# Patient Record
Sex: Male | Born: 1980
Health system: Southern US, Community
[De-identification: ages and names within clinical notes are randomized; demographics above are authoritative.]

## PROBLEM LIST (undated history)

## (undated) DIAGNOSIS — C801 Malignant (primary) neoplasm, unspecified: Secondary | ICD-10-CM

## (undated) DIAGNOSIS — M1990 Unspecified osteoarthritis, unspecified site: Secondary | ICD-10-CM

## (undated) DIAGNOSIS — I509 Heart failure, unspecified: Secondary | ICD-10-CM

## (undated) DIAGNOSIS — K219 Gastro-esophageal reflux disease without esophagitis: Secondary | ICD-10-CM

## (undated) DIAGNOSIS — Q2112 Patent foramen ovale: Secondary | ICD-10-CM

## (undated) DIAGNOSIS — E662 Morbid (severe) obesity with alveolar hypoventilation: Secondary | ICD-10-CM

## (undated) DIAGNOSIS — G4733 Obstructive sleep apnea (adult) (pediatric): Secondary | ICD-10-CM

## (undated) DIAGNOSIS — G4730 Sleep apnea, unspecified: Secondary | ICD-10-CM

## (undated) DIAGNOSIS — N189 Chronic kidney disease, unspecified: Secondary | ICD-10-CM

## (undated) DIAGNOSIS — I10 Essential (primary) hypertension: Secondary | ICD-10-CM

## (undated) DIAGNOSIS — J189 Pneumonia, unspecified organism: Secondary | ICD-10-CM

## (undated) HISTORY — DX: Gastro-esophageal reflux disease without esophagitis: K21.9

## (undated) HISTORY — PX: RADIOFREQUENCY ABLATION KIDNEY: SHX2292

## (undated) HISTORY — DX: Sleep apnea, unspecified: G47.30

## (undated) HISTORY — DX: Essential (primary) hypertension: I10

## (undated) HISTORY — DX: Morbid (severe) obesity with alveolar hypoventilation: E66.2

## (undated) HISTORY — PX: CARDIAC CATHETERIZATION: SHX172

## (undated) HISTORY — DX: Obstructive sleep apnea (adult) (pediatric): G47.33

---
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
## 2000-06-03 ENCOUNTER — Emergency Department (HOSPITAL_COMMUNITY): Admission: EM | Admit: 2000-06-03 | Discharge: 2000-06-03 | Payer: Self-pay | Admitting: Emergency Medicine

## 2007-01-25 ENCOUNTER — Emergency Department (HOSPITAL_COMMUNITY): Admission: EM | Admit: 2007-01-25 | Discharge: 2007-01-25 | Payer: Self-pay | Admitting: Family Medicine

## 2007-07-21 ENCOUNTER — Emergency Department (HOSPITAL_COMMUNITY): Admission: EM | Admit: 2007-07-21 | Discharge: 2007-07-21 | Payer: Self-pay | Admitting: Family Medicine

## 2007-09-23 ENCOUNTER — Emergency Department (HOSPITAL_COMMUNITY): Admission: EM | Admit: 2007-09-23 | Discharge: 2007-09-23 | Payer: Self-pay | Admitting: Emergency Medicine

## 2007-09-23 IMAGING — CT CT HEAD W/O CM
1 series · 16 of 30 positions shown, 20 images · non-contrast
Comparison: None.

CLINICAL DATA: Left periorbital and frontal headaches. Hypertension.

CRANIAL CT WITHOUT CONTRAST  [DATE]:
TECHNIQUE: 5mm axial images were obtained from the skull base through the
vertex without intravenous contrast.

[Series 2: headseq 4.8 h45s · axial · 0.45mm/px · z∈[-189,-35]mm · 16 of 36 slices shown, 20 images]
[im 2/36  brain]
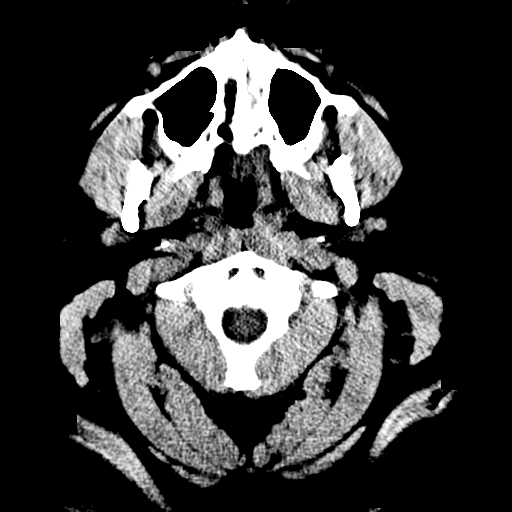
[im 2/36  bone]
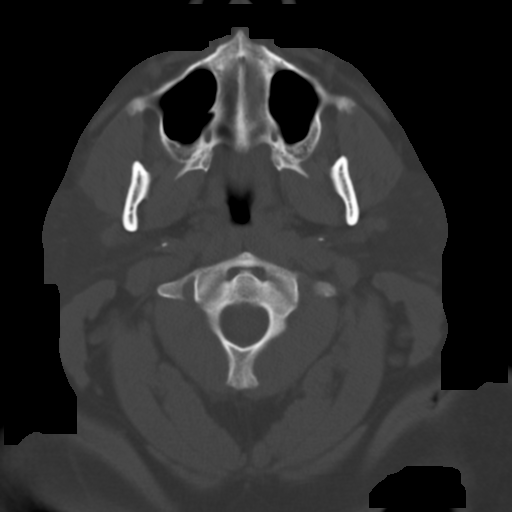
[im 4/36  brain]
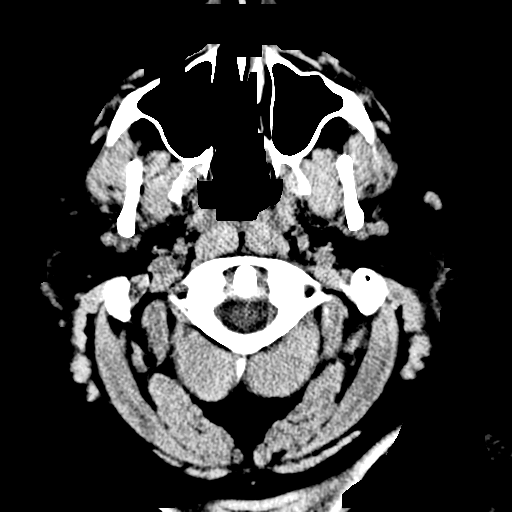
[im 7/36  brain]
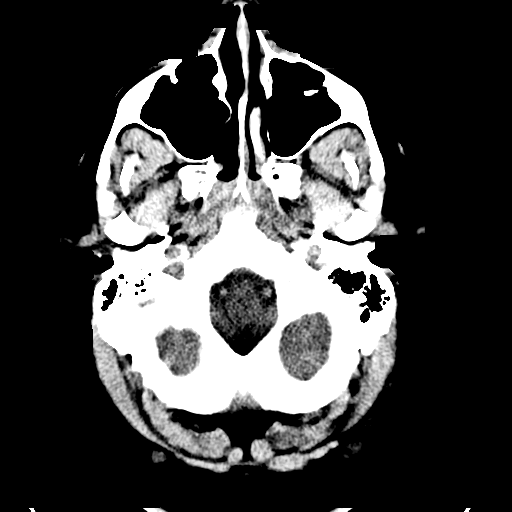
[im 9/36  brain]
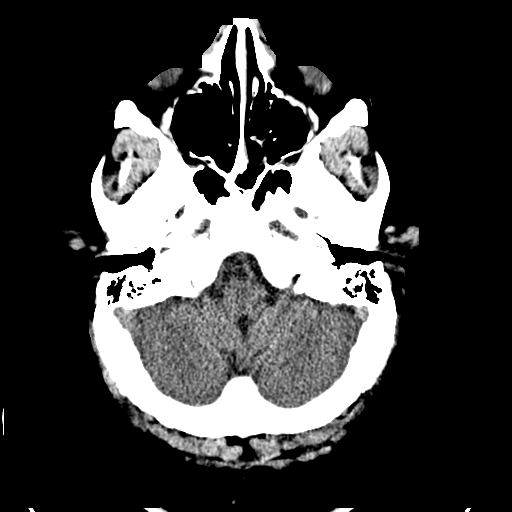
[im 10/36  brain]
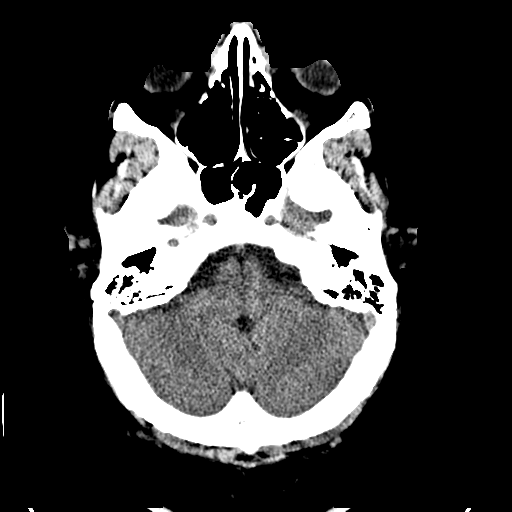
[im 10/36  bone]
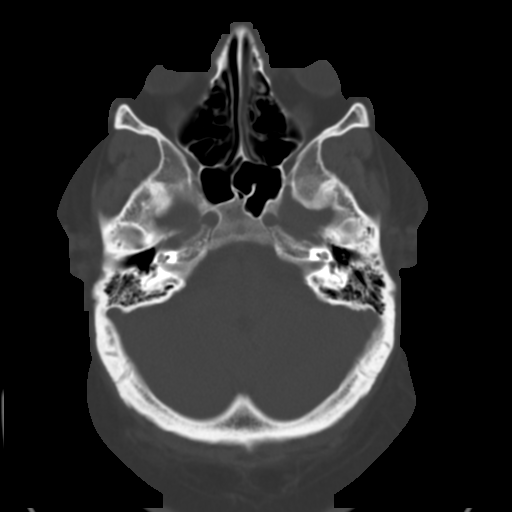
[im 13/36  brain]
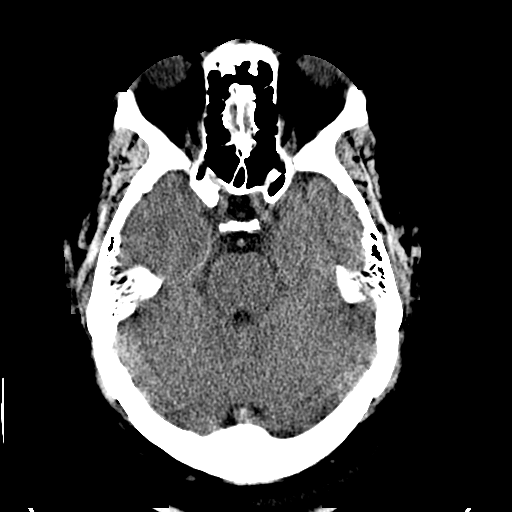
[im 15/36  brain]
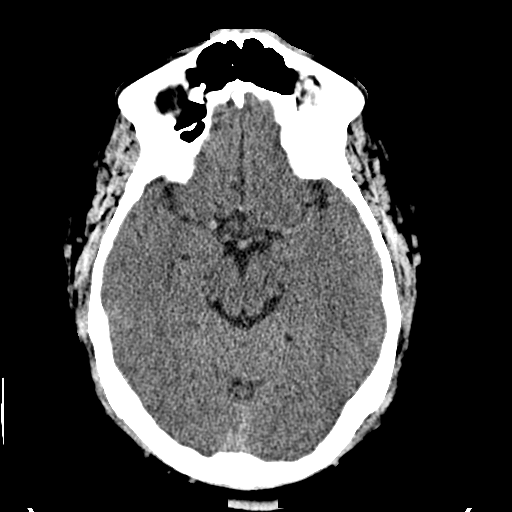
[im 17/36  brain]
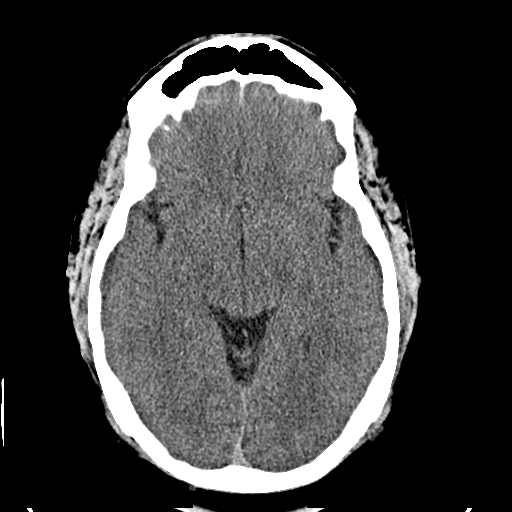
[im 19/36  brain]
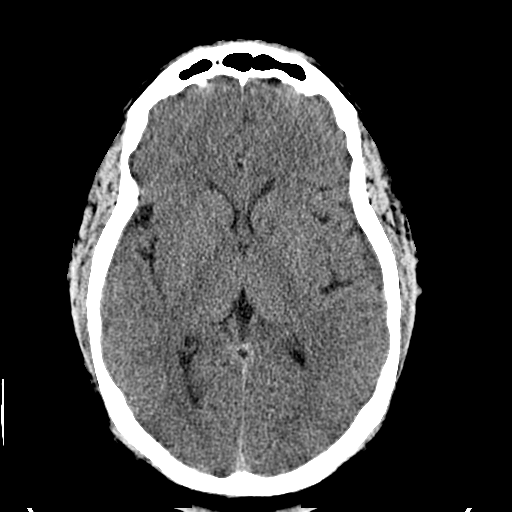
[im 19/36  bone]
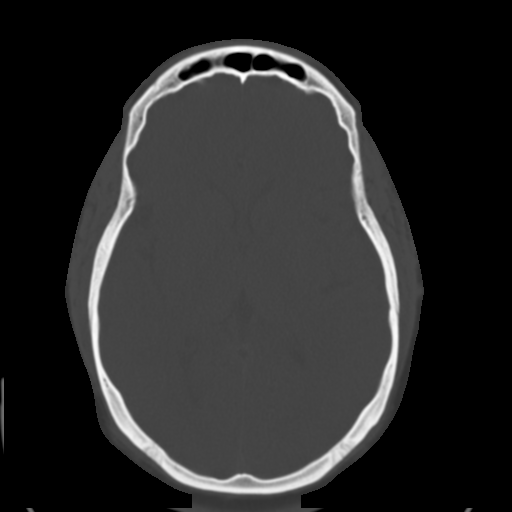
[im 21/36  brain]
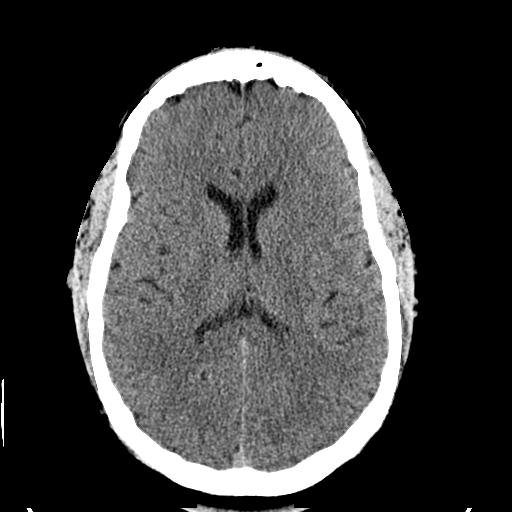
[im 23/36  brain]
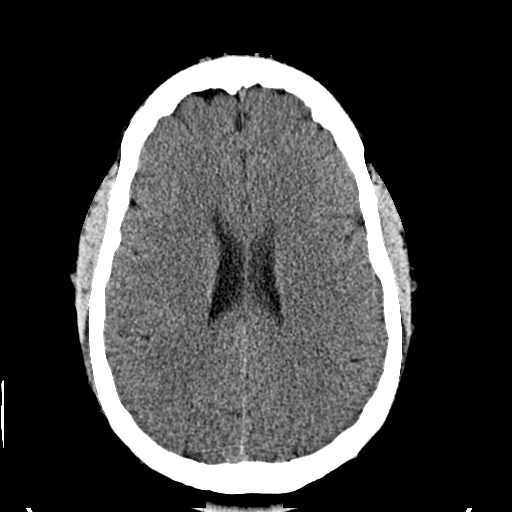
[im 26/36  brain]
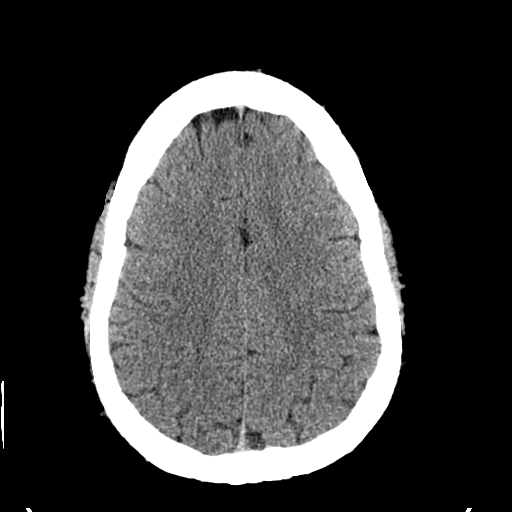
[im 27/36  brain]
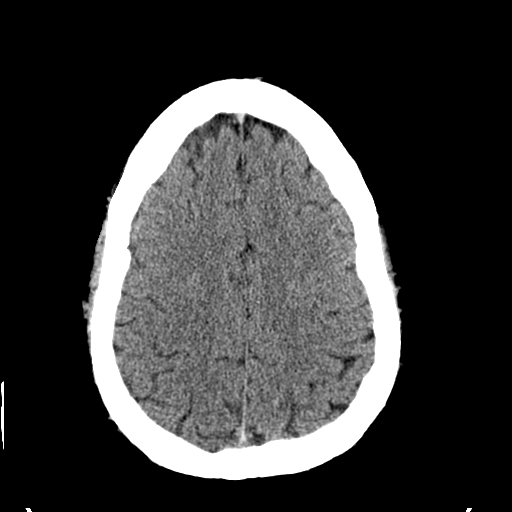
[im 27/36  bone]
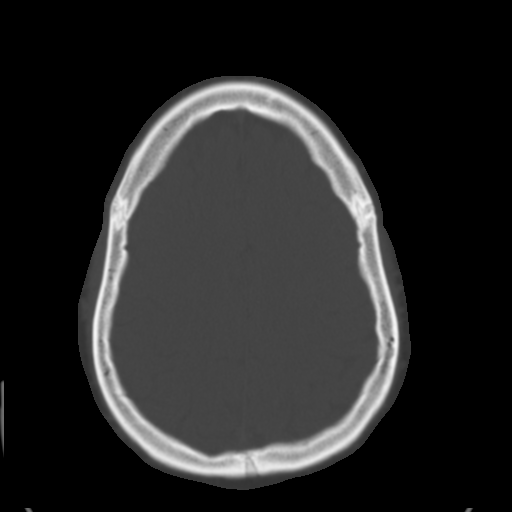
[im 29/36  brain]
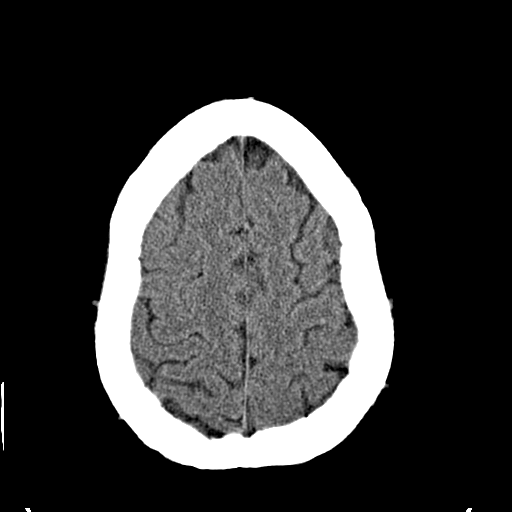
[im 32/36  brain]
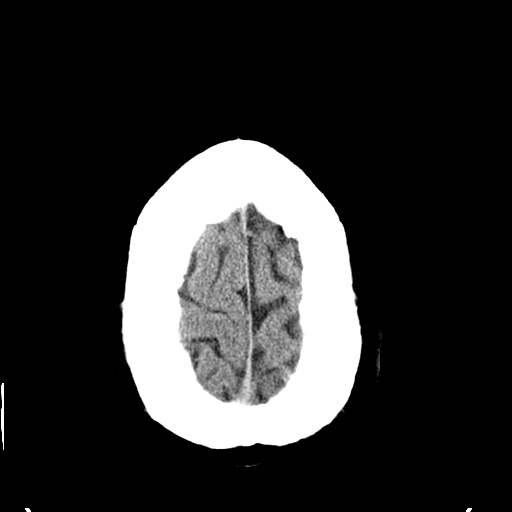
[im 34/36  brain]
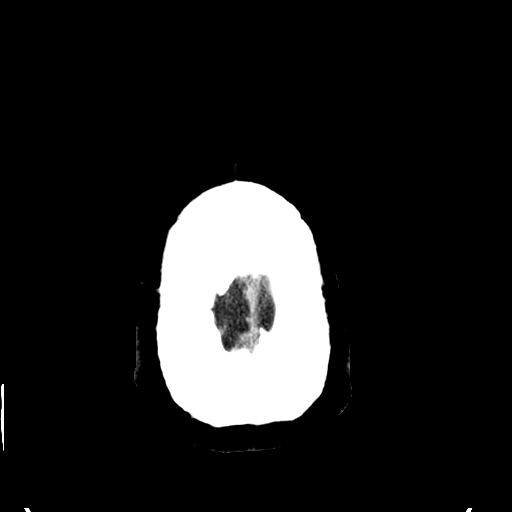

[16 of 30 positions shown; findings below may reference images not displayed]

FINDINGS: Ventricular system normal in size and appearance for age. No
intra-axial mass lesions or midline shift. No acute hemorrhage or hematoma. No
extra-axial fluid collections. No evidence of acute stroke or other focal brain
parenchymal abnormalities.

Approximately 14 mm calcified meningioma involving the inner table of the left
frontal bone near the vertex. No focal osseous abnormalities involving the
skull. Visualized paranasal sinuses and mastoid air cells well-aerated.
IMPRESSION: 1. No acute or significant intracranial abnormalities.
2. Incidental calcified 14 mm left frontal meningioma without associated
mass-effect.

## 2008-09-27 ENCOUNTER — Emergency Department (HOSPITAL_COMMUNITY): Admission: EM | Admit: 2008-09-27 | Discharge: 2008-09-27 | Payer: Self-pay | Admitting: Family Medicine

## 2008-09-27 IMAGING — CR DG CHEST 2V
2 series · 2 of 2 positions shown · non-contrast
Comparison: None

CLINICAL DATA: Cough, fever and chills.

CHEST - 2 VIEW

[view not recorded (1 of 2)]
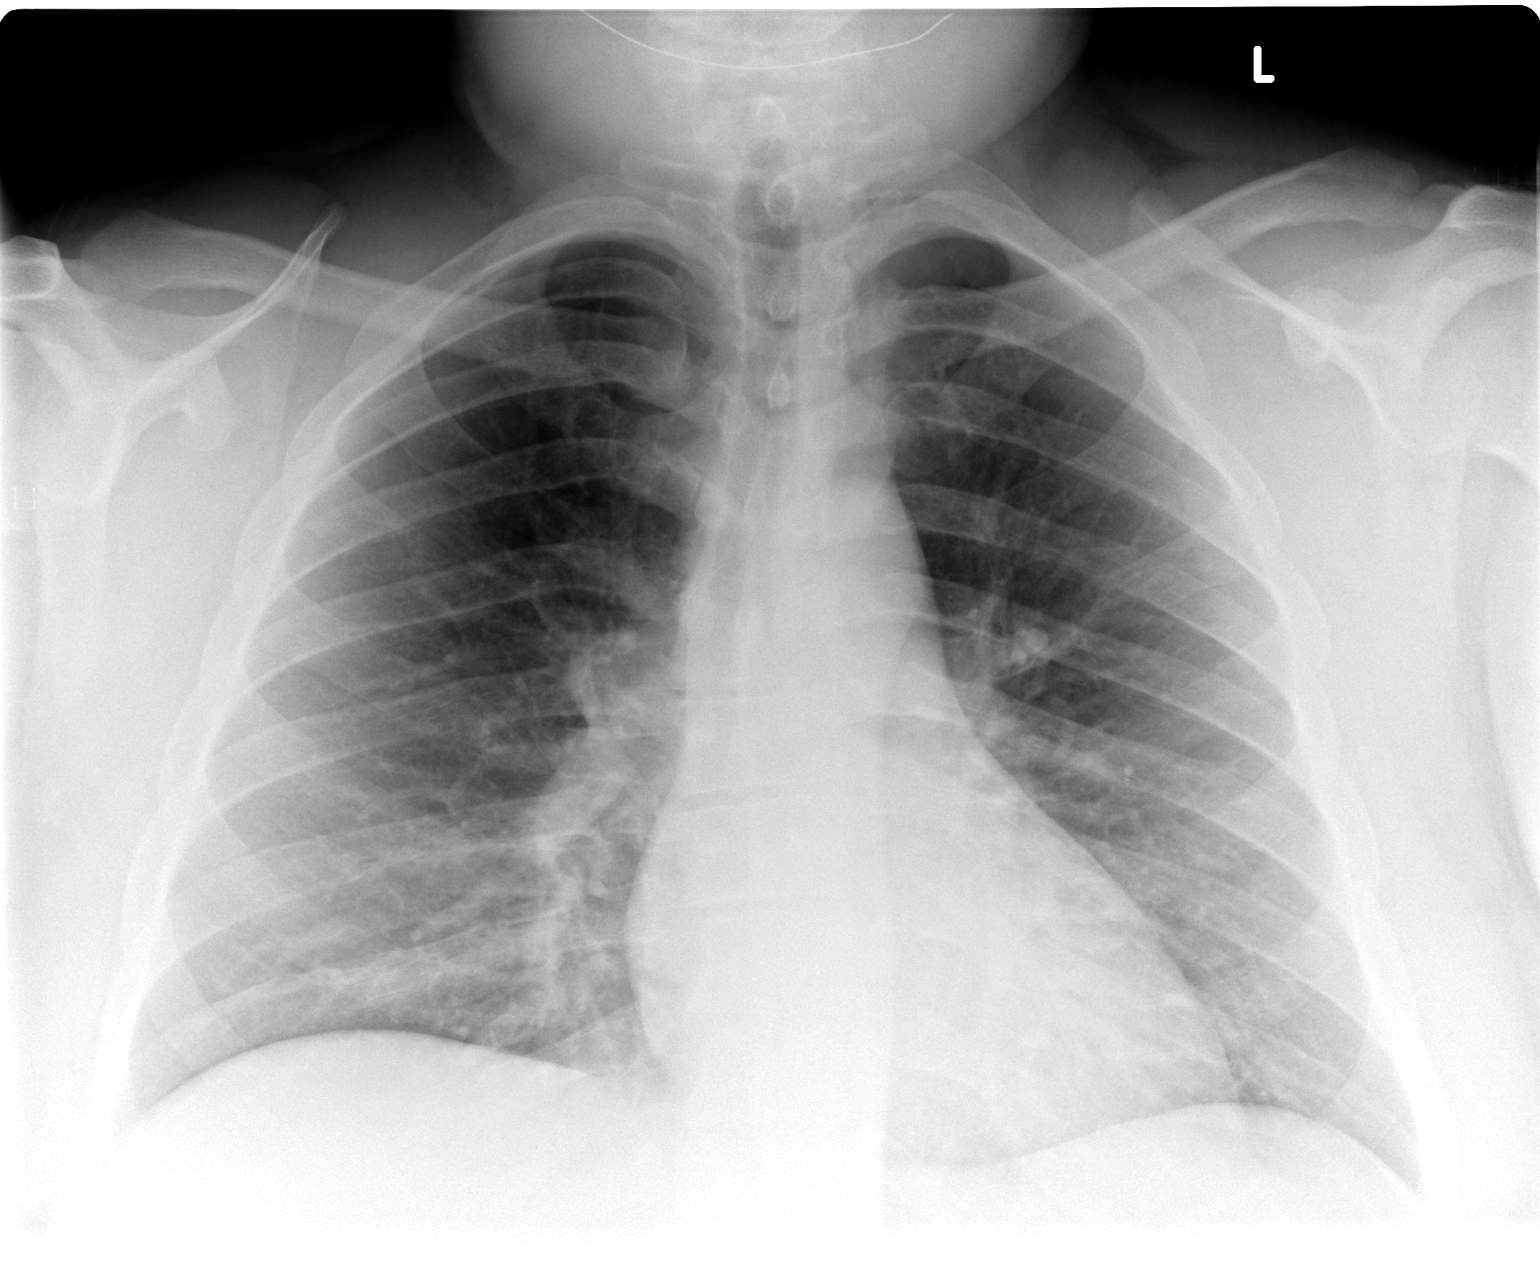

[view not recorded (2 of 2)]
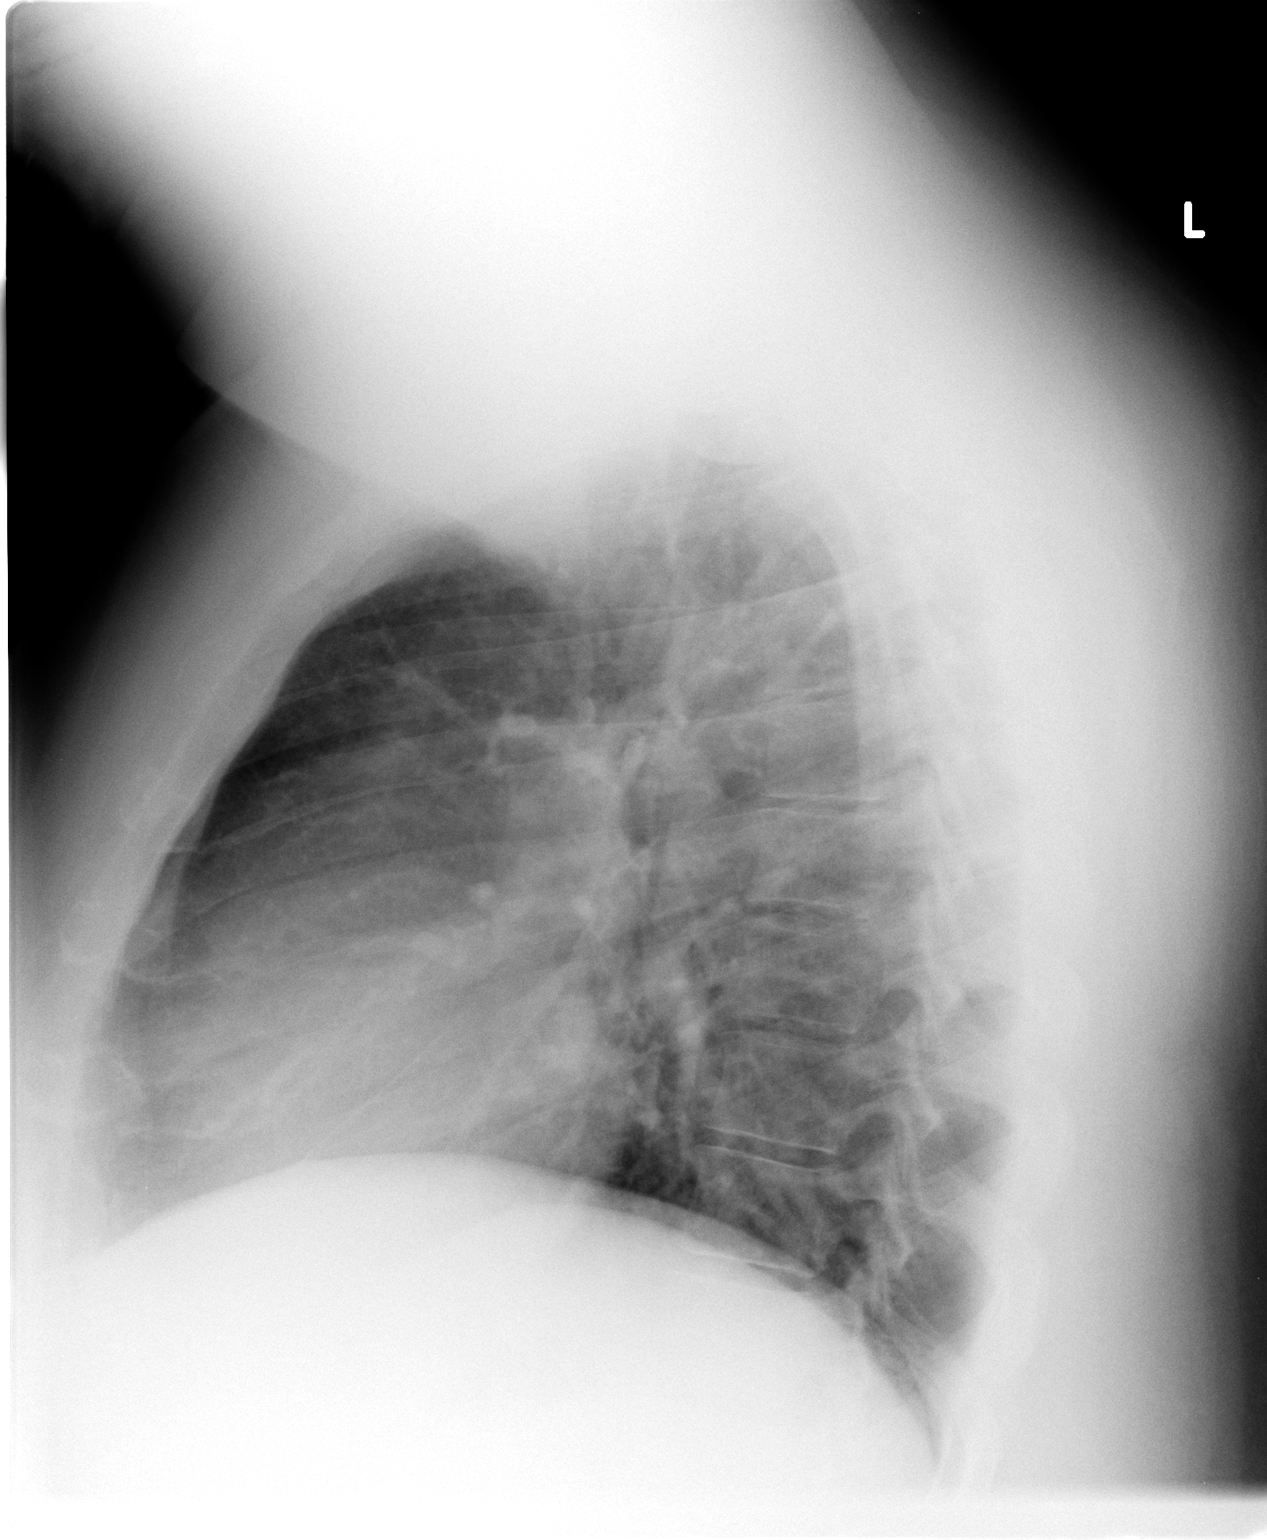

[2 of 2 positions shown; findings below may reference images not displayed]

FINDINGS: There is asymmetric density at the right lung base
representing either atelectasis or infiltrate.  No edema or pleural
effusion is identified.  Heart size is normal.
IMPRESSION: Right lower lobe atelectasis versus infiltrate.

## 2009-04-07 ENCOUNTER — Emergency Department (HOSPITAL_COMMUNITY): Admission: EM | Admit: 2009-04-07 | Discharge: 2009-04-07 | Payer: Self-pay | Admitting: Emergency Medicine

## 2009-07-21 ENCOUNTER — Ambulatory Visit: Payer: Self-pay | Admitting: Family Medicine

## 2009-07-21 DIAGNOSIS — R519 Headache, unspecified: Secondary | ICD-10-CM | POA: Insufficient documentation

## 2009-07-21 DIAGNOSIS — R51 Headache: Secondary | ICD-10-CM | POA: Insufficient documentation

## 2009-07-21 DIAGNOSIS — I10 Essential (primary) hypertension: Secondary | ICD-10-CM | POA: Insufficient documentation

## 2010-03-03 ENCOUNTER — Emergency Department (HOSPITAL_COMMUNITY): Admission: EM | Admit: 2010-03-03 | Discharge: 2010-03-03 | Payer: Self-pay | Admitting: Emergency Medicine

## 2010-03-03 IMAGING — CR DG CHEST 2V
2 series · 2 of 2 positions shown · non-contrast
Comparison: Chest x-ray of [DATE]

CLINICAL DATA: Cough, spitting up blood for several days, smoking
history

CHEST - 2 VIEW

[w chest pa]
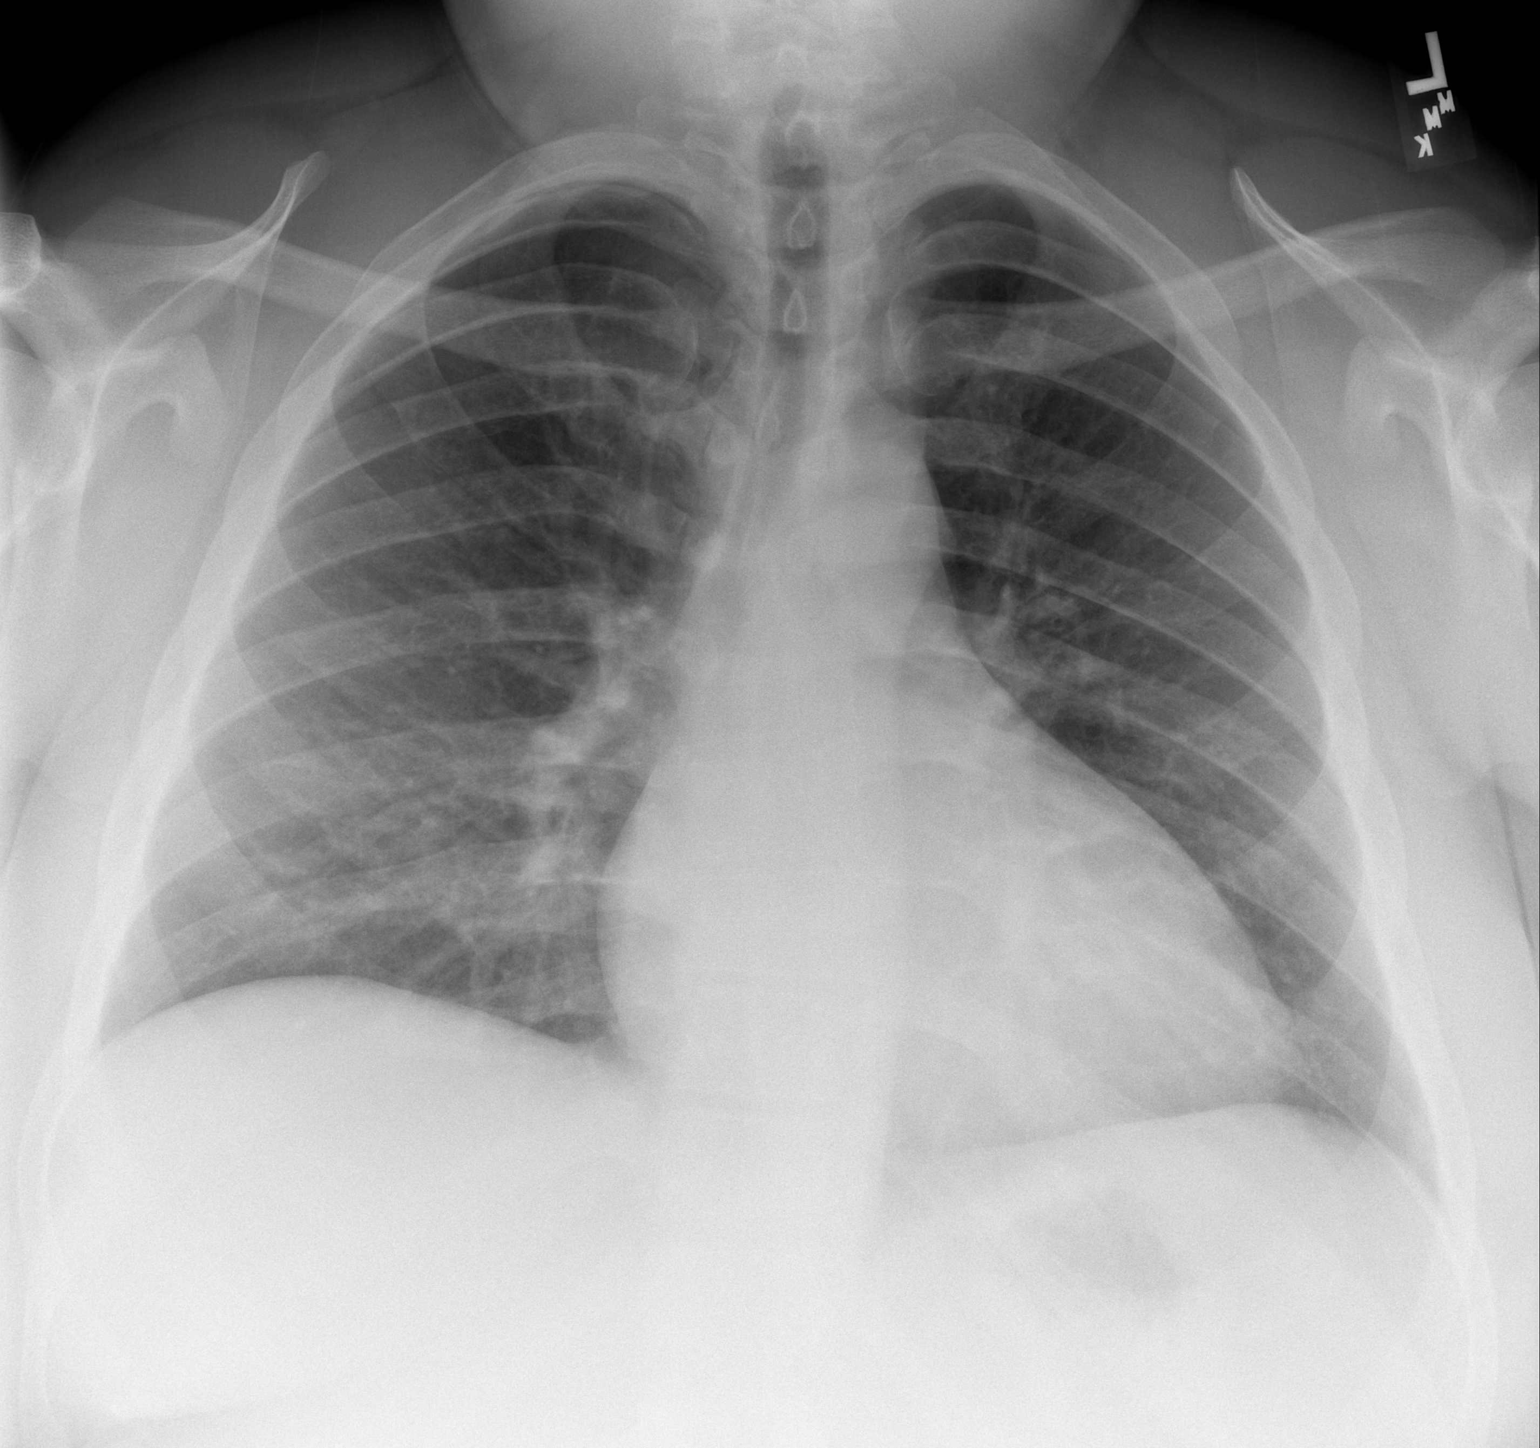

[w chest lat]
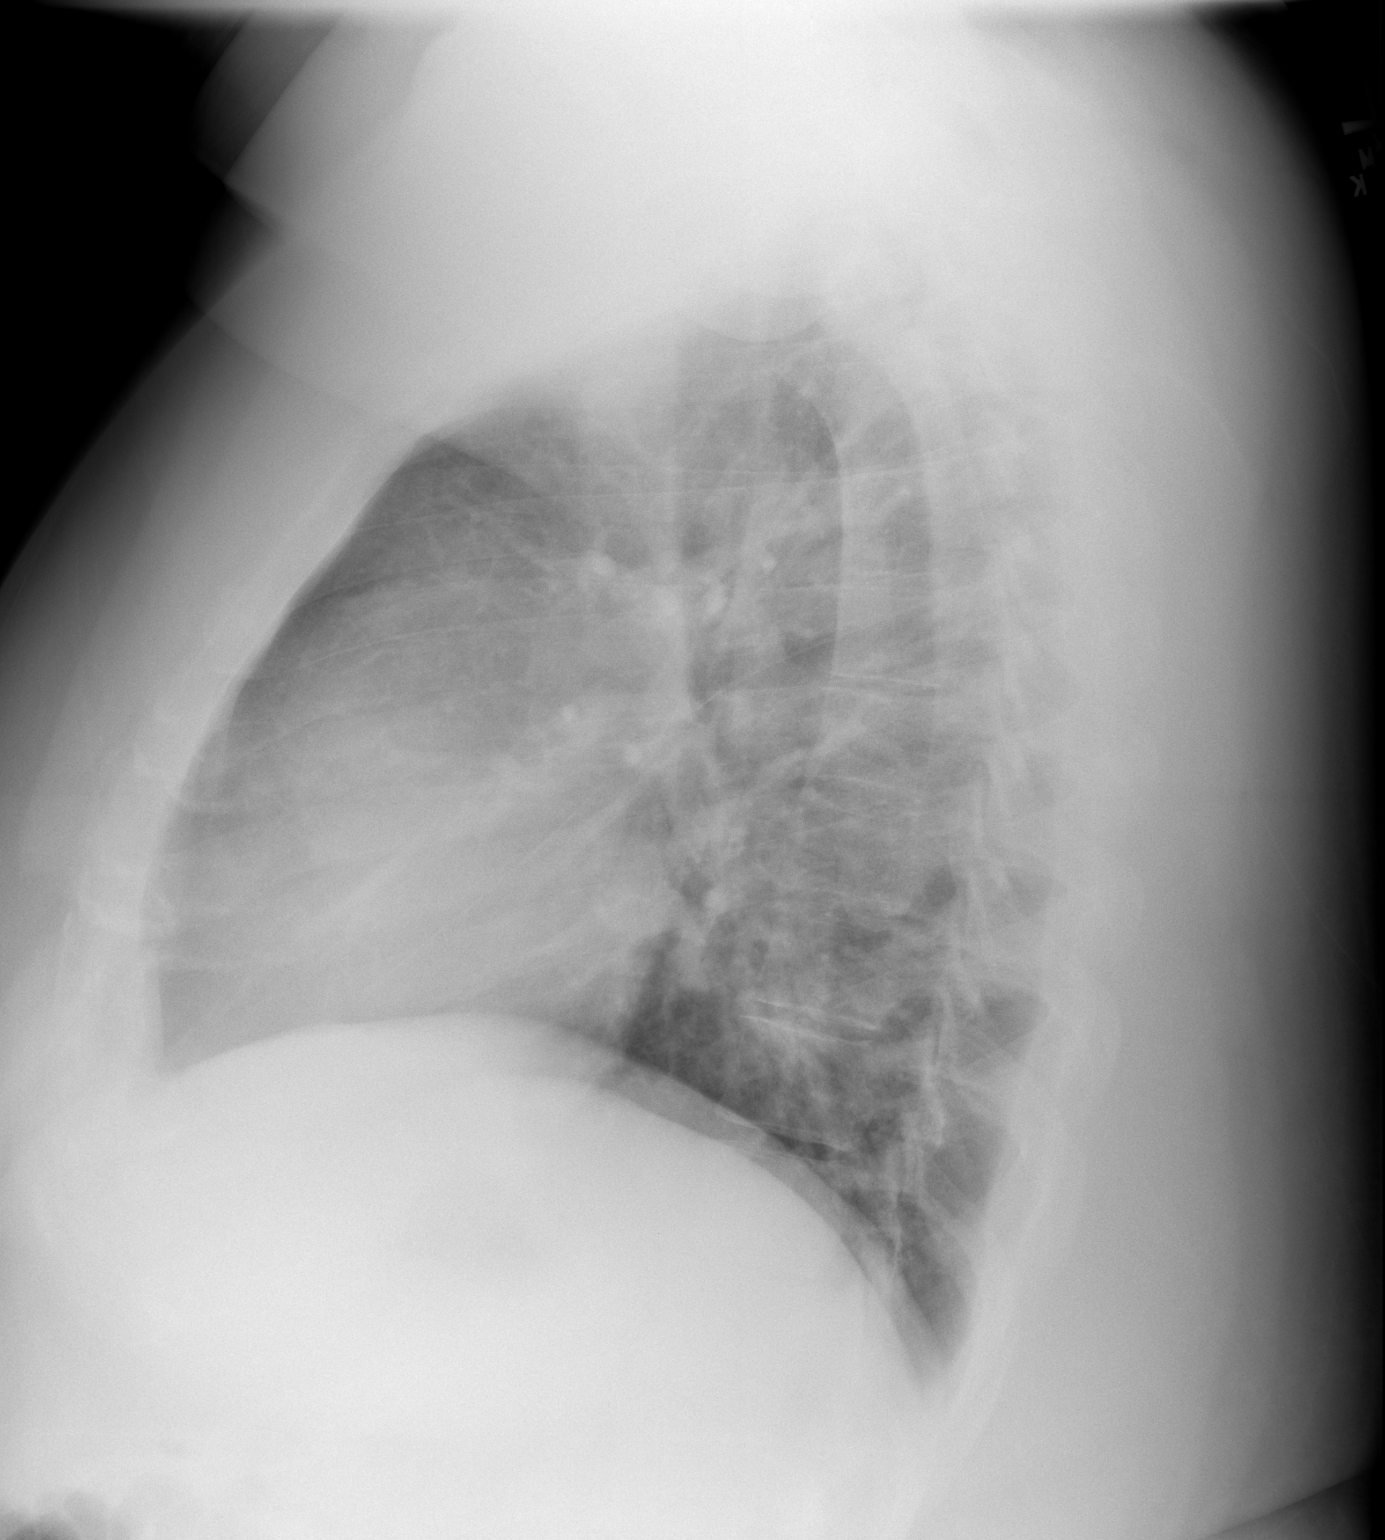

[2 of 2 positions shown; findings below may reference images not displayed]

FINDINGS: No active infiltrate or effusion is seen.  The heart is
within upper limits of normal.  No bony abnormality is seen.
IMPRESSION: No active lung disease.

## 2010-03-04 ENCOUNTER — Emergency Department (HOSPITAL_COMMUNITY): Admission: EM | Admit: 2010-03-04 | Discharge: 2010-03-04 | Payer: Self-pay | Admitting: Emergency Medicine

## 2010-03-04 IMAGING — CT CT ANGIO CHEST
2 of 6 series · 19 of 46 positions shown · IV contrast (APPLIED)
Comparison: Chest x-ray [DATE].

CLINICAL DATA: Coughing up blood.

CT ANGIOGRAPHY CHEST WITH CONTRAST
TECHNIQUE: Multidetector CT imaging of the chest was performed
using the standard protocol during bolus administration of
intravenous contrast.  Multiplanar CT image reconstructions
including MIPs were obtained to evaluate the vascular anatomy.
Contrast:  Omnipaque 350, 80 ml.

[Series 9: pe thins @ 1mm · axial · 0.76mm/px · z∈[-43,+235]mm · 16 of 306 slices shown]
[im 14/306  lung]
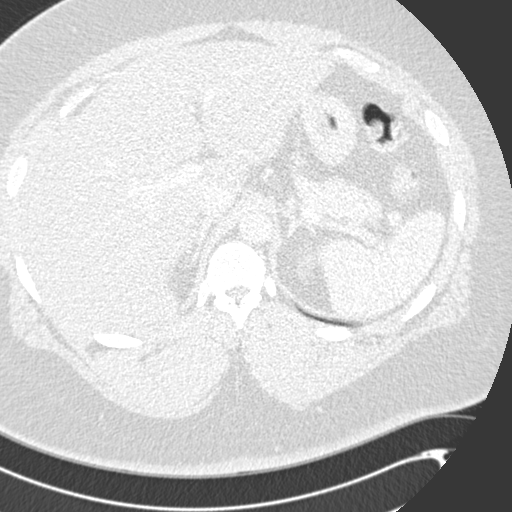
[im 40/306  soft-tissue]
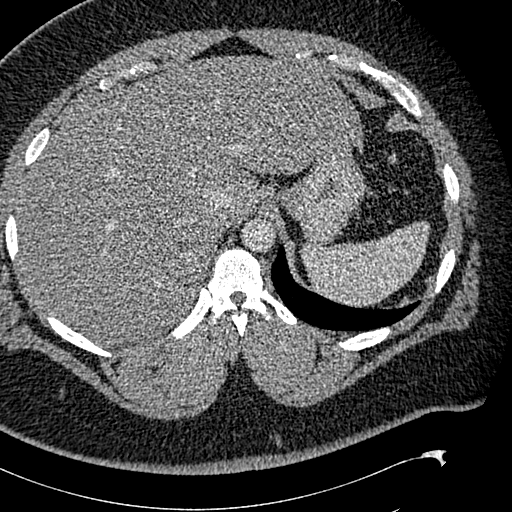
[im 54/306  lung]
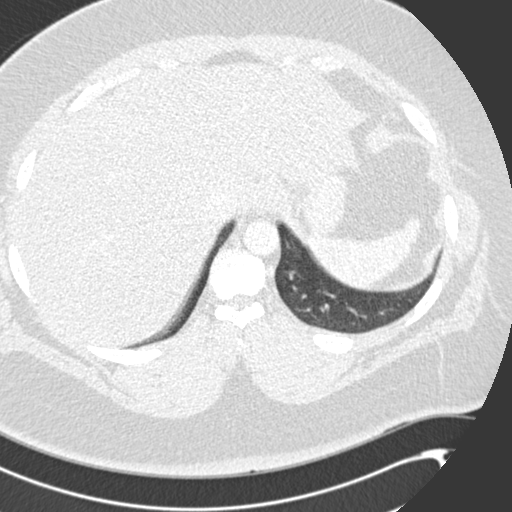
[im 67/306  soft-tissue]
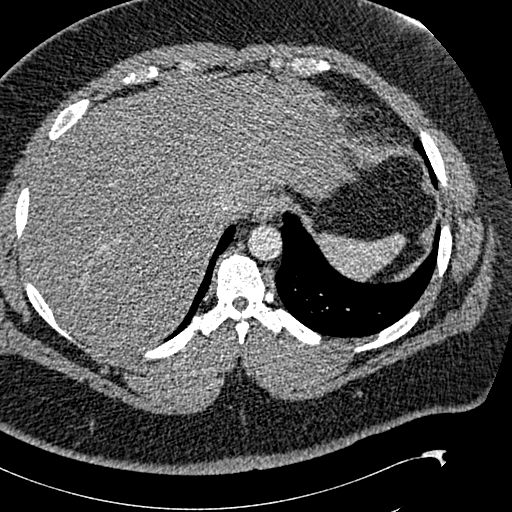
[im 93/306  lung]
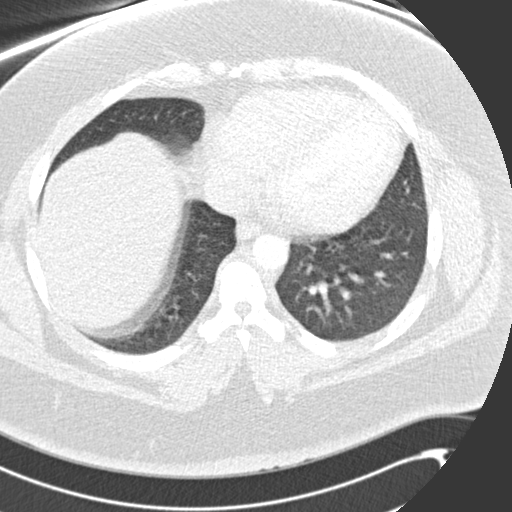
[im 107/306  soft-tissue]
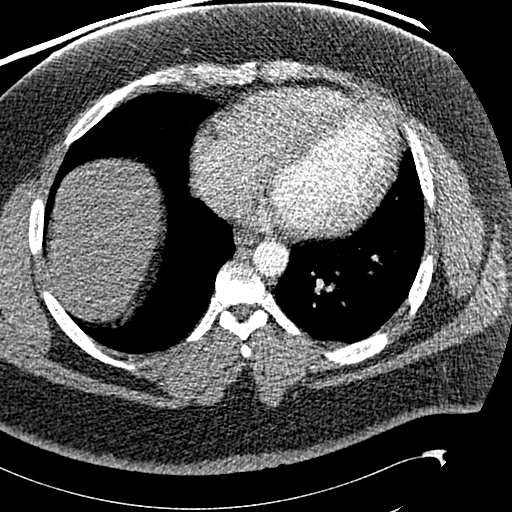
[im 120/306  lung]
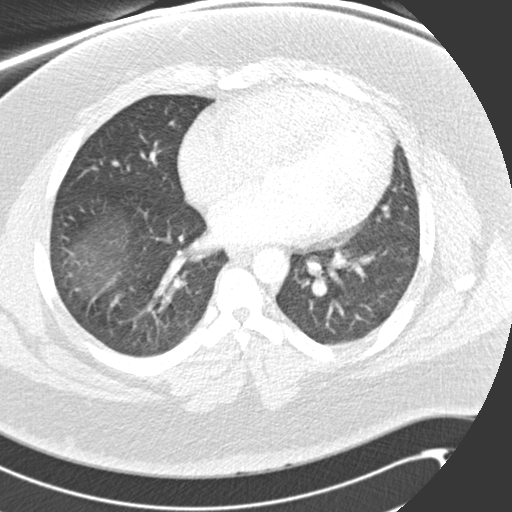
[im 146/306  soft-tissue]
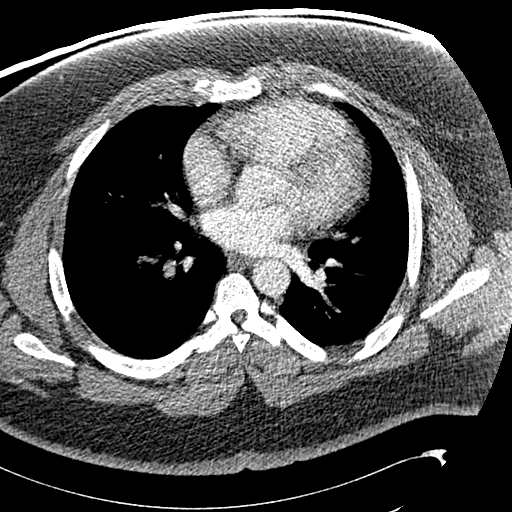
[im 160/306  lung]
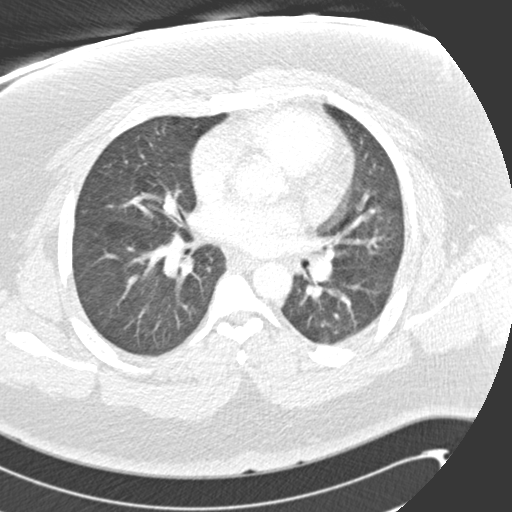
[im 186/306  soft-tissue]
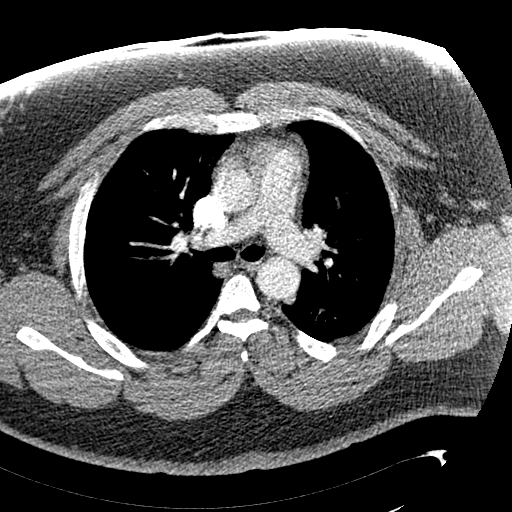
[im 199/306  lung]
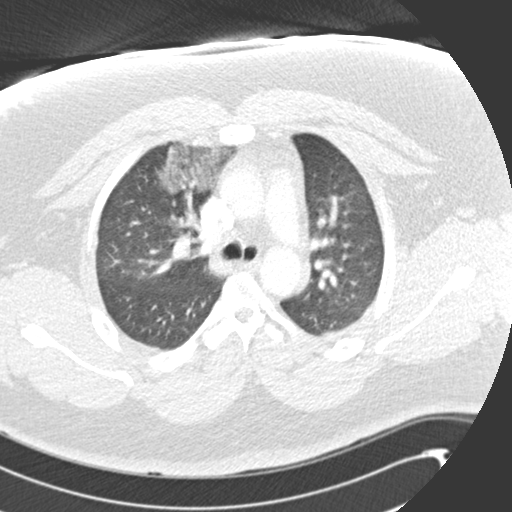
[im 213/306  soft-tissue]
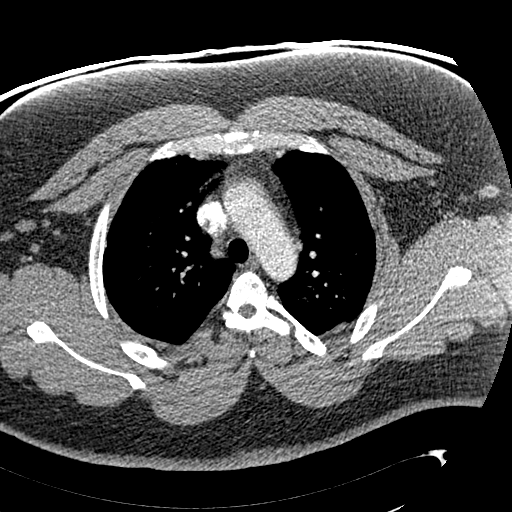
[im 239/306  lung]
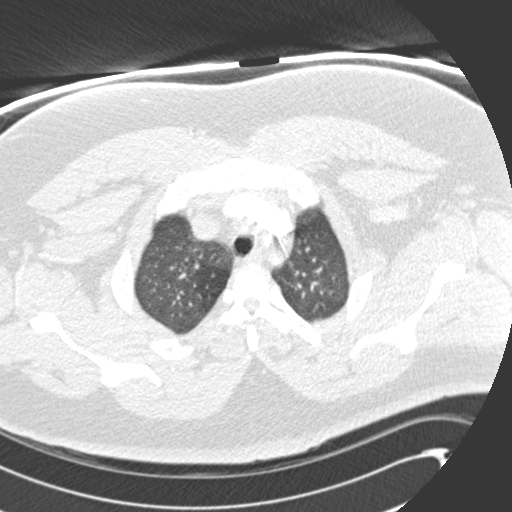
[im 252/306  soft-tissue]
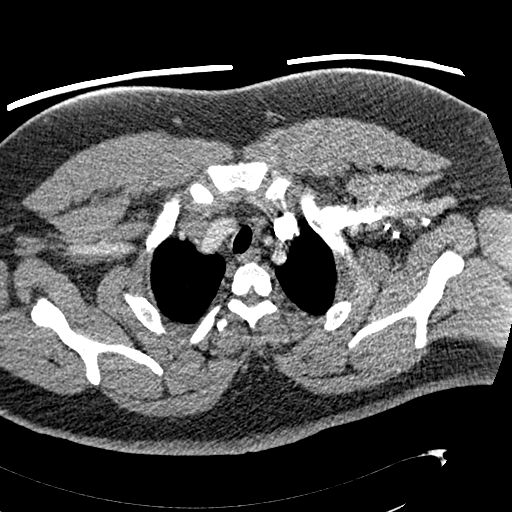
[im 266/306  lung]
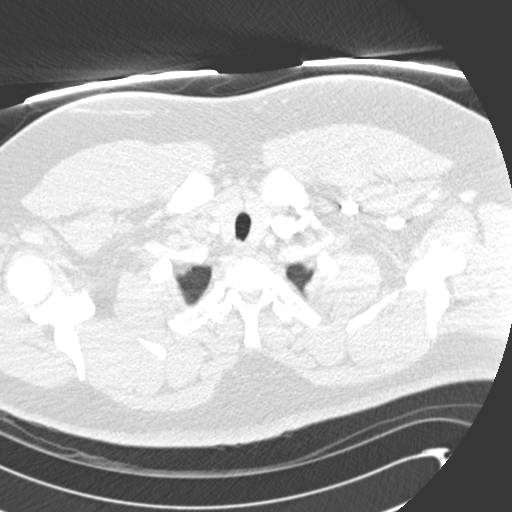
[im 292/306  soft-tissue]
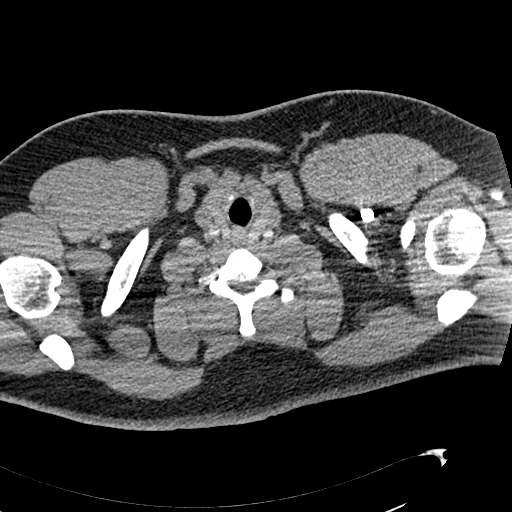

[Series 604: <mpr thick range> · coronal · 0.76mm/px · 3 of 116 slices shown]
[im 29/116  soft-tissue]
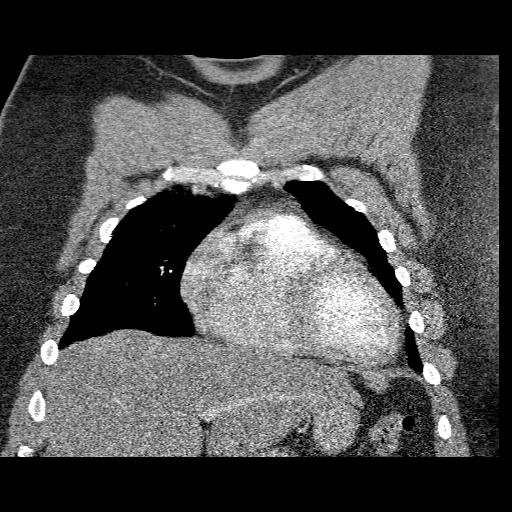
[im 58/116  soft-tissue]
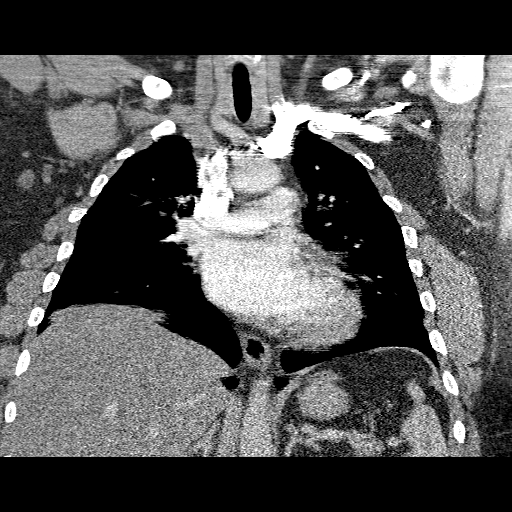
[im 87/116  soft-tissue]
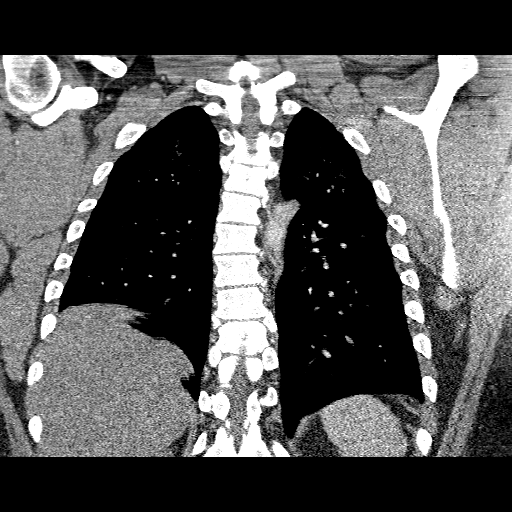

[19 of 46 positions shown; findings below may reference images not displayed]

FINDINGS: There is a right upper lobe medial airspace opacity
consistent with pneumonia.  No other infiltrates or nodules.
Trachea midline.  No endobronchial abnormality.

Limited visualization of the pulmonary vasculature due to the
patient's size.  No gross central pulmonary embolus.  Normal
cardiac size. Arch and great vessels grossly unremarkable.

No visible axillary, hilar, or mediastinal lymph nodes.  No pleural
or pericardial effusion.  The visualized osseous structures are
unremarkable. No visualized neck masses.

Below the diaphragm, fatty liver without focal defects observed.
No visualized masses or inflammation.

Review of the MIP images confirms the above findings.
IMPRESSION: Limited study due to the patient's body habitus.  No visualized
pulmonary embolic disease.

Right upper lobe airspace opacity consistent with pneumonia.  This
is not visualized, even in retrospect, on the previous chest x-ray.

## 2010-03-07 ENCOUNTER — Emergency Department (HOSPITAL_COMMUNITY): Admission: EM | Admit: 2010-03-07 | Discharge: 2010-03-07 | Payer: Self-pay | Admitting: Emergency Medicine

## 2010-03-25 ENCOUNTER — Encounter: Payer: Self-pay | Admitting: Pulmonary Disease

## 2010-03-25 DIAGNOSIS — R042 Hemoptysis: Secondary | ICD-10-CM | POA: Insufficient documentation

## 2010-03-26 ENCOUNTER — Telehealth: Payer: Self-pay | Admitting: Pulmonary Disease

## 2010-05-14 ENCOUNTER — Ambulatory Visit: Payer: Self-pay | Admitting: Pulmonary Disease

## 2010-05-15 ENCOUNTER — Telehealth: Payer: Self-pay | Admitting: Pulmonary Disease

## 2010-10-15 ENCOUNTER — Emergency Department (HOSPITAL_COMMUNITY)
Admission: EM | Admit: 2010-10-15 | Discharge: 2010-10-16 | Payer: Self-pay | Source: Home / Self Care | Admitting: Emergency Medicine

## 2010-10-19 LAB — COMPREHENSIVE METABOLIC PANEL
ALT: 42 U/L (ref 0–53)
Calcium: 9.4 mg/dL (ref 8.4–10.5)
Creatinine, Ser: 1.09 mg/dL (ref 0.4–1.5)
GFR calc Af Amer: >60 mL/min (ref >60)
GFR calc non Af Amer: >60 mL/min (ref >60)
Glucose, Bld: 120 mg/dL — ABNORMAL HIGH (ref 70–99)
Total Bilirubin: 0.5 mg/dL (ref 0.3–1.2)
Total Protein: 8 g/dL (ref 6.0–8.3)

## 2010-10-19 LAB — DIFFERENTIAL
Basophils Absolute: 0 10*3/uL (ref 0.0–0.1)
Eosinophils Absolute: 0.4 10*3/uL (ref 0.0–0.7)
Eosinophils Relative: 4 % (ref 0–5)
Lymphs Abs: 3 10*3/uL (ref 0.7–4.0)
Monocytes Absolute: 0.8 10*3/uL (ref 0.1–1.0)
Monocytes Relative: 7 % (ref 3–12)

## 2010-10-19 LAB — CBC
MCH: 30.1 pg (ref 26.0–34.0)
MCHC: 35.4 g/dL (ref 30.0–36.0)
MCV: 85 fL (ref 78.0–100.0)
WBC: 11.1 10*3/uL — ABNORMAL HIGH (ref 4.0–10.5)

## 2010-10-19 LAB — POCT CARDIAC MARKERS: CKMB, poc: <1 ng/mL — ABNORMAL LOW (ref 1.0–8.0)

## 2010-10-19 LAB — LIPASE, BLOOD: Lipase: 22 U/L (ref 11–59)

## 2010-10-27 NOTE — Progress Notes (Signed)
Summary: nos appt  Phone Note Call from Patient   Caller: [email or web address]  Call For: Ka Bench Summary of Call: LMTCB to rsc nos from 6/29. Initial call taken by: Netta Neat,  March 26, 2010 3:36 PM

## 2010-10-27 NOTE — Progress Notes (Signed)
Summary: nos appt  Phone Note Call from Patient   Caller: [email or web address]  Call For: Calvin Bailey Summary of Call: LMTCB x2 to rsc nos from 8/18. Initial call taken by: Netta Neat,  May 15, 2010 3:09 PM

## 2010-10-27 NOTE — Miscellaneous (Signed)
Summary: Orders Update  Clinical Lists Changes  Orders: Added new Service order of No Show NS50 (NS50) - Signed 

## 2010-10-27 NOTE — Miscellaneous (Signed)
Summary: Orders Update  Clinical Lists Changes  Problems: Added new problem of HEMOPTYSIS UNSPECIFIED (ICD-786.30) Orders: Added new Service order of No Show NS50 (NS50) - Signed

## 2010-11-05 IMAGING — CR DG CHEST 2V
2 series · 2 of 2 positions shown · non-contrast
Comparison: [DATE]

CLINICAL DATA: Chest pain.  Smoker.  High blood pressure.

CHEST - 2 VIEW

[w chest pa *]
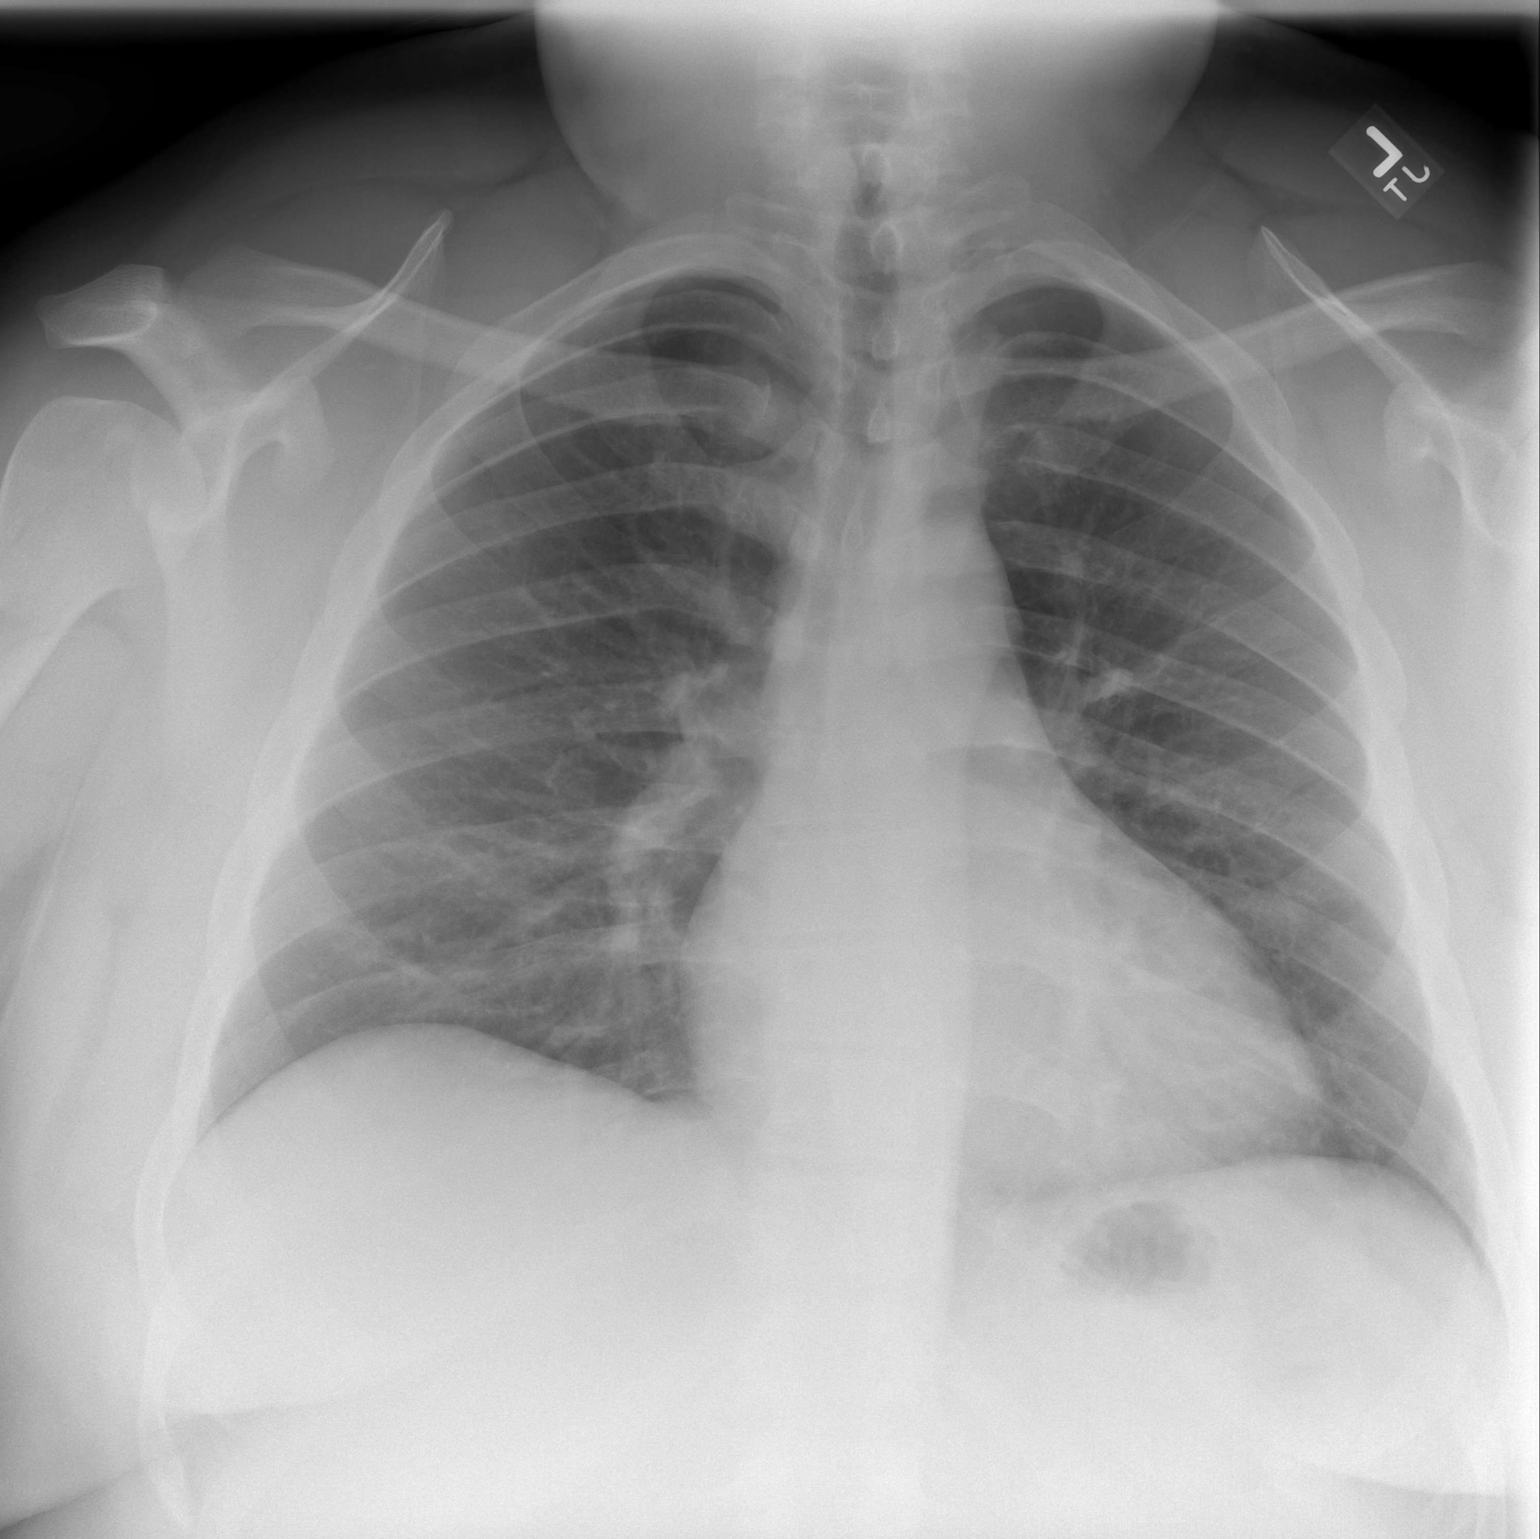

[w chest lat *]
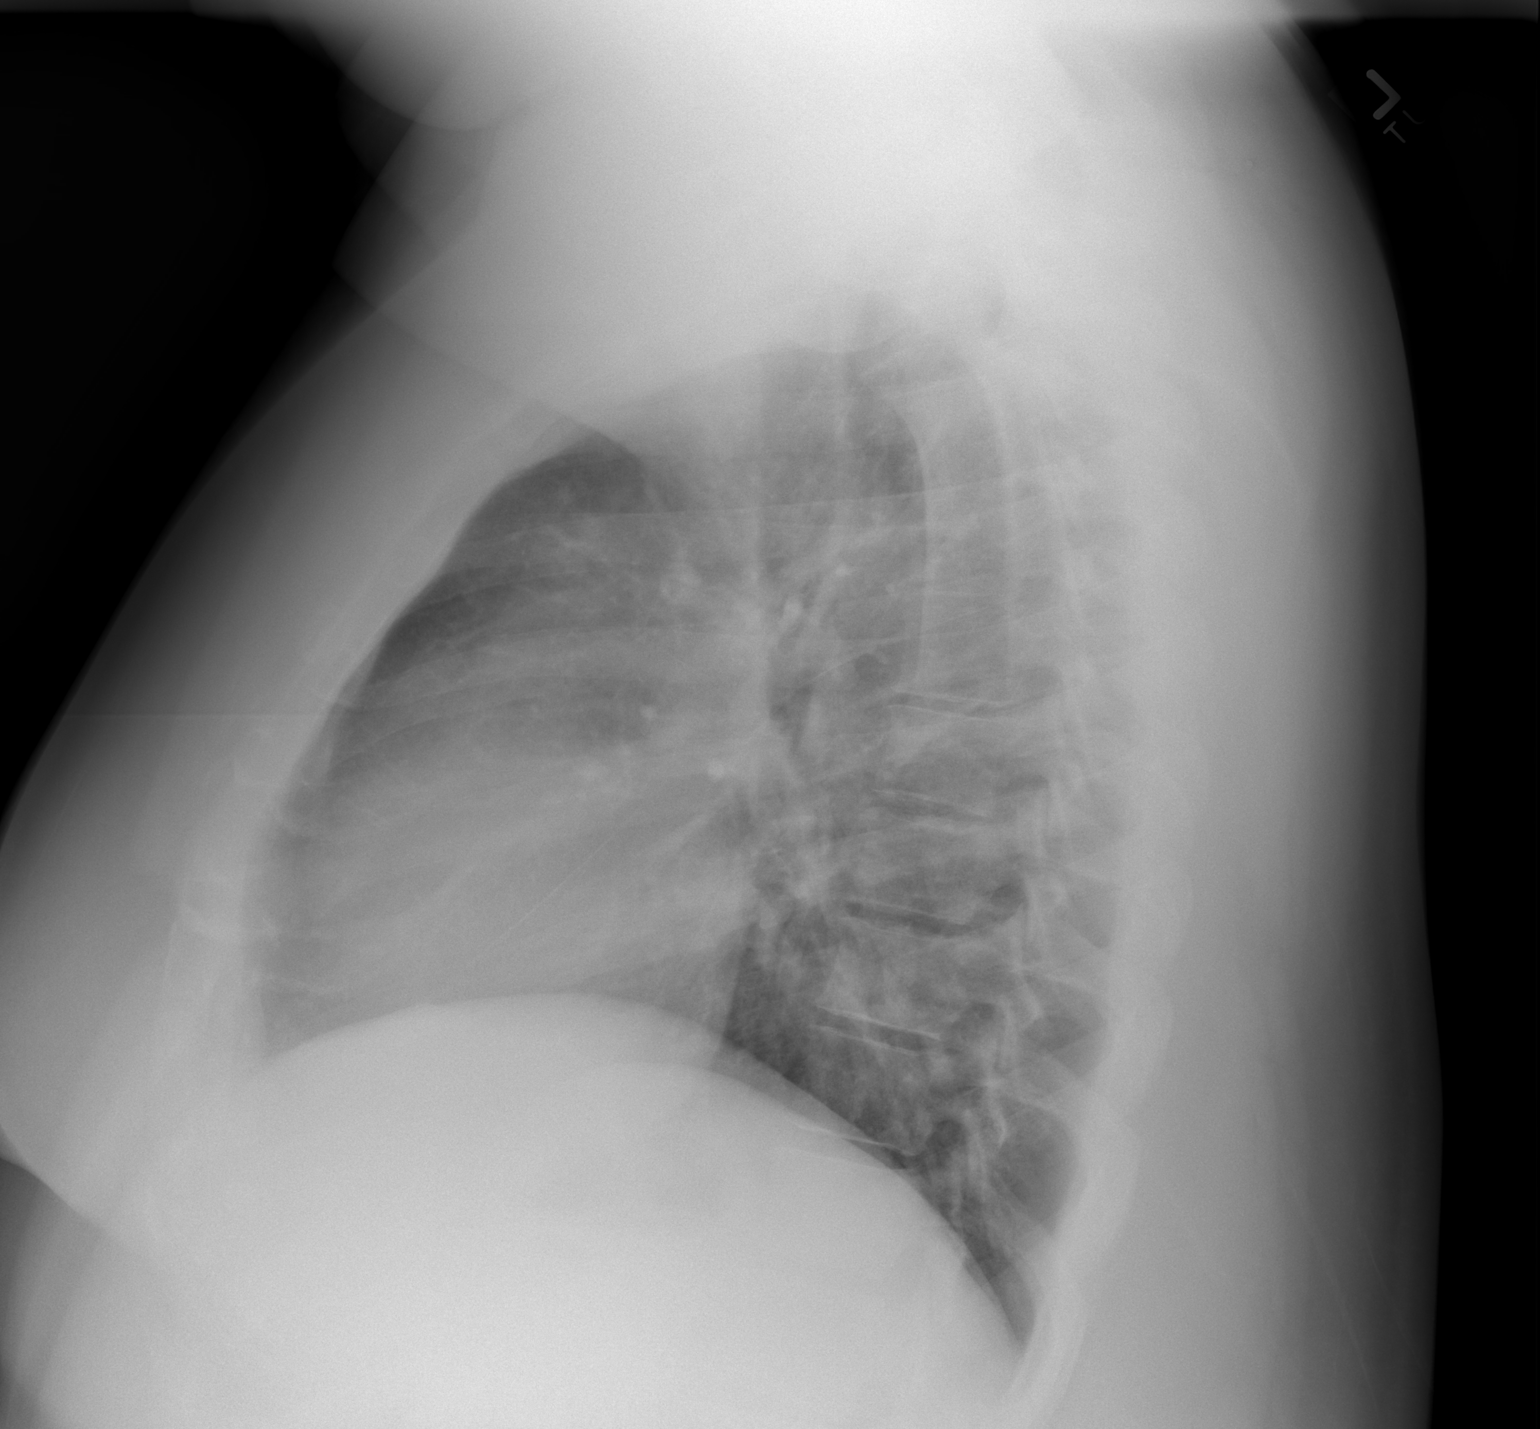

[2 of 2 positions shown; findings below may reference images not displayed]

FINDINGS: Midline trachea. Borderline cardiomegaly.     Mediastinal
contours otherwise within normal limits.  Mild right hemidiaphragm
elevation. No pleural effusion or pneumothorax. Clear lungs.
IMPRESSION: No acute cardiopulmonary disease.

## 2010-12-14 LAB — CBC
HCT: 43.8 % (ref 39.0–52.0)
Hemoglobin: 15.1 g/dL (ref 13.0–17.0)
MCV: 88.4 fL (ref 78.0–100.0)
RBC: 4.95 MIL/uL (ref 4.22–5.81)
RDW: 12 % (ref 11.5–15.5)
WBC: 9.7 10*3/uL (ref 4.0–10.5)

## 2010-12-14 LAB — COMPREHENSIVE METABOLIC PANEL
ALT: 35 U/L (ref 0–53)
Alkaline Phosphatase: 76 U/L (ref 39–117)
BUN: 9 mg/dL (ref 6–23)
CO2: 28 mEq/L (ref 19–32)
Glucose, Bld: 99 mg/dL (ref 70–99)
Total Bilirubin: 0.7 mg/dL (ref 0.3–1.2)

## 2010-12-14 LAB — PROTIME-INR
INR: 1 (ref 0.00–1.49)
Prothrombin Time: 13.1 seconds (ref 11.6–15.2)
Prothrombin Time: 13.9 seconds (ref 11.6–15.2)

## 2010-12-14 LAB — POCT I-STAT, CHEM 8
BUN: 13 mg/dL (ref 6–23)
Calcium, Ion: 1.23 mmol/L (ref 1.12–1.32)
Chloride: 105 mEq/L (ref 96–112)
Creatinine, Ser: 1.1 mg/dL (ref 0.4–1.5)
Hemoglobin: 16 g/dL (ref 13.0–17.0)
Potassium: 4 mEq/L (ref 3.5–5.1)
Sodium: 140 mEq/L (ref 135–145)
TCO2: 27 mmol/L (ref 0–100)

## 2010-12-14 LAB — DIFFERENTIAL
Basophils Absolute: 0 10*3/uL (ref 0.0–0.1)
Basophils Relative: 0 % (ref 0–1)
Eosinophils Absolute: 0.3 10*3/uL (ref 0.0–0.7)
Eosinophils Relative: 3 % (ref 0–5)
Lymphs Abs: 2 10*3/uL (ref 0.7–4.0)

## 2011-02-09 ENCOUNTER — Encounter: Payer: Self-pay | Admitting: Family Medicine

## 2011-02-09 ENCOUNTER — Ambulatory Visit (INDEPENDENT_AMBULATORY_CARE_PROVIDER_SITE_OTHER): Payer: Self-pay | Admitting: Family Medicine

## 2011-02-09 VITALS — BP 140/91 | HR 82 | Temp 98.3°F | Ht 74.0 in | Wt 397.5 lb

## 2011-02-09 DIAGNOSIS — G4730 Sleep apnea, unspecified: Secondary | ICD-10-CM | POA: Insufficient documentation

## 2011-02-09 DIAGNOSIS — K219 Gastro-esophageal reflux disease without esophagitis: Secondary | ICD-10-CM | POA: Insufficient documentation

## 2011-02-09 DIAGNOSIS — E669 Obesity, unspecified: Secondary | ICD-10-CM

## 2011-02-09 DIAGNOSIS — I10 Essential (primary) hypertension: Secondary | ICD-10-CM

## 2011-02-09 MED ORDER — ESOMEPRAZOLE MAGNESIUM 20 MG PO CPDR: 20.0000 mg | DELAYED_RELEASE_CAPSULE | Freq: Every day | ORAL | Status: DC

## 2011-02-09 MED ORDER — AMLODIPINE-OLMESARTAN 10-40 MG PO TABS: 1.0000 | ORAL_TABLET | Freq: Every day | ORAL | Status: DC

## 2011-02-09 MED ORDER — NEBIVOLOL HCL 10 MG PO TABS: 10.0000 mg | ORAL_TABLET | Freq: Every day | ORAL | Status: DC

## 2011-02-09 NOTE — Progress Notes (Signed)
Subjective:    Patient ID: Calvin Bailey, male    DOB: 01-05-1981, 30 y.o.   MRN: [id number]  HPI 1. Obesity Patient says he has always been overweight even when he was playing sports in college. He is overweight. His diet is mixed with mostly healthy components and some unhealthy foods. I believe he overeats when he does eat. I consoled him for 20 minutes on this issue. We discussed healthy food options and balancing meals and snacks. We also discussed using weight lifting as an effective way to increase metabolism and burn calories. We will check a TSH as well. -TSH -Lipid Panel  2. Tobacco abuse 12 years of smoking. Strongly desires to quit. Main trigger is stress from fiance. He smokes 3 cigarettes per day.  -Will see Nicholas Lose in the Smoking cessation clinic.  3. HTN Blood pressure slightly elevated. He has been off his BP meds as he needs refills. He is on Bystolic and Avol from a prior provider, says his pressure was well controlled. - weight loss -refilled home meds  4. Low testosterone Patient states he has a low testosterone per a test done at his prior PCP office. I will recheck this to possible replenish medically. - weight loss/exercise -testosterone level  5. Sleep apnea Had a sleep study at prior pcp's office. Has a CPAP machine. Is planning to use it. He does have low energy and wakes up in the middle of the night. - CPAP -weight loss  6. GERD Has attacks of GERD once a month, sometimes lasting a week.  - nexium 20 mg QD  7. Multiple sexual partners without protection No signs or symptoms of active STD. Is currently with one partner. I dont see the need to check GC/CL/Herpes without symptoms/signs, will check HIV however. -HIV    Review of Systems  All other systems reviewed and are negative.       Objective:   Physical Exam  Constitutional: He appears well-developed and well-nourished. No distress.       OBESE  HENT:  Head: Normocephalic and  atraumatic.  Mouth/Throat: Oropharynx is clear and moist. No oropharyngeal exudate.  Eyes: EOM are normal. Pupils are equal, round, and reactive to light. Right eye exhibits no discharge.  Neck: Normal range of motion. No JVD present. No tracheal deviation present. No thyromegaly present.  Cardiovascular: Normal rate, regular rhythm and normal heart sounds.  Exam reveals no gallop and no friction rub.   No murmur heard. Pulmonary/Chest: Effort normal and breath sounds normal. No respiratory distress. He has no wheezes. He has no rales. He exhibits no tenderness.  Abdominal: Soft. Bowel sounds are normal. He exhibits no distension and no mass. There is no tenderness.  Musculoskeletal: Normal range of motion. He exhibits no edema and no tenderness.  Neurological: He is alert. No cranial nerve deficit.  Skin: He is not diaphoretic.  Psychiatric: He has a normal mood and affect.        Assessment & Plan:  1. Obesity Patient says he has always been overweight even when he was playing sports in college. He is overweight. His diet is mixed with mostly healthy components and some unhealthy foods. I believe he overeats when he does eat. I consoled him for 20 minutes on this issue. We discussed healthy food options and balancing meals and snacks. We also discussed using weight lifting as an effective way to increase metabolism and burn calories. We will check a TSH as well. -TSH -Lipid Panel  2.  Tobacco abuse 12 years of smoking. Strongly desires to quit. Main trigger is stress from fiance. He smokes 3 cigarettes per day.  -Will see Nicholas Lose in the Smoking cessation clinic.  3. HTN Blood pressure slightly elevated. He has been off his BP meds as he needs refills. He is on Bystolic and Avol from a prior provider, says his pressure was well controlled. - weight loss -refilled home meds  4. Low testosterone Patient states he has a low testosterone per a test done at his prior PCP office. I will  recheck this to possible replenish medically. - weight loss/exercise -testosterone level  5. Sleep apnea Had a sleep study at prior pcp's office. Has a CPAP machine. Is planning to use it. He does have low energy and wakes up in the middle of the night. - CPAP -weight loss  6. GERD Has attacks of GERD once a month, sometimes lasting a week.  - nexium 20 mg QD  7. Multiple sexual partners without protection No signs or symptoms of active STD. Is currently with one partner. I dont see the need to check GC/CL/Herpes without symptoms/signs, will check HIV however. -HIV

## 2011-02-17 ENCOUNTER — Telehealth: Payer: Self-pay | Admitting: Family Medicine

## 2011-02-17 MED ORDER — AMLODIPINE BESYLATE 10 MG PO TABS: 10.0000 mg | ORAL_TABLET | Freq: Every day | ORAL | Status: DC

## 2011-02-17 MED ORDER — OLMESARTAN MEDOXOMIL 20 MG PO TABS: 20.0000 mg | ORAL_TABLET | Freq: Every day | ORAL | Status: DC

## 2011-02-17 NOTE — Telephone Encounter (Signed)
Mr. Ahlers was not able to fill his Azor rx for $4.00 at [facility].  Was told to call back if that was the case and provider will try to find another resource.

## 2011-02-18 ENCOUNTER — Telehealth: Payer: Self-pay | Admitting: Family Medicine

## 2011-02-18 NOTE — Telephone Encounter (Signed)
Calvin Bailey,  Did you find another medication for this patient??   ~t

## 2011-02-23 ENCOUNTER — Other Ambulatory Visit: Payer: Self-pay

## 2011-02-23 ENCOUNTER — Ambulatory Visit (INDEPENDENT_AMBULATORY_CARE_PROVIDER_SITE_OTHER): Payer: Self-pay | Admitting: Pharmacist

## 2011-02-23 ENCOUNTER — Encounter: Payer: Self-pay | Admitting: Pharmacist

## 2011-02-23 VITALS — BP 161/114 | HR 74 | Ht 73.5 in | Wt 398.2 lb

## 2011-02-23 DIAGNOSIS — F17200 Nicotine dependence, unspecified, uncomplicated: Secondary | ICD-10-CM

## 2011-02-23 DIAGNOSIS — F17210 Nicotine dependence, cigarettes, uncomplicated: Secondary | ICD-10-CM | POA: Insufficient documentation

## 2011-02-23 DIAGNOSIS — I10 Essential (primary) hypertension: Secondary | ICD-10-CM

## 2011-02-23 MED ORDER — LISINOPRIL 10 MG PO TABS: 10.0000 mg | ORAL_TABLET | Freq: Every day | ORAL | Status: DC

## 2011-02-23 MED ORDER — AMLODIPINE BESYLATE 10 MG PO TABS: 10.0000 mg | ORAL_TABLET | Freq: Every day | ORAL | Status: DC

## 2011-02-23 MED ORDER — CARVEDILOL 6.25 MG PO TABS: 6.2500 mg | ORAL_TABLET | Freq: Two times a day (BID) | ORAL | Status: DC

## 2011-02-23 NOTE — Assessment & Plan Note (Signed)
A: Uncontrolled due to the inability of the patient to afford his medications and not taking them for 1.5-2 weeks.  P: Discontinued Azor, Bystolic, and Benicar. Started amlodipine 10 mg daily, carvedilol 6.25 mg BID, and lisinopril 10 mg daily. Instructed patient to take his blood pressure whenever he can and record the numbers.

## 2011-02-23 NOTE — Progress Notes (Signed)
  Subjective:    Patient ID: Calvin Bailey, male    DOB: 02-15-1981, 30 y.o.   MRN: [id number]  HPI Patient arrives at the pharmacy clinic to discuss tobacco cessation. Currently smokes 5 cigarettes/day, was a previous 1 ppd smoker. He started smoking at 30 years old. Had a previous cold [place] quit attempt about 2 years ago that lasted for 3 months. States that if he was not smoking he would need something to keep his mouth busy, similar to a cigarette. Stated he used [employer] for this purpose before, but that he doesn't want to replace smoking with excessive eating. He states he feels the strongest urges to smoke in the morning and in the evening due to boredom. He also endorses smoking while driving, fishing, playing video games with his little brother, and when he gets upset. Says that he is unable to smoke for 3-4 hours after working out, but right now he only works out about once per month. States he would like to completely quit by September 26th because he has a little girl being born around that time.  Upon arrival, the patient stated he thought his blood pressure was going to be high. States that he checked it at his grandmother's house yesterday and it was 179/90. He says he recently lost his job and insurance coverage, so he has not been able to afford any of his medications. He has been 1.5-2 weeks without medication.   Review of Systems     Objective:   Physical Exam BP: Left forearm - 156/82; right forearm - 161/114       Assessment & Plan:  Tobacco Cessation - A: Currently smoking 5 cigarettes/day and contemplative about quitting. When asked about his perceived ability to quit and the importance of quitting, he rated these a 10/10. But when asked about his readiness to quit, he rated this a 7/10.   P: Give up smoking while driving and try to identify ways to cut out smoking in the evening. Encouraged patient to work out more often, as this could help curb his tobacco  cravings. Suggested he chew on cut up straws or find some other method to keep his mouth busy. Return to pharmacy clinic in 1 month for follow-up.  Hypertension - A: Uncontrolled due to the inability of the patient to afford his medications and not taking them for 1.5-2 weeks.  P: Discontinued Azor, Bystolic, and Benicar. Started amlodipine 10 mg daily, carvedilol 6.25 mg BID, and lisinopril 10 mg daily. Instructed patient to take his blood pressure whenever he can and record the numbers.   TFTCT: 35 minutes Seen with Kennon Portela, PharmD Candidate

## 2011-02-23 NOTE — Assessment & Plan Note (Signed)
A: Currently smoking 5 cigarettes/day and contemplative about quitting. When asked about his perceived ability to quit and the importance of quitting, he rated these a 10/10. But when asked about his readiness to quit, he rated this a 7/10.   P: Give up smoking while driving and try to identify ways to cut out smoking in the evening. Encouraged patient to work out more often, as this could help curb his tobacco cravings. Suggested he chew on cut up straws or find some other method to keep his mouth busy. Return to pharmacy clinic in 1 month for follow-up.

## 2011-02-23 NOTE — Patient Instructions (Signed)
Thank you for coming to see [place] today! Today we discussed making progress on quitting smoking. Try to cut out smoking while driving and think of things you could do in the evening that could replace smoking. We are starting you on three blood pressure medications today; lisinopril, carvedilol, and amlodipine. Check your blood pressure whenever you can and write the numbers down for [place]. Make an appointment with the pharmacy clinic for 1 month from now.

## 2011-02-23 NOTE — Progress Notes (Signed)
  Subjective:    Patient ID: Calvin Bailey, male    DOB: [birth year], 30 y.o.   MRN: [id number]  HPI Reviewed and agree with Dr. Graylin Shiver management.    Review of Systems     Objective:   Physical Exam        Assessment & Plan:

## 2011-02-23 NOTE — Progress Notes (Signed)
Labs done today Calvin Bailey 

## 2011-02-24 ENCOUNTER — Encounter: Payer: Self-pay | Admitting: Family Medicine

## 2011-02-24 LAB — LIPID PANEL
HDL: 30 mg/dL — ABNORMAL LOW (ref >39)
Triglycerides: 172 mg/dL — ABNORMAL HIGH (ref <150)

## 2011-02-24 LAB — CBC
MCH: 29.2 pg (ref 26.0–34.0)
MCHC: 33.6 g/dL (ref 30.0–36.0)
MCV: 86.9 fL (ref 78.0–100.0)
Platelets: 277 10*3/uL (ref 150–400)
RBC: 5.1 MIL/uL (ref 4.22–5.81)

## 2011-02-24 LAB — TSH: TSH: 0.818 u[IU]/mL (ref 0.350–4.500)

## 2011-02-24 LAB — BASIC METABOLIC PANEL
BUN: 13 mg/dL (ref 6–23)
CO2: 25 mEq/L (ref 19–32)
Chloride: 101 mEq/L (ref 96–112)
Potassium: 4.2 mEq/L (ref 3.5–5.3)

## 2011-02-24 LAB — SEX HORMONE BINDING GLOBULIN: Sex Hormone Binding: 7 nmol/L — ABNORMAL LOW (ref 13–71)

## 2011-02-24 LAB — HIV ANTIBODY (ROUTINE TESTING W REFLEX): HIV: NONREACTIVE

## 2011-03-25 ENCOUNTER — Inpatient Hospital Stay (INDEPENDENT_AMBULATORY_CARE_PROVIDER_SITE_OTHER)
Admission: RE | Admit: 2011-03-25 | Discharge: 2011-03-25 | Disposition: A | Discharge status: Home / Self Care | Payer: Self-pay | Source: Ambulatory Visit | Attending: Family Medicine | Admitting: Family Medicine

## 2011-03-25 ENCOUNTER — Ambulatory Visit (INDEPENDENT_AMBULATORY_CARE_PROVIDER_SITE_OTHER): Payer: Self-pay

## 2011-03-25 DIAGNOSIS — J069 Acute upper respiratory infection, unspecified: Secondary | ICD-10-CM

## 2011-03-25 DIAGNOSIS — R05 Cough: Secondary | ICD-10-CM

## 2011-03-25 DIAGNOSIS — R059 Cough, unspecified: Secondary | ICD-10-CM

## 2011-03-25 IMAGING — CR DG CHEST 2V
2 series · 2 of 2 positions shown · non-contrast
Comparison: Chest radiograph [DATE]

CLINICAL DATA: Cough and fever

CHEST - 2 VIEW

[view not recorded (1 of 2)]
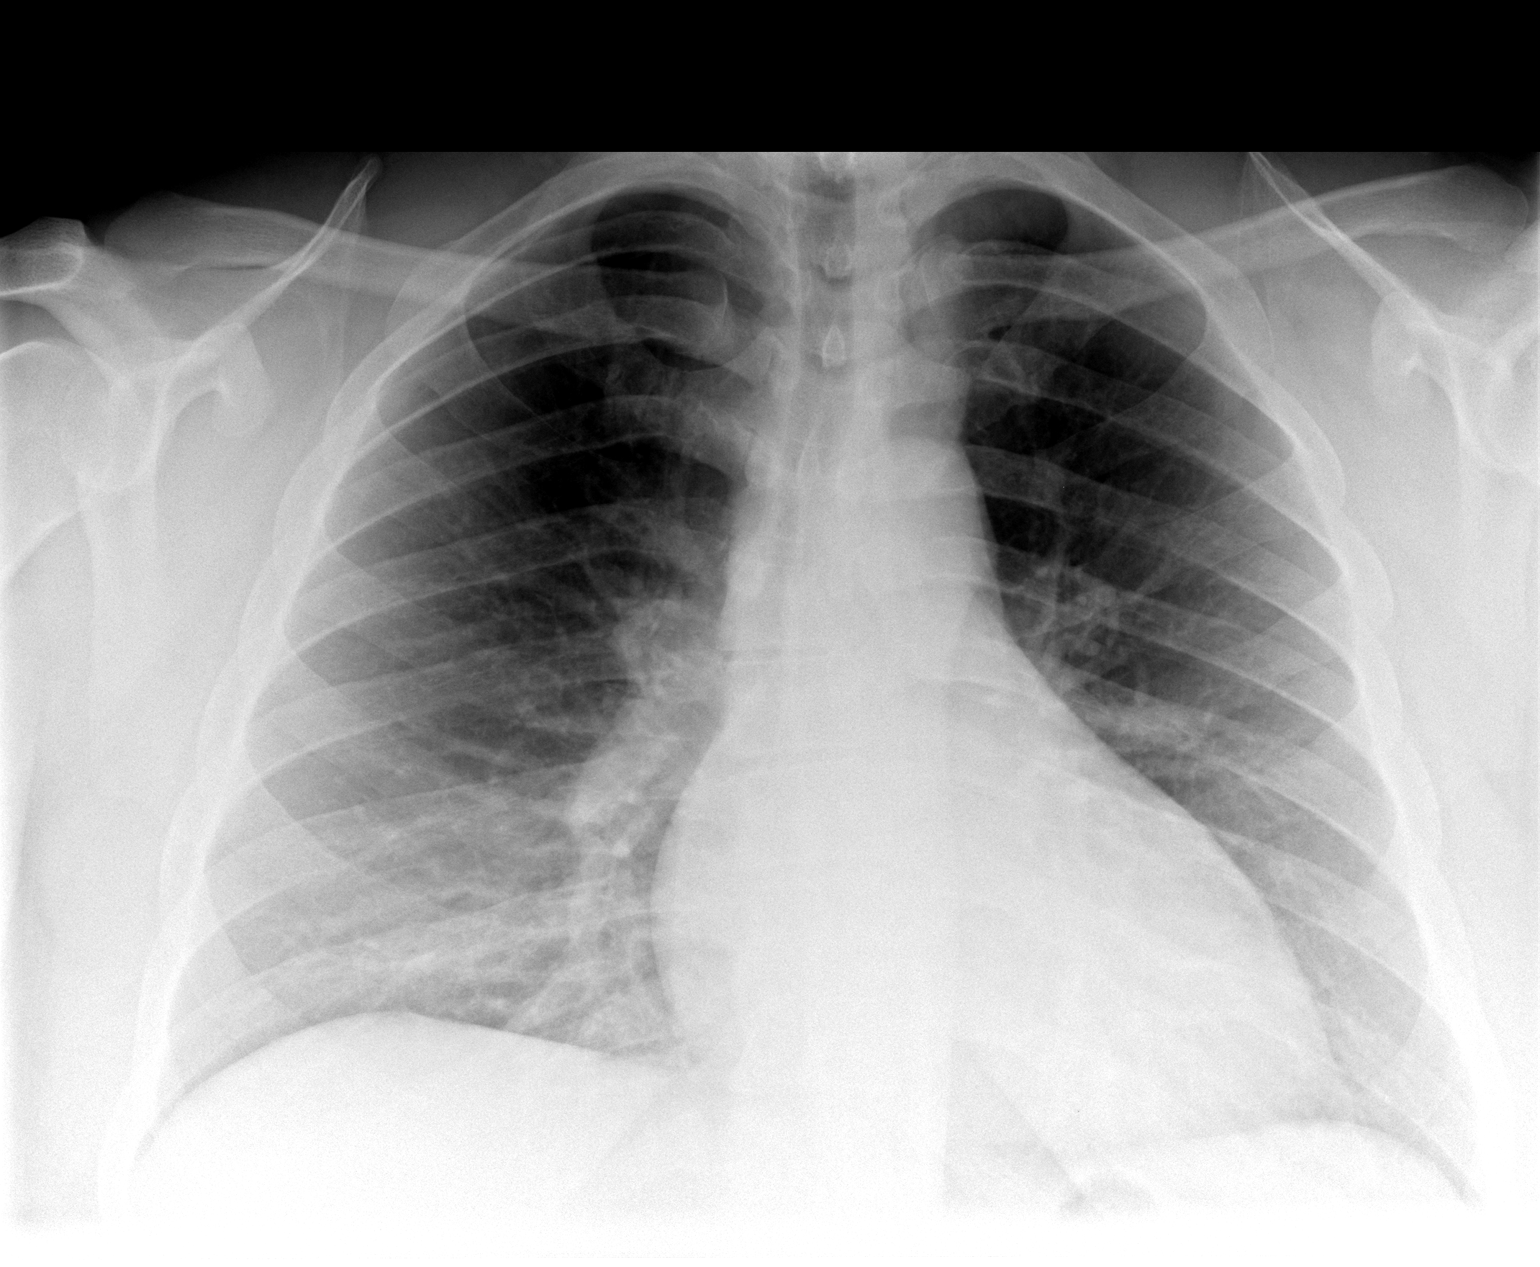

[view not recorded (2 of 2)]
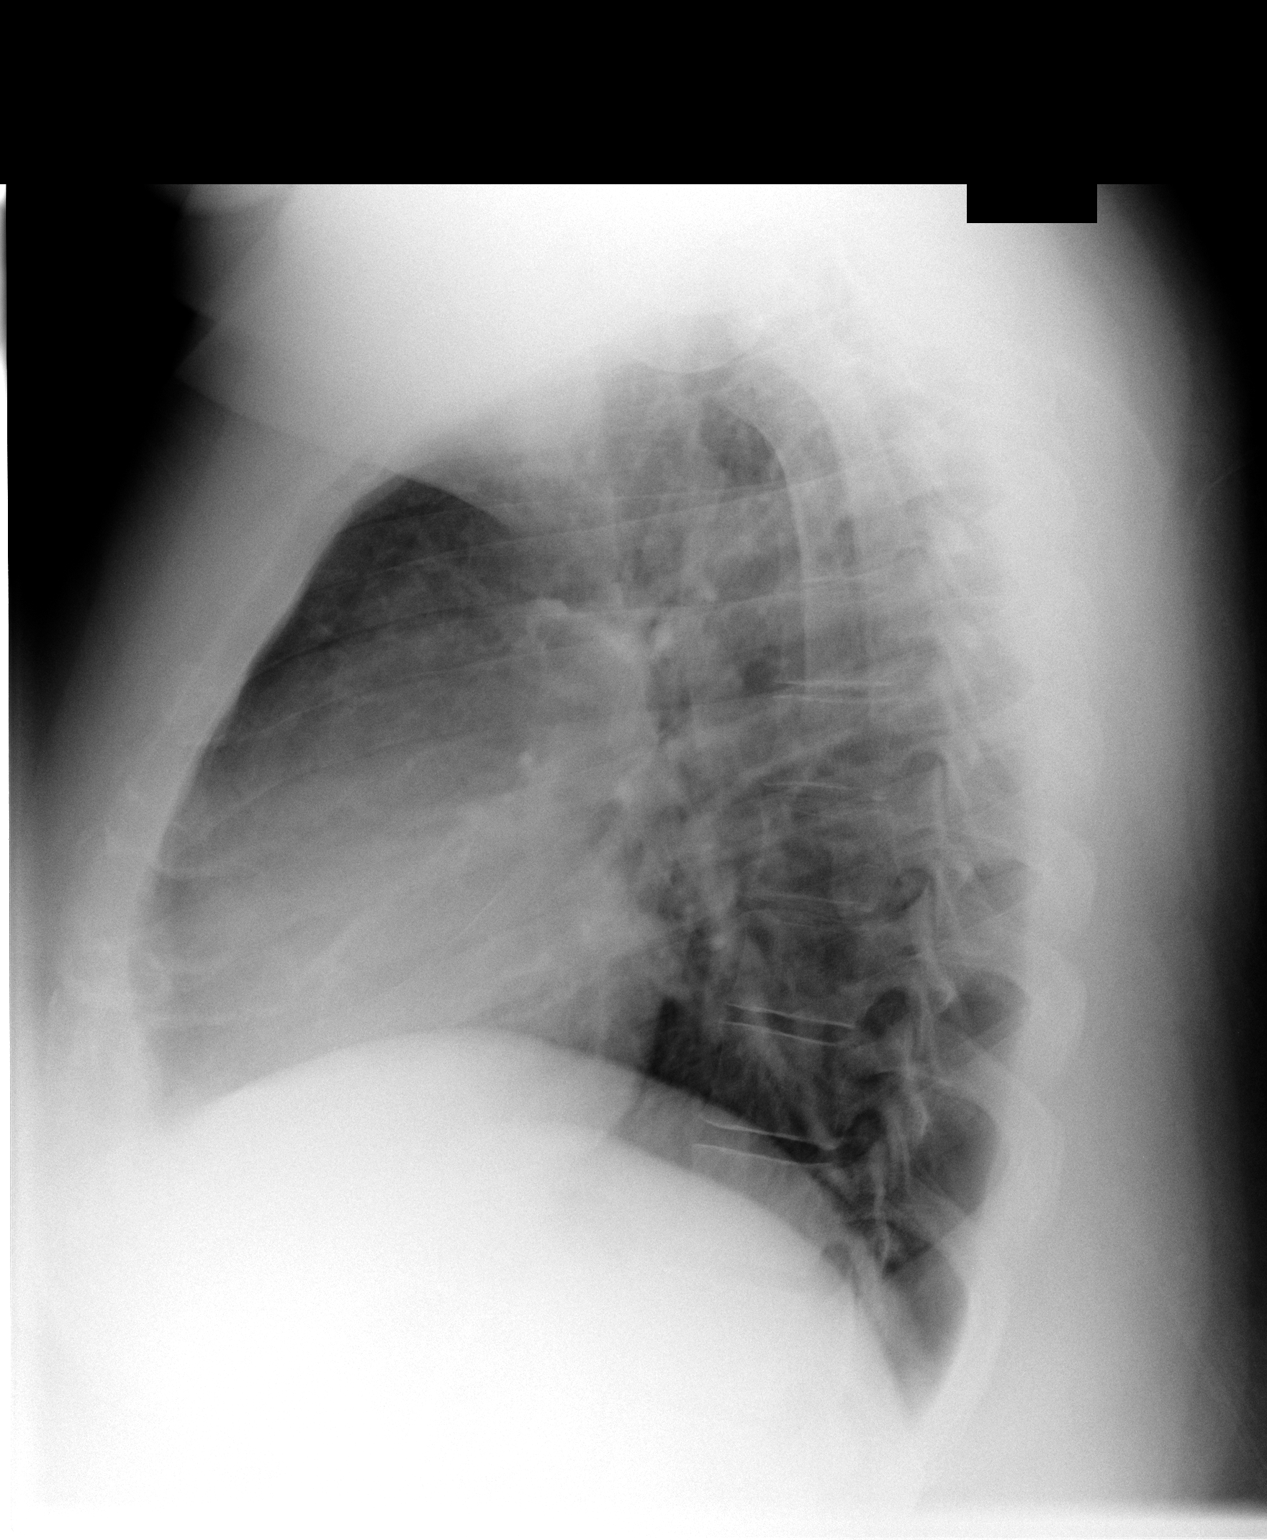

[2 of 2 positions shown; findings below may reference images not displayed]

FINDINGS: Normal mediastinum and heart silhouette.  Costophrenic
angles are clear.  No evidence effusion, infiltrate, or
pneumothorax.
IMPRESSION: Normal chest radiograph.

## 2011-05-08 ENCOUNTER — Inpatient Hospital Stay (INDEPENDENT_AMBULATORY_CARE_PROVIDER_SITE_OTHER)
Admission: RE | Admit: 2011-05-08 | Discharge: 2011-05-08 | Disposition: A | Discharge status: Home / Self Care | Payer: Self-pay | Source: Ambulatory Visit | Attending: Family Medicine | Admitting: Family Medicine

## 2011-05-08 DIAGNOSIS — K3184 Gastroparesis: Secondary | ICD-10-CM

## 2011-05-08 DIAGNOSIS — A059 Bacterial foodborne intoxication, unspecified: Secondary | ICD-10-CM

## 2011-05-08 LAB — DIFFERENTIAL
Basophils Absolute: 0 10*3/uL (ref 0.0–0.1)
Basophils Relative: 0 % (ref 0–1)
Monocytes Absolute: 0.7 10*3/uL (ref 0.1–1.0)
Neutro Abs: 9.1 10*3/uL — ABNORMAL HIGH (ref 1.7–7.7)
Neutrophils Relative %: 86 % — ABNORMAL HIGH (ref 43–77)

## 2011-05-08 LAB — CBC
Hemoglobin: 14.8 g/dL (ref 13.0–17.0)
MCHC: 35.4 g/dL (ref 30.0–36.0)
RBC: 4.93 MIL/uL (ref 4.22–5.81)

## 2011-05-08 LAB — BASIC METABOLIC PANEL
GFR calc Af Amer: >60 mL/min (ref >60)
GFR calc non Af Amer: >60 mL/min (ref >60)
Potassium: 4 mEq/L (ref 3.5–5.1)
Sodium: 136 mEq/L (ref 135–145)

## 2011-05-11 LAB — STOOL CULTURE

## 2011-06-12 ENCOUNTER — Emergency Department (HOSPITAL_COMMUNITY)
Admission: EM | Admit: 2011-06-12 | Discharge: 2011-06-12 | Discharge status: Against Medical Advice | Payer: Self-pay | Attending: Emergency Medicine | Admitting: Emergency Medicine

## 2011-06-13 ENCOUNTER — Emergency Department (HOSPITAL_COMMUNITY)
Admission: EM | Admit: 2011-06-13 | Discharge: 2011-06-13 | Disposition: A | Discharge status: Home / Self Care | Payer: Self-pay | Attending: Emergency Medicine | Admitting: Emergency Medicine

## 2011-06-13 ENCOUNTER — Emergency Department (HOSPITAL_COMMUNITY): Payer: Self-pay

## 2011-06-13 DIAGNOSIS — R0609 Other forms of dyspnea: Secondary | ICD-10-CM | POA: Insufficient documentation

## 2011-06-13 DIAGNOSIS — I10 Essential (primary) hypertension: Secondary | ICD-10-CM | POA: Insufficient documentation

## 2011-06-13 DIAGNOSIS — R109 Unspecified abdominal pain: Secondary | ICD-10-CM | POA: Insufficient documentation

## 2011-06-13 DIAGNOSIS — R079 Chest pain, unspecified: Secondary | ICD-10-CM | POA: Insufficient documentation

## 2011-06-13 DIAGNOSIS — R0989 Other specified symptoms and signs involving the circulatory and respiratory systems: Secondary | ICD-10-CM | POA: Insufficient documentation

## 2011-06-13 LAB — DIFFERENTIAL
Basophils Relative: 0 % (ref 0–1)
Eosinophils Absolute: 0.3 10*3/uL (ref 0.0–0.7)
Lymphs Abs: 3.2 10*3/uL (ref 0.7–4.0)
Monocytes Absolute: 0.9 10*3/uL (ref 0.1–1.0)
Monocytes Relative: 7 % (ref 3–12)
Neutro Abs: 7.1 10*3/uL (ref 1.7–7.7)

## 2011-06-13 LAB — URINALYSIS, ROUTINE W REFLEX MICROSCOPIC
Bilirubin Urine: NEGATIVE
Glucose, UA: NEGATIVE mg/dL
Hgb urine dipstick: NEGATIVE
Protein, ur: NEGATIVE mg/dL
Urobilinogen, UA: 0.2 mg/dL (ref 0.0–1.0)

## 2011-06-13 LAB — COMPREHENSIVE METABOLIC PANEL
ALT: 35 U/L (ref 0–53)
Calcium: 9.5 mg/dL (ref 8.4–10.5)
Creatinine, Ser: 1.02 mg/dL (ref 0.50–1.35)
GFR calc Af Amer: >60 mL/min (ref >60)
GFR calc non Af Amer: >60 mL/min (ref >60)
Glucose, Bld: 109 mg/dL — ABNORMAL HIGH (ref 70–99)
Sodium: 137 mEq/L (ref 135–145)
Total Protein: 7.6 g/dL (ref 6.0–8.3)

## 2011-06-13 LAB — LIPASE, BLOOD: Lipase: 26 U/L (ref 11–59)

## 2011-06-13 LAB — CBC
Hemoglobin: 13.8 g/dL (ref 13.0–17.0)
MCH: 28.5 pg (ref 26.0–34.0)
MCHC: 33.2 g/dL (ref 30.0–36.0)
MCV: 85.8 fL (ref 78.0–100.0)

## 2011-06-13 IMAGING — CR DG CHEST 2V
2 series · 2 of 2 positions shown · non-contrast
Comparison: Chest radiograph performed [DATE]

CLINICAL DATA: Right lower chest pain and right upper quadrant
abdominal pain for several days.

CHEST - 2 VIEW

[w chest pa]
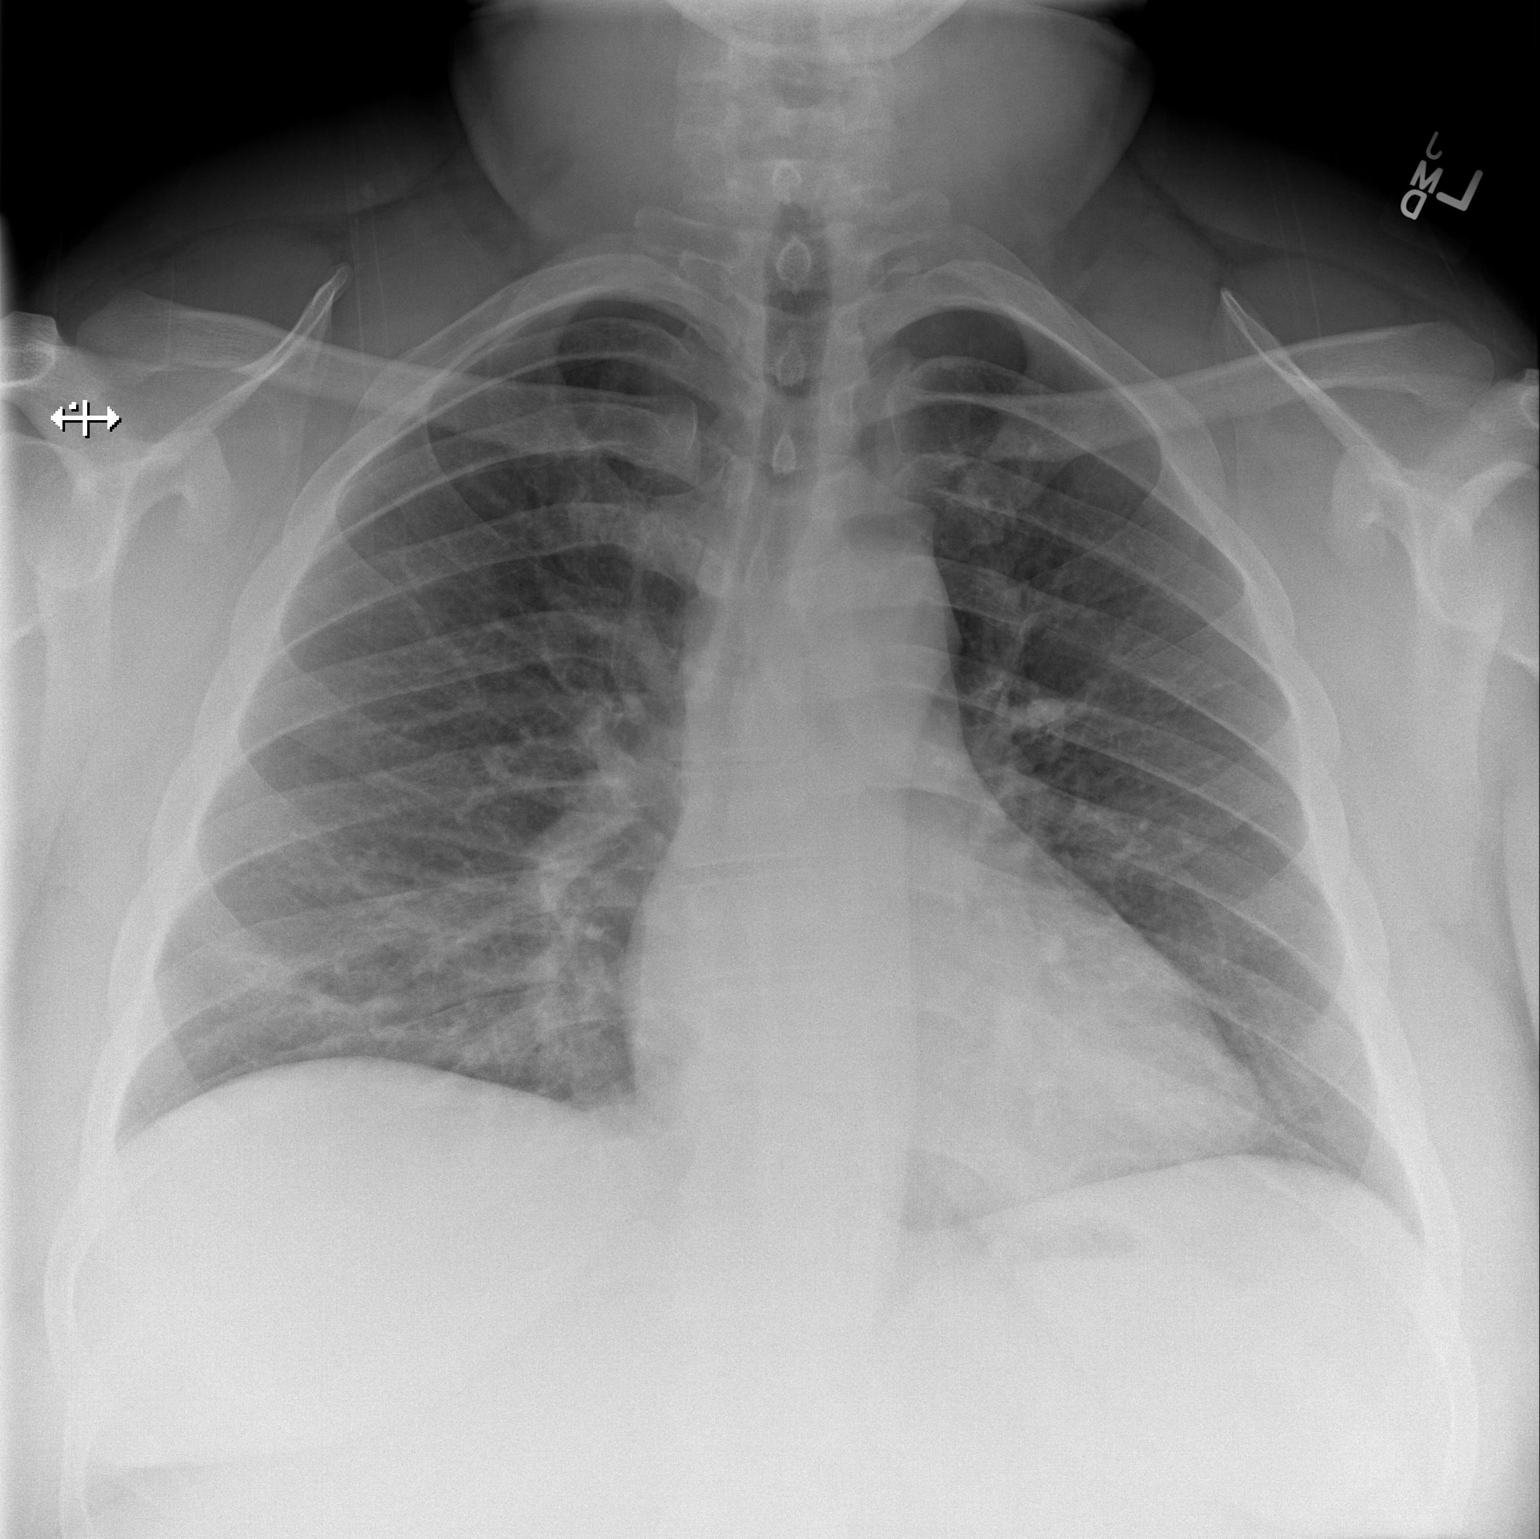

[w chest lat]
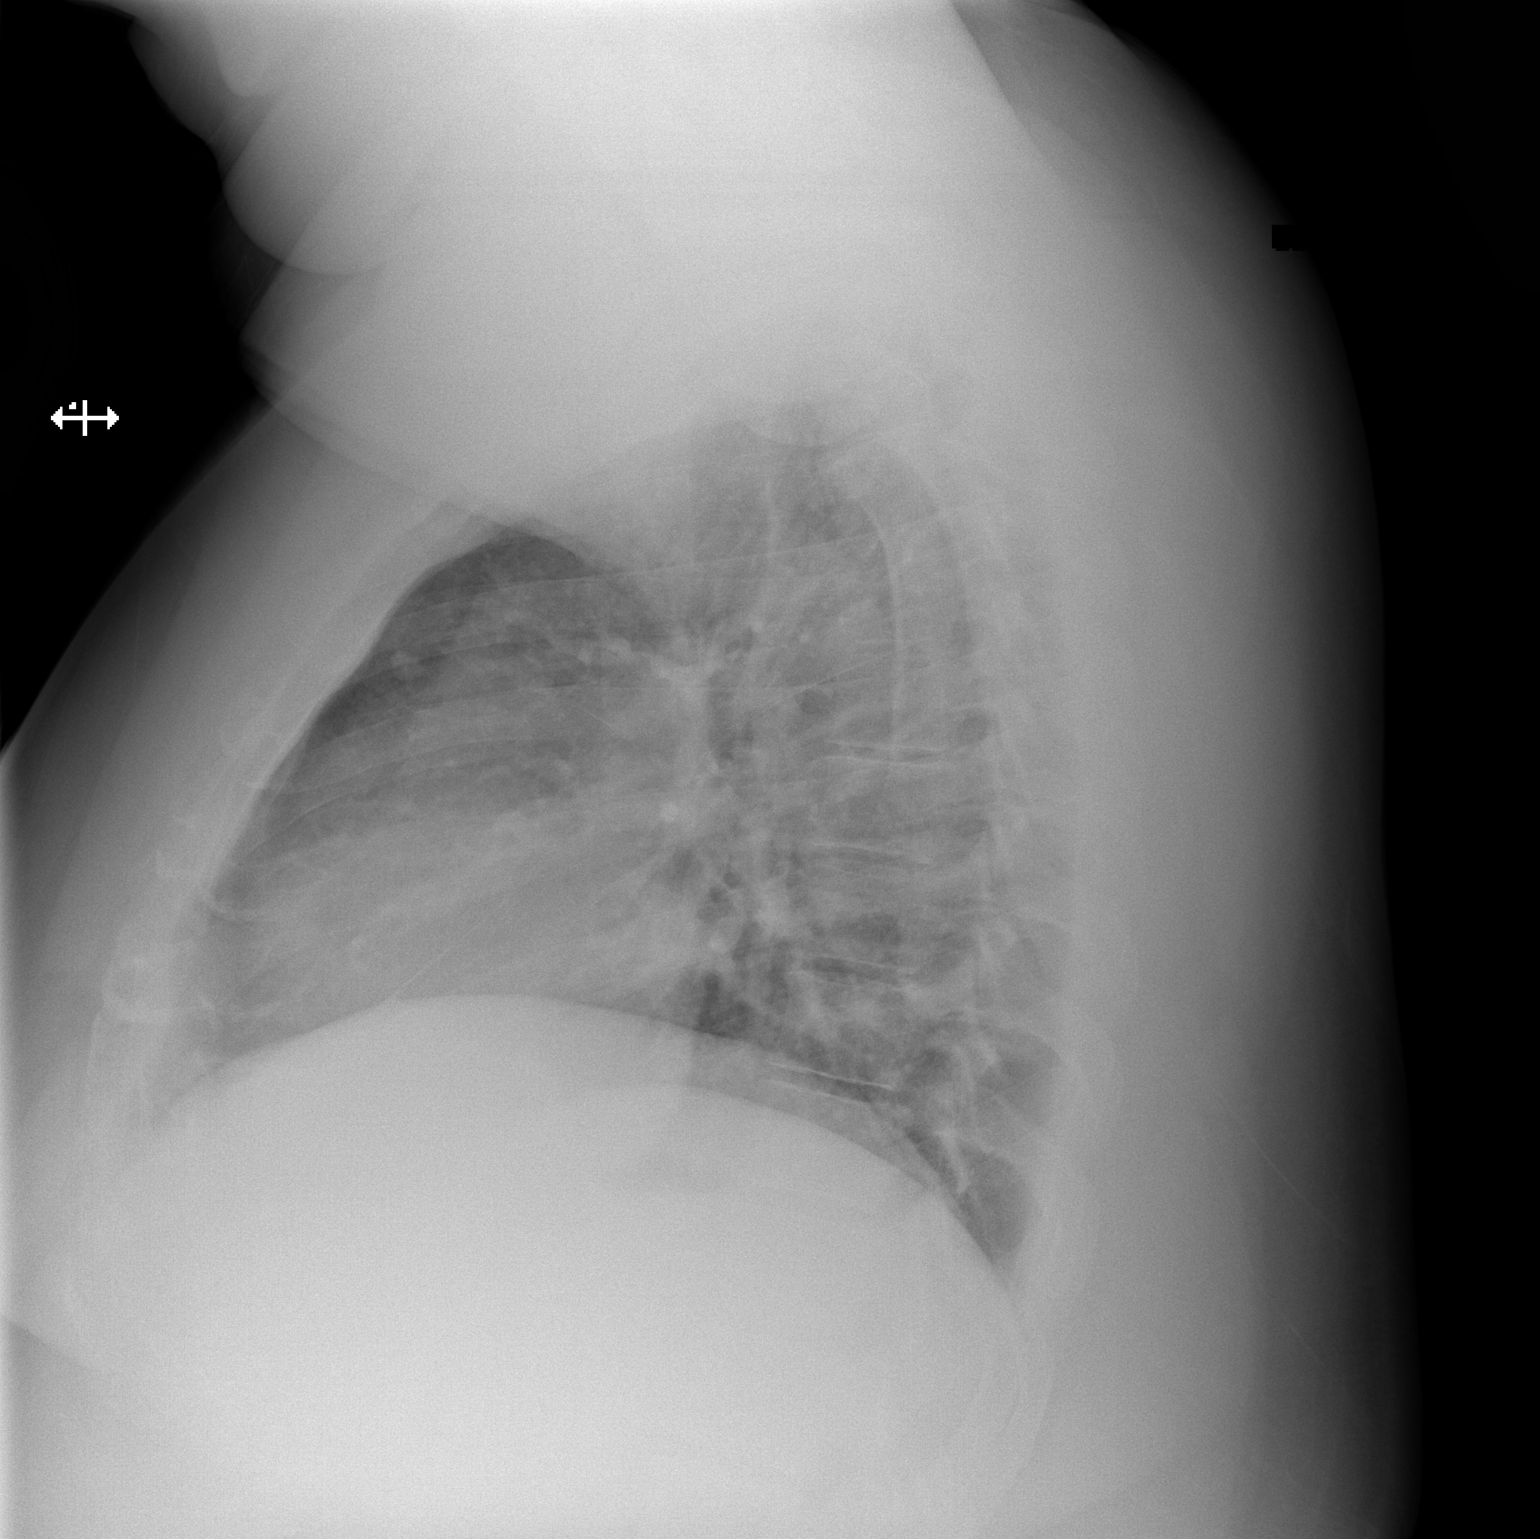

[2 of 2 positions shown; findings below may reference images not displayed]

FINDINGS: The lungs are well-aerated.  Minimal bibasilar opacities
likely reflect atelectasis.  There is no evidence of focal
opacification, pleural effusion or pneumothorax.  Pulmonary
vascularity is at the upper limits of normal.

The heart is normal in size; the mediastinal contour is within
normal limits.  No acute osseous abnormalities are seen.
IMPRESSION: Minimal bibasilar opacities likely reflect atelectasis.  No
displaced rib fractures seen.

## 2011-07-02 LAB — BASIC METABOLIC PANEL
Calcium: 8.8
GFR calc Af Amer: >60
GFR calc non Af Amer: >60
Glucose, Bld: 110 — ABNORMAL HIGH
Potassium: 4.3
Sodium: 137

## 2011-11-10 ENCOUNTER — Encounter (HOSPITAL_COMMUNITY): Payer: Self-pay | Admitting: *Deleted

## 2011-11-10 ENCOUNTER — Emergency Department (INDEPENDENT_AMBULATORY_CARE_PROVIDER_SITE_OTHER)
Admission: EM | Admit: 2011-11-10 | Discharge: 2011-11-10 | Disposition: A | Discharge status: Home / Self Care | Payer: Self-pay | Source: Home / Self Care | Attending: Family Medicine | Admitting: Family Medicine

## 2011-11-10 DIAGNOSIS — L02211 Cutaneous abscess of abdominal wall: Secondary | ICD-10-CM

## 2011-11-10 DIAGNOSIS — L02219 Cutaneous abscess of trunk, unspecified: Secondary | ICD-10-CM

## 2011-11-10 MED ORDER — DOXYCYCLINE HYCLATE 100 MG PO CAPS: 100.0000 mg | ORAL_CAPSULE | Freq: Two times a day (BID) | ORAL | Status: AC

## 2011-11-10 NOTE — ED Provider Notes (Signed)
History     CSN: [id number]  Arrival date & time 11/10/11  65   First MD Initiated Contact with Patient 11/10/11 1721      No chief complaint on file.   (Consider location/radiation/quality/duration/timing/severity/associated sxs/prior treatment) Patient is a 31 y.o. male presenting with rash. The history is provided by the patient.  Rash  This is a new problem. The current episode started more than 1 week ago (present and enlarging on abd wall for 1 week , then began draining for 1 week. wants to know if it is mrsa.Calvin Bailey). The problem has been gradually improving. The problem is associated with an unknown factor. There has been no fever.    Past Medical History  Diagnosis Date  . HTN (hypertension)   . Sleep apnea   . GERD (gastroesophageal reflux disease)     Past Surgical History  Procedure Date  . Two knee surgeries 1995    Family History  Problem Relation Age of Onset  . Hypertension Mother   . Hypertension Father   . Hypertension Brother   . Hyperlipidemia Paternal Grandfather   . Diabetes Maternal Grandmother   . Heart failure Maternal Grandfather   . Heart failure Paternal Grandfather     History  Substance Use Topics  . Smoking status: Current Everyday Smoker -- 0.3 packs/day for 13 years    Types: Cigarettes  . Smokeless tobacco: Not on file  . Alcohol Use: Yes      Review of Systems  Constitutional: Negative.   Skin: Positive for rash and wound.    Allergies  Review of patient's allergies indicates no known allergies.  Home Medications   Current Outpatient Rx  Name Route Sig Dispense Refill  . AMLODIPINE BESYLATE 10 MG PO TABS Oral Take 1 tablet (10 mg total) by mouth daily. 90 tablet 3  . CARVEDILOL 6.25 MG PO TABS Oral Take 1 tablet (6.25 mg total) by mouth 2 (two) times daily. 180 tablet 3  . DOXYCYCLINE HYCLATE 100 MG PO CAPS Oral Take 1 capsule (100 mg total) by mouth 2 (two) times daily. 20 capsule 0  . ESOMEPRAZOLE MAGNESIUM 20 MG PO  CPDR Oral Take 1 capsule (20 mg total) by mouth daily. 30 capsule 3  . LISINOPRIL 10 MG PO TABS Oral Take 1 tablet (10 mg total) by mouth daily. 90 tablet 3    BP 185/80  Pulse 78  Temp(Src) 98.1 F (36.7 C) (Oral)  Resp 22  SpO2 98%  Physical Exam  Nursing note and vitals reviewed. Constitutional: He appears well-developed and well-nourished.  Skin: Skin is warm and dry.       Fluctuant abscess site to left abd, draining from mult sites, purulent fluid.cultured.    ED Course  INCISION AND DRAINAGE Date/Time: 11/10/2011 6:44 PM Performed by: Pauline Good Authorized by: Pauline Good Consent: Verbal consent obtained. Consent given by: patient Patient understanding: patient states understanding of the procedure being performed Type: abscess Body area: trunk Location details: abdomen Local anesthetic: topical anesthetic Patient sedated: no Scalpel size: 15 Incision type: single straight Complexity: simple Drainage: purulent Drainage amount: scant Packing material: none Patient tolerance: Patient tolerated the procedure well with no immediate complications.   (including critical care time)   Labs Reviewed  WOUND CULTURE   No results found.   1. Abscess of abdominal wall       MDM  I+d performed.        Pauline Good, MD 11/10/11 [phone]

## 2011-11-10 NOTE — Discharge Instructions (Signed)
Warm compress twice a day when you take the antibiotic, take all of medicine, return as needed. °

## 2011-11-10 NOTE — ED Notes (Signed)
Abscess abdomen left upper onset x 2 weeks

## 2011-11-13 LAB — WOUND CULTURE: Culture: NO GROWTH

## 2011-12-16 ENCOUNTER — Telehealth (HOSPITAL_COMMUNITY): Payer: Self-pay | Admitting: *Deleted

## 2011-12-16 NOTE — ED Notes (Signed)
1356  Pt. called for lab result. 1558 I called and left message to call back. 1625 Pt. called back and verified. Pt. told culture said no growth and that is why we did not call his results. We only call if it is positive for MRSA or needs different treatment.  I asked pt. how it was doing and he said it is almost healed. He still has a small bump there. I told him don't squeeze it. It may be a little scar tissue under the skin. Roselyn Meier 12/16/2011

## 2012-02-11 ENCOUNTER — Emergency Department (HOSPITAL_COMMUNITY)
Admission: EM | Admit: 2012-02-11 | Discharge: 2012-02-12 | Disposition: A | Discharge status: Home / Self Care | Payer: Self-pay | Attending: Emergency Medicine | Admitting: Emergency Medicine

## 2012-02-11 DIAGNOSIS — I10 Essential (primary) hypertension: Secondary | ICD-10-CM | POA: Insufficient documentation

## 2012-02-11 DIAGNOSIS — K219 Gastro-esophageal reflux disease without esophagitis: Secondary | ICD-10-CM | POA: Insufficient documentation

## 2012-02-11 DIAGNOSIS — G4730 Sleep apnea, unspecified: Secondary | ICD-10-CM | POA: Insufficient documentation

## 2012-02-11 DIAGNOSIS — M79609 Pain in unspecified limb: Secondary | ICD-10-CM | POA: Insufficient documentation

## 2012-02-12 ENCOUNTER — Encounter (HOSPITAL_COMMUNITY): Payer: Self-pay | Admitting: Emergency Medicine

## 2012-02-12 LAB — CBC
MCH: 29.2 pg (ref 26.0–34.0)
MCHC: 34.1 g/dL (ref 30.0–36.0)
MCV: 85.6 fL (ref 78.0–100.0)
Platelets: 276 10*3/uL (ref 150–400)

## 2012-02-12 LAB — POCT I-STAT, CHEM 8
BUN: 13 mg/dL (ref 6–23)
Calcium, Ion: 1.2 mmol/L (ref 1.12–1.32)
Creatinine, Ser: 1.1 mg/dL (ref 0.50–1.35)
Hemoglobin: 15.3 g/dL (ref 13.0–17.0)
Sodium: 141 mEq/L (ref 135–145)
TCO2: 27 mmol/L (ref 0–100)

## 2012-02-12 LAB — DIFFERENTIAL
Basophils Relative: 0 % (ref 0–1)
Eosinophils Absolute: 0.2 10*3/uL (ref 0.0–0.7)
Eosinophils Relative: 2 % (ref 0–5)
Lymphs Abs: 3.1 10*3/uL (ref 0.7–4.0)
Neutrophils Relative %: 61 % (ref 43–77)

## 2012-02-12 LAB — D-DIMER, QUANTITATIVE: D-Dimer, Quant: 0.4 ug/mL-FEU (ref 0.00–0.48)

## 2012-02-12 NOTE — ED Notes (Signed)
Pt states he has been having pain in his left leg for the past couple of weeks  Pt states the pain comes and goes  Pt states the pain is in his lower leg in the calf area  Pt states it is tender to the touch and feels kind of hard  Negative homans sign

## 2012-02-12 NOTE — ED Provider Notes (Signed)
History     CSN: [id number]  Arrival date & time 02/11/12  2318   First MD Initiated Contact with Patient 02/12/12 0105      Chief Complaint  Patient presents with  . Leg Pain    (Consider location/radiation/quality/duration/timing/severity/associated sxs/prior treatment) HPI Comments: Patient states that for months.  His left lower medial leg has been painful intermittently but for the past 3 weeks.  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  Denies any recent travel, shortness of breath, swelling, or trauma to the leg  Patient is a 31 y.o. male presenting with leg pain. The history is provided by the patient.  Leg Pain  The incident occurred more than 1 week ago. The incident occurred at home. There was no injury mechanism. The pain is present in the left leg. Pertinent negatives include no numbness.    Past Medical History  Diagnosis Date  . HTN (hypertension)   . Sleep apnea   . GERD (gastroesophageal reflux disease)     Past Surgical History  Procedure Date  . Two knee surgeries 1995    Family History  Problem Relation Age of Onset  . Hypertension Mother   . Hypertension Father   . Hypertension Brother   . Hyperlipidemia Paternal Grandfather   . Diabetes Maternal Grandmother   . Heart failure Maternal Grandfather   . Heart failure Paternal Grandfather     History  Substance Use Topics  . Smoking status: Current Everyday Smoker -- 0.3 packs/day for 13 years    Types: Cigarettes  . Smokeless tobacco: Not on file  . Alcohol Use: Yes     occassional      Review of Systems  Constitutional: Negative for fever and chills.  Respiratory: Negative for shortness of breath.   Cardiovascular: Negative for palpitations and leg swelling.  Neurological: Negative for weakness and numbness.    Allergies  Review of patient's allergies indicates no known allergies.  Home  Medications   Current Outpatient Rx  Name Route Sig Dispense Refill  . AMLODIPINE BESYLATE 10 MG PO TABS Oral Take 1 tablet (10 mg total) by mouth daily. 90 tablet 3  . LISINOPRIL 10 MG PO TABS Oral Take 1 tablet (10 mg total) by mouth daily. 90 tablet 3    BP 143/59  Pulse 93  Temp(Src) 99.2 F (37.3 C) (Oral)  Resp 18  SpO2 94%  Physical Exam  Constitutional: He is oriented to person, place, and time. He appears well-developed and well-nourished.  HENT:  Head: Normocephalic.  Eyes: Pupils are equal, round, and reactive to light.  Neck: Normal range of motion.  Cardiovascular: Normal rate.   Pulmonary/Chest: Effort normal.  Musculoskeletal: Normal range of motion. He exhibits no edema and no tenderness.  Neurological: He is alert and oriented to person, place, and time.  Skin: Skin is warm. No rash noted. No erythema.    ED Course  Procedures (including critical care time)  Labs Reviewed  POCT I-STAT, CHEM 8 - Abnormal; Notable for the following:    Glucose, Bld 116 (*)    All other components within normal limits  CBC  DIFFERENTIAL  D-DIMER, QUANTITATIVE   No results found.   1. Extremity pain       MDM  Will obtain D Dimer to evaluate for potential DVT        Garald Balding, NP 02/12/12 1954

## 2012-02-12 NOTE — Discharge Instructions (Signed)
No cause for your discomfort was found tonight there is no evidence of a blood clot  You should try wearing support socks while standing during school this will help overall circulation

## 2012-02-14 NOTE — ED Provider Notes (Signed)
Medical screening examination/treatment/procedure(s) were performed by non-physician practitioner and as supervising physician I was immediately available for consultation/collaboration.  Chauncy Passy, MD 02/14/12 [phone]

## 2012-02-17 DIAGNOSIS — M79609 Pain in unspecified limb: Secondary | ICD-10-CM

## 2012-02-19 ENCOUNTER — Encounter (HOSPITAL_COMMUNITY): Payer: Self-pay

## 2012-02-19 ENCOUNTER — Emergency Department (HOSPITAL_COMMUNITY): Payer: Self-pay

## 2012-02-19 ENCOUNTER — Emergency Department (INDEPENDENT_AMBULATORY_CARE_PROVIDER_SITE_OTHER)
Admission: EM | Admit: 2012-02-19 | Discharge: 2012-02-19 | Disposition: A | Discharge status: Home / Self Care | Payer: Self-pay | Source: Home / Self Care | Attending: Emergency Medicine | Admitting: Emergency Medicine

## 2012-02-19 ENCOUNTER — Encounter (HOSPITAL_COMMUNITY): Payer: Self-pay | Admitting: *Deleted

## 2012-02-19 ENCOUNTER — Emergency Department (HOSPITAL_COMMUNITY)
Admission: EM | Admit: 2012-02-19 | Discharge: 2012-02-19 | Disposition: A | Discharge status: Home / Self Care | Payer: Self-pay | Attending: Emergency Medicine | Admitting: Emergency Medicine

## 2012-02-19 DIAGNOSIS — F17200 Nicotine dependence, unspecified, uncomplicated: Secondary | ICD-10-CM | POA: Insufficient documentation

## 2012-02-19 DIAGNOSIS — I10 Essential (primary) hypertension: Secondary | ICD-10-CM | POA: Insufficient documentation

## 2012-02-19 DIAGNOSIS — X58XXXA Exposure to other specified factors, initial encounter: Secondary | ICD-10-CM | POA: Insufficient documentation

## 2012-02-19 DIAGNOSIS — K219 Gastro-esophageal reflux disease without esophagitis: Secondary | ICD-10-CM | POA: Insufficient documentation

## 2012-02-19 DIAGNOSIS — M79609 Pain in unspecified limb: Secondary | ICD-10-CM | POA: Insufficient documentation

## 2012-02-19 DIAGNOSIS — T148XXA Other injury of unspecified body region, initial encounter: Secondary | ICD-10-CM

## 2012-02-19 DIAGNOSIS — G4730 Sleep apnea, unspecified: Secondary | ICD-10-CM | POA: Insufficient documentation

## 2012-02-19 DIAGNOSIS — IMO0002 Reserved for concepts with insufficient information to code with codable children: Secondary | ICD-10-CM | POA: Insufficient documentation

## 2012-02-19 DIAGNOSIS — M7989 Other specified soft tissue disorders: Secondary | ICD-10-CM | POA: Insufficient documentation

## 2012-02-19 IMAGING — CR DG TIBIA/FIBULA 2V*L*
4 series · 4 of 4 positions shown · non-contrast
Comparison: None.

CLINICAL DATA: Left lower leg pain and swelling for 1 month.  No
known injury.

LEFT TIBIA AND FIBULA - 2 VIEW

[t tib/fib ap left (1 of 2)]
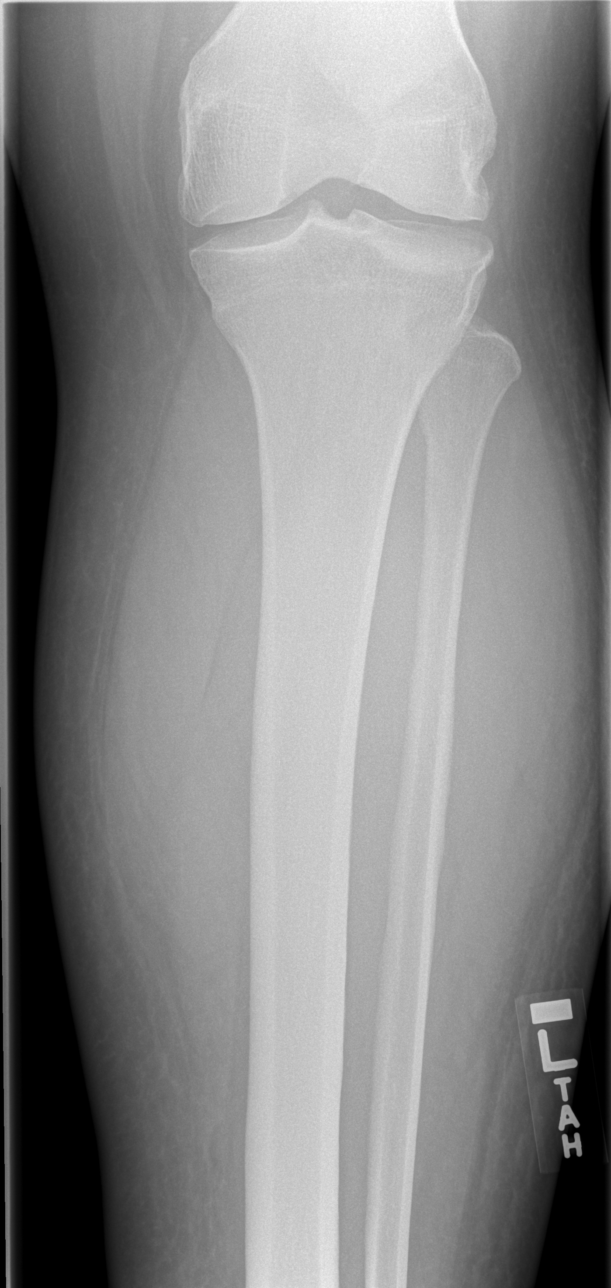

[t tib/fib ap left (2 of 2)]
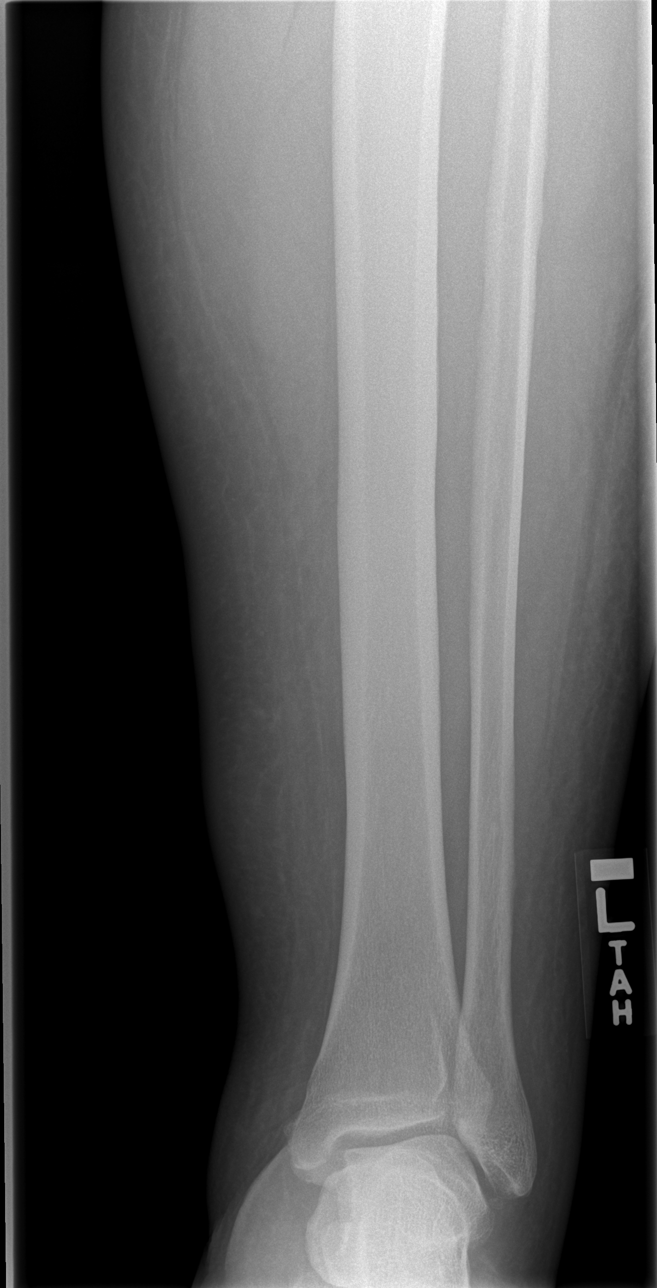

[t tib/fib lat left (1 of 2)]
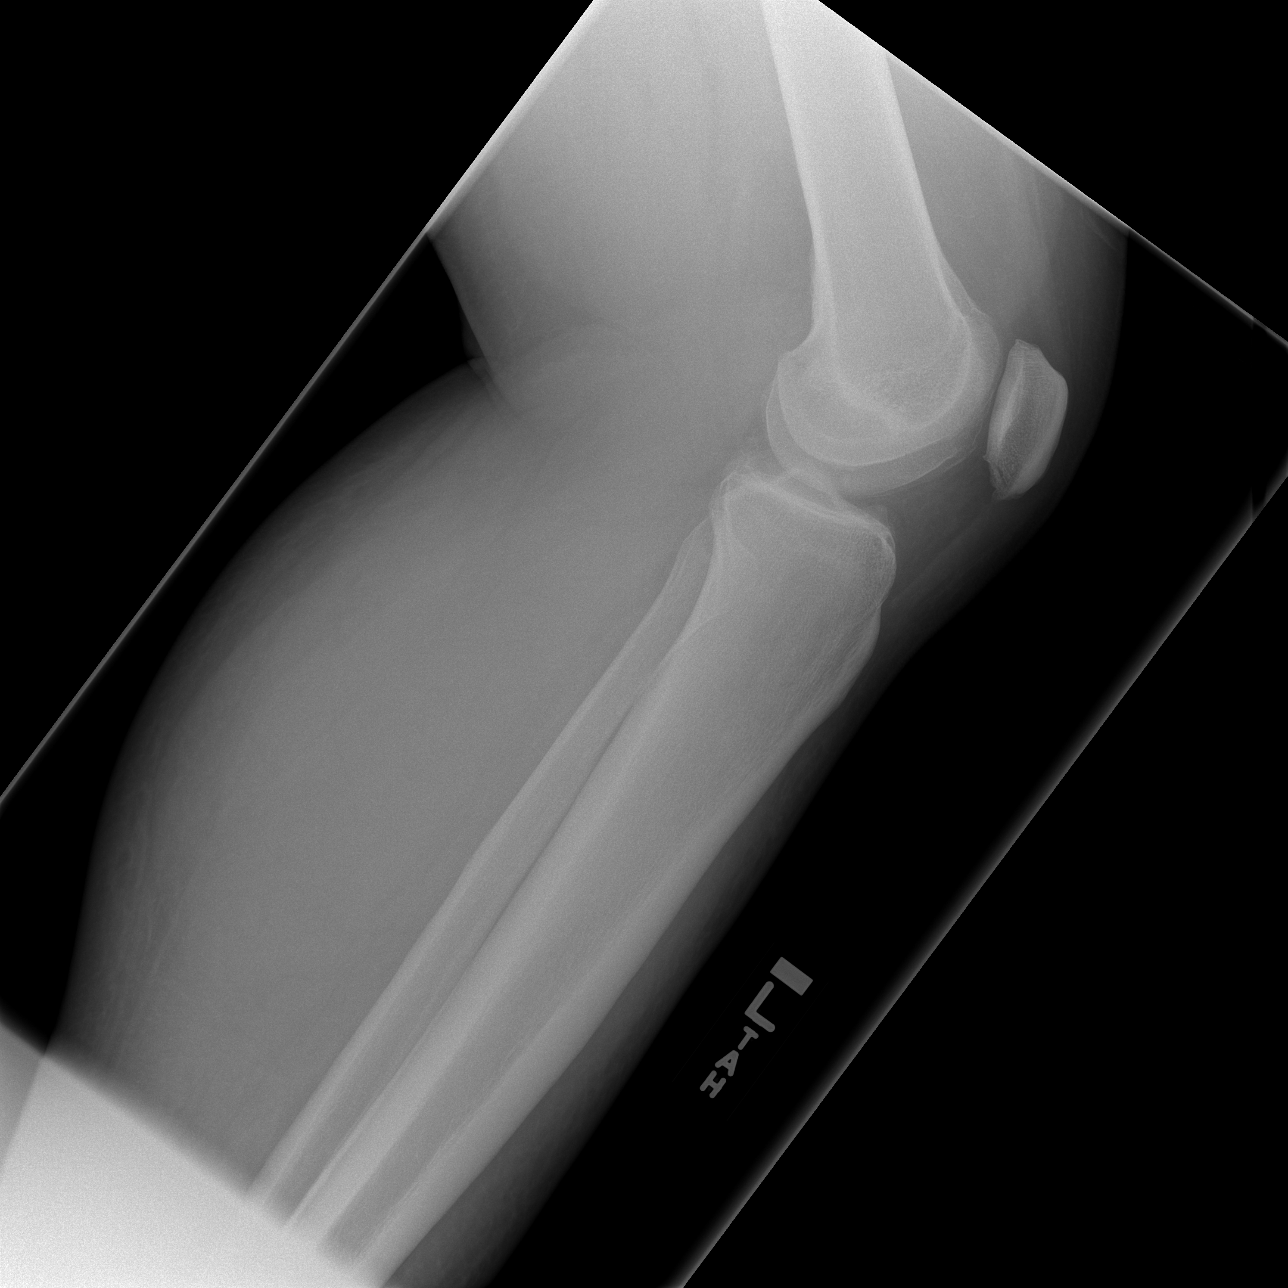

[t tib/fib lat left (2 of 2)]
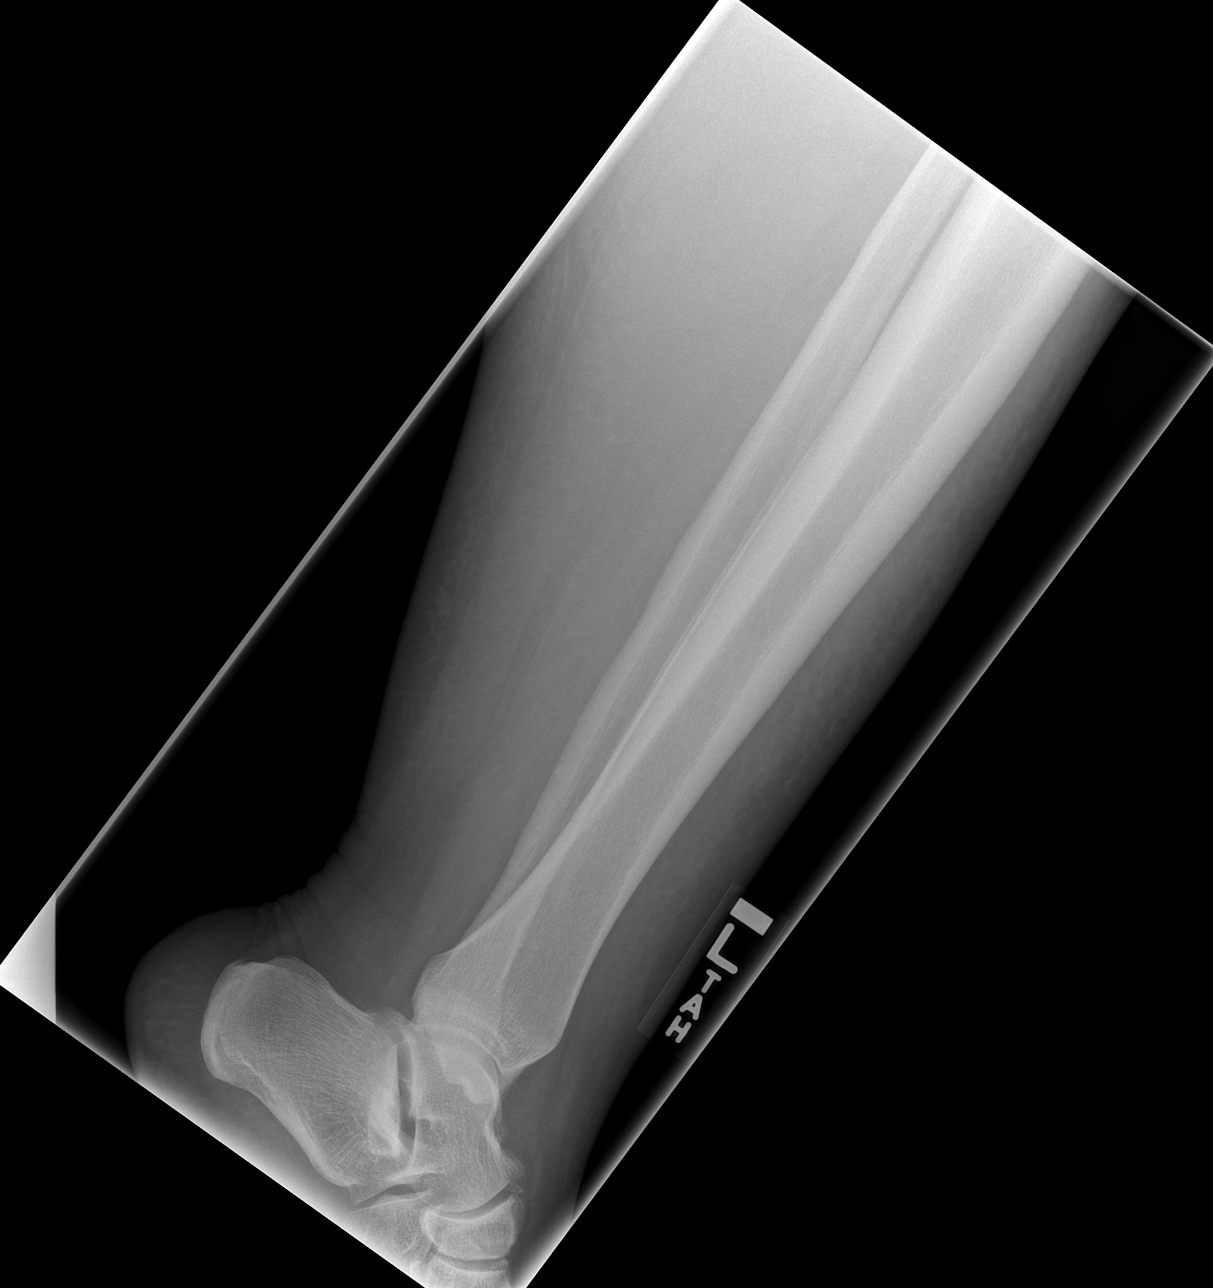

[4 of 4 positions shown; findings below may reference images not displayed]

FINDINGS: There is diffuse soft tissue swelling of the lower
extremity.  No evidence for acute fracture or subluxation.  No
radiopaque foreign body or soft tissue gas identified.
IMPRESSION: 1.  Diffuse soft tissue swelling.
2. No evidence for acute osseous abnormality.

## 2012-02-19 MED ORDER — NAPROXEN 500 MG PO TABS: 500.0000 mg | ORAL_TABLET | Freq: Two times a day (BID) | ORAL | Status: DC

## 2012-02-19 MED ORDER — KETOROLAC TROMETHAMINE 60 MG/2ML IM SOLN: 60.0000 mg | Freq: Once | INTRAMUSCULAR | Status: DC

## 2012-02-19 NOTE — ED Provider Notes (Signed)
History     CSN: [id number]  Arrival date & time 02/19/12  1027   First MD Initiated Contact with Patient 02/19/12 1041      Chief Complaint  Patient presents with  . Leg Pain    (Consider location/radiation/quality/duration/timing/severity/associated sxs/prior treatment) HPI  To the emergency department with complaints of lower left leg pain. Within the emergency department one week ago as it was nighttime therefore a d-dimer was done. In care for evaluation of his leg pain the doctor sent him to the ER for a positive Homans sign. He denies history of injury that may have caused his leg pain. He admits to swelling and increased pain when walking. A chest pain, shortness of breath, weakness, chills, fever. Had a 0/10 at rest and an 8/10 when touched. He states that his pain has been going on for the past month.   Past Medical History  Diagnosis Date  . HTN (hypertension)   . Sleep apnea   . GERD (gastroesophageal reflux disease)     Past Surgical History  Procedure Date  . Two knee surgeries 1995    Family History  Problem Relation Age of Onset  . Hypertension Mother   . Hypertension Father   . Hypertension Brother   . Hyperlipidemia Paternal Grandfather   . Diabetes Maternal Grandmother   . Heart failure Maternal Grandfather   . Heart failure Paternal Grandfather     History  Substance Use Topics  . Smoking status: Current Everyday Smoker -- 0.3 packs/day for 13 years    Types: Cigarettes  . Smokeless tobacco: Not on file  . Alcohol Use: Yes     occassional      Review of Systems   HEENT: denies blurry vision or change in hearing PULMONARY: Denies difficulty breathing and SOB CARDIAC: denies chest pain or heart palpitations MUSCULOSKELETAL:  denies being unable to ambulate ABDOMEN AL: denies abdominal pain GU: denies loss of bowel or urinary control NEURO: denies numbness and tingling in extremities   Allergies  Review of patient's allergies  indicates no known allergies.  Home Medications   Current Outpatient Rx  Name Route Sig Dispense Refill  . AMLODIPINE BESYLATE 10 MG PO TABS Oral Take 1 tablet (10 mg total) by mouth daily. 90 tablet 3  . LISINOPRIL 10 MG PO TABS Oral Take 1 tablet (10 mg total) by mouth daily. 90 tablet 3  . NAPROXEN 500 MG PO TABS Oral Take 1 tablet (500 mg total) by mouth 2 (two) times daily. 30 tablet 0    BP 139/78  Pulse 74  Temp(Src) 98.3 F (36.8 C) (Oral)  Resp 18  SpO2 96%  Physical Exam  Nursing note and vitals reviewed. Constitutional: He appears well-developed and well-nourished. No distress.  HENT:  Head: Normocephalic and atraumatic.  Eyes: Pupils are equal, round, and reactive to light.  Neck: Normal range of motion. Neck supple.  Cardiovascular: Normal rate and regular rhythm.   Pulmonary/Chest: Effort normal.  Abdominal: Soft.  Musculoskeletal:       Left lower leg: He exhibits tenderness and swelling. He exhibits no bony tenderness, no edema, no deformity and no laceration.        Equal strength to bilateral lower extremities. Neurosensory function adequate to both legs. Skin color is normal. Skin is warm and moist. I see no  deformity, no bony tenderness. Pt is able to ambulate without limp. Pain is relieved when resting.  No crepitus, laceration, effusion, swelling.  Pulses are normal   Neurological:  He is alert.  Skin: Skin is warm and dry.    ED Course  Procedures (including critical care time)  Labs Reviewed - No data to display No results found.   1. Muscle strain       MDM  Patient Xray and ulrasound discussed with patient. Both negative for any abnormalities. Pt given referral to Ortho for follow-up and management of his muscle strain.  Pt has been advised of the symptoms that warrant their return to the ED. Patient has voiced understanding and has agreed to follow-up with the PCP or specialist.         Linus Mako, [place] 02/19/12 1227

## 2012-02-19 NOTE — ED Provider Notes (Signed)
Medical screening examination/treatment/procedure(s) were performed by non-physician practitioner and as supervising physician I was immediately available for consultation/collaboration.   Kathalene Frames, MD 02/19/12 1228

## 2012-02-19 NOTE — ED Notes (Signed)
Shuttle not available at this time - explained pt be approx 5 min to transfer

## 2012-02-19 NOTE — ED Notes (Signed)
Pt with c/o left lower leg pain and swelling onset x one month - pain worse left lateral leg -  seen [facility] one week ago per pt told not a blood clot -

## 2012-02-19 NOTE — ED Provider Notes (Signed)
History     CSN: [id number]  Arrival date & time 02/19/12  [id number]   First MD Initiated Contact with Patient 02/19/12 [phone]      Chief Complaint  Patient presents with  . Leg Pain  . Leg Swelling    (Consider location/radiation/quality/duration/timing/severity/associated sxs/prior treatment) HPI Comments: Presents urgent care with ongoing and worsening left lower leg pain and swelling spindle and off for about a month. He was seen at the was [place] members apartment about a week ago her he was told that he had a negative test for a blood clot. (Blood test), feels his leg is getting worse and is more swollen and painful when he walks (patient points to left calf area)  Patient is a 31 y.o. male presenting with leg pain. The history is provided by the patient.  Leg Pain  The incident occurred more than 1 week ago. The incident occurred at home. There was no injury mechanism. The pain is at a severity of 5/10. The pain is moderate. The pain has been constant since onset. Associated symptoms include muscle weakness. Pertinent negatives include no numbness, no inability to bear weight, no loss of motion, no loss of sensation and no tingling. He reports no foreign bodies present. He has tried nothing for the symptoms. The treatment provided no relief.    Past Medical History  Diagnosis Date  . HTN (hypertension)   . Sleep apnea   . GERD (gastroesophageal reflux disease)     Past Surgical History  Procedure Date  . Two knee surgeries 1995    Family History  Problem Relation Age of Onset  . Hypertension Mother   . Hypertension Father   . Hypertension Brother   . Hyperlipidemia Paternal Grandfather   . Diabetes Maternal Grandmother   . Heart failure Maternal Grandfather   . Heart failure Paternal Grandfather     History  Substance Use Topics  . Smoking status: Current Everyday Smoker -- 0.3 packs/day for 13 years    Types: Cigarettes  . Smokeless tobacco: Not on file  . Alcohol  Use: Yes     occassional      Review of Systems  Constitutional: Negative for fever, chills and activity change.  Respiratory: Negative for shortness of breath.   Cardiovascular: Positive for leg swelling. Negative for chest pain and palpitations.  Skin: Negative for color change, rash and wound.  Neurological: Negative for tingling, weakness and numbness.    Allergies  Review of patient's allergies indicates no known allergies.  Home Medications   Current Outpatient Rx  Name Route Sig Dispense Refill  . AMLODIPINE BESYLATE 10 MG PO TABS Oral Take 1 tablet (10 mg total) by mouth daily. 90 tablet 3  . LISINOPRIL 10 MG PO TABS Oral Take 1 tablet (10 mg total) by mouth daily. 90 tablet 3  . NAPROXEN 500 MG PO TABS Oral Take 1 tablet (500 mg total) by mouth 2 (two) times daily. 30 tablet 0    BP 141/88  Pulse 80  Temp(Src) 97.7 F (36.5 C) (Oral)  Resp 20  SpO2 97%  Physical Exam  Nursing note and vitals reviewed. Constitutional: Vital signs are normal. He is active.  Non-toxic appearance. He does not have a sickly appearance. He does not appear ill.      ED Course  Procedures (including critical care time)  Labs Reviewed - No data to display No results found.   No diagnosis found.    MDM  Recurrent left lower leg pain.  Localize swelling for about a month. Patient has Ms. factors for DVT and has a positive Homans and patient was recently in the emergency department within: Where he had a negative d-dimer at that point. Patient has been transferred to the emergency department to rule out a DVT.        Rosana Hoes, MD 02/19/12 [phone]

## 2012-02-19 NOTE — Progress Notes (Signed)
VASCULAR LAB PRELIMINARY  PRELIMINARY  PRELIMINARY  PRELIMINARY  Left lower extremity venous Doppler completed.    Preliminary report:  There is no obvious evidence of DVT or SVT noted in the left lower extremity.  Calvin Bailey, 02/19/2012, 11:58 AM

## 2012-02-19 NOTE — ED Notes (Signed)
Sent from [facility] for leg pain persistent and positive homans sign sent for dvt work up

## 2012-02-19 NOTE — Discharge Instructions (Signed)
Sprains Sprains are painful injuries to joints as a result of partial or complete tearing of ligaments. HOME CARE INSTRUCTIONS   For the first 24 hours, keep the injured limb raised on 2 pillows while lying down.   Apply ice bags about every 2 hours for 20 to 30 minutes, while awake, to the injured area for the first 24 hours. Then apply as directed by your caregiver. Place the ice in a plastic bag with a towel around it to prevent frostbite to the skin.   Only take over-the-counter or prescription medicines for pain, discomfort, or fever as directed by your caregiver.   If an ace bandage (a stretchy, elastic wrapping bandage) has been applied today, remove and reapply every 3 to 4 hours. Apply firm enough to keep swelling down. Donot apply tightly. Watch fingers or toes for swelling, bluish discoloration, coldness, numbness, or excessive pain. If any of these problems (symptoms) occur, remove the ace bandage and reapply it more loosely. Contact your caregiver or return to this location if these symptoms persist.  Persistent pain and inability to use the injured area for more than 2 to 3 days are warning signs. See a caregiver for a follow-up visit as soon as possible. A hairline fracture (broken bone) may not show on X-rays. Persistent pain and swelling indicate that further evaluation, use of crutches, and/or more X-rays are needed. X-rays may sometimes not show a small fracture until a week or ten days later. Make a follow-up appointment with your own caregiver or to whom we have referred you. A specialist in reading X-rays(radiologist) will re-read your X-rays. Make sure you know how to obtain your X-ray results. Do not assume everything is normal if you do not hear from [place]. SEEK IMMEDIATE MEDICAL CARE IF:  You develop severe pain or more swelling.   The pain is not controlled with medicine.   Your skin or nails below the injury turn blue or grey or feel cold or numb.  Document Released:  09/10/2000 Document Revised: 09/02/2011 Document Reviewed: 04/29/2008 [employer] Patient Information 2012 [email or web address].Sprain A sprain is an injury to the soft tissue that connects adjacent bones across a joint (ligament), in which the ligament becomes stretched or torn. The purpose of ligaments is to prevent a joint from moving out side of its intended range of motion. The most common joints of the body to suffer from a sprain are the ankles, knees, and fingers. Sprains are classified into 3 categories: grade 1, grade 2, and grade 3. Grade 1 sprains cause pain, but the tendon is not lengthened. Grade 2 sprains include a lengthened ligament due to the ligament being stretched or partially ruptured. With grade 2 sprains there is still function, although the function may be diminished. Grade 3 sprains are marked by a complete tear of the ligament and the joint usually displays a loss of function.  SYMPTOMS   Pain and tenderness in the area of injury; severity varies with extent of injury.   Swelling of the affected joint (usually).   Redness or bruising in the area of injury, either immediately or several hours after the injury.   Loss of normal mobility of the injured joint.  CAUSES  A sprain may occur as a secondary injury to a traumatic event, such as a fall or twisting injury. The ankle is susceptible to sprains because of it is a mechanically weak joint and is exposed during athletic events. RISK INCREASES WITH:  Trauma, especially with high-risk activities, such as  sports with a lot of jumping, for knee and ankle sprains (basketball or volleyball); sports with a lot of pivoting motions, for knee sprains (skiing, soccer, or football); and contact sports.   Falls onto outstretched hands and wrists (wrist sprains).   Catching sports, such as water polo and baseball (finger sprains).   Poorly fitting and high-heeled shoes.   Poor field conditions.   Poor strength and flexibility.    Failure to warm-up properly before activity.  PREVENTION  Warm up and stretch properly before activity.   Maintain physical fitness:   Muscle strength.   Endurance and flexibility.   Cardiovascular fitness.   Wear properly fitted and padded protective equipment.   Wrap weak joints with support bandages before strenuous activity.  PROGNOSIS  If treated properly, sprains usually heal in 2 to 8 weeks. Occasionally sprains require surgery for healing to occur. RELATED COMPLICATIONS  Permanent instability of a joint if the sprain is severe or if a ligament is repeatedly sprained.   Arthritis of the joint.  TREATMENT Treatment involves ice and medicine to relieve pain and inflammation. Rest and immobilization of the injured joint is necessary for healing to occur. Strengthening and stretching exercises may be recommended after immobilization to regain strength and a full range of motion. For severe sprains surgery may be necessary to repair the injured ligament. MEDICATION  If pain medicine is necessary, then nonsteroidal anti-inflammatory medicines, such as aspirin and ibuprofen, or other minor pain relievers, such as acetaminophen, are often recommended.   Do not take pain medicine for 7 days before surgery.   Prescription pain relievers may be prescribed. Use only as directed and only as much as you need.   Cortisone injections are generally not advised for sprains. Cortisone may affect the healing of the ligament.  HEAT AND COLD  Cold treatment (icing) relieves pain and reduces inflammation. Cold treatment should be applied for 10 to 15 minutes every 2 to 3 hours for inflammation and pain and immediately after any activity that aggravates your symptoms. Use ice packs or massage the area with a piece of ice (ice massage).   Heat treatment may be used prior to performing the stretching and strengthening activities prescribed by your caregiver, physical therapist, or athletic  trainer. Use a heat pack or soak the injury in warm water.  SEEK MEDICAL CARE IF:  Symptoms get worse or do not improve in 2 to 6 weeks despite treatment. Document Released: 09/13/2005 Document Revised: 09/02/2011 Document Reviewed: 12/26/2008 [facility] Patient Information 2012 [place].

## 2012-03-18 ENCOUNTER — Other Ambulatory Visit: Payer: Self-pay | Admitting: Family Medicine

## 2012-03-21 ENCOUNTER — Ambulatory Visit (INDEPENDENT_AMBULATORY_CARE_PROVIDER_SITE_OTHER): Payer: Self-pay | Admitting: Family Medicine

## 2012-03-21 ENCOUNTER — Encounter: Payer: Self-pay | Admitting: Family Medicine

## 2012-03-21 VITALS — BP 147/85 | HR 77 | Ht 73.5 in | Wt >= 6400 oz

## 2012-03-21 DIAGNOSIS — I10 Essential (primary) hypertension: Secondary | ICD-10-CM

## 2012-03-21 MED ORDER — LISINOPRIL-HYDROCHLOROTHIAZIDE 20-12.5 MG PO TABS: 1.0000 | ORAL_TABLET | Freq: Every day | ORAL | Status: DC

## 2012-03-21 MED ORDER — AMLODIPINE BESYLATE 10 MG PO TABS: 10.0000 mg | ORAL_TABLET | Freq: Every day | ORAL | Status: DC

## 2012-03-21 NOTE — Progress Notes (Signed)
  Subjective:   Patient ID: Calvin Bailey, male DOB: [birth year] 31 y.o. MRN: [id number] HPI:  1.  HYPERTENSION Disease Monitoring Home BP Monitoring not doing Chest pain- no     Dyspnea-  no  Medications Compliance: taking as prescribed. (stopped carvedilol) Lightheadedness-  no  Edema-  yes  Salt intake: high, he eats fast food and doesn't check salt content.   History  Substance Use Topics  . Smoking status: Current Everyday Smoker -- 0.5 packs/day for 13 years    Types: Cigarettes  . Smokeless tobacco: Not on file  . Alcohol Use: Yes     occassional    Review of Systems: Pertinent items are noted in HPI.  Labs Reviewed: yes Reviewed Chart Review for last notes.     Objective:   Filed Vitals:   03/21/12 0929  BP: 147/85  Pulse: 77  Height: 6' 1.5" (1.867 m)  Weight: 403 lb 11.2 oz (183.117 kg)   Physical Exam: General: obese aam, nad, pleasant Lungs:  Normal respiratory effort, chest expands symmetrically. Lungs are clear to auscultation, no crackles or wheezes. Heart - Regular rate and rhythm.  No murmurs, gallops or rubs.    Extremities:   Non-tender, No cyanosis, 1+ edema, no deformity noted. Neck:  No deformities, thyromegaly, masses, or tenderness noted.   Supple with full range of motion without pain.  Assessment & Plan:

## 2012-03-21 NOTE — Assessment & Plan Note (Signed)
C/w amlodipine 10 mg daily. Changed lisinopril 10 mg to Lisinopril/Hctz 20/12.5 He says he stopped taking the carvedilol. I will see him back in two weeks to check this regimen.  Advised in depth salt intake precautions. BP Readings from Last 3 Encounters:  03/21/12 147/85  02/19/12 119/62  02/19/12 141/88

## 2012-03-21 NOTE — Patient Instructions (Signed)
Meds ordered this encounter  Medications  . lisinopril-hydrochlorothiazide (ZESTORETIC) 20-12.5 MG per tablet    Sig: Take 1 tablet by mouth daily.    Dispense:  90 tablet    Refill:  3  . amLODipine (NORVASC) 10 MG tablet    Sig: Take 1 tablet (10 mg total) by mouth daily.    Dispense:  90 tablet    Refill:  3   These are the only blood pressure meds you should be taking now.  I will see you back in 2 weeks for another check.

## 2012-04-04 ENCOUNTER — Ambulatory Visit: Payer: Self-pay | Admitting: Family Medicine

## 2012-04-10 ENCOUNTER — Ambulatory Visit: Payer: Self-pay | Admitting: Family Medicine

## 2012-04-11 ENCOUNTER — Ambulatory Visit: Payer: Self-pay | Admitting: Sports Medicine

## 2012-09-16 ENCOUNTER — Emergency Department (INDEPENDENT_AMBULATORY_CARE_PROVIDER_SITE_OTHER)
Admission: EM | Admit: 2012-09-16 | Discharge: 2012-09-16 | Disposition: A | Discharge status: Home / Self Care | Payer: Self-pay | Source: Home / Self Care | Attending: Emergency Medicine | Admitting: Emergency Medicine

## 2012-09-16 ENCOUNTER — Encounter (HOSPITAL_COMMUNITY): Payer: Self-pay | Admitting: Emergency Medicine

## 2012-09-16 DIAGNOSIS — J020 Streptococcal pharyngitis: Secondary | ICD-10-CM

## 2012-09-16 LAB — POCT RAPID STREP A: Streptococcus, Group A Screen (Direct): POSITIVE — AB

## 2012-09-16 MED ORDER — PENICILLIN G BENZATHINE 1200000 UNIT/2ML IM SUSP
INTRAMUSCULAR | Status: AC
  Filled 2012-09-16: qty 2

## 2012-09-16 MED ORDER — PENICILLIN G BENZATHINE 1200000 UNIT/2ML IM SUSP
1.2000 10*6.[IU] | Freq: Once | INTRAMUSCULAR | Status: AC
  Administered 2012-09-16: 1.2 10*6.[IU] via INTRAMUSCULAR

## 2012-09-16 NOTE — ED Notes (Signed)
Patient complains of sore throat x 3 days with fever. Patient states he has nausea and vomiting 3 days ago. Patient denies diarrhea.

## 2012-09-16 NOTE — ED Provider Notes (Signed)
Chief Complaint  Patient presents with  . Sore Throat    History of Present Illness:   Calvin Bailey is a 31 year old male who has had a three-day history of sore throat, fever up to 101, chills, nasal congestion, rhinorrhea, and nausea. He denies headache, swollen glands, cough, vomiting, or diarrhea. He has had no known exposures to strep, mono, or influenza. He has had no prior history of strep throat. He does have high blood pressure and takes lisinopril and amlodipine.  Review of Systems:  Other than as noted above, the patient denies any of the following symptoms. Systemic:  No fever, chills, sweats, fatigue, myalgias, headache, or anorexia. Eye:  No redness, pain or drainage. ENT:  No earache, ear congestion, nasal congestion, sneezing, rhinorrhea, sinus pressure, sinus pain, or post nasal drip. Lungs:  No cough, sputum production, wheezing, shortness of breath, or chest pain. GI:  No abdominal pain, nausea, vomiting, or diarrhea. Skin:  No rash or itching.  [place]:  Past medical history, family history, social history, meds, allergies, and nurse's notes were reviewed.  There is no known exposure to strep or mono.  No prior history of step or mono.  The patient denies use of tobacco.  Physical Exam:   Vital signs:  BP 138/80  Pulse 82  Temp 98.4 F (36.9 C) (Oral)  Resp 22  SpO2 98% General:  Alert, in no distress. Eye:  No conjunctival injection or drainage. Lids were normal. ENT:  TMs and canals were normal, without erythema or inflammation.  Nasal mucosa was clear and uncongested, without drainage.  Mucous membranes were moist.  Exam of pharynx reveals erythema, swelling, and spots of whitish exudate on the anterior tonsillar pillars and the tonsils.  There were no oral ulcerations or lesions. Neck:  Supple, no adenopathy, tenderness or mass. Lungs:  No respiratory distress.  Lungs were clear to auscultation, without wheezes, rales or rhonchi.  Breath sounds were clear and equal  bilaterally.  Heart:  Regular rhythm, without gallops, murmers or rubs. Skin:  Clear, warm, and dry, without rash or lesions.  Labs:   Results for orders placed during the hospital encounter of 09/16/12  POCT RAPID STREP A ([facility] CARE ONLY)      Component Value Range   Streptococcus, Group A Screen (Direct) POSITIVE (*) NEGATIVE   Course in Urgent Care Center:   He was given CR Bicillin 1.2 milliunits IM and tolerated this well without any immediate side effects.  Assessment:  The encounter diagnosis was Strep throat.  Plan:   1.  The following meds were prescribed:   New Prescriptions   No medications on file   2.  The patient was instructed in symptomatic care including hot saline gargles, throat lozenges, infectious precautions, and need to trade out toothbrush. Handouts were given. 3.  The patient was told to return if becoming worse in any way, if no better in 3 or 4 days, and given some red flag symptoms that would indicate earlier return.    Harden Mo, MD 09/16/12 [phone]

## 2012-12-15 ENCOUNTER — Emergency Department (HOSPITAL_COMMUNITY)
Admission: EM | Admit: 2012-12-15 | Discharge: 2012-12-15 | Disposition: A | Discharge status: Home / Self Care | Payer: Self-pay | Attending: Emergency Medicine | Admitting: Emergency Medicine

## 2012-12-15 ENCOUNTER — Emergency Department (HOSPITAL_COMMUNITY): Payer: Self-pay

## 2012-12-15 ENCOUNTER — Encounter (HOSPITAL_COMMUNITY): Payer: Self-pay | Admitting: Emergency Medicine

## 2012-12-15 DIAGNOSIS — Z8669 Personal history of other diseases of the nervous system and sense organs: Secondary | ICD-10-CM | POA: Insufficient documentation

## 2012-12-15 DIAGNOSIS — J209 Acute bronchitis, unspecified: Secondary | ICD-10-CM | POA: Insufficient documentation

## 2012-12-15 DIAGNOSIS — Z8719 Personal history of other diseases of the digestive system: Secondary | ICD-10-CM | POA: Insufficient documentation

## 2012-12-15 DIAGNOSIS — J40 Bronchitis, not specified as acute or chronic: Secondary | ICD-10-CM

## 2012-12-15 DIAGNOSIS — F17200 Nicotine dependence, unspecified, uncomplicated: Secondary | ICD-10-CM | POA: Insufficient documentation

## 2012-12-15 DIAGNOSIS — Z79899 Other long term (current) drug therapy: Secondary | ICD-10-CM | POA: Insufficient documentation

## 2012-12-15 DIAGNOSIS — I10 Essential (primary) hypertension: Secondary | ICD-10-CM | POA: Insufficient documentation

## 2012-12-15 IMAGING — CR DG CHEST 2V
2 series · 2 of 2 positions shown · non-contrast
Comparison: [DATE]

CLINICAL DATA: Cough

CHEST - 2 VIEW

[w chest pa]
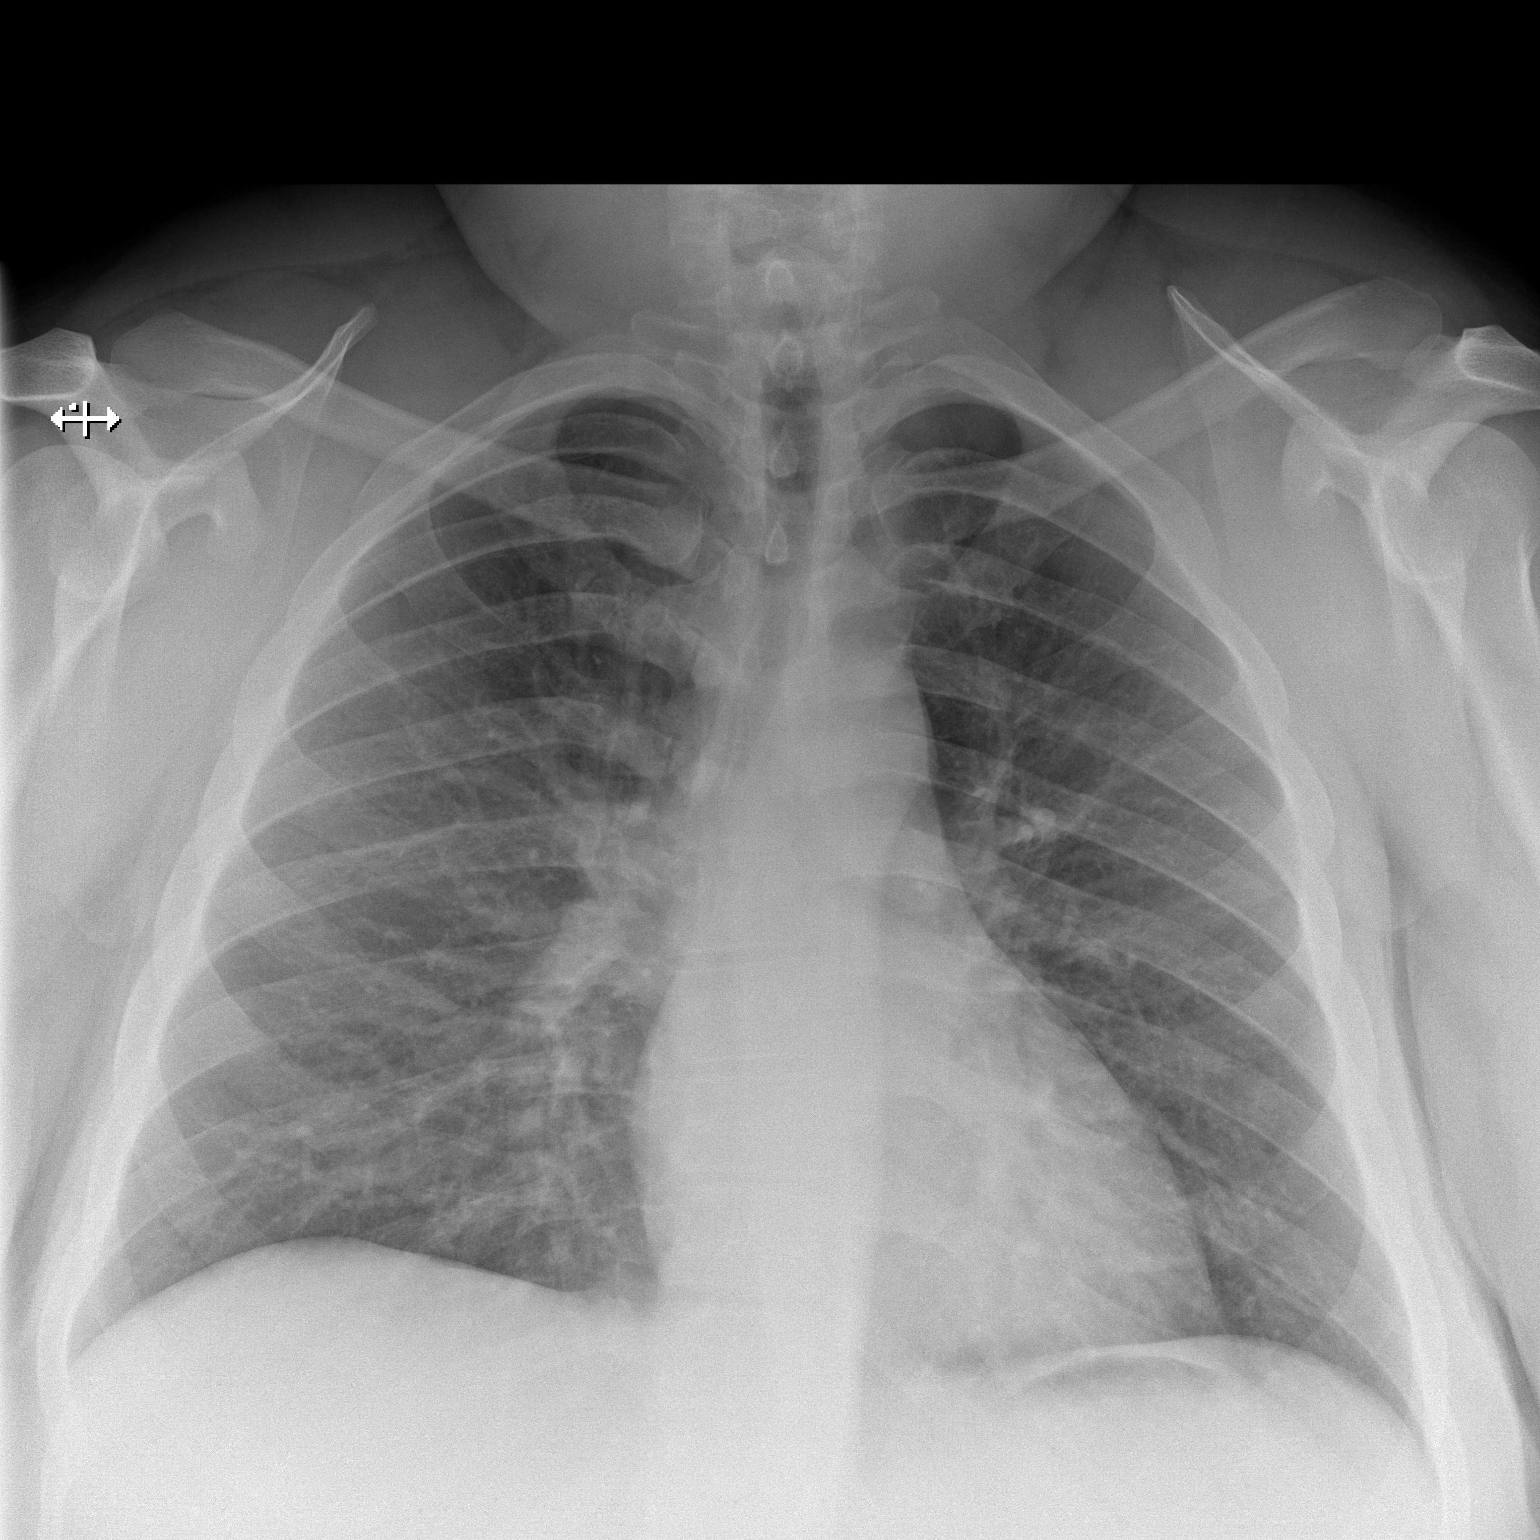

[w chest lat]
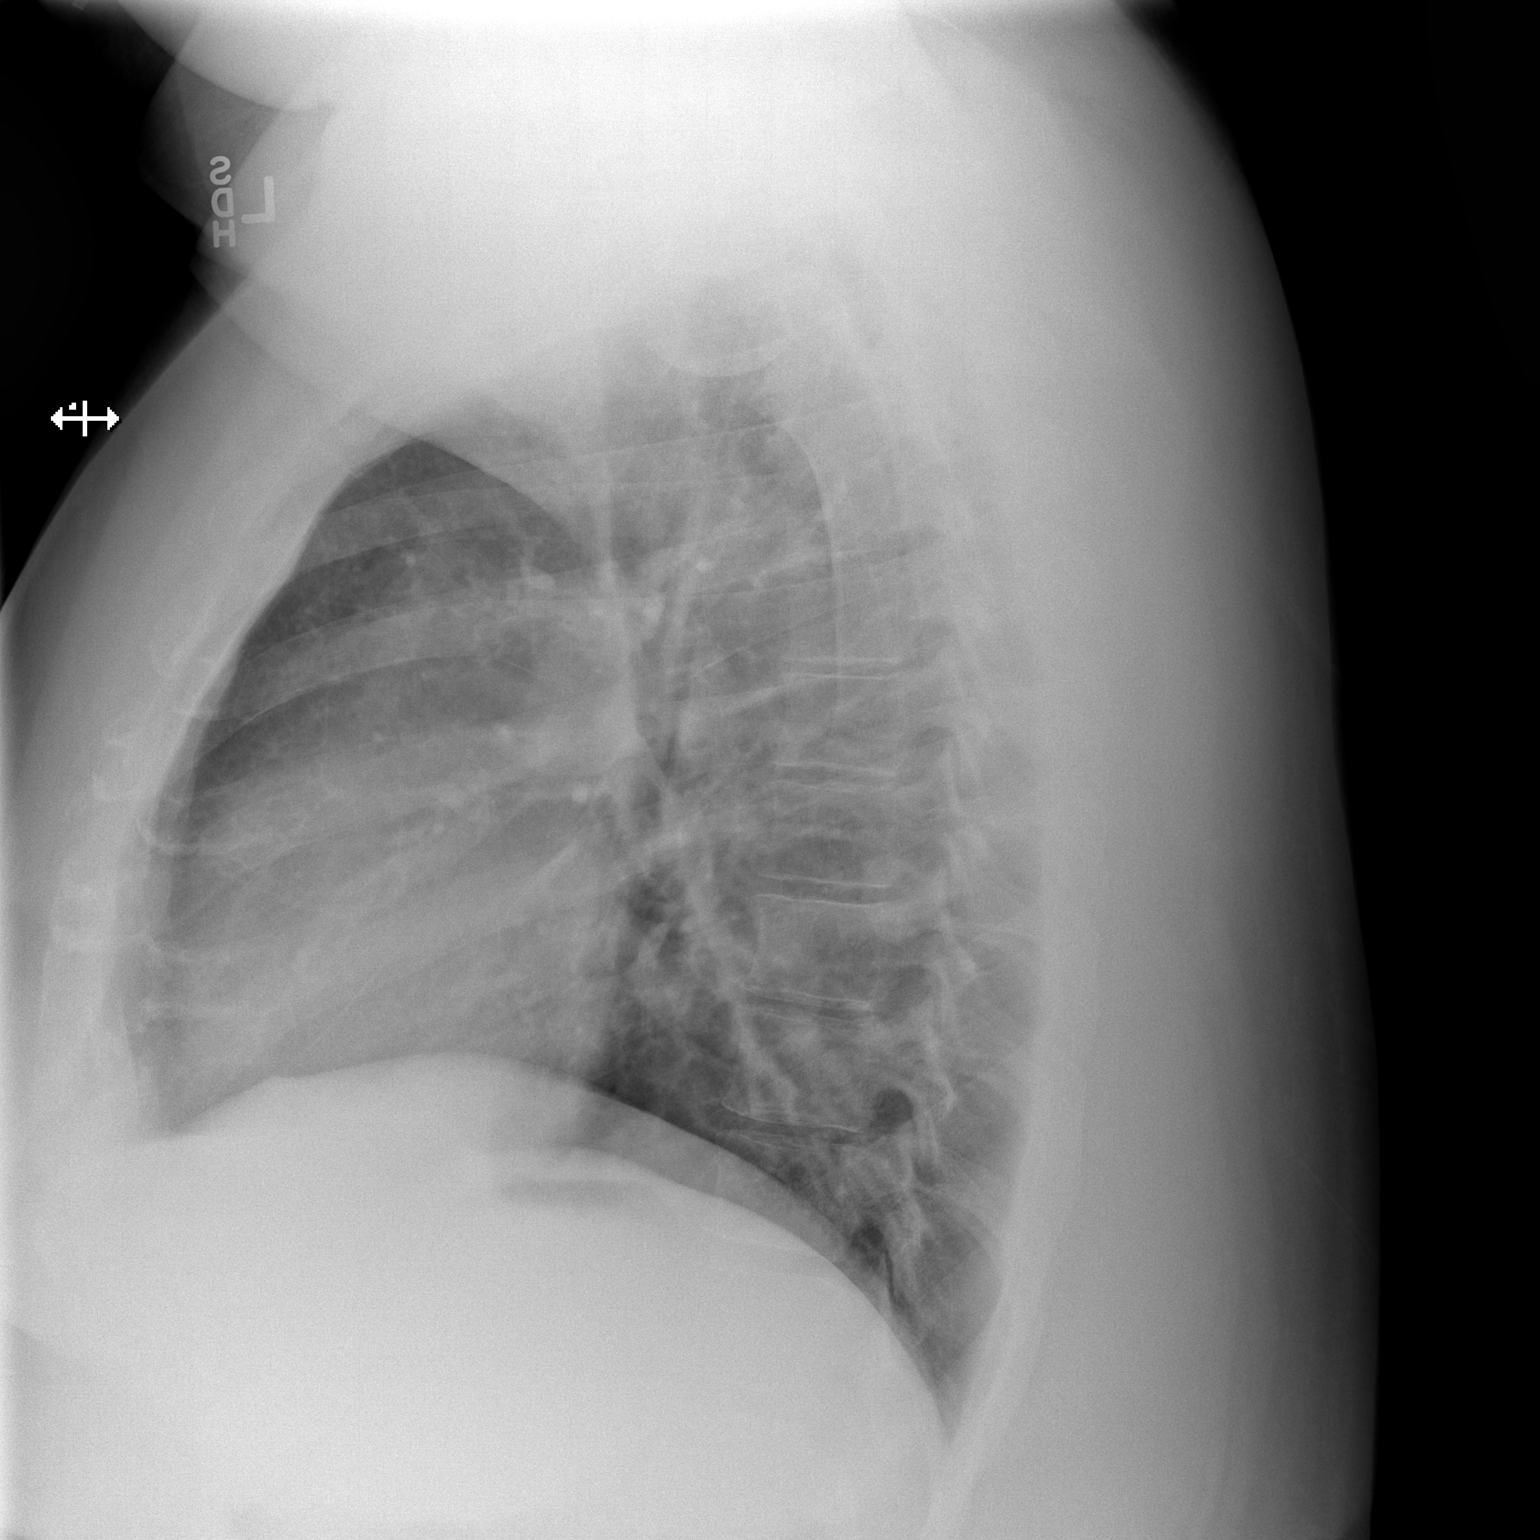

[2 of 2 positions shown; findings below may reference images not displayed]

FINDINGS: Cardiomediastinal silhouette is stable.  No acute
infiltrate or pulmonary edema.  Mild perihilar increased bronchial
markings suspicious for bronchitic changes.  Bony thorax is stable.
IMPRESSION: No acute infiltrate or pulmonary edema.  Mild perihilar increased
bronchial markings suspicious for bronchitic changes.

## 2012-12-15 MED ORDER — ALBUTEROL SULFATE HFA 108 (90 BASE) MCG/ACT IN AERS: 2.0000 | INHALATION_SPRAY | RESPIRATORY_TRACT | Status: DC | PRN

## 2012-12-15 MED ORDER — PREDNISONE (PAK) 10 MG PO TABS: 10.0000 mg | ORAL_TABLET | Freq: Every day | ORAL | Status: DC

## 2012-12-15 MED ORDER — AZITHROMYCIN 250 MG PO TABS: 250.0000 mg | ORAL_TABLET | Freq: Every day | ORAL | Status: DC

## 2012-12-15 NOTE — ED Provider Notes (Signed)
History    This chart was scribed for non-physician practitioner working with Saddie Benders. Dorna Mai, MD by Ludger Nutting, ED Scribe. This patient was seen in room WTR8/WTR8 and the patient's care was started at 2024.   CSN: [id number]  Arrival date & time 12/15/12  2024   First MD Initiated Contact with Patient 12/15/12 2114      Chief Complaint  Patient presents with  . Cough     The history is provided by the patient. No language interpreter was used.    GURBIR MENDER is a 32 y.o. male who presents to the Emergency Department complaining of new, intermittent, productive cough onset 1.5 weeks ago. Pt states he had a cold and took medicine for it. The sore throat and congestion symptoms went away except for the cough. States the cough is unchanged and is worse at night and in the morning. Pt states he is concerned about recurring pneumonia. He denies fever, chest pain, SOB.  Pt is a smoker.   Pt is a current everyday smoker and occasional alcohol user.  Past Medical History  Diagnosis Date  . HTN (hypertension)   . Sleep apnea   . GERD (gastroesophageal reflux disease)     Past Surgical History  Procedure Laterality Date  . Two knee surgeries  1995    Family History  Problem Relation Age of Onset  . Hypertension Mother   . Hypertension Father   . Hypertension Brother   . Hyperlipidemia Paternal Grandfather   . Diabetes Maternal Grandmother   . Heart failure Maternal Grandfather   . Heart failure Paternal Grandfather     History  Substance Use Topics  . Smoking status: Current Every Day Smoker -- 0.50 packs/day for 13 years    Types: Cigarettes  . Smokeless tobacco: Not on file  . Alcohol Use: Yes     Comment: occassional      Review of Systems  Constitutional: Negative for fever.  Respiratory: Positive for cough. Negative for shortness of breath.   Cardiovascular: Negative for chest pain.    Allergies  Review of patient's allergies indicates no known  allergies.  Home Medications   Current Outpatient Rx  Name  Route  Sig  Dispense  Refill  . amLODipine (NORVASC) 10 MG tablet   Oral   Take 1 tablet (10 mg total) by mouth daily.   90 tablet   3   . lisinopril-hydrochlorothiazide (ZESTORETIC) 20-12.5 MG per tablet   Oral   Take 1 tablet by mouth daily.   90 tablet   3     BP 161/76  Pulse 90  Temp(Src) 98.1 F (36.7 C) (Oral)  Resp 20  Ht 6\' 2"  (1.88 m)  Wt 350 lb (158.759 kg)  BMI 44.92 kg/m2  SpO2 98%  Physical Exam  Nursing note and vitals reviewed. Constitutional: He appears well-developed and well-nourished. No distress.  HENT:  Head: Normocephalic and atraumatic.  Neck: Neck supple.  Cardiovascular: Normal rate and regular rhythm.   Pulmonary/Chest: Effort normal and breath sounds normal. No respiratory distress. He has no wheezes. He has no rales.  Neurological: He is alert.  Skin: He is not diaphoretic.  Psychiatric: He has a normal mood and affect. His behavior is normal.    ED Course  Procedures (including critical care time)  DIAGNOSTIC STUDIES: Oxygen Saturation is 98% on room air, normal by my interpretation.    COORDINATION OF CARE: 9:26 PM Discussed treatment plan with pt at bedside and pt agreed to  plan.    Labs Reviewed - No data to display Dg Chest 2 View  12/15/2012  *RADIOLOGY REPORT*  Clinical Data: Cough  CHEST - 2 VIEW  Comparison: 06/13/2011  Findings: Cardiomediastinal silhouette is stable.  No acute infiltrate or pulmonary edema.  Mild perihilar increased bronchial markings suspicious for bronchitic changes.  Bony thorax is stable.  IMPRESSION: No acute infiltrate or pulmonary edema.  Mild perihilar increased bronchial markings suspicious for bronchitic changes.   Original Report Authenticated By: Lahoma Crocker, M.D.      1. Bronchitis       MDM  Pt with 10+ days of cough, not improving.  Pt is a smoker.  CXR negative.  Likely bronchitis.  Given length of illness, likely bacterial  component.  D/C with albuterol, steroids, z-pak, PCP follow up.  Discussed diagnosis and plan with patient.  Pt given return precautions.  Pt verbalizes understanding and agrees with plan.        I personally performed the services described in this documentation, which was scribed in my presence. The recorded information has been reviewed and is accurate.   Elrod Bibles, PA-C 12/15/12 2316

## 2012-12-15 NOTE — ED Provider Notes (Signed)
Medical screening examination/treatment/procedure(s) were performed by non-physician practitioner and as supervising physician I was immediately available for consultation/collaboration.  Saddie Benders. Dorna Mai, MD 12/15/12 2342

## 2012-12-15 NOTE — ED Notes (Signed)
Pt c/o intermittently productive cough x 1.5 weeks. Pt states he is concerned about recurring PNA.

## 2013-05-03 ENCOUNTER — Emergency Department (HOSPITAL_COMMUNITY)
Admission: EM | Admit: 2013-05-03 | Discharge: 2013-05-03 | Disposition: A | Discharge status: Home / Self Care | Payer: Self-pay | Source: Home / Self Care | Attending: Family Medicine | Admitting: Family Medicine

## 2013-05-03 ENCOUNTER — Encounter (HOSPITAL_COMMUNITY): Payer: Self-pay | Admitting: Emergency Medicine

## 2013-05-03 DIAGNOSIS — B353 Tinea pedis: Secondary | ICD-10-CM

## 2013-05-03 DIAGNOSIS — L089 Local infection of the skin and subcutaneous tissue, unspecified: Secondary | ICD-10-CM

## 2013-05-03 LAB — GLUCOSE, CAPILLARY: Glucose-Capillary: 81 mg/dL (ref 70–99)

## 2013-05-03 MED ORDER — ALUM SULFATE-CA ACETATE EX PACK: PACK | CUTANEOUS | Status: DC

## 2013-05-03 MED ORDER — FLUCONAZOLE 200 MG PO TABS: ORAL_TABLET | ORAL | Status: DC

## 2013-05-03 MED ORDER — TERBINAFINE HCL 1 % EX CREA: TOPICAL_CREAM | Freq: Two times a day (BID) | CUTANEOUS | Status: DC

## 2013-05-03 MED ORDER — CEPHALEXIN 500 MG PO CAPS: 500.0000 mg | ORAL_CAPSULE | Freq: Two times a day (BID) | ORAL | Status: DC

## 2013-05-03 NOTE — ED Provider Notes (Signed)
And a a and and you in a for a a a and is usually when CSN: [id number]     Arrival date & time 05/03/13  1638 History     First MD Initiated Contact with Patient 05/03/13 1706     Chief Complaint  Patient presents with  . Foot Pain   (Consider location/radiation/quality/duration/timing/severity/associated sxs/prior Treatment) HPI Comments: 32 year old morbidly obese male with no history of diabetes. Here complaining of left foot pain between first and second toe for 3 days. Has not noticed any redness or drainage. No known injury. Patient currently wearing narrow sneaker shoes.   Past Medical History  Diagnosis Date  . HTN (hypertension)   . Sleep apnea   . GERD (gastroesophageal reflux disease)    Past Surgical History  Procedure Laterality Date  . Two knee surgeries Left 1995   Family History  Problem Relation Age of Onset  . Hypertension Mother   . Hypertension Father   . Hypertension Brother   . Hyperlipidemia Paternal Grandfather   . Diabetes Maternal Grandmother   . Heart failure Maternal Grandfather   . Heart failure Paternal Grandfather    History  Substance Use Topics  . Smoking status: Current Every Day Smoker -- 0.50 packs/day for 13 years    Types: Cigarettes  . Smokeless tobacco: Not on file  . Alcohol Use: Yes     Comment: occassional    Review of Systems  Constitutional: Negative for fever, chills and appetite change.  Gastrointestinal: Negative for nausea.  Endocrine: Negative for polydipsia, polyphagia and polyuria.  All other systems reviewed and are negative.    Allergies  Review of patient's allergies indicates no known allergies.  Home Medications   Current Outpatient Rx  Name  Route  Sig  Dispense  Refill  . LISINOPRIL-HYDROCHLOROTHIAZIDE PO   Oral   Take by mouth.         Marland Kitchen albuterol (PROVENTIL HFA;VENTOLIN HFA) 108 (90 BASE) MCG/ACT inhaler   Inhalation   Inhale 2 puffs into the lungs every 4 (four) hours as needed for wheezing  or shortness of breath (or cough).   1 Inhaler   0   . aluminum sulfate-calcium acetate (DOMEBORO) packet      Soak your feet at least once a day for 15-30 min.   100 each   0   . amLODipine (NORVASC) 10 MG tablet   Oral   Take 1 tablet (10 mg total) by mouth daily.   90 tablet   3   . cephALEXin (KEFLEX) 500 MG capsule   Oral   Take 1 capsule (500 mg total) by mouth 2 (two) times daily.   14 capsule   0   . fluconazole (DIFLUCAN) 200 MG tablet      1 tab weekly for 4   4 tablet   0   . EXPIRED: lisinopril-hydrochlorothiazide (ZESTORETIC) 20-12.5 MG per tablet   Oral   Take 1 tablet by mouth daily.   90 tablet   3   . terbinafine (LAMISIL) 1 % cream   Topical   Apply topically 2 (two) times daily.   30 g   0    BP 138/84  Pulse 74  Temp(Src) 98.1 F (36.7 C) (Oral)  Resp 21  SpO2 94% Physical Exam  Nursing note and vitals reviewed. Constitutional: He is oriented to person, place, and time. No distress.  Morbidly obese  HENT:  Head: Normocephalic and atraumatic.  Cardiovascular: Normal heart sounds.   Pulmonary/Chest: Breath sounds  normal.  Neurological: He is alert and oriented to person, place, and time.  Skin: He is not diaphoretic.  There is extensive maceration between all toes of the left foot worse between first and second toe also associated with skin brake, and yrllow exudate mixed with small amount of blood. There is associated tenderness to palpation above the interdigital web between 1st and 2nd toe with no associated erythema, induration or fluctuation.     ED Course   Procedures (including critical care time)  Labs Reviewed  GLUCOSE, CAPILLARY   No results found. 1. Tinea pedis   2. Left foot infection     MDM  Impress over infection of a macerated interdigital web between first and second toe. Likely also concomitant tinea pedis. Prescribed domeboro packets for soaking , Keflex and Diflucan oral and terbinafine cream. Placed on an  open post op shoe.  Supportive care and red flags that should prompt return to medical attention discussed with patient and provided in writing.     Randa Spike, MD 05/05/13 [phone]

## 2013-05-03 NOTE — ED Notes (Signed)
Left foot pain , onset 3 days ago.  No known injury.  Pain between great toe and the next toe.  Denies hurting in this location before, no obvious injury noted.  Patient reports this leg/foot stays swollen x 1 year

## 2013-05-21 ENCOUNTER — Emergency Department (INDEPENDENT_AMBULATORY_CARE_PROVIDER_SITE_OTHER)
Admission: EM | Admit: 2013-05-21 | Discharge: 2013-05-21 | Disposition: A | Discharge status: Home / Self Care | Payer: Self-pay | Source: Home / Self Care | Attending: Family Medicine | Admitting: Family Medicine

## 2013-05-21 ENCOUNTER — Encounter (HOSPITAL_COMMUNITY): Payer: Self-pay | Admitting: Emergency Medicine

## 2013-05-21 DIAGNOSIS — I10 Essential (primary) hypertension: Secondary | ICD-10-CM

## 2013-05-21 DIAGNOSIS — R7303 Prediabetes: Secondary | ICD-10-CM

## 2013-05-21 DIAGNOSIS — R7309 Other abnormal glucose: Secondary | ICD-10-CM

## 2013-05-21 LAB — POCT I-STAT, CHEM 8
BUN: 9 mg/dL (ref 6–23)
Chloride: 101 mEq/L (ref 96–112)
Creatinine, Ser: 1.2 mg/dL (ref 0.50–1.35)
Sodium: 142 mEq/L (ref 135–145)

## 2013-05-21 MED ORDER — AMLODIPINE BESYLATE 10 MG PO TABS: 10.0000 mg | ORAL_TABLET | Freq: Every day | ORAL | Status: DC

## 2013-05-21 MED ORDER — LISINOPRIL 10 MG PO TABS: 10.0000 mg | ORAL_TABLET | Freq: Every day | ORAL | Status: DC

## 2013-05-21 NOTE — ED Notes (Signed)
Pt has not used any otc meds for head ache.

## 2013-05-21 NOTE — ED Provider Notes (Signed)
CSN: [id number]     Arrival date & time 05/21/13  1051 History     First MD Initiated Contact with Patient 05/21/13 1225     Chief Complaint  Patient presents with  . Hypertension    out of meds x 1 wk. new insurance starts on wednesday.    (Consider location/radiation/quality/duration/timing/severity/associated sxs/prior Treatment) HPI Comments: Pt ran out meds a week ago and has been dizzy on occasion, blurred vision on occasion, and having intermittent headaches since.  States gets these sx when doesn't take bp meds. Not dizzy and doesn't have headache at this time.   Patient is a 32 y.o. male presenting with hypertension. The history is provided by the patient.  Hypertension This is a chronic problem. Episode onset: 1 week ago when ran out of meds. The problem occurs constantly. The problem has not changed since onset.Associated symptoms include headaches. Pertinent negatives include no chest pain and no shortness of breath. Nothing aggravates the symptoms. Nothing relieves the symptoms. He has tried nothing for the symptoms.    Past Medical History  Diagnosis Date  . HTN (hypertension)   . Sleep apnea   . GERD (gastroesophageal reflux disease)    Past Surgical History  Procedure Laterality Date  . Two knee surgeries Left 1995   Family History  Problem Relation Age of Onset  . Hypertension Mother   . Hypertension Father   . Hypertension Brother   . Hyperlipidemia Paternal Grandfather   . Diabetes Maternal Grandmother   . Heart failure Maternal Grandfather   . Heart failure Paternal Grandfather    History  Substance Use Topics  . Smoking status: Current Every Day Smoker -- 0.50 packs/day for 13 years    Types: Cigarettes  . Smokeless tobacco: Not on file  . Alcohol Use: Yes     Comment: occassional    Review of Systems  Eyes: Positive for visual disturbance.  Respiratory: Negative for shortness of breath.   Cardiovascular: Negative for chest pain.  Neurological:  Positive for dizziness and headaches.    Allergies  Review of patient's allergies indicates no known allergies.  Home Medications   Current Outpatient Rx  Name  Route  Sig  Dispense  Refill  . albuterol (PROVENTIL HFA;VENTOLIN HFA) 108 (90 BASE) MCG/ACT inhaler   Inhalation   Inhale 2 puffs into the lungs every 4 (four) hours as needed for wheezing or shortness of breath (or cough).   1 Inhaler   0   . aluminum sulfate-calcium acetate (DOMEBORO) packet      Soak your feet at least once a day for 15-30 min.   100 each   0   . amLODipine (NORVASC) 10 MG tablet   Oral   Take 1 tablet (10 mg total) by mouth daily.   30 tablet   2   . cephALEXin (KEFLEX) 500 MG capsule   Oral   Take 1 capsule (500 mg total) by mouth 2 (two) times daily.   14 capsule   0   . fluconazole (DIFLUCAN) 200 MG tablet      1 tab weekly for 4   4 tablet   0   . lisinopril (PRINIVIL,ZESTRIL) 10 MG tablet   Oral   Take 1 tablet (10 mg total) by mouth daily.   30 tablet   2   . terbinafine (LAMISIL) 1 % cream   Topical   Apply topically 2 (two) times daily.   30 g   0    BP 150/85  Pulse 82  Temp(Src) 98.4 F (36.9 C) (Oral)  Resp 16  SpO2 98% Physical Exam  Constitutional: He appears well-developed and well-nourished. No distress.  Morbidly obese  Eyes:  Visual acuity normal: 20/25 in each eye, and 20/20 both eyes.   Cardiovascular: Normal rate and regular rhythm.   Pulmonary/Chest: Effort normal and breath sounds normal.    ED Course   Procedures (including critical care time)  Labs Reviewed  POCT I-STAT, CHEM 8 - Abnormal; Notable for the following:    Potassium 3.4 (*)    Glucose, Bld 135 (*)    All other components within normal limits   No results found. 1. Hypertension   2. Prediabetes     MDM  Discussed prediabetes with pt. Gave brief overview of diet changes needed. Reviewed eating potassium rich foods for next week since K was 3.4. Rx lisinopril 10mg  daily  #30, 2 refills, and amlodipine 10mg  daily, #30, 2 refills for HTN (this is what pt has been taking according to bottles he has with him). Pt to find regular doctor (gets insurance in 2 days) and discuss prediabetes with new doctor.   Carvel Getting, NP 05/21/13 [phone]

## 2013-05-21 NOTE — ED Notes (Signed)
Reports out of BP meds. Having blurred and spotting vision with headaches. Pt states that new insurance starts on Wednesday day.  Pt has been with out meds x 1 wk. Denies any other symptoms.

## 2013-05-22 NOTE — ED Provider Notes (Signed)
Medical screening examination/treatment/procedure(s) were performed by non-physician practitioner and as supervising physician I was immediately available for consultation/collaboration.   [facility]; MD  Randa Spike, MD 05/22/13 [phone]

## 2013-06-01 ENCOUNTER — Ambulatory Visit: Payer: Self-pay | Admitting: Family Medicine

## 2013-07-13 ENCOUNTER — Encounter (HOSPITAL_COMMUNITY): Payer: Self-pay | Admitting: Emergency Medicine

## 2013-07-13 ENCOUNTER — Emergency Department (HOSPITAL_COMMUNITY)
Admission: EM | Admit: 2013-07-13 | Discharge: 2013-07-14 | Disposition: A | Discharge status: Home / Self Care | Payer: BC Managed Care – PPO | Attending: Emergency Medicine | Admitting: Emergency Medicine

## 2013-07-13 DIAGNOSIS — Z79899 Other long term (current) drug therapy: Secondary | ICD-10-CM | POA: Insufficient documentation

## 2013-07-13 DIAGNOSIS — Z8719 Personal history of other diseases of the digestive system: Secondary | ICD-10-CM | POA: Insufficient documentation

## 2013-07-13 DIAGNOSIS — F17200 Nicotine dependence, unspecified, uncomplicated: Secondary | ICD-10-CM | POA: Insufficient documentation

## 2013-07-13 DIAGNOSIS — R209 Unspecified disturbances of skin sensation: Secondary | ICD-10-CM | POA: Insufficient documentation

## 2013-07-13 DIAGNOSIS — I10 Essential (primary) hypertension: Secondary | ICD-10-CM | POA: Insufficient documentation

## 2013-07-13 DIAGNOSIS — M5412 Radiculopathy, cervical region: Secondary | ICD-10-CM | POA: Insufficient documentation

## 2013-07-13 NOTE — ED Notes (Signed)
Pt c/o pain/numbness L arm x 2 days worse tonight. Denies CP NAD, A & O

## 2013-07-14 MED ORDER — HYDROCODONE-ACETAMINOPHEN 5-325 MG PO TABS: 1.0000 | ORAL_TABLET | ORAL | Status: DC | PRN

## 2013-07-14 NOTE — ED Provider Notes (Signed)
CSN: [id number]     Arrival date & time 07/13/13  2347 History   First MD Initiated Contact with Patient 07/13/13 2359     Chief Complaint  Patient presents with  . Arm Pain   (Consider location/radiation/quality/duration/timing/severity/associated sxs/prior Treatment) HPI History provided by pt.   Pt c/o 3d intermittent episodes of diffuse LUE paresthesias and pain.  No known trigger, including exertion.   Paresthesias last 1 hour or longer and the pain is brief, lasting 1-2 minutes.   Some improvement w/ lateral bending of neck to the right.  No associated fever, neck pain, symptoms in other extremities, skin changes, CP, SOB.  Denies trauma.  No h/o neck problems.  Past Medical History  Diagnosis Date  . HTN (hypertension)   . Sleep apnea   . GERD (gastroesophageal reflux disease)    Past Surgical History  Procedure Laterality Date  . Two knee surgeries Left 1995   Family History  Problem Relation Age of Onset  . Hypertension Mother   . Hypertension Father   . Hypertension Brother   . Hyperlipidemia Paternal Grandfather   . Diabetes Maternal Grandmother   . Heart failure Maternal Grandfather   . Heart failure Paternal Grandfather    History  Substance Use Topics  . Smoking status: Current Every Day Smoker -- 0.50 packs/day for 13 years    Types: Cigarettes  . Smokeless tobacco: Not on file  . Alcohol Use: Yes     Comment: occassional    Review of Systems  All other systems reviewed and are negative.    Allergies  Review of patient's allergies indicates no known allergies.  Home Medications   Current Outpatient Rx  Name  Route  Sig  Dispense  Refill  . amLODipine (NORVASC) 10 MG tablet   Oral   Take 1 tablet (10 mg total) by mouth daily.   30 tablet   2   . lisinopril (PRINIVIL,ZESTRIL) 10 MG tablet   Oral   Take 1 tablet (10 mg total) by mouth daily.   30 tablet   2   . albuterol (PROVENTIL HFA;VENTOLIN HFA) 108 (90 BASE) MCG/ACT inhaler  Inhalation   Inhale 2 puffs into the lungs every 4 (four) hours as needed for wheezing or shortness of breath (or cough).   1 Inhaler   0    BP 149/90  Pulse 80  Temp(Src) 97.4 F (36.3 C) (Oral)  Resp 20  SpO2 91% Physical Exam  Nursing note and vitals reviewed. Constitutional: He is oriented to person, place, and time. He appears well-developed and well-nourished. No distress.  HENT:  Head: Normocephalic and atraumatic.  Eyes:  Normal appearance  Neck: Normal range of motion.  Cardiovascular: Normal rate and regular rhythm.   Pulmonary/Chest: Effort normal and breath sounds normal. No respiratory distress.  Musculoskeletal: Normal range of motion.  Cervical spine non-tender.  Full, active ROM of neck but pain/paresthesias in LUE reproduced by lateral bending to right.  RUE w/out deformity, edema, skin changes, tenderness.  Full ROM wrist, elbow, shoulder w/out pain.  2+ radial pulse and distal sensation intact.      Neurological: He is alert and oriented to person, place, and time.  Skin: Skin is warm and dry. No rash noted.  Psychiatric: He has a normal mood and affect. His behavior is normal.    ED Course  Procedures (including critical care time) Labs Review Labs Reviewed - No data to display Imaging Review No results found.  EKG Interpretation   None  MDM   1. Cervical radiculopathy    32yo M smoker w/ HTN presents w/ non-traumatic LUE pain/paresthesias.  No associated CP/SOB and sx are non-exertional.  EKG non-ischemic.  Based on history and exam, suspect cervical radiculopathy.  Outpatient MRI ordered for tomorrow am.  Patient's PCP notified via e-mail and patient instructed to f/u w/ him.  I will review results as well.  Prescribed vicodin for pain.  Return precautions discussed.   It appears that patient did not return for MRI cervical spine. 11:21 PM   Remer Macho, PA-C 07/14/13 2321

## 2013-07-18 NOTE — ED Provider Notes (Signed)
Medical screening examination/treatment/procedure(s) were performed by non-physician practitioner and as supervising physician I was immediately available for consultation/collaboration.   Date: 07/13/2013  Rate: 60  Rhythm: normal sinus rhythm  QRS Axis: normal  Intervals: normal  ST/T Wave abnormalities: normal  Conduction Disutrbances:none  Narrative Interpretation:   Old EKG Reviewed: unchanged      Ephraim Hamburger, MD 07/18/13 [phone]

## 2013-07-21 ENCOUNTER — Ambulatory Visit (HOSPITAL_COMMUNITY): Payer: BC Managed Care – PPO | Attending: Emergency Medicine

## 2013-07-21 ENCOUNTER — Emergency Department (HOSPITAL_COMMUNITY)
Admission: EM | Admit: 2013-07-21 | Discharge: 2013-07-21 | Disposition: A | Discharge status: Home / Self Care | Payer: BC Managed Care – PPO | Source: Home / Self Care | Attending: Emergency Medicine | Admitting: Emergency Medicine

## 2013-07-21 ENCOUNTER — Encounter (HOSPITAL_COMMUNITY): Payer: Self-pay | Admitting: Emergency Medicine

## 2013-07-21 DIAGNOSIS — R059 Cough, unspecified: Secondary | ICD-10-CM | POA: Insufficient documentation

## 2013-07-21 DIAGNOSIS — J111 Influenza due to unidentified influenza virus with other respiratory manifestations: Secondary | ICD-10-CM

## 2013-07-21 DIAGNOSIS — J039 Acute tonsillitis, unspecified: Secondary | ICD-10-CM

## 2013-07-21 DIAGNOSIS — R05 Cough: Secondary | ICD-10-CM | POA: Insufficient documentation

## 2013-07-21 DIAGNOSIS — R509 Fever, unspecified: Secondary | ICD-10-CM | POA: Insufficient documentation

## 2013-07-21 LAB — POCT RAPID STREP A: Streptococcus, Group A Screen (Direct): NEGATIVE

## 2013-07-21 IMAGING — CR DG CHEST 2V
2 series · 2 of 2 positions shown · non-contrast
Comparison: [DATE].

CLINICAL DATA: Cough, fever.

EXAM:
CHEST  2 VIEW

[w chest pa *]
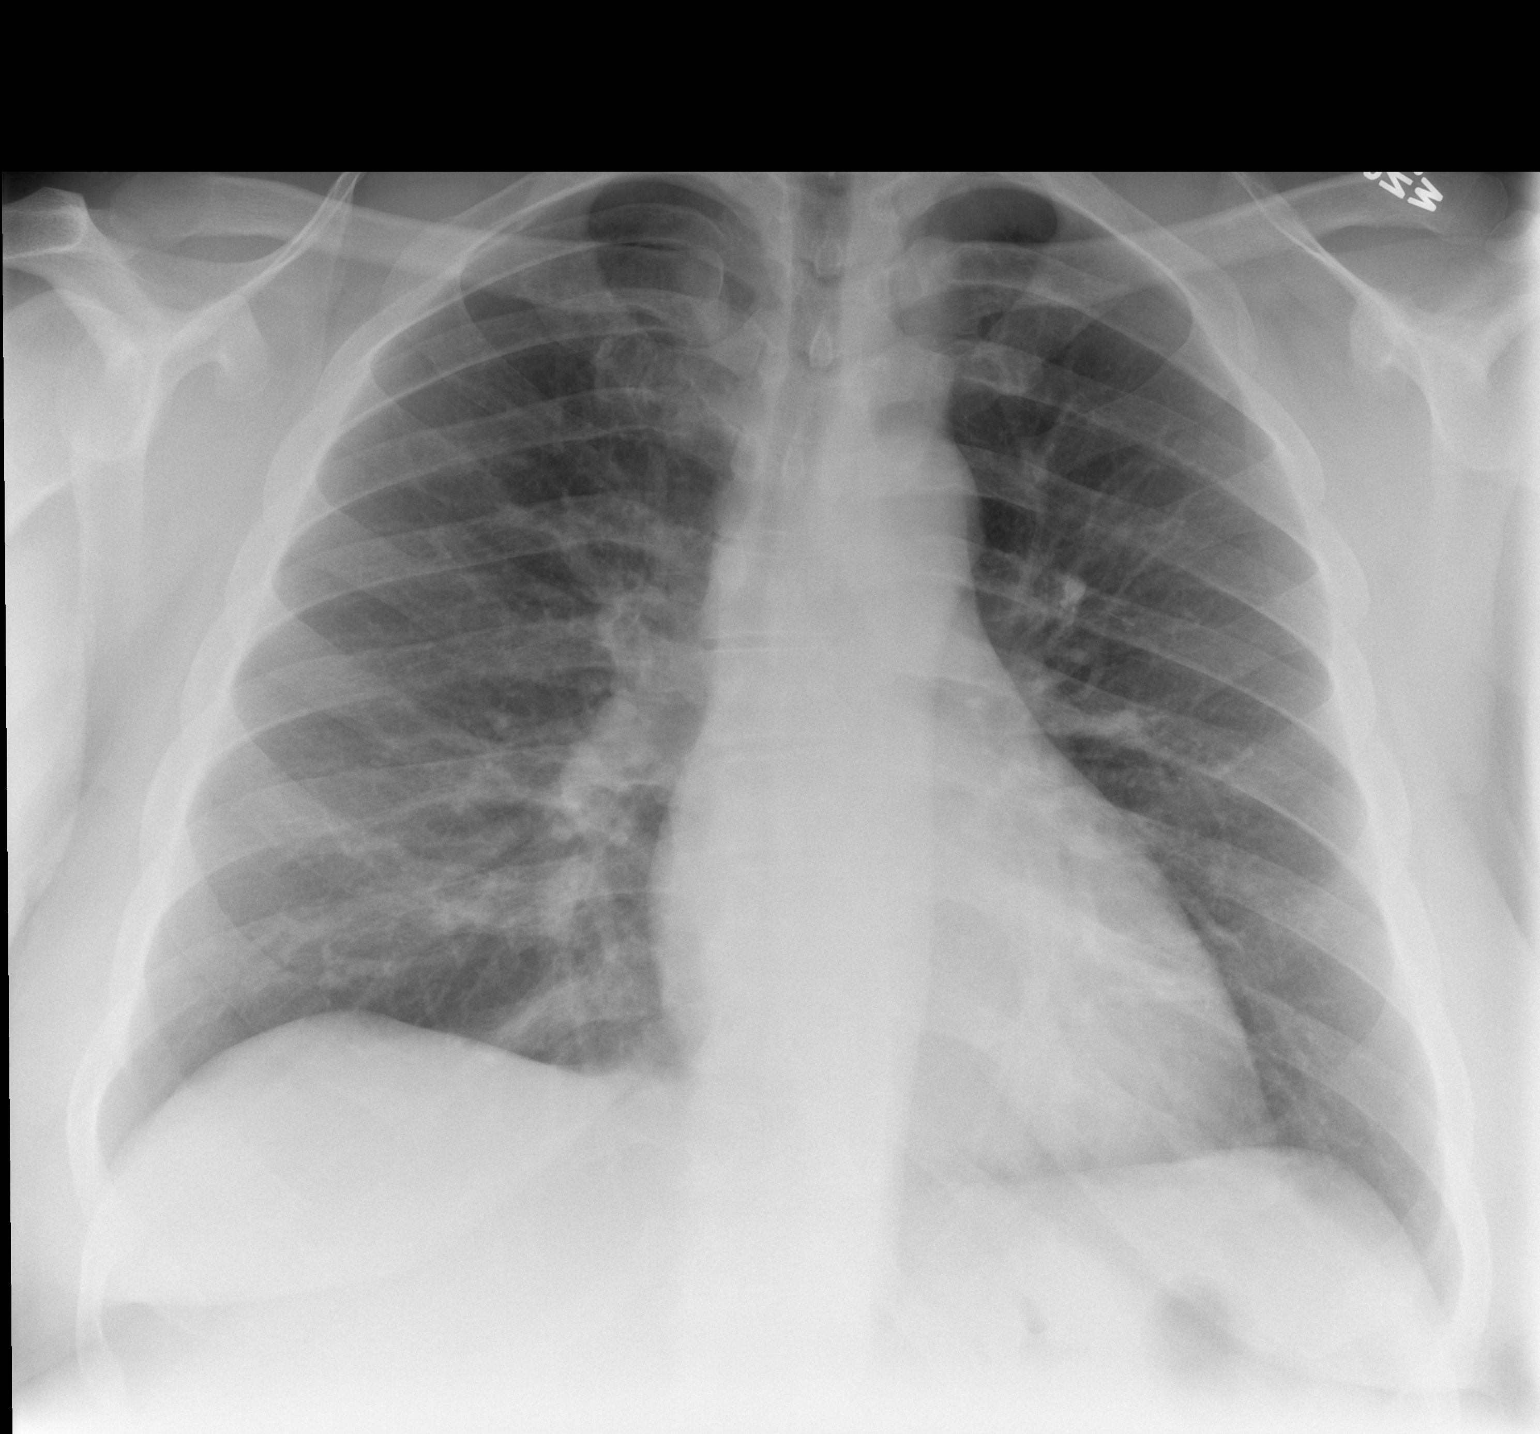

[w chest lat *]
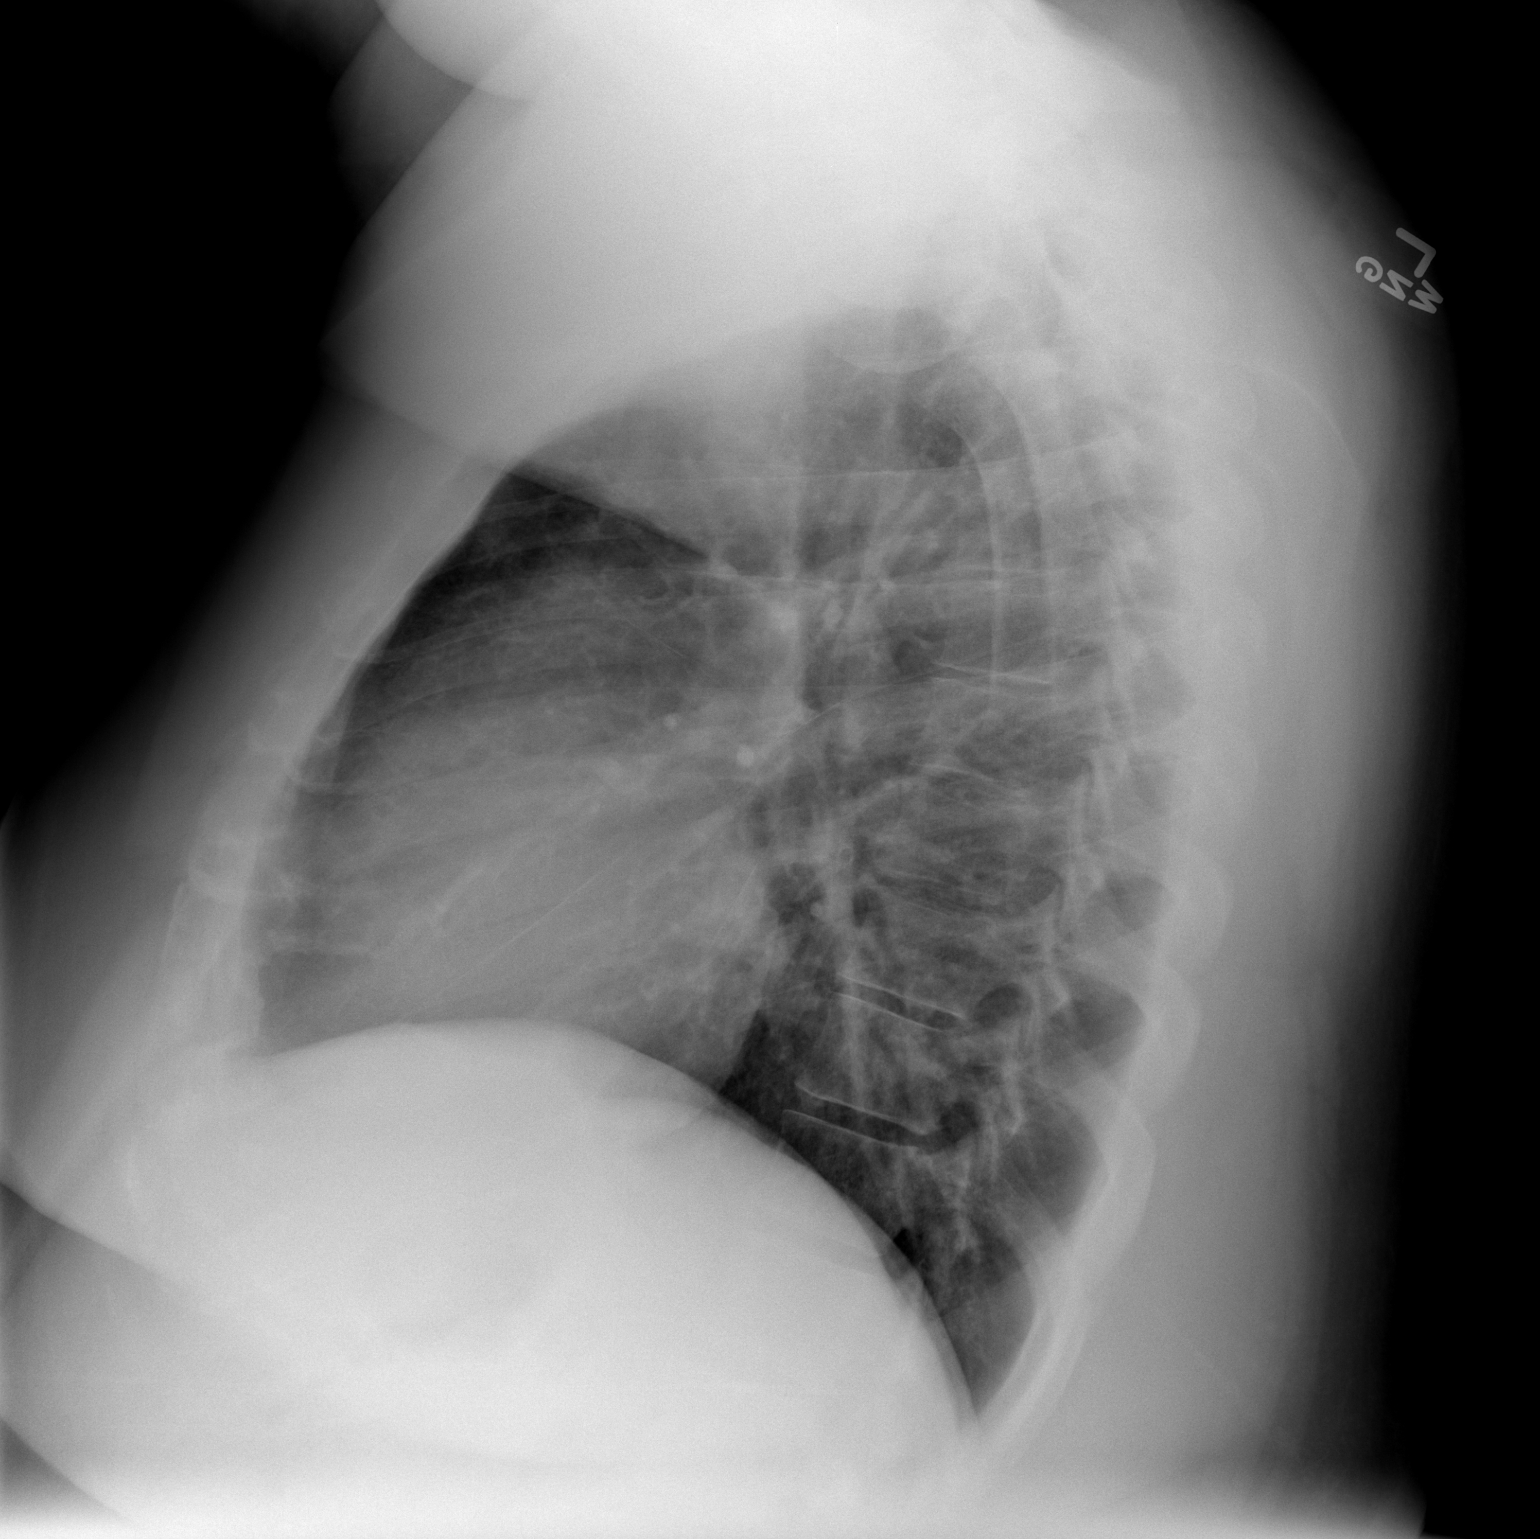

[2 of 2 positions shown; findings below may reference images not displayed]

FINDINGS: The heart size and mediastinal contours are within normal limits.
Both lungs are clear. The visualized skeletal structures are
unremarkable.
IMPRESSION: No active cardiopulmonary disease.

## 2013-07-21 MED ORDER — OSELTAMIVIR PHOSPHATE 75 MG PO CAPS: 75.0000 mg | ORAL_CAPSULE | Freq: Two times a day (BID) | ORAL | Status: DC

## 2013-07-21 MED ORDER — AMOXICILLIN 500 MG PO CAPS: 500.0000 mg | ORAL_CAPSULE | Freq: Three times a day (TID) | ORAL | Status: DC

## 2013-07-21 MED ORDER — HYDROCOD POLST-CHLORPHEN POLST 10-8 MG/5ML PO LQCR: 5.0000 mL | Freq: Two times a day (BID) | ORAL | Status: DC | PRN

## 2013-07-21 MED ORDER — ONDANSETRON 8 MG PO TBDP: 8.0000 mg | ORAL_TABLET | Freq: Three times a day (TID) | ORAL | Status: DC | PRN

## 2013-07-21 NOTE — ED Notes (Signed)
States his daughter has "stomach flu" , and had been sick last week; he has reported n/v, sore throat; NAD

## 2013-07-21 NOTE — ED Provider Notes (Signed)
Chief Complaint:   Chief Complaint  Patient presents with  . Fever    History of Present Illness:   Calvin Bailey is a 32 year old male who has had a three-day history of fever of up to 102, chills, sweats, myalgias, nasal congestion, rhinorrhea with yellow to green drainage, headache, sore throat, cough productive yellow-green sputum, wheezing, nausea and vomiting, and loose stools. The patient vomited twice yesterday and once today. There was no blood in the vomitus, no coffee-ground emesis, and no bilious emesis. He has about one loose stool per day. No blood in the stool or melena. He denies any abdominal pain. He has not had shortness of breath, chest pain, earache, or stiff neck. He has a history of strep throat in the past. I saw him about a year ago for this. His tonsils were described to be enlarged at that time. They're still enlarged today. He has a history of sleep apnea, but is not using a CPAP machine, since he cannot afford it.  Review of Systems:  Other than noted above, the patient denies any of the following symptoms: Systemic:  No fevers, chills, sweats, weight loss or gain, fatigue, or tiredness. Eye:  No redness or discharge. ENT:  No ear pain, drainage, headache, nasal congestion, drainage, sinus pressure, difficulty swallowing, or sore throat. Neck:  No neck pain or swollen glands. Lungs:  No cough, sputum production, hemoptysis, wheezing, chest tightness, shortness of breath or chest pain. GI:  No abdominal pain, nausea, vomiting or diarrhea.  [place]:  Past medical history, family history, social history, meds, and allergies were reviewed. No known allergies, takes amlodipine and lisinopril for high blood pressure. He smokes a half pack of cigarettes a day.  Physical Exam:   Vital signs:  BP 154/99  Pulse 72  Temp(Src) 98 F (36.7 C) (Oral)  Resp 18  SpO2 96% General:  Alert and oriented.  In no distress.  Skin warm and dry. Eye:  No conjunctival injection or  drainage. Lids were normal. ENT:  TMs and canals were normal, without erythema or inflammation.  Nasal mucosa was clear and uncongested, without drainage.  Mucous membranes were moist.  Tonsils were markedly enlarged, so much so that they meet in the middle. He has noisy, rattly respirations. Tonsils were red with spots of white exudate.  There were no oral ulcerations or lesions. Neck:  Supple, no adenopathy, tenderness or mass. Lungs:  No respiratory distress.  Lungs were clear to auscultation, without wheezes, rales or rhonchi.  Breath sounds were clear and equal bilaterally.  Heart:  Regular rhythm, without gallops, murmers or rubs. Abdomen: Soft, nontender, no organomegaly or mass. Bowel sounds are hyperactive. Skin:  Clear, warm, and dry, without rash or lesions.  Labs:   Results for orders placed during the hospital encounter of 07/21/13  POCT RAPID STREP A ([facility] CARE ONLY)      Result Value Range   Streptococcus, Group A Screen (Direct) NEGATIVE  NEGATIVE     Radiology:  Dg Chest 2 View  07/21/2013   CLINICAL DATA:  Cough, fever.  EXAM: CHEST  2 VIEW  COMPARISON:  December 15, 2012.  FINDINGS: The heart size and mediastinal contours are within normal limits. Both lungs are clear. The visualized skeletal structures are unremarkable.  IMPRESSION: No active cardiopulmonary disease.   Electronically Signed   By: Sabino Dick M.D.   On: 07/21/2013 19:50   Assessment:  The primary encounter diagnosis was Tonsillitis. A diagnosis of Influenza-like illness was  also pertinent to this visit.  His tonsils are markedly enlarged, and I think he got to consider tonsillectomy, since I think this will benefit his sleep apnea, along with weight loss. It's hard to say whether his tonsillar enlargement is acute or chronic. I'm going to go ahead and treat with amoxicillin for tonsillitis, along with Tamiflu for influenza-like illness.  Plan:   1.  Meds:  The following meds were prescribed:   Discharge  Medication List as of 07/21/2013  8:02 PM    START taking these medications   Details  amoxicillin (AMOXIL) 500 MG capsule Take 1 capsule (500 mg total) by mouth 3 (three) times daily., Starting 07/21/2013, Until Discontinued, Normal    chlorpheniramine-HYDROcodone (TUSSIONEX) 10-8 MG/5ML LQCR Take 5 mLs by mouth every 12 (twelve) hours as needed., Starting 07/21/2013, Until Discontinued, Normal    ondansetron (ZOFRAN ODT) 8 MG disintegrating tablet Take 1 tablet (8 mg total) by mouth every 8 (eight) hours as needed for nausea., Starting 07/21/2013, Until Discontinued, Normal    oseltamivir (TAMIFLU) 75 MG capsule Take 1 capsule (75 mg total) by mouth every 12 (twelve) hours., Starting 07/21/2013, Until Discontinued, Normal        2.  Patient Education/Counseling:  The patient was given appropriate handouts, self care instructions, and instructed in symptomatic relief.  She rests and get extra fluids. No work until Wednesday.  3.  Follow up:  The patient was told to follow up if no better in 3 to 4 days, if becoming worse in any way, and given some red flag symptoms such as difficulty breathing or increasing fever which would prompt immediate return.  Follow up here as necessary, also given the name of Dr. Melida Quitter to followup with for the enlarged tonsils.      Harden Mo, MD 07/21/13 2136

## 2013-07-23 LAB — CULTURE, GROUP A STREP

## 2013-09-16 ENCOUNTER — Encounter (HOSPITAL_COMMUNITY): Payer: Self-pay | Admitting: Emergency Medicine

## 2013-09-16 ENCOUNTER — Emergency Department (HOSPITAL_COMMUNITY)
Admission: EM | Admit: 2013-09-16 | Discharge: 2013-09-16 | Disposition: A | Discharge status: Home / Self Care | Payer: Self-pay | Source: Home / Self Care | Attending: Family Medicine | Admitting: Family Medicine

## 2013-09-16 DIAGNOSIS — Z760 Encounter for issue of repeat prescription: Secondary | ICD-10-CM

## 2013-09-16 DIAGNOSIS — I10 Essential (primary) hypertension: Secondary | ICD-10-CM

## 2013-09-16 DIAGNOSIS — E669 Obesity, unspecified: Secondary | ICD-10-CM

## 2013-09-16 MED ORDER — LISINOPRIL 10 MG PO TABS: 10.0000 mg | ORAL_TABLET | Freq: Every day | ORAL | Status: DC

## 2013-09-16 MED ORDER — AMLODIPINE BESYLATE 10 MG PO TABS: 10.0000 mg | ORAL_TABLET | Freq: Every day | ORAL | Status: DC

## 2013-09-16 NOTE — ED Provider Notes (Signed)
CSN: [id number]     Arrival date & time 09/16/13  1232 History   First MD Initiated Contact with Patient 09/16/13 1345     Chief Complaint  Patient presents with  . Hypertension   (Consider location/radiation/quality/duration/timing/severity/associated sxs/prior Treatment) HPI Comments: Patient here to request medication refill of his lisinopril and amlodipine. Rx has lapsed and cannot see PCP until Feb. 2015. No additional complaints. PCP: [facility].  The history is provided by the patient.    Past Medical History  Diagnosis Date  . HTN (hypertension)   . Sleep apnea   . GERD (gastroesophageal reflux disease)    Past Surgical History  Procedure Laterality Date  . Two knee surgeries Left 1995   Family History  Problem Relation Age of Onset  . Hypertension Mother   . Hypertension Father   . Hypertension Brother   . Hyperlipidemia Paternal Grandfather   . Diabetes Maternal Grandmother   . Heart failure Maternal Grandfather   . Heart failure Paternal Grandfather    History  Substance Use Topics  . Smoking status: Current Every Day Smoker -- 0.50 packs/day for 13 years    Types: Cigarettes  . Smokeless tobacco: Not on file  . Alcohol Use: Yes     Comment: occassional    Review of Systems  All other systems reviewed and are negative.    Allergies  Review of patient's allergies indicates no known allergies.  Home Medications   Current Outpatient Rx  Name  Route  Sig  Dispense  Refill  . albuterol (PROVENTIL HFA;VENTOLIN HFA) 108 (90 BASE) MCG/ACT inhaler   Inhalation   Inhale 2 puffs into the lungs every 4 (four) hours as needed for wheezing or shortness of breath (or cough).   1 Inhaler   0   . amLODipine (NORVASC) 10 MG tablet   Oral   Take 1 tablet (10 mg total) by mouth daily.   30 tablet   2   . amoxicillin (AMOXIL) 500 MG capsule   Oral   Take 1 capsule (500 mg total) by mouth 3 (three) times daily.   30 capsule   0     Dispense as written.   .  chlorpheniramine-HYDROcodone (TUSSIONEX) 10-8 MG/5ML LQCR   Oral   Take 5 mLs by mouth every 12 (twelve) hours as needed.   140 mL   0   . HYDROcodone-acetaminophen (NORCO/VICODIN) 5-325 MG per tablet   Oral   Take 1 tablet by mouth every 4 (four) hours as needed for pain.   12 tablet   0   . lisinopril (PRINIVIL,ZESTRIL) 10 MG tablet   Oral   Take 1 tablet (10 mg total) by mouth daily.   30 tablet   2   . ondansetron (ZOFRAN ODT) 8 MG disintegrating tablet   Oral   Take 1 tablet (8 mg total) by mouth every 8 (eight) hours as needed for nausea.   20 tablet   0   . oseltamivir (TAMIFLU) 75 MG capsule   Oral   Take 1 capsule (75 mg total) by mouth every 12 (twelve) hours.   10 capsule   0    BP 139/72  Pulse 83  Temp(Src) 98.7 F (37.1 C) (Oral)  Resp 18  SpO2 96% Physical Exam  Nursing note and vitals reviewed. Constitutional: He is oriented to person, place, and time. He appears well-developed and well-nourished. No distress.  +morbidly obese  HENT:  Head: Normocephalic and atraumatic.  Eyes: No scleral icterus.  Cardiovascular: Normal  rate, regular rhythm and normal heart sounds.   Pulmonary/Chest: Effort normal and breath sounds normal.  Abdominal: Soft. Bowel sounds are normal. There is no tenderness.  Musculoskeletal: Normal range of motion.  Neurological: He is alert and oriented to person, place, and time.  Skin: Skin is warm and dry.  Psychiatric: He has a normal mood and affect. His behavior is normal.    ED Course  Procedures (including critical care time) Labs Review Labs Reviewed - No data to display Imaging Review No results found.  EKG Interpretation    Date/Time:    Ventricular Rate:    PR Interval:    QRS Duration:   QT Interval:    QTC Calculation:   R Axis:     Text Interpretation:              MDM  Advised patient that I would be glad to provide Rx for above lisinopril and amlodipine until he can return to seeing his  PCP in Feb. 2015.    [place], [place] 09/16/13 [phone]

## 2013-09-16 NOTE — ED Notes (Signed)
C/o medication refill States he has been out of meds since Thursday Starts new job and new insurance starts in Feb

## 2013-09-17 NOTE — ED Provider Notes (Signed)
Medical screening examination/treatment/procedure(s) were performed by a resident physician or non-physician practitioner and as the supervising physician I was immediately available for consultation/collaboration.  Lynne Leader, MD    Gregor Hams, MD 09/17/13 [phone]

## 2014-01-27 ENCOUNTER — Encounter (HOSPITAL_COMMUNITY): Payer: Self-pay | Admitting: Emergency Medicine

## 2014-01-27 ENCOUNTER — Emergency Department (INDEPENDENT_AMBULATORY_CARE_PROVIDER_SITE_OTHER)
Admission: EM | Admit: 2014-01-27 | Discharge: 2014-01-27 | Disposition: A | Discharge status: Home / Self Care | Payer: 59 | Source: Home / Self Care | Attending: Family Medicine | Admitting: Family Medicine

## 2014-01-27 DIAGNOSIS — I10 Essential (primary) hypertension: Secondary | ICD-10-CM

## 2014-01-27 MED ORDER — LISINOPRIL 20 MG PO TABS: 20.0000 mg | ORAL_TABLET | Freq: Every day | ORAL | Status: DC

## 2014-01-27 MED ORDER — AMLODIPINE BESYLATE 10 MG PO TABS: 10.0000 mg | ORAL_TABLET | Freq: Every day | ORAL | Status: DC

## 2014-01-27 NOTE — ED Notes (Signed)
Pt  Has  A  History  Of  Hypertension      He has  Ran out  Of his  Blood  Pressure        Medication       He  States  He  Has        No  PCP

## 2014-01-27 NOTE — ED Notes (Signed)
Resources    For        Primary   Care  Providers  Given to   Pt  Upon  Discharge

## 2014-01-27 NOTE — ED Provider Notes (Signed)
Medical screening examination/treatment/procedure(s) were performed by a resident physician or non-physician practitioner and as the supervising physician I was immediately available for consultation/collaboration.  Lynne Leader, MD    Gregor Hams, MD 01/27/14 [phone]

## 2014-01-27 NOTE — Discharge Instructions (Signed)
DASH Diet The DASH diet stands for "Dietary Approaches to Stop Hypertension." It is a healthy eating plan that has been shown to reduce high blood pressure (hypertension) in as little as 14 days, while also possibly providing other significant health benefits. These other health benefits include reducing the risk of breast cancer after menopause and reducing the risk of type 2 diabetes, heart disease, colon cancer, and stroke. Health benefits also include weight loss and slowing kidney failure in patients with chronic kidney disease.  DIET GUIDELINES  Limit salt (sodium). Your diet should contain less than 1500 mg of sodium daily.  Limit refined or processed carbohydrates. Your diet should include mostly whole grains. Desserts and added sugars should be used sparingly.  Include small amounts of heart-healthy fats. These types of fats include nuts, oils, and tub margarine. Limit saturated and trans fats. These fats have been shown to be harmful in the body. CHOOSING FOODS  The following food groups are based on a 2000 calorie diet. See your Registered Dietitian for individual calorie needs. Grains and Grain Products (6 to 8 servings daily)  Eat More Often: Whole-wheat bread, brown rice, whole-grain or wheat pasta, quinoa, popcorn without added fat or salt (air popped).  Eat Less Often: White bread, white pasta, white rice, cornbread. Vegetables (4 to 5 servings daily)  Eat More Often: Fresh, frozen, and canned vegetables. Vegetables may be raw, steamed, roasted, or grilled with a minimal amount of fat.  Eat Less Often/Avoid: Creamed or fried vegetables. Vegetables in a cheese sauce. Fruit (4 to 5 servings daily)  Eat More Often: All fresh, canned (in natural juice), or frozen fruits. Dried fruits without added sugar. One hundred percent fruit juice ( cup [237 mL] daily).  Eat Less Often: Dried fruits with added sugar. Canned fruit in light or heavy syrup. [employer], Fish, and Poultry (2  servings or less daily. One serving is 3 to 4 oz [85-114 g]).  Eat More Often: Ninety percent or leaner ground beef, tenderloin, sirloin. Round cuts of beef, chicken breast, [place] breast. All fish. Grill, bake, or broil your meat. Nothing should be fried.  Eat Less Often/Avoid: Fatty cuts of meat, [place], or chicken leg, thigh, or wing. Fried cuts of meat or fish. Dairy (2 to 3 servings)  Eat More Often: Low-fat or fat-free milk, low-fat plain or light yogurt, reduced-fat or part-skim cheese.  Eat Less Often/Avoid: Milk (whole, 2%).Whole milk yogurt. Full-fat cheeses. Nuts, Seeds, and Legumes (4 to 5 servings per week)  Eat More Often: All without added salt.  Eat Less Often/Avoid: Salted nuts and seeds, canned beans with added salt. Fats and Sweets (limited)  Eat More Often: Vegetable oils, tub margarines without trans fats, sugar-free gelatin. Mayonnaise and salad dressings.  Eat Less Often/Avoid: Coconut oils, palm oils, butter, stick margarine, cream, half and half, cookies, candy, pie. FOR MORE INFORMATION The Dash Diet Eating Plan: www.dashdiet.org Document Released: 09/02/2011 Document Revised: 12/06/2011 Document Reviewed: 09/02/2011 [facility] Patient Information 2014 [place], [place].  Hypertension As your heart beats, it forces blood through your arteries. This force is your blood pressure. If the pressure is too high, it is called hypertension (HTN) or high blood pressure. HTN is dangerous because you may have it and not know it. High blood pressure may mean that your heart has to work harder to pump blood. Your arteries may be narrow or stiff. The extra work puts you at risk for heart disease, stroke, and other problems.  Blood pressure consists of two numbers,  a higher number over a lower, 110/72, for example. It is stated as "110 over 72." The ideal is below 120 for the top number (systolic) and under 80 for the bottom (diastolic). Write down your blood pressure today. You  should pay close attention to your blood pressure if you have certain conditions such as:  Heart failure.  Prior heart attack.  Diabetes  Chronic kidney disease.  Prior stroke.  Multiple risk factors for heart disease. To see if you have HTN, your blood pressure should be measured while you are seated with your arm held at the level of the heart. It should be measured at least twice. A one-time elevated blood pressure reading (especially in the Emergency Department) does not mean that you need treatment. There may be conditions in which the blood pressure is different between your right and left arms. It is important to see your caregiver soon for a recheck. Most people have essential hypertension which means that there is not a specific cause. This type of high blood pressure may be lowered by changing lifestyle factors such as:  Stress.  Smoking.  Lack of exercise.  Excessive weight.  Drug/tobacco/alcohol use.  Eating less salt. Most people do not have symptoms from high blood pressure until it has caused damage to the body. Effective treatment can often prevent, delay or reduce that damage. TREATMENT  When a cause has been identified, treatment for high blood pressure is directed at the cause. There are a large number of medications to treat HTN. These fall into several categories, and your caregiver will help you select the medicines that are best for you. Medications may have side effects. You should review side effects with your caregiver. If your blood pressure stays high after you have made lifestyle changes or started on medicines,   Your medication(s) may need to be changed.  Other problems may need to be addressed.  Be certain you understand your prescriptions, and know how and when to take your medicine.  Be sure to follow up with your caregiver within the time frame advised (usually within two weeks) to have your blood pressure rechecked and to review your  medications.  If you are taking more than one medicine to lower your blood pressure, make sure you know how and at what times they should be taken. Taking two medicines at the same time can result in blood pressure that is too low. SEEK IMMEDIATE MEDICAL CARE IF:  You develop a severe headache, blurred or changing vision, or confusion.  You have unusual weakness or numbness, or a faint feeling.  You have severe chest or abdominal pain, vomiting, or breathing problems. MAKE SURE YOU:   Understand these instructions.  Will watch your condition.  Will get help right away if you are not doing well or get worse. Document Released: 09/13/2005 Document Revised: 12/06/2011 Document Reviewed: 05/03/2008 [facility] Patient Information 2014 [place].  Obesity Obesity is defined as having too much total body fat and a body mass index (BMI) of 30 or more. BMI is an estimate of body fat and is calculated from your height and weight. Obesity happens when you consume more calories than you can burn by exercising or performing daily physical tasks. Prolonged obesity can cause major illnesses or emergencies, such as:   A stroke.  Heart disease.  Diabetes.  Cancer.  Arthritis.  High blood pressure (hypertension).  High cholesterol.  Sleep apnea.  Erectile dysfunction.  Infertility problems. CAUSES   Regularly eating unhealthy foods.  Physical inactivity.  Certain disorders, such as an underactive thyroid (hypothyroidism), Cushing's syndrome, and polycystic ovarian syndrome.  Certain medicines, such as steroids, some depression medicines, and antipsychotics.  Genetics.  Lack of sleep. DIAGNOSIS  A caregiver can diagnose obesity after calculating your BMI. Obesity will be diagnosed if your BMI is 30 or higher.  There are other methods of measuring obesity levels. Some other methods include measuring your skin fold thickness, your waist circumference, and comparing your hip  circumference to your waist circumference. TREATMENT  A healthy treatment program includes some or all of the following:  Long-term dietary changes.  Exercise and physical activity.  Behavioral and lifestyle changes.  Medicine only under the supervision of your caregiver. Medicines may help, but only if they are used with diet and exercise programs. An unhealthy treatment program includes:  Fasting.  Fad diets.  Supplements and drugs. These choices do not succeed in long-term weight control.  HOME CARE INSTRUCTIONS   Exercise and perform physical activity as directed by your caregiver. To increase physical activity, try the following:  Use stairs instead of elevators.  Park farther away from store entrances.  Garden, bike, or walk instead of watching television or using the computer.  Eat healthy, low-calorie foods and drinks on a regular basis. Eat more fruits and vegetables. Use low-calorie cookbooks or take healthy cooking classes.  Limit fast food, sweets, and processed snack foods.  Eat smaller portions.  Keep a daily journal of everything you eat. There are many free websites to help you with this. It may be helpful to measure your foods so you can determine if you are eating the correct portion sizes.  Avoid drinking alcohol. Drink more water and drinks without calories.  Take vitamins and supplements only as recommended by your caregiver.  Weight-loss support groups, Nurse, mental health, counselors, and stress reduction education can also be very helpful. SEEK IMMEDIATE MEDICAL CARE IF:  You have chest pain or tightness.  You have trouble breathing or feel short of breath.  You have weakness or leg numbness.  You feel confused or have trouble talking.  You have sudden changes in your vision. MAKE SURE YOU:  Understand these instructions.  Will watch your condition.  Will get help right away if you are not doing well or get worse. Document  Released: 10/21/2004 Document Revised: 03/14/2012 Document Reviewed: 10/20/2011 [facility] Patient Information 2014 [place].

## 2014-01-27 NOTE — ED Provider Notes (Signed)
CSN: [id number]     Arrival date & time 01/27/14  1436 History   First MD Initiated Contact with Patient 01/27/14 1521     Chief Complaint  Patient presents with  . Medication Refill   (Consider location/radiation/quality/duration/timing/severity/associated sxs/prior Treatment) HPI Comments: Patient presents requesting refills for his blood pressure medications.   The history is provided by the patient.    Past Medical History  Diagnosis Date  . HTN (hypertension)   . Sleep apnea   . GERD (gastroesophageal reflux disease)    Past Surgical History  Procedure Laterality Date  . Two knee surgeries Left 1995   Family History  Problem Relation Age of Onset  . Hypertension Mother   . Hypertension Father   . Hypertension Brother   . Hyperlipidemia Paternal Grandfather   . Diabetes Maternal Grandmother   . Heart failure Maternal Grandfather   . Heart failure Paternal Grandfather    History  Substance Use Topics  . Smoking status: Current Every Day Smoker -- 0.50 packs/day for 13 years    Types: Cigarettes  . Smokeless tobacco: Not on file  . Alcohol Use: Yes     Comment: occassional    Review of Systems  All other systems reviewed and are negative.   Allergies  Review of patient's allergies indicates no known allergies.  Home Medications   Prior to Admission medications   Medication Sig Start Date End Date Taking? Authorizing Provider  albuterol (PROVENTIL HFA;VENTOLIN HFA) 108 (90 BASE) MCG/ACT inhaler Inhale 2 puffs into the lungs every 4 (four) hours as needed for wheezing or shortness of breath (or cough). 12/15/12   Guimaraes Bibles, PA-C  amLODipine (NORVASC) 10 MG tablet Take 1 tablet (10 mg total) by mouth daily. 05/21/13   Carvel Getting, NP  amLODipine (NORVASC) 10 MG tablet Take 1 tablet (10 mg total) by mouth daily. 09/16/13   Lahoma Rocker, PA  amoxicillin (AMOXIL) 500 MG capsule Take 1 capsule (500 mg total) by mouth 3 (three) times daily. 07/21/13   Harden Mo, MD  chlorpheniramine-HYDROcodone (TUSSIONEX) 10-8 MG/5ML LQCR Take 5 mLs by mouth every 12 (twelve) hours as needed. 07/21/13   Harden Mo, MD  HYDROcodone-acetaminophen (NORCO/VICODIN) 5-325 MG per tablet Take 1 tablet by mouth every 4 (four) hours as needed for pain. 07/14/13   Arville Lime Schinlever, PA-C  lisinopril (PRINIVIL,ZESTRIL) 10 MG tablet Take 1 tablet (10 mg total) by mouth daily. 05/21/13   Carvel Getting, NP  lisinopril (PRINIVIL,ZESTRIL) 10 MG tablet Take 1 tablet (10 mg total) by mouth daily. 09/16/13   Lahoma Rocker, PA  ondansetron (ZOFRAN ODT) 8 MG disintegrating tablet Take 1 tablet (8 mg total) by mouth every 8 (eight) hours as needed for nausea. 07/21/13   Harden Mo, MD  oseltamivir (TAMIFLU) 75 MG capsule Take 1 capsule (75 mg total) by mouth every 12 (twelve) hours. 07/21/13   Harden Mo, MD   BP 166/95  Pulse 81  Temp(Src) 98.2 F (36.8 C) (Oral)  Resp 25  SpO2 96% Physical Exam  Nursing note and vitals reviewed. Constitutional: He is oriented to person, place, and time. He appears well-developed and well-nourished. No distress.  +morbid obesity  HENT:  Head: Normocephalic and atraumatic.  Eyes: No scleral icterus.  Cardiovascular: Normal rate.   Pulmonary/Chest: Effort normal.  Musculoskeletal: Normal range of motion.  Neurological: He is alert and oriented to person, place, and time.  Skin: Skin is warm and dry.  Psychiatric: He  has a normal mood and affect. His behavior is normal.    ED Course  Procedures (including critical care time) Labs Review Labs Reviewed - No data to display  Imaging Review No results found.   MDM   1. Morbid obesity   2. Hypertension    Mr. Maus continues to attempt to have our clinic manage his HTN. I explained to him that while some urgent care facilities do have primary care or family practice divisions within their facility, I explained that our UC does not.  I have seen him in the  past along with at least two other providers here at [facility] over the last 6-12 months. Each time he is provided with medication refills lasting from 1-3 months and instructed to find a PCP before he requires additional refills. I have personally provided him with lists of fee for service providers in the [place] area and I have provided him with another list of six such clinics today. He has yet to make any effort to locate a primary care provider. I asked him if the health insurance that he reported would start in Feb 2015 at his last visit had in fact started and he reports he has since quit that job. I explained clearly to him that we could no longer continue to manage his prescriptions and that he would benefit greatly from seeking a PCP as he is morbidly obese and is likely to have additional undiagnosed health issues. Lastly, advised patient that on M-F weekdays, our clinic has a patient access coordinator that he is welcome to call and speak with.   Lahoma Rocker, PA 01/27/14 1610

## 2014-03-26 ENCOUNTER — Ambulatory Visit (INDEPENDENT_AMBULATORY_CARE_PROVIDER_SITE_OTHER): Payer: 59 | Admitting: Family Medicine

## 2014-03-26 VITALS — BP 146/78 | HR 79 | Temp 98.2°F | Resp 18 | Ht 73.0 in | Wt >= 6400 oz

## 2014-03-26 DIAGNOSIS — G4730 Sleep apnea, unspecified: Secondary | ICD-10-CM

## 2014-03-26 DIAGNOSIS — I10 Essential (primary) hypertension: Secondary | ICD-10-CM

## 2014-03-26 DIAGNOSIS — Z79899 Other long term (current) drug therapy: Secondary | ICD-10-CM

## 2014-03-26 DIAGNOSIS — F17200 Nicotine dependence, unspecified, uncomplicated: Secondary | ICD-10-CM

## 2014-03-26 LAB — COMPREHENSIVE METABOLIC PANEL
ALBUMIN: 4.3 g/dL (ref 3.5–5.2)
ALT: 27 U/L (ref 0–53)
AST: 18 U/L (ref 0–37)
Alkaline Phosphatase: 100 U/L (ref 39–117)
BUN: 13 mg/dL (ref 6–23)
CALCIUM: 9.3 mg/dL (ref 8.4–10.5)
CHLORIDE: 99 meq/L (ref 96–112)
CO2: 29 meq/L (ref 19–32)
Creat: 0.88 mg/dL (ref 0.50–1.35)
GLUCOSE: 109 mg/dL — AB (ref 70–99)
POTASSIUM: 4 meq/L (ref 3.5–5.3)
SODIUM: 138 meq/L (ref 135–145)
TOTAL PROTEIN: 7.2 g/dL (ref 6.0–8.3)
Total Bilirubin: 0.4 mg/dL (ref 0.2–1.2)

## 2014-03-26 LAB — POCT GLYCOSYLATED HEMOGLOBIN (HGB A1C): Hemoglobin A1C: 5.3

## 2014-03-26 MED ORDER — CHLORTHALIDONE 25 MG PO TABS: 25.0000 mg | ORAL_TABLET | Freq: Every day | ORAL | Status: DC

## 2014-03-26 MED ORDER — VARENICLINE TARTRATE 0.5 MG X 11 & 1 MG X 42 PO MISC: ORAL | Status: DC

## 2014-03-26 MED ORDER — LISINOPRIL 40 MG PO TABS: 40.0000 mg | ORAL_TABLET | Freq: Every day | ORAL | Status: DC

## 2014-03-26 NOTE — Patient Instructions (Signed)
Smoking Cessation Quitting smoking is important to your health and has many advantages. However, it is not always easy to quit since nicotine is a very addictive drug. Often times, people try 3 times or more before being able to quit. This document explains the best ways for you to prepare to quit smoking. Quitting takes hard work and a lot of effort, but you can do it. ADVANTAGES OF QUITTING SMOKING  You will live longer, feel better, and live better.  Your body will feel the impact of quitting smoking almost immediately.  Within 20 minutes, blood pressure decreases. Your pulse returns to its normal level.  After 8 hours, carbon monoxide levels in the blood return to normal. Your oxygen level increases.  After 24 hours, the chance of having a heart attack starts to decrease. Your breath, hair, and body stop smelling like smoke.  After 48 hours, damaged nerve endings begin to recover. Your sense of taste and smell improve.  After 72 hours, the body is virtually free of nicotine. Your bronchial tubes relax and breathing becomes easier.  After 2 to 12 weeks, lungs can hold more air. Exercise becomes easier and circulation improves.  The risk of having a heart attack, stroke, cancer, or lung disease is greatly reduced.  After 1 year, the risk of coronary heart disease is cut in half.  After 5 years, the risk of stroke falls to the same as a nonsmoker.  After 10 years, the risk of lung cancer is cut in half and the risk of other cancers decreases significantly.  After 15 years, the risk of coronary heart disease drops, usually to the level of a nonsmoker.  If you are pregnant, quitting smoking will improve your chances of having a healthy baby.  The people you live with, especially any children, will be healthier.  You will have extra money to spend on things other than cigarettes. QUESTIONS TO THINK ABOUT BEFORE ATTEMPTING TO QUIT You may want to talk about your answers with your  caregiver.  Why do you want to quit?  If you tried to quit in the past, what helped and what did not?  What will be the most difficult situations for you after you quit? How will you plan to handle them?  Who can help you through the tough times? Your family? Friends? A caregiver?  What pleasures do you get from smoking? What ways can you still get pleasure if you quit? Here are some questions to ask your caregiver:  How can you help me to be successful at quitting?  What medicine do you think would be best for me and how should I take it?  What should I do if I need more help?  What is smoking withdrawal like? How can I get information on withdrawal? GET READY  Set a quit date.  Change your environment by getting rid of all cigarettes, ashtrays, matches, and lighters in your home, car, or work. Do not let people smoke in your home.  Review your past attempts to quit. Think about what worked and what did not. GET SUPPORT AND ENCOURAGEMENT You have a better chance of being successful if you have help. You can get support in many ways.  Tell your family, friends, and co-workers that you are going to quit and need their support. Ask them not to smoke around you.  Get individual, group, or telephone counseling and support. Programs are available at local hospitals and health centers. Call your local health department for   information about programs in your area.  Spiritual beliefs and practices may help some smokers quit.  Download a "quit meter" on your computer to keep track of quit statistics, such as how long you have gone without smoking, cigarettes not smoked, and money saved.  Get a self-help book about quitting smoking and staying off of tobacco. [place] yourself from urges to smoke. Talk to someone, go for a walk, or occupy your time with a task.  Change your normal routine. Take a different route to work. Drink tea instead of coffee.  Eat breakfast in a different place.  Reduce your stress. Take a hot bath, exercise, or read a book.  Plan something enjoyable to do every day. Reward yourself for not smoking.  Explore interactive web-based programs that specialize in helping you quit. GET MEDICINE AND USE IT CORRECTLY Medicines can help you stop smoking and decrease the urge to smoke. Combining medicine with the above behavioral methods and support can greatly increase your chances of successfully quitting smoking.  Nicotine replacement therapy helps deliver nicotine to your body without the negative effects and risks of smoking. Nicotine replacement therapy includes nicotine gum, lozenges, inhalers, nasal sprays, and skin patches. Some may be available over-the-counter and others require a prescription.  Antidepressant medicine helps people abstain from smoking, but how this works is unknown. This medicine is available by prescription.  Nicotinic receptor partial agonist medicine simulates the effect of nicotine in your brain. This medicine is available by prescription. Ask your caregiver for advice about which medicines to use and how to use them based on your health history. Your caregiver will tell you what side effects to look out for if you choose to be on a medicine or therapy. Carefully read the information on the package. Do not use any other product containing nicotine while using a nicotine replacement product.  RELAPSE OR DIFFICULT SITUATIONS Most relapses occur within the first 3 months after quitting. Do not be discouraged if you start smoking again. Remember, most people try several times before finally quitting. You may have symptoms of withdrawal because your body is used to nicotine. You may crave cigarettes, be irritable, feel very hungry, cough often, get headaches, or have difficulty concentrating. The withdrawal symptoms are only temporary. They are strongest when you first quit, but they will go away within  10-14 days. To reduce the chances of relapse, try to:  Avoid drinking alcohol. Drinking lowers your chances of successfully quitting.  Reduce the amount of caffeine you consume. Once you quit smoking, the amount of caffeine in your body increases and can give you symptoms, such as a rapid heartbeat, sweating, and anxiety.  Avoid smokers because they can make you want to smoke.  Do not let weight gain distract you. Many smokers will gain weight when they quit, usually less than 10 pounds. Eat a healthy diet and stay active. You can always lose the weight gained after you quit.  Find ways to improve your mood other than smoking. FOR MORE INFORMATION  www.smokefree.gov  Document Released: 09/07/2001 Document Revised: 03/14/2012 Document Reviewed: 12/23/2011 [facility] Patient Information 2015 [place], [place]. This information is not intended to replace advice given to you by your health care provider. Make sure you discuss any questions you have with your health care provider. Smoking Cessation, Tips for Success If you are ready to quit smoking, congratulations! You have chosen to help yourself be healthier. Cigarettes bring nicotine, tar, carbon monoxide, and other irritants  into your body. Your lungs, heart, and blood vessels will be able to work better without these poisons. There are many different ways to quit smoking. Nicotine gum, nicotine patches, a nicotine inhaler, or nicotine nasal spray can help with physical craving. Hypnosis, support groups, and medicines help break the habit of smoking. WHAT THINGS CAN I DO TO MAKE QUITTING EASIER?  Here are some tips to help you quit for good:  Pick a date when you will quit smoking completely. Tell all of your friends and family about your plan to quit on that date.  Do not try to slowly cut down on the number of cigarettes you are smoking. Pick a quit date and quit smoking completely starting on that day.  Throw away all cigarettes.   Clean  and remove all ashtrays from your home, work, and car.   On a card, write down your reasons for quitting. Carry the card with you and read it when you get the urge to smoke.   Cleanse your body of nicotine. Drink enough water and fluids to keep your urine clear or pale yellow. Do this after quitting to flush the nicotine from your body.   Learn to predict your moods. Do not let a bad situation be your excuse to have a cigarette. Some situations in your life might tempt you into wanting a cigarette.   Never have "just one" cigarette. It leads to wanting another and another. Remind yourself of your decision to quit.   Change habits associated with smoking. If you smoked while driving or when feeling stressed, try other activities to replace smoking. Stand up when drinking your coffee. Brush your teeth after eating. Sit in a different chair when you read the paper. Avoid alcohol while trying to quit, and try to drink fewer caffeinated beverages. Alcohol and caffeine may urge you to smoke.   Avoid foods and drinks that can trigger a desire to smoke, such as sugary or spicy foods and alcohol.   Ask people who smoke not to smoke around you.   Have something planned to do right after eating or having a cup of coffee. For example, plan to take a walk or exercise.   Try a relaxation exercise to calm you down and decrease your stress. Remember, you may be tense and nervous for the first 2 weeks after you quit, but this will pass.   Find new activities to keep your hands busy. Play with a pen, coin, or rubber band. Doodle or draw things on paper.   Brush your teeth right after eating. This will help cut down on the craving for the taste of tobacco after meals. You can also try mouthwash.   Use oral substitutes in place of cigarettes. Try using lemon drops, carrots, cinnamon sticks, or chewing gum. Keep them handy so they are available when you have the urge to smoke.   When you have the  urge to smoke, try deep breathing.   Designate your home as a nonsmoking area.   If you are a heavy smoker, ask your health care provider about a prescription for nicotine chewing gum. It can ease your withdrawal from nicotine.   Reward yourself. Set aside the cigarette money you save and buy yourself something nice.   Look for support from others. Join a support group or smoking cessation program. Ask someone at home or at work to help you with your plan to quit smoking.   Always ask yourself, "Do I need this cigarette  or is this just a reflex?" Tell yourself, "Today, I choose not to smoke," or "I do not want to smoke." You are reminding yourself of your decision to quit.  Do not replace cigarette smoking with electronic cigarettes (commonly called e-cigarettes). The safety of e-cigarettes is unknown, and some may contain harmful chemicals.  If you relapse, do not give up! Plan ahead and think about what you will do the next time you get the urge to smoke.  HOW WILL I FEEL WHEN I QUIT SMOKING? You may have symptoms of withdrawal because your body is used to nicotine (the addictive substance in cigarettes). You may crave cigarettes, be irritable, feel very hungry, cough often, get headaches, or have difficulty concentrating. The withdrawal symptoms are only temporary. They are strongest when you first quit but will go away within 10-14 days. When withdrawal symptoms occur, stay in control. Think about your reasons for quitting. Remind yourself that these are signs that your body is healing and getting used to being without cigarettes. Remember that withdrawal symptoms are easier to treat than the major diseases that smoking can cause.  Even after the withdrawal is over, expect periodic urges to smoke. However, these cravings are generally short lived and will go away whether you smoke or not. Do not smoke!  WHAT RESOURCES ARE AVAILABLE TO HELP ME QUIT SMOKING? Your health care provider can  direct you to community resources or hospitals for support, which may include:  Group support.  Education.  Hypnosis.  Therapy. Document Released: 06/11/2004 Document Revised: 07/04/2013 Document Reviewed: 03/01/2013 [facility] Patient Information 2015 [place], [place]. This information is not intended to replace advice given to you by your health care provider. Make sure you discuss any questions you have with your health care provider. Managing Your High Blood Pressure Blood pressure is a measurement of how forceful your blood is pressing against the walls of the arteries. Arteries are muscular tubes within the circulatory system. Blood pressure does not stay the same. Blood pressure rises when you are active, excited, or nervous; and it lowers during sleep and relaxation. If the numbers measuring your blood pressure stay above normal most of the time, you are at risk for health problems. High blood pressure (hypertension) is a long-term (chronic) condition in which blood pressure is elevated. A blood pressure reading is recorded as two numbers, such as 120 over 80 (or 120/80). The first, higher number is called the systolic pressure. It is a measure of the pressure in your arteries as the heart beats. The second, lower number is called the diastolic pressure. It is a measure of the pressure in your arteries as the heart relaxes between beats.  Keeping your blood pressure in a normal range is important to your overall health and prevention of health problems, such as heart disease and stroke. When your blood pressure is uncontrolled, your heart has to work harder than normal. High blood pressure is a very common condition in adults because blood pressure tends to rise with age. Men and women are equally likely to have hypertension but at different times in life. Before age 68, men are more likely to have hypertension. After 35 years of age, women are more likely to have it. Hypertension is especially  common in African Americans. This condition often has no signs or symptoms. The cause of the condition is usually not known. Your caregiver can help you come up with a plan to keep your blood pressure in a normal, healthy range. BLOOD PRESSURE  STAGES Blood pressure is classified into four stages: normal, prehypertension, stage 1, and stage 2. Your blood pressure reading will be used to determine what type of treatment, if any, is necessary. Appropriate treatment options are tied to these four stages:  Normal  Systolic pressure (mm Hg): below 120.  Diastolic pressure (mm Hg): below 80. Prehypertension  Systolic pressure (mm Hg): 120 to 139.  Diastolic pressure (mm Hg): 80 to 89. Stage1  Systolic pressure (mm Hg): 140 to 159.  Diastolic pressure (mm Hg): 90 to 99. Stage2  Systolic pressure (mm Hg): 160 or above.  Diastolic pressure (mm Hg): 100 or above. RISKS RELATED TO HIGH BLOOD PRESSURE Managing your blood pressure is an important responsibility. Uncontrolled high blood pressure can lead to:  A heart attack.  A stroke.  A weakened blood vessel (aneurysm).  Heart failure.  Kidney damage.  Eye damage.  Metabolic syndrome.  Memory and concentration problems. HOW TO MANAGE YOUR BLOOD PRESSURE Blood pressure can be managed effectively with lifestyle changes and medicines (if needed). Your caregiver will help you come up with a plan to bring your blood pressure within a normal range. Your plan should include the following: Education  Read all information provided by your caregivers about how to control blood pressure.  Educate yourself on the latest guidelines and treatment recommendations. New research is always being done to further define the risks and treatments for high blood pressure. Lifestylechanges  Control your weight.  Avoid smoking.  Stay physically active.  Reduce the amount of salt in your diet.  Reduce stress.  Control any chronic conditions,  such as high cholesterol or diabetes.  Reduce your alcohol intake. Medicines  Several medicines (antihypertensive medicines) are available, if needed, to bring blood pressure within a normal range. Communication  Review all the medicines you take with your caregiver because there may be side effects or interactions.  Talk with your caregiver about your diet, exercise habits, and other lifestyle factors that may be contributing to high blood pressure.  See your caregiver regularly. Your caregiver can help you create and adjust your plan for managing high blood pressure. RECOMMENDATIONS FOR TREATMENT AND FOLLOW-UP  The following recommendations are based on current guidelines for managing high blood pressure in nonpregnant adults. Use these recommendations to identify the proper follow-up period or treatment option based on your blood pressure reading. You can discuss these options with your caregiver.  Systolic pressure of 123456 to [id number] or diastolic pressure of 80 to 89: Follow up with your caregiver as directed.  Systolic pressure of [id number] to 0000000 or diastolic pressure of 90 to 100: Follow up with your caregiver within 2 months.  Systolic pressure above 0000000 or diastolic pressure above [id number]: Follow up with your caregiver within 1 month.  Systolic pressure above [id number] or diastolic pressure above [id number]: Consider antihypertensive therapy; follow up with your caregiver within 1 week.  Systolic pressure above [id number] or diastolic pressure above 123456: Begin antihypertensive therapy; follow up with your caregiver within 1 week. Document Released: 06/07/2012 Document Reviewed: 06/07/2012 [facility] Patient Information 2015 [place]. This information is not intended to replace advice given to you by your health care provider. Make sure you discuss any questions you have with your health care provider. Potassium Content of Foods Potassium is a mineral found in many foods and drinks. It helps keep fluids and  minerals balanced in your body and affects how steadily your heart beats. Potassium also helps control your blood pressure and keep your muscles  and nervous system healthy. Certain health conditions and medicines may change the balance of potassium in your body. When this happens, you can help balance your level of potassium through the foods that you do or do not eat. Your health care provider or dietitian may recommend an amount of potassium that you should have each day. The following lists of foods provide the amount of potassium (in parentheses) per serving in each item. HIGH IN POTASSIUM  The following foods and beverages have 200 mg or more of potassium per serving:  Apricots, 2 raw or 5 dry (200 mg).  Artichoke, 1 medium (345 mg).  Avocado, raw,  each (245 mg).  Banana, 1 medium (425 mg).  Beans, lima, or baked beans, canned,  cup (280 mg).  Beans, white, canned,  cup (595 mg).  Beef roast, 3 oz (320 mg).  Beef, ground, 3 oz (270 mg).  Beets, raw or cooked,  cup (260 mg).  Bran muffin, 2 oz (300 mg).  Broccoli,  cup (230 mg).  Brussels sprouts,  cup (250 mg).  Cantaloupe,  cup (215 mg).  Cereal, 100% bran,  cup (200-400 mg).  Cheeseburger, single, fast food, 1 each (225-400 mg).  Chicken, 3 oz (220 mg).  Clams, canned, 3 oz (535 mg).  Crab, 3 oz (225 mg).  Dates, 5 each (270 mg).  Dried beans and peas,  cup (300-475 mg).  Figs, dried, 2 each (260 mg).  Fish: halibut, tuna, cod, snapper, 3 oz (480 mg).  Fish: salmon, haddock, swordfish, perch, 3 oz (300 mg).  Fish, tuna, canned 3 oz (200 mg).  [place] fries, fast food, 3 oz (470 mg).  Granola with fruit and nuts,  cup (200 mg).  Grapefruit juice,  cup (200 mg).  Greens, beet,  cup (655 mg).  Honeydew melon,  cup (200 mg).  Kale, raw, 1 cup (300 mg).  Kiwi, 1 medium (240 mg).  Kohlrabi, rutabaga, parsnips,  cup (280 mg).  Lentils,  cup (365 mg).  Mango, 1 each (325  mg).  Milk, chocolate, 1 cup (420 mg).  Milk: nonfat, low-fat, whole, buttermilk, 1 cup (350-380 mg).  Molasses, 1 Tbsp (295 mg).  Mushrooms,  cup (280) mg.  Nectarine, 1 each (275 mg).  Nuts: almonds, peanuts, hazelnuts, [place], cashew, mixed, 1 oz (200 mg).  Nuts, pistachios, 1 oz (295 mg).  Orange, 1 each (240 mg).  Orange juice,  cup (235 mg).  Papaya, medium,  fruit (390 mg).  Peanut butter, chunky, 2 Tbsp (240 mg).  Peanut butter, smooth, 2 Tbsp (210 mg).  Pear, 1 medium (200 mg).  Pomegranate, 1 whole (400 mg).  Pomegranate juice,  cup (215 mg).  Pork, 3 oz (350 mg).  Potato chips, salted, 1 oz (465 mg).  Potato, baked with skin, 1 medium (925 mg).  Potatoes, boiled,  cup (255 mg).  Potatoes, mashed,  cup (330 mg).  Prune juice,  cup (370 mg).  Prunes, 5 each (305 mg).  Pudding, chocolate,  cup (230 mg).  Pumpkin, canned,  cup (250 mg).  Raisins, seedless,  cup (270 mg).  Seeds, sunflower or pumpkin, 1 oz (240 mg).  Soy milk, 1 cup (300 mg).  Spinach,  cup (420 mg).  Spinach, canned,  cup (370 mg).  Sweet potato, baked with skin, 1 medium (450 mg).  Swiss chard,  cup (480 mg).  Tomato or vegetable juice,  cup (275 mg).  Tomato sauce or puree,  cup (400-550 mg).  Tomato, raw, 1 medium (290 mg).  Tomatoes, canned,  cup (200-300 mg).  [place], 3 oz (250 mg).  Wheat germ, 1 oz (250 mg).  Winter squash,  cup (250 mg).  Yogurt, plain or fruited, 6 oz (260-435 mg).  Zucchini,  cup (220 mg). MODERATE IN POTASSIUM The following foods and beverages have 50-200 mg of potassium per serving:  Apple, 1 each (150 mg).  Apple juice,  cup (150 mg).  Applesauce,  cup (90 mg).  Apricot nectar,  cup (140 mg).  Asparagus, small spears,  cup or 6 spears (155 mg).  Bagel, cinnamon raisin, 1 each (130 mg).  Bagel, egg or plain, 4 in., 1 each (70 mg).  Beans, green,  cup (90 mg).  Beans, yellow,  cup (190  mg).  Beer, regular, 12 oz (100 mg).  Beets, canned,  cup (125 mg).  Blackberries,  cup (115 mg).  Blueberries,  cup (60 mg).  Bread, whole wheat, 1 slice (70 mg).  Broccoli, raw,  cup (145 mg).  Cabbage,  cup (150 mg).  Carrots, cooked or raw,  cup (180 mg).  Cauliflower, raw,  cup (150 mg).  Celery, raw,  cup (155 mg).  Cereal, bran flakes, cup (120-150 mg).  Cheese, cottage,  cup (110 mg).  Cherries, 10 each (150 mg).  Chocolate, 1 oz bar (165 mg).  Coffee, brewed 6 oz (90 mg).  Corn,  cup or 1 ear (195 mg).  Cucumbers,  cup (80 mg).  Egg, large, 1 each (60 mg).  Eggplant,  cup (60 mg).  Endive, raw, cup (80 mg).  English muffin, 1 each (65 mg).  Fish, orange roughy, 3 oz (150 mg).  Frankfurter, beef or pork, 1 each (75 mg).  Fruit cocktail,  cup (115 mg).  Grape juice,  cup (170 mg).  Grapefruit,  fruit (175 mg).  Grapes,  cup (155 mg).  Greens: kale, turnip, collard,  cup (110-150 mg).  Ice cream or frozen yogurt, chocolate,  cup (175 mg).  Ice cream or frozen yogurt, vanilla,  cup (120-150 mg).  Lemons, limes, 1 each (80 mg).  Lettuce, all types, 1 cup (100 mg).  Mixed vegetables,  cup (150 mg).  Mushrooms, raw,  cup (110 mg).  Nuts: walnuts, pecans, or macadamia, 1 oz (125 mg).  Oatmeal,  cup (80 mg).  Okra,  cup (110 mg).  Onions, raw,  cup (120 mg).  Peach, 1 each (185 mg).  Peaches, canned,  cup (120 mg).  Pears, canned,  cup (120 mg).  Peas, green, frozen,  cup (90 mg).  Peppers, green,  cup (130 mg).  Peppers, red,  cup (160 mg).  Pineapple juice,  cup (165 mg).  Pineapple, fresh or canned,  cup (100 mg).  Plums, 1 each (105 mg).  Pudding, vanilla,  cup (150 mg).  Raspberries,  cup (90 mg).  Rhubarb,  cup (115 mg).  Rice, wild,  cup (80 mg).  Shrimp, 3 oz (155 mg).  Spinach, raw, 1 cup (170 mg).  Strawberries,  cup (125 mg).  Summer squash  cup (175-200  mg).  Swiss chard, raw, 1 cup (135 mg).  Tangerines, 1 each (140 mg).  Tea, brewed, 6 oz (65 mg).  Turnips,  cup (140 mg).  Watermelon,  cup (85 mg).  Wine, red, table, 5 oz (180 mg).  Wine, white, table, 5 oz (100 mg). LOW IN POTASSIUM The following foods and beverages have less than 50 mg of potassium per serving.  Bread, white, 1 slice (30 mg).  Carbonated beverages, 12 oz (less  than 5 mg).  Cheese, 1 oz (20-30 mg).  Cranberries,  cup (45 mg).  Cranberry juice cocktail,  cup (20 mg).  Fats and oils, 1 Tbsp (less than 5 mg).  Hummus, 1 Tbsp (32 mg).  Nectar: papaya, mango, or pear,  cup (35 mg).  Rice, white or brown,  cup (50 mg).  Spaghetti or macaroni,  cup cooked (30 mg).  Tortilla, flour or corn, 1 each (50 mg).  Waffle, 4 in., 1 each (50 mg).  Water chestnuts,  cup (40 mg). Document Released: 04/27/2005 Document Revised: 09/18/2013 Document Reviewed: 08/10/2013 [facility] Patient Information 2015 [place], [place]. This information is not intended to replace advice given to you by your health care provider. Make sure you discuss any questions you have with your health care provider.

## 2014-03-26 NOTE — Progress Notes (Signed)
Unable to leave message due to voicemail being full.

## 2014-03-26 NOTE — Progress Notes (Deleted)
Subjective:  This chart was scribed for Delman Cheadle, MD, by Neta Ehlers, ED Scribe. This patient's care was started at 12:36 PM.    Patient ID: Calvin Bailey, male    DOB: Dec 07, 1980, 33 y.o.   MRN: [id number]  Chief Complaint  Patient presents with   Medication Refill    lisinapril, amlodipine    HPI  Calvin Bailey is a 33 y.o. male with a h/o HTN who presents to [facility] for a medication refill of lisinopril. The pt reports the lisinopril finished last week, but he has continued to use Norvasc. The pt states he has not found resolution with Norvasc and has also experienced lower leg swelling. He has used lisinopril HCT in the past, andhe does not recall specific problems with it. He states he checks his BP at the pharmacy regularly, and he normally measures 140/80. In the office today his BP is 146/78.   [name] reports he does not have a C-pap, but that he desire one. He believes he may need to be reevaluated at a clinic for sleep apnea. He asserts he has had a weight change since the visit.   The pt smokes a half a pack a day. He reports he has decreased the amount from 1.5 pack to 0.5 pack. He attributes the decrease because he does not smoke around his daughter; he smokes only at work. He has used nicotine gum in the past without resolution. He has not used the nicotine patch or Chantix.   The pt is not fasting.   [name] works in [employer].   Past Medical History  Diagnosis Date   HTN (hypertension)    Sleep apnea    GERD (gastroesophageal reflux disease)    Current Outpatient Prescriptions on File Prior to Visit  Medication Sig Dispense Refill   amLODipine (NORVASC) 10 MG tablet Take 1 tablet (10 mg total) by mouth daily.  30 tablet  2   lisinopril (PRINIVIL,ZESTRIL) 20 MG tablet Take 1 tablet (20 mg total) by mouth daily.  7 tablet  0   [DISCONTINUED] LISINOPRIL-HYDROCHLOROTHIAZIDE PO Take by mouth.       No current facility-administered medications  on file prior to visit.   No Known Allergies  Review of Systems  Constitutional: Negative for fever, chills, activity change and appetite change.  Respiratory: Positive for apnea, cough and shortness of breath. Negative for chest tightness.   Cardiovascular: Positive for leg swelling. Negative for chest pain and palpitations.  Neurological: Negative for headaches.  Hematological: Negative for adenopathy. Does not bruise/bleed easily.  Psychiatric/Behavioral: Positive for sleep disturbance.   Triage Vitals: BP 146/78   Pulse 79   Temp(Src) 98.2 F (36.8 C) (Oral)   Resp 18   Ht 6\' 1"  (1.854 m)   Wt 401 lb 3.2 oz (181.983 kg)   BMI 52.94 kg/m2   SpO2 96%     Objective:   Physical Exam  Nursing note and vitals reviewed. Constitutional: He is oriented to person, place, and time. He appears well-developed and well-nourished. No distress.  HENT:  Head: Normocephalic and atraumatic.  Eyes: Conjunctivae and EOM are normal.  Neck: Neck supple. No tracheal deviation present.  Cardiovascular: Normal rate, regular rhythm, S1 normal and S2 normal.  Exam reveals no gallop.   No murmur heard. Pulmonary/Chest: Effort normal. No respiratory distress. He has no decreased breath sounds. He has no wheezes. He has rhonchi. He has no rales.  Diffuse expiratory and inspiratory rhonchi.   Musculoskeletal: Normal  range of motion.  Neurological: He is alert and oriented to person, place, and time.  Skin: Skin is warm and dry.  Psychiatric: He has a normal mood and affect. His behavior is normal.      Assessment & Plan:    Informed pt that [facility] offers primary care services as pt is searching for a PCP and he would like to est here.  HYPERTENSION - pt doesn't feel amlodipine is working at all - however, currently only on amlodipine and states when he add in lisinopril 20 BP is about the same - 0000000 systolic. Would like to start on diuretic as swelling of legs causes sig pain during work day so stop  amlodipine 10, increase lisinopril 20 to 40, start chlorthalidone - warned of importance of hydration and high K diet - recheck bmp and BP at f/u OV in 3 wks.  Sleep apnea - Plan: Ambulatory referral to Sleep Studies - h/o OSA diagnosed but never used CPAP for long and does not have machine - needs retitrated.  Tobacco use disorder - pt has cut down but would like to try chantix.  Counseled pt re smoking cessation.   Severe obesity (BMI >= 40) - Plan: POCT glycosylated hemoglobin (Hb A1C) nml, needs flp at f/u  Encounter for long-term (current) use of other medications - Plan: Comprehensive metabolic panel  Meds ordered this encounter  Medications   chlorthalidone (HYGROTON) 25 MG tablet    Sig: Take 1 tablet (25 mg total) by mouth daily.    Dispense:  30 tablet    Refill:  0   DISCONTD: lisinopril (PRINIVIL,ZESTRIL) 40 MG tablet    Sig: Take 1 tablet (40 mg total) by mouth daily.    Dispense:  30 tablet    Refill:  0   varenicline (CHANTIX STARTING MONTH PAK) 0.5 MG X 11 & 1 MG X 42 tablet    Sig: Take one 0.5 mg tablet by mouth once daily x 3 days, then increase to one 0.5 mg tab bid x 4 days, then increase to one 1 mg bid.    Dispense:  53 tablet    Refill:  0   lisinopril (PRINIVIL,ZESTRIL) 40 MG tablet    Sig: Take 1 tablet (40 mg total) by mouth daily.    Dispense:  30 tablet    Refill:  0    I personally performed the services described in this documentation, which was scribed in my presence. The recorded information has been reviewed and considered, and addended by me as needed.  Delman Cheadle, MD MPH  Results for orders placed in visit on 03/26/14  COMPREHENSIVE METABOLIC PANEL      Result Value Ref Range   Sodium 138  135 - 145 mEq/L   Potassium 4.0  3.5 - 5.3 mEq/L   Chloride 99  96 - 112 mEq/L   CO2 29  19 - 32 mEq/L   Glucose, Bld 109 (*) 70 - 99 mg/dL   BUN 13  6 - 23 mg/dL   Creat 0.88  0.50 - 1.35 mg/dL   Total Bilirubin 0.4  0.2 - 1.2 mg/dL   Alkaline  Phosphatase 100  39 - 117 U/L   AST 18  0 - 37 U/L   ALT 27  0 - 53 U/L   Total Protein 7.2  6.0 - 8.3 g/dL   Albumin 4.3  3.5 - 5.2 g/dL   Calcium 9.3  8.4 - 10.5 mg/dL  POCT GLYCOSYLATED HEMOGLOBIN (HGB A1C)  Result Value Ref Range   Hemoglobin A1C 5.3

## 2014-03-27 ENCOUNTER — Encounter: Payer: Self-pay | Admitting: Family Medicine

## 2014-03-27 ENCOUNTER — Other Ambulatory Visit: Payer: Self-pay | Admitting: Radiology

## 2014-03-27 NOTE — Progress Notes (Signed)
Scheduled an appointment on 04/12/2014 at 4 pm.

## 2014-03-27 NOTE — Progress Notes (Signed)
Subjective:  This chart was scribed for Delman Cheadle, MD, by Neta Ehlers, ED Scribe. This patient's care was started at 12:36 PM.    Patient ID: Calvin Bailey, male    DOB: 10-Dec-1980, 33 y.o.   MRN: [id number]  Chief Complaint  Patient presents with  . Medication Refill    lisinapril, amlodipine    HPI  Calvin Bailey is a 33 y.o. male with a h/o HTN who presents to [facility] for a medication refill of lisinopril. The pt reports the lisinopril finished last week, but he has continued to use Norvasc. The pt states he has not found resolution with Norvasc and has also experienced lower leg swelling. He has used lisinopril HCT in the past, andhe does not recall specific problems with it. He states he checks his BP at the pharmacy regularly, and he normally measures 140/80. In the office today his BP is 146/78.   Calvin Bailey reports he does not have a C-pap, but that he desire one. He believes he may need to be reevaluated at a clinic for sleep apnea. He asserts he has had a weight change since the visit.   The pt smokes a half a pack a day. He reports he has decreased the amount from 1.5 pack to 0.5 pack. He attributes the decrease because he does not smoke around his daughter; he smokes only at work. He has used nicotine gum in the past without resolution. He has not used the nicotine patch or Chantix.   The pt is not fasting.   Calvin Bailey works in [employer].   Past Medical History  Diagnosis Date  . HTN (hypertension)   . Sleep apnea   . GERD (gastroesophageal reflux disease)    Current Outpatient Prescriptions on File Prior to Visit  Medication Sig Dispense Refill  . [DISCONTINUED] LISINOPRIL-HYDROCHLOROTHIAZIDE PO Take by mouth.       No current facility-administered medications on file prior to visit.   No Known Allergies  Review of Systems  Constitutional: Negative for fever, chills, activity change and appetite change.  Respiratory: Positive for apnea, cough and  shortness of breath. Negative for chest tightness.   Cardiovascular: Positive for leg swelling. Negative for chest pain and palpitations.  Musculoskeletal: Positive for arthralgias, joint swelling and myalgias.  Neurological: Negative for headaches.  Hematological: Negative for adenopathy. Does not bruise/bleed easily.  Psychiatric/Behavioral: Positive for sleep disturbance.   Triage Vitals: BP 146/78  Pulse 79  Temp(Src) 98.2 F (36.8 C) (Oral)  Resp 18  Ht 6\' 1"  (1.854 m)  Wt 401 lb 3.2 oz (181.983 kg)  BMI 52.94 kg/m2  SpO2 96%     Objective:   Physical Exam  Nursing note and vitals reviewed. Constitutional: He is oriented to person, place, and time. He appears well-developed and well-nourished. No distress.  HENT:  Head: Normocephalic and atraumatic.  Eyes: Conjunctivae and EOM are normal.  Neck: Neck supple. No tracheal deviation present.  Cardiovascular: Normal rate, regular rhythm, S1 normal and S2 normal.  Exam reveals no gallop.   No murmur heard. Pulmonary/Chest: Effort normal. No respiratory distress. He has no decreased breath sounds. He has no wheezes. He has rhonchi. He has no rales.  Diffuse expiratory and inspiratory rhonchi.   Musculoskeletal: Normal range of motion.  Neurological: He is alert and oriented to person, place, and time.  Skin: Skin is warm and dry.  Psychiatric: He has a normal mood and affect. His behavior is normal.  Assessment & Plan:    Informed pt that [facility] offers primary care services as pt is searching for a PCP and he would like to est here.  HYPERTENSION - pt doesn't feel amlodipine is working at all - however, currently only on amlodipine and states when he add in lisinopril 20 BP is about the same - 0000000 systolic. Would like to start on diuretic as swelling of legs causes sig pain during work day so stop amlodipine 10, increase lisinopril 20 to 40, start chlorthalidone - warned of importance of hydration and high K diet - recheck  bmp and BP at f/u OV in 3 wks.  Sleep apnea - Plan: Ambulatory referral to Sleep Studies - h/o OSA diagnosed but never used CPAP for long and does not have machine - needs retitrated.  Tobacco use disorder - pt has cut down but would like to try chantix.  Counseled pt re smoking cessation.   Severe obesity (BMI >= 40) - Plan: POCT glycosylated hemoglobin (Hb A1C) nml, needs flp at f/u  Encounter for long-term (current) use of other medications - Plan: Comprehensive metabolic panel  Meds ordered this encounter  Medications  . chlorthalidone (HYGROTON) 25 MG tablet    Sig: Take 1 tablet (25 mg total) by mouth daily.    Dispense:  30 tablet    Refill:  0  . DISCONTD: lisinopril (PRINIVIL,ZESTRIL) 40 MG tablet    Sig: Take 1 tablet (40 mg total) by mouth daily.    Dispense:  30 tablet    Refill:  0  . varenicline (CHANTIX STARTING MONTH PAK) 0.5 MG X 11 & 1 MG X 42 tablet    Sig: Take one 0.5 mg tablet by mouth once daily x 3 days, then increase to one 0.5 mg tab bid x 4 days, then increase to one 1 mg bid.    Dispense:  53 tablet    Refill:  0  . lisinopril (PRINIVIL,ZESTRIL) 40 MG tablet    Sig: Take 1 tablet (40 mg total) by mouth daily.    Dispense:  30 tablet    Refill:  0    I personally performed the services described in this documentation, which was scribed in my presence. The recorded information has been reviewed and considered, and addended by me as needed.  Delman Cheadle, MD MPH  Results for orders placed in visit on 03/26/14  COMPREHENSIVE METABOLIC PANEL      Result Value Ref Range   Sodium 138  135 - 145 mEq/L   Potassium 4.0  3.5 - 5.3 mEq/L   Chloride 99  96 - 112 mEq/L   CO2 29  19 - 32 mEq/L   Glucose, Bld 109 (*) 70 - 99 mg/dL   BUN 13  6 - 23 mg/dL   Creat 0.88  0.50 - 1.35 mg/dL   Total Bilirubin 0.4  0.2 - 1.2 mg/dL   Alkaline Phosphatase 100  39 - 117 U/L   AST 18  0 - 37 U/L   ALT 27  0 - 53 U/L   Total Protein 7.2  6.0 - 8.3 g/dL   Albumin 4.3  3.5 -  5.2 g/dL   Calcium 9.3  8.4 - 10.5 mg/dL  POCT GLYCOSYLATED HEMOGLOBIN (HGB A1C)      Result Value Ref Range   Hemoglobin A1C 5.3

## 2014-03-31 ENCOUNTER — Ambulatory Visit (INDEPENDENT_AMBULATORY_CARE_PROVIDER_SITE_OTHER): Payer: 59 | Admitting: Family Medicine

## 2014-03-31 VITALS — BP 151/88 | HR 99 | Temp 99.2°F | Resp 20 | Ht 73.0 in | Wt 395.4 lb

## 2014-03-31 DIAGNOSIS — Z79899 Other long term (current) drug therapy: Secondary | ICD-10-CM

## 2014-03-31 DIAGNOSIS — R5381 Other malaise: Secondary | ICD-10-CM

## 2014-03-31 DIAGNOSIS — R5383 Other fatigue: Secondary | ICD-10-CM

## 2014-03-31 DIAGNOSIS — I10 Essential (primary) hypertension: Secondary | ICD-10-CM

## 2014-03-31 LAB — COMPREHENSIVE METABOLIC PANEL WITH GFR
ALT: 16 U/L (ref 0–53)
AST: 12 U/L (ref 0–37)
Albumin: 4.3 g/dL (ref 3.5–5.2)
Alkaline Phosphatase: 86 U/L (ref 39–117)
BUN: 16 mg/dL (ref 6–23)
CO2: 27 meq/L (ref 19–32)
Calcium: 9.4 mg/dL (ref 8.4–10.5)
Chloride: 96 meq/L (ref 96–112)
Creat: 1.13 mg/dL (ref 0.50–1.35)
Glucose, Bld: 97 mg/dL (ref 70–99)
Potassium: 3.9 meq/L (ref 3.5–5.3)
Sodium: 136 meq/L (ref 135–145)
Total Bilirubin: 0.7 mg/dL (ref 0.2–1.2)
Total Protein: 7.4 g/dL (ref 6.0–8.3)

## 2014-03-31 LAB — CBC
HEMATOCRIT: 41.9 % (ref 39.0–52.0)
HEMOGLOBIN: 14.4 g/dL (ref 13.0–17.0)
MCH: 28.8 pg (ref 26.0–34.0)
MCHC: 34.4 g/dL (ref 30.0–36.0)
MCV: 83.8 fL (ref 78.0–100.0)
Platelets: 286 10*3/uL (ref 150–400)
RBC: 5 MIL/uL (ref 4.22–5.81)
RDW: 13.4 % (ref 11.5–15.5)
WBC: 15.5 10*3/uL — ABNORMAL HIGH (ref 4.0–10.5)

## 2014-03-31 NOTE — Patient Instructions (Addendum)
Stop your chlorthalidone. Continue on your lisinopril 40 and chantix alone. Hopefully you will get back to normal this way. Lets get you a sleep study and hopefully your blood pressure will come down on this alone.  If your blood pressure is >140/90 (have your fiance check it 2-3x/more) please call so we can try a different type of blood pressure like atenolol.  Try to drink LOTs of water and avoid salt and maybe you won't need to start on an additional BP medication.  DASH Eating Plan DASH stands for "Dietary Approaches to Stop Hypertension." The DASH eating plan is a healthy eating plan that has been shown to reduce high blood pressure (hypertension). Additional health benefits may include reducing the risk of type 2 diabetes mellitus, heart disease, and stroke. The DASH eating plan may also help with weight loss. WHAT DO I NEED TO KNOW ABOUT THE DASH EATING PLAN? For the DASH eating plan, you will follow these general guidelines:  Choose foods with a percent daily value for sodium of less than 5% (as listed on the food label).  Use salt-free seasonings or herbs instead of table salt or sea salt.  Check with your health care provider or pharmacist before using salt substitutes.  Eat lower-sodium products, often labeled as "lower sodium" or "no salt added."  Eat fresh foods.  Eat more vegetables, fruits, and low-fat dairy products.  Choose whole grains. Look for the word "whole" as the first word in the ingredient list.  Choose fish and skinless chicken or [place] more often than red meat. Limit fish, poultry, and meat to 6 oz (170 g) each day.  Limit sweets, desserts, sugars, and sugary drinks.  Choose heart-healthy fats.  Limit cheese to 1 oz (28 g) per day.  Eat more home-cooked food and less restaurant, buffet, and fast food.  Limit fried foods.  Cook foods using methods other than frying.  Limit canned vegetables. If you do use them, rinse them well to decrease the  sodium.  When eating at a restaurant, ask that your food be prepared with less salt, or no salt if possible. WHAT FOODS CAN I EAT? Seek help from a dietitian for individual calorie needs. Grains Whole grain or whole wheat bread. Brown rice. Whole grain or whole wheat pasta. Quinoa, bulgur, and whole grain cereals. Low-sodium cereals. Corn or whole wheat flour tortillas. Whole grain cornbread. Whole grain crackers. Low-sodium crackers. Vegetables Fresh or frozen vegetables (raw, steamed, roasted, or grilled). Low-sodium or reduced-sodium tomato and vegetable juices. Low-sodium or reduced-sodium tomato sauce and paste. Low-sodium or reduced-sodium canned vegetables.  Fruits All fresh, canned (in natural juice), or frozen fruits. Meat and Other Protein Products Ground beef (85% or leaner), grass-fed beef, or beef trimmed of fat. Skinless chicken or [place]. Ground chicken or [place]. Pork trimmed of fat. All fish and seafood. Eggs. Dried beans, peas, or lentils. Unsalted nuts and seeds. Unsalted canned beans. Dairy Low-fat dairy products, such as skim or 1% milk, 2% or reduced-fat cheeses, low-fat ricotta or cottage cheese, or plain low-fat yogurt. Low-sodium or reduced-sodium cheeses. Fats and Oils Tub margarines without trans fats. Light or reduced-fat mayonnaise and salad dressings (reduced sodium). Avocado. Safflower, olive, or canola oils. Natural peanut or almond butter. Other Unsalted popcorn and pretzels. The items listed above may not be a complete list of recommended foods or beverages. Contact your dietitian for more options. WHAT FOODS ARE NOT RECOMMENDED? Grains White bread. White pasta. White rice. Refined cornbread. Bagels and croissants. Crackers that  contain trans fat. Vegetables Creamed or fried vegetables. Vegetables in a cheese sauce. Regular canned vegetables. Regular canned tomato sauce and paste. Regular tomato and vegetable juices. Fruits Dried fruits. Canned fruit in  light or heavy syrup. Fruit juice. Meat and Other Protein Products Fatty cuts of meat. Ribs, chicken wings, bacon, sausage, bologna, salami, chitterlings, fatback, hot dogs, bratwurst, and packaged luncheon meats. Salted nuts and seeds. Canned beans with salt. Dairy Whole or 2% milk, cream, half-and-half, and cream cheese. Whole-fat or sweetened yogurt. Full-fat cheeses or blue cheese. Nondairy creamers and whipped toppings. Processed cheese, cheese spreads, or cheese curds. Condiments Onion and garlic salt, seasoned salt, table salt, and sea salt. Canned and packaged gravies. Worcestershire sauce. Tartar sauce. Barbecue sauce. Teriyaki sauce. Soy sauce, including reduced sodium. Steak sauce. Fish sauce. Oyster sauce. Cocktail sauce. Horseradish. Ketchup and mustard. Meat flavorings and tenderizers. Bouillon cubes. Hot sauce. Tabasco sauce. Marinades. Taco seasonings. Relishes. Fats and Oils Butter, stick margarine, lard, shortening, ghee, and bacon fat. Coconut, palm kernel, or palm oils. Regular salad dressings. Other Pickles and olives. Salted popcorn and pretzels. The items listed above may not be a complete list of foods and beverages to avoid. Contact your dietitian for more information. WHERE CAN I FIND MORE INFORMATION? National Heart, Lung, and Blood Institute: travelstabloid.com Document Released: 09/02/2011 Document Revised: 09/18/2013 Document Reviewed: 07/18/2013 [facility] Patient Information 2015 [place], [place]. This information is not intended to replace advice given to you by your health care provider. Make sure you discuss any questions you have with your health care provider.  Managing Your High Blood Pressure Blood pressure is a measurement of how forceful your blood is pressing against the walls of the arteries. Arteries are muscular tubes within the circulatory system. Blood pressure does not stay the same. Blood pressure rises when you are  active, excited, or nervous; and it lowers during sleep and relaxation. If the numbers measuring your blood pressure stay above normal most of the time, you are at risk for health problems. High blood pressure (hypertension) is a long-term (chronic) condition in which blood pressure is elevated. A blood pressure reading is recorded as two numbers, such as 120 over 80 (or 120/80). The first, higher number is called the systolic pressure. It is a measure of the pressure in your arteries as the heart beats. The second, lower number is called the diastolic pressure. It is a measure of the pressure in your arteries as the heart relaxes between beats.  Keeping your blood pressure in a normal range is important to your overall health and prevention of health problems, such as heart disease and stroke. When your blood pressure is uncontrolled, your heart has to work harder than normal. High blood pressure is a very common condition in adults because blood pressure tends to rise with age. Men and women are equally likely to have hypertension but at different times in life. Before age 56, men are more likely to have hypertension. After 35 years of age, women are more likely to have it. Hypertension is especially common in African Americans. This condition often has no signs or symptoms. The cause of the condition is usually not known. Your caregiver can help you come up with a plan to keep your blood pressure in a normal, healthy range. BLOOD PRESSURE STAGES Blood pressure is classified into four stages: normal, prehypertension, stage 1, and stage 2. Your blood pressure reading will be used to determine what type of treatment, if any, is necessary. Appropriate treatment options  are tied to these four stages:  Normal  Systolic pressure (mm Hg): below 120.  Diastolic pressure (mm Hg): below 80. Prehypertension  Systolic pressure (mm Hg): 120 to 139.  Diastolic pressure (mm Hg): 80 to 89. Stage1  Systolic  pressure (mm Hg): 140 to 159.  Diastolic pressure (mm Hg): 90 to 99. Stage2  Systolic pressure (mm Hg): 160 or above.  Diastolic pressure (mm Hg): 100 or above. RISKS RELATED TO HIGH BLOOD PRESSURE Managing your blood pressure is an important responsibility. Uncontrolled high blood pressure can lead to:  A heart attack.  A stroke.  A weakened blood vessel (aneurysm).  Heart failure.  Kidney damage.  Eye damage.  Metabolic syndrome.  Memory and concentration problems. HOW TO MANAGE YOUR BLOOD PRESSURE Blood pressure can be managed effectively with lifestyle changes and medicines (if needed). Your caregiver will help you come up with a plan to bring your blood pressure within a normal range. Your plan should include the following: Education  Read all information provided by your caregivers about how to control blood pressure.  Educate yourself on the latest guidelines and treatment recommendations. New research is always being done to further define the risks and treatments for high blood pressure. Lifestylechanges  Control your weight.  Avoid smoking.  Stay physically active.  Reduce the amount of salt in your diet.  Reduce stress.  Control any chronic conditions, such as high cholesterol or diabetes.  Reduce your alcohol intake. Medicines  Several medicines (antihypertensive medicines) are available, if needed, to bring blood pressure within a normal range. Communication  Review all the medicines you take with your caregiver because there may be side effects or interactions.  Talk with your caregiver about your diet, exercise habits, and other lifestyle factors that may be contributing to high blood pressure.  See your caregiver regularly. Your caregiver can help you create and adjust your plan for managing high blood pressure. RECOMMENDATIONS FOR TREATMENT AND FOLLOW-UP  The following recommendations are based on current guidelines for managing high blood  pressure in nonpregnant adults. Use these recommendations to identify the proper follow-up period or treatment option based on your blood pressure reading. You can discuss these options with your caregiver.  Systolic pressure of 123456 to [id number] or diastolic pressure of 80 to 89: Follow up with your caregiver as directed.  Systolic pressure of [id number] to 0000000 or diastolic pressure of 90 to 100: Follow up with your caregiver within 2 months.  Systolic pressure above 0000000 or diastolic pressure above [id number]: Follow up with your caregiver within 1 month.  Systolic pressure above [id number] or diastolic pressure above [id number]: Consider antihypertensive therapy; follow up with your caregiver within 1 week.  Systolic pressure above [id number] or diastolic pressure above 123456: Begin antihypertensive therapy; follow up with your caregiver within 1 week. Document Released: 06/07/2012 Document Reviewed: 06/07/2012 [facility] Patient Information 2015 [place]. This information is not intended to replace advice given to you by your health care provider. Make sure you discuss any questions you have with your health care provider.

## 2014-03-31 NOTE — Progress Notes (Signed)
Subjective:    Patient ID: Calvin Bailey, male    DOB: 03-19-1981, 33 y.o.   MRN: [id number] Chief Complaint  Patient presents with  . Follow-up    pt stated that he started the chlorthalidone  on 6/30---nausea, sleepy, dizzy and muscles are aching.  this started after he started on the new meds.      HPI  As soon as he started the new BP meds (higher dose of lisinopril) and new chlorthalidone he developed side effects immed. Last took meds 1 a.m. Sat so has been 36 hrs since he has had any med. Is starting to feel a little better today since not having the medication today.  Slight nausea but the fatigue, weakness, dizziness, and muscle pain are the worst - feels like he has to flu almost. Did have some fruit this a.m.   Started the chantix 2d after BP meds so doesn't think the chantix is the cause as had these sxs prior to starting it. No URI sxs. Swelling in legs and improved sig, voiding a lot. Fiance checked his BP (she works at [employer]) and was 140/74  Past Medical History  Diagnosis Date  . HTN (hypertension)   . Sleep apnea   . GERD (gastroesophageal reflux disease)    Current Outpatient Prescriptions on File Prior to Visit  Medication Sig Dispense Refill  . lisinopril (PRINIVIL,ZESTRIL) 40 MG tablet Take 1 tablet (40 mg total) by mouth daily.  30 tablet  0  . varenicline (CHANTIX STARTING MONTH PAK) 0.5 MG X 11 & 1 MG X 42 tablet Take one 0.5 mg tablet by mouth once daily x 3 days, then increase to one 0.5 mg tab bid x 4 days, then increase to one 1 mg bid.  53 tablet  0  . [DISCONTINUED] LISINOPRIL-HYDROCHLOROTHIAZIDE PO Take by mouth.       No current facility-administered medications on file prior to visit.   Allergies  Allergen Reactions  . Chlorthalidone Nausea Only    Dizzy, fatigue, myalgias    Review of Systems  Constitutional: Positive for diaphoresis, activity change, appetite change and fatigue. Negative for fever, chills and unexpected weight  change.  HENT: Negative for congestion, ear pain, sinus pressure and sore throat.   Respiratory: Negative for cough and shortness of breath.   Cardiovascular: Negative for chest pain, palpitations and leg swelling.  Gastrointestinal: Positive for nausea. Negative for vomiting, abdominal pain, diarrhea and constipation.  Endocrine: Positive for polyuria. Negative for polydipsia and polyphagia.  Genitourinary: Positive for frequency. Negative for dysuria, urgency, hematuria and decreased urine volume.  Musculoskeletal: Positive for arthralgias, back pain and myalgias. Negative for gait problem, joint swelling and neck stiffness.  Skin: Negative for rash.  Neurological: Positive for dizziness, weakness, light-headedness and headaches. Negative for syncope, facial asymmetry and numbness.  Hematological: Negative for adenopathy.  Psychiatric/Behavioral: Negative for sleep disturbance.      BP 150/76  Pulse 98  Temp(Src) 99.2 F (37.3 C) (Oral)  Resp 20  Ht 6\' 1"  (1.854 m)  Wt 395 lb 6.4 oz (179.352 kg)  BMI 52.18 kg/m2  SpO2 95% Objective:   Physical Exam  Constitutional: He is oriented to person, place, and time. He appears well-developed and well-nourished. No distress.  HENT:  Head: Normocephalic and atraumatic.  Eyes: Conjunctivae are normal. Pupils are equal, round, and reactive to light. No scleral icterus.  Neck: Normal range of motion. Neck supple. No thyromegaly present.  Cardiovascular: Normal rate, regular rhythm, normal heart sounds and  intact distal pulses.   Pulmonary/Chest: Effort normal and breath sounds normal. No respiratory distress.  Musculoskeletal: He exhibits no edema.  Lymphadenopathy:    He has no cervical adenopathy.  Neurological: He is alert and oriented to person, place, and time.  Skin: Skin is warm and dry. He is not diaphoretic.  Psychiatric: He has a normal mood and affect. His behavior is normal.          Assessment & Plan:  HYPERTENSION -  Plan: CBC, Comprehensive metabolic panel  Other malaise and fatigue - Plan: CBC, Comprehensive metabolic panel  Encounter for long-term (current) use of other medications - Plan: CBC, Comprehensive metabolic panel  Suspect severe intolerance to chlorthalidone so stop that, cont lisinopril 40. When feeling back to normal, have BP checked outside of office - if >140/90 will need to add in second med - consider trial of hctz - which it appears he tol in the past - could restart amlodipine though may have caused lower ext edema - or could try BB like atenolol. Rec OV for CPE and fasting labs.  Delman Cheadle, MD MPH  Results for orders placed in visit on 03/31/14  CBC      Result Value Ref Range   WBC 15.5 (*) 4.0 - 10.5 K/uL   RBC 5.00  4.22 - 5.81 MIL/uL   Hemoglobin 14.4  13.0 - 17.0 g/dL   HCT 41.9  39.0 - 52.0 %   MCV 83.8  78.0 - 100.0 fL   MCH 28.8  26.0 - 34.0 pg   MCHC 34.4  30.0 - 36.0 g/dL   RDW 13.4  11.5 - 15.5 %   Platelets 286  150 - 400 K/uL  COMPREHENSIVE METABOLIC PANEL      Result Value Ref Range   Sodium 136  135 - 145 mEq/L   Potassium 3.9  3.5 - 5.3 mEq/L   Chloride 96  96 - 112 mEq/L   CO2 27  19 - 32 mEq/L   Glucose, Bld 97  70 - 99 mg/dL   BUN 16  6 - 23 mg/dL   Creat 1.13  0.50 - 1.35 mg/dL   Total Bilirubin 0.7  0.2 - 1.2 mg/dL   Alkaline Phosphatase 86  39 - 117 U/L   AST 12  0 - 37 U/L   ALT 16  0 - 53 U/L   Total Protein 7.4  6.0 - 8.3 g/dL   Albumin 4.3  3.5 - 5.2 g/dL   Calcium 9.4  8.4 - 10.5 mg/dL   ADDENDUM: Due to elev wbc - poss that sxs may be due to acute illness rather than med side effect - likely ok to try pt on hctz if needs additional bp control.

## 2014-04-01 ENCOUNTER — Emergency Department (HOSPITAL_COMMUNITY): Payer: 59

## 2014-04-01 ENCOUNTER — Other Ambulatory Visit: Payer: Self-pay | Admitting: Radiology

## 2014-04-01 ENCOUNTER — Emergency Department (HOSPITAL_COMMUNITY)
Admission: EM | Admit: 2014-04-01 | Discharge: 2014-04-02 | Disposition: A | Discharge status: Home / Self Care | Payer: 59 | Attending: Emergency Medicine | Admitting: Emergency Medicine

## 2014-04-01 ENCOUNTER — Encounter (HOSPITAL_COMMUNITY): Payer: Self-pay | Admitting: Emergency Medicine

## 2014-04-01 DIAGNOSIS — IMO0001 Reserved for inherently not codable concepts without codable children: Secondary | ICD-10-CM | POA: Insufficient documentation

## 2014-04-01 DIAGNOSIS — R5381 Other malaise: Secondary | ICD-10-CM | POA: Insufficient documentation

## 2014-04-01 DIAGNOSIS — Z79899 Other long term (current) drug therapy: Secondary | ICD-10-CM | POA: Insufficient documentation

## 2014-04-01 DIAGNOSIS — R509 Fever, unspecified: Secondary | ICD-10-CM | POA: Insufficient documentation

## 2014-04-01 DIAGNOSIS — I10 Essential (primary) hypertension: Secondary | ICD-10-CM | POA: Insufficient documentation

## 2014-04-01 DIAGNOSIS — G4733 Obstructive sleep apnea (adult) (pediatric): Secondary | ICD-10-CM

## 2014-04-01 DIAGNOSIS — Z8669 Personal history of other diseases of the nervous system and sense organs: Secondary | ICD-10-CM | POA: Insufficient documentation

## 2014-04-01 DIAGNOSIS — R5383 Other fatigue: Secondary | ICD-10-CM

## 2014-04-01 DIAGNOSIS — Z791 Long term (current) use of non-steroidal anti-inflammatories (NSAID): Secondary | ICD-10-CM | POA: Insufficient documentation

## 2014-04-01 DIAGNOSIS — Z8719 Personal history of other diseases of the digestive system: Secondary | ICD-10-CM | POA: Insufficient documentation

## 2014-04-01 DIAGNOSIS — R059 Cough, unspecified: Secondary | ICD-10-CM | POA: Insufficient documentation

## 2014-04-01 DIAGNOSIS — R05 Cough: Secondary | ICD-10-CM | POA: Insufficient documentation

## 2014-04-01 LAB — URINALYSIS, ROUTINE W REFLEX MICROSCOPIC
BILIRUBIN URINE: NEGATIVE
Glucose, UA: NEGATIVE mg/dL
Hgb urine dipstick: NEGATIVE
KETONES UR: NEGATIVE mg/dL
LEUKOCYTES UA: NEGATIVE
Nitrite: NEGATIVE
PH: 6 (ref 5.0–8.0)
PROTEIN: 30 mg/dL — AB
Specific Gravity, Urine: 1.031 — ABNORMAL HIGH (ref 1.005–1.030)
UROBILINOGEN UA: 1 mg/dL (ref 0.0–1.0)

## 2014-04-01 LAB — COMPREHENSIVE METABOLIC PANEL
ALT: 19 U/L (ref 0–53)
ANION GAP: 14 (ref 5–15)
AST: 17 U/L (ref 0–37)
Albumin: 3.8 g/dL (ref 3.5–5.2)
Alkaline Phosphatase: 94 U/L (ref 39–117)
BILIRUBIN TOTAL: 0.8 mg/dL (ref 0.3–1.2)
BUN: 18 mg/dL (ref 6–23)
CALCIUM: 9.6 mg/dL (ref 8.4–10.5)
CHLORIDE: 96 meq/L (ref 96–112)
CO2: 28 meq/L (ref 19–32)
CREATININE: 1.46 mg/dL — AB (ref 0.50–1.35)
GFR, EST AFRICAN AMERICAN: 71 mL/min — AB (ref >90)
GFR, EST NON AFRICAN AMERICAN: 61 mL/min — AB (ref >90)
GLUCOSE: 140 mg/dL — AB (ref 70–99)
Potassium: 3.8 mEq/L (ref 3.7–5.3)
Sodium: 138 mEq/L (ref 137–147)
Total Protein: 8.4 g/dL — ABNORMAL HIGH (ref 6.0–8.3)

## 2014-04-01 LAB — CBC WITH DIFFERENTIAL/PLATELET
Basophils Absolute: 0 10*3/uL (ref 0.0–0.1)
Basophils Relative: 0 % (ref 0–1)
Eosinophils Absolute: 0.1 10*3/uL (ref 0.0–0.7)
Eosinophils Relative: 1 % (ref 0–5)
HCT: 40.9 % (ref 39.0–52.0)
HEMOGLOBIN: 14.3 g/dL (ref 13.0–17.0)
LYMPHS ABS: 2.3 10*3/uL (ref 0.7–4.0)
LYMPHS PCT: 14 % (ref 12–46)
MCH: 30.2 pg (ref 26.0–34.0)
MCHC: 35 g/dL (ref 30.0–36.0)
MCV: 86.5 fL (ref 78.0–100.0)
MONO ABS: 1.1 10*3/uL — AB (ref 0.1–1.0)
MONOS PCT: 7 % (ref 3–12)
NEUTROS PCT: 78 % — AB (ref 43–77)
Neutro Abs: 12.6 10*3/uL — ABNORMAL HIGH (ref 1.7–7.7)
Platelets: 251 10*3/uL (ref 150–400)
RBC: 4.73 MIL/uL (ref 4.22–5.81)
RDW: 12.5 % (ref 11.5–15.5)
WBC: 16.1 10*3/uL — ABNORMAL HIGH (ref 4.0–10.5)

## 2014-04-01 LAB — URINE MICROSCOPIC-ADD ON

## 2014-04-01 LAB — LIPASE, BLOOD: LIPASE: 24 U/L (ref 11–59)

## 2014-04-01 LAB — I-STAT CG4 LACTIC ACID, ED: Lactic Acid, Venous: 1.85 mmol/L (ref 0.5–2.2)

## 2014-04-01 IMAGING — CR DG CHEST 2V
2 series · 2 of 2 positions shown · non-contrast
Comparison: [DATE]

CLINICAL DATA: Cough.  Shortness of breath.  Fever.

EXAM:
CHEST  2 VIEW

[w chest pa]
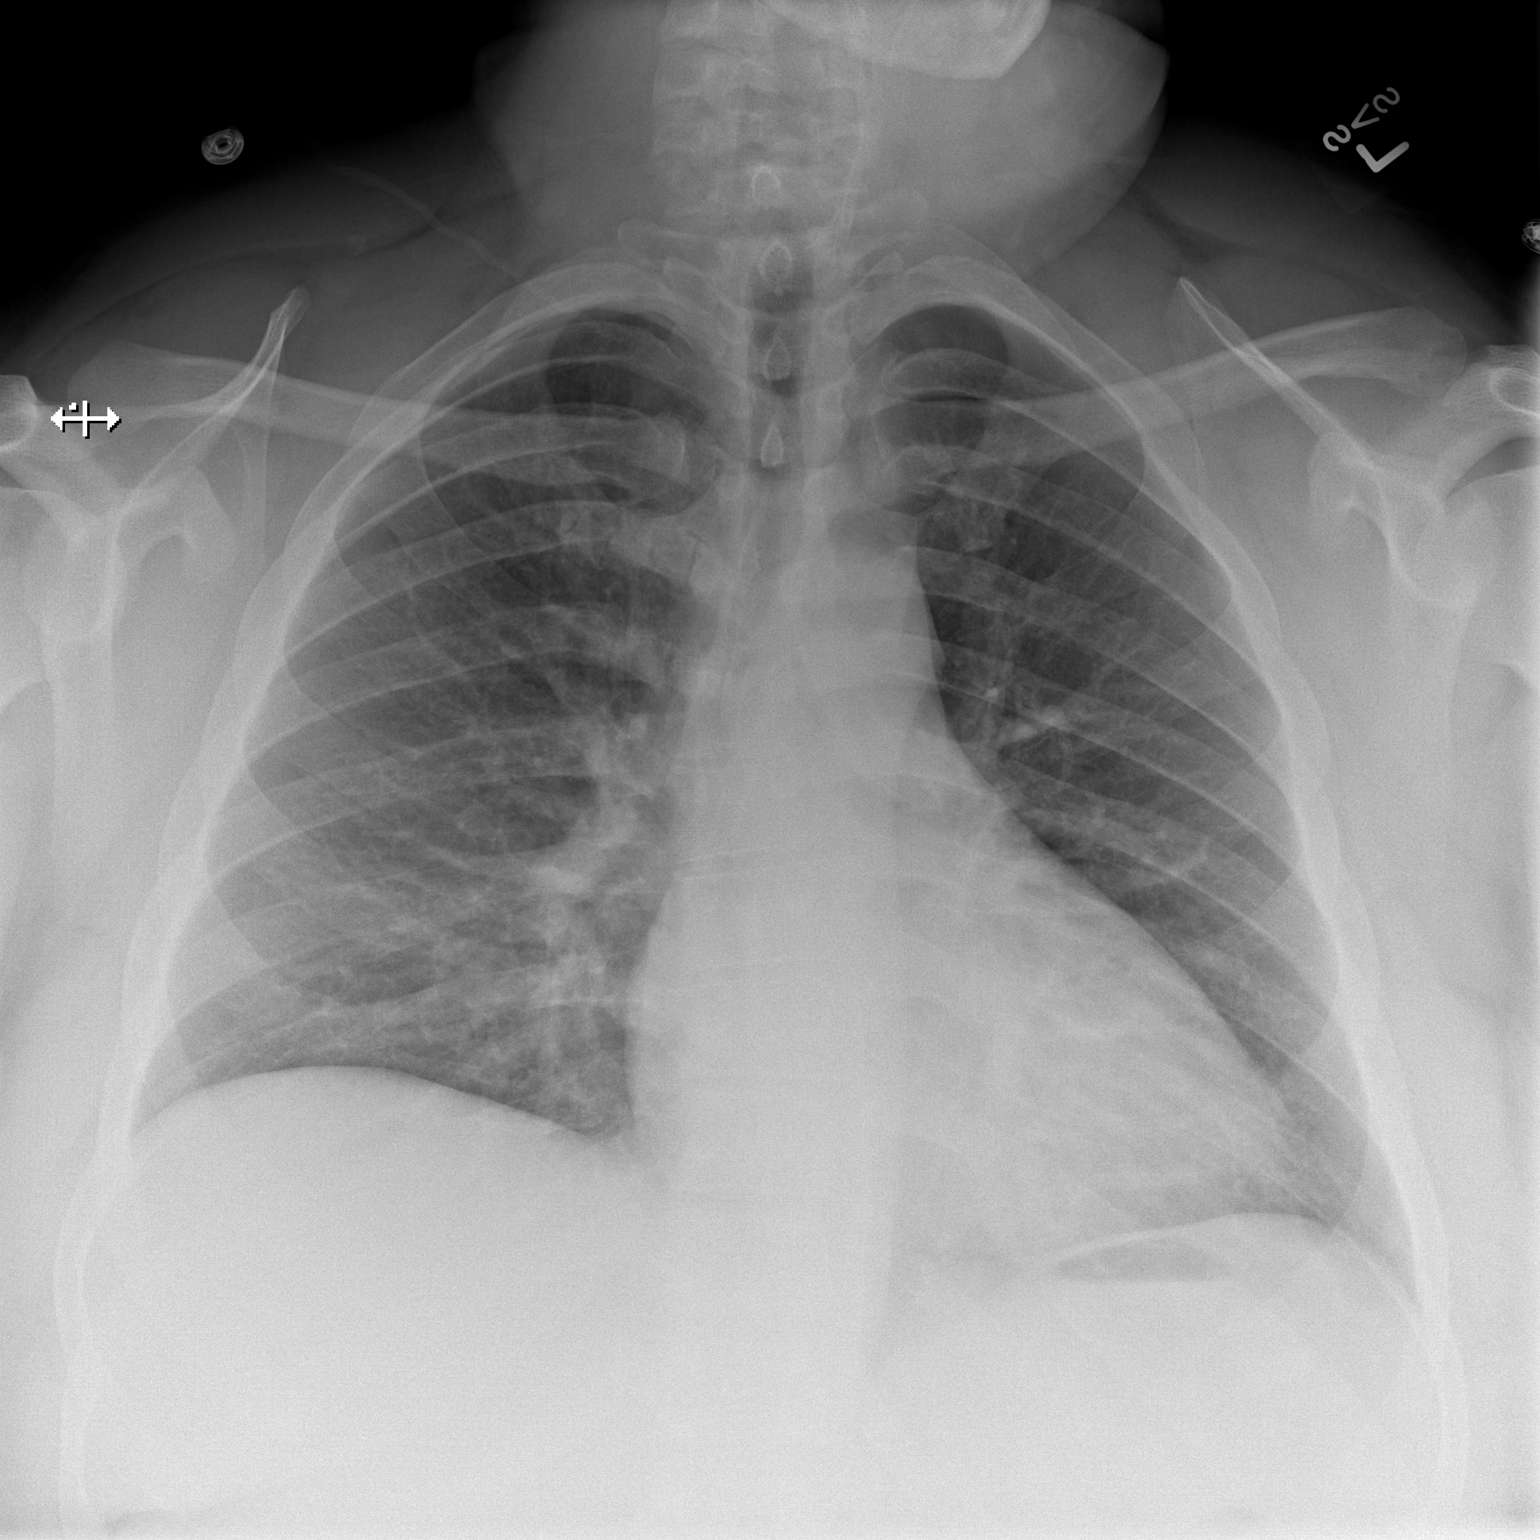

[w chest lat]
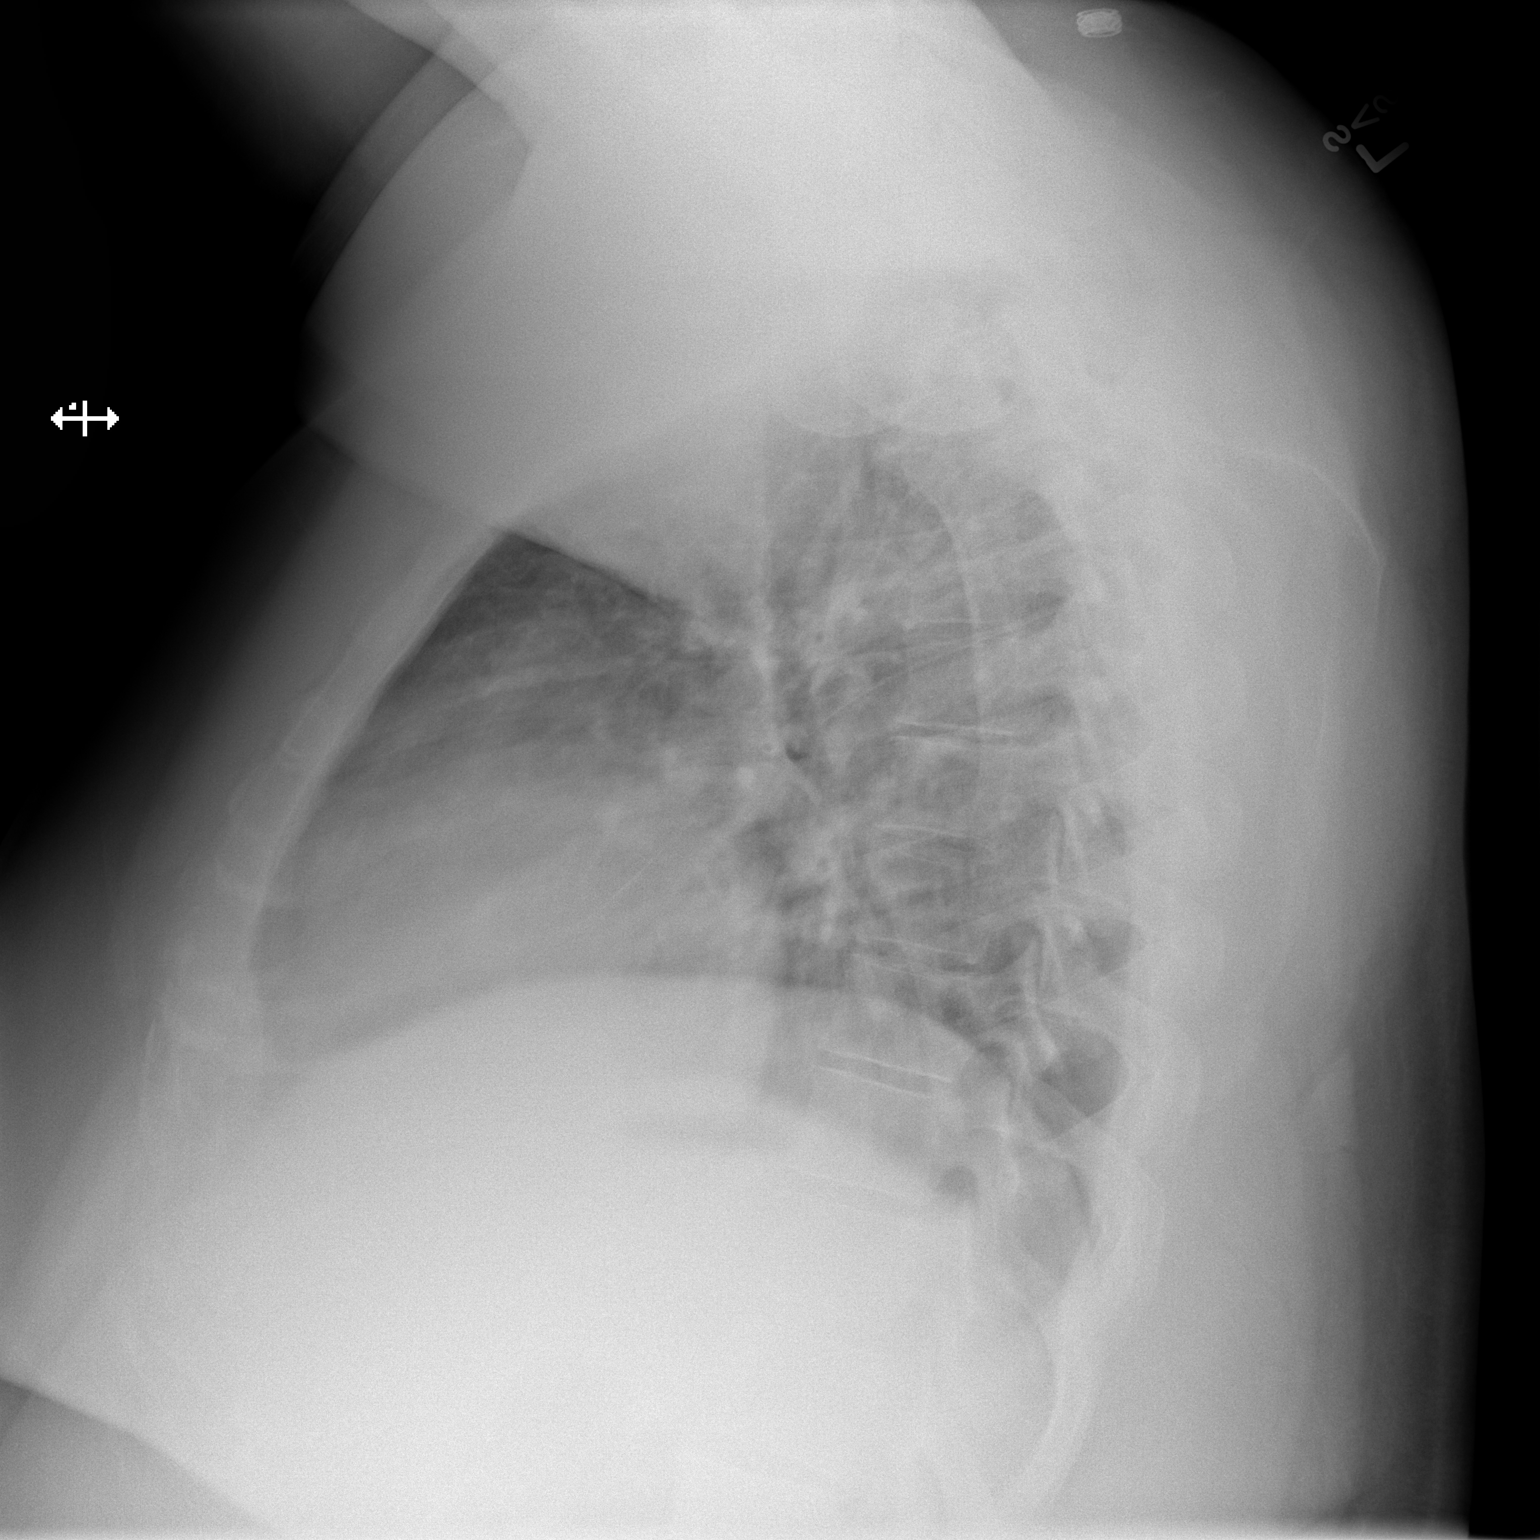

[2 of 2 positions shown; findings below may reference images not displayed]

FINDINGS: The heart size and mediastinal contours are within normal limits.
Both lungs are clear. The visualized skeletal structures are
unremarkable.
IMPRESSION: No active cardiopulmonary disease.

## 2014-04-01 MED ORDER — SODIUM CHLORIDE 0.9 % IV BOLUS (SEPSIS)
1000.0000 mL | Freq: Once | INTRAVENOUS | Status: AC
  Administered 2014-04-01: 1000 mL via INTRAVENOUS

## 2014-04-01 MED ORDER — IBUPROFEN 800 MG PO TABS
800.0000 mg | ORAL_TABLET | Freq: Once | ORAL | Status: AC
  Administered 2014-04-01: 800 mg via ORAL
  Filled 2014-04-01: qty 1

## 2014-04-01 NOTE — ED Notes (Signed)
Patient c/o RUQ abd pain, described as cramping. Patient denies any additional symptoms. Patient c/o feeling cold, low grade temp on arrival to ED. Patient states he went to PCP on Tuesday, was switched from amlodipine and lisinopril to lisinopril and HCTZ. Patient states he was then seen Friday and Saturday at [facility] and was called back stating his WBC count was elevated, and suggested patient come in for further evaluation. Patient states he "just doesn't feel well in general".

## 2014-04-01 NOTE — Discharge Instructions (Signed)
You blood cultures are pending at this time.  If they are positive or require treatment, you will be contacted. Continue all home meds as prescribed. Drink plenty of fluids to keep yourself hydrated, rest. Follow-up with your primary care physician-- will need close monitoring of kidney function. Return to the ED for new concerns.

## 2014-04-01 NOTE — ED Provider Notes (Signed)
CSN: [id number]     Arrival date & time 04/01/14  2036 History   First MD Initiated Contact with Patient 04/01/14 2107     Chief Complaint  Patient presents with  . Abdominal Pain     (Consider location/radiation/quality/duration/timing/severity/associated sxs/prior Treatment) Patient is a 33 y.o. male presenting with abdominal pain. The history is provided by the patient and medical records.  Abdominal Pain Associated symptoms: chills, cough, fatigue and fever    This is a 33 year old male with past medical history significant for hypertension, sleep apnea, GERD, presenting to the ED for generalized "feeling unwell".  Patient states he was seen by his primary care physician on Tuesday and changed from lisinopril 10mg  and amlodipine 10mg , to lisinopril 40mg  and HCTZ.  States he is starting to adjusting his medications. States over the weekend he began feeling "like I had the flu". Specifically he endorsed subjective fever and chills, cough, increased fatigue, and generalized body aches.  States he was seen at urgent care twice over the weekend, had blood work done and was called back earlier today stating his white blood cell count was elevated and that he should be evaluated.  On arrival, pt states flu like sx have persisted.  States earlier today he did have a "cramp" in his right lateral ribs, but denies chest pain or shortness of breath. No prior hx of DVT or PE. He denies any abdominal pain, nausea, vomiting, or diarrhea. He denies known sick contacts.  Low grade temp on arrival-- 100.34F.  Past Medical History  Diagnosis Date  . HTN (hypertension)   . Sleep apnea   . GERD (gastroesophageal reflux disease)    Past Surgical History  Procedure Laterality Date  . Two knee surgeries Left 1995   Family History  Problem Relation Age of Onset  . Hypertension Mother   . Hypertension Father   . Hypertension Brother   . Hyperlipidemia Paternal Grandfather   . Diabetes Maternal Grandmother    . Heart failure Maternal Grandfather   . Heart failure Paternal Grandfather    History  Substance Use Topics  . Smoking status: Current Every Day Smoker -- 0.10 packs/day for 13 years    Types: Cigarettes  . Smokeless tobacco: Not on file  . Alcohol Use: Yes     Comment: occassional    Review of Systems  Constitutional: Positive for fever, chills and fatigue.  Respiratory: Positive for cough.   Musculoskeletal: Positive for myalgias.  All other systems reviewed and are negative.     Allergies  Chlorthalidone  Home Medications   Prior to Admission medications   Medication Sig Start Date End Date Taking? Authorizing Provider  lisinopril (PRINIVIL,ZESTRIL) 40 MG tablet Take 1 tablet (40 mg total) by mouth daily. 03/26/14  Yes Shawnee Knapp, MD  naproxen sodium (ANAPROX) 220 MG tablet Take 440 mg by mouth 2 (two) times daily with a meal.   Yes Historical Provider, MD  varenicline (CHANTIX STARTING MONTH PAK) 0.5 MG X 11 & 1 MG X 42 tablet Take one 0.5 mg tablet by mouth once daily x 3 days, then increase to one 0.5 mg tab bid x 4 days, then increase to one 1 mg bid. 03/26/14  Yes Shawnee Knapp, MD   BP 140/66  Pulse 99  Temp(Src) 100.5 F (38.1 C) (Oral)  Resp 20  SpO2 98%  Physical Exam  Nursing note and vitals reviewed. Constitutional: He is oriented to person, place, and time. He appears well-developed and well-nourished.  Non-toxic  appearance. No distress.  Morbidly obese; Warm to touch, non-toxic appearing  HENT:  Head: Normocephalic and atraumatic.  Right Ear: Tympanic membrane and ear canal normal.  Left Ear: Tympanic membrane and ear canal normal.  Nose: Nose normal.  Mouth/Throat: Uvula is midline, oropharynx is clear and moist and mucous membranes are normal. No oropharyngeal exudate, posterior oropharyngeal edema, posterior oropharyngeal erythema or tonsillar abscesses.  Eyes: Conjunctivae and EOM are normal. Pupils are equal, round, and reactive to light.  Neck:  Normal range of motion. Neck supple.  Cardiovascular: Normal rate, regular rhythm and normal heart sounds.   Pulmonary/Chest: Effort normal and breath sounds normal. No respiratory distress. He has no wheezes. He has no rhonchi.  Right lateral ribs non-tender; no bruising or deformities; no crepitus  Abdominal: Soft. Bowel sounds are normal. There is no tenderness. There is no guarding, no CVA tenderness, no tenderness at McBurney's point and negative Murphy's sign.  Abdomen soft, non-distended, no focal tenderness or peritoneal signs  Musculoskeletal: Normal range of motion.  Neurological: He is alert and oriented to person, place, and time.  Skin: Skin is warm and dry. He is not diaphoretic.  Psychiatric: He has a normal mood and affect.    ED Course  Procedures (including critical care time) Labs Review Labs Reviewed  CBC WITH DIFFERENTIAL - Abnormal; Notable for the following:    WBC 16.1 (*)    Neutrophils Relative % 78 (*)    Neutro Abs 12.6 (*)    Monocytes Absolute 1.1 (*)    All other components within normal limits  COMPREHENSIVE METABOLIC PANEL - Abnormal; Notable for the following:    Glucose, Bld 140 (*)    Creatinine, Ser 1.46 (*)    Total Protein 8.4 (*)    GFR calc non Af Amer 61 (*)    GFR calc Af Amer 71 (*)    All other components within normal limits  URINALYSIS, ROUTINE W REFLEX MICROSCOPIC - Abnormal; Notable for the following:    Color, Urine AMBER (*)    Specific Gravity, Urine 1.031 (*)    Protein, ur 30 (*)    All other components within normal limits  URINE MICROSCOPIC-ADD ON - Abnormal; Notable for the following:    Casts HYALINE CASTS (*)    All other components within normal limits  LIPASE, BLOOD  I-STAT CG4 LACTIC ACID, ED    Imaging Review Dg Chest 2 View  04/01/2014   CLINICAL DATA:  Cough.  Shortness of breath.  Fever.  EXAM: CHEST  2 VIEW  COMPARISON:  07/21/2013  FINDINGS: The heart size and mediastinal contours are within normal limits.  Both lungs are clear. The visualized skeletal structures are unremarkable.  IMPRESSION: No active cardiopulmonary disease.   Electronically Signed   By: Earle Gell M.D.   On: 04/01/2014 22:18     EKG Interpretation None      MDM   Final diagnoses:  Other fatigue  Fever, unspecified fever cause  Cough   33 y.o. M with flu like sx x3 days.  Triage note states abdominal pain, however pain states location of his "cramp" earlier today was in his right rib cage, no trauma noted. On arrival to the ED, patient has low-grade fever of 100.28F but no other SIRS criteria. He is overall nontoxic appearing.  His lungs are clear without audible wheezes or rhonchi, no hypoxia.  Abdominal exam is benign.  Will obtain basic labs, lactic acid, CXR, u/a.  IVFB and motrin given.  Will  reassess.  After fluids, pt states he feels better.  Fever has resolved.  Labs with leukocytosis of 16.1, lactic acid WNL.  SrCr slightly bumped when compared with yesterday, possible component of dehydration and/or increase of lisinopril.  U/a without infection.  CXR clear.  Blood cultures pending.  Pt remains without any abdominal pain, N/V/D and abdominal exam remains benign.  He has been tolerating PO without difficulty, VS remain stable.  Unsure what patient's "cramp" in ribs was from, however no current chest pain or SOB to suggest ACS, PE, dissection, or other acute cardiac event.  Pt will be discharged home with close PCP FU-- encouraged close monitoring of his renal function given increase dose of lisinopril.  Encouraged rest and fluids throughout the day tomorrow.  Advised he will be notified if blood cultures are positive or require treatment.  Discussed plan with patient, he/she acknowledged understanding and agreed with plan of care.  Return precautions given for new or worsening symptoms.  Case discussed with Dr. Betsey Holiday who agrees with assessment and plan of care.  Larene Pickett, PA-C 04/02/14 0040

## 2014-04-01 NOTE — Addendum Note (Signed)
Addended byCandice Camp on: 04/01/2014 08:37 AM   Modules accepted: Orders

## 2014-04-03 NOTE — ED Provider Notes (Signed)
Medical screening examination/treatment/procedure(s) were performed by non-physician practitioner and as supervising physician I was immediately available for consultation/collaboration.   EKG Interpretation None        Orpah Greek, MD 04/03/14 [phone]

## 2014-04-08 ENCOUNTER — Ambulatory Visit (INDEPENDENT_AMBULATORY_CARE_PROVIDER_SITE_OTHER): Payer: 59 | Admitting: Neurology

## 2014-04-08 ENCOUNTER — Encounter: Payer: Self-pay | Admitting: Neurology

## 2014-04-08 VITALS — BP 136/90 | HR 74 | Resp 18 | Ht 74.0 in | Wt >= 6400 oz

## 2014-04-08 DIAGNOSIS — G4710 Hypersomnia, unspecified: Secondary | ICD-10-CM

## 2014-04-08 DIAGNOSIS — G4730 Sleep apnea, unspecified: Secondary | ICD-10-CM

## 2014-04-08 DIAGNOSIS — M2619 Other specified anomalies of jaw-cranial base relationship: Secondary | ICD-10-CM

## 2014-04-08 DIAGNOSIS — E662 Morbid (severe) obesity with alveolar hypoventilation: Secondary | ICD-10-CM

## 2014-04-08 DIAGNOSIS — M2610 Unspecified anomaly of jaw-cranial base relationship: Secondary | ICD-10-CM

## 2014-04-08 DIAGNOSIS — G4733 Obstructive sleep apnea (adult) (pediatric): Secondary | ICD-10-CM

## 2014-04-08 HISTORY — DX: Morbid (severe) obesity with alveolar hypoventilation: E66.2

## 2014-04-08 HISTORY — DX: Obstructive sleep apnea (adult) (pediatric): G47.33

## 2014-04-08 LAB — CULTURE, BLOOD (ROUTINE X 2)
CULTURE: NO GROWTH
Culture: NO GROWTH

## 2014-04-08 NOTE — Patient Instructions (Signed)
Weight     Obesity Obesity is having too much body fat and a body mass index (BMI) of 30 or more. BMI is a number based on your height and weight. The number is an estimate of how much body fat you have. Obesity can happen if you eat more calories than you can burn by exercising or other activity. It can cause major health problems or emergencies.  HOME CARE  Exercise and be active as told by your doctor. Try:  Using stairs when you can.  Parking farther away from store doors.  Gardening, biking, or walking.  Eat healthy foods and drinks that are low in calories. Eat more fruits and vegetables.  Limit fast food, sweets, and snack foods that are made with ingredients that are not natural (processed food).  Eat smaller amounts of food.  Keep a journal and write down what you eat every day. Websites can help with this.  Avoid drinking alcohol. Drink more water and drinks without calories.   Take vitamins and dietary pills (supplements) only as told by your doctor.  Try going to weight-loss support groups or classes to help lessen stress. Dieticians and counselors may also help. GET HELP RIGHT AWAY IF:  You have chest pain or tightness.  You have trouble breathing or feel short of breath.  You feel weak or have loss of feeling (numbness) in your legs.  You feel confused or have trouble talking.  You have sudden changes in your vision. MAKE SURE YOU:  Understand these instructions.  Will watch your condition.  Will get help right away if you are not doing well or get worse. Document Released: 12/06/2011 Document Reviewed: 12/06/2011 [facility] Patient Information 2015 [place]. This information is not intended to replace advice given to you by your health care provider. Make sure you discuss any questions you have with your health care provider. Exercise to Lose Weight Exercise and a healthy diet may help you lose weight. Your doctor may suggest specific  exercises. EXERCISE IDEAS AND TIPS  Choose low-cost things you enjoy doing, such as walking, bicycling, or exercising to workout videos.  Take stairs instead of the elevator.  Walk during your lunch break.  Park your car further away from work or school.  Go to a gym or an exercise class.  Start with 5 to 10 minutes of exercise each day. Build up to 30 minutes of exercise 4 to 6 days a week.  Wear shoes with good support and comfortable clothes.  Stretch before and after working out.  Work out until you breathe harder and your heart beats faster.  Drink extra water when you exercise.  Do not do so much that you hurt yourself, feel dizzy, or get very short of breath. Exercises that burn about 150 calories:  Running 1  miles in 15 minutes.  Playing volleyball for 45 to 60 minutes.  Washing and waxing a car for 45 to 60 minutes.  Playing touch football for 45 minutes.  Walking 1  miles in 35 minutes.  Pushing a stroller 1  miles in 30 minutes.  Playing basketball for 30 minutes.  Raking leaves for 30 minutes.  Bicycling 5 miles in 30 minutes.  Walking 2 miles in 30 minutes.  Dancing for 30 minutes.  Shoveling snow for 15 minutes.  Swimming laps for 20 minutes.  Walking up stairs for 15 minutes.  Bicycling 4 miles in 15 minutes.  Gardening for 30 to 45 minutes.  Jumping rope for 15  minutes.  Washing windows or floors for 45 to 60 minutes. Document Released: 10/16/2010 Document Revised: 12/06/2011 Document Reviewed: 10/16/2010 [facility] Patient Information 2015 [place], [place]. This information is not intended to replace advice given to you by your health care provider. Make sure you discuss any questions you have with your health care provider.

## 2014-04-08 NOTE — Progress Notes (Signed)
[facility]  Provider:  Larey Seat, M D  Referring Provider: Shawnee Knapp, MD Primary Care Physician:  Delman Cheadle, MD  Chief Complaint  Patient presents with  . New Evaluation    Room 10  . Sleep consult    HPI:  Calvin Bailey is a 33 y.o. afro-american male, right handed, who  is seen here as a referral  from Dr. Delman Cheadle for a sleep consultation.   Calvin Bailey reports that he is well aware of his high risk for having sleep apnea and that he was diagnosed  with OSA  8 years ago , at the [place] in 2007 . At the time he was titrated to 15 cm CPAP, but he didn't have insurance that would cover his  CPAP expenses and so he couldn't follow on the recommendations.  He also has a history of hypertension treated on lisinopril and Norvasc, Norvasc caused him to half ankle edema. He continues to smoke about half a pack per day which is a cutback from previously one and a half packs per day. And he smokes only when doing old poor work. He has also nicotine gum to help to reduce her craving. Calvin Bailey reports that he does not have the CPAP for the above-named reasons but he desires to be on treatment, as  he believes that he needs to be reevaluated for sleep apnea-because he has gained weight and has also had more shortness of breath and occasional wheezing.   Weight has changed as well. His BMI is over 40 and he does have a neck circumference of all the 18 inches.  I was able to review his metabolic panel from March 26 2014  . He is not diabetic.   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  He is very tired when he returns from work,  He naps from 6-9 PM -sometimes shorter. He skips dinner often  , returning to bed around 11 PM and sleeps for 4-5 hours with nocturia breaks.  If he doesn't nap , he goes to bed by 10 PM and is immediately asleep. He has  woken up from his snoring, but this happens in the car or in a recliner, but not when  sleeping in his bed. His friends have told him he snores very loud, and that he seems to choke in his sleep.  He lives alone. He sleeps usually alone.   He uses 2 alarms in AM to rise in time, he drinks no caffeine of any kind.  He smokes less, and he has gained weight , but sees no correlation. He doesn't exercise.   He has visible retrognathia and a full face beard. He has no nasal congestion or airflow restriction, no surgeries or traumatic injuries to the facial bones or neck.  No tonsillectomy. His mother may have had sleep apnea.   PS:  He patient fell asleep 3 times in the first 10 minutes of his visit at 9 AM with me today, 04-08-14.    Review of Systems: Out of a complete 14 system review, the patient complains of only the following symptoms, and all other reviewed systems are negative. The patient has been sleepy, Epworth 18, FSS 44 .   His 2007 study revealed an AHI of 101!  History   Social History  . Marital Status:  Single    Spouse Name: N/A    Number of Children: 1  . Years of Education: college   Occupational History  . unemployment   .     Social History Main Topics  . Smoking status: Current Every Day Smoker -- 0.10 packs/day for 13 years    Types: Cigarettes  . Smokeless tobacco: Never Used  . Alcohol Use: Yes     Comment: occassional  . Drug Use: No  . Sexual Activity: Yes    Partners: Female   Other Topics Concern  . Not on file   Social History Narrative   Patient is single and lives alone.   Patient has one child.   Patient works at [employer].   Patient has a college education.   Patient is left-handed.   Patient does not drink any caffeine.    Family History  Problem Relation Age of Onset  . Hypertension Mother   . Hypertension Father   . Hypertension Brother   . Hyperlipidemia Paternal Grandfather   . Diabetes Maternal Grandmother   . Heart  failure Maternal Grandfather   . Heart failure Paternal Grandfather   . Multiple sclerosis Mother     Past Medical History  Diagnosis Date  . HTN (hypertension)   . Sleep apnea   . GERD (gastroesophageal reflux disease)     Past Surgical History  Procedure Laterality Date  . Two knee surgeries Left 1995    Current Outpatient Prescriptions  Medication Sig Dispense Refill  . lisinopril (PRINIVIL,ZESTRIL) 40 MG tablet Take 1 tablet (40 mg total) by mouth daily.  30 tablet  0  . varenicline (CHANTIX STARTING MONTH PAK) 0.5 MG X 11 & 1 MG X 42 tablet Take one 0.5 mg tablet by mouth once daily x 3 days, then increase to one 0.5 mg tab bid x 4 days, then increase to one 1 mg bid.  53 tablet  0  . [DISCONTINUED] LISINOPRIL-HYDROCHLOROTHIAZIDE PO Take by mouth.       No current facility-administered medications for this visit.    Allergies as of 04/08/2014 - Review Complete 04/08/2014  Allergen Reaction Noted  . Chlorthalidone Nausea Only 03/31/2014    Vitals: BP 136/90  Pulse 74  Resp 18  Ht 6\' 2"  (1.88 m)  Wt 400 lb (181.439 kg)  BMI 51.34 kg/m2 Last Weight:  Wt Readings from Last 1 Encounters:  04/08/14 400 lb (181.439 kg)   Last Height:   Ht Readings from Last 1 Encounters:  04/08/14 6\' 2"  (1.88 m)    Physical exam:  General: The patient is awake, alert and appears not in acute distress. The patient is well groomed. Head: Normocephalic, atraumatic. Neck is supple. Mallampati 4 , neck circumference:20, retrognathia. Dental state is crowded and  irregular.  Cardiovascular:  Regular rate and rhythm , without  murmurs or carotid bruit, and without distended neck veins. Respiratory: Lungs are clear to auscultation. Skin:  Without evidence of edema, or rash Trunk: BMI is severly elevated .  The  patient has normal posture.  Neurologic exam : The patient is awake and alert, oriented to place and time.  Memory subjective described as intact. . Mood and affect are  appropriate.  Cranial nerves: Pupils are equal and briskly reactive to light.  Funduscopic exam without  evidence of pallor or edema. Extraocular movements  in vertical and horizontal planes intact and without nystagmus. Visual fields by finger perimetry are intact. Hearing to finger rub intact.  Facial sensation intact to  fine touch. Facial motor strength is symmetric and tongue moves midline. Uvula barely visible.   Motor exam:  Normal tone and normal muscle bulk and symmetric normal strength in all extremities.  Sensory:  Fine touch, pinprick and vibration were normal -Proprioception is  normal.  Coordination: Rapid alternating movements in the fingers/hands is tested and normal.  Finger-to-nose maneuver tested and normal without evidence of ataxia, dysmetria or tremor.  Gait and station: Patient walks without assistive device and is able and assisted stool climb up to the exam table.  Strength within normal limits. Stance is stable and normal. Tandem gait is unfragmented. Romberg testing is normal.  Deep tendon reflexes: in the  upper and lower extremities are symmetric and intact. Babinski maneuver  downgoing.   Assessment:  After physical and neurologic examination, review of laboratory studies, imaging, neurophysiology testing and pre-existing records, assessment is   1)major Risk factors for Sleep apnea  are the high BMI and mallompatti and his neck circumference with retrognathia.  He should not use a FFM if in any way avoidable.  He had an AHI of 101 in 2007, and weight gain and continued smoking have likely made this worse. He has witnesses to his apnea and snoring.   Plan:  Treatment plan and additional workup : This patient needs ASAP SPLIT study with titration.  should arrive as the first patient of the night. CO2 needed.  Smoking cessation referral Weight loss information.  Discussed risks of untreated sleep apnea  (associated with CAD , atrial fibrillation and embolic  strokes) with the patient .

## 2014-04-12 ENCOUNTER — Ambulatory Visit: Payer: Self-pay | Admitting: Family Medicine

## 2014-05-05 ENCOUNTER — Ambulatory Visit (INDEPENDENT_AMBULATORY_CARE_PROVIDER_SITE_OTHER): Payer: 59 | Admitting: Emergency Medicine

## 2014-05-05 VITALS — BP 177/104 | HR 90 | Temp 98.5°F | Resp 16 | Ht 73.0 in | Wt 387.4 lb

## 2014-05-05 DIAGNOSIS — E291 Testicular hypofunction: Secondary | ICD-10-CM

## 2014-05-05 DIAGNOSIS — N521 Erectile dysfunction due to diseases classified elsewhere: Secondary | ICD-10-CM

## 2014-05-05 DIAGNOSIS — N529 Male erectile dysfunction, unspecified: Secondary | ICD-10-CM

## 2014-05-05 DIAGNOSIS — I10 Essential (primary) hypertension: Secondary | ICD-10-CM

## 2014-05-05 MED ORDER — VARDENAFIL HCL 20 MG PO TABS: 20.0000 mg | ORAL_TABLET | Freq: Every day | ORAL | Status: DC | PRN

## 2014-05-05 MED ORDER — LISINOPRIL 40 MG PO TABS: 40.0000 mg | ORAL_TABLET | Freq: Every day | ORAL | Status: DC

## 2014-05-05 MED ORDER — TESTOSTERONE 20.25 MG/1.25GM (1.62%) TD GEL: TRANSDERMAL | Status: DC

## 2014-05-05 NOTE — Patient Instructions (Signed)

## 2014-05-05 NOTE — Progress Notes (Signed)
[facility] [address] [place] [address] [phone]  Date:  05/05/2014   Name:  Calvin Bailey   DOB:  [birth year]   MRN:  [id number]  PCP:  Delman Cheadle, MD    Chief Complaint: Medication Refill   History of Present Illness:  Calvin Bailey is a 33 y.o. very pleasant male patient who presents with the following:  History of hypertension and has been out of medication for a week. Now has headaches.  No chest pain or shortness of breath. No neuro or visual symptoms. Recently developed ED and has been unsuccessfull in achieving an adequate erection. Previously documented and treated hypogonadism. Lost insurance so stopped the androgel No improvement with over the counter medications or other home remedies. Denies other complaint or health concern today.   Patient Active Problem List   Diagnosis Date Noted  . Obesity hypoventilation syndrome 04/08/2014  . Obesity, morbid 04/08/2014  . OSA (obstructive sleep apnea) 04/08/2014  . Tobacco use disorder 02/23/2011  . Sleep apnea 02/09/2011  . GERD (gastroesophageal reflux disease) 02/09/2011  . Obese 02/09/2011  . HEMOPTYSIS UNSPECIFIED 03/25/2010  . HYPERTENSION 07/21/2009  . HEADACHE 07/21/2009    Past Medical History  Diagnosis Date  . HTN (hypertension)   . Sleep apnea   . GERD (gastroesophageal reflux disease)   . Obesity hypoventilation syndrome 04/08/2014  . OSA (obstructive sleep apnea) 04/08/2014    Past Surgical History  Procedure Laterality Date  . Two knee surgeries Left 1995    History  Substance Use Topics  . Smoking status: Current Every Day Smoker -- 0.10 packs/day for 13 years    Types: Cigarettes  . Smokeless tobacco: Never Used  . Alcohol Use: Yes     Comment: occassional    Family History  Problem Relation Age of Onset  . Hypertension Mother   . Hypertension Father   . Hypertension Brother   . Hyperlipidemia Paternal Grandfather   . Diabetes Maternal Grandmother    . Heart failure Maternal Grandfather   . Heart failure Paternal Grandfather   . Multiple sclerosis Mother     Allergies  Allergen Reactions  . Chlorthalidone Nausea Only    Dizzy, fatigue, myalgias    Medication list has been reviewed and updated.  Current Outpatient Prescriptions on File Prior to Visit  Medication Sig Dispense Refill  . lisinopril (PRINIVIL,ZESTRIL) 40 MG tablet Take 1 tablet (40 mg total) by mouth daily.  30 tablet  0  . varenicline (CHANTIX STARTING MONTH PAK) 0.5 MG X 11 & 1 MG X 42 tablet Take one 0.5 mg tablet by mouth once daily x 3 days, then increase to one 0.5 mg tab bid x 4 days, then increase to one 1 mg bid.  53 tablet  0  . [DISCONTINUED] LISINOPRIL-HYDROCHLOROTHIAZIDE PO Take by mouth.       No current facility-administered medications on file prior to visit.    Review of Systems:  As per HPI, otherwise negative.    Physical Examination: Filed Vitals:   05/05/14 1121  BP: 177/104  Pulse: 90  Temp: 98.5 F (36.9 C)  Resp: 16   Filed Vitals:   05/05/14 1121  Height: 6\' 1"  (1.854 m)  Weight: 387 lb 6.4 oz (175.723 kg)   Body mass index is 51.12 kg/(m^2). Ideal Body Weight: Weight in (lb) to have BMI = 25: 189.1   GEN: morbid obesity, NAD, Non-toxic, Alert & Oriented x 3 HEENT: Atraumatic, Normocephalic.  Ears and Nose:  No external deformity. EXTR: No clubbing/cyanosis/edema NEURO: Normal gait.  PSYCH: Normally interactive. Conversant. Not depressed or anxious appearing.  Calm demeanor.    Assessment and Plan: Hypertension Morbid obesity ED Refill meds levitra  Signed,  Ellison Carwin, MD

## 2014-05-06 ENCOUNTER — Encounter (HOSPITAL_COMMUNITY): Payer: Self-pay | Admitting: Emergency Medicine

## 2014-05-06 ENCOUNTER — Emergency Department (HOSPITAL_COMMUNITY)
Admission: EM | Admit: 2014-05-06 | Discharge: 2014-05-07 | Disposition: A | Discharge status: Home / Self Care | Payer: 59 | Attending: Emergency Medicine | Admitting: Emergency Medicine

## 2014-05-06 DIAGNOSIS — Z8719 Personal history of other diseases of the digestive system: Secondary | ICD-10-CM | POA: Diagnosis not present

## 2014-05-06 DIAGNOSIS — I10 Essential (primary) hypertension: Secondary | ICD-10-CM | POA: Diagnosis not present

## 2014-05-06 DIAGNOSIS — Z8669 Personal history of other diseases of the nervous system and sense organs: Secondary | ICD-10-CM | POA: Insufficient documentation

## 2014-05-06 DIAGNOSIS — Z79899 Other long term (current) drug therapy: Secondary | ICD-10-CM | POA: Insufficient documentation

## 2014-05-06 DIAGNOSIS — F17200 Nicotine dependence, unspecified, uncomplicated: Secondary | ICD-10-CM | POA: Diagnosis not present

## 2014-05-06 DIAGNOSIS — E662 Morbid (severe) obesity with alveolar hypoventilation: Secondary | ICD-10-CM | POA: Diagnosis not present

## 2014-05-06 DIAGNOSIS — R609 Edema, unspecified: Secondary | ICD-10-CM | POA: Diagnosis not present

## 2014-05-06 NOTE — ED Notes (Signed)
Pt complains of high blood pressure and concerned that his nose will start bleeding like it has before, no other complaints

## 2014-05-07 NOTE — ED Provider Notes (Signed)
CSN: [id number]     Arrival date & time 05/06/14  2315 History   First MD Initiated Contact with Patient 05/07/14 0028     Chief Complaint  Patient presents with  . Hypertension    HPI Pt has history of high blood pressure.  He ran out of his medications for 3-4 days.  He started taking them again 2 days ago.  This evening he checked his blood pressure and it was in the 190s over 100s.  He took it a couple of times and it remained elevated.  He started having a nosebleed as well.  The nosebleed stopped but he was concerned about the blood pressure.  No chest pain or shortness of breath.   Otherwise he is feeling well. Past Medical History  Diagnosis Date  . HTN (hypertension)   . Sleep apnea   . GERD (gastroesophageal reflux disease)   . Obesity hypoventilation syndrome 04/08/2014  . OSA (obstructive sleep apnea) 04/08/2014   Past Surgical History  Procedure Laterality Date  . Two knee surgeries Left 1995   Family History  Problem Relation Age of Onset  . Hypertension Mother   . Hypertension Father   . Hypertension Brother   . Hyperlipidemia Paternal Grandfather   . Diabetes Maternal Grandmother   . Heart failure Maternal Grandfather   . Heart failure Paternal Grandfather   . Multiple sclerosis Mother    History  Substance Use Topics  . Smoking status: Current Every Day Smoker -- 0.10 packs/day for 13 years    Types: Cigarettes  . Smokeless tobacco: Never Used  . Alcohol Use: Yes     Comment: occassional    Review of Systems  Respiratory: Negative for shortness of breath.   Cardiovascular: Negative for chest pain.  All other systems reviewed and are negative.     Allergies  Review of patient's allergies indicates no active allergies.  Home Medications   Prior to Admission medications   Medication Sig Start Date End Date Taking? Authorizing Provider  chlorthalidone (HYGROTON) 25 MG tablet Take 25 mg by mouth daily.   Yes Historical Provider, MD  lisinopril  (PRINIVIL,ZESTRIL) 40 MG tablet Take 1 tablet (40 mg total) by mouth daily. 05/05/14  Yes Ellison Carwin, MD  Testosterone (ANDROGEL) 20.25 MG/1.25GM (1.62%) GEL Two pumps on each shoulder daily 05/05/14  Yes Ellison Carwin, MD  varenicline (CHANTIX) 1 MG tablet Take 1 mg by mouth 2 (two) times daily.   Yes Historical Provider, MD   BP 170/87  Pulse 90  Temp(Src) 98.2 F (36.8 C) (Oral)  Resp 24  SpO2 100% Physical Exam  Nursing note and vitals reviewed. Constitutional: He appears well-developed and well-nourished. No distress.  Morbidly obese   HENT:  Head: Normocephalic and atraumatic.  Right Ear: External ear normal.  Left Ear: External ear normal.  Nose: No nasal septal hematoma. No epistaxis.  Eyes: Conjunctivae are normal. Right eye exhibits no discharge. Left eye exhibits no discharge. No scleral icterus.  Neck: Neck supple. No tracheal deviation present.  Cardiovascular: Normal rate, regular rhythm and intact distal pulses.   Pulmonary/Chest: Effort normal and breath sounds normal. No stridor. No respiratory distress. He has no wheezes. He has no rales.  Abdominal: Soft. Bowel sounds are normal. He exhibits no distension. There is no tenderness. There is no rebound and no guarding.  Musculoskeletal: He exhibits no tenderness. Edema: trace edema ankles.  Neurological: He is alert. He has normal strength. No cranial nerve deficit (no facial droop, extraocular movements intact,  no slurred speech) or sensory deficit. He exhibits normal muscle tone. He displays no seizure activity. Coordination normal.  Skin: Skin is warm and dry. No rash noted.  Psychiatric: He has a normal mood and affect.    ED Course  Procedures   MDM   Final diagnoses:  Essential hypertension    Blood pressure during my exam was 147/52.  Pt without any complaints of chest pain or shortness of breath.  Does have history of htn and restarted his medications recently.  Discussed outpatient follow  up.  At this time there does not appear to be any evidence of an acute emergency medical condition and the patient appears stable for discharge with appropriate outpatient follow up.     Dorie Rank, MD 05/07/14 [id number]

## 2014-05-07 NOTE — Discharge Instructions (Signed)

## 2014-05-23 ENCOUNTER — Ambulatory Visit (INDEPENDENT_AMBULATORY_CARE_PROVIDER_SITE_OTHER): Payer: 59 | Admitting: Neurology

## 2014-05-23 DIAGNOSIS — G4730 Sleep apnea, unspecified: Secondary | ICD-10-CM

## 2014-05-23 DIAGNOSIS — G4710 Hypersomnia, unspecified: Secondary | ICD-10-CM

## 2014-05-23 DIAGNOSIS — E662 Morbid (severe) obesity with alveolar hypoventilation: Secondary | ICD-10-CM

## 2014-05-23 DIAGNOSIS — M2619 Other specified anomalies of jaw-cranial base relationship: Secondary | ICD-10-CM

## 2014-05-23 DIAGNOSIS — G4733 Obstructive sleep apnea (adult) (pediatric): Secondary | ICD-10-CM

## 2014-06-05 ENCOUNTER — Telehealth: Payer: Self-pay | Admitting: *Deleted

## 2014-06-05 NOTE — Telephone Encounter (Signed)
Called and spoke with pt regarding his split night study.  Pt was instructed that the test revealed a severe case of OSA.  Pt informed that the MD had recommended the use of CPAP therapy at a setting of 12 cm with use of a Respironics Nuance Pro with Large nasal pillows.  Pt was informed that a copy of the results will be mailed to him and that the referring MD, Delman Cheadle would be provided results.  Pt informed to contact our office with any questions.  Patient to be set up with [facility].

## 2014-06-08 ENCOUNTER — Encounter (HOSPITAL_COMMUNITY): Payer: Self-pay | Admitting: Emergency Medicine

## 2014-06-08 ENCOUNTER — Emergency Department (HOSPITAL_COMMUNITY)
Admission: EM | Admit: 2014-06-08 | Discharge: 2014-06-09 | Disposition: A | Discharge status: Home / Self Care | Payer: 59 | Attending: Emergency Medicine | Admitting: Emergency Medicine

## 2014-06-08 ENCOUNTER — Emergency Department (HOSPITAL_COMMUNITY): Payer: 59

## 2014-06-08 DIAGNOSIS — F17200 Nicotine dependence, unspecified, uncomplicated: Secondary | ICD-10-CM | POA: Insufficient documentation

## 2014-06-08 DIAGNOSIS — Z79899 Other long term (current) drug therapy: Secondary | ICD-10-CM | POA: Insufficient documentation

## 2014-06-08 DIAGNOSIS — N453 Epididymo-orchitis: Secondary | ICD-10-CM | POA: Insufficient documentation

## 2014-06-08 DIAGNOSIS — Z8669 Personal history of other diseases of the nervous system and sense organs: Secondary | ICD-10-CM | POA: Diagnosis not present

## 2014-06-08 DIAGNOSIS — I10 Essential (primary) hypertension: Secondary | ICD-10-CM | POA: Insufficient documentation

## 2014-06-08 DIAGNOSIS — R1900 Intra-abdominal and pelvic swelling, mass and lump, unspecified site: Secondary | ICD-10-CM | POA: Insufficient documentation

## 2014-06-08 DIAGNOSIS — E669 Obesity, unspecified: Secondary | ICD-10-CM | POA: Diagnosis not present

## 2014-06-08 DIAGNOSIS — N451 Epididymitis: Secondary | ICD-10-CM

## 2014-06-08 LAB — CBC WITH DIFFERENTIAL/PLATELET
Basophils Absolute: 0 10*3/uL (ref 0.0–0.1)
Basophils Relative: 0 % (ref 0–1)
EOS ABS: 0.3 10*3/uL (ref 0.0–0.7)
EOS PCT: 3 % (ref 0–5)
HEMATOCRIT: 39.9 % (ref 39.0–52.0)
Hemoglobin: 13.7 g/dL (ref 13.0–17.0)
LYMPHS ABS: 2.6 10*3/uL (ref 0.7–4.0)
LYMPHS PCT: 23 % (ref 12–46)
MCH: 29 pg (ref 26.0–34.0)
MCHC: 34.3 g/dL (ref 30.0–36.0)
MCV: 84.4 fL (ref 78.0–100.0)
MONO ABS: 0.9 10*3/uL (ref 0.1–1.0)
Monocytes Relative: 8 % (ref 3–12)
Neutro Abs: 7.7 10*3/uL (ref 1.7–7.7)
Neutrophils Relative %: 66 % (ref 43–77)
Platelets: 300 10*3/uL (ref 150–400)
RBC: 4.73 MIL/uL (ref 4.22–5.81)
RDW: 12.6 % (ref 11.5–15.5)
WBC: 11.5 10*3/uL — AB (ref 4.0–10.5)

## 2014-06-08 LAB — URINALYSIS, ROUTINE W REFLEX MICROSCOPIC
BILIRUBIN URINE: NEGATIVE
GLUCOSE, UA: NEGATIVE mg/dL
Hgb urine dipstick: NEGATIVE
KETONES UR: NEGATIVE mg/dL
Leukocytes, UA: NEGATIVE
Nitrite: NEGATIVE
PH: 6 (ref 5.0–8.0)
Protein, ur: NEGATIVE mg/dL
Specific Gravity, Urine: 1.026 (ref 1.005–1.030)
Urobilinogen, UA: 0.2 mg/dL (ref 0.0–1.0)

## 2014-06-08 LAB — BASIC METABOLIC PANEL
Anion gap: 12 (ref 5–15)
BUN: 18 mg/dL (ref 6–23)
CALCIUM: 9.1 mg/dL (ref 8.4–10.5)
CO2: 25 meq/L (ref 19–32)
CREATININE: 1.13 mg/dL (ref 0.50–1.35)
Chloride: 101 mEq/L (ref 96–112)
GFR calc Af Amer: >90 mL/min (ref >90)
GFR calc non Af Amer: 83 mL/min — ABNORMAL LOW (ref >90)
GLUCOSE: 91 mg/dL (ref 70–99)
Potassium: 4.3 mEq/L (ref 3.7–5.3)
Sodium: 138 mEq/L (ref 137–147)

## 2014-06-08 IMAGING — US US SCROTUM
1 series · 14 of 25 positions shown · non-contrast
Comparison: None.

CLINICAL DATA: Right-sided scrotal pain.

EXAM:
SCROTAL ULTRASOUND
DOPPLER ULTRASOUND OF THE TESTICLES
TECHNIQUE: Complete ultrasound examination of the testicles, epididymis, and
other scrotal structures was performed. Color and spectral Doppler
ultrasound were also utilized to evaluate blood flow to the
testicles.

[Series 1: us scrotum · 0.06mm/px · 14 of 42 slices shown]
[im 1/42]
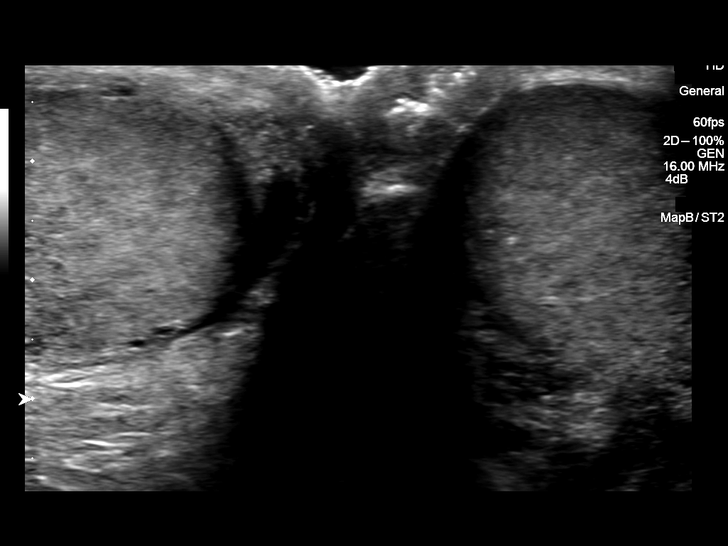
[im 4/42]
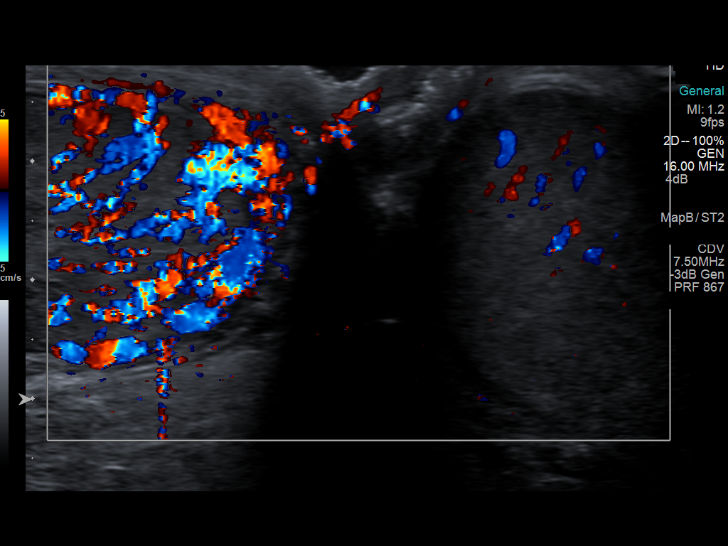
[im 7/42]
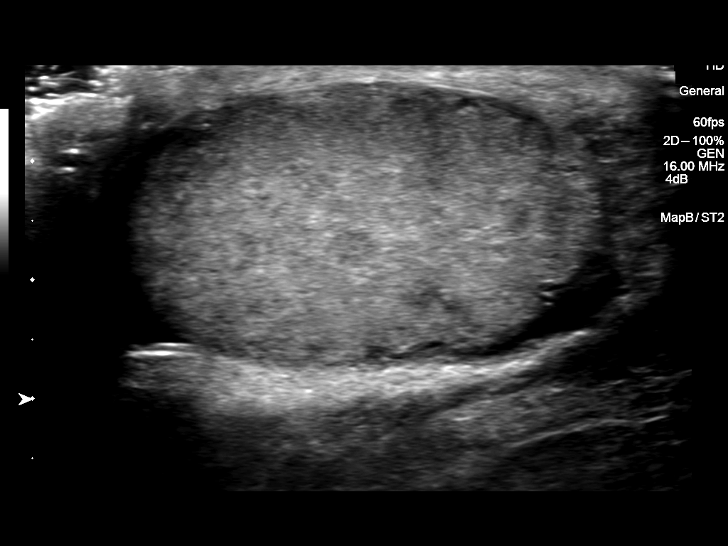
[im 11/42]
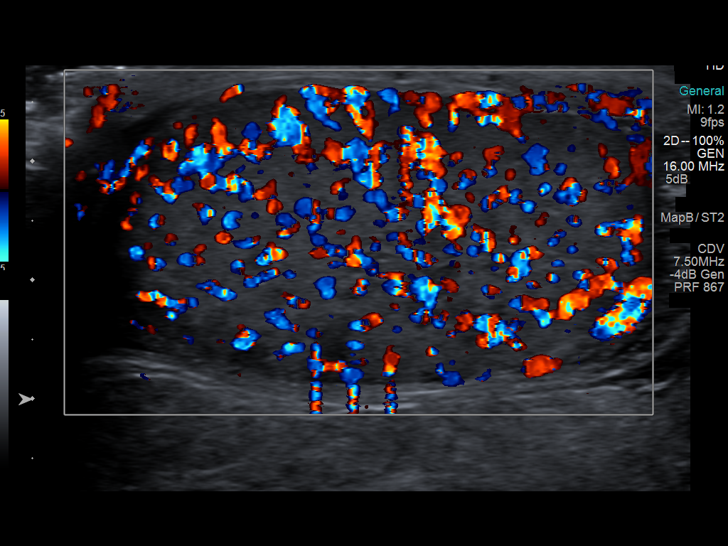
[im 14/42]
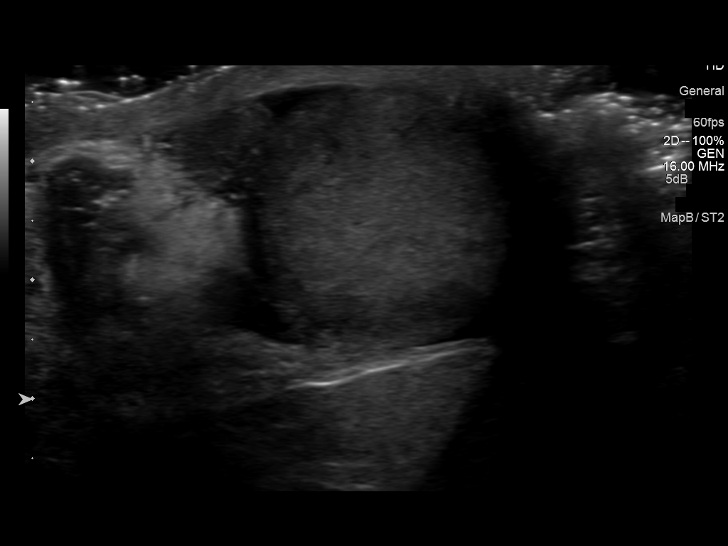
[im 16/42]
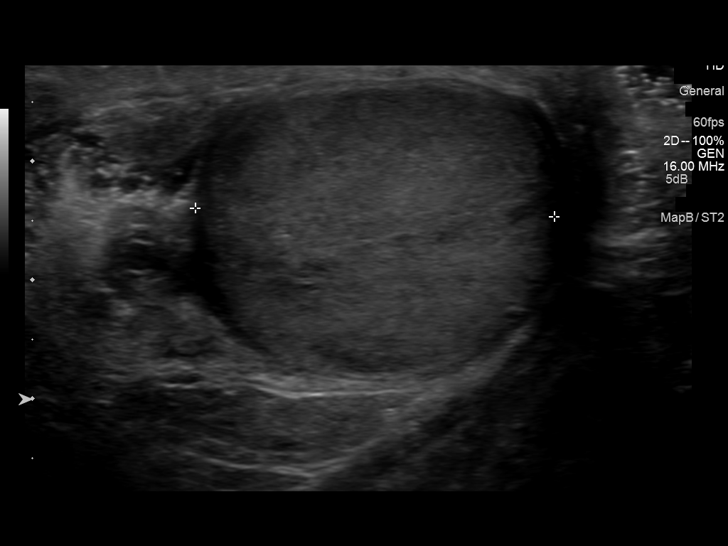
[im 19/42]
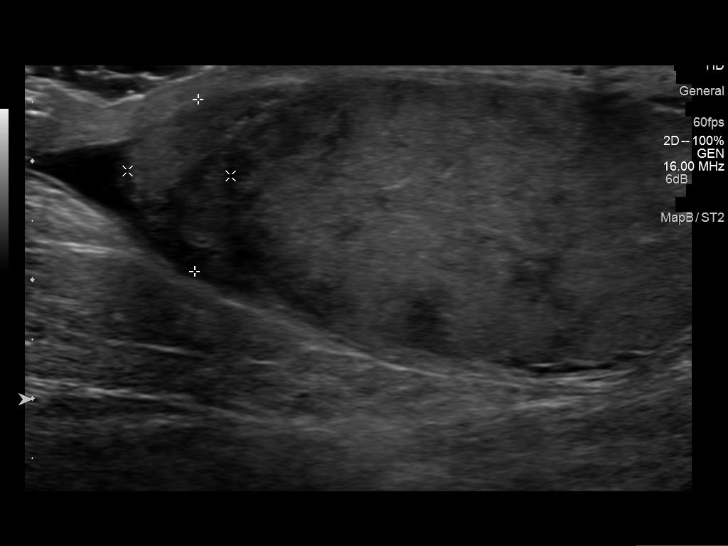
[im 23/42]
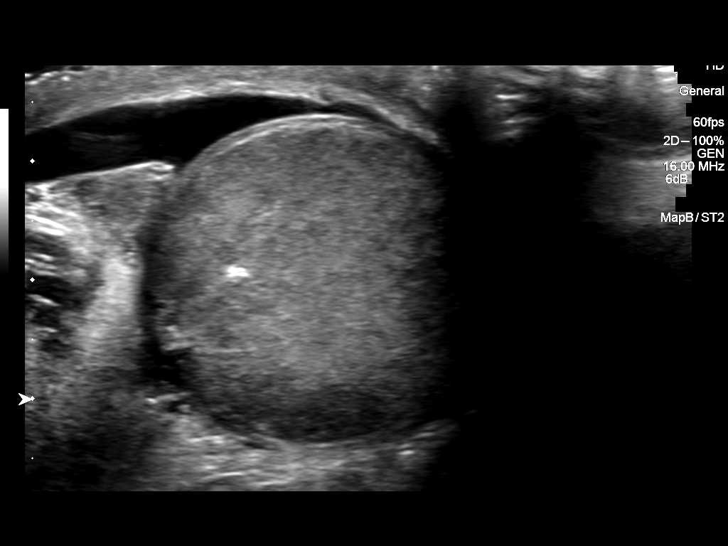
[im 26/42]
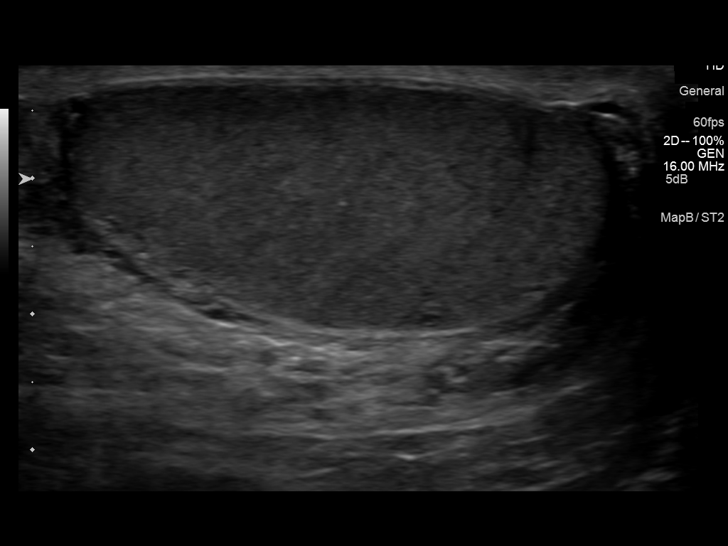
[im 28/42]
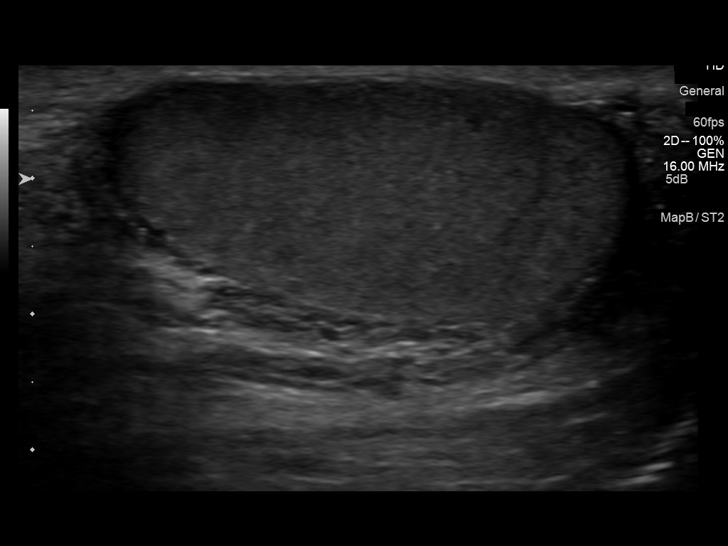
[im 31/42]
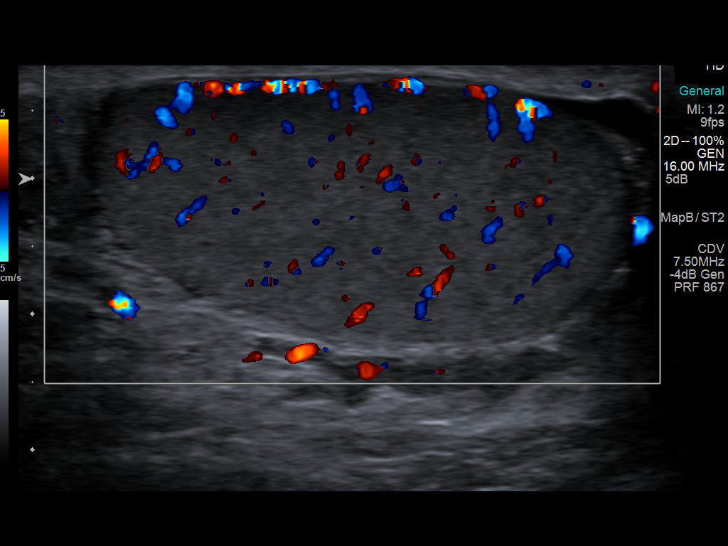
[im 35/42]
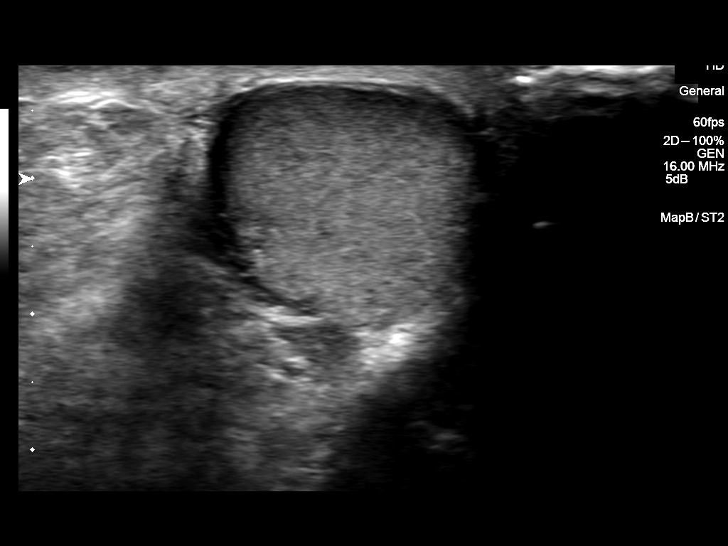
[im 38/42]
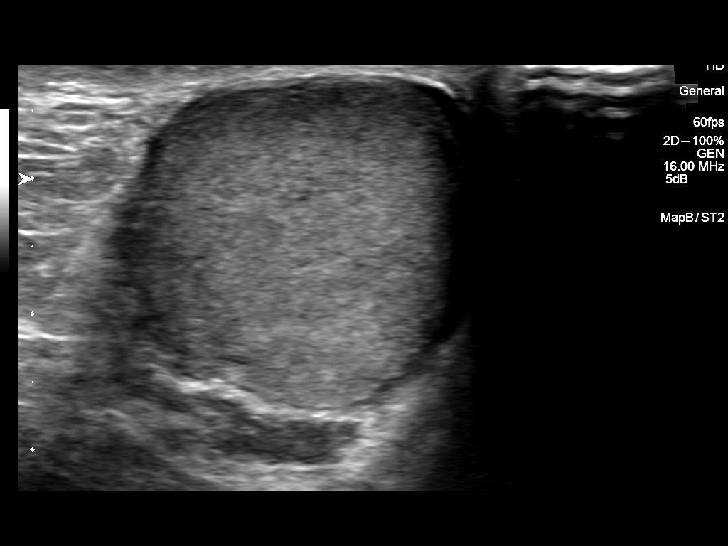
[im 42/42]
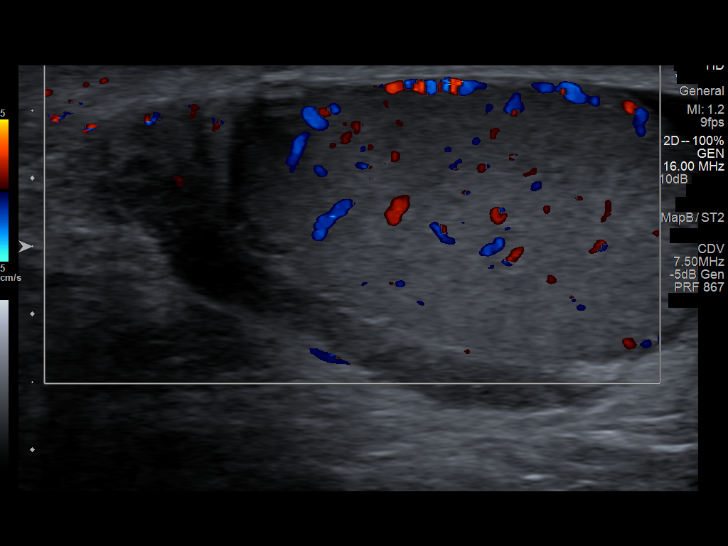

[14 of 25 positions shown; findings below may reference images not displayed]

FINDINGS: Right testicle

Measurements: 4.6 x 2.5 x 3.0 cm. Heterogeneous echotexture with
marked increased blood flow. No focal lesion.

Left testicle

Measurements: 4.2 x 1.9 x 2.5 cm. Symmetric and homogeneous
echotexture without focal lesion. Patent arterial and venous blood
flow.

Right epididymis:  Slightly thickened with increased blood flow

Left epididymis:  Normal in size and appearance.

Hydrocele:  Small bilateral hydroceles.

Varicocele:  None visualized.

Pulsed Doppler interrogation of both testes demonstrates low
resistance arterial and venous waveforms bilaterally.
IMPRESSION: Marked increased blood flow in the right epididymis and right
testicle consistent with epididymo-orchitis.

## 2014-06-08 MED ORDER — DEXTROSE 5 % IV SOLN
1.0000 g | Freq: Once | INTRAVENOUS | Status: AC
  Administered 2014-06-09: 1 g via INTRAVENOUS
  Filled 2014-06-08: qty 10

## 2014-06-08 MED ORDER — HYDROMORPHONE HCL PF 1 MG/ML IJ SOLN
1.0000 mg | Freq: Once | INTRAMUSCULAR | Status: AC
  Administered 2014-06-08: 1 mg via INTRAVENOUS
  Filled 2014-06-08: qty 1

## 2014-06-08 NOTE — ED Notes (Signed)
No abscess noted to scrotum. Painful to touch. Pt denies drainage.

## 2014-06-08 NOTE — ED Notes (Signed)
Ultrasound in progress  

## 2014-06-08 NOTE — ED Provider Notes (Signed)
CSN: [id number]     Arrival date & time 06/08/14  2207 History   First MD Initiated Contact with Patient 06/08/14 2217     Chief Complaint  Patient presents with  . Groin Swelling    Patient is a 33 y.o. male presenting with testicular pain. The history is provided by the patient.  Testicle Pain This is a new problem. The current episode started 2 days ago. The problem occurs constantly. The problem has been gradually worsening. Pertinent negatives include no abdominal pain. The symptoms are aggravated by walking (palpation). Nothing relieves the symptoms.  No dysuria.  No vomiting or diarrhea.  No fever.  No penile discharge.The pain is  On the right side.  Past Medical History  Diagnosis Date  . HTN (hypertension)   . Sleep apnea   . GERD (gastroesophageal reflux disease)   . Obesity hypoventilation syndrome 04/08/2014  . OSA (obstructive sleep apnea) 04/08/2014   Past Surgical History  Procedure Laterality Date  . Two knee surgeries Left 1995   Family History  Problem Relation Age of Onset  . Hypertension Mother   . Hypertension Father   . Hypertension Brother   . Hyperlipidemia Paternal Grandfather   . Diabetes Maternal Grandmother   . Heart failure Maternal Grandfather   . Heart failure Paternal Grandfather   . Multiple sclerosis Mother    History  Substance Use Topics  . Smoking status: Current Every Day Smoker -- 0.10 packs/day for 13 years    Types: Cigarettes  . Smokeless tobacco: Never Used  . Alcohol Use: Yes     Comment: occassional    Review of Systems  Gastrointestinal: Negative for abdominal pain.  Genitourinary: Positive for testicular pain.  All other systems reviewed and are negative.     Allergies  Review of patient's allergies indicates no known allergies.  Home Medications   Prior to Admission medications   Medication Sig Start Date End Date Taking? Authorizing Provider  chlorthalidone (HYGROTON) 25 MG tablet Take 25 mg by mouth daily.    Yes Historical Provider, MD  lisinopril (PRINIVIL,ZESTRIL) 40 MG tablet Take 1 tablet (40 mg total) by mouth daily. 05/05/14  Yes Roselee Culver, MD  Testosterone (ANDROGEL) 20.25 MG/1.25GM (1.62%) GEL Two pumps on each shoulder daily 05/05/14  Yes Roselee Culver, MD  varenicline (CHANTIX) 1 MG tablet Take 1 mg by mouth 2 (two) times daily.    Historical Provider, MD   BP 176/100  Pulse 84  Temp(Src) 98.6 F (37 C) (Oral)  Resp 22  SpO2 99% Physical Exam  Nursing note and vitals reviewed. Constitutional:  Obese   HENT:  Head: Normocephalic and atraumatic.  Right Ear: External ear normal.  Left Ear: External ear normal.  Eyes: Conjunctivae are normal. Right eye exhibits no discharge. Left eye exhibits no discharge. No scleral icterus.  Neck: Neck supple. No tracheal deviation present.  Cardiovascular: Normal rate, regular rhythm and intact distal pulses.   Pulmonary/Chest: Effort normal and breath sounds normal. No stridor. No respiratory distress. He has no wheezes. He has no rales.  Abdominal: Soft. Bowel sounds are normal. He exhibits no distension. There is no tenderness. There is no rebound and no guarding. Hernia confirmed negative in the right inguinal area and confirmed negative in the left inguinal area.  Genitourinary:    Right testis shows swelling and tenderness. Right testis shows no mass. Right testis is descended. Cremasteric reflex is not absent on the right side. Left testis shows no mass, no swelling  and no tenderness. Left testis is descended. Cremasteric reflex is not absent on the left side. No penile tenderness. No discharge found.  Musculoskeletal: He exhibits no edema and no tenderness.  Neurological: He is alert. He has normal strength. No cranial nerve deficit (no facial droop, extraocular movements intact, no slurred speech) or sensory deficit. He exhibits normal muscle tone. He displays no seizure activity. Coordination normal.  Skin: Skin is warm and  dry. No rash noted. He is not diaphoretic.  Psychiatric: He has a normal mood and affect.    ED Course  Procedures (including critical care time) Labs Review Labs Reviewed  CBC WITH DIFFERENTIAL - Abnormal; Notable for the following:    WBC 11.5 (*)    All other components within normal limits  BASIC METABOLIC PANEL - Abnormal; Notable for the following:    GFR calc non Af Amer 83 (*)    All other components within normal limits  GC/CHLAMYDIA PROBE AMP  URINALYSIS, ROUTINE W REFLEX MICROSCOPIC  RPR  HIV ANTIBODY (ROUTINE TESTING)     MDM  Pt has ttp on exam.  Likely epididymitis.  Will start empiric abx.  Check labs, and [place].  Dr Dina Rich will follow up   Dorie Rank, MD 06/08/14 [phone]

## 2014-06-08 NOTE — ED Notes (Addendum)
Pt presents with groin pain. Pt reports that his testicle on the right side is enlarged, which he first noticed two days ago. Pt reports that his girlfriend has had a yeast infection and is concerned. Pt reports that he has had tenderness at the end of the penis, pt denies penile discharge. Pt walks with a widened gait. Pt is A/O x4, in NAD, and vitals are WDL.

## 2014-06-09 LAB — HIV ANTIBODY (ROUTINE TESTING W REFLEX): HIV: NONREACTIVE

## 2014-06-09 LAB — RPR

## 2014-06-09 MED ORDER — IBUPROFEN 400 MG PO TABS: 400.0000 mg | ORAL_TABLET | Freq: Four times a day (QID) | ORAL | Status: DC | PRN

## 2014-06-09 MED ORDER — HYDROCODONE-ACETAMINOPHEN 5-325 MG PO TABS: 1.0000 | ORAL_TABLET | Freq: Four times a day (QID) | ORAL | Status: DC | PRN

## 2014-06-09 NOTE — ED Notes (Signed)
Awaiting  infusion of Rocephin IVPB to complete

## 2014-06-09 NOTE — ED Provider Notes (Signed)
Patient signed out by Dr. Tomi Bamberger pending [place].  [place] with evidence of epididymitis/orchitis.  Patient updated with results. He was empirically treated for STDs. Will discharge home with anti-inflammatories and norco.  Borderline kidney function.  Instructed patient to take ibuprofen for max of 5 days.  Patient stated understanding.  After history, exam, and medical workup I feel the patient has been appropriately medically screened and is safe for discharge home. Pertinent diagnoses were discussed with the patient. Patient was given return precautions.   Results for orders placed during the hospital encounter of 06/08/14  CBC WITH DIFFERENTIAL      Result Value Ref Range   WBC 11.5 (*) 4.0 - 10.5 K/uL   RBC 4.73  4.22 - 5.81 MIL/uL   Hemoglobin 13.7  13.0 - 17.0 g/dL   HCT 39.9  39.0 - 52.0 %   MCV 84.4  78.0 - 100.0 fL   MCH 29.0  26.0 - 34.0 pg   MCHC 34.3  30.0 - 36.0 g/dL   RDW 12.6  11.5 - 15.5 %   Platelets 300  150 - 400 K/uL   Neutrophils Relative % 66  43 - 77 %   Neutro Abs 7.7  1.7 - 7.7 K/uL   Lymphocytes Relative 23  12 - 46 %   Lymphs Abs 2.6  0.7 - 4.0 K/uL   Monocytes Relative 8  3 - 12 %   Monocytes Absolute 0.9  0.1 - 1.0 K/uL   Eosinophils Relative 3  0 - 5 %   Eosinophils Absolute 0.3  0.0 - 0.7 K/uL   Basophils Relative 0  0 - 1 %   Basophils Absolute 0.0  0.0 - 0.1 K/uL  BASIC METABOLIC PANEL      Result Value Ref Range   Sodium 138  137 - 147 mEq/L   Potassium 4.3  3.7 - 5.3 mEq/L   Chloride 101  96 - 112 mEq/L   CO2 25  19 - 32 mEq/L   Glucose, Bld 91  70 - 99 mg/dL   BUN 18  6 - 23 mg/dL   Creatinine, Ser 1.13  0.50 - 1.35 mg/dL   Calcium 9.1  8.4 - 10.5 mg/dL   GFR calc non Af Amer 83 (*) >90 mL/min   GFR calc Af Amer >90  >90 mL/min   Anion gap 12  5 - 15  URINALYSIS, ROUTINE W REFLEX MICROSCOPIC      Result Value Ref Range   Color, Urine YELLOW  YELLOW   APPearance CLEAR  CLEAR   Specific Gravity, Urine 1.026  1.005 - 1.030   pH 6.0  5.0 - 8.0   Glucose, UA NEGATIVE  NEGATIVE mg/dL   Hgb urine dipstick NEGATIVE  NEGATIVE   Bilirubin Urine NEGATIVE  NEGATIVE   Ketones, ur NEGATIVE  NEGATIVE mg/dL   Protein, ur NEGATIVE  NEGATIVE mg/dL   Urobilinogen, UA 0.2  0.0 - 1.0 mg/dL   Nitrite NEGATIVE  NEGATIVE   Leukocytes, UA NEGATIVE  NEGATIVE   US Scrotum  06/09/2014   CLINICAL DATA:  Right-sided scrotal pain.  EXAM: SCROTAL ULTRASOUND  DOPPLER ULTRASOUND OF THE TESTICLES  TECHNIQUE: Complete ultrasound examination of the testicles, epididymis, and other scrotal structures was performed. Color and spectral Doppler ultrasound were also utilized to evaluate blood flow to the testicles.  COMPARISON:  None.  FINDINGS: Right testicle  Measurements: 4.6 x 2.5 x 3.0 cm. Heterogeneous echotexture with marked increased blood flow. No focal lesion.  Left testicle  Measurements: 4.2 x 1.9 x 2.5 cm. Symmetric and homogeneous echotexture without focal lesion. Patent arterial and venous blood flow.  Right epididymis:  Slightly thickened with increased blood flow  Left epididymis:  Normal in size and appearance.  Hydrocele:  Small bilateral hydroceles.  Varicocele:  None visualized.  Pulsed Doppler interrogation of both testes demonstrates low resistance arterial and venous waveforms bilaterally.  IMPRESSION: Marked increased blood flow in the right epididymis and right testicle consistent with epididymo-orchitis.   Electronically Signed   By: Kalman Jewels M.D.   On: 06/09/2014 00:17   [place] Art/ven Flow Abd Pelv Doppler  06/09/2014   CLINICAL DATA:  Right-sided scrotal pain.  EXAM: SCROTAL ULTRASOUND  DOPPLER ULTRASOUND OF THE TESTICLES  TECHNIQUE: Complete ultrasound examination of the testicles, epididymis, and other scrotal structures was performed. Color and spectral Doppler ultrasound were also utilized to evaluate blood flow to the testicles.  COMPARISON:  None.  FINDINGS: Right testicle  Measurements: 4.6 x 2.5 x 3.0 cm. Heterogeneous echotexture with  marked increased blood flow. No focal lesion.  Left testicle  Measurements: 4.2 x 1.9 x 2.5 cm. Symmetric and homogeneous echotexture without focal lesion. Patent arterial and venous blood flow.  Right epididymis:  Slightly thickened with increased blood flow  Left epididymis:  Normal in size and appearance.  Hydrocele:  Small bilateral hydroceles.  Varicocele:  None visualized.  Pulsed Doppler interrogation of both testes demonstrates low resistance arterial and venous waveforms bilaterally.  IMPRESSION: Marked increased blood flow in the right epididymis and right testicle consistent with epididymo-orchitis.   Electronically Signed   By: Kalman Jewels M.D.   On: 06/09/2014 00:17      Merryl Hacker, MD 06/09/14 0030

## 2014-06-09 NOTE — Discharge Instructions (Signed)
Epididymitis °Epididymitis is a swelling (inflammation) of the epididymis. The epididymis is a cord-like structure along the back part of the testicle. Epididymitis is usually, but not always, caused by infection. This is usually a sudden problem beginning with chills, fever and pain behind the scrotum and in the testicle. There may be swelling and redness of the testicle. °DIAGNOSIS  °Physical examination will reveal a tender, swollen epididymis. Sometimes, cultures are obtained from the urine or from prostate secretions to help find out if there is an infection or if the cause is a different problem. Sometimes, blood work is performed to see if your white blood cell count is elevated and if a germ (bacterial) or viral infection is present. Using this knowledge, an appropriate medicine which kills germs (antibiotic) can be chosen by your caregiver. A viral infection causing epididymitis will most often go away (resolve) without treatment. °HOME CARE INSTRUCTIONS  °· Hot sitz baths for 20 minutes, 4 times per day, may help relieve pain. °· Only take over-the-counter or prescription medicines for pain, discomfort or fever as directed by your caregiver. °· Take all medicines, including antibiotics, as directed. Take the antibiotics for the full prescribed length of time even if you are feeling better. °· It is very important to keep all follow-up appointments. °SEEK IMMEDIATE MEDICAL CARE IF:  °· You have a fever. °· You have pain not relieved with medicines. °· You have any worsening of your problems. °· Your pain seems to come and go. °· You develop pain, redness, and swelling in the scrotum and surrounding areas. °MAKE SURE YOU:  °· Understand these instructions. °· Will watch your condition. °· Will get help right away if you are not doing well or get worse. °Document Released: 09/10/2000 Document Revised: 12/06/2011 Document Reviewed: 07/31/2009 °[employer]® Patient Information ©2015 [employer]. This information  is not intended to replace advice given to you by your health care provider. Make sure you discuss any questions you have with your health care provider. ° °

## 2014-06-10 ENCOUNTER — Other Ambulatory Visit: Payer: Self-pay | Admitting: Neurology

## 2014-06-10 DIAGNOSIS — G4733 Obstructive sleep apnea (adult) (pediatric): Secondary | ICD-10-CM

## 2014-06-10 LAB — GC/CHLAMYDIA PROBE AMP
CT Probe RNA: NEGATIVE
GC Probe RNA: NEGATIVE

## 2014-06-12 ENCOUNTER — Encounter: Payer: Self-pay | Admitting: Neurology

## 2014-06-21 DIAGNOSIS — G4730 Sleep apnea, unspecified: Secondary | ICD-10-CM | POA: Insufficient documentation

## 2014-07-12 ENCOUNTER — Encounter: Payer: Self-pay | Admitting: Family Medicine

## 2014-07-12 DIAGNOSIS — E291 Testicular hypofunction: Secondary | ICD-10-CM

## 2014-07-12 DIAGNOSIS — N5201 Erectile dysfunction due to arterial insufficiency: Secondary | ICD-10-CM

## 2014-08-04 ENCOUNTER — Other Ambulatory Visit: Payer: Self-pay | Admitting: Emergency Medicine

## 2014-09-14 ENCOUNTER — Ambulatory Visit (INDEPENDENT_AMBULATORY_CARE_PROVIDER_SITE_OTHER): Payer: 59 | Admitting: Physician Assistant

## 2014-09-14 VITALS — BP 158/88 | HR 73 | Temp 98.3°F | Resp 16 | Ht 73.0 in | Wt 370.4 lb

## 2014-09-14 DIAGNOSIS — I10 Essential (primary) hypertension: Secondary | ICD-10-CM

## 2014-09-14 DIAGNOSIS — R002 Palpitations: Secondary | ICD-10-CM

## 2014-09-14 DIAGNOSIS — R011 Cardiac murmur, unspecified: Secondary | ICD-10-CM

## 2014-09-14 MED ORDER — CARVEDILOL 6.25 MG PO TABS: 6.2500 mg | ORAL_TABLET | Freq: Two times a day (BID) | ORAL | Status: DC

## 2014-09-14 NOTE — Patient Instructions (Signed)
Take coreg in the morning and evening with meals. Continue to take blood pressure regularly. You will be called to make appointment with cardiology. Return to see me in 2 months for blood pressure check.

## 2014-09-14 NOTE — Progress Notes (Signed)
Subjective:    Patient ID: Calvin Bailey, male    DOB: 05/06/1981, 33 y.o.   MRN: [id number]  HPI  This is a 33 year old male with PMH HTN, OSA, GERD and tobacco abuse who is presenting for BP check. He reports he has been treated with lisinopril for 2-3 years. 6 months ago amlodipine was added to his regimen but he reports he was taken off of this d/t leg swelling. He was put on chlorthalidone but reports he experienced ED and stopped this 3 weeks after starting. He has been on lisinopril 40 mg since. He reports his fiance works at a medical office and he goes there to check his BP once a week - it is running 158/80-90s. He is endorsing some occasional headaches and dizziness. He has experiences three incidents of palpitations in the past 3 months. He feels his heart is racing. He experiences associated pre-syncope and SOB. These have all occurred with exertion. He denies chest pain, changes in vision. N/V. He reports compliance with his CPAP.  Review of Systems  Constitutional: Negative for fever and chills.  Eyes: Negative for visual disturbance.  Respiratory: Positive for shortness of breath.   Cardiovascular: Positive for palpitations. Negative for chest pain.  Gastrointestinal: Negative for nausea, vomiting and diarrhea.  Skin: Negative for rash.  Neurological: Positive for dizziness and headaches. Negative for numbness.   Patient Active Problem List   Diagnosis Date Noted  . Hypogonadism male 07/12/2014  . Erectile dysfunction due to arterial insufficiency 07/12/2014  . Obesity hypoventilation syndrome 04/08/2014  . Obesity, morbid 04/08/2014  . OSA (obstructive sleep apnea) 04/08/2014  . Tobacco use disorder 02/23/2011  . GERD (gastroesophageal reflux disease) 02/09/2011  . Obese 02/09/2011  . HYPERTENSION 07/21/2009  . HEADACHE 07/21/2009   Prior to Admission medications   Medication Sig Start Date End Date Taking? Authorizing Provider  ibuprofen (ADVIL,MOTRIN) 400 MG  tablet Take 1 tablet (400 mg total) by mouth every 6 (six) hours as needed for moderate pain. Limit use to 5 days. 06/09/14  Yes Merryl Hacker, MD  lisinopril (PRINIVIL,ZESTRIL) 40 MG tablet Take 1 tablet by mouth daily  "NEEDS FOLLOW UP FOR ADDITIONAL REFILLS" 08/04/14  Yes Mancel Bale, PA-C  Testosterone (ANDROGEL) 20.25 MG/1.25GM (1.62%) GEL Two pumps on each shoulder daily 05/05/14  Yes Roselee Culver, MD          Allergies  Allergen Reactions  . Chlorthalidone Nausea Only   Patient's social and family history were reviewed.     Objective:   Physical Exam  Constitutional: He is oriented to person, place, and time. He appears well-developed and well-nourished. No distress.  HENT:  Head: Normocephalic and atraumatic.  Right Ear: Hearing normal.  Left Ear: Hearing normal.  Nose: Nose normal.  Eyes: Conjunctivae and lids are normal. Right eye exhibits no discharge. Left eye exhibits no discharge. No scleral icterus.  Neck: Carotid bruit is not present.  Cardiovascular: Normal rate, regular rhythm, intact distal pulses and normal pulses.   Murmur heard.  Systolic (99991111) murmur is present  Pulmonary/Chest: Effort normal and breath sounds normal. No respiratory distress. He has no wheezes. He has no rhonchi. He has no rales.  Musculoskeletal: Normal range of motion.  Neurological: He is alert and oriented to person, place, and time. No cranial nerve deficit or sensory deficit.  Skin: Skin is warm, dry and intact. No lesion and no rash noted.  Velvet, dark appearance to skin of neck  Psychiatric: He  has a normal mood and affect. His speech is normal and behavior is normal. Thought content normal.  BP 158/88 mmHg  Pulse 73  Temp(Src) 98.3 F (36.8 C) (Oral)  Resp 16  Ht 6\' 1"  (1.854 m)  Wt 370 lb 6 oz (168.001 kg)  BMI 48.88 kg/m2  SpO2 98%     Assessment & Plan:  1. Essential hypertension Will add carvedilol to regimen d/t to uncontrolled HTN. Last kidney/liver function  3 months ago and normal. Glucose was normal at that time. He will continue to check BP weekly and will return in 2 months for follow up. - carvedilol (COREG) 6.25 MG tablet; Take 1 tablet (6.25 mg total) by mouth 2 (two) times daily with a meal.  Dispense: 60 tablet; Refill: 3  2. Palpitations 3. Newly recognized heart murmur Systolic murmur heart on CV exam today. Pt unaware of murmur and past office visits do not document a murmur. Palpitations are worrisome d/t long duration. Will refer to cardiology for eval of both of these problems. - Ambulatory referral to Cardiology   Benjaman Pott. Drenda Freeze, MHS [facility]  09/15/2014

## 2014-09-15 ENCOUNTER — Other Ambulatory Visit: Payer: Self-pay | Admitting: Physician Assistant

## 2014-09-15 NOTE — Progress Notes (Signed)
The patient was discussed with me and I agree with the diagnosis and treatment plan.  

## 2014-10-15 ENCOUNTER — Ambulatory Visit (INDEPENDENT_AMBULATORY_CARE_PROVIDER_SITE_OTHER): Payer: 59 | Admitting: Cardiovascular Disease

## 2014-10-15 ENCOUNTER — Encounter: Payer: Self-pay | Admitting: Cardiovascular Disease

## 2014-10-15 VITALS — BP 162/108 | HR 65 | Ht 74.0 in | Wt 366.0 lb

## 2014-10-15 DIAGNOSIS — R011 Cardiac murmur, unspecified: Secondary | ICD-10-CM

## 2014-10-15 DIAGNOSIS — G4733 Obstructive sleep apnea (adult) (pediatric): Secondary | ICD-10-CM

## 2014-10-15 DIAGNOSIS — I10 Essential (primary) hypertension: Secondary | ICD-10-CM

## 2014-10-15 NOTE — Patient Instructions (Signed)
Your physician has requested that you have an echocardiogram. Echocardiography is a painless test that uses sound waves to create images of your heart. It provides your doctor with information about the size and shape of your heart and how well your heart's chambers and valves are working. This procedure takes approximately one hour. There are no restrictions for this procedure. We will call with the results - if abnormal, we will schedule necessary follow-up  Your physician recommends that you continue on your current medications as directed. Please refer to the Current Medication list given to you today.  Your physician recommends that you schedule a follow-up appointment in: as needed with Dr. Acie Fredrickson

## 2014-10-15 NOTE — Progress Notes (Signed)
Cardiology Office Note   Date:  10/15/2014   ID:  [name], DOB Aug 24, 1981, MRN [id number]  PCP:  Delman Cheadle, MD  Cardiologist:   Thayer Headings, MD   Chief Complaint  Patient presents with  . OTHER    New Patient, HTN, palpitations and new murmur      History of Present Illness: Calvin Bailey is a 34 y.o. male who presents for further evaluation of his hypertension and a heart murmur.   He also has a history of obesity and  OSA, obesity hypoventilation syndrome. He has not taked his meds for the past 2 days.  Works for [employer].   He gets some exercise ( boxing)  Does cardio also Typically his BP is 150/70s.  He was started on Coreg recently.   Is helping some.  Tried HCTZ  in the past but had side effect.  Has OSA - wears his CPAP ( most of the time)     Past Medical History  Diagnosis Date  . HTN (hypertension)   . Sleep apnea   . GERD (gastroesophageal reflux disease)   . Obesity hypoventilation syndrome 04/08/2014  . OSA (obstructive sleep apnea) 04/08/2014    Past Surgical History  Procedure Laterality Date  . Two knee surgeries Left 1995     Current Outpatient Prescriptions  Medication Sig Dispense Refill  . carvedilol (COREG) 6.25 MG tablet Take 1 tablet (6.25 mg total) by mouth 2 (two) times daily with a meal. 60 tablet 3  . lisinopril (PRINIVIL,ZESTRIL) 40 MG tablet TAKE 1 TABLET BY MOUTH DAILY 30 tablet 0  . Testosterone (ANDROGEL) 20.25 MG/1.25GM (1.62%) GEL Two pumps on each shoulder daily 5 g 5  . [DISCONTINUED] LISINOPRIL-HYDROCHLOROTHIAZIDE PO Take by mouth.     No current facility-administered medications for this visit.    Allergies:   Chlorthalidone    Social History:  The patient  reports that he has been smoking Cigarettes.  He has a 1.3 pack-year smoking history. He has never used smokeless tobacco. He reports that he drinks alcohol. He reports that he does not use illicit drugs.   Family History:  The  patient's family history includes Diabetes in his maternal grandmother; Heart failure in his maternal grandfather and paternal grandfather; Hyperlipidemia in his paternal grandfather; Hypertension in his brother, father, and mother; Multiple sclerosis in his mother.    ROS:  Please see the history of present illness.   Otherwise, review of systems are positive for none.   All other systems are reviewed and negative.    PHYSICAL EXAM: VS:  BP 162/108 mmHg  Pulse 65  Ht 6\' 2"  (1.88 m)  Wt 366 lb (166.017 kg)  BMI 46.97 kg/m2 , BMI Body mass index is 46.97 kg/(m^2). GEN: Well nourished, well developed, in no acute distress HEENT: normal Neck: no JVD, carotid bruits, or masses Cardiac: RRR; no significant  murmurs, rubs, or gallops, 1-2+  edema  Respiratory:  clear to auscultation bilaterally, normal work of breathing GI: soft, nontender, nondistended, + BS MS: no deformity or atrophy Skin: warm and dry, no rash Neuro:  Strength and sensation are intact Psych: euthymic mood, full affect   EKG:  EKG is ordered today. The ekg ordered today demonstrates normal sinus rhythm at 65. He has no ST or T wave changes.   Recent Labs: 04/01/2014: ALT 19 06/08/2014: BUN 18; Creatinine 1.13; Hemoglobin 13.7; Platelets 300; Potassium 4.3; Sodium 138    Lipid Panel  Component Value Date/Time   CHOL 153 02/23/2011 1223   TRIG 172* 02/23/2011 1223   HDL 30* 02/23/2011 1223   CHOLHDL 5.1 02/23/2011 1223   VLDL 34 02/23/2011 1223   LDLCALC 89 02/23/2011 1223      Wt Readings from Last 3 Encounters:  10/15/14 366 lb (166.017 kg)  09/14/14 370 lb 6 oz (168.001 kg)  05/05/14 387 lb 6.4 oz (175.723 kg)      Other studies Reviewed: Additional studies/ records that were reviewed today include: I reviewed the notes from the medical doctor. Review of the above records demonstrates: He's had long-standing hypertension. He's been intermittently compliant. He seems to have lots of side effects from  medications.   ASSESSMENT AND PLAN:  1.  Essential hypertension: The patient presents today for evaluation of essential hypertension as well as a heart murmur. I was not able to hear any significant heart murmur today. The murmur was heard on exam time the HCTZ has been discontinued and he has been started on carvedilol. His possible that he may have an LVOT murmur.  I do not hear any significant  murmur today. He remains asymptomatic.  We'll get an echocardiogram for further evaluation of his long-standing hypertension and his heart murmur.   He is somewhat noncompliant with his medications. He frequently misses doses of medications because of his work schedule. He also does not wear his CPAP mask Coreg or basis.  He has been working hard on a weight loss program and has lost quite a bit of weight.  I'll have him follow-up with his medical doctor for further management of his hypertension. He does have edema and I suggested that he be restarted on the HCTZ. Complained of systems side effects ( sexual dysfunction) with the HCTZ. .     2. Cardiac murmur: Patient does not have any significant murmur on exam today. We'll get an echo card gram to evaluate him for the possibility of left ventricular hypertrophy.   3. Obstructive sleep apnea: The patient wears his CPAP mask intermittent. I've encouraged him to work on a good weight loss program and to wear CPAP mask on a regular basis.   Current medicines are reviewed at length with the patient today.  The patient does not have concerns regarding medicines.  The following changes have been made:  no change  Labs/ tests ordered today include: echocardiogram   No orders of the defined types were placed in this encounter.    Disposition:   FU with his general medical doctor  in several months.  He will see me for follow up if his echo is markedly abormal.    Signed, Sims Laday, Wonda Cheng, MD  10/15/2014 9:26 AM    [address], [place], [place]  [address] Phone: [phone]; Fax: [phone]

## 2014-10-25 ENCOUNTER — Other Ambulatory Visit (HOSPITAL_COMMUNITY): Payer: 59

## 2014-11-05 ENCOUNTER — Other Ambulatory Visit: Payer: Self-pay

## 2014-11-05 ENCOUNTER — Other Ambulatory Visit: Payer: Self-pay | Admitting: Physician Assistant

## 2014-11-05 NOTE — Telephone Encounter (Signed)
Patient is wondering what he needs to do to get a years prescription for his  lisinopril (PRINIVIL,ZESTRIL) 40 MG tablet   [phone] (H)

## 2014-11-05 NOTE — Telephone Encounter (Signed)
Dr Brigitte Pulse, I see that this is your pt so will send this to you along with forwarding a RF req from pharm for this med. It looks like Elmyra Ricks wanted pt to check back in 2 mos when she saw him in late Dec. Pended for 1 year from last OV.

## 2014-11-06 ENCOUNTER — Other Ambulatory Visit: Payer: Self-pay | Admitting: Physician Assistant

## 2014-11-22 MED ORDER — LISINOPRIL 40 MG PO TABS: ORAL_TABLET | ORAL | Status: DC

## 2014-11-22 NOTE — Telephone Encounter (Signed)
To get a years supply of his lisinopril, pt needs to come see me in an office visit and have his lab work done at that time.  I would highly recommend that he check his BP outside of the office - purchase a home BP cuff. We have not given him a years supply as everytime he comes in his BP is not controlled so I worry that if I give him that,  He won't come back for a year and then he will be living with uncontrolled high blood pressure - "the silent killer".

## 2014-11-25 NOTE — Telephone Encounter (Signed)
Left message for pt to call back  °

## 2014-11-27 NOTE — Telephone Encounter (Signed)
Called pt, left voicemail to call back if he still needs to speak with me.

## 2015-01-28 ENCOUNTER — Ambulatory Visit (INDEPENDENT_AMBULATORY_CARE_PROVIDER_SITE_OTHER): Payer: 59 | Admitting: Urgent Care

## 2015-01-28 VITALS — BP 152/96 | HR 76 | Temp 98.1°F | Resp 18 | Ht 74.0 in | Wt 364.0 lb

## 2015-01-28 DIAGNOSIS — Z720 Tobacco use: Secondary | ICD-10-CM | POA: Diagnosis not present

## 2015-01-28 DIAGNOSIS — I10 Essential (primary) hypertension: Secondary | ICD-10-CM

## 2015-01-28 DIAGNOSIS — F17200 Nicotine dependence, unspecified, uncomplicated: Secondary | ICD-10-CM

## 2015-01-28 DIAGNOSIS — Z131 Encounter for screening for diabetes mellitus: Secondary | ICD-10-CM

## 2015-01-28 DIAGNOSIS — G4733 Obstructive sleep apnea (adult) (pediatric): Secondary | ICD-10-CM

## 2015-01-28 LAB — POCT URINALYSIS DIPSTICK
BILIRUBIN UA: NEGATIVE
GLUCOSE UA: NEGATIVE
Leukocytes, UA: NEGATIVE
Nitrite, UA: NEGATIVE
Protein, UA: 30
RBC UA: NEGATIVE
Spec Grav, UA: >=1.03
Urobilinogen, UA: 1
pH, UA: 6

## 2015-01-28 LAB — POCT GLYCOSYLATED HEMOGLOBIN (HGB A1C): Hemoglobin A1C: 5.1

## 2015-01-28 MED ORDER — CARVEDILOL 12.5 MG PO TABS: 12.5000 mg | ORAL_TABLET | Freq: Two times a day (BID) | ORAL | Status: DC

## 2015-01-28 NOTE — Progress Notes (Signed)
MRN: [id number] DOB: [birth year]  Subjective:   Calvin Bailey is a 34 y.o. male presenting for chief complaint of Medication Refill  Reports ~5 year history of HTN, currently managed with lisinopril and carvedilol. Has previously tried HCT but stopped this due to ED. Has also tried amlodipine but stopped this due to lower leg swelling. Admits that he still has lower leg swelling but it's not as bad as when he was on amlodipine. Diet is non-compliant, admits that he tries to eat healthy but has a difficult time with his "wife's cooking". Exercises regularly at the gym, does weight lifting. Reports that he was actively trying to lose weight but this has slowed down. Denies headache, blurred vision, double vision, chest pain, shob, n/v, abdominal pain, urinary changes, flank pain. Smoking 1/2ppd, trying to cut back, used to smoke 1ppd, not interested in quitting right now because he thinks that his eating will increase. Rare alcohol drink. Of note, patient has been seen by cardiology 09/2014, recommended management by primary care. Echo was normal per patient. Patient also has a history of OSA, is non-compliant with this CPAP. He was recommended for bariatric surgery, was undergoing a program for this, ultimately changed his mind about this and is no longer interested in the surgery. Denies any other aggravating or relieving factors, no other questions or concerns.  Calvin Bailey has a current medication list which includes the following prescription(s): carvedilol and lisinopril. He is allergic to chlorthalidone.  Calvin Bailey  has a past medical history of HTN (hypertension); Sleep apnea; GERD (gastroesophageal reflux disease); Obesity hypoventilation syndrome (04/08/2014); and OSA (obstructive sleep apnea) (04/08/2014). Also  has past surgical history that includes two knee surgeries (Left, 1995).  ROS As in subjective.  Objective:   Vitals: BP 152/96 mmHg  Pulse 76  Temp(Src) 98.1 F (36.7 C) (Oral)   Resp 18  Ht 6\' 2"  (1.88 m)  Wt 364 lb (165.109 kg)  BMI 46.71 kg/m2  SpO2 96%  BP Readings from Last 3 Encounters:  01/28/15 152/96  10/15/14 162/108  09/14/14 158/88   Wt Readings from Last 3 Encounters:  01/28/15 364 lb (165.109 kg)  10/15/14 366 lb (166.017 kg)  09/14/14 370 lb 6 oz (168.001 kg)   Physical Exam  Constitutional: He is oriented to person, place, and time. He appears well-developed and well-nourished.  HENT:  Mouth/Throat: Oropharynx is clear and moist. No oropharyngeal exudate.  Airway very narrow, redundant palate.  Eyes: EOM are normal. Pupils are equal, round, and reactive to light. Right eye exhibits no discharge. Left eye exhibits no discharge. No scleral icterus.  Neck: Normal range of motion. Neck supple. No thyromegaly present.  Cardiovascular: Normal rate, regular rhythm and intact distal pulses.  Exam reveals no gallop and no friction rub.   No murmur heard. Pulmonary/Chest: No stridor. No respiratory distress. He has no wheezes. He has no rales. He exhibits no tenderness.  Abdominal: Soft. Bowel sounds are normal. He exhibits no distension and no mass. There is no tenderness.  Musculoskeletal: Normal range of motion. He exhibits no edema or tenderness.  Lymphadenopathy:    He has no cervical adenopathy.  Neurological: He is alert and oriented to person, place, and time.  Skin: Skin is warm and dry. No rash noted. No erythema. No pallor.   Results for orders placed or performed in visit on 01/28/15 (from the past 24 hour(s))  POCT urinalysis dipstick     Status: None   Collection Time: 01/28/15  6:38 PM  Result Value Ref Range   Color, UA yellow    Clarity, UA clear    Glucose, UA neg    Bilirubin, UA neg    Ketones, UA trace    Spec Grav, UA >=1.030    Blood, UA neg    pH, UA 6.0    Protein, UA 30    Urobilinogen, UA 1.0    Nitrite, UA neg    Leukocytes, UA Negative   POCT glycosylated hemoglobin (Hb A1C)     Status: None   Collection  Time: 01/28/15  6:39 PM  Result Value Ref Range   Hemoglobin A1C 5.1    Assessment and Plan :   1. Uncontrolled stage 2 hypertension - Multiple contributing factors including dietary non-compliance, smoking, morbid obesity, OSA - Will increase Coreg from 6.25mg  BID to 12.5mg  BID - Patient unable to take HCT, CCB due to AE's, I will hold off on increasing his lisinopril since he is on 40mg  of lisinopril and want to avoid potential for hypersensitivity to ACEi given that he has failed other BP meds - Recommended follow up in 3 months  2. OSA (obstructive sleep apnea) - Referral to ENT due to non-compliance with CPAP, consider surgical intervention for OSA  3. Morbid obesity - Discussed his weight at length, patient states he will reconsider bariatric surgery  4. Tobacco use disorder - Commended patient for cutting back, offered help if he changes his mind about fully quitting  5. Diabetes mellitus screening - Negative, monitor  Jaynee Eagles, PA-C [facility] [phone] 01/28/2015 5:42 PM

## 2015-01-28 NOTE — Patient Instructions (Signed)
Hypertension Hypertension, commonly called high blood pressure, is when the force of blood pumping through your arteries is too strong. Your arteries are the blood vessels that carry blood from your heart throughout your body. A blood pressure reading consists of a higher number over a lower number, such as 110/72. The higher number (systolic) is the pressure inside your arteries when your heart pumps. The lower number (diastolic) is the pressure inside your arteries when your heart relaxes. Ideally you want your blood pressure below 120/80. Hypertension forces your heart to work harder to pump blood. Your arteries may become narrow or stiff. Having hypertension puts you at risk for heart disease, stroke, and other problems.  RISK FACTORS Some risk factors for high blood pressure are controllable. Others are not.  Risk factors you cannot control include:   Race. You may be at higher risk if you are African American.  Age. Risk increases with age.  Gender. Men are at higher risk than women before age 63 years. After age 54, women are at higher risk than men. Risk factors you can control include: 1. Not getting enough exercise or physical activity. 2. Being overweight. 3. Getting too much fat, sugar, calories, or salt in your diet. 4. Drinking too much alcohol. SIGNS AND SYMPTOMS Hypertension does not usually cause signs or symptoms. Extremely high blood pressure (hypertensive crisis) may cause headache, anxiety, shortness of breath, and nosebleed. DIAGNOSIS  To check if you have hypertension, your health care provider will measure your blood pressure while you are seated, with your arm held at the level of your heart. It should be measured at least twice using the same arm. Certain conditions can cause a difference in blood pressure between your right and left arms. A blood pressure reading that is higher than normal on one occasion does not mean that you need treatment. If one blood pressure  reading is high, ask your health care provider about having it checked again. TREATMENT  Treating high blood pressure includes making lifestyle changes and possibly taking medicine. Living a healthy lifestyle can help lower high blood pressure. You may need to change some of your habits. Lifestyle changes may include:  Following the DASH diet. This diet is high in fruits, vegetables, and whole grains. It is low in salt, red meat, and added sugars.  Getting at least 2 hours of brisk physical activity every week.  Losing weight if necessary.  Not smoking.  Limiting alcoholic beverages.  Learning ways to reduce stress. If lifestyle changes are not enough to get your blood pressure under control, your health care provider may prescribe medicine. You may need to take more than one. Work closely with your health care provider to understand the risks and benefits. HOME CARE INSTRUCTIONS  Have your blood pressure rechecked as directed by your health care provider.   Take medicines only as directed by your health care provider. Follow the directions carefully. Blood pressure medicines must be taken as prescribed. The medicine does not work as well when you skip doses. Skipping doses also puts you at risk for problems.   Do not smoke.   Monitor your blood pressure at home as directed by your health care provider. SEEK MEDICAL CARE IF:   You think you are having a reaction to medicines taken.  You have recurrent headaches or feel dizzy.  You have swelling in your ankles.  You have trouble with your vision. SEEK IMMEDIATE MEDICAL CARE IF:  You develop a severe headache or confusion.  You have unusual weakness, numbness, or feel faint.  You have severe chest or abdominal pain.  You vomit repeatedly.  You have trouble breathing. MAKE SURE YOU:   Understand these instructions.  Will watch your condition.  Will get help right away if you are not doing well or get  worse. Document Released: 09/13/2005 Document Revised: 01/28/2014 Document Reviewed: 07/06/2013 [facility] Patient Information 2015 [place], [place]. This information is not intended to replace advice given to you by your health care provider. Make sure you discuss any questions you have with your health care provider.   Sleep Apnea  Sleep apnea is a sleep disorder characterized by abnormal pauses in breathing while you sleep. When your breathing pauses, the level of oxygen in your blood decreases. This causes you to move out of deep sleep and into light sleep. As a result, your quality of sleep is poor, and the system that carries your blood throughout your body (cardiovascular system) experiences stress. If sleep apnea remains untreated, the following conditions can develop:  High blood pressure (hypertension).  Coronary artery disease.  Inability to achieve or maintain an erection (impotence).  Impairment of your thought process (cognitive dysfunction). There are three types of sleep apnea: 5. Obstructive sleep apnea--Pauses in breathing during sleep because of a blocked airway. 6. Central sleep apnea--Pauses in breathing during sleep because the area of the brain that controls your breathing does not send the correct signals to the muscles that control breathing. 7. Mixed sleep apnea--A combination of both obstructive and central sleep apnea. RISK FACTORS The following risk factors can increase your risk of developing sleep apnea:  Being overweight.  Smoking.  Having narrow passages in your nose and throat.  Being of older age.  Being male.  Alcohol use.  Sedative and tranquilizer use.  Ethnicity. Among individuals younger than 35 years, African Americans are at increased risk of sleep apnea. SYMPTOMS   Difficulty staying asleep.  Daytime sleepiness and fatigue.  Loss of energy.  Irritability.  Loud, heavy snoring.  Morning headaches.  Trouble  concentrating.  Forgetfulness.  Decreased interest in sex. DIAGNOSIS  In order to diagnose sleep apnea, your caregiver will perform a physical examination. Your caregiver may suggest that you take a home sleep test. Your caregiver may also recommend that you spend the night in a sleep lab. In the sleep lab, several monitors record information about your heart, lungs, and brain while you sleep. Your leg and arm movements and blood oxygen level are also recorded. TREATMENT The following actions may help to resolve mild sleep apnea:  Sleeping on your side.   Using a decongestant if you have nasal congestion.   Avoiding the use of depressants, including alcohol, sedatives, and narcotics.   Losing weight and modifying your diet if you are overweight. There also are devices and treatments to help open your airway:  Oral appliances. These are custom-made mouthpieces that shift your lower jaw forward and slightly open your bite. This opens your airway.  Devices that create positive airway pressure. This positive pressure "splints" your airway open to help you breathe better during sleep. The following devices create positive airway pressure:  Continuous positive airway pressure (CPAP) device. The CPAP device creates a continuous level of air pressure with an air pump. The air is delivered to your airway through a mask while you sleep. This continuous pressure keeps your airway open.  Nasal expiratory positive airway pressure (EPAP) device. The EPAP device creates positive air pressure as you exhale. The  device consists of single-use valves, which are inserted into each nostril and held in place by adhesive. The valves create very little resistance when you inhale but create much more resistance when you exhale. That increased resistance creates the positive airway pressure. This positive pressure while you exhale keeps your airway open, making it easier to breath when you inhale again.  Bilevel  positive airway pressure (BPAP) device. The BPAP device is used mainly in patients with central sleep apnea. This device is similar to the CPAP device because it also uses an air pump to deliver continuous air pressure through a mask. However, with the BPAP machine, the pressure is set at two different levels. The pressure when you exhale is lower than the pressure when you inhale.  Surgery. Typically, surgery is only done if you cannot comply with less invasive treatments or if the less invasive treatments do not improve your condition. Surgery involves removing excess tissue in your airway to create a wider passage way. Document Released: 09/03/2002 Document Revised: 01/08/2013 Document Reviewed: 01/20/2012 [facility] Patient Information 2015 [place], [place]. This information is not intended to replace advice given to you by your health care provider. Make sure you discuss any questions you have with your health care provider.

## 2015-01-29 LAB — COMPLETE METABOLIC PANEL WITH GFR
ALBUMIN: 4.1 g/dL (ref 3.5–5.2)
ALT: 17 U/L (ref 0–53)
AST: 12 U/L (ref 0–37)
Alkaline Phosphatase: 89 U/L (ref 39–117)
BILIRUBIN TOTAL: 0.6 mg/dL (ref 0.2–1.2)
BUN: 13 mg/dL (ref 6–23)
CHLORIDE: 103 meq/L (ref 96–112)
CO2: 25 mEq/L (ref 19–32)
Calcium: 9.1 mg/dL (ref 8.4–10.5)
Creat: 1.1 mg/dL (ref 0.50–1.35)
GFR, Est Non African American: 87 mL/min
GLUCOSE: 82 mg/dL (ref 70–99)
POTASSIUM: 4 meq/L (ref 3.5–5.3)
SODIUM: 139 meq/L (ref 135–145)
Total Protein: 7.5 g/dL (ref 6.0–8.3)

## 2015-01-30 LAB — MICROALBUMIN, URINE: Microalb, Ur: 6.4 mg/dL — ABNORMAL HIGH (ref <2.0)

## 2015-02-03 ENCOUNTER — Telehealth: Payer: Self-pay | Admitting: Urgent Care

## 2015-02-03 NOTE — Telephone Encounter (Signed)
Reported small amount of protein in his urine. All other labs unremarkable. Patient is doing well with the new dose of Coreg. He is not yet checking his BP so I suggested he do this at least weekly. Patient is scheduled with ENT for OSA surgery consult. He is still considering bariatric surgery. Will follow up in 3 months. Patient agreed.

## 2015-07-30 ENCOUNTER — Ambulatory Visit (INDEPENDENT_AMBULATORY_CARE_PROVIDER_SITE_OTHER): Payer: 59 | Admitting: Family Medicine

## 2015-07-30 VITALS — BP 168/98 | HR 78 | Temp 98.7°F | Resp 16 | Ht 74.0 in | Wt 372.0 lb

## 2015-07-30 DIAGNOSIS — B351 Tinea unguium: Secondary | ICD-10-CM

## 2015-07-30 DIAGNOSIS — M1990 Unspecified osteoarthritis, unspecified site: Secondary | ICD-10-CM

## 2015-07-30 DIAGNOSIS — B353 Tinea pedis: Secondary | ICD-10-CM | POA: Diagnosis not present

## 2015-07-30 DIAGNOSIS — L03031 Cellulitis of right toe: Secondary | ICD-10-CM | POA: Diagnosis not present

## 2015-07-30 DIAGNOSIS — I10 Essential (primary) hypertension: Secondary | ICD-10-CM

## 2015-07-30 LAB — COMPREHENSIVE METABOLIC PANEL
ALK PHOS: 88 U/L (ref 40–115)
ALT: 18 U/L (ref 9–46)
AST: 15 U/L (ref 10–40)
Albumin: 4 g/dL (ref 3.6–5.1)
BUN: 16 mg/dL (ref 7–25)
CALCIUM: 8.9 mg/dL (ref 8.6–10.3)
CO2: 30 mmol/L (ref 20–31)
Chloride: 102 mmol/L (ref 98–110)
Creat: 1.22 mg/dL (ref 0.60–1.35)
Glucose, Bld: 112 mg/dL — ABNORMAL HIGH (ref 65–99)
POTASSIUM: 4.3 mmol/L (ref 3.5–5.3)
Sodium: 139 mmol/L (ref 135–146)
TOTAL PROTEIN: 7.1 g/dL (ref 6.1–8.1)
Total Bilirubin: 0.6 mg/dL (ref 0.2–1.2)

## 2015-07-30 LAB — POCT CBC
GRANULOCYTE PERCENT: 75.2 % (ref 37–80)
HEMATOCRIT: 43.8 % (ref 43.5–53.7)
Hemoglobin: 15.5 g/dL (ref 14.1–18.1)
Lymph, poc: 1.8 (ref 0.6–3.4)
MCH, POC: 30.2 pg (ref 27–31.2)
MCHC: 35.3 g/dL (ref 31.8–35.4)
MCV: 85.6 fL (ref 80–97)
MID (cbc): 0.6 (ref 0–0.9)
MPV: 7 fL (ref 0–99.8)
POC GRANULOCYTE: 7.4 — AB (ref 2–6.9)
POC LYMPH %: 18.3 % (ref 10–50)
POC MID %: 6.5 % (ref 0–12)
Platelet Count, POC: 213 10*3/uL (ref 142–424)
RBC: 5.12 M/uL (ref 4.69–6.13)
RDW, POC: 12.7 %
WBC: 9.9 10*3/uL (ref 4.6–10.2)

## 2015-07-30 LAB — URIC ACID: URIC ACID, SERUM: 7.1 mg/dL (ref 4.0–7.8)

## 2015-07-30 LAB — GLUCOSE, POCT (MANUAL RESULT ENTRY): POC Glucose: 113 mg/dl — AB (ref 70–99)

## 2015-07-30 LAB — POCT SEDIMENTATION RATE: POCT SED RATE: 11 mm/h (ref 0–22)

## 2015-07-30 MED ORDER — COLCHICINE 0.6 MG PO TABS: ORAL_TABLET | ORAL | Status: DC

## 2015-07-30 MED ORDER — FUROSEMIDE 40 MG PO TABS: 40.0000 mg | ORAL_TABLET | Freq: Every day | ORAL | Status: DC

## 2015-07-30 MED ORDER — OXYCODONE-ACETAMINOPHEN 5-325 MG PO TABS: 1.0000 | ORAL_TABLET | ORAL | Status: DC | PRN

## 2015-07-30 MED ORDER — TERBINAFINE HCL 250 MG PO TABS: 250.0000 mg | ORAL_TABLET | Freq: Every day | ORAL | Status: DC

## 2015-07-30 MED ORDER — TRIAMTERENE-HCTZ 37.5-25 MG PO TABS: 1.0000 | ORAL_TABLET | Freq: Every day | ORAL | Status: DC

## 2015-07-30 MED ORDER — INDOMETHACIN 50 MG PO CAPS: 50.0000 mg | ORAL_CAPSULE | Freq: Three times a day (TID) | ORAL | Status: DC

## 2015-07-30 MED ORDER — CARVEDILOL 12.5 MG PO TABS
12.5000 mg | ORAL_TABLET | Freq: Two times a day (BID) | ORAL | Status: DC
Start: 2015-07-30 — End: 2016-07-19

## 2015-07-30 MED ORDER — LISINOPRIL 40 MG PO TABS: ORAL_TABLET | ORAL | Status: DC

## 2015-07-30 MED ORDER — CEPHALEXIN 500 MG PO CAPS: 500.0000 mg | ORAL_CAPSULE | Freq: Four times a day (QID) | ORAL | Status: DC

## 2015-07-30 MED ORDER — TERBINAFINE HCL 1 % EX CREA: 1.0000 "application " | TOPICAL_CREAM | Freq: Two times a day (BID) | CUTANEOUS | Status: DC

## 2015-07-30 MED ORDER — NYSTATIN 100000 UNIT/GM EX POWD: Freq: Four times a day (QID) | CUTANEOUS | Status: DC

## 2015-07-30 NOTE — Patient Instructions (Signed)
You can reduce your uric acid level by avoiding red meat, saturated fats (dairy fats, butter fats, animal fats), high fructose corn syrup, and beer and drinking more water.  Gout Gout is an inflammatory arthritis caused by a buildup of uric acid crystals in the joints. Uric acid is a chemical that is normally present in the blood. When the level of uric acid in the blood is too high it can form crystals that deposit in your joints and tissues. This causes joint redness, soreness, and swelling (inflammation). Repeat attacks are common. Over time, uric acid crystals can form into masses (tophi) near a joint, destroying bone and causing disfigurement. Gout is treatable and often preventable. CAUSES  The disease begins with elevated levels of uric acid in the blood. Uric acid is produced by your body when it breaks down a naturally found substance called purines. Certain foods you eat, such as meats and fish, contain high amounts of purines. Causes of an elevated uric acid level include:  Being passed down from parent to child (heredity).  Diseases that cause increased uric acid production (such as obesity, psoriasis, and certain cancers).  Excessive alcohol use.  Diet, especially diets rich in meat and seafood.  Medicines, including certain cancer-fighting medicines (chemotherapy), water pills (diuretics), and aspirin.  Chronic kidney disease. The kidneys are no longer able to remove uric acid well.  Problems with metabolism. Conditions strongly associated with gout include:  Obesity.  High blood pressure.  High cholesterol.  Diabetes. Not everyone with elevated uric acid levels gets gout. It is not understood why some people get gout and others do not. Surgery, joint injury, and eating too much of certain foods are some of the factors that can lead to gout attacks. SYMPTOMS   An attack of gout comes on quickly. It causes intense pain with redness, swelling, and warmth in a  joint.  Fever can occur.  Often, only one joint is involved. Certain joints are more commonly involved:  Base of the big toe.  Knee.  Ankle.  Wrist.  Finger. Without treatment, an attack usually goes away in a few days to weeks. Between attacks, you usually will not have symptoms, which is different from many other forms of arthritis. DIAGNOSIS  Your caregiver will suspect gout based on your symptoms and exam. In some cases, tests may be recommended. The tests may include:  Blood tests.  Urine tests.  X-rays.  Joint fluid exam. This exam requires a needle to remove fluid from the joint (arthrocentesis). Using a microscope, gout is confirmed when uric acid crystals are seen in the joint fluid. TREATMENT  There are two phases to gout treatment: treating the sudden onset (acute) attack and preventing attacks (prophylaxis).  Treatment of an Acute Attack.  Medicines are used. These include anti-inflammatory medicines or steroid medicines.  An injection of steroid medicine into the affected joint is sometimes necessary.  The painful joint is rested. Movement can worsen the arthritis.  You may use warm or cold treatments on painful joints, depending which works best for you.  Treatment to Prevent Attacks.  If you suffer from frequent gout attacks, your caregiver may advise preventive medicine. These medicines are started after the acute attack subsides. These medicines either help your kidneys eliminate uric acid from your body or decrease your uric acid production. You may need to stay on these medicines for a very long time.  The early phase of treatment with preventive medicine can be associated with an increase in acute  gout attacks. For this reason, during the first few months of treatment, your caregiver may also advise you to take medicines usually used for acute gout treatment. Be sure you understand your caregiver's directions. Your caregiver may make several adjustments  to your medicine dose before these medicines are effective.  Discuss dietary treatment with your caregiver or dietitian. Alcohol and drinks high in sugar and fructose and foods such as meat, poultry, and seafood can increase uric acid levels. Your caregiver or dietitian can advise you on drinks and foods that should be limited. HOME CARE INSTRUCTIONS   Do not take aspirin to relieve pain. This raises uric acid levels.  Only take over-the-counter or prescription medicines for pain, discomfort, or fever as directed by your caregiver.  Rest the joint as much as possible. When in bed, keep sheets and blankets off painful areas.  Keep the affected joint raised (elevated).  Apply warm or cold treatments to painful joints. Use of warm or cold treatments depends on which works best for you.  Use crutches if the painful joint is in your leg.  Drink enough fluids to keep your urine clear or pale yellow. This helps your body get rid of uric acid. Limit alcohol, sugary drinks, and fructose drinks.  Follow your dietary instructions. Pay careful attention to the amount of protein you eat. Your daily diet should emphasize fruits, vegetables, whole grains, and fat-free or low-fat milk products. Discuss the use of coffee, vitamin C, and cherries with your caregiver or dietitian. These may be helpful in lowering uric acid levels.  Maintain a healthy body weight. SEEK MEDICAL CARE IF:   You develop diarrhea, vomiting, or any side effects from medicines.  You do not feel better in 24 hours, or you are getting worse. SEEK IMMEDIATE MEDICAL CARE IF:   Your joint becomes suddenly more tender, and you have chills or a fever. MAKE SURE YOU:   Understand these instructions.  Will watch your condition.  Will get help right away if you are not doing well or get worse.   This information is not intended to replace advice given to you by your health care provider. Make sure you discuss any questions you have  with your health care provider.   Document Released: 09/10/2000 Document Revised: 10/04/2014 Document Reviewed: 04/26/2012 Elsevier Interactive Patient Education 2016 [employer]  Cellulitis Cellulitis is an infection of the skin and the tissue beneath it. The infected area is usually red and tender. Cellulitis occurs most often in the arms and lower legs.  CAUSES  Cellulitis is caused by bacteria that enter the skin through cracks or cuts in the skin. The most common types of bacteria that cause cellulitis are staphylococci and streptococci. SIGNS AND SYMPTOMS   Redness and warmth.  Swelling.  Tenderness or pain.  Fever. DIAGNOSIS  Your health care provider can usually determine what is wrong based on a physical exam. Blood tests may also be done. TREATMENT  Treatment usually involves taking an antibiotic medicine. HOME CARE INSTRUCTIONS   Take your antibiotic medicine as directed by your health care provider. Finish the antibiotic even if you start to feel better.  Keep the infected arm or leg elevated to reduce swelling.  Apply a warm cloth to the affected area up to 4 times per day to relieve pain.  Take medicines only as directed by your health care provider.  Keep all follow-up visits as directed by your health care provider. SEEK MEDICAL CARE IF:   You notice  red streaks coming from the infected area.  Your red area gets larger or turns dark in color.  Your bone or joint underneath the infected area becomes painful after the skin has healed.  Your infection returns in the same area or another area.  You notice a swollen bump in the infected area.  You develop new symptoms.  You have a fever. SEEK IMMEDIATE MEDICAL CARE IF:   You feel very sleepy.  You develop vomiting or diarrhea.  You have a general ill feeling (malaise) with muscle aches and pains.   This information is not intended to replace advice given to you by your health care provider. Make  sure you discuss any questions you have with your health care provider.   Document Released: 06/23/2005 Document Revised: 06/04/2015 Document Reviewed: 11/29/2011 Elsevier Interactive Patient Education [employer].

## 2015-07-30 NOTE — Progress Notes (Signed)
Subjective:    Patient ID: Calvin Bailey, male    DOB: 28-Dec-1980, 34 y.o.   MRN: [id number] This chart was scribed for Delman Cheadle, MD by Zola Button, Medical Scribe. This patient was seen in Room 1 and the patient's care was started at 11:06 AM.   Chief Complaint  Patient presents with  . Foot Pain    left foot/ x 2 days    HPI HPI Comments: Calvin Bailey is a 34 y.o. male with a history of hypertension who presents to the [facility] complaining of gradual onset left foot pain mostly between the 1st and 2nd toes that started 2 days ago. The pain is not a burning pain. He did have a fever 2 nights ago. Patient states he works in [employer] and notes his feet often get wet; he believes his foot may have an infection. He has tried OTC athlete's foot cream. Patient denies chills, rash, and pain into his leg. He also denies history of gout and recent antibiotics. His A1c 6 months ago was 5.1.   Patient states he has been taking both of his blood pressure medications and is requesting a refill. He checked his blood pressure 2 weeks ago which was 138/86 at that time.  Past Medical History  Diagnosis Date  . HTN (hypertension)   . Sleep apnea   . GERD (gastroesophageal reflux disease)   . Obesity hypoventilation syndrome ([place]) 04/08/2014  . OSA (obstructive sleep apnea) 04/08/2014   Current Outpatient Prescriptions on File Prior to Visit  Medication Sig Dispense Refill  . [DISCONTINUED] LISINOPRIL-HYDROCHLOROTHIAZIDE PO Take by mouth.     No current facility-administered medications on file prior to visit.   Allergies  Allergen Reactions  . Chlorthalidone Nausea Only   Depression screen [facility] 2/9 07/30/2015  Decreased Interest 0  Down, Depressed, Hopeless 0  PHQ - 2 Score 0     Review of Systems  Constitutional: Positive for fever and activity change. Negative for chills, appetite change and unexpected weight change.  Respiratory: Negative for chest  tightness.   Cardiovascular: Negative for chest pain.  Endocrine: Negative for polydipsia, polyphagia and polyuria.  Musculoskeletal: Positive for back pain, joint swelling, arthralgias and gait problem. Negative for myalgias.  Skin: Positive for color change. Negative for rash and wound.  Neurological: Negative for weakness and numbness.  Hematological: Does not bruise/bleed easily.  Psychiatric/Behavioral: Negative for dysphoric mood.       Objective:  BP 168/98 mmHg  Pulse 78  Temp(Src) 98.7 F (37.1 C) (Oral)  Resp 16  Ht 6\' 2"  (1.88 m)  Wt 372 lb (168.738 kg)  BMI 47.74 kg/m2  SpO2 96%  Physical Exam  Constitutional: He is oriented to person, place, and time. He appears well-developed and well-nourished. No distress.  HENT:  Head: Normocephalic and atraumatic.  Mouth/Throat: Oropharynx is clear and moist. No oropharyngeal exudate.  Eyes: Pupils are equal, round, and reactive to light.  Neck: Neck supple.  Cardiovascular: Normal rate.   Pulses:      Dorsalis pedis pulses are 2+ on the left side.       Posterior tibial pulses are 2+ on the left side.  Pulmonary/Chest: Effort normal.  Musculoskeletal: He exhibits edema.  1-2+ non-pitting edema.  Neurological: He is alert and oriented to person, place, and time. No cranial nerve deficit.  Skin: Skin is warm and dry. There is erythema.  Erythema over 1st MTP. Warmth over medial distal aspect of left foot.  Nails are thickened and darkened with fungus. White scaling of skin consistent with tinea pedis. Small amount of maceration progressively worsening laterally; minimal amount between 1st and 2nd, severe amount between 4th and 5th. Hyperaesthetic 1st MTP.  Psychiatric: He has a normal mood and affect. His behavior is normal.  Nursing note and vitals reviewed.  Results for orders placed or performed in visit on 07/30/15  POCT CBC  Result Value Ref Range   WBC 9.9 4.6 - 10.2 K/uL   Lymph, poc 1.8 0.6 - 3.4   POC LYMPH  PERCENT 18.3 10 - 50 %L   MID (cbc) 0.6 0 - 0.9   POC MID % 6.5 0 - 12 %M   POC Granulocyte 7.4 (A) 2 - 6.9   Granulocyte percent 75.2 37 - 80 %G   RBC 5.12 4.69 - 6.13 M/uL   Hemoglobin 15.5 14.1 - 18.1 g/dL   HCT, POC 43.8 43.5 - 53.7 %   MCV 85.6 80 - 97 fL   MCH, POC 30.2 27 - 31.2 pg   MCHC 35.3 31.8 - 35.4 g/dL   RDW, POC 12.7 %   Platelet Count, POC 213 142 - 424 K/uL   MPV 7.0 0 - 99.8 fL  POCT glucose (manual entry)  Result Value Ref Range   POC Glucose 113 (A) 70 - 99 mg/dl         Assessment & Plan:   1. Cellulitis of toe of right foot   2. Inflammatory arthritis ([place]) - poss gout - reviewed dietary changes  3. Benign essential HTN   4. Morbid obesity, unspecified obesity type ([place])   5. Tinea pedis of right foot   6. Onychomycosis   7. Uncontrolled stage 2 hypertension     Orders Placed This Encounter  Procedures  . Comprehensive metabolic panel  . Uric Acid  . POCT CBC  . POCT SEDIMENTATION RATE  . POCT glucose (manual entry)    Meds ordered this encounter  Medications  . terbinafine (LAMISIL) 250 MG tablet    Sig: Take 1 tablet (250 mg total) by mouth daily.    Dispense:  14 tablet    Refill:  0  . nystatin (MYCOSTATIN) powder    Sig: Apply topically 4 (four) times daily. In between toes    Dispense:  60 g    Refill:  0  . terbinafine (LAMISIL) 1 % cream    Sig: Apply 1 application topically 2 (two) times daily. For at least 1 week and up to 1 month    Dispense:  42 g    Refill:  2  . indomethacin (INDOCIN) 50 MG capsule    Sig: Take 1 capsule (50 mg total) by mouth 3 (three) times daily with meals.    Dispense:  30 capsule    Refill:  0  . colchicine 0.6 MG tablet    Sig: Take 2 tabs po x 1 now, then 1 tab an hour later    Dispense:  3 tablet    Refill:  1  . DISCONTD: triamterene-hydrochlorothiazide (MAXZIDE-25) 37.5-25 MG tablet    Sig: Take 1 tablet by mouth daily.    Dispense:  30 tablet    Refill:  0  . cephALEXin (KEFLEX) 500 MG  capsule    Sig: Take 1 capsule (500 mg total) by mouth 4 (four) times daily.    Dispense:  28 capsule    Refill:  0  . oxyCODONE-acetaminophen (ROXICET) 5-325 MG tablet    Sig:  Take 1-2 tablets by mouth every 4 (four) hours as needed for severe pain.    Dispense:  20 tablet    Refill:  0  . furosemide (LASIX) 40 MG tablet    Sig: Take 1 tablet (40 mg total) by mouth daily.    Dispense:  30 tablet    Refill:  0    DO NOT DISPENSE MAXZIDE  . carvedilol (COREG) 12.5 MG tablet    Sig: Take 1 tablet (12.5 mg total) by mouth 2 (two) times daily with a meal.    Dispense:  180 tablet    Refill:  1  . lisinopril (PRINIVIL,ZESTRIL) 40 MG tablet    Sig: TAKE 1 TABLET BY MOUTH DAILY    Dispense:  90 tablet    Refill:  1    I personally performed the services described in this documentation, which was scribed in my presence. The recorded information has been reviewed and considered, and addended by me as needed.  Delman Cheadle, MD MPH  By signing my name below, I, Zola Button, attest that this documentation has been prepared under the direction and in the presence of Delman Cheadle, MD.  Electronically Signed: Zola Button, Medical Scribe. 07/30/2015. 11:06 AM.   Results for orders placed or performed in visit on 07/30/15  Comprehensive metabolic panel  Result Value Ref Range   Sodium 139 135 - 146 mmol/L   Potassium 4.3 3.5 - 5.3 mmol/L   Chloride 102 98 - 110 mmol/L   CO2 30 20 - 31 mmol/L   Glucose, Bld 112 (H) 65 - 99 mg/dL   BUN 16 7 - 25 mg/dL   Creat 1.22 0.60 - 1.35 mg/dL   Total Bilirubin 0.6 0.2 - 1.2 mg/dL   Alkaline Phosphatase 88 40 - 115 U/L   AST 15 10 - 40 U/L   ALT 18 9 - 46 U/L   Total Protein 7.1 6.1 - 8.1 g/dL   Albumin 4.0 3.6 - 5.1 g/dL   Calcium 8.9 8.6 - 10.3 mg/dL  Uric Acid  Result Value Ref Range   Uric Acid, Serum 7.1 4.0 - 7.8 mg/dL  POCT CBC  Result Value Ref Range   WBC 9.9 4.6 - 10.2 K/uL   Lymph, poc 1.8 0.6 - 3.4   POC LYMPH PERCENT 18.3 10 - 50 %L   MID  (cbc) 0.6 0 - 0.9   POC MID % 6.5 0 - 12 %M   POC Granulocyte 7.4 (A) 2 - 6.9   Granulocyte percent 75.2 37 - 80 %G   RBC 5.12 4.69 - 6.13 M/uL   Hemoglobin 15.5 14.1 - 18.1 g/dL   HCT, POC 43.8 43.5 - 53.7 %   MCV 85.6 80 - 97 fL   MCH, POC 30.2 27 - 31.2 pg   MCHC 35.3 31.8 - 35.4 g/dL   RDW, POC 12.7 %   Platelet Count, POC 213 142 - 424 K/uL   MPV 7.0 0 - 99.8 fL  POCT SEDIMENTATION RATE  Result Value Ref Range   POCT SED RATE 11 0 - 22 mm/hr  POCT glucose (manual entry)  Result Value Ref Range   POC Glucose 113 (A) 70 - 99 mg/dl

## 2015-11-19 ENCOUNTER — Other Ambulatory Visit: Payer: Self-pay | Admitting: Nurse Practitioner

## 2015-11-19 ENCOUNTER — Ambulatory Visit
Admission: RE | Admit: 2015-11-19 | Discharge: 2015-11-19 | Disposition: A | Discharge status: Home / Self Care | Payer: Worker's Compensation | Source: Ambulatory Visit | Attending: Nurse Practitioner | Admitting: Nurse Practitioner

## 2015-11-19 DIAGNOSIS — M25562 Pain in left knee: Secondary | ICD-10-CM

## 2015-11-19 IMAGING — CR DG KNEE COMPLETE 4+V*L*
5 series · 5 of 5 positions shown · non-contrast
Comparison: Left tibia and fibula radiographs [DATE]

CLINICAL DATA: Pain after twisting injury while walking down steps

EXAM:
LEFT KNEE - COMPLETE 4+ VIEW

[w knee ap left]
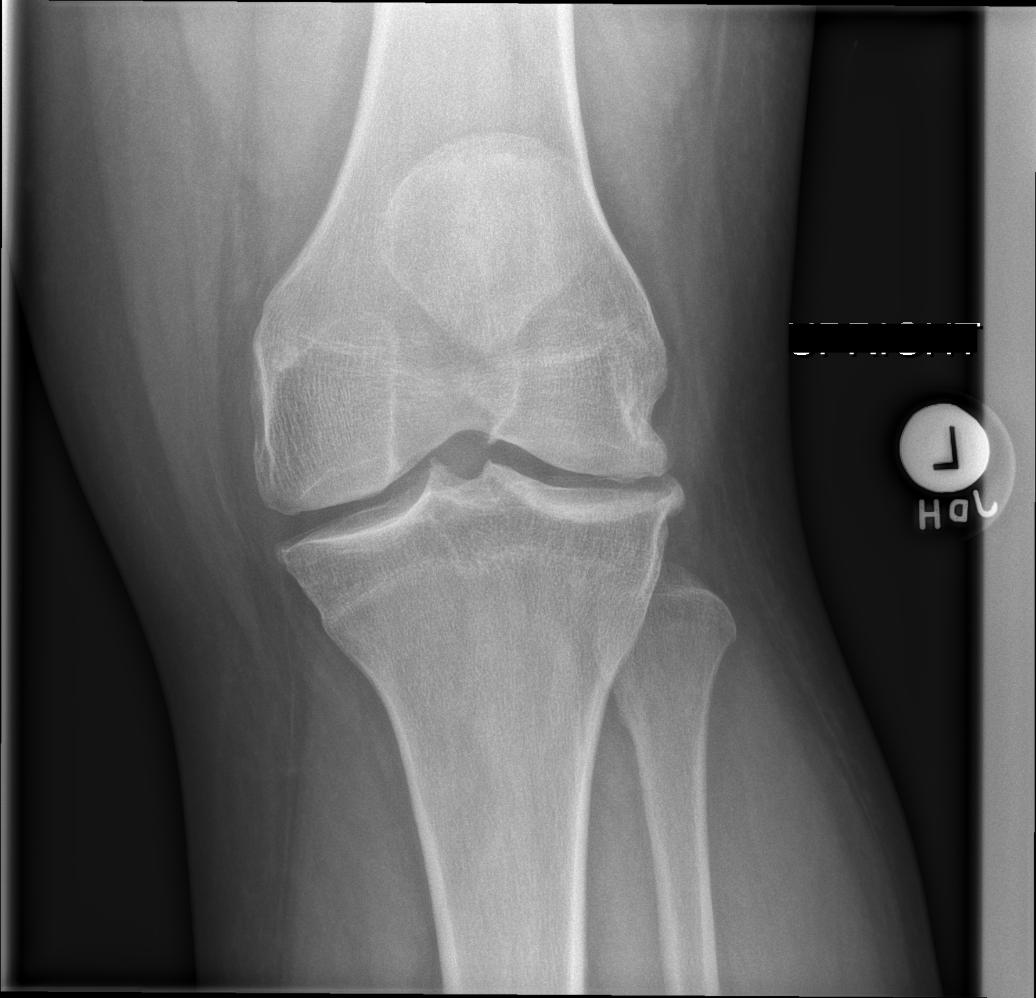

[w knee lat left]
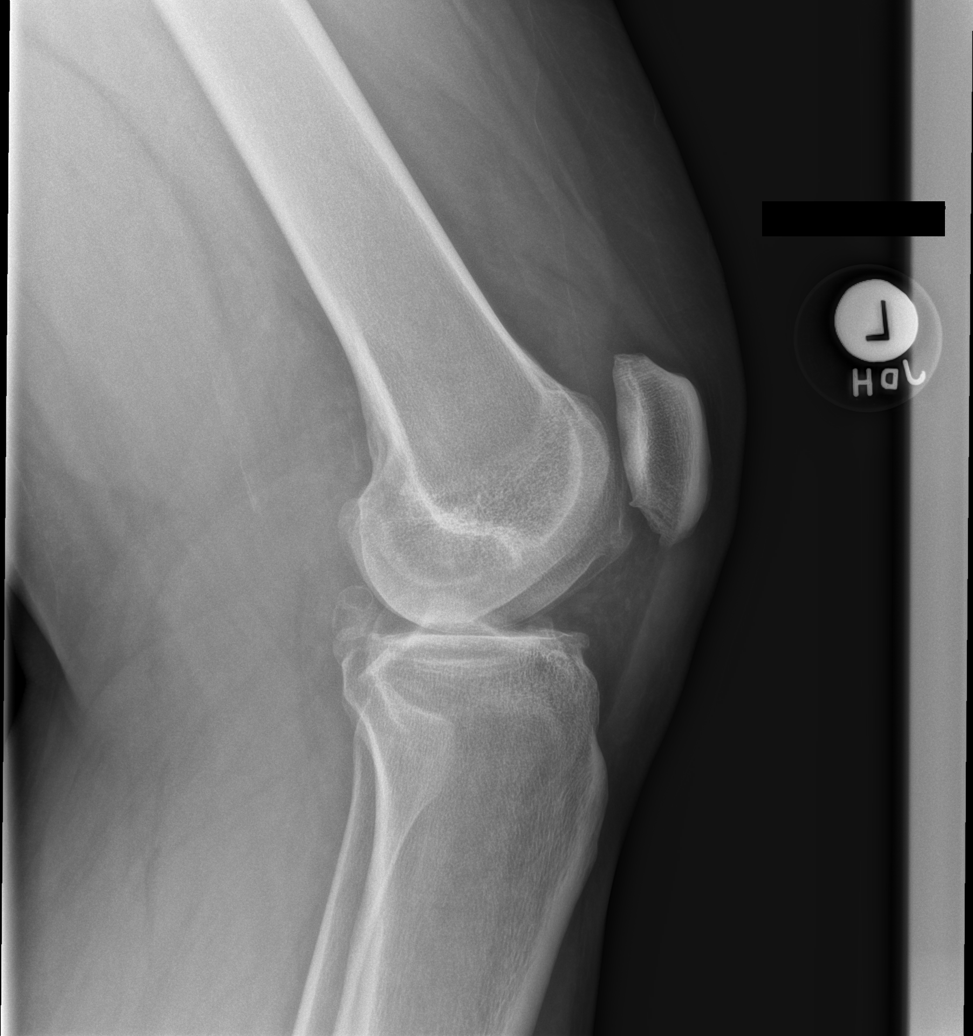

[x knee tunnel left]
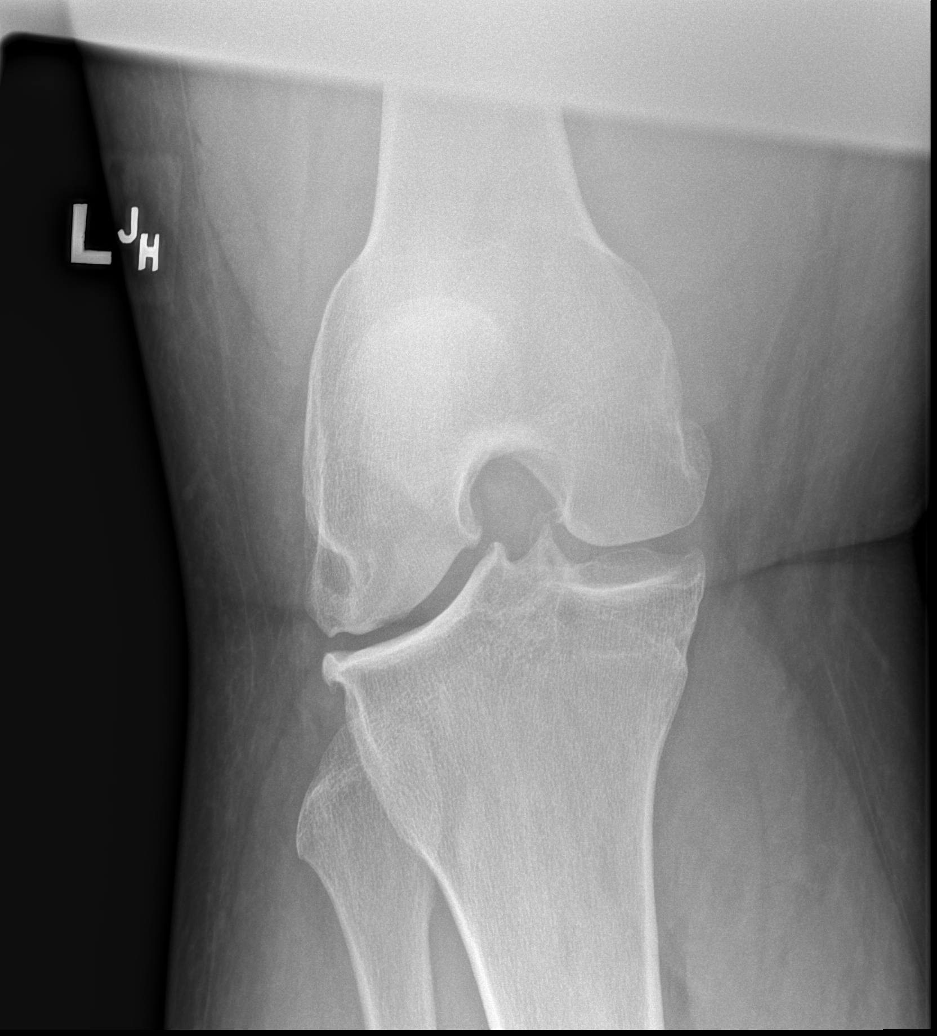

[x knee sunrise left (1 of 2)]
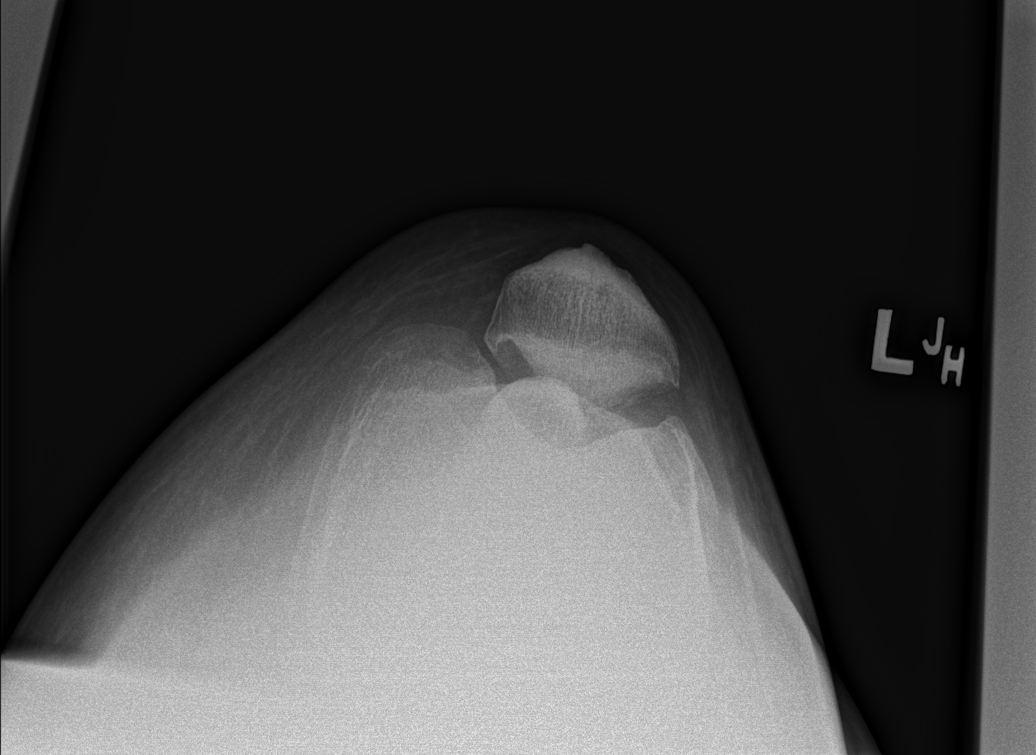

[x knee sunrise left (2 of 2)]
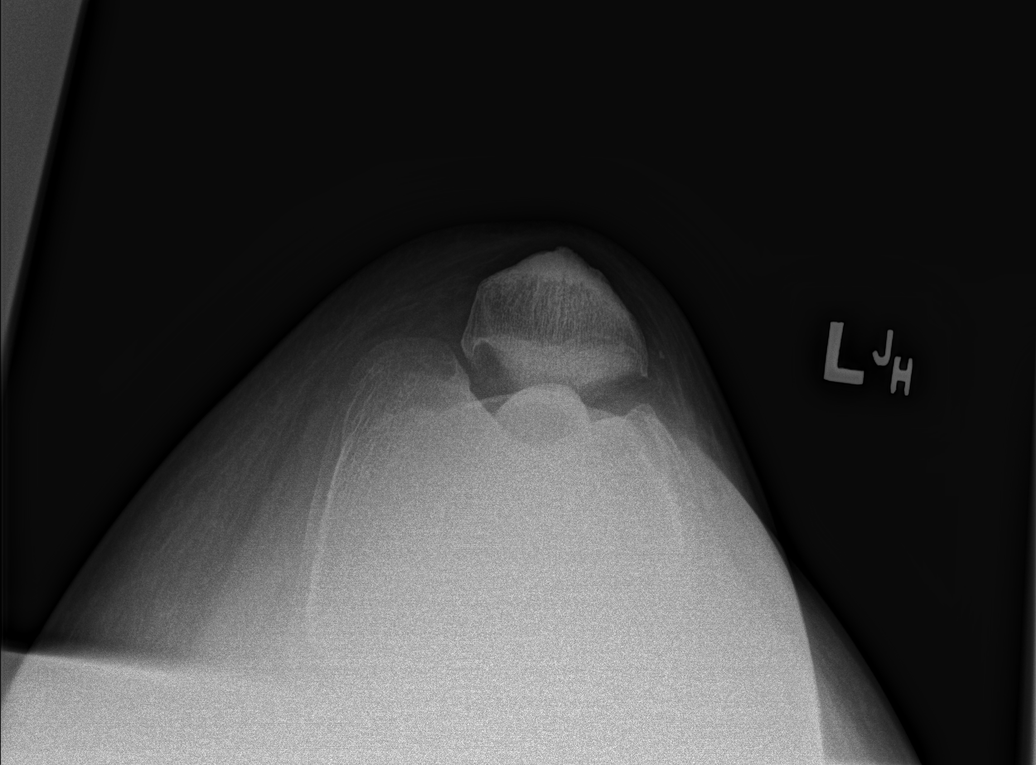

[5 of 5 positions shown; findings below may reference images not displayed]

FINDINGS: Upright frontal, upright lateral, tunnel, and sunrise patellar
images obtained. There is no fracture or dislocation. There is a
small joint effusion. There is narrowing in the lateral compartment,
moderate, with spurring laterally. There is milder spurring medially
and arising from the posterior patella. No erosive change.
IMPRESSION: Osteoarthritic change, most notably in the lateral compartment.
Small joint effusion. No demonstrable fracture or dislocation.

## 2016-03-15 ENCOUNTER — Emergency Department (HOSPITAL_COMMUNITY)
Admission: EM | Admit: 2016-03-15 | Discharge: 2016-03-15 | Disposition: A | Discharge status: Home / Self Care | Payer: Commercial Managed Care - HMO | Attending: Emergency Medicine | Admitting: Emergency Medicine

## 2016-03-15 ENCOUNTER — Emergency Department (HOSPITAL_COMMUNITY)
Admission: EM | Admit: 2016-03-15 | Discharge: 2016-03-15 | Discharge status: Absent without leave | Payer: Worker's Compensation

## 2016-03-15 ENCOUNTER — Encounter (HOSPITAL_COMMUNITY): Payer: Self-pay

## 2016-03-15 ENCOUNTER — Emergency Department (HOSPITAL_COMMUNITY): Payer: Commercial Managed Care - HMO

## 2016-03-15 DIAGNOSIS — Y939 Activity, unspecified: Secondary | ICD-10-CM | POA: Diagnosis not present

## 2016-03-15 DIAGNOSIS — Y999 Unspecified external cause status: Secondary | ICD-10-CM | POA: Diagnosis not present

## 2016-03-15 DIAGNOSIS — S6991XA Unspecified injury of right wrist, hand and finger(s), initial encounter: Secondary | ICD-10-CM

## 2016-03-15 DIAGNOSIS — F17210 Nicotine dependence, cigarettes, uncomplicated: Secondary | ICD-10-CM | POA: Diagnosis not present

## 2016-03-15 DIAGNOSIS — S8991XA Unspecified injury of right lower leg, initial encounter: Secondary | ICD-10-CM | POA: Diagnosis not present

## 2016-03-15 DIAGNOSIS — Z79899 Other long term (current) drug therapy: Secondary | ICD-10-CM | POA: Diagnosis not present

## 2016-03-15 DIAGNOSIS — I10 Essential (primary) hypertension: Secondary | ICD-10-CM | POA: Diagnosis not present

## 2016-03-15 DIAGNOSIS — Y92410 Unspecified street and highway as the place of occurrence of the external cause: Secondary | ICD-10-CM | POA: Diagnosis not present
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
## 2016-03-15 MED ORDER — NAPROXEN 500 MG PO TABS: 500.0000 mg | ORAL_TABLET | Freq: Two times a day (BID) | ORAL | Status: DC
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
## 2016-04-29 ENCOUNTER — Ambulatory Visit (INDEPENDENT_AMBULATORY_CARE_PROVIDER_SITE_OTHER): Payer: Self-pay | Admitting: Physician Assistant

## 2016-04-29 VITALS — BP 150/100 | HR 85 | Temp 98.7°F | Resp 18 | Ht 74.0 in | Wt 387.0 lb

## 2016-04-29 DIAGNOSIS — E291 Testicular hypofunction: Secondary | ICD-10-CM

## 2016-04-29 DIAGNOSIS — R7989 Other specified abnormal findings of blood chemistry: Secondary | ICD-10-CM

## 2016-04-29 DIAGNOSIS — G4733 Obstructive sleep apnea (adult) (pediatric): Secondary | ICD-10-CM

## 2016-04-29 DIAGNOSIS — I10 Essential (primary) hypertension: Secondary | ICD-10-CM | POA: Diagnosis not present

## 2016-04-29 DIAGNOSIS — M7989 Other specified soft tissue disorders: Secondary | ICD-10-CM

## 2016-04-29 LAB — CBC
HCT: 45.6 % (ref 38.5–50.0)
Hemoglobin: 15.4 g/dL (ref 13.2–17.1)
MCH: 29.5 pg (ref 27.0–33.0)
MCHC: 33.8 g/dL (ref 32.0–36.0)
MCV: 87.4 fL (ref 80.0–100.0)
MPV: 10 fL (ref 7.5–12.5)
PLATELETS: 257 10*3/uL (ref 140–400)
RBC: 5.22 MIL/uL (ref 4.20–5.80)
RDW: 13.1 % (ref 11.0–15.0)
WBC: 7.4 10*3/uL (ref 3.8–10.8)

## 2016-04-29 LAB — COMPREHENSIVE METABOLIC PANEL
ALT: 23 U/L (ref 9–46)
AST: 15 U/L (ref 10–40)
Albumin: 3.9 g/dL (ref 3.6–5.1)
Alkaline Phosphatase: 82 U/L (ref 40–115)
BILIRUBIN TOTAL: 0.5 mg/dL (ref 0.2–1.2)
BUN: 16 mg/dL (ref 7–25)
CHLORIDE: 106 mmol/L (ref 98–110)
CO2: 28 mmol/L (ref 20–31)
CREATININE: 1.22 mg/dL (ref 0.60–1.35)
Calcium: 9.1 mg/dL (ref 8.6–10.3)
Glucose, Bld: 118 mg/dL — ABNORMAL HIGH (ref 65–99)
Potassium: 4.1 mmol/L (ref 3.5–5.3)
SODIUM: 139 mmol/L (ref 135–146)
TOTAL PROTEIN: 7.1 g/dL (ref 6.1–8.1)

## 2016-04-29 LAB — TSH: TSH: 0.4 m[IU]/L (ref 0.40–4.50)

## 2016-04-29 MED ORDER — HYDROCHLOROTHIAZIDE 25 MG PO TABS: 25.0000 mg | ORAL_TABLET | Freq: Every day | ORAL | 5 refills | Status: DC

## 2016-04-29 NOTE — Progress Notes (Signed)
[facility] [address] [place] [address] [phone]  Date:  04/29/2016   Name:  ANTOIN MCCULLERS   DOB:  12-22-1980   MRN:  [id number]  PCP:  Delman Cheadle, MD    Chief Complaint: Hypertension and Leg Swelling (BOTH LEGS)   History of Present Illness:  This is a 35 y.o. male with PMH HTN who is presenting with elevated BP and bilateral leg swelling.   Leg swelling started 1 year ago. Staying about the same. Swelling improves over night when his legs are elevated. Swelling gets worse as the day goes on. No pain in legs. Denies CP or sob. No abdominal swelling. He currently takes lisinopril 40 mg and coreg 12.5 mg BID. He was on amlodipine in the past year and was stopped d/t his leg swelling however swelling stayed the same when he stopped. He is not on a diuretic. He had flu like symptoms 2 years ago with chlorthalidone. He was started on hctz after that and overall did fine but eventually stopped d/t thinking it was causing ED. He states his ED his not improve with stopping the hctz though. He eventually saw a urologist who dx'd him with low testosterone and gave him androgel which did help his ED. He is ok with starting back on the hctz today.  He does not check his BP at home. He does have a monitor but does not have a cuff that fits his machine.  He would like a refill of the androgel. He stopped using due to having a new baby and didn't want the androgel on his chest to get on the baby. He has not been back to see the urologist in some time. He did get androgel filled here at [facility] once by Dr. Ouida Sills.  Smoking about 1/2 ppd. He used to smoke 1 ppd. Trying to quit.  States he has not worn his CPAP in 1 month due to his machine being broken. States he is going to get it fixed today after he leaves here.  Review of Systems:  Review of Systems See HPI  Patient Active Problem List   Diagnosis Date Noted  . Hypogonadism male 07/12/2014  . Erectile dysfunction  due to arterial insufficiency 07/12/2014  . Obesity hypoventilation syndrome ([place]) 04/08/2014  . Obesity, morbid ([place]) 04/08/2014  . OSA (obstructive sleep apnea) 04/08/2014  . Tobacco use disorder 02/23/2011  . GERD (gastroesophageal reflux disease) 02/09/2011  . Benign essential HTN 07/21/2009    Prior to Admission medications   Medication Sig Start Date End Date Taking? Authorizing Provider  carvedilol (COREG) 12.5 MG tablet Take 1 tablet (12.5 mg total) by mouth 2 (two) times daily with a meal. 07/30/15  Yes Shawnee Knapp, MD  lisinopril (PRINIVIL,ZESTRIL) 40 MG tablet TAKE 1 TABLET BY MOUTH DAILY 07/30/15  Yes Shawnee Knapp, MD       Shawnee Knapp, MD       Shawnee Knapp, MD       Shawnee Knapp, MD       Lorayne Bender, PA-C       Shawnee Knapp, MD       Shawnee Knapp, MD    Allergies  Allergen Reactions  . Chlorthalidone Nausea Only    Past Surgical History:  Procedure Laterality Date  . two knee surgeries Left 1995    Social History  Substance Use Topics  . Smoking status: Current Every Day Smoker  Packs/day: 0.10    Years: 13.00    Types: Cigarettes  . Smokeless tobacco: Never Used  . Alcohol use 0.0 oz/week     Comment: occassional    Family History  Problem Relation Age of Onset  . Hypertension Mother   . Multiple sclerosis Mother   . Hypertension Father   . Hypertension Brother   . Hyperlipidemia Paternal Grandfather   . Heart failure Paternal Grandfather   . Diabetes Maternal Grandmother   . Heart failure Maternal Grandfather     Medication list has been reviewed and updated.  Physical Examination:  Physical Exam  Constitutional: He is oriented to person, place, and time. He appears well-developed and well-nourished. No distress.  HENT:  Head: Normocephalic and atraumatic.  Right Ear: Hearing normal.  Left Ear: Hearing normal.  Nose: Nose normal.  Eyes: Conjunctivae and lids are normal. Right eye exhibits no discharge. Left eye exhibits no discharge. No scleral  icterus.  Neck: Carotid bruit is not present.  Cardiovascular: Normal rate, regular rhythm, normal heart sounds and normal pulses.   No murmur heard. Pulmonary/Chest: Effort normal and breath sounds normal. No respiratory distress. He has no wheezes. He has no rhonchi. He has no rales.  Musculoskeletal: Normal range of motion.  Neurological: He is alert and oriented to person, place, and time.  Skin: Skin is warm, dry and intact. No lesion and no rash noted.  Bilateral LE with moderate swelling from ankles to upper shin, nonpitting. DP pulses 2+ and symmetric. Non-tender.  Psychiatric: He has a normal mood and affect. His speech is normal and behavior is normal. Thought content normal.    BP (!) 150/100   Pulse 85   Temp 98.7 F (37.1 C) (Oral)   Resp 18   Ht 6\' 2"  (1.88 m)   Wt (!) 387 lb (175.5 kg)   SpO2 97%   BMI 49.69 kg/m   Assessment and Plan:  1. Uncontrolled hypertension 2. Leg swelling Pt needs to be on a diuretic for his swelling and BP. Sounds like hctz worked well for him in the past, was stopped d/t ED, which ended up still be present after hctz d/c. Start back on hctz 25 mg. We discussed limiting salt. We discussed at length the importance of regular exercise, diet and losing weight. Return in 2 months for f/u. - CBC - Comprehensive metabolic panel - hydrochlorothiazide (HYDRODIURIL) 25 MG tablet; Take 1 tablet (25 mg total) by mouth daily.  Dispense: 30 tablet; Refill: 5  3. Morbid obesity, unspecified obesity type ([place]) - Hemoglobin A1c - TSH  4. Low testosterone Recheck testosterone. If still low, will send in rx for androgel since worked well for his ED in the past. - Testosterone Total,Free,Bio, Males  5. OSA on CPAP He will go get machine fixed today and we discussed importance of compliance.   Benjaman Pott Drenda Freeze, MHS [facility]  04/30/2016

## 2016-04-29 NOTE — Patient Instructions (Addendum)
Continue lisinopril and carvedilol. Start on hydrochlorithiazide daily in the morning Eat less processed foods, more foods cooked at home. Eat more vegetables and lean meat. Watch your sodium intake, no more 2.5 gm per day. Try to exercise outside of work and work on your weight loss. Keep cutting back on your cigarettes. Start back using your CPAP every night. Return in 2 months for follow up. Take your blood pressure at home and keep a record. I will let you know the results of your tests -- if testosterone still low, will send in androgel.     IF you received an x-ray today, you will receive an invoice from [facility] Radiology. Please contact [facility] Radiology at [phone] with questions or concerns regarding your invoice.   IF you received labwork today, you will receive an invoice from [employer]. Please contact [facility] at [phone] with questions or concerns regarding your invoice.   Our billing staff will not be able to assist you with questions regarding bills from these companies.  You will be contacted with the lab results as soon as they are available. The fastest way to get your results is to activate your My Chart account. Instructions are located on the last page of this paperwork. If you have not heard from [place] regarding the results in 2 weeks, please contact this office.

## 2016-04-30 LAB — TESTOSTERONE TOTAL,FREE,BIO, MALES
Albumin: 4.1 g/dL (ref 3.6–5.1)
SEX HORMONE BINDING: 10 nmol/L (ref 10–50)
TESTOSTERONE FREE: 59.9 pg/mL (ref 47.0–244.0)
TESTOSTERONE: 212 ng/dL — AB (ref 250–827)
Testosterone, Bioavailable: 112.8 ng/dL — ABNORMAL LOW (ref 130.5–681.7)

## 2016-04-30 LAB — HEMOGLOBIN A1C
HEMOGLOBIN A1C: 5.3 % (ref <5.7)
MEAN PLASMA GLUCOSE: 105 mg/dL

## 2016-05-07 ENCOUNTER — Telehealth: Payer: Self-pay | Admitting: Physician Assistant

## 2016-05-07 MED ORDER — TESTOSTERONE 20.25 MG/1.25GM (1.62%) TD GEL: TRANSDERMAL | 5 refills | Status: DC

## 2016-05-07 NOTE — Telephone Encounter (Signed)
Pt still with low testosterone. I have refilled the androgel that he has been on in the past. He will need to return in 2 months for recheck, he needs to make sure to come first thing in the morning and come fasting. Will recheck testosterone and check his cholesterol and f/u on his BP as well.   All other lab results from last visit were normal.

## 2016-05-10 NOTE — Telephone Encounter (Signed)
Pt advised.

## 2016-06-20 ENCOUNTER — Emergency Department (HOSPITAL_COMMUNITY)
Admission: EM | Admit: 2016-06-20 | Discharge: 2016-06-20 | Disposition: A | Discharge status: Home / Self Care | Payer: 59 | Attending: Dermatology | Admitting: Dermatology

## 2016-06-20 ENCOUNTER — Encounter (HOSPITAL_COMMUNITY): Payer: Self-pay | Admitting: Emergency Medicine

## 2016-06-20 DIAGNOSIS — Z79899 Other long term (current) drug therapy: Secondary | ICD-10-CM | POA: Insufficient documentation

## 2016-06-20 DIAGNOSIS — R109 Unspecified abdominal pain: Secondary | ICD-10-CM | POA: Diagnosis not present

## 2016-06-20 DIAGNOSIS — M7989 Other specified soft tissue disorders: Secondary | ICD-10-CM | POA: Diagnosis not present

## 2016-06-20 DIAGNOSIS — I10 Essential (primary) hypertension: Secondary | ICD-10-CM | POA: Diagnosis not present

## 2016-06-20 DIAGNOSIS — F17210 Nicotine dependence, cigarettes, uncomplicated: Secondary | ICD-10-CM | POA: Diagnosis not present

## 2016-06-20 DIAGNOSIS — Z5321 Procedure and treatment not carried out due to patient leaving prior to being seen by health care provider: Secondary | ICD-10-CM | POA: Diagnosis not present

## 2016-06-20 LAB — URINALYSIS, ROUTINE W REFLEX MICROSCOPIC
BILIRUBIN URINE: NEGATIVE
GLUCOSE, UA: NEGATIVE mg/dL
Hgb urine dipstick: NEGATIVE
KETONES UR: NEGATIVE mg/dL
Leukocytes, UA: NEGATIVE
Nitrite: NEGATIVE
PH: 7 (ref 5.0–8.0)
PROTEIN: NEGATIVE mg/dL
Specific Gravity, Urine: 1.024 (ref 1.005–1.030)

## 2016-06-20 NOTE — ED Notes (Signed)
Called pt 2x for room, no response

## 2016-06-20 NOTE — ED Triage Notes (Addendum)
Pt reports R flank pain for the past week. No dysuria or hematuria. No N/v/d. Pt also complains of bilateral leg swelling, was put on lasix by PCP for this. Denies SOB

## 2016-06-20 NOTE — ED Notes (Signed)
BP done on both arms.

## 2016-06-20 NOTE — ED Notes (Signed)
Pt called for 3rd time, no response

## 2016-07-19 ENCOUNTER — Other Ambulatory Visit: Payer: Self-pay | Admitting: Family Medicine

## 2016-07-19 DIAGNOSIS — I10 Essential (primary) hypertension: Secondary | ICD-10-CM

## 2016-07-21 ENCOUNTER — Ambulatory Visit (INDEPENDENT_AMBULATORY_CARE_PROVIDER_SITE_OTHER): Payer: Commercial Managed Care - HMO | Admitting: Family Medicine

## 2016-07-21 DIAGNOSIS — E291 Testicular hypofunction: Secondary | ICD-10-CM | POA: Diagnosis not present

## 2016-07-21 DIAGNOSIS — N5201 Erectile dysfunction due to arterial insufficiency: Secondary | ICD-10-CM | POA: Diagnosis not present

## 2016-07-21 DIAGNOSIS — I10 Essential (primary) hypertension: Secondary | ICD-10-CM

## 2016-07-21 MED ORDER — TESTOSTERONE 20.25 MG/1.25GM (1.62%) TD GEL: TRANSDERMAL | 5 refills | Status: DC

## 2016-07-21 MED ORDER — LISINOPRIL 40 MG PO TABS: 40.0000 mg | ORAL_TABLET | Freq: Every day | ORAL | 0 refills | Status: DC

## 2016-07-21 MED ORDER — CARVEDILOL 12.5 MG PO TABS: ORAL_TABLET | ORAL | 0 refills | Status: DC

## 2016-07-21 NOTE — Progress Notes (Signed)
   HPI  Patient presents today for follow-up hypertension.  Patient states he's run out of his medications for several days. When he checks at home his blood pressures often 704 to 888 systolic over 90 diastolic No chest pain,  palpitations, leg edema.  HI and HCTZ, he states this caused worsening erectile dysfunction.  He was evaluated for low testosterone last visit and states that he never got his prescription. He continues to have low sex drive and difficulty with erections.  Primary difficulty is getting an erection.  Obesity He is occupationally active, however no formal exercise routine He is not watching his diet.  PMH: Smoking status noted ROS: Per HPI  Objective: BP (!) 140/96 (BP Location: Right Arm, Patient Position: Sitting, Cuff Size: Large)   Pulse 69   Temp 98.5 F (36.9 C) (Oral)   Resp 17   Ht 6\' 1"  (1.854 m)   Wt (!) 399 lb (181 kg)   SpO2 97%   BMI 52.64 kg/m  Gen: NAD, alert, cooperative with exam HEENT: NCAT CV: RRR, good S1/S2, no murmur Resp: CTABL, no wheezes, non-labored Ext: 1-2+ pitting edema bilaterally, nontender Neuro: Alert and oriented, No gross deficits  Assessment and plan:  # Hypertension Elevated today, however patient is out of his medications Refilled lisinopril and Coreg, patient does not want to take HCTZ at this time, however he is willing to try a lower dose if he needs additional medications in the future. Consider chlorthalidone as an alternative thiazide a reticulocyte Discussed compression stockings for his edema.  # Obesity Discussed diet and exercise, recommended goal of 5% weight loss This would help his edema, breathing, and hypertension. Discussed diet modification specifically  # Hypogonadism With Erectile dysfunction Labs on 04/29/2016 show low testosterone, there does not appear to be any repeat test. Testosterone was prescribed today however he may need repeat testing for insurance purposes.    Meds  ordered this encounter  Medications  . lisinopril (PRINIVIL,ZESTRIL) 40 MG tablet    Sig: Take 1 tablet (40 mg total) by mouth daily.    Dispense:  90 tablet    Refill:  0  . carvedilol (COREG) 12.5 MG tablet    Sig: TAKE 1 TABLET(12.5 MG) BY MOUTH TWICE DAILY WITH A MEAL    Dispense:  180 tablet    Refill:  0  . Testosterone (ANDROGEL) 20.25 MG/1.25GM (1.62%) GEL    Sig: Two pumps on each shoulder daily    Dispense:  5 g    Refill:  5    Kenn File, MD 12:54 PM

## 2016-07-21 NOTE — Patient Instructions (Addendum)
   Great to meet you!  Please come back about 2 weeks before you need a refill of blood pressure meds.   Please call if you have any concerns    IF you received an x-ray today, you will receive an invoice from [facility] Radiology. Please contact [facility] Radiology at [phone] with questions or concerns regarding your invoice.   IF you received labwork today, you will receive an invoice from [employer]. Please contact [facility] at [phone] with questions or concerns regarding your invoice.   Our billing staff will not be able to assist you with questions regarding bills from these companies.  You will be contacted with the lab results as soon as they are available. The fastest way to get your results is to activate your My Chart account. Instructions are located on the last page of this paperwork. If you have not heard from [place] regarding the results in 2 weeks, please contact this office.

## 2016-09-10 ENCOUNTER — Other Ambulatory Visit: Payer: Self-pay | Admitting: Family Medicine

## 2016-09-10 ENCOUNTER — Other Ambulatory Visit: Payer: Self-pay | Admitting: Urgent Care

## 2016-09-10 DIAGNOSIS — I10 Essential (primary) hypertension: Secondary | ICD-10-CM

## 2016-11-08 ENCOUNTER — Emergency Department (HOSPITAL_COMMUNITY)
Admission: EM | Admit: 2016-11-08 | Discharge: 2016-11-09 | Disposition: A | Discharge status: Home / Self Care | Payer: Commercial Managed Care - HMO | Attending: Emergency Medicine | Admitting: Emergency Medicine

## 2016-11-08 ENCOUNTER — Encounter (HOSPITAL_COMMUNITY): Payer: Self-pay | Admitting: Emergency Medicine

## 2016-11-08 DIAGNOSIS — F17210 Nicotine dependence, cigarettes, uncomplicated: Secondary | ICD-10-CM | POA: Insufficient documentation

## 2016-11-08 DIAGNOSIS — Z79899 Other long term (current) drug therapy: Secondary | ICD-10-CM | POA: Diagnosis not present

## 2016-11-08 DIAGNOSIS — R600 Localized edema: Secondary | ICD-10-CM | POA: Insufficient documentation

## 2016-11-08 DIAGNOSIS — R0689 Other abnormalities of breathing: Secondary | ICD-10-CM | POA: Diagnosis not present

## 2016-11-08 DIAGNOSIS — M7989 Other specified soft tissue disorders: Secondary | ICD-10-CM | POA: Diagnosis not present

## 2016-11-08 DIAGNOSIS — I10 Essential (primary) hypertension: Secondary | ICD-10-CM | POA: Diagnosis not present

## 2016-11-08 DIAGNOSIS — R609 Edema, unspecified: Secondary | ICD-10-CM

## 2016-11-08 NOTE — ED Triage Notes (Signed)
Pt states he has bilateral leg swelling for the past year but for the past two months he has had pain in the left leg

## 2016-11-09 LAB — CBC WITH DIFFERENTIAL/PLATELET
BASOS ABS: 0 10*3/uL (ref 0.0–0.1)
BASOS PCT: 0 %
EOS PCT: 4 %
Eosinophils Absolute: 0.5 10*3/uL (ref 0.0–0.7)
HEMATOCRIT: 43.7 % (ref 39.0–52.0)
Hemoglobin: 15.3 g/dL (ref 13.0–17.0)
LYMPHS PCT: 26 %
Lymphs Abs: 2.9 10*3/uL (ref 0.7–4.0)
MCH: 29.8 pg (ref 26.0–34.0)
MCHC: 35 g/dL (ref 30.0–36.0)
MCV: 85.2 fL (ref 78.0–100.0)
Monocytes Absolute: 0.6 10*3/uL (ref 0.1–1.0)
Monocytes Relative: 6 %
NEUTROS ABS: 7.1 10*3/uL (ref 1.7–7.7)
Neutrophils Relative %: 64 %
PLATELETS: 220 10*3/uL (ref 150–400)
RBC: 5.13 MIL/uL (ref 4.22–5.81)
RDW: 12.7 % (ref 11.5–15.5)
WBC: 11.2 10*3/uL — AB (ref 4.0–10.5)

## 2016-11-09 LAB — URINALYSIS, ROUTINE W REFLEX MICROSCOPIC
BILIRUBIN URINE: NEGATIVE
GLUCOSE, UA: NEGATIVE mg/dL
HGB URINE DIPSTICK: NEGATIVE
Ketones, ur: NEGATIVE mg/dL
Leukocytes, UA: NEGATIVE
Nitrite: NEGATIVE
Protein, ur: NEGATIVE mg/dL
SPECIFIC GRAVITY, URINE: 1.018 (ref 1.005–1.030)
pH: 6 (ref 5.0–8.0)

## 2016-11-09 LAB — COMPREHENSIVE METABOLIC PANEL
ALBUMIN: 4.2 g/dL (ref 3.5–5.0)
ALT: 18 U/L (ref 17–63)
AST: 17 U/L (ref 15–41)
Alkaline Phosphatase: 74 U/L (ref 38–126)
Anion gap: 8 (ref 5–15)
BUN: 22 mg/dL — AB (ref 6–20)
CHLORIDE: 105 mmol/L (ref 101–111)
CO2: 24 mmol/L (ref 22–32)
CREATININE: 1.51 mg/dL — AB (ref 0.61–1.24)
Calcium: 8.9 mg/dL (ref 8.9–10.3)
GFR calc Af Amer: >60 mL/min (ref >60)
GFR, EST NON AFRICAN AMERICAN: 57 mL/min — AB (ref >60)
GLUCOSE: 99 mg/dL (ref 65–99)
POTASSIUM: 4 mmol/L (ref 3.5–5.1)
Sodium: 137 mmol/L (ref 135–145)
Total Bilirubin: 0.9 mg/dL (ref 0.3–1.2)
Total Protein: 7.6 g/dL (ref 6.5–8.1)

## 2016-11-09 LAB — BRAIN NATRIURETIC PEPTIDE: B Natriuretic Peptide: 85.4 pg/mL (ref 0.0–100.0)

## 2016-11-09 MED ORDER — FUROSEMIDE 20 MG PO TABS: 20.0000 mg | ORAL_TABLET | Freq: Every day | ORAL | 0 refills | Status: DC

## 2016-11-09 NOTE — ED Provider Notes (Signed)
[facility] DEPT Provider Note   CSN: [id number] Arrival date & time: 11/08/16  2051  By signing my name below, I, Oleh Genin, attest that this documentation has been prepared under the direction and in the presence of Orpah Greek, MD. Electronically Signed: Oleh Genin, Scribe. 11/09/16. 12:10 AM.   History   Chief Complaint Chief Complaint  Patient presents with  . Leg Swelling    HPI Calvin Bailey is a 36 y.o. male with history of hypertension and morbid obesity who presents to the ED for evaluation of peripheral edema. This patient states that he has experienced bilateral leg swelling for "2 years"; he has been taking diuretics prescribed by a PCP. However in the "last 2 months" his symptoms have worsened. The patient presents today stating that "his diuretics aren't worked". He denies any chest pain or dyspnea. He denies any other complaints at this time.  The history is provided by the patient. No language interpreter was used.    Past Medical History:  Diagnosis Date  . GERD (gastroesophageal reflux disease)   . HTN (hypertension)   . Obesity hypoventilation syndrome ([place]) 04/08/2014  . OSA (obstructive sleep apnea) 04/08/2014  . Sleep apnea     Patient Active Problem List   Diagnosis Date Noted  . Hypogonadism male 07/12/2014  . Erectile dysfunction due to arterial insufficiency 07/12/2014  . Obesity hypoventilation syndrome ([place]) 04/08/2014  . Obesity, morbid ([place]) 04/08/2014  . OSA (obstructive sleep apnea) 04/08/2014  . Tobacco use disorder 02/23/2011  . GERD (gastroesophageal reflux disease) 02/09/2011  . Benign essential HTN 07/21/2009    Past Surgical History:  Procedure Laterality Date  . two knee surgeries Left 1995       Home Medications    Prior to Admission medications   Medication Sig Start Date End Date Taking? Authorizing Provider  carvedilol (COREG) 12.5 MG tablet TAKE 1 TABLET(12.5 MG) BY MOUTH TWICE DAILY WITH A  MEAL 07/21/16  Yes Timmothy Euler, MD  lisinopril (PRINIVIL,ZESTRIL) 40 MG tablet Take 1 tablet (40 mg total) by mouth daily. 07/21/16  Yes Timmothy Euler, MD  carvedilol (COREG) 12.5 MG tablet TAKE 1 TABLET BY MOUTH TWICE DAILY WITH A MEAL Patient not taking: Reported on 11/08/2016 09/13/16   Shawnee Knapp, MD  furosemide (LASIX) 20 MG tablet Take 1 tablet (20 mg total) by mouth daily. 11/09/16   Orpah Greek, MD  Testosterone (ANDROGEL) 20.25 MG/1.25GM (1.62%) GEL Two pumps on each shoulder daily Patient not taking: Reported on 11/08/2016 07/21/16   Timmothy Euler, MD    Family History Family History  Problem Relation Age of Onset  . Hypertension Mother   . Multiple sclerosis Mother   . Hypertension Father   . Hypertension Brother   . Hyperlipidemia Paternal Grandfather   . Heart failure Paternal Grandfather   . Diabetes Maternal Grandmother   . Heart failure Maternal Grandfather     Social History Social History  Substance Use Topics  . Smoking status: Current Every Day Smoker    Packs/day: 0.10    Years: 13.00    Types: Cigarettes  . Smokeless tobacco: Never Used  . Alcohol use 0.0 oz/week     Comment: sparingly      Allergies   Chlorthalidone   Review of Systems Review of Systems  Cardiovascular: Positive for leg swelling.  All other systems reviewed and are negative.    Physical Exam Updated Vital Signs BP 165/95 (BP Location: Left Arm)   Pulse 64  Temp 98.9 F (37.2 C) (Oral)   Resp 18   Ht 6\' 2"  (1.88 m)   Wt (!) 384 lb (174.2 kg)   SpO2 100%   BMI 49.30 kg/m   Physical Exam  Constitutional: He is oriented to person, place, and time. He appears well-developed and well-nourished. No distress.  HENT:  Head: Normocephalic and atraumatic.  Right Ear: Hearing normal.  Left Ear: Hearing normal.  Nose: Nose normal.  Mouth/Throat: Oropharynx is clear and moist and mucous membranes are normal.  Eyes: Conjunctivae and EOM are normal.  Pupils are equal, round, and reactive to light.  Neck: Normal range of motion. Neck supple.  Cardiovascular: Regular rhythm, S1 normal and S2 normal.  Exam reveals no gallop and no friction rub.   No murmur heard. Pulses:      Dorsalis pedis pulses are 2+ on the right side, and 2+ on the left side.  There is bilateral non-pitting edema in the lower extremities without associated tenderness or erythema.   Pulmonary/Chest: Effort normal and breath sounds normal. No respiratory distress. He exhibits no tenderness.  Abdominal: Soft. Normal appearance and bowel sounds are normal. There is no hepatosplenomegaly. There is no tenderness. There is no rebound, no guarding, no tenderness at McBurney's point and negative Murphy's sign. No hernia.  Musculoskeletal: Normal range of motion.  Neurological: He is alert and oriented to person, place, and time. He has normal strength. No cranial nerve deficit or sensory deficit. Coordination normal. GCS eye subscore is 4. GCS verbal subscore is 5. GCS motor subscore is 6.  Skin: Skin is warm, dry and intact. No rash noted. No cyanosis.  Psychiatric: He has a normal mood and affect. His speech is normal and behavior is normal. Thought content normal.  Nursing note and vitals reviewed.    ED Treatments / Results  DIAGNOSTIC STUDIES: Oxygen Saturation is 95 percent on room air which is normal by my interpretation.    COORDINATION OF CARE: 12:05 AM Discussed treatment plan with pt at bedside and pt agreed to plan.  Labs (all labs ordered are listed, but only abnormal results are displayed) Labs Reviewed  CBC WITH DIFFERENTIAL/PLATELET - Abnormal; Notable for the following:       Result Value   WBC 11.2 (*)    All other components within normal limits  COMPREHENSIVE METABOLIC PANEL - Abnormal; Notable for the following:    BUN 22 (*)    Creatinine, Ser 1.51 (*)    GFR calc non Af Amer 57 (*)    All other components within normal limits  URINALYSIS,  ROUTINE W REFLEX MICROSCOPIC  BRAIN NATRIURETIC PEPTIDE    EKG  EKG Interpretation None       Radiology No results found.  Procedures Procedures (including critical care time)  Medications Ordered in ED Medications - No data to display   Initial Impression / Assessment and Plan / ED Course  I have reviewed the triage vital signs and the nursing notes.  Pertinent labs & imaging results that were available during my care of the patient were reviewed by me and considered in my medical decision making (see chart for details).     Patient presents with concerns over swelling of both of his legs. He reports that his been going on for more than a year but last couple of months and has been getting worse. He has diffuse nonpitting edema of both lower extremities which is symmetric. No overlying skin changes to suggest infection. He has distal pulses  that are palpable. Workup does not suggest renal failure, nephritis or congestive heart failure. Patient currently taking hydrochlorothiazide. This will be stopped 5 days, use low-dose Lasix and follow-up with PCP.  Final Clinical Impressions(s) / ED Diagnoses   Final diagnoses:  Peripheral edema  Leg swelling    New Prescriptions New Prescriptions   FUROSEMIDE (LASIX) 20 MG TABLET    Take 1 tablet (20 mg total) by mouth daily.  I personally performed the services described in this documentation, which was scribed in my presence. The recorded information has been reviewed and is accurate.    Orpah Greek, MD 11/09/16 [phone]

## 2016-11-09 NOTE — ED Notes (Signed)
Patient verbalizes understanding of discharge instructions, prescriptions, home care and follow up care. Patient out of department at this time. 

## 2017-01-26 ENCOUNTER — Other Ambulatory Visit: Payer: Self-pay | Admitting: Family Medicine

## 2017-02-15 DIAGNOSIS — R05 Cough: Secondary | ICD-10-CM | POA: Diagnosis not present

## 2017-02-15 DIAGNOSIS — A6001 Herpesviral infection of penis: Secondary | ICD-10-CM | POA: Insufficient documentation

## 2017-02-15 DIAGNOSIS — R062 Wheezing: Secondary | ICD-10-CM | POA: Diagnosis not present

## 2017-02-15 DIAGNOSIS — I517 Cardiomegaly: Secondary | ICD-10-CM | POA: Diagnosis not present

## 2017-02-15 DIAGNOSIS — R0602 Shortness of breath: Secondary | ICD-10-CM | POA: Diagnosis not present

## 2017-04-02 DIAGNOSIS — I10 Essential (primary) hypertension: Secondary | ICD-10-CM | POA: Diagnosis not present

## 2017-04-02 DIAGNOSIS — T887XXA Unspecified adverse effect of drug or medicament, initial encounter: Secondary | ICD-10-CM | POA: Diagnosis not present

## 2017-04-04 ENCOUNTER — Ambulatory Visit (INDEPENDENT_AMBULATORY_CARE_PROVIDER_SITE_OTHER): Payer: 59

## 2017-04-04 ENCOUNTER — Encounter: Payer: Self-pay | Admitting: Physician Assistant

## 2017-04-04 ENCOUNTER — Ambulatory Visit (INDEPENDENT_AMBULATORY_CARE_PROVIDER_SITE_OTHER): Payer: 59 | Admitting: Physician Assistant

## 2017-04-04 VITALS — BP 165/80 | HR 88 | Temp 99.0°F | Resp 18 | Ht 74.0 in | Wt 395.4 lb

## 2017-04-04 DIAGNOSIS — Z716 Tobacco abuse counseling: Secondary | ICD-10-CM | POA: Diagnosis not present

## 2017-04-04 DIAGNOSIS — E291 Testicular hypofunction: Secondary | ICD-10-CM

## 2017-04-04 DIAGNOSIS — E662 Morbid (severe) obesity with alveolar hypoventilation: Secondary | ICD-10-CM | POA: Diagnosis not present

## 2017-04-04 DIAGNOSIS — Z720 Tobacco use: Secondary | ICD-10-CM | POA: Diagnosis not present

## 2017-04-04 DIAGNOSIS — R5383 Other fatigue: Secondary | ICD-10-CM

## 2017-04-04 DIAGNOSIS — R0600 Dyspnea, unspecified: Secondary | ICD-10-CM

## 2017-04-04 DIAGNOSIS — R0609 Other forms of dyspnea: Secondary | ICD-10-CM

## 2017-04-04 DIAGNOSIS — J9811 Atelectasis: Secondary | ICD-10-CM | POA: Diagnosis not present

## 2017-04-04 MED ORDER — NICOTINE 7 MG/24HR TD PT24
7.0000 mg | MEDICATED_PATCH | Freq: Every day | TRANSDERMAL | 0 refills | Status: DC
Start: 2017-04-04 — End: 2017-04-05

## 2017-04-04 MED ORDER — TESTOSTERONE 20.25 MG/1.25GM (1.62%) TD GEL: TRANSDERMAL | 1 refills | Status: DC

## 2017-04-04 MED ORDER — NICOTINE 14 MG/24HR TD PT24: 14.0000 mg | MEDICATED_PATCH | Freq: Every day | TRANSDERMAL | 0 refills | Status: DC

## 2017-04-04 MED ORDER — NICOTINE 21 MG/24HR TD PT24: MEDICATED_PATCH | TRANSDERMAL | 0 refills | Status: DC

## 2017-04-04 NOTE — Patient Instructions (Addendum)
Please return for your lab work during opening hours.  We open at 7:45am and you will be there for a labs only. Refilling your medication and nicoderm.   You can also contact 1.800.quitnow Steps to Quit Smoking Smoking tobacco can be bad for your health. It can also affect almost every organ in your body. Smoking puts you and people around you at risk for many serious long-lasting (chronic) diseases. Quitting smoking is hard, but it is one of the best things that you can do for your health. It is never too late to quit. What are the benefits of quitting smoking? When you quit smoking, you lower your risk for getting serious diseases and conditions. They can include:  Lung cancer or lung disease.  Heart disease.  Stroke.  Heart attack.  Not being able to have children (infertility).  Weak bones (osteoporosis) and broken bones (fractures).  If you have coughing, wheezing, and shortness of breath, those symptoms may get better when you quit. You may also get sick less often. If you are pregnant, quitting smoking can help to lower your chances of having a baby of low birth weight. What can I do to help me quit smoking? Talk with your doctor about what can help you quit smoking. Some things you can do (strategies) include:  Quitting smoking totally, instead of slowly cutting back how much you smoke over a period of time.  Going to in-person counseling. You are more likely to quit if you go to many counseling sessions.  Using resources and support systems, such as: ? Database administrator with a Social worker. ? Phone quitlines. ? Careers information officer. ? Support groups or group counseling. ? Text messaging programs. ? Mobile phone apps or applications.  Taking medicines. Some of these medicines may have nicotine in them. If you are pregnant or breastfeeding, do not take any medicines to quit smoking unless your doctor says it is okay. Talk with your doctor about counseling or other things  that can help you.  Talk with your doctor about using more than one strategy at the same time, such as taking medicines while you are also going to in-person counseling. This can help make quitting easier. What things can I do to make it easier to quit? Quitting smoking might feel very hard at first, but there is a lot that you can do to make it easier. Take these steps:  Talk to your family and friends. Ask them to support and encourage you.  Call phone quitlines, reach out to support groups, or work with a Social worker.  Ask people who smoke to not smoke around you.  Avoid places that make you want (trigger) to smoke, such as: ? Bars. ? Parties. ? Smoke-break areas at work.  Spend time with people who do not smoke.  Lower the stress in your life. Stress can make you want to smoke. Try these things to help your stress: ? Getting regular exercise. ? Deep-breathing exercises. ? Yoga. ? Meditating. ? Doing a body scan. To do this, close your eyes, focus on one area of your body at a time from head to toe, and notice which parts of your body are tense. Try to relax the muscles in those areas.  Download or buy apps on your mobile phone or tablet that can help you stick to your quit plan. There are many free apps, such as QuitGuide from the [employer] Office manager for Disease Control and Prevention). You can find more support from smokefree.gov and other  websites.  This information is not intended to replace advice given to you by your health care provider. Make sure you discuss any questions you have with your health care provider. Document Released: 07/10/2009 Document Revised: 05/11/2016 Document Reviewed: 01/28/2015 Elsevier Interactive Patient Education  2018 [employer].     IF you received an x-ray today, you will receive an invoice from [facility] Radiology. Please contact [facility] Radiology at [phone] with questions or concerns regarding your invoice.   IF you received labwork  today, you will receive an invoice from [place]. Please contact [facility] at [phone] with questions or concerns regarding your invoice.   Our billing staff will not be able to assist you with questions regarding bills from these companies.  You will be contacted with the lab results as soon as they are available. The fastest way to get your results is to activate your My Chart account. Instructions are located on the last page of this paperwork. If you have not heard from [place] regarding the results in 2 weeks, please contact this office.

## 2017-04-04 NOTE — Progress Notes (Signed)
PRIMARY CARE AT [place], [place] [address] [phone]  Date:  04/04/2017   Name:  Calvin Bailey   DOB:  [birth year]   MRN:  [id number]  PCP:  Shawnee Knapp, MD    History of Present Illness:  Calvin Bailey is a 36 y.o. male patient who presents to PCP with  Chief Complaint  Patient presents with  . Medication Refill    testosterone      Patient would like refill of the testosterone.  He has not had this for several months.  He states that he has noticed increased fatigue.  He also notes that he feels easily short of breath with walking short distances.  He thought that he had fever about 1 week ago.  These symptoms only started one week ago.  There is some chest discomfort associated.  No nausea, diaphoresis, or dizziness associated. He is not eating well.  Eats fastfood. Smoking  Sleeps with 2 pillows.  This is his norm. He has noticed some lower extremity swelling, but minimal No heavy coughing.  Non-productive.    Patient Active Problem List   Diagnosis Date Noted  . Hypogonadism male 07/12/2014  . Erectile dysfunction due to arterial insufficiency 07/12/2014  . Obesity hypoventilation syndrome ([place]) 04/08/2014  . Obesity, morbid ([place]) 04/08/2014  . OSA (obstructive sleep apnea) 04/08/2014  . Tobacco use disorder 02/23/2011  . GERD (gastroesophageal reflux disease) 02/09/2011  . Benign essential HTN 07/21/2009    Past Medical History:  Diagnosis Date  . GERD (gastroesophageal reflux disease)   . HTN (hypertension)   . Obesity hypoventilation syndrome ([place]) 04/08/2014  . OSA (obstructive sleep apnea) 04/08/2014  . Sleep apnea     Past Surgical History:  Procedure Laterality Date  . two knee surgeries Left 1995    Social History  Substance Use Topics  . Smoking status: Current Every Day Smoker    Packs/day: 0.10    Years: 13.00    Types: Cigarettes  . Smokeless tobacco: Never Used  . Alcohol use 0.0 oz/week     Comment: sparingly      Family History  Problem Relation Age of Onset  . Hypertension Mother   . Multiple sclerosis Mother   . Hypertension Father   . Hypertension Brother   . Hyperlipidemia Paternal Grandfather   . Heart failure Paternal Grandfather   . Diabetes Maternal Grandmother   . Heart failure Maternal Grandfather     Allergies  Allergen Reactions  . Chlorthalidone Nausea Only    Medication list has been reviewed and updated.  Current Outpatient Prescriptions on File Prior to Visit  Medication Sig Dispense Refill  . carvedilol (COREG) 12.5 MG tablet TAKE 1 TABLET(12.5 MG) BY MOUTH TWICE DAILY WITH A MEAL 180 tablet 0  . carvedilol (COREG) 12.5 MG tablet TAKE 1 TABLET BY MOUTH TWICE DAILY WITH A MEAL 180 tablet 0  . furosemide (LASIX) 20 MG tablet Take 1 tablet (20 mg total) by mouth daily. 5 tablet 0  . lisinopril (PRINIVIL,ZESTRIL) 40 MG tablet TAKE 1 TABLET(40 MG) BY MOUTH DAILY 90 tablet 0  . [DISCONTINUED] LISINOPRIL-HYDROCHLOROTHIAZIDE PO Take by mouth.     No current facility-administered medications on file prior to visit.     ROS ROS otherwise unremarkable unless listed above.  Physical Examination: BP (!) 165/80   Pulse 88   Temp 99 F (37.2 C) (Oral)   Resp 18   Ht 6\' 2"  (1.88 m)   Wt (!) 395 lb  6.4 oz (179.4 kg)   SpO2 96%   BMI 50.77 kg/m  Ideal Body Weight: Weight in (lb) to have BMI = 25: 194.3  Physical Exam  Constitutional: He is oriented to person, place, and time. He appears well-developed and well-nourished. No distress.  HENT:  Head: Normocephalic and atraumatic.  Eyes: Pupils are equal, round, and reactive to light. Conjunctivae and EOM are normal.  Cardiovascular: Normal rate and regular rhythm.   Pulmonary/Chest: Effort normal. No respiratory distress. He has wheezes (very faint expiratory wheezing diffusely).  Neurological: He is alert and oriented to person, place, and time.  Skin: Skin is warm and dry. He is not diaphoretic.  Psychiatric: He has  a normal mood and affect. His behavior is normal.     Assessment and Plan: Calvin Bailey is a 36 y.o. male who is here today fpr cc of testosterone refill, and dyspnea. Concern of heart failure prompted xray, which appears non-acute and without cardiomegaly according to read.   He is willing to quit smoking.  Advised picking a date and start to utilize patches.months. Will treat for possible pneumonia at this time.   Refilling androgel, can fill for 6 months, but confirm that he returned for testosterone labs, for additional refill.  Other fatigue - Plan: EKG 12-Lead, DG Chest 2 View  Encounter for smoking cessation counseling - Plan: nicotine (NICODERM CQ) 21 mg/24hr patch, nicotine (NICODERM CQ) 14 mg/24hr patch, nicotine (NICODERM CQ) 7 mg/24hr patch, DISCONTINUED: nicotine (NICODERM CQ) 21 mg/24hr patch, DISCONTINUED: nicotine (NICODERM CQ) 14 mg/24hr patch, DISCONTINUED: nicotine (NICODERM CQ) 7 mg/24hr patch  Hypogonadism in male - Plan: Lipid panel, Testosterone, Testosterone (ANDROGEL) 20.25 MG/1.25GM (1.62%) GEL, DISCONTINUED: Testosterone (ANDROGEL) 20.25 MG/1.25GM (1.62%) GEL  Dyspnea on exertion - Plan: DG Chest 2 View  Atelectasis - Plan: azithromycin (ZITHROMAX) 250 MG tablet, albuterol (PROVENTIL HFA;VENTOLIN HFA) 108 (90 Base) MCG/ACT inhaler  Ivar Drape, PA-C [facility] 7/13/20188:20 AM

## 2017-04-05 MED ORDER — TESTOSTERONE 20.25 MG/1.25GM (1.62%) TD GEL: TRANSDERMAL | 1 refills | Status: DC

## 2017-04-05 MED ORDER — NICOTINE 7 MG/24HR TD PT24: 7.0000 mg | MEDICATED_PATCH | Freq: Every day | TRANSDERMAL | 0 refills | Status: DC

## 2017-04-05 MED ORDER — ALBUTEROL SULFATE HFA 108 (90 BASE) MCG/ACT IN AERS: 2.0000 | INHALATION_SPRAY | RESPIRATORY_TRACT | 1 refills | Status: DC | PRN

## 2017-04-05 MED ORDER — NICOTINE 14 MG/24HR TD PT24: 14.0000 mg | MEDICATED_PATCH | Freq: Every day | TRANSDERMAL | 0 refills | Status: DC

## 2017-04-05 MED ORDER — AZITHROMYCIN 250 MG PO TABS: ORAL_TABLET | ORAL | 0 refills | Status: DC

## 2017-04-05 MED ORDER — NICOTINE 21 MG/24HR TD PT24: MEDICATED_PATCH | TRANSDERMAL | 0 refills | Status: DC

## 2017-04-23 DIAGNOSIS — J020 Streptococcal pharyngitis: Secondary | ICD-10-CM | POA: Diagnosis not present

## 2017-05-27 ENCOUNTER — Ambulatory Visit: Payer: 59

## 2017-05-27 ENCOUNTER — Ambulatory Visit (INDEPENDENT_AMBULATORY_CARE_PROVIDER_SITE_OTHER): Payer: 59

## 2017-05-27 ENCOUNTER — Ambulatory Visit (INDEPENDENT_AMBULATORY_CARE_PROVIDER_SITE_OTHER): Payer: 59 | Admitting: Family Medicine

## 2017-05-27 ENCOUNTER — Ambulatory Visit
Admission: RE | Admit: 2017-05-27 | Discharge: 2017-05-27 | Disposition: A | Discharge status: Home / Self Care | Payer: 59 | Source: Ambulatory Visit | Attending: Family Medicine | Admitting: Family Medicine

## 2017-05-27 ENCOUNTER — Encounter: Payer: Self-pay | Admitting: Family Medicine

## 2017-05-27 VITALS — BP 150/88 | HR 69 | Temp 98.3°F | Resp 18 | Ht 74.0 in | Wt 390.6 lb

## 2017-05-27 DIAGNOSIS — F17200 Nicotine dependence, unspecified, uncomplicated: Secondary | ICD-10-CM | POA: Diagnosis not present

## 2017-05-27 DIAGNOSIS — R042 Hemoptysis: Secondary | ICD-10-CM

## 2017-05-27 DIAGNOSIS — I10 Essential (primary) hypertension: Secondary | ICD-10-CM

## 2017-05-27 LAB — POCT URINALYSIS DIP (MANUAL ENTRY)
Bilirubin, UA: NEGATIVE
Blood, UA: NEGATIVE
Glucose, UA: NEGATIVE mg/dL
Ketones, POC UA: NEGATIVE mg/dL
Leukocytes, UA: NEGATIVE
Nitrite, UA: NEGATIVE
Protein Ur, POC: 100 mg/dL — AB
Spec Grav, UA: 1.025 (ref 1.010–1.025)
Urobilinogen, UA: 1 E.U./dL
pH, UA: 6.5 (ref 5.0–8.0)

## 2017-05-27 LAB — POC MICROSCOPIC URINALYSIS (UMFC): Mucus: ABSENT

## 2017-05-27 LAB — POCT CBC
Granulocyte percent: 71.6 %G (ref 37–80)
HCT, POC: 47.3 % (ref 43.5–53.7)
Hemoglobin: 15.9 g/dL (ref 14.1–18.1)
Lymph, poc: 1.9 (ref 0.6–3.4)
MCH, POC: 29.3 pg (ref 27–31.2)
MCHC: 33.6 g/dL (ref 31.8–35.4)
MCV: 87.3 fL (ref 80–97)
MID (cbc): 0.4 (ref 0–0.9)
MPV: 7.5 fL (ref 0–99.8)
POC Granulocyte: 5.9 (ref 2–6.9)
POC LYMPH PERCENT: 23 %L (ref 10–50)
POC MID %: 5.4 %M (ref 0–12)
Platelet Count, POC: 265 10*3/uL (ref 142–424)
RBC: 5.42 M/uL (ref 4.69–6.13)
RDW, POC: 13.1 %
WBC: 8.2 10*3/uL (ref 4.6–10.2)

## 2017-05-27 IMAGING — CR DG CHEST 2V
2 series · 2 of 2 positions shown · non-contrast
Comparison: Radiograph of same day.

CLINICAL DATA: Hemoptysis.

EXAM:
CHEST  2 VIEW

[w chest pa]
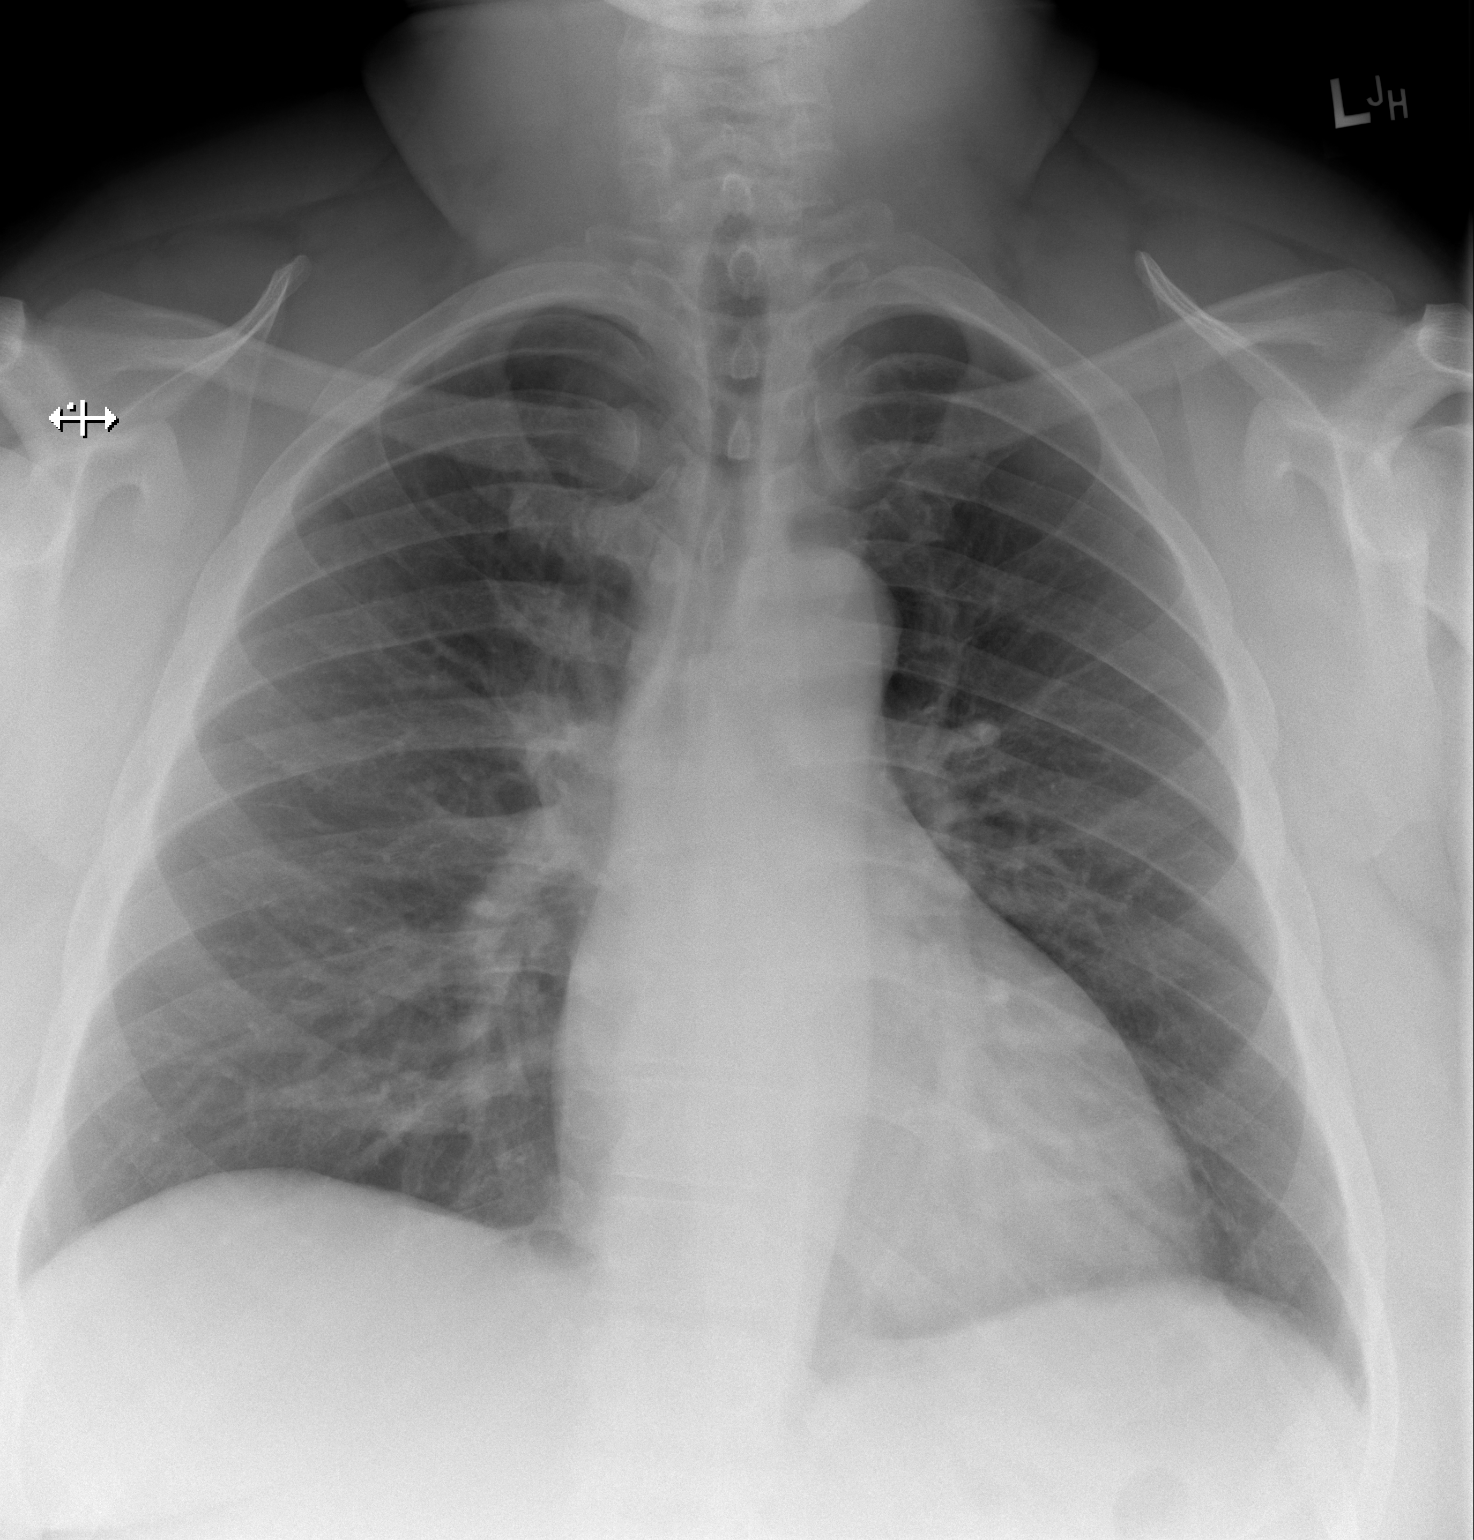

[w chest lat]
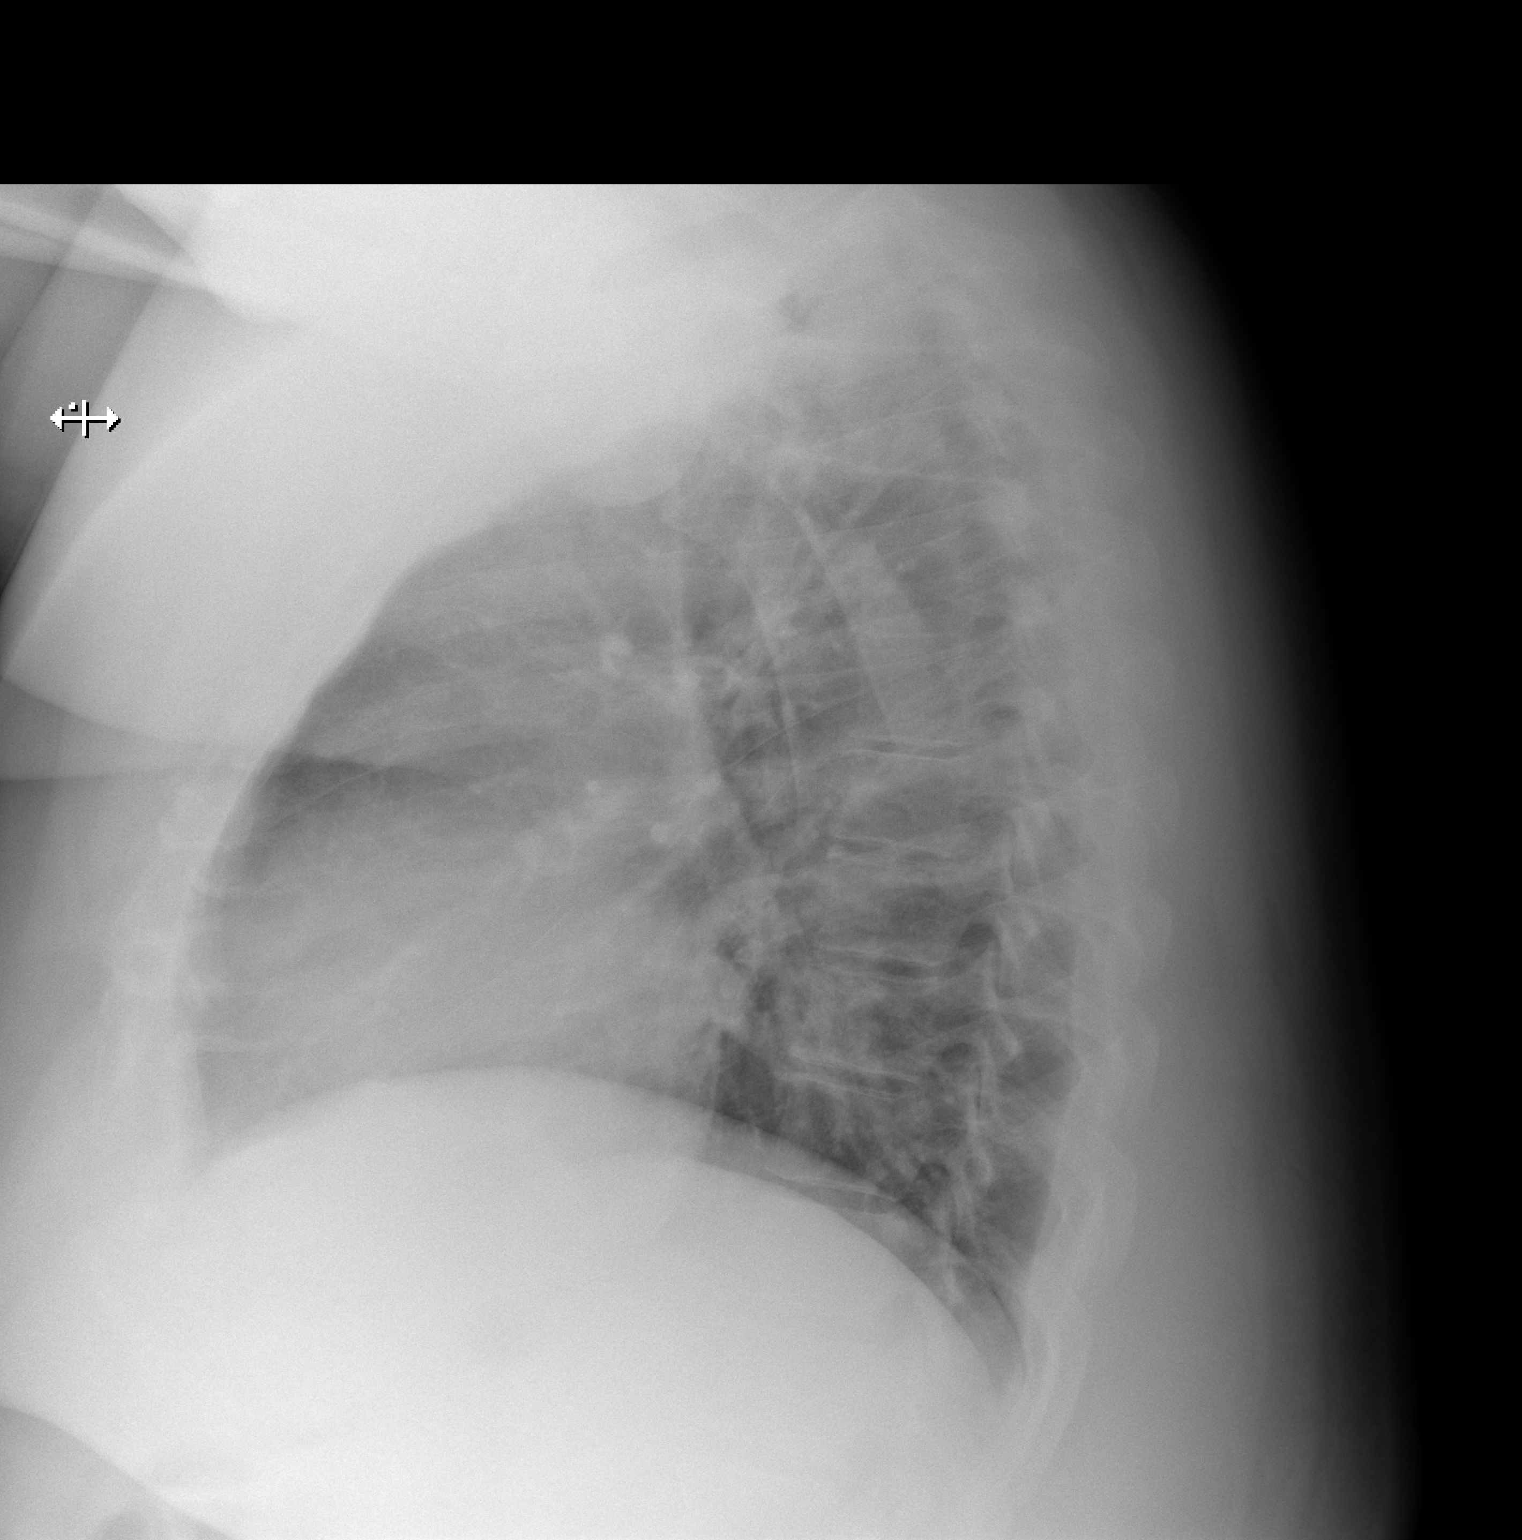

[2 of 2 positions shown; findings below may reference images not displayed]

FINDINGS: The heart size and mediastinal contours are within normal limits.
Both lungs are clear. No pneumothorax or pleural effusion is noted.
The visualized skeletal structures are unremarkable.
IMPRESSION: No active cardiopulmonary disease.

## 2017-05-27 IMAGING — DX DG CHEST 1V
1 series · 1 of 1 positions shown · non-contrast
Comparison: [DATE].

CLINICAL DATA: Hemoptysis.  Smoker.  Hypertension.

EXAM:
CHEST 1 VIEW

[chest pa]
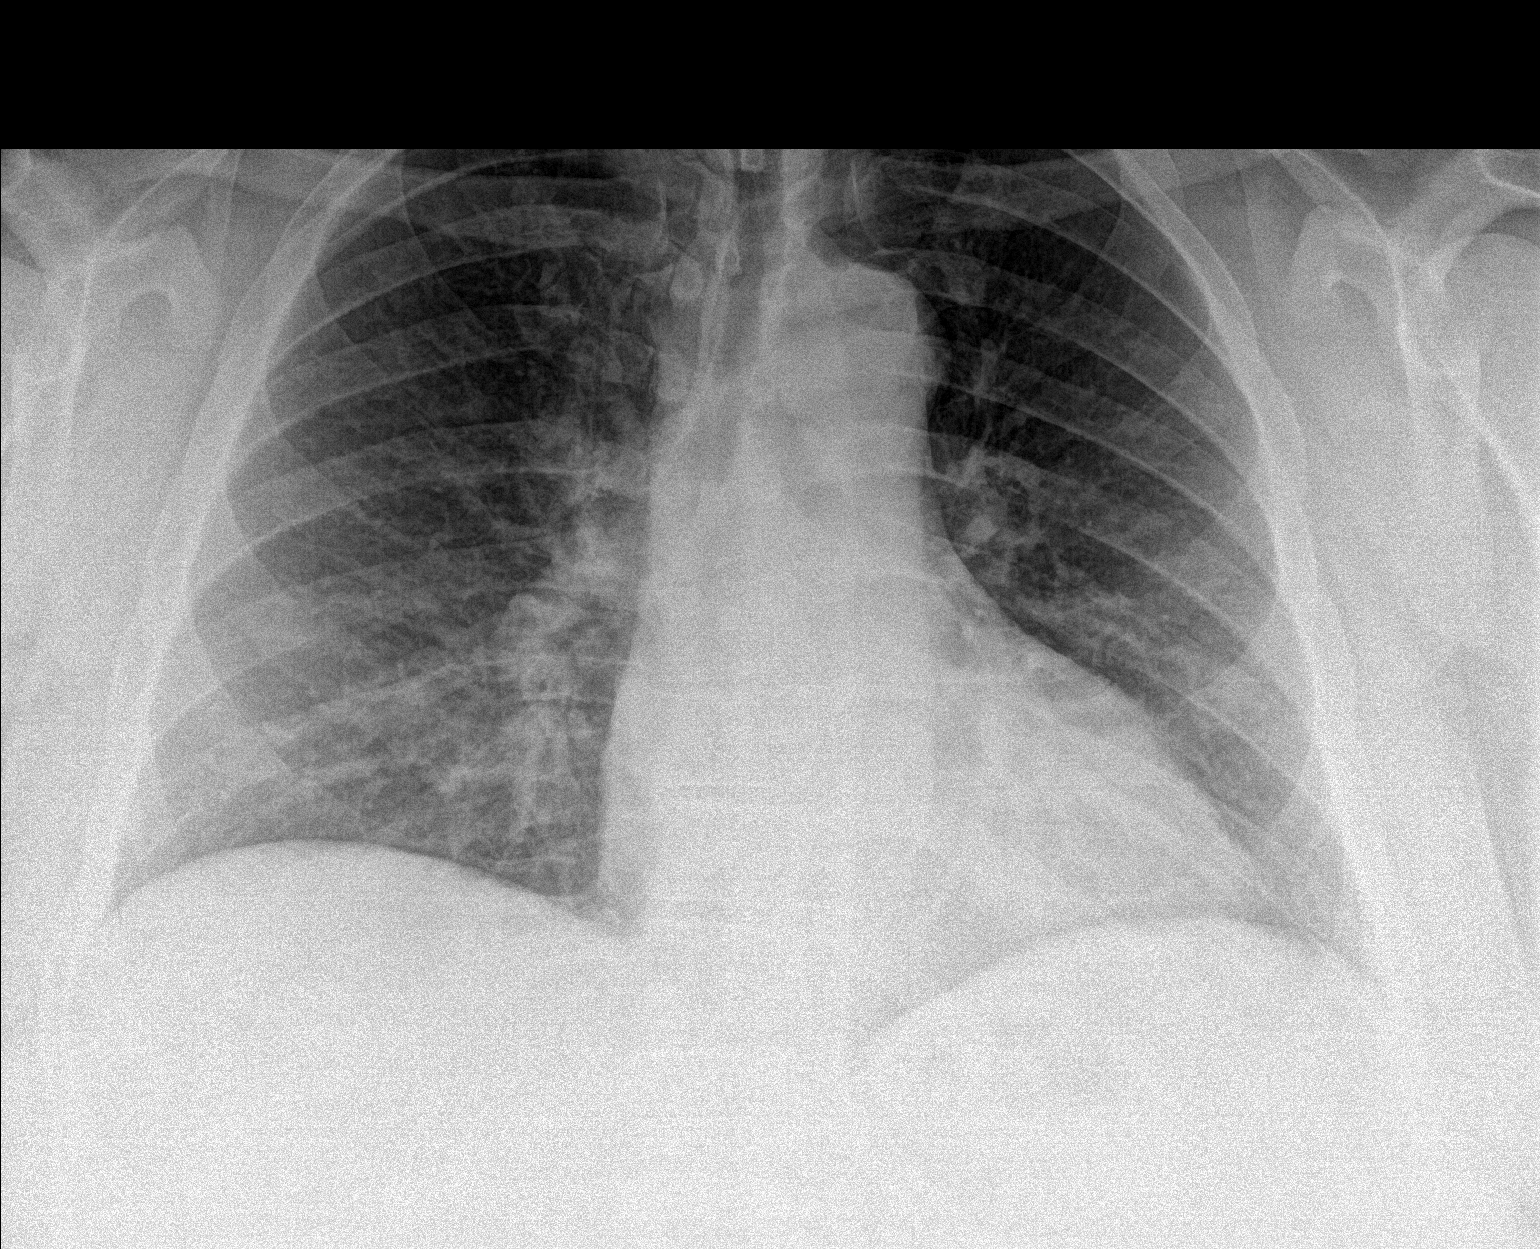

[1 of 1 positions shown; findings below may reference images not displayed]

FINDINGS: The heart remains normal in size. The lungs remain clear with normal
vascularity. Interval mild diffuse peribronchial thickening.
Unremarkable bones.
IMPRESSION: Interval mild bronchitic changes.

## 2017-05-27 MED ORDER — LEVOFLOXACIN 500 MG PO TABS
500.0000 mg | ORAL_TABLET | Freq: Every day | ORAL | 0 refills | Status: DC
Start: 2017-05-27 — End: 2017-05-28

## 2017-05-27 NOTE — Progress Notes (Signed)
8/31/201811:37 AM  Calvin Bailey May 17, 1981, 36 y.o. male [id number]  Chief Complaint  Patient presents with  . Cough    per patient with bloody sputum, began this morning    HPI:   Patient is a 36 y.o. male with past medical history significant for HTN who presents today for hemoptysis that started this morning, has had 2 episodes, enough to cover 3/4 sink. He denies any fever or chills, night sweats, denies any recent URI sx, he denies any bloody nose, nausea, vomiting, abd pain, changes in stool, blood in stool, black tarry stools, heartburn, blood in urine.   He has had this once before as presenting sx for pneumonia.  He smokes 1 ppd ~ 15 years  He has no known TB exposure  Has not taken his BP meds today  Depression screen [facility] 2/9 05/27/2017 04/04/2017 07/21/2016  Decreased Interest 0 0 0  Down, Depressed, Hopeless 0 0 0  PHQ - 2 Score 0 0 0    Allergies  Allergen Reactions  . Chlorthalidone Nausea Only    Current Outpatient Prescriptions on File Prior to Visit  Medication Sig Dispense Refill  . albuterol (PROVENTIL HFA;VENTOLIN HFA) 108 (90 Base) MCG/ACT inhaler Inhale 2 puffs into the lungs every 4 (four) hours as needed for wheezing or shortness of breath (cough, shortness of breath or wheezing.). 1 Inhaler 1  . azithromycin (ZITHROMAX) 250 MG tablet Take 2 tabs PO x 1 dose, then 1 tab PO QD x 4 days 6 tablet 0  . carvedilol (COREG) 12.5 MG tablet TAKE 1 TABLET(12.5 MG) BY MOUTH TWICE DAILY WITH A MEAL 180 tablet 0  . carvedilol (COREG) 12.5 MG tablet TAKE 1 TABLET BY MOUTH TWICE DAILY WITH A MEAL 180 tablet 0  . furosemide (LASIX) 20 MG tablet Take 1 tablet (20 mg total) by mouth daily. 5 tablet 0  . lisinopril (PRINIVIL,ZESTRIL) 40 MG tablet TAKE 1 TABLET(40 MG) BY MOUTH DAILY 90 tablet 0  . Testosterone (ANDROGEL) 20.25 MG/1.25GM (1.62%) GEL Two pumps on each shoulder daily 5 g 1  . nicotine (NICODERM CQ) 14 mg/24hr patch Place 1 patch (14 mg total) onto the  skin daily. (Patient not taking: Reported on 05/27/2017) 14 patch 0  . nicotine (NICODERM CQ) 21 mg/24hr patch Please apply patch daily for 6 weeks, then apply 14mg  patch for 2 weeks, then apply 7 mg patch daily for 2 weeks.  Start on quit date (Patient not taking: Reported on 05/27/2017) 42 patch 0  . nicotine (NICODERM CQ) 7 mg/24hr patch Place 1 patch (7 mg total) onto the skin daily. (Patient not taking: Reported on 05/27/2017) 14 patch 0  . [DISCONTINUED] LISINOPRIL-HYDROCHLOROTHIAZIDE PO Take by mouth.     No current facility-administered medications on file prior to visit.     Past Medical History:  Diagnosis Date  . GERD (gastroesophageal reflux disease)   . HTN (hypertension)   . Obesity hypoventilation syndrome ([place]) 04/08/2014  . OSA (obstructive sleep apnea) 04/08/2014  . Sleep apnea     Past Surgical History:  Procedure Laterality Date  . two knee surgeries Left 1995    Social History  Substance Use Topics  . Smoking status: Current Every Day Smoker    Packs/day: 1.00    Years: 15.00    Types: Cigarettes  . Smokeless tobacco: Never Used  . Alcohol use 0.0 oz/week     Comment: sparingly     Family History  Problem Relation Age of Onset  . Hypertension Mother   .  Multiple sclerosis Mother   . Hypertension Father   . Hypertension Brother   . Hyperlipidemia Paternal Grandfather   . Heart failure Paternal Grandfather   . Diabetes Maternal Grandmother   . Heart failure Maternal Grandfather     Review of Systems  Constitutional: Negative for chills, fever, malaise/fatigue and weight loss.  HENT: Negative for congestion, ear pain, nosebleeds and sore throat.   Respiratory: Positive for cough and hemoptysis. Negative for shortness of breath and wheezing.   Cardiovascular: Negative for chest pain, palpitations and leg swelling.  Gastrointestinal: Negative for abdominal pain, blood in stool, melena, nausea and vomiting.  Genitourinary: Negative for hematuria.  Skin:  Negative for rash.  Endo/Heme/Allergies: Does not bruise/bleed easily.     OBJECTIVE:  Blood pressure (!) 200/120, pulse 69, temperature 98.3 F (36.8 C), temperature source Oral, resp. rate 18, height 6\' 2"  (1.88 m), weight (!) 390 lb 9.6 oz (177.2 kg), SpO2 97 %. Repeat BP 150/88  Physical Exam  Constitutional: He is oriented to person, place, and time and well-developed, well-nourished, and in no distress.  HENT:  Head: Normocephalic and atraumatic.  Right Ear: Hearing, tympanic membrane, external ear and ear canal normal.  Left Ear: Hearing, tympanic membrane, external ear and ear canal normal.  Mouth/Throat: Oropharynx is clear and moist. No oropharyngeal exudate.  Nares with edema and polyps, no bleeding seen OP with enlarged tonsils, no blood seen  Eyes: Pupils are equal, round, and reactive to light. Conjunctivae and EOM are normal.  Neck: Neck supple. No tracheal deviation present. No thyromegaly present.  Cardiovascular: Normal rate, regular rhythm, normal heart sounds and intact distal pulses.  Exam reveals no gallop and no friction rub.   No murmur heard. Pulmonary/Chest: Effort normal and breath sounds normal. No stridor. He has no wheezes. He has no rales.  Abdominal: Soft. Bowel sounds are normal. He exhibits no mass. There is no tenderness.  Lymphadenopathy:    He has no cervical adenopathy.  Neurological: He is alert and oriented to person, place, and time. Gait normal.  Skin: Skin is warm and dry.       ASSESSMENT and PLAN:  1. Hemoptysis HD stable. Treating empirically with levaquin. Labs per below, Referring to Pulm. Strict ER precautions given - DG Chest 2 View - negative - POCT CBC - Comprehensive metabolic panel - POCT urinalysis dipstick - POCT Microscopic Urinalysis ([facility]) - Quantiferon tb gold assay - Mpo/pr-3 ([place]) antibodies  2. Benign essential HTN Repeat BP better, will defer adjusting meds at this time as patient anxious with current  situation - POCT CBC - Comprehensive metabolic panel - POCT urinalysis dipstick - POCT Microscopic Urinalysis ([facility])  3. Tobacco use disorder Encouraged cessation - POCT CBC - Comprehensive metabolic panel       Rutherford Guys, MD Primary Care at [place] [place], [place] [address] Ph.  [phone] Fax [phone]

## 2017-05-28 ENCOUNTER — Encounter (HOSPITAL_COMMUNITY): Payer: Self-pay | Admitting: Emergency Medicine

## 2017-05-28 ENCOUNTER — Emergency Department (HOSPITAL_COMMUNITY)
Admission: EM | Admit: 2017-05-28 | Discharge: 2017-05-28 | Disposition: A | Discharge status: Home / Self Care | Payer: 59 | Attending: Emergency Medicine | Admitting: Emergency Medicine

## 2017-05-28 ENCOUNTER — Emergency Department (HOSPITAL_COMMUNITY): Payer: 59

## 2017-05-28 DIAGNOSIS — Z79899 Other long term (current) drug therapy: Secondary | ICD-10-CM | POA: Diagnosis not present

## 2017-05-28 DIAGNOSIS — J189 Pneumonia, unspecified organism: Secondary | ICD-10-CM | POA: Diagnosis not present

## 2017-05-28 DIAGNOSIS — R042 Hemoptysis: Secondary | ICD-10-CM | POA: Diagnosis not present

## 2017-05-28 DIAGNOSIS — J181 Lobar pneumonia, unspecified organism: Secondary | ICD-10-CM | POA: Diagnosis not present

## 2017-05-28 DIAGNOSIS — F17210 Nicotine dependence, cigarettes, uncomplicated: Secondary | ICD-10-CM | POA: Insufficient documentation

## 2017-05-28 DIAGNOSIS — I10 Essential (primary) hypertension: Secondary | ICD-10-CM | POA: Insufficient documentation

## 2017-05-28 IMAGING — CT CT ANGIO CHEST
2 of 7 series · 18 of 36 positions shown · IV contrast (isovue)
Comparison: Two-view chest x-ray [DATE]. CTA of the chest
[DATE]

CLINICAL DATA: Hemoptysis.  Personal history of pneumonia.

EXAM:
CT ANGIOGRAPHY CHEST WITH CONTRAST
TECHNIQUE: Multidetector CT imaging of the chest was performed using the
standard protocol during bolus administration of intravenous
contrast. Multiplanar CT image reconstructions and MIPs were
obtained to evaluate the vascular anatomy.
CONTRAST:  100 mL Isovue 370

[Series 6: thins for pacs · axial · 0.98mm/px · z∈[-385,-135]mm · 17 of 278 slices shown]
[im 14/278  lung]
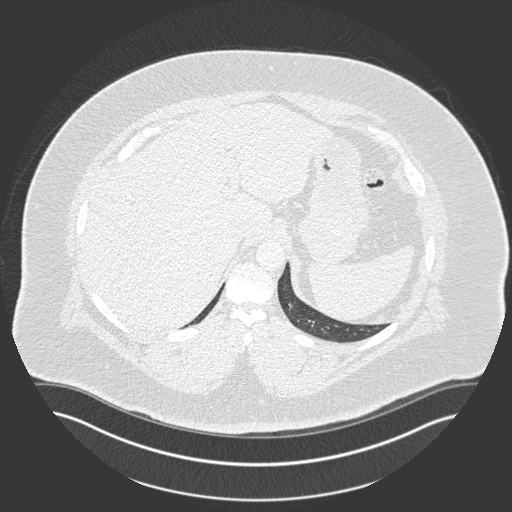
[im 28/278  mediastinal]
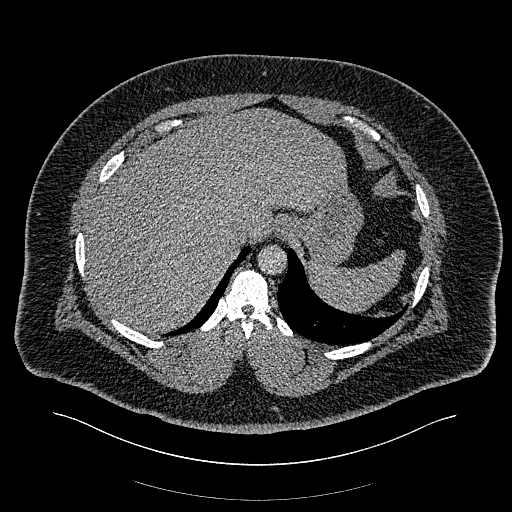
[im 42/278  lung]
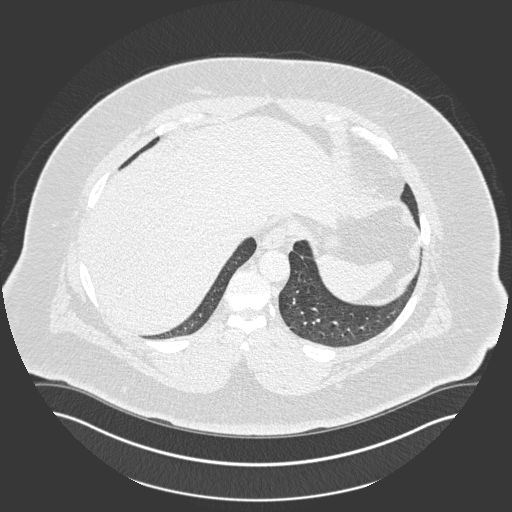
[im 56/278  mediastinal]
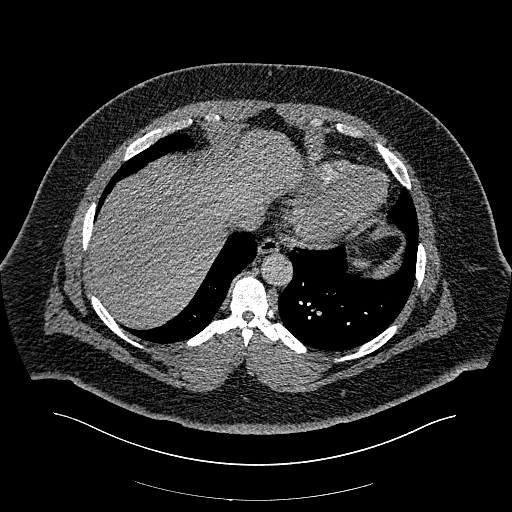
[im 84/278  lung]
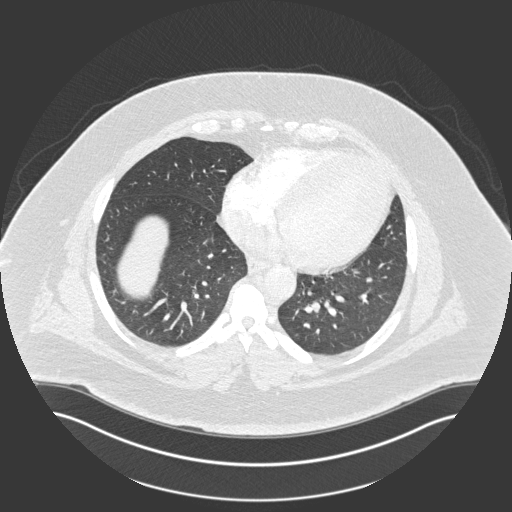
[im 97/278  mediastinal]
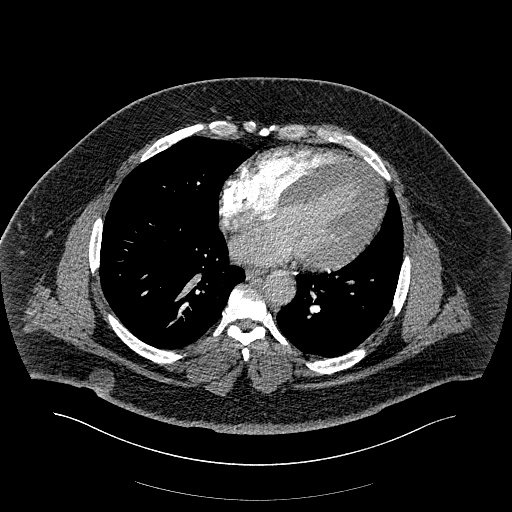
[im 111/278  lung]
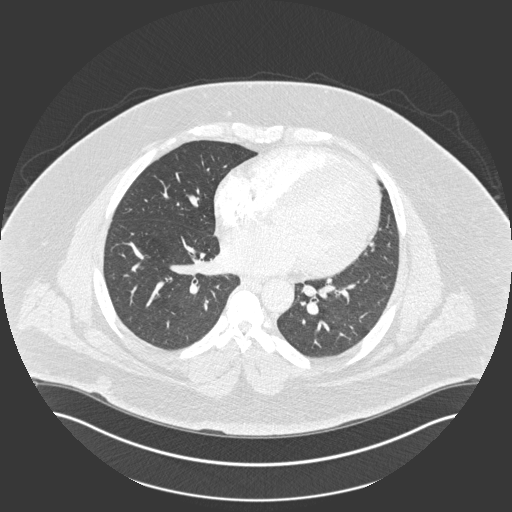
[im 125/278  mediastinal]
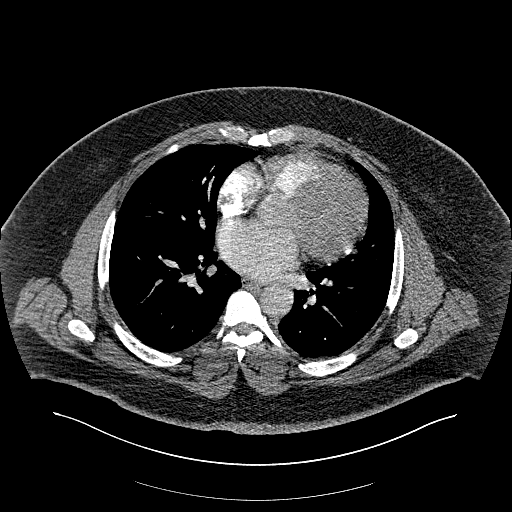
[im 139/278  lung]
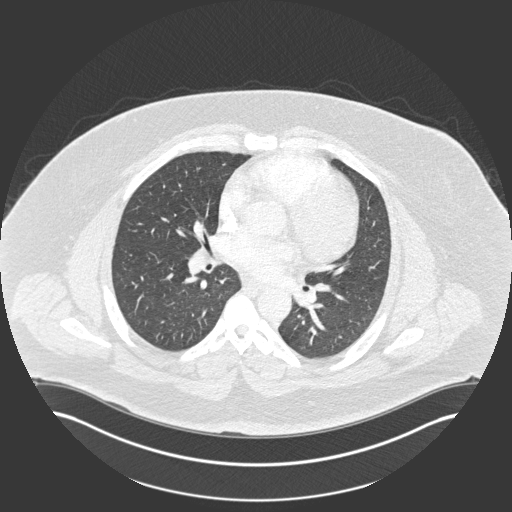
[im 153/278  mediastinal]
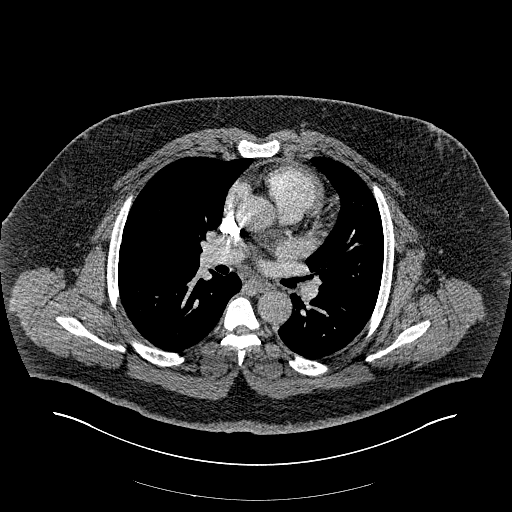
[im 167/278  lung]
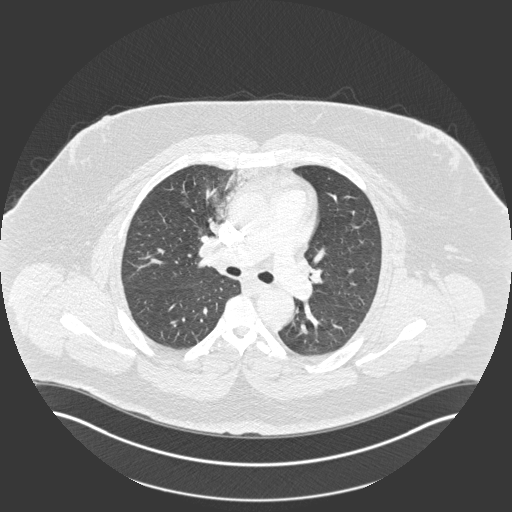
[im 181/278  mediastinal]
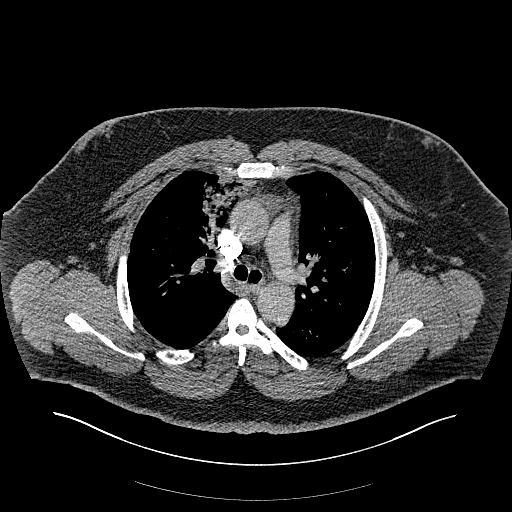
[im 194/278  lung]
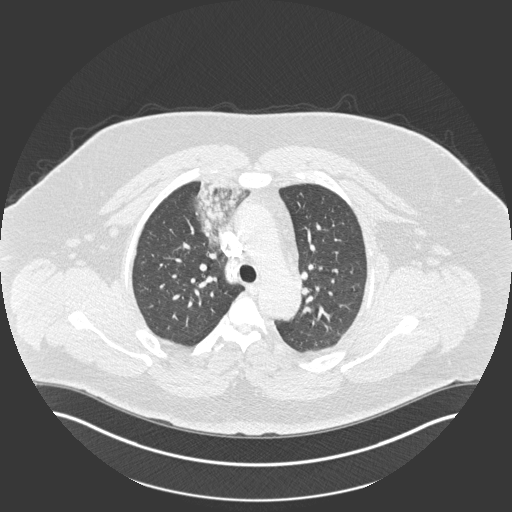
[im 222/278  mediastinal]
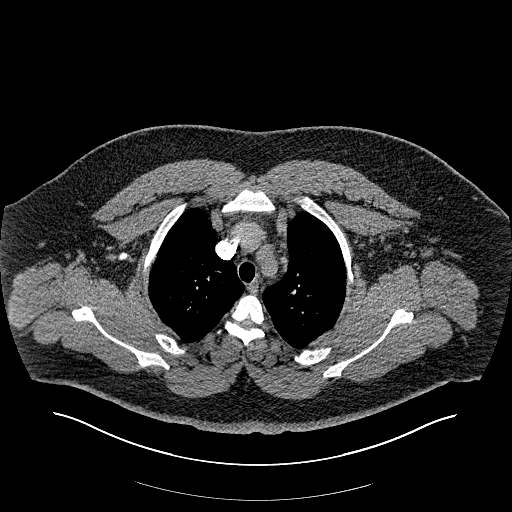
[im 236/278  lung]
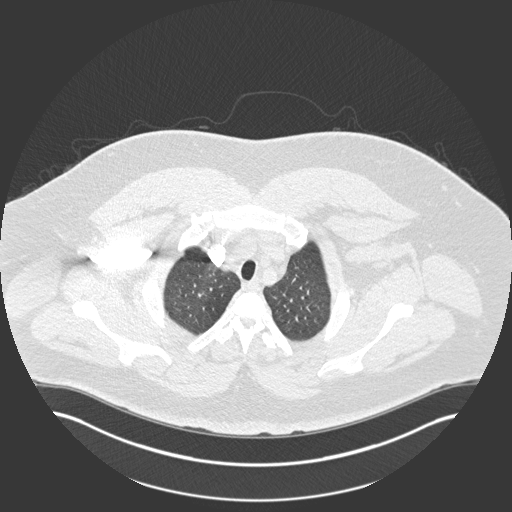
[im 250/278  mediastinal]
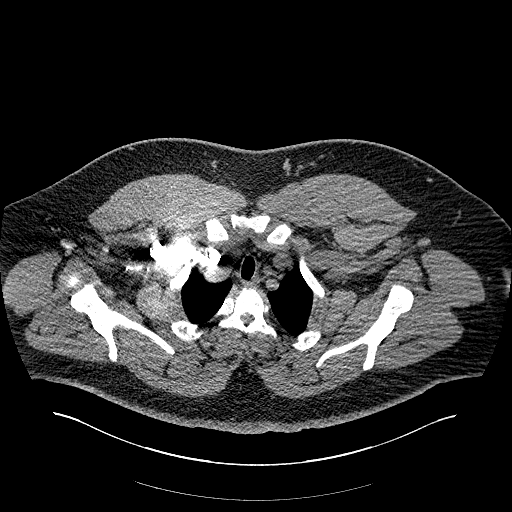
[im 264/278  lung]
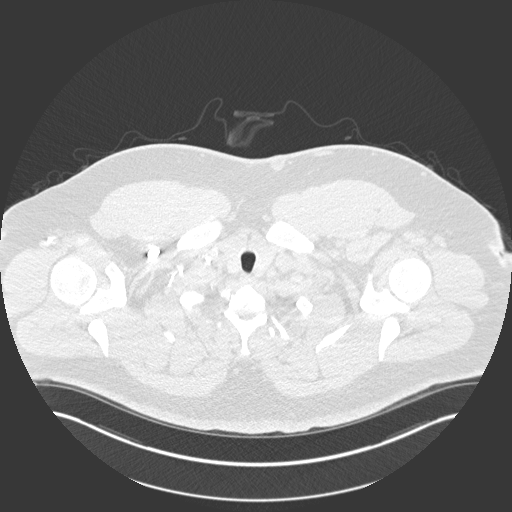

[Series 8: coronal mpr · coronal · 0.54mm/px · 1 of 151 slices shown]
[im 76/151  mediastinal]
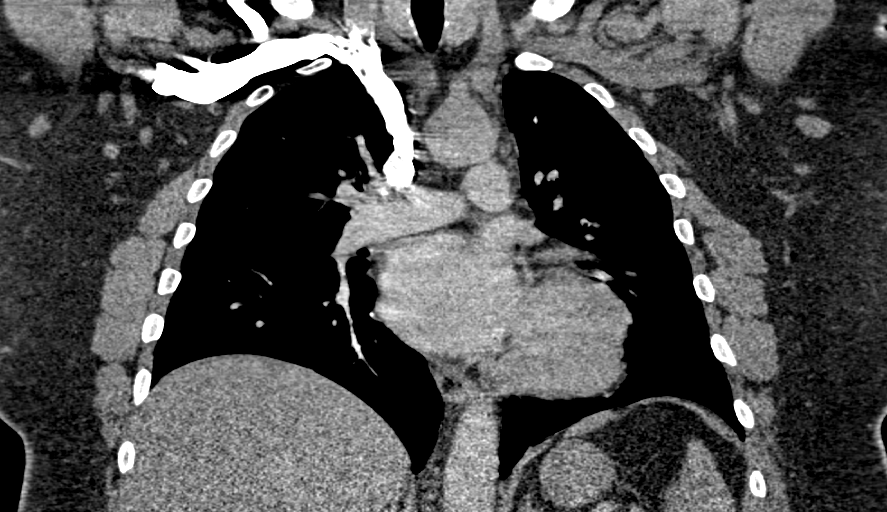

[18 of 36 positions shown; findings below may reference images not displayed]

FINDINGS: Cardiovascular: The heart size is upper limits of normal. There is
no evidence for right ventricular strain.

The aorta is unremarkable.

Pulmonary arterial opacification is suboptimal. This limits
evaluation of segmental arteries bilaterally. No lobar or proximal
pulmonary emboli are present.

Mediastinum/Nodes: No significant mediastinal adenopathy is present.
Subcentimeter axillary nodes are present bilaterally, and similar to
the [H6] scan.

Lungs/Pleura: Focal airspace disease is present within the medial
right upper lobe, similar the prior study. There is opacification of
the medial right upper lobe bronchus. The lungs are otherwise clear
without other focal nodule, mass, or airspace disease. There is no
pleural effusion.

Upper Abdomen: Unremarkable.

Musculoskeletal: A 2.5 cm subcutaneous nodule is noted posteriorly
on the right. This demonstrates interval growth from the prior study
or a 9 mm nodule was present. No other focal skin lesions are
present.

Review of the MIP images confirms the above findings.
IMPRESSION: 1. Medial right upper lobe airspace disease in a similar location to
the prior study. This likely represents a lobar pneumonia. There is
opacification of the feeding bronchus which may represent mucous
plugging. No definite neoplasm is present. This area is not well
seen on the chest x-ray. Recommend follow-up CT of the chest without
contrast at to 4 weeks after appropriate therapy.
2. No pulmonary embolus. Of note, the contrast bolus is suboptimal,
limiting evaluation of segmental artery is.
3. Enlarging subcutaneous soft tissue nodule in the right posterior
back. This may represent a sebaceous cyst. Neoplasm is considered
less likely given relatively slow growth. Recommend direct
examination.

## 2017-05-28 MED ORDER — LEVOFLOXACIN 750 MG PO TABS
750.0000 mg | ORAL_TABLET | Freq: Once | ORAL | Status: AC
  Administered 2017-05-28: 750 mg via ORAL
  Filled 2017-05-28: qty 1

## 2017-05-28 MED ORDER — IOPAMIDOL (ISOVUE-370) INJECTION 76%
INTRAVENOUS | Status: AC
  Filled 2017-05-28: qty 100

## 2017-05-28 MED ORDER — IOPAMIDOL (ISOVUE-370) INJECTION 76%
100.0000 mL | Freq: Once | INTRAVENOUS | Status: AC | PRN
  Administered 2017-05-28: 100 mL via INTRAVENOUS

## 2017-05-28 MED ORDER — LEVOFLOXACIN 750 MG PO TABS: 750.0000 mg | ORAL_TABLET | Freq: Every day | ORAL | 0 refills | Status: AC

## 2017-05-28 MED ORDER — DEXAMETHASONE 4 MG PO TABS
10.0000 mg | ORAL_TABLET | Freq: Once | ORAL | Status: AC
  Administered 2017-05-28: 10 mg via ORAL
  Filled 2017-05-28: qty 2

## 2017-05-28 NOTE — Discharge Instructions (Signed)
Please take all of your antibiotics until finished!   You may develop abdominal discomfort or diarrhea from the antibiotic.  You may help offset this with probiotics which you can buy or get in yogurt. Do not eat  or take the probiotics until 2 hours after your antibiotic.   You were given the first dose of your antibiotic today, so do not take another dose until tomorrow. Follow-up with your primary care physician next week for reevaluation. You should get a repeat CT scan in 4 weeks to make sure your pneumonia has resolved entirely. Return to the ED if any concerning signs or symptoms develop such as persistent shortness of breath, fever that is greater than 102F and not controlled by ibuprofen or Tylenol, or chest pain.

## 2017-05-28 NOTE — ED Triage Notes (Signed)
Pt states that the other night he woke up out of his sleep and began coughing up blood. Patient went to urgent care and his primary care doctor and they told him they couldn't find anything and that if it happened again to come to the ER. Patient presented picture which shows bright red blood that he states he coughed into the sink. Patient states that this happened a few years ago and it was due to right-sided pneumonia. No blood thinners, no heavy alcohol history, no history of stomach ulcers or varices.

## 2017-05-28 NOTE — ED Notes (Signed)
Patient transported to CT 

## 2017-05-28 NOTE — ED Provider Notes (Signed)
[facility] DEPT Provider Note   CSN: [id number] Arrival date & time: 05/28/17  6599     History   Chief Complaint Chief Complaint  Patient presents with  . Hemoptysis    HPI Calvin Bailey is a 36 y.o. male with history of HTN, GERD, obesity, OSA, sleep apnea, and hypogonadism who presents today with chief complaint acute onset, persisting hemoptysis. He states that since yesterday morning he has had 6 episodes of bloody sputum. Says he has had similar episodes in the past which were a result of a pneumonia. He went to urgent care and primary care yesterday with negative workup which includes chest x-ray.no aggravating or alleviating factors noted. He denies fevers, chills, CP, SOB, abdominal pain, nausea, vomiting, hematuria, melena, hematochezia. He does endorse bilateral lower extremity swelling which is chronic and unchanged for him.he is a current smoker. He denies recent trauma, surgeries or travel, history of cancer, or prior history of DVT or PE. He does take testosterone supplements daily.  The history is provided by the patient.    Past Medical History:  Diagnosis Date  . GERD (gastroesophageal reflux disease)   . HTN (hypertension)   . Obesity hypoventilation syndrome ([place]) 04/08/2014  . OSA (obstructive sleep apnea) 04/08/2014  . Sleep apnea     Patient Active Problem List   Diagnosis Date Noted  . Hypogonadism male 07/12/2014  . Erectile dysfunction due to arterial insufficiency 07/12/2014  . Obesity hypoventilation syndrome ([place]) 04/08/2014  . Obesity, morbid ([place]) 04/08/2014  . OSA (obstructive sleep apnea) 04/08/2014  . Tobacco use disorder 02/23/2011  . GERD (gastroesophageal reflux disease) 02/09/2011  . Benign essential HTN 07/21/2009    Past Surgical History:  Procedure Laterality Date  . two knee surgeries Left 1995       Home Medications    Prior to Admission medications   Medication Sig Start Date End Date Taking? Authorizing Provider    albuterol (PROVENTIL HFA;VENTOLIN HFA) 108 (90 Base) MCG/ACT inhaler Inhale 2 puffs into the lungs every 4 (four) hours as needed for wheezing or shortness of breath (cough, shortness of breath or wheezing.). 04/05/17  Yes English, Stephanie D, PA  carvedilol (COREG) 12.5 MG tablet TAKE 1 TABLET(12.5 MG) BY MOUTH TWICE DAILY WITH A MEAL 07/21/16  Yes Timmothy Euler, MD  furosemide (LASIX) 20 MG tablet Take 1 tablet (20 mg total) by mouth daily. 11/09/16  Yes Pollina, Gwenyth Allegra, MD  lisinopril (PRINIVIL,ZESTRIL) 40 MG tablet TAKE 1 TABLET(40 MG) BY MOUTH DAILY 01/26/17  Yes Terald Sleeper, PA-C  Testosterone (ANDROGEL) 20.25 MG/1.25GM (1.62%) GEL Two pumps on each shoulder daily 04/05/17  Yes English, Stephanie D, PA  valACYclovir (VALTREX) 1000 MG tablet Take 1,000 mg by mouth daily. 11/19/16  Yes [provider]  azithromycin (ZITHROMAX) 250 MG tablet Take 2 tabs PO x 1 dose, then 1 tab PO QD x 4 days Patient not taking: Reported on 05/28/2017 04/05/17   Ivar Drape D, PA  carvedilol (COREG) 12.5 MG tablet TAKE 1 TABLET BY MOUTH TWICE DAILY WITH A MEAL Patient not taking: Reported on 05/28/2017 09/13/16   Shawnee Knapp, MD  levofloxacin (LEVAQUIN) 750 MG tablet Take 1 tablet (750 mg total) by mouth daily. 05/28/17 06/01/17  Rodell Perna A, PA-C  nicotine (NICODERM CQ) 14 mg/24hr patch Place 1 patch (14 mg total) onto the skin daily. Patient not taking: Reported on 05/27/2017 04/05/17   Ivar Drape D, PA  nicotine (NICODERM CQ) 21 mg/24hr patch Please apply patch daily  for 6 weeks, then apply 14mg  patch for 2 weeks, then apply 7 mg patch daily for 2 weeks.  Start on quit date Patient not taking: Reported on 05/27/2017 04/05/17   Ivar Drape D, PA  nicotine (NICODERM CQ) 7 mg/24hr patch Place 1 patch (7 mg total) onto the skin daily. Patient not taking: Reported on 05/27/2017 04/05/17   Joretta Bachelor, PA    Family History Family History  Problem Relation Age of Onset  .  Hypertension Mother   . Multiple sclerosis Mother   . Hypertension Father   . Hypertension Brother   . Hyperlipidemia Paternal Grandfather   . Heart failure Paternal Grandfather   . Diabetes Maternal Grandmother   . Heart failure Maternal Grandfather     Social History Social History  Substance Use Topics  . Smoking status: Current Every Day Smoker    Packs/day: 1.00    Years: 15.00    Types: Cigarettes  . Smokeless tobacco: Never Used  . Alcohol use 0.0 oz/week     Comment: sparingly      Allergies   Chlorthalidone   Review of Systems Review of Systems  Constitutional: Negative for chills and fever.  HENT: Negative for congestion and nosebleeds.   Respiratory: Positive for cough. Negative for shortness of breath.        Hemoptysis   Cardiovascular: Positive for leg swelling (chronic, unchanged). Negative for chest pain.  Gastrointestinal: Negative for abdominal pain, constipation, diarrhea and nausea.  Genitourinary: Negative for hematuria.  Neurological: Negative for headaches.  All other systems reviewed and are negative.    Physical Exam Updated Vital Signs BP (!) 196/112 (BP Location: Left Arm)   Pulse 67   Temp 98.2 F (36.8 C) (Oral)   Resp 20   Ht 6\' 1"  (1.854 m)   Wt (!) 172.4 kg (380 lb)   SpO2 98%   BMI 50.13 kg/m   Physical Exam  Constitutional: He appears well-developed and well-nourished. No distress.  HENT:  Head: Normocephalic and atraumatic.  Nose: Nose normal.  Mouth/Throat: Oropharynx is clear and moist.  Nasal septum is midline with no evidence of septal hematoma or recent nosebleed. Posterior pharynx without erythema, uvular deviation, postnasal drip, or bleeding posteriorly  Eyes: Conjunctivae are normal. Right eye exhibits no discharge. Left eye exhibits no discharge.  Neck: Normal range of motion. Neck supple. No JVD present. No tracheal deviation present.  Cardiovascular: Normal rate, regular rhythm, normal heart sounds and  intact distal pulses.   2+ radial and DP/PT pulses bl, negative Homan's bl. Bilateral lower extremity edema is noted, 2+ nonpitting. Calves measure 52 cm bilaterally at the widest part  Pulmonary/Chest: Effort normal. He has wheezes. He exhibits no tenderness.  Expiratory wheezes noted to the left anterior lung field, no increased work of breathing noted  Abdominal: Soft. Bowel sounds are normal. He exhibits no distension. There is no tenderness.  Musculoskeletal: Normal range of motion. He exhibits no edema or tenderness.  Neurological: He is alert.  Fluent speech, no facial droop  Skin: Skin is warm and dry. No erythema.  Psychiatric: He has a normal mood and affect. His behavior is normal.  Nursing note and vitals reviewed.    ED Treatments / Results  Labs (all labs ordered are listed, but only abnormal results are displayed) Labs Reviewed - No data to display  EKG  EKG Interpretation None       Radiology Dg Chest 1 View  Result Date: 05/27/2017 CLINICAL DATA:  Hemoptysis.  Smoker.  Hypertension. EXAM: CHEST 1 VIEW COMPARISON:  04/04/2017. FINDINGS: The heart remains normal in size. The lungs remain clear with normal vascularity. Interval mild diffuse peribronchial thickening. Unremarkable bones. IMPRESSION: Interval mild bronchitic changes. Electronically Signed   By: Claudie Revering M.D.   On: 05/27/2017 13:37   Dg Chest 2 View  Result Date: 05/27/2017 CLINICAL DATA:  Hemoptysis. EXAM: CHEST  2 VIEW COMPARISON:  Radiograph of same day. FINDINGS: The heart size and mediastinal contours are within normal limits. Both lungs are clear. No pneumothorax or pleural effusion is noted. The visualized skeletal structures are unremarkable. IMPRESSION: No active cardiopulmonary disease. Electronically Signed   By: Marijo Conception, M.D.   On: 05/27/2017 12:57   Ct Angio Chest Pe W And/or Wo Contrast  Result Date: 05/28/2017 CLINICAL DATA:  Hemoptysis.  Personal history of pneumonia. EXAM: CT  ANGIOGRAPHY CHEST WITH CONTRAST TECHNIQUE: Multidetector CT imaging of the chest was performed using the standard protocol during bolus administration of intravenous contrast. Multiplanar CT image reconstructions and MIPs were obtained to evaluate the vascular anatomy. CONTRAST:  100 mL Isovue 370 COMPARISON:  Two-view chest x-ray 05/27/2017. CTA of the chest 03/04/2010 FINDINGS: Cardiovascular: The heart size is upper limits of normal. There is no evidence for right ventricular strain. The aorta is unremarkable. Pulmonary arterial opacification is suboptimal. This limits evaluation of segmental arteries bilaterally. No lobar or proximal pulmonary emboli are present. Mediastinum/Nodes: No significant mediastinal adenopathy is present. Subcentimeter axillary nodes are present bilaterally, and similar to the 2011 scan. Lungs/Pleura: Focal airspace disease is present within the medial right upper lobe, similar the prior study. There is opacification of the medial right upper lobe bronchus. The lungs are otherwise clear without other focal nodule, mass, or airspace disease. There is no pleural effusion. Upper Abdomen: Unremarkable. Musculoskeletal: A 2.5 cm subcutaneous nodule is noted posteriorly on the right. This demonstrates interval growth from the prior study or a 9 mm nodule was present. No other focal skin lesions are present. Review of the MIP images confirms the above findings. IMPRESSION: 1. Medial right upper lobe airspace disease in a similar location to the prior study. This likely represents a lobar pneumonia. There is opacification of the feeding bronchus which may represent mucous plugging. No definite neoplasm is present. This area is not well seen on the chest x-ray. Recommend follow-up CT of the chest without contrast at to 4 weeks after appropriate therapy. 2. No pulmonary embolus. Of note, the contrast bolus is suboptimal, limiting evaluation of segmental artery is. 3. Enlarging subcutaneous soft  tissue nodule in the right posterior back. This may represent a sebaceous cyst. Neoplasm is considered less likely given relatively slow growth. Recommend direct examination. Electronically Signed   By: San Morelle M.D.   On: 05/28/2017 08:04    Procedures Procedures (including critical care time)  Medications Ordered in ED Medications  iopamidol (ISOVUE-370) 76 % injection (not administered)  dexamethasone (DECADRON) tablet 10 mg (not administered)  levofloxacin (LEVAQUIN) tablet 750 mg (not administered)  iopamidol (ISOVUE-370) 76 % injection 100 mL (100 mLs Intravenous Contrast Given 05/28/17 0715)     Initial Impression / Assessment and Plan / ED Course  I have reviewed the triage vital signs and the nursing notes.  Pertinent labs & imaging results that were available during my care of the patient were reviewed by me and considered in my medical decision making (see chart for details).     Patient complains of hemoptysis since yesterday. Workup yesterday with  primary care including CBC, CMP and chest x-ray were unremarkable.  With risk factors for PE including smoking hx, obesity, and testosterone replacement therapy, CTA chest was obtained, which showed a medial right upper lobe pneumonia.recommend repeat CT in 4 weeks to assess for neoplasm. No PE was visualized, however contrast bolus is suboptimal. However, patient is not tachypneic, tachycardic, or hypoxic which is reassuring. He has hypertensive, but there was some improvement while in the ED and he has not had his blood pressure medication yet today. Doubt MI, ACS, pericarditis in the absence of chest pain.first dose of Levaquin given in the ED, as well as Decadron as patient had some wheezing on auscultation on reevaluation. Recommend follow-up with primary care in 1 week for reevaluation as well as for repeat CT in 4 weeks. Discussed indications for return to the ED. Pt verbalized understanding of and agreement with plan and  is safe for discharge home at this time. Patient seen and evaluated by Dr. Ellender Hose who agrees with assessment and plan at this time.   Final Clinical Impressions(s) / ED Diagnoses   Final diagnoses:  Community acquired pneumonia of right upper lobe of lung (HCC)  Hemoptysis    New Prescriptions New Prescriptions   LEVOFLOXACIN (LEVAQUIN) 750 MG TABLET    Take 1 tablet (750 mg total) by mouth daily.     Renita Papa, PA-C 05/28/17 0223    Shanon Rosser, MD 05/28/17 [id number]

## 2017-05-31 ENCOUNTER — Telehealth: Payer: Self-pay | Admitting: Internal Medicine

## 2017-05-31 LAB — COMPREHENSIVE METABOLIC PANEL
ALT: 23 IU/L (ref 0–44)
AST: 27 IU/L (ref 0–40)
Albumin/Globulin Ratio: 1.2 (ref 1.2–2.2)
Albumin: 4.1 g/dL (ref 3.5–5.5)
Alkaline Phosphatase: 83 IU/L (ref 39–117)
BUN/Creatinine Ratio: 10 (ref 9–20)
BUN: 15 mg/dL (ref 6–20)
Bilirubin Total: 0.4 mg/dL (ref 0.0–1.2)
CO2: 25 mmol/L (ref 20–29)
Calcium: 9.4 mg/dL (ref 8.7–10.2)
Chloride: 102 mmol/L (ref 96–106)
Creatinine, Ser: 1.47 mg/dL — ABNORMAL HIGH (ref 0.76–1.27)
GFR calc Af Amer: 69 mL/min/{1.73_m2} (ref >59)
GFR calc non Af Amer: 60 mL/min/{1.73_m2} (ref >59)
Globulin, Total: 3.4 g/dL (ref 1.5–4.5)
Glucose: 91 mg/dL (ref 65–99)
Potassium: 4.4 mmol/L (ref 3.5–5.2)
Sodium: 142 mmol/L (ref 134–144)
Total Protein: 7.5 g/dL (ref 6.0–8.5)

## 2017-05-31 LAB — MPO/PR-3 (ANCA) ANTIBODIES
ANCA Proteinase 3: <3.5 U/mL (ref 0.0–3.5)
Myeloperoxidase Ab: <9 U/mL (ref 0.0–9.0)

## 2017-05-31 NOTE — Telephone Encounter (Signed)
Pt scheduled to see MW on 9/13 at 1:30.  Nothing further needed.

## 2017-06-02 ENCOUNTER — Ambulatory Visit (INDEPENDENT_AMBULATORY_CARE_PROVIDER_SITE_OTHER): Payer: 59 | Admitting: Family Medicine

## 2017-06-02 ENCOUNTER — Ambulatory Visit (INDEPENDENT_AMBULATORY_CARE_PROVIDER_SITE_OTHER): Payer: 59

## 2017-06-02 ENCOUNTER — Ambulatory Visit: Payer: 59 | Admitting: Family Medicine

## 2017-06-02 ENCOUNTER — Encounter: Payer: Self-pay | Admitting: Family Medicine

## 2017-06-02 VITALS — BP 138/82 | HR 64 | Temp 98.0°F | Resp 16 | Ht 73.62 in | Wt 397.0 lb

## 2017-06-02 DIAGNOSIS — J9811 Atelectasis: Secondary | ICD-10-CM

## 2017-06-02 DIAGNOSIS — J181 Lobar pneumonia, unspecified organism: Secondary | ICD-10-CM

## 2017-06-02 DIAGNOSIS — J189 Pneumonia, unspecified organism: Secondary | ICD-10-CM

## 2017-06-02 DIAGNOSIS — R042 Hemoptysis: Secondary | ICD-10-CM | POA: Diagnosis not present

## 2017-06-02 DIAGNOSIS — F17200 Nicotine dependence, unspecified, uncomplicated: Secondary | ICD-10-CM | POA: Diagnosis not present

## 2017-06-02 DIAGNOSIS — G4733 Obstructive sleep apnea (adult) (pediatric): Secondary | ICD-10-CM | POA: Diagnosis not present

## 2017-06-02 LAB — POCT CBC
GRANULOCYTE PERCENT: 67.9 % (ref 37–80)
HEMATOCRIT: 43.6 % (ref 43.5–53.7)
Hemoglobin: 15.1 g/dL (ref 14.1–18.1)
Lymph, poc: 2.3 (ref 0.6–3.4)
MCH: 29 pg (ref 27–31.2)
MCHC: 34.6 g/dL (ref 31.8–35.4)
MCV: 83.6 fL (ref 80–97)
MID (cbc): 0.5 (ref 0–0.9)
MPV: 7.2 fL (ref 0–99.8)
PLATELET COUNT, POC: 234 10*3/uL (ref 142–424)
POC GRANULOCYTE: 6 (ref 2–6.9)
POC LYMPH PERCENT: 26.1 %L (ref 10–50)
POC MID %: 6 %M (ref 0–12)
RBC: 5.21 M/uL (ref 4.69–6.13)
RDW, POC: 13.3 %
WBC: 8.9 10*3/uL (ref 4.6–10.2)

## 2017-06-02 LAB — POCT SEDIMENTATION RATE: POCT SED RATE: 7 mm/hr (ref 0–22)

## 2017-06-02 IMAGING — DX DG CHEST 2V
2 series · 2 of 2 positions shown · non-contrast
Comparison: [DATE], CT chest [DATE]

CLINICAL DATA: Follow-up right upper lobe pneumonia.

EXAM:
CHEST  2 VIEW

[chest pa]
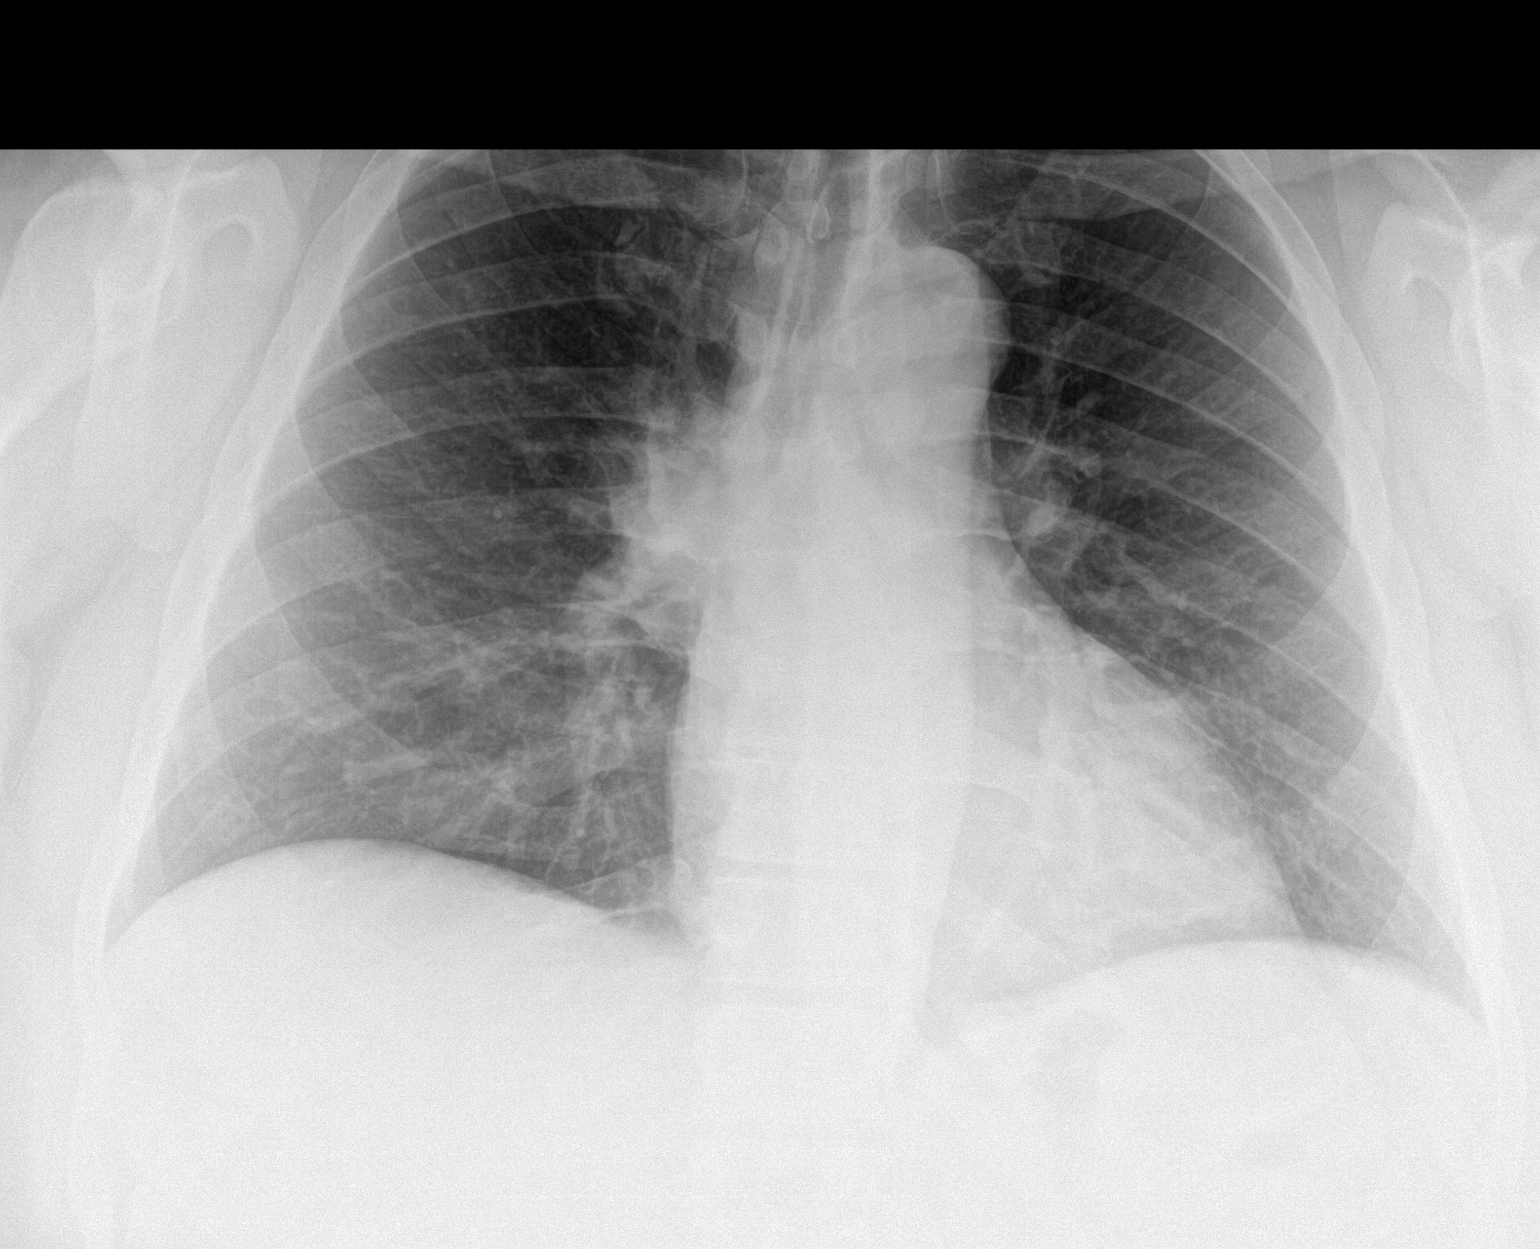

[chest lat]
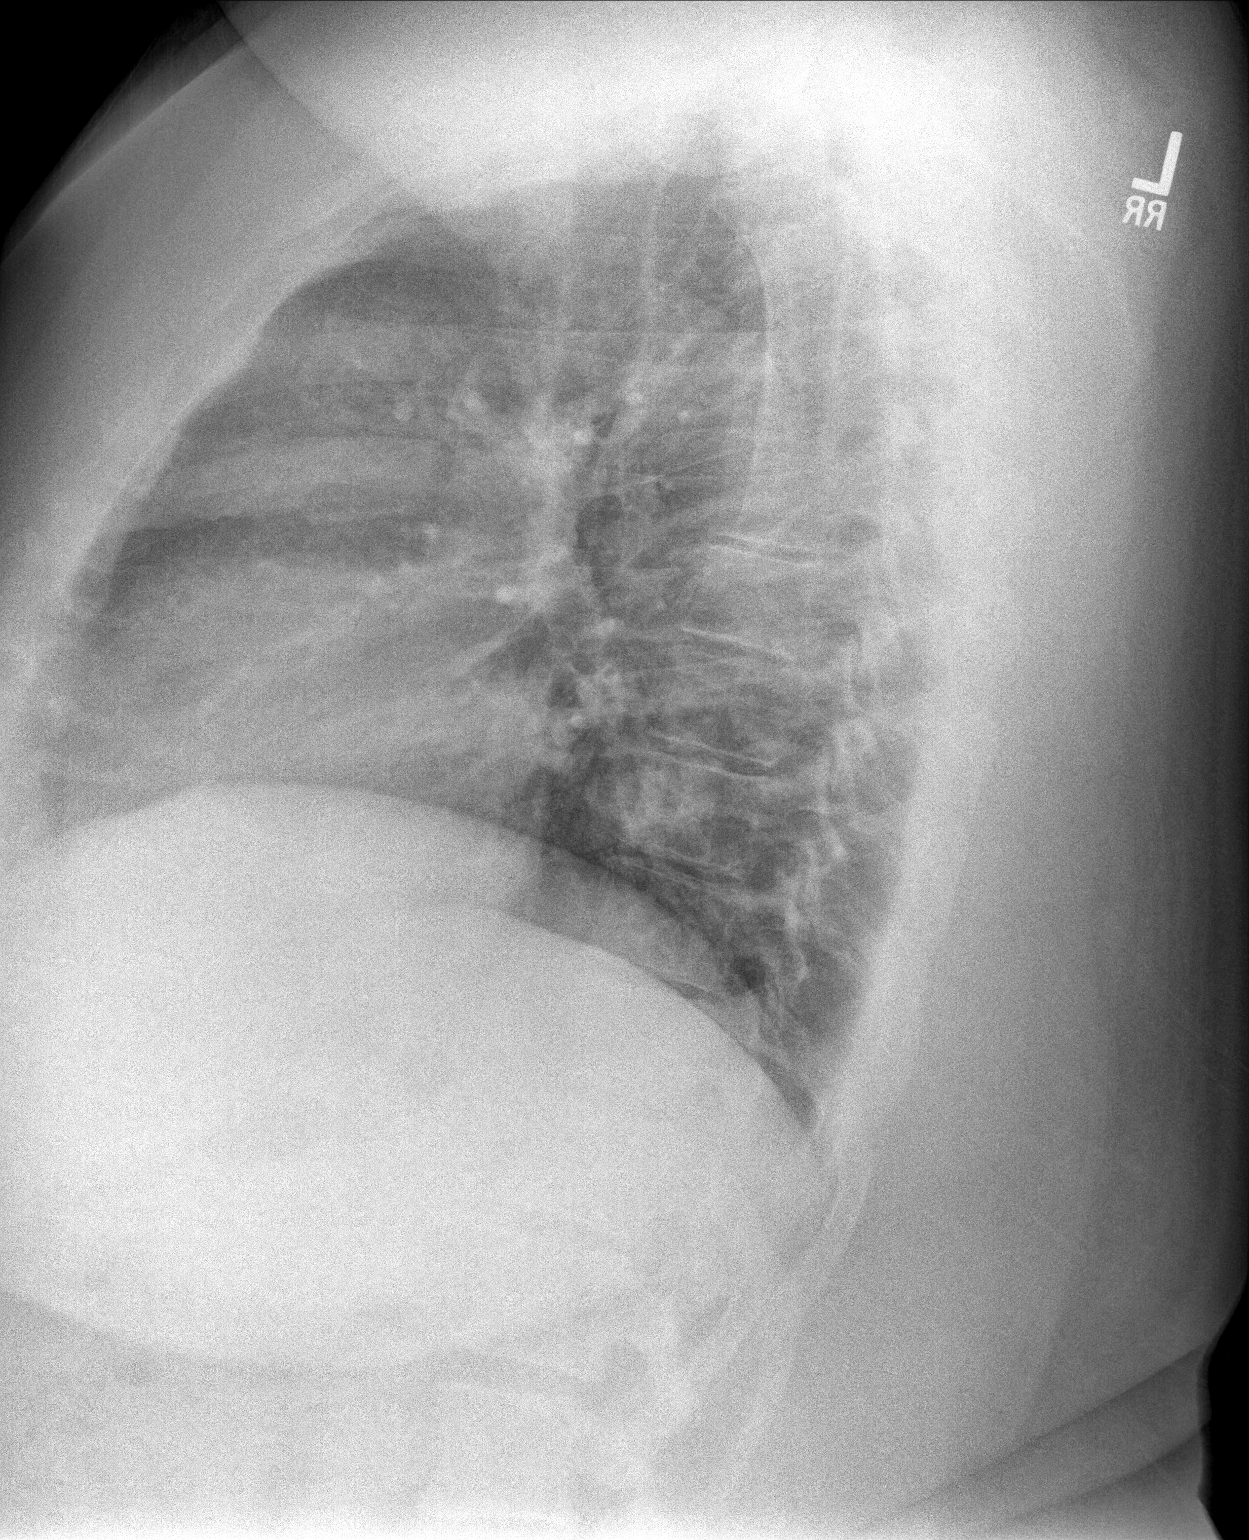

[2 of 2 positions shown; findings below may reference images not displayed]

FINDINGS: The heart size and mediastinal contours are within normal limits.
There is no focal infiltrate, pulmonary edema, or pleural effusion.
The visualized skeletal structures are unremarkable.
IMPRESSION: No active cardiopulmonary disease.

## 2017-06-02 MED ORDER — IPRATROPIUM BROMIDE 0.02 % IN SOLN
0.5000 mg | Freq: Once | RESPIRATORY_TRACT | Status: AC
  Administered 2017-06-02: 0.5 mg via RESPIRATORY_TRACT

## 2017-06-02 MED ORDER — ALBUTEROL SULFATE (2.5 MG/3ML) 0.083% IN NEBU
2.5000 mg | INHALATION_SOLUTION | Freq: Once | RESPIRATORY_TRACT | Status: AC
  Administered 2017-06-02: 2.5 mg via RESPIRATORY_TRACT

## 2017-06-02 MED ORDER — MUCINEX DM MAXIMUM STRENGTH 60-1200 MG PO TB12: 1.0000 | ORAL_TABLET | Freq: Two times a day (BID) | ORAL | 1 refills | Status: DC

## 2017-06-02 MED ORDER — PREDNISONE 20 MG PO TABS: ORAL_TABLET | ORAL | 0 refills | Status: DC

## 2017-06-02 MED ORDER — ALBUTEROL SULFATE HFA 108 (90 BASE) MCG/ACT IN AERS
2.0000 | INHALATION_SPRAY | RESPIRATORY_TRACT | 1 refills | Status: DC | PRN
Start: 2017-06-02 — End: 2018-02-15

## 2017-06-02 NOTE — Progress Notes (Signed)
Subjective:    Patient ID: Calvin Bailey, male    DOB: 1981/09/05, 36 y.o.   MRN: [id number] Chief Complaint  Patient presents with  . Pneumonia    pt had pne on 9/1 and would like to follow-up. He states he is sill coughing up blood.     HPI  He has cont coughing up little bits of bright red blood in his sputum. Has not coughed up any today so is hopeful that perhaps he is turning the corner. Overall is feeling better.  No f/c.   Past Medical History:  Diagnosis Date  . GERD (gastroesophageal reflux disease)   . HTN (hypertension)   . Obesity hypoventilation syndrome ([place]) 04/08/2014  . OSA (obstructive sleep apnea) 04/08/2014  . Sleep apnea    Past Surgical History:  Procedure Laterality Date  . two knee surgeries Left 1995   Current Outpatient Prescriptions on File Prior to Visit  Medication Sig Dispense Refill  . albuterol (PROVENTIL HFA;VENTOLIN HFA) 108 (90 Base) MCG/ACT inhaler Inhale 2 puffs into the lungs every 4 (four) hours as needed for wheezing or shortness of breath (cough, shortness of breath or wheezing.). 1 Inhaler 1  . carvedilol (COREG) 12.5 MG tablet TAKE 1 TABLET(12.5 MG) BY MOUTH TWICE DAILY WITH A MEAL 180 tablet 0  . carvedilol (COREG) 12.5 MG tablet TAKE 1 TABLET BY MOUTH TWICE DAILY WITH A MEAL 180 tablet 0  . furosemide (LASIX) 20 MG tablet Take 1 tablet (20 mg total) by mouth daily. 5 tablet 0  . lisinopril (PRINIVIL,ZESTRIL) 40 MG tablet TAKE 1 TABLET(40 MG) BY MOUTH DAILY 90 tablet 0  . nicotine (NICODERM CQ) 14 mg/24hr patch Place 1 patch (14 mg total) onto the skin daily. 14 patch 0  . nicotine (NICODERM CQ) 21 mg/24hr patch Please apply patch daily for 6 weeks, then apply 14mg  patch for 2 weeks, then apply 7 mg patch daily for 2 weeks.  Start on quit date 42 patch 0  . nicotine (NICODERM CQ) 7 mg/24hr patch Place 1 patch (7 mg total) onto the skin daily. 14 patch 0  . Testosterone (ANDROGEL) 20.25 MG/1.25GM (1.62%) GEL Two pumps on each shoulder  daily 5 g 1  . valACYclovir (VALTREX) 1000 MG tablet Take 1,000 mg by mouth daily.    . [DISCONTINUED] LISINOPRIL-HYDROCHLOROTHIAZIDE PO Take by mouth.     No current facility-administered medications on file prior to visit.    Allergies  Allergen Reactions  . Chlorthalidone Nausea Only   Family History  Problem Relation Age of Onset  . Hypertension Mother   . Multiple sclerosis Mother   . Hypertension Father   . Hypertension Brother   . Hyperlipidemia Paternal Grandfather   . Heart failure Paternal Grandfather   . Diabetes Maternal Grandmother   . Heart failure Maternal Grandfather    Social History   Social History  . Marital status: Single    Spouse name: N/A  . Number of children: 1  . Years of education: college   Occupational History  . unemployment Unemployed  .  [employer]   Social History Main Topics  . Smoking status: Current Every Day Smoker    Packs/day: 1.00    Years: 15.00    Types: Cigarettes  . Smokeless tobacco: Never Used  . Alcohol use 0.0 oz/week     Comment: sparingly   . Drug use: No  . Sexual activity: Yes    Partners: Female   Other Topics Concern  . None  Social History Narrative   Nature conservation officer   Patient is single and lives alone.   Patient has one child.   Patient works at [employer].   Patient has a college education.   Patient is left-handed.   Patient does not drink any caffeine.   Depression screen [facility] 2/9 06/02/2017 05/27/2017 04/04/2017 07/21/2016 04/29/2016  Decreased Interest 0 0 0 0 0  Down, Depressed, Hopeless 0 0 0 0 0  PHQ - 2 Score 0 0 0 0 0    Review of Systems See hpi    Objective:   Physical Exam  Constitutional: He is oriented to person, place, and time. He appears well-developed and well-nourished. No distress.  HENT:  Head: Normocephalic and atraumatic.  Eyes: Pupils are equal, round, and reactive to light. Conjunctivae are normal. No scleral icterus.  Neck: Normal range of motion. Neck  supple. No thyromegaly present.  Cardiovascular: Normal rate, regular rhythm and intact distal pulses.   Murmur (RUSB) heard.  Systolic murmur is present with a grade of 2/6  Pulmonary/Chest: Effort normal. No respiratory distress. He has wheezes (right > left) in the right upper field and the left upper field.  Musculoskeletal: He exhibits no edema.  Lymphadenopathy:    He has no cervical adenopathy.  Neurological: He is alert and oriented to person, place, and time.  Skin: Skin is warm and dry. He is not diaphoretic.  Psychiatric: He has a normal mood and affect. His behavior is normal.      BP 138/82   Pulse 64   Temp 98 F (36.7 C) (Oral)   Resp 16   Ht 6' 1.62" (1.87 m)   Wt (!) 397 lb (180.1 kg)   SpO2 98%   BMI 51.50 kg/m     pre-neb peak flow 300; post-neb peak flow 400; predicted 655  Last EKG done 04/14/2017. EKG: NSR, no acute ischemic changes noted. Has significant;y change noted when compared to prior EKG done 04/04/17. QT sig longer - 346 prior -> 410 today. Otherwise, appears more normal than prior.    I have personally reviewed the EKG tracing and agree with the computer interpretation.  Results for orders placed or performed in visit on 06/02/17  POCT SEDIMENTATION RATE  Result Value Ref Range   POCT SED RATE 7 0 - 22 mm/hr  POCT CBC  Result Value Ref Range   WBC 8.9 4.6 - 10.2 K/uL   Lymph, poc 2.3 0.6 - 3.4   POC LYMPH PERCENT 26.1 10 - 50 %L   MID (cbc) 0.5 0 - 0.9   POC MID % 6.0 0 - 12 %M   POC Granulocyte 6.0 2 - 6.9   Granulocyte percent 67.9 37 - 80 %G   RBC 5.21 4.69 - 6.13 M/uL   Hemoglobin 15.1 14.1 - 18.1 g/dL   HCT, POC 43.6 43.5 - 53.7 %   MCV 83.6 80 - 97 fL   MCH, POC 29.0 27 - 31.2 pg   MCHC 34.6 31.8 - 35.4 g/dL   RDW, POC 13.3 %   Platelet Count, POC 234 142 - 424 K/uL   MPV 7.2 0 - 99.8 fL   Dg Chest 1 View  Result Date: 05/27/2017 CLINICAL DATA:  Hemoptysis.  Smoker.  Hypertension. EXAM: CHEST 1 VIEW COMPARISON:  04/04/2017.  FINDINGS: The heart remains normal in size. The lungs remain clear with normal vascularity. Interval mild diffuse peribronchial thickening. Unremarkable bones. IMPRESSION: Interval mild bronchitic changes. Electronically Signed   By: Remo Lipps  Joneen Caraway M.D.   On: 05/27/2017 13:37   Dg Chest 2 View  Result Date: 06/02/2017 CLINICAL DATA:  Follow-up right upper lobe pneumonia. EXAM: CHEST  2 VIEW COMPARISON:  May 27, 2017, CT chest May 28, 2017 FINDINGS: The heart size and mediastinal contours are within normal limits. There is no focal infiltrate, pulmonary edema, or pleural effusion. The visualized skeletal structures are unremarkable. IMPRESSION: No active cardiopulmonary disease. Electronically Signed   By: Abelardo Diesel M.D.   On: 06/02/2017 17:07   Dg Chest 2 View  Result Date: 05/27/2017 CLINICAL DATA:  Hemoptysis. EXAM: CHEST  2 VIEW COMPARISON:  Radiograph of same day. FINDINGS: The heart size and mediastinal contours are within normal limits. Both lungs are clear. No pneumothorax or pleural effusion is noted. The visualized skeletal structures are unremarkable. IMPRESSION: No active cardiopulmonary disease. Electronically Signed   By: Marijo Conception, M.D.   On: 05/27/2017 12:57   Ct Angio Chest Pe W And/or Wo Contrast  Result Date: 05/28/2017 CLINICAL DATA:  Hemoptysis.  Personal history of pneumonia. EXAM: CT ANGIOGRAPHY CHEST WITH CONTRAST TECHNIQUE: Multidetector CT imaging of the chest was performed using the standard protocol during bolus administration of intravenous contrast. Multiplanar CT image reconstructions and MIPs were obtained to evaluate the vascular anatomy. CONTRAST:  100 mL Isovue 370 COMPARISON:  Two-view chest x-ray 05/27/2017. CTA of the chest 03/04/2010 FINDINGS: Cardiovascular: The heart size is upper limits of normal. There is no evidence for right ventricular strain. The aorta is unremarkable. Pulmonary arterial opacification is suboptimal. This limits evaluation of  segmental arteries bilaterally. No lobar or proximal pulmonary emboli are present. Mediastinum/Nodes: No significant mediastinal adenopathy is present. Subcentimeter axillary nodes are present bilaterally, and similar to the 2011 scan. Lungs/Pleura: Focal airspace disease is present within the medial right upper lobe, similar the prior study. There is opacification of the medial right upper lobe bronchus. The lungs are otherwise clear without other focal nodule, mass, or airspace disease. There is no pleural effusion. Upper Abdomen: Unremarkable. Musculoskeletal: A 2.5 cm subcutaneous nodule is noted posteriorly on the right. This demonstrates interval growth from the prior study or a 9 mm nodule was present. No other focal skin lesions are present. Review of the MIP images confirms the above findings. IMPRESSION: 1. Medial right upper lobe airspace disease in a similar location to the prior study. This likely represents a lobar pneumonia. There is opacification of the feeding bronchus which may represent mucous plugging. No definite neoplasm is present. This area is not well seen on the chest x-ray. Recommend follow-up CT of the chest without contrast at to 4 weeks after appropriate therapy. 2. No pulmonary embolus. Of note, the contrast bolus is suboptimal, limiting evaluation of segmental artery is. 3. Enlarging subcutaneous soft tissue nodule in the right posterior back. This may represent a sebaceous cyst. Neoplasm is considered less likely given relatively slow growth. Recommend direct examination. Electronically Signed   By: San Morelle M.D.   On: 05/28/2017 08:04    Assessment & Plan:  Patient was noted to have a murmur when he stopped HCTZ and started carvedilol in early 2016 by a PA here. He was referred to cardiology and seen by Dr. Katharina Caper January 2016 who did not appreciate a murmur but noted that his symptoms could have been due to left ventricular outflow tract obstruction with the  medication change. An echocardiogram was obtained and Dr. Katharina Caper suggested patient restart on HCTZ. It was also noted that he had very poor  compliance with his CPAP machine but had made excellent strides with weight loss.  Unfortunately he has gained back the weight since that time.  Sleep study done August 2015. Echocardiogram was never done.  1. Pneumonia of right upper lobe due to infectious organism [facility]) - CTA scan done 9/1 showed pna though CXR the day prior was nml. Needs repeat f/u chest CT w/o contrast after 4 wks after treatment - will order today.  2. Tobacco use disorder   3. Hemoptysis - from pna, improving. To ER if worsening.  4. OSA (obstructive sleep apnea)   5. Obesity, morbid ([place])   6. Atelectasis     Orders Placed This Encounter  Procedures  . DG Chest 2 View    Standing Status:   Future    Number of Occurrences:   1    Standing Expiration Date:   06/02/2018    Order Specific Question:   Reason for Exam (SYMPTOM  OR DIAGNOSIS REQUIRED)    Answer:   f/u RUL pneumonia. No sx improvement, hemoptysis continues    Order Specific Question:   Preferred imaging location?    Answer:   External  . Care order/instruction:    Scheduling Instructions:     Peak Flow (IF NEB IS ORDERED PLEASE DO BEFORE AND AFTER NEB)  . POCT SEDIMENTATION RATE  . POCT CBC  . EKG 12-Lead    Meds ordered this encounter  Medications  . albuterol (PROVENTIL) (2.5 MG/3ML) 0.083% nebulizer solution 2.5 mg  . ipratropium (ATROVENT) nebulizer solution 0.5 mg  . predniSONE (DELTASONE) 20 MG tablet    Sig: Take 3 tabs qd x 3d, then 2 tabs qd x 3d then 1 tab qd x 3d.    Dispense:  18 tablet    Refill:  0  . Dextromethorphan-Guaifenesin (MUCINEX DM MAXIMUM STRENGTH) 60-1200 MG TB12    Sig: Take 1 tablet by mouth every 12 (twelve) hours.    Dispense:  28 each    Refill:  1  . albuterol (PROVENTIL HFA;VENTOLIN HFA) 108 (90 Base) MCG/ACT inhaler    Sig: Inhale 2 puffs into the lungs every 4 (four)  hours as needed for wheezing or shortness of breath (cough, shortness of breath or wheezing.).    Dispense:  1 Inhaler    Refill:  1     Delman Cheadle, M.D.  Primary Care at [facility] [address] [place], [place] [address] [phone] phone [phone] fax  06/08/17 10:51 PM

## 2017-06-02 NOTE — Patient Instructions (Addendum)
   IF you received an x-ray today, you will receive an invoice from [facility]. Please contact [facility] at [phone] with questions or concerns regarding your invoice.   IF you received labwork today, you will receive an invoice from [facility]. Please contact [facility] at [phone] with questions or concerns regarding your invoice.   Our billing staff will not be able to assist you with questions regarding bills from these companies.  You will be contacted with the lab results as soon as they are available. The fastest way to get your results is to activate your My Chart account. Instructions are located on the last page of this paperwork. If you have not heard from us regarding the results in 2 weeks, please contact this office.      Acute Bronchitis, Adult Acute bronchitis is sudden (acute) swelling of the air tubes (bronchi) in the lungs. Acute bronchitis causes these tubes to fill with mucus, which can make it hard to breathe. It can also cause coughing or wheezing. In adults, acute bronchitis usually goes away within 2 weeks. A cough caused by bronchitis may last up to 3 weeks. Smoking, allergies, and asthma can make the condition worse. Repeated episodes of bronchitis may cause further lung problems, such as chronic obstructive pulmonary disease (COPD). What are the causes? This condition can be caused by germs and by substances that irritate the lungs, including:  Cold and flu viruses. This condition is most often caused by the same virus that causes a cold.  Bacteria.  Exposure to tobacco smoke, dust, fumes, and air pollution.  What increases the risk? This condition is more likely to develop in people who:  Have close contact with someone with acute bronchitis.  Are exposed to lung irritants, such as tobacco smoke, dust, fumes, and vapors.  Have a weak immune system.  Have a respiratory condition such as asthma.  What are the signs or  symptoms? Symptoms of this condition include:  A cough.  Coughing up clear, yellow, or green mucus.  Wheezing.  Chest congestion.  Shortness of breath.  A fever.  Body aches.  Chills.  A sore throat.  How is this diagnosed? This condition is usually diagnosed with a physical exam. During the exam, your health care provider may order tests, such as chest X-rays, to rule out other conditions. He or she may also:  Test a sample of your mucus for bacterial infection.  Check the level of oxygen in your blood. This is done to check for pneumonia.  Do a chest X-ray or lung function testing to rule out pneumonia and other conditions.  Perform blood tests.  Your health care provider will also ask about your symptoms and medical history. How is this treated? Most cases of acute bronchitis clear up over time without treatment. Your health care provider may recommend:  Drinking more fluids. Drinking more makes your mucus thinner, which may make it easier to breathe.  Taking a medicine for a fever or cough.  Taking an antibiotic medicine.  Using an inhaler to help improve shortness of breath and to control a cough.  Using a cool mist vaporizer or humidifier to make it easier to breathe.  Follow these instructions at home: Medicines  Take over-the-counter and prescription medicines only as told by your health care provider.  If you were prescribed an antibiotic, take it as told by your health care provider. Do not stop taking the antibiotic even if you start to feel better. General instructions    Get plenty of rest.  Drink enough fluids to keep your urine clear or pale yellow.  Avoid smoking and secondhand smoke. Exposure to cigarette smoke or irritating chemicals will make bronchitis worse. If you smoke and you need help quitting, ask your health care provider. Quitting smoking will help your lungs heal faster.  Use an inhaler, cool mist vaporizer, or humidifier as told  by your health care provider.  Keep all follow-up visits as told by your health care provider. This is important. How is this prevented? To lower your risk of getting this condition again:  Wash your hands often with soap and water. If soap and water are not available, use hand sanitizer.  Avoid contact with people who have cold symptoms.  Try not to touch your hands to your mouth, nose, or eyes.  Make sure to get the flu shot every year.  Contact a health care provider if:  Your symptoms do not improve in 2 weeks of treatment. Get help right away if:  You cough up blood.  You have chest pain.  You have severe shortness of breath.  You become dehydrated.  You faint or keep feeling like you are going to faint.  You keep vomiting.  You have a severe headache.  Your fever or chills gets worse. This information is not intended to replace advice given to you by your health care provider. Make sure you discuss any questions you have with your health care provider. Document Released: 10/21/2004 Document Revised: 04/07/2016 Document Reviewed: 03/03/2016 [employer]vier Interactive Patient Education  2017 [employer]  

## 2017-06-03 ENCOUNTER — Ambulatory Visit: Payer: 59 | Admitting: Family Medicine

## 2017-06-03 LAB — QUANTIFERON IN TUBE
QFT TB AG MINUS NIL VALUE: <0 IU/mL
QUANTIFERON MITOGEN VALUE: >10 IU/mL
QUANTIFERON TB AG VALUE: 0.03 IU/mL
QUANTIFERON TB GOLD: NEGATIVE
Quantiferon Nil Value: 0.07 IU/mL

## 2017-06-03 LAB — QUANTIFERON TB GOLD ASSAY (BLOOD)

## 2017-06-07 DIAGNOSIS — K219 Gastro-esophageal reflux disease without esophagitis: Secondary | ICD-10-CM | POA: Diagnosis not present

## 2017-06-07 DIAGNOSIS — Z720 Tobacco use: Secondary | ICD-10-CM | POA: Diagnosis not present

## 2017-06-09 ENCOUNTER — Ambulatory Visit (INDEPENDENT_AMBULATORY_CARE_PROVIDER_SITE_OTHER): Payer: 59 | Admitting: Internal Medicine

## 2017-06-09 ENCOUNTER — Encounter: Payer: Self-pay | Admitting: Internal Medicine

## 2017-06-09 VITALS — BP 152/90 | HR 75 | Ht 74.0 in | Wt >= 6400 oz

## 2017-06-09 DIAGNOSIS — R05 Cough: Secondary | ICD-10-CM

## 2017-06-09 DIAGNOSIS — I10 Essential (primary) hypertension: Secondary | ICD-10-CM | POA: Diagnosis not present

## 2017-06-09 DIAGNOSIS — R9389 Abnormal findings on diagnostic imaging of other specified body structures: Secondary | ICD-10-CM

## 2017-06-09 DIAGNOSIS — F17210 Nicotine dependence, cigarettes, uncomplicated: Secondary | ICD-10-CM | POA: Diagnosis not present

## 2017-06-09 DIAGNOSIS — R053 Chronic cough: Secondary | ICD-10-CM

## 2017-06-09 DIAGNOSIS — R938 Abnormal findings on diagnostic imaging of other specified body structures: Secondary | ICD-10-CM

## 2017-06-09 MED ORDER — MINOXIDIL 2.5 MG PO TABS: 2.5000 mg | ORAL_TABLET | Freq: Every day | ORAL | 2 refills | Status: DC

## 2017-06-09 MED ORDER — IRBESARTAN 300 MG PO TABS: 300.0000 mg | ORAL_TABLET | Freq: Every day | ORAL | 11 refills | Status: DC

## 2017-06-09 NOTE — Progress Notes (Signed)
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 Pulmonary Consult    Referred by Dr. Norberto Sorenson. Pt states having hemoptysis since 05/27/17. He was dxed with having PNA.  He states every day since then he has been coughing up a moderate amounts of bright red blood. He has had some SOB off and on and also fatigue.   avg 1 tsp to  2 tbsp  05/27/17 no epistaxis  No cp/ lots of throat clearing  Finished levaquin one week prior to OV  And tapering off - mucus never purulent but did have some chills, no documented fever No assoc cp, sob over baseline mild doe he relates to wt issues = MMRC1 = can walk nl pace, flat grade, can't hurry or go uphills or steps s sob    No obvious day to day or daytime variability or assoc excess/ purulent sputum or mucus plugs or hemoptysis or cp or chest tightness, subjective wheeze or overt sinus or hb symptoms. No unusual exp hx or h/o childhood pna/ asthma or knowledge of premature birth.  Sleeping ok flat without nocturnal  or early am exacerbation  of respiratory  c/o's or need for noct saba. Also denies any obvious fluctuation of symptoms with weather or environmental changes or other aggravating or alleviating factors except as outlined above   Current Allergies, Complete Past Medical History, Past Surgical History, Family History, and Social History were reviewed in [employer] record.  ROS  The following are not active complaints unless bolded sore throat, dysphagia, dental problems, itching, sneezing,  nasal congestion or disharge of excess mucus or purulent secretions, ear ache,   fever, chills, sweats, unintended wt loss or wt gain, classically pleuritic or exertional cp,  orthopnea pnd or leg swelling, presyncope, palpitations, abdominal pain, anorexia, nausea, vomiting, diarrhea  or change in bowel habits or bladder habits, change in stools or change in urine, dysuria, hematuria,  rash, arthralgias, visual complaints, headache, numbness, weakness or ataxia or problems with walking or coordination,  change in mood/affect or memory.        Current Meds  Medication Sig  . albuterol (PROVENTIL HFA;VENTOLIN HFA) 108 (90 Base) MCG/ACT inhaler Inhale 2 puffs into the lungs every 4 (four) hours as needed for wheezing or shortness of breath (cough, shortness of breath or wheezing.).  Marland Kitchen carvedilol (COREG) 12.5 MG tablet TAKE 1 TABLET(12.5 MG) BY MOUTH TWICE DAILY WITH A MEAL  . Dextromethorphan-Guaifenesin (MUCINEX DM MAXIMUM STRENGTH) 60-1200 MG TB12 Take 1 tablet by mouth every 12 (twelve) hours.  . furosemide (LASIX) 20 MG tablet Take 1 tablet (20 mg total) by mouth daily.  . predniSONE (DELTASONE) 20 MG tablet Take 3 tabs qd x 3d, then 2 tabs qd x  3d then 1 tab qd x 3d.  . Testosterone (ANDROGEL) 20.25 MG/1.25GM (1.62%) GEL Two pumps on each shoulder daily  . valACYclovir (VALTREX) 1000 MG tablet Take 1,000 mg by mouth daily.  . [DISCONTINUED] lisinopril (PRINIVIL,ZESTRIL) 40 MG tablet TAKE 1 TABLET(40 MG) BY MOUTH DAILY         Review of Systems     Objective:   Physical Exam    massively obese pleasant amb bm nad   Wt Readings from Last 3 Encounters:  06/09/17 (!) 400 lb (181.4 kg)  06/02/17 (!) 397 lb (180.1 kg)  05/28/17 (!) 380 lb (172.4 kg)    Vital signs reviewed - Note on arrival 02 sats  97% on RA and bp 152/90   HEENT: nl  dentition, turbinates bilaterally, and oropharynx. Nl external ear canals without cough reflex   NECK :  without JVD/Nodes/TM/ nl carotid upstrokes bilaterally   LUNGS: no acc muscle use,  Nl contour chest which is clear to A and P bilaterally without cough on insp or exp maneuvers   CV:  RRR  no s3 or murmur or increase in P2, and  Chronic venous stasis/ pitting edema both lower ext L > R    ABD:  soft and nontender with nl inspiratory excursion in the supine position. No bruits or organomegaly appreciated, bowel sounds nl  MS:  Nl gait/ ext warm without deformities, calf tenderness, cyanosis or clubbing No obvious joint restrictions   SKIN: warm and dry without lesions    NEURO:  alert, approp, nl sensorium with  no motor or cerebellar deficits apparent.     I personally reviewed images and agree with radiology impression as follows:   Chest CT  05/28/17  Medial right upper lobe airspace disease in a similar location to the prior study. This likely represents a lobar pneumonia. There is opacification of the feeding bronchus which may represent mucous plugging. No definite neoplasm is present. This area is not well seen on the chest x-ray. Recommend follow-up CT of the chest without contrast at to 4 weeks after appropriate therapy. 2. No pulmonary embolus. Of note, the contrast bolus is suboptimal, limiting evaluation of segmental artery is.    Labs reviewed Lab Results  Component Value Date   HGB 15.1 06/02/2017   HGB 15.9 05/27/2017   HGB 15.3 11/09/2016   HGB 15.4 04/29/2016   HGB 15.5 07/30/2015   HGB 13.7 06/08/2014     ESR  06/02/17  = 7   Lab Results  Component Value Date   CREATININE 1.47 (H) 05/27/2017   CREATININE 1.51 (H) 11/09/2016   CREATININE 1.22 04/29/2016   CREATININE 1.22 07/30/2015   CREATININE 1.10 01/28/2015     Assessment:          Outpatient Encounter Prescriptions as of 06/09/2017  Medication Sig  . albuterol (PROVENTIL HFA;VENTOLIN  HFA) 108 (90 Base) MCG/ACT inhaler Inhale 2 puffs into the lungs every 4 (four) hours as needed for wheezing or shortness of breath (cough, shortness of breath or wheezing.).  Marland Kitchen carvedilol (COREG) 12.5 MG tablet TAKE 1 TABLET(12.5 MG) BY MOUTH TWICE DAILY WITH A MEAL  . Dextromethorphan-Guaifenesin (MUCINEX DM MAXIMUM STRENGTH) 60-1200 MG TB12 Take 1 tablet by mouth every 12 (twelve) hours.  . furosemide (LASIX) 20 MG tablet Take 1 tablet (20 mg total) by mouth daily.  . predniSONE (DELTASONE) 20 MG tablet Take 3 tabs qd x 3d, then 2 tabs qd x 3d then 1 tab qd x 3d.  Marland Kitchen  Testosterone (ANDROGEL) 20.25 MG/1.25GM (1.62%) GEL Two pumps on each shoulder daily  . valACYclovir (VALTREX) 1000 MG tablet Take 1,000 mg by mouth daily.  . [DISCONTINUED] lisinopril (PRINIVIL,ZESTRIL) 40 MG tablet TAKE 1 TABLET(40 MG) BY MOUTH DAILY  . irbesartan (AVAPRO) 300 MG tablet Take 1 tablet (300 mg total) by mouth daily.  . minoxidil (LONITEN) 2.5 MG tablet Take 1 tablet (2.5 mg total) by mouth daily.  . [DISCONTINUED] carvedilol (COREG) 12.5 MG tablet TAKE 1 TABLET BY MOUTH TWICE DAILY WITH A MEAL  . [DISCONTINUED] nicotine (NICODERM CQ) 14 mg/24hr patch Place 1 patch (14 mg total) onto the skin daily. (Patient not taking: Reported on 06/09/2017)  . [DISCONTINUED] nicotine (NICODERM CQ) 21 mg/24hr patch Please apply patch daily for 6 weeks, then apply '14mg'$  patch for 2 weeks, then apply 7 mg patch daily for 2 weeks.  Start on quit date (Patient not taking: Reported on 06/09/2017)  . [DISCONTINUED] nicotine (NICODERM CQ) 7 mg/24hr patch Place 1 patch (7 mg total) onto the skin daily. (Patient not taking: Reported on 06/09/2017)   No facility-administered encounter medications on file as of 06/09/2017.

## 2017-06-09 NOTE — Patient Instructions (Addendum)
Stop lisinopril and start avapro (ibesartan 300 mg) one daily in its place  (take one half daily if too strong)   Add minoxidil 2.5 mg daily and keep doubling it if needed until blood pressure down in the 120//80 range which is target   Please see patient coordinator before you leave today  to schedule echocardiogram    Please schedule a follow up office visit in 2 weeks to see Tammy NP , sooner if needed  with all medications /inhalers/ solutions in hand so we can verify exactly what you are taking. This includes all medications from all doctors and over the counters - needs repeat bmet and set up f/u with me in 4 weeks to discuss fob

## 2017-06-11 DIAGNOSIS — R05 Cough: Secondary | ICD-10-CM | POA: Insufficient documentation

## 2017-06-11 DIAGNOSIS — R053 Chronic cough: Secondary | ICD-10-CM | POA: Insufficient documentation

## 2017-06-11 DIAGNOSIS — R9389 Abnormal findings on diagnostic imaging of other specified body structures: Secondary | ICD-10-CM | POA: Insufficient documentation

## 2017-06-11 NOTE — Assessment & Plan Note (Signed)
CT chest  03/04/10 vs 05/28/17 very  medial RUL as changes assoc with hemoptysis and not seen on plain cxr ever  He is at risk of bronchogenic ca from smoking but this pattern would be much more suggestive of a carcinoid that intermittently bleeds (unusual though that the blood would just be in an anterior location )  Serial cxr's won't be helpful here so plan will be to get him off acei, control bp then consider fob with bx   Discussed in detail all the  indications, usual  risks and alternatives  relative to the benefits with patient who agrees to proceed with conservative f/u as outlined

## 2017-06-11 NOTE — Assessment & Plan Note (Signed)
The most common causes of chronic cough in immunocompetent adults include the following: upper airway cough syndrome (UACS), previously referred to as postnasal drip syndrome (PNDS), which is caused by variety of rhinosinus conditions; (2) asthma; (3) GERD; (4) chronic bronchitis from cigarette smoking or other inhaled environmental irritants; (5) nonasthmatic eosinophilic bronchitis; and (6) bronchiectasis.   These conditions, singly or in combination, have accounted for up to 94% of the causes of chronic cough in prospective studies.   Other conditions have constituted no >6% of the causes in prospective studies These have included bronchogenic carcinoma, chronic interstitial pneumonia, sarcoidosis, left ventricular failure, ACEI-induced cough, and aspiration from a condition associated with pharyngeal dysfunction.    Chronic cough is often simultaneously caused by more than one condition. A single cause has been found from 38 to 82% of the time, multiple causes from 18 to 62%. Multiply caused cough has been the result of three diseases up to 42% of the time.   He probably has a combination of CB from cigs and ACEi related coughing and obviously needs to stop smoking and ACEi and then regroup here to consider fob to r/o bronchogenic ca    Total time devoted to counseling  > 50 % of initial 60 min office visit:  review case with pt/ discussion of options/alternatives/ personally creating written customized instructions  in presence of pt  then going over those specific  Instructions directly with the pt including how to use all of the meds but in particular covering each new medication in detail and the difference between the maintenance= "automatic" meds and the prns using an action plan format for the latter (If this problem/symptom => do that organization reading Left to right).  Please see AVS from this visit for a full list of these instructions which I personally wrote for this pt and  are unique  to this visit.

## 2017-06-11 NOTE — Assessment & Plan Note (Signed)

## 2017-06-11 NOTE — Assessment & Plan Note (Addendum)
Trial off acei 06/09/2017 due to cough and on avapro/minoxidil   ACE inhibitors are problematic in  pts with airway complaints because  even experienced pulmonologists can't always distinguish ace effects from copd/asthma.  By themselves they don't actually cause a problem, much like oxygen can't by itself start a fire, but they certainly serve as a powerful catalyst or enhancer for any "fire"  or inflammatory process in the upper airway, be it caused by an ET  tube or more commonly reflux (especially in the obese or pts with known GERD or who are on biphoshonates).    In the era of ARB near equivalency until we have a better handle on the reversibility of the airway problem, it just makes sense to avoid ACEI  entirely in the short run and then decide later, having established a level of airway control using a reasonable limited regimen, whether to add back ace but even then being very careful to observe the pt for worsening airway control and number of meds used/ needed to control symptoms.    Try avapro 300 mg daily and minoxidil starting at 2.5 mg daily and increasing to goal of 120/80   F/u in 2 weeks with all meds in hand using a trust but verify approach to confirm accurate Medication  Reconciliation The principal here is that until we are certain that the  patients are doing what we've asked, it makes no sense to ask them to do more.   Will need recheck bmet at next ov

## 2017-06-11 NOTE — Assessment & Plan Note (Signed)
Body mass index is 51.36 kg/m.  -  trending up Lab Results  Component Value Date   TSH 0.40 04/29/2016     Contributing to gerd risk/ doe/reviewed the need and the process to achieve and maintain neg calorie balance > defer f/u primary care including intermittently monitoring thyroid status

## 2017-06-17 ENCOUNTER — Other Ambulatory Visit (HOSPITAL_COMMUNITY): Payer: Self-pay

## 2017-06-24 ENCOUNTER — Ambulatory Visit: Payer: Self-pay | Admitting: Adult Health

## 2017-06-24 DIAGNOSIS — K219 Gastro-esophageal reflux disease without esophagitis: Secondary | ICD-10-CM | POA: Diagnosis not present

## 2017-06-24 DIAGNOSIS — Z720 Tobacco use: Secondary | ICD-10-CM | POA: Diagnosis not present

## 2017-06-24 DIAGNOSIS — I10 Essential (primary) hypertension: Secondary | ICD-10-CM | POA: Diagnosis not present

## 2017-06-27 ENCOUNTER — Emergency Department (HOSPITAL_COMMUNITY): Payer: 59

## 2017-06-27 ENCOUNTER — Emergency Department (HOSPITAL_COMMUNITY)
Admission: EM | Admit: 2017-06-27 | Discharge: 2017-06-27 | Disposition: A | Discharge status: Home / Self Care | Payer: 59 | Attending: Emergency Medicine | Admitting: Emergency Medicine

## 2017-06-27 ENCOUNTER — Encounter (HOSPITAL_COMMUNITY): Payer: Self-pay

## 2017-06-27 DIAGNOSIS — R05 Cough: Secondary | ICD-10-CM | POA: Diagnosis not present

## 2017-06-27 DIAGNOSIS — Z5321 Procedure and treatment not carried out due to patient leaving prior to being seen by health care provider: Secondary | ICD-10-CM | POA: Diagnosis not present

## 2017-06-27 DIAGNOSIS — R079 Chest pain, unspecified: Secondary | ICD-10-CM | POA: Diagnosis present

## 2017-06-27 DIAGNOSIS — R9431 Abnormal electrocardiogram [ECG] [EKG]: Secondary | ICD-10-CM | POA: Diagnosis not present

## 2017-06-27 LAB — BASIC METABOLIC PANEL
Anion gap: 8 (ref 5–15)
BUN: 21 mg/dL — AB (ref 6–20)
CHLORIDE: 105 mmol/L (ref 101–111)
CO2: 26 mmol/L (ref 22–32)
Calcium: 9 mg/dL (ref 8.9–10.3)
Creatinine, Ser: 1.67 mg/dL — ABNORMAL HIGH (ref 0.61–1.24)
GFR calc Af Amer: 59 mL/min — ABNORMAL LOW (ref >60)
GFR calc non Af Amer: 51 mL/min — ABNORMAL LOW (ref >60)
Glucose, Bld: 111 mg/dL — ABNORMAL HIGH (ref 65–99)
POTASSIUM: 3.8 mmol/L (ref 3.5–5.1)
SODIUM: 139 mmol/L (ref 135–145)

## 2017-06-27 LAB — CBC
HEMATOCRIT: 42 % (ref 39.0–52.0)
Hemoglobin: 14.6 g/dL (ref 13.0–17.0)
MCH: 29.3 pg (ref 26.0–34.0)
MCHC: 34.8 g/dL (ref 30.0–36.0)
MCV: 84.2 fL (ref 78.0–100.0)
Platelets: 255 10*3/uL (ref 150–400)
RBC: 4.99 MIL/uL (ref 4.22–5.81)
RDW: 12.6 % (ref 11.5–15.5)
WBC: 9.6 10*3/uL (ref 4.0–10.5)

## 2017-06-27 LAB — POCT I-STAT TROPONIN I: Troponin i, poc: 0.01 ng/mL (ref 0.00–0.08)

## 2017-06-27 IMAGING — CR DG CHEST 2V
2 series · 2 of 2 positions shown · non-contrast
Comparison: None 618

CLINICAL DATA: Right-sided chest pain tonight. Cough and
congestion. History of hypertension. Smoker.

EXAM:
CHEST  2 VIEW

[w chest pa]
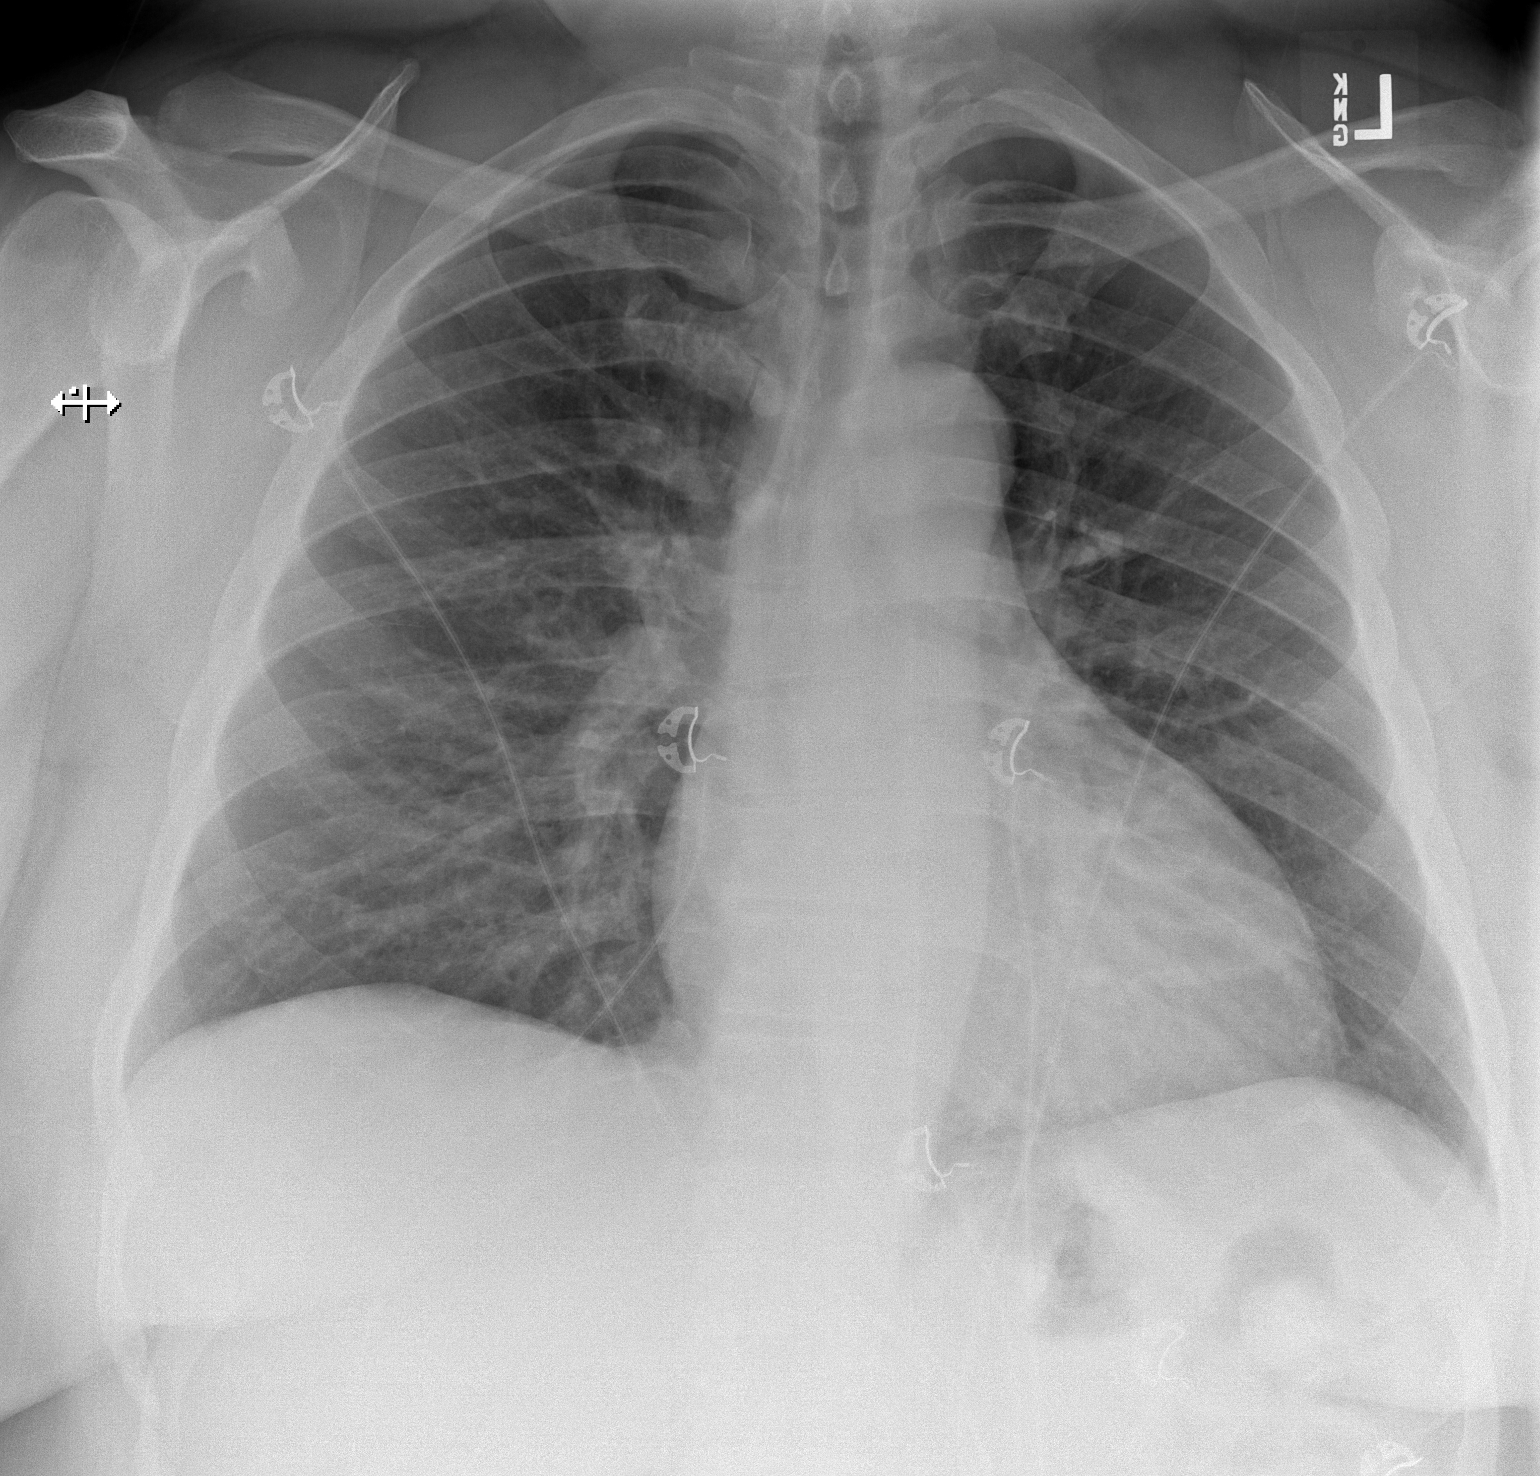

[w chest lat]
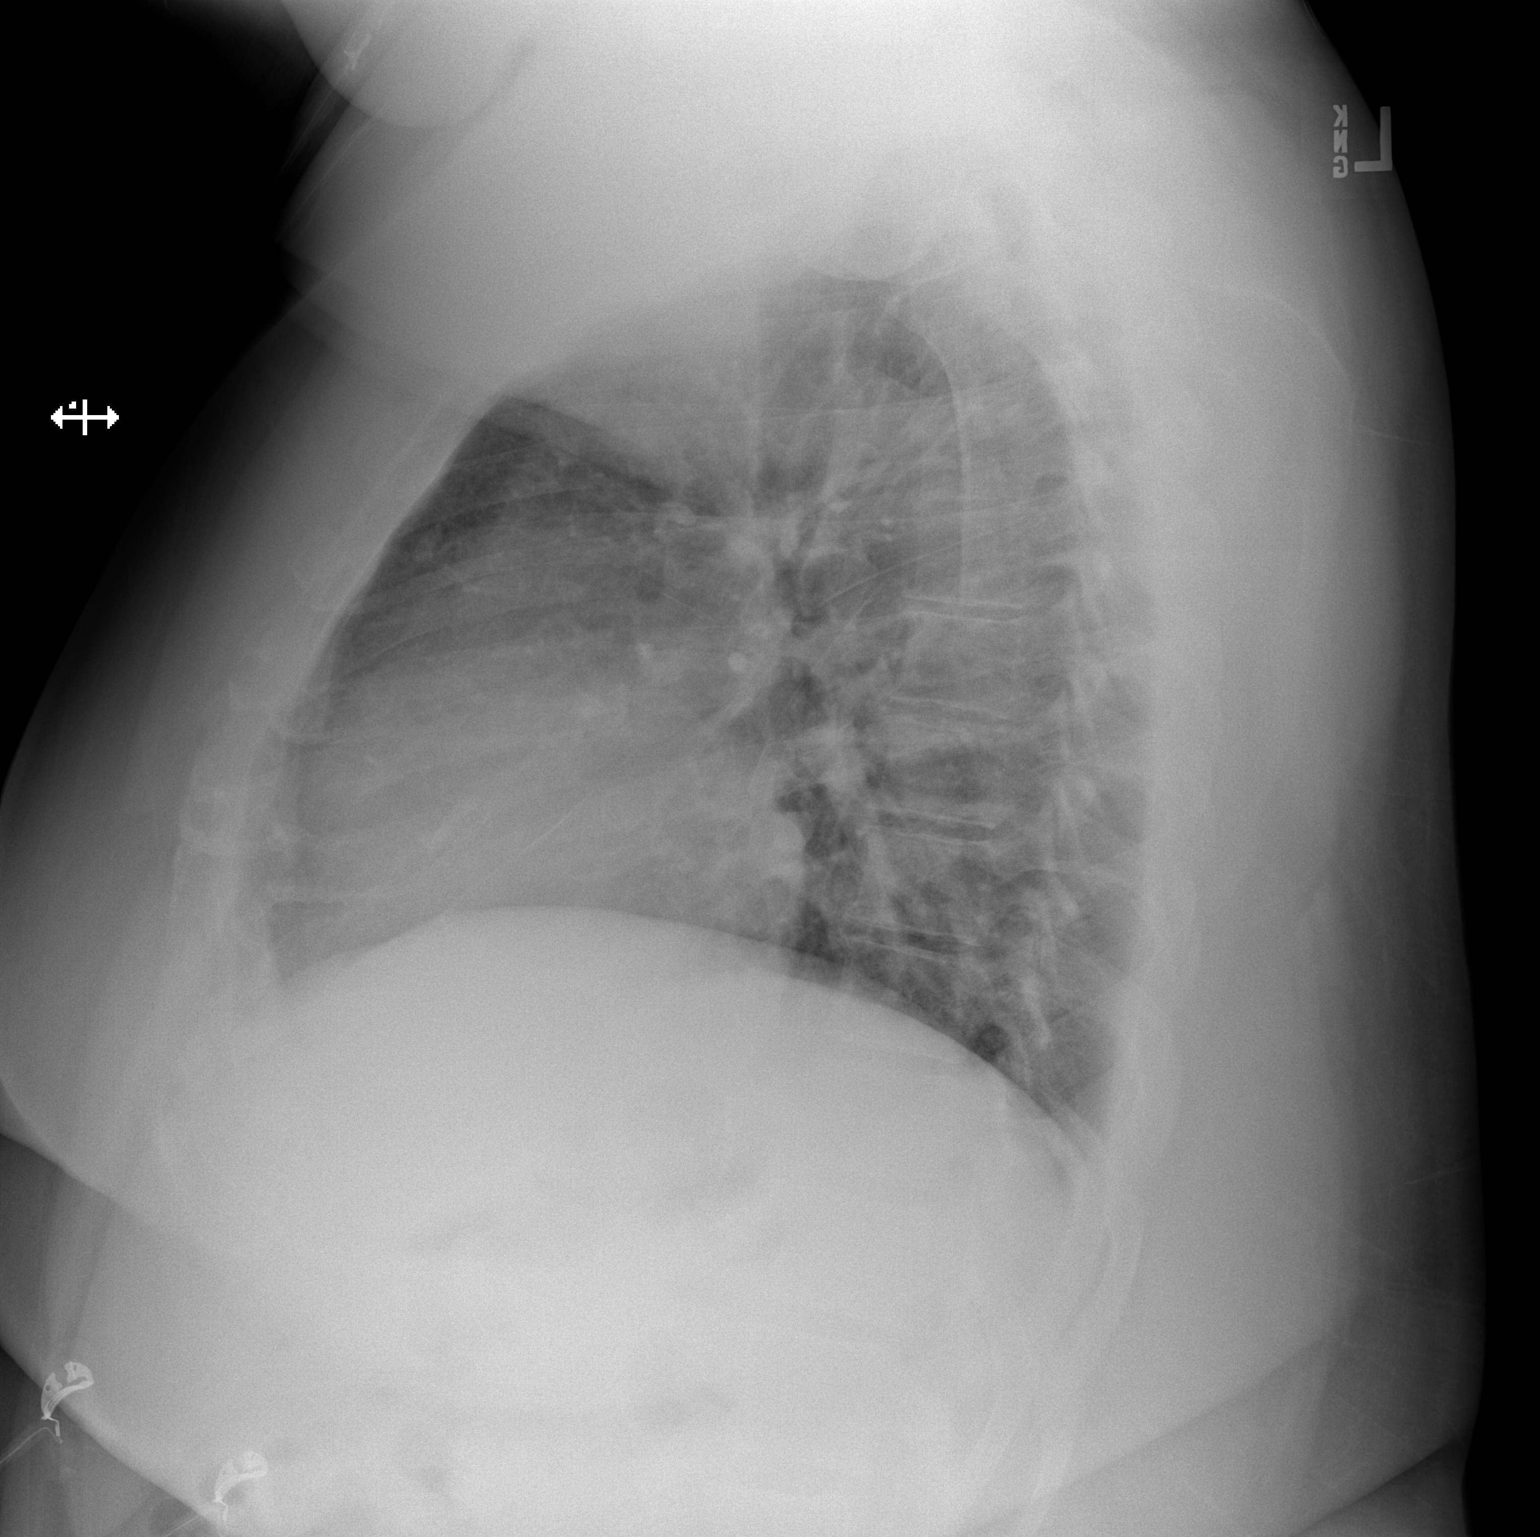

[2 of 2 positions shown; findings below may reference images not displayed]

FINDINGS: The heart size and mediastinal contours are within normal limits.
Both lungs are clear. The visualized skeletal structures are
unremarkable.
IMPRESSION: No active cardiopulmonary disease.

## 2017-06-27 NOTE — ED Triage Notes (Signed)
Pt states that he is experiencing R sided chest pain that radiated up to his jaw since 8p last night. Pt states that he finished working out around 7p last night. A&Ox4. Denies SOB, N/V/D. Ambulatory.

## 2017-06-27 NOTE — ED Notes (Signed)
Called to reassess vitals x 1

## 2017-06-27 NOTE — ED Notes (Signed)
Called to reassess vitals x2

## 2017-06-27 NOTE — ED Notes (Signed)
Called to reassess vitals x3.

## 2017-06-27 NOTE — ED Notes (Signed)
Pt LWBS d/t wait time.  

## 2017-07-02 ENCOUNTER — Encounter (HOSPITAL_COMMUNITY): Payer: Self-pay

## 2017-07-02 ENCOUNTER — Emergency Department (HOSPITAL_COMMUNITY)
Admission: EM | Admit: 2017-07-02 | Discharge: 2017-07-03 | Disposition: A | Discharge status: Home / Self Care | Payer: 59 | Attending: Emergency Medicine | Admitting: Emergency Medicine

## 2017-07-02 DIAGNOSIS — R109 Unspecified abdominal pain: Secondary | ICD-10-CM | POA: Diagnosis present

## 2017-07-02 DIAGNOSIS — R112 Nausea with vomiting, unspecified: Secondary | ICD-10-CM | POA: Diagnosis not present

## 2017-07-02 DIAGNOSIS — Z5321 Procedure and treatment not carried out due to patient leaving prior to being seen by health care provider: Secondary | ICD-10-CM | POA: Insufficient documentation

## 2017-07-02 DIAGNOSIS — R42 Dizziness and giddiness: Secondary | ICD-10-CM | POA: Insufficient documentation

## 2017-07-02 LAB — URINALYSIS, ROUTINE W REFLEX MICROSCOPIC
Bilirubin Urine: NEGATIVE
Glucose, UA: NEGATIVE mg/dL
Hgb urine dipstick: NEGATIVE
KETONES UR: NEGATIVE mg/dL
LEUKOCYTES UA: NEGATIVE
NITRITE: NEGATIVE
PROTEIN: NEGATIVE mg/dL
Specific Gravity, Urine: 1.021 (ref 1.005–1.030)
pH: 6 (ref 5.0–8.0)

## 2017-07-02 LAB — CBC
HCT: 44.6 % (ref 39.0–52.0)
Hemoglobin: 15.4 g/dL (ref 13.0–17.0)
MCH: 29.7 pg (ref 26.0–34.0)
MCHC: 34.5 g/dL (ref 30.0–36.0)
MCV: 86.1 fL (ref 78.0–100.0)
PLATELETS: 252 10*3/uL (ref 150–400)
RBC: 5.18 MIL/uL (ref 4.22–5.81)
RDW: 12.6 % (ref 11.5–15.5)
WBC: 15.9 10*3/uL — AB (ref 4.0–10.5)

## 2017-07-02 LAB — COMPREHENSIVE METABOLIC PANEL
ALT: 23 U/L (ref 17–63)
AST: 16 U/L (ref 15–41)
Albumin: 4.3 g/dL (ref 3.5–5.0)
Alkaline Phosphatase: 80 U/L (ref 38–126)
Anion gap: 9 (ref 5–15)
BILIRUBIN TOTAL: 0.8 mg/dL (ref 0.3–1.2)
BUN: 17 mg/dL (ref 6–20)
CALCIUM: 9.3 mg/dL (ref 8.9–10.3)
CHLORIDE: 102 mmol/L (ref 101–111)
CO2: 29 mmol/L (ref 22–32)
Creatinine, Ser: 1.49 mg/dL — ABNORMAL HIGH (ref 0.61–1.24)
GFR, EST NON AFRICAN AMERICAN: 58 mL/min — AB (ref >60)
Glucose, Bld: 110 mg/dL — ABNORMAL HIGH (ref 65–99)
Potassium: 4.3 mmol/L (ref 3.5–5.1)
Sodium: 140 mmol/L (ref 135–145)
TOTAL PROTEIN: 7.6 g/dL (ref 6.5–8.1)

## 2017-07-02 LAB — LIPASE, BLOOD: LIPASE: 80 U/L — AB (ref 11–51)

## 2017-07-02 MED ORDER — ONDANSETRON 4 MG PO TBDP
4.0000 mg | ORAL_TABLET | Freq: Once | ORAL | Status: AC
  Administered 2017-07-02: 4 mg via ORAL
  Filled 2017-07-02: qty 1

## 2017-07-02 NOTE — ED Triage Notes (Signed)
Pt presents with lower abdominal pain, n/v/d for about 3 days and attributes this to starting new medications, Minidoxidil and irebesartan. Pt states "he was told to take more pills if blood pressure didn't come down, and took 4 pills today".  Blood pressure is 187/97

## 2017-07-02 NOTE — ED Notes (Signed)
Pt had drawn for labs:  Blue Gold Lavender Lt green Dark green x2

## 2017-07-03 NOTE — ED Notes (Signed)
No answer when called for room x 3 

## 2017-07-04 ENCOUNTER — Institutional Professional Consult (permissible substitution): Payer: Self-pay | Admitting: Internal Medicine

## 2017-07-08 ENCOUNTER — Ambulatory Visit
Admission: RE | Admit: 2017-07-08 | Discharge: 2017-07-08 | Disposition: A | Discharge status: Home / Self Care | Payer: 59 | Source: Ambulatory Visit | Attending: Family Medicine | Admitting: Family Medicine

## 2017-07-08 DIAGNOSIS — J189 Pneumonia, unspecified organism: Secondary | ICD-10-CM

## 2017-07-08 DIAGNOSIS — R079 Chest pain, unspecified: Secondary | ICD-10-CM | POA: Diagnosis not present

## 2017-07-08 DIAGNOSIS — G4733 Obstructive sleep apnea (adult) (pediatric): Secondary | ICD-10-CM

## 2017-07-08 DIAGNOSIS — J9811 Atelectasis: Secondary | ICD-10-CM

## 2017-07-08 DIAGNOSIS — J181 Lobar pneumonia, unspecified organism: Principal | ICD-10-CM

## 2017-07-08 DIAGNOSIS — F17200 Nicotine dependence, unspecified, uncomplicated: Secondary | ICD-10-CM

## 2017-07-08 DIAGNOSIS — R042 Hemoptysis: Secondary | ICD-10-CM

## 2017-07-08 IMAGING — CT CT CHEST W/O CM
1 of 2 series · 14 of 32 positions shown, 18 images · non-contrast
Comparison: Chest radiograph, [DATE].  Chest CT, [DATE].

CLINICAL DATA: Rt sided chest pain and difficulty breathing x 2
weeks agoSmoker no surgNo hx ca

EXAM:
CT CHEST WITHOUT CONTRAST
TECHNIQUE: Multidetector CT imaging of the chest was performed following the
standard protocol without IV contrast.

[Series 3: chest w/o · axial · non-contrast · 0.98mm/px · z∈[-313,-31]mm · 14 of 135 slices shown, 18 images]
[im 11/135  mediastinal]
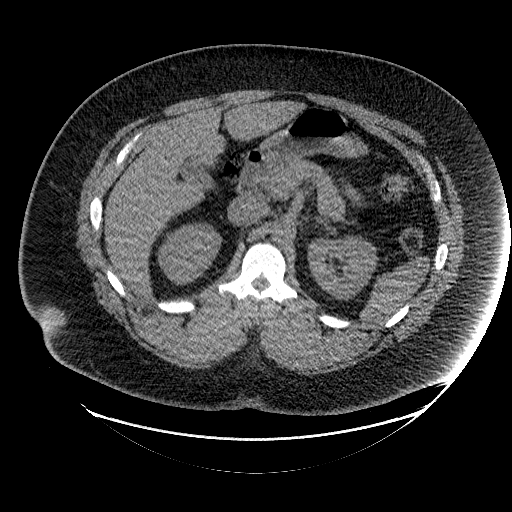
[im 11/135  lung]
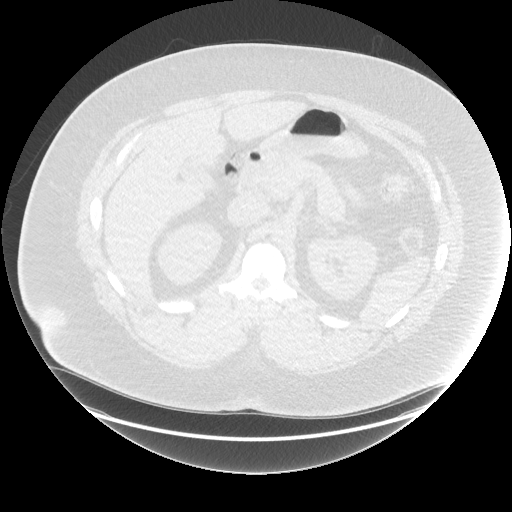
[im 21/135  lung]
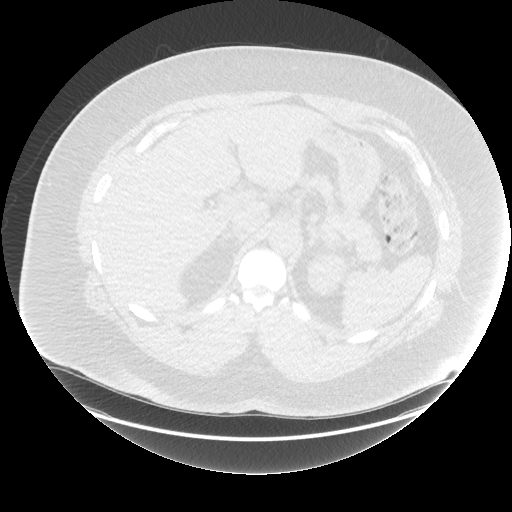
[im 31/135  lung]
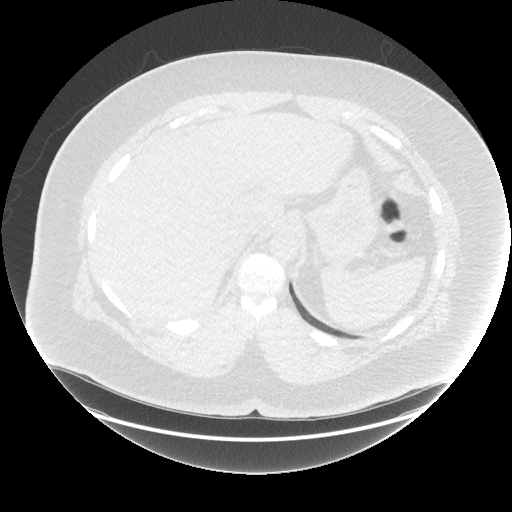
[im 42/135  lung]
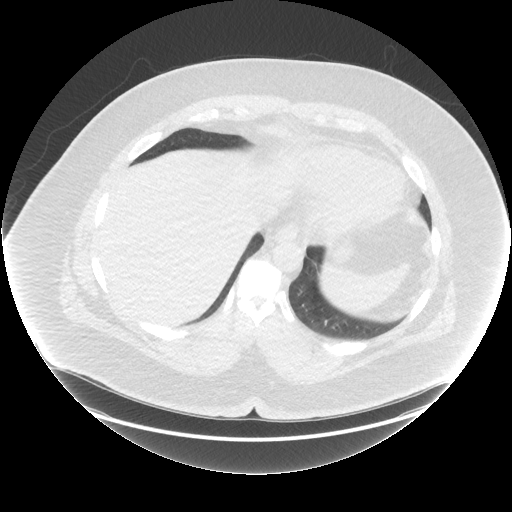
[im 52/135  mediastinal]
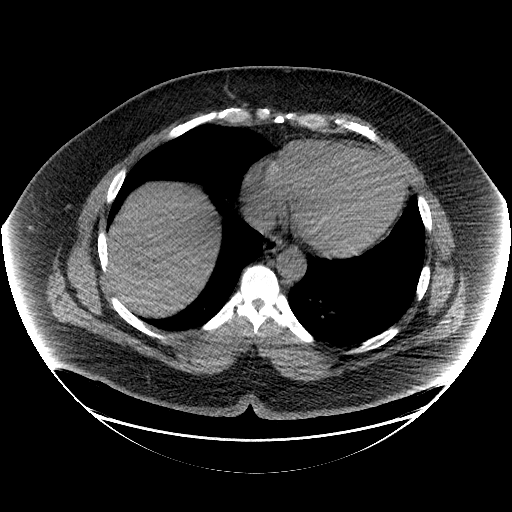
[im 52/135  lung]
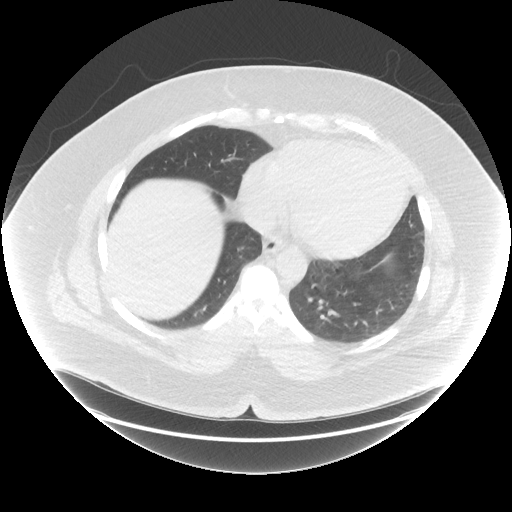
[im 62/135  lung]
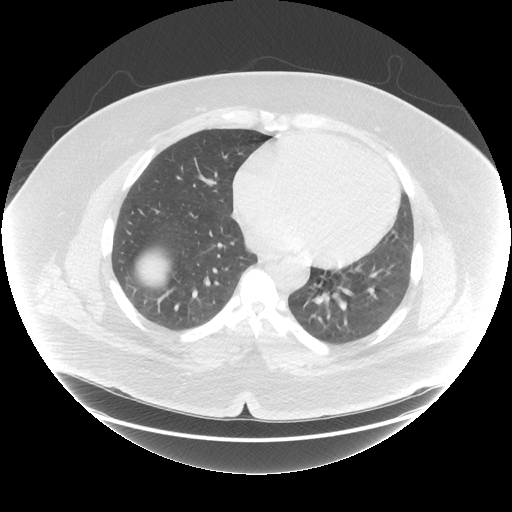
[im 64/135  lung]
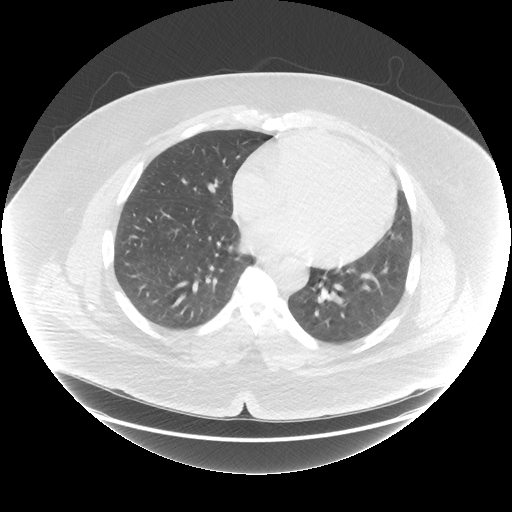
[im 68/135  lung]
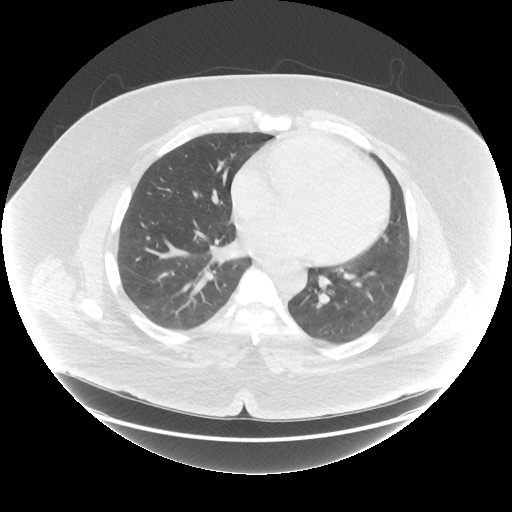
[im 73/135  mediastinal]
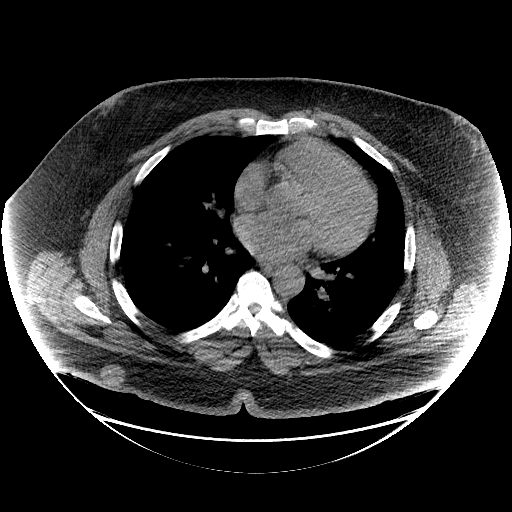
[im 73/135  lung]
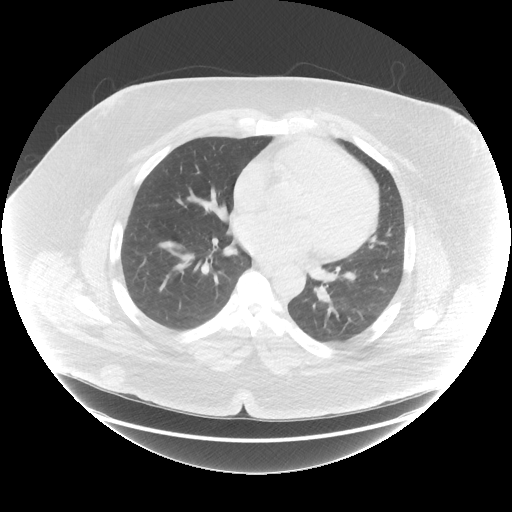
[im 83/135  lung]
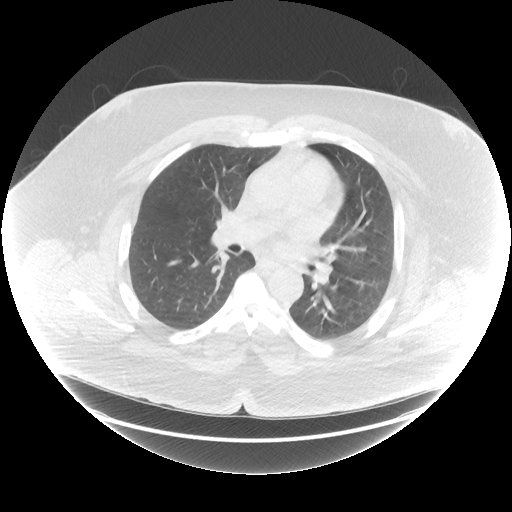
[im 93/135  lung]
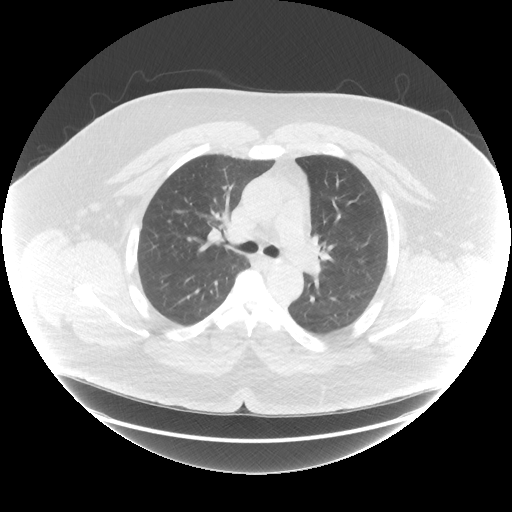
[im 104/135  lung]
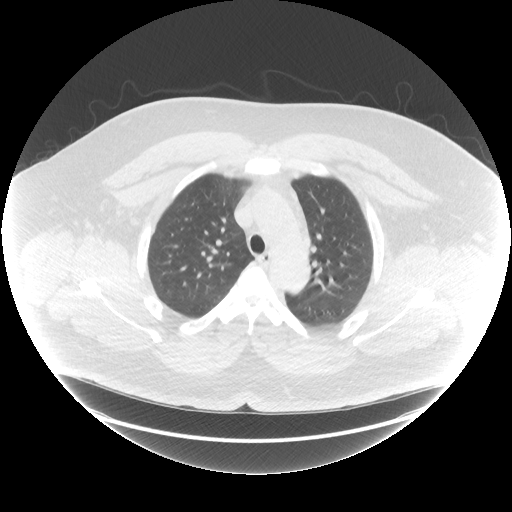
[im 114/135  mediastinal]
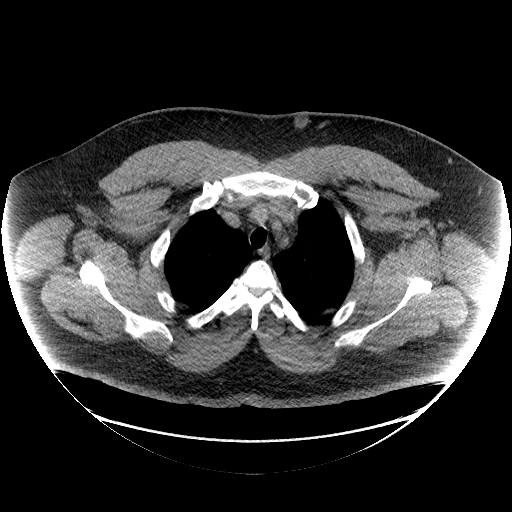
[im 114/135  lung]
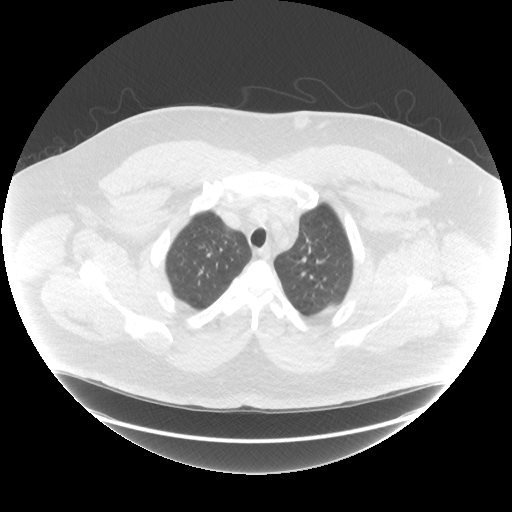
[im 124/135  lung]
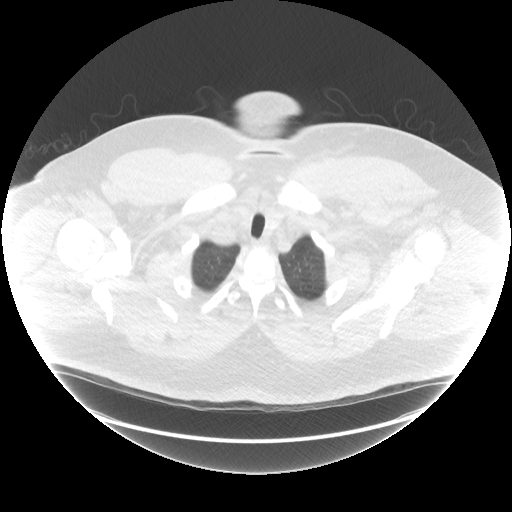

[14 of 32 positions shown; findings below may reference images not displayed]

FINDINGS: Cardiovascular: Heart is normal in size and configuration. No
significant coronary artery calcifications. Great vessels are normal
in caliber. No aortic atherosclerosis.

Mediastinum/Nodes: Visualized thyroid is unremarkable. No neck base
or axillary masses or enlarged lymph nodes. No mediastinal or hilar
masses or adenopathy. Trachea and esophagus are unremarkable.

Lungs/Pleura: Right upper lobe pneumonia noted on the prior CT has
resolved. Lungs are now clear. No pleural effusion. No pneumothorax.

Upper Abdomen: Negative.

Musculoskeletal: No fracture or acute finding. No osteoblastic or
osteolytic lesions.
IMPRESSION: 1. Resolved right upper lobe pneumonia.  Lungs are now clear.

## 2017-07-15 ENCOUNTER — Ambulatory Visit (HOSPITAL_COMMUNITY): Payer: 59 | Attending: Cardiovascular Disease

## 2017-07-15 ENCOUNTER — Other Ambulatory Visit: Payer: Self-pay

## 2017-07-15 DIAGNOSIS — G4733 Obstructive sleep apnea (adult) (pediatric): Secondary | ICD-10-CM | POA: Insufficient documentation

## 2017-07-15 DIAGNOSIS — I10 Essential (primary) hypertension: Secondary | ICD-10-CM | POA: Insufficient documentation

## 2017-07-15 DIAGNOSIS — I517 Cardiomegaly: Secondary | ICD-10-CM | POA: Insufficient documentation

## 2017-07-15 DIAGNOSIS — Z720 Tobacco use: Secondary | ICD-10-CM | POA: Diagnosis not present

## 2017-07-15 MED ORDER — PERFLUTREN LIPID MICROSPHERE
1.0000 mL | INTRAVENOUS | Status: AC | PRN
  Administered 2017-07-15: 3 mL via INTRAVENOUS

## 2017-07-15 NOTE — Progress Notes (Signed)
Spoke with pt and notified of results per Dr. Wert. Pt verbalized understanding and denied any questions. 

## 2017-07-29 DIAGNOSIS — I10 Essential (primary) hypertension: Secondary | ICD-10-CM | POA: Diagnosis not present

## 2017-08-02 DIAGNOSIS — Z7189 Other specified counseling: Secondary | ICD-10-CM | POA: Diagnosis not present

## 2017-10-07 DIAGNOSIS — I119 Hypertensive heart disease without heart failure: Secondary | ICD-10-CM | POA: Insufficient documentation

## 2017-11-12 ENCOUNTER — Ambulatory Visit: Payer: 59 | Admitting: Family Medicine

## 2018-01-04 ENCOUNTER — Encounter: Payer: Self-pay | Admitting: Physician Assistant

## 2018-01-23 DIAGNOSIS — G4733 Obstructive sleep apnea (adult) (pediatric): Secondary | ICD-10-CM | POA: Diagnosis not present

## 2018-01-31 ENCOUNTER — Other Ambulatory Visit: Payer: Self-pay | Admitting: Physician Assistant

## 2018-02-08 ENCOUNTER — Other Ambulatory Visit: Payer: Self-pay | Admitting: Physician Assistant

## 2018-02-15 ENCOUNTER — Encounter: Payer: Self-pay | Admitting: Urgent Care

## 2018-02-15 ENCOUNTER — Ambulatory Visit: Payer: 59 | Admitting: Urgent Care

## 2018-02-15 VITALS — BP 182/108 | HR 90 | Temp 98.6°F | Resp 17 | Ht 74.0 in | Wt 369.0 lb

## 2018-02-15 DIAGNOSIS — G4733 Obstructive sleep apnea (adult) (pediatric): Secondary | ICD-10-CM | POA: Diagnosis not present

## 2018-02-15 DIAGNOSIS — I10 Essential (primary) hypertension: Secondary | ICD-10-CM | POA: Diagnosis not present

## 2018-02-15 DIAGNOSIS — R030 Elevated blood-pressure reading, without diagnosis of hypertension: Secondary | ICD-10-CM

## 2018-02-15 DIAGNOSIS — N529 Male erectile dysfunction, unspecified: Secondary | ICD-10-CM

## 2018-02-15 DIAGNOSIS — E291 Testicular hypofunction: Secondary | ICD-10-CM | POA: Diagnosis not present

## 2018-02-15 MED ORDER — IRBESARTAN 300 MG PO TABS: 300.0000 mg | ORAL_TABLET | Freq: Every day | ORAL | 1 refills | Status: DC

## 2018-02-15 MED ORDER — AMLODIPINE BESYLATE 5 MG PO TABS: 5.0000 mg | ORAL_TABLET | Freq: Every day | ORAL | 3 refills | Status: DC

## 2018-02-15 MED ORDER — CARVEDILOL 12.5 MG PO TABS: ORAL_TABLET | ORAL | 1 refills | Status: DC

## 2018-02-15 MED ORDER — FUROSEMIDE 20 MG PO TABS: 20.0000 mg | ORAL_TABLET | Freq: Every day | ORAL | 5 refills | Status: DC

## 2018-02-15 NOTE — Patient Instructions (Addendum)
Salads - kale, spinach, cabbage, spring mix; use seeds like pumpkin seeds or sunflower seeds; you can also use 1-2 hard boiled eggs. Fruits - avocadoes, berries (blueberries, raspberries, blackberries), apples, oranges, pomegranate, grapefruit Seeds - quinoa, chia seeds; you can also incorporate oatmeal Vegetables - aspargus, cauliflower, broccoli, green beans, brussel spouts, bell peppers; stay away from starchy vegetables like potatoes, carrots, peas  Brussel sprouts - Cut off stems. Place in a mixing bowl that has a lid. Pour in a 1/4-1/2 cup olive oil, spices, use a light amount of parmesan. Place on a baking sheet. Bake for 10 minutes at 400F. Take it out, eat the brussel chips. Place for another 5-10 minutes.   Mashed cauliflower - Boil a bunch of cauliflower in a pot of water. Blend in a food processor with 1-2 tablespoons of butter.  Spaghetti squash -  Cut the squash in half very carefully, clean out seeds from the middle. Place 1/2 face down in a microwave safe dish with at least 2 inches of water. Make 4-6 slits on outside of spaghetti squash and microwave for 10-12 minutes. Take out the spaghetti using a metal spoon. Repeat for the other half.   Vega protein is good protein powder, make sure you use ~6 ice cubes to give it smoothie consistency together with ~4-6 ounces of vanilla soy milk. Throw cinnamon into your shake, use peanut butter. You can also use the fruits that I listed above. Throw spinach or kale into the shake.      Hypertension Hypertension, commonly called high blood pressure, is when the force of blood pumping through the arteries is too strong. The arteries are the blood vessels that carry blood from the heart throughout the body. Hypertension forces the heart to work harder to pump blood and may cause arteries to become narrow or stiff. Having untreated or uncontrolled hypertension can cause heart attacks, strokes, kidney disease, and other problems. A blood pressure  reading consists of a higher number over a lower number. Ideally, your blood pressure should be below 120/80. The first ("top") number is called the systolic pressure. It is a measure of the pressure in your arteries as your heart beats. The second ("bottom") number is called the diastolic pressure. It is a measure of the pressure in your arteries as the heart relaxes. What are the causes? The cause of this condition is not known. What increases the risk? Some risk factors for high blood pressure are under your control. Others are not. Factors you can change  Smoking.  Having type 2 diabetes mellitus, high cholesterol, or both.  Not getting enough exercise or physical activity.  Being overweight.  Having too much fat, sugar, calories, or salt (sodium) in your diet.  Drinking too much alcohol. Factors that are difficult or impossible to change  Having chronic kidney disease.  Having a family history of high blood pressure.  Age. Risk increases with age.  Race. You may be at higher risk if you are African-American.  Gender. Men are at higher risk than women before age 53. After age 9, women are at higher risk than men.  Having obstructive sleep apnea.  Stress. What are the signs or symptoms? Extremely high blood pressure (hypertensive crisis) may cause:  Headache.  Anxiety.  Shortness of breath.  Nosebleed.  Nausea and vomiting.  Severe chest pain.  Jerky movements you cannot control (seizures).  How is this diagnosed? This condition is diagnosed by measuring your blood pressure while you are seated, with your  arm resting on a surface. The cuff of the blood pressure monitor will be placed directly against the skin of your upper arm at the level of your heart. It should be measured at least twice using the same arm. Certain conditions can cause a difference in blood pressure between your right and left arms. Certain factors can cause blood pressure readings to be  lower or higher than normal (elevated) for a short period of time:  When your blood pressure is higher when you are in a health care provider's office than when you are at home, this is called white coat hypertension. Most people with this condition do not need medicines.  When your blood pressure is higher at home than when you are in a health care provider's office, this is called masked hypertension. Most people with this condition may need medicines to control blood pressure.  If you have a high blood pressure reading during one visit or you have normal blood pressure with other risk factors:  You may be asked to return on a different day to have your blood pressure checked again.  You may be asked to monitor your blood pressure at home for 1 week or longer.  If you are diagnosed with hypertension, you may have other blood or imaging tests to help your health care provider understand your overall risk for other conditions. How is this treated? This condition is treated by making healthy lifestyle changes, such as eating healthy foods, exercising more, and reducing your alcohol intake. Your health care provider may prescribe medicine if lifestyle changes are not enough to get your blood pressure under control, and if:  Your systolic blood pressure is above 130.  Your diastolic blood pressure is above 80.  Your personal target blood pressure may vary depending on your medical conditions, your age, and other factors. Follow these instructions at home: Eating and drinking  Eat a diet that is high in fiber and potassium, and low in sodium, added sugar, and fat. An example eating plan is called the DASH (Dietary Approaches to Stop Hypertension) diet. To eat this way: ? Eat plenty of fresh fruits and vegetables. Try to fill half of your plate at each meal with fruits and vegetables. ? Eat whole grains, such as whole wheat pasta, brown rice, or whole grain bread. Fill about one quarter of your  plate with whole grains. ? Eat or drink low-fat dairy products, such as skim milk or low-fat yogurt. ? Avoid fatty cuts of meat, processed or cured meats, and poultry with skin. Fill about one quarter of your plate with lean proteins, such as fish, chicken without skin, beans, eggs, and tofu. ? Avoid premade and processed foods. These tend to be higher in sodium, added sugar, and fat.  Reduce your daily sodium intake. Most people with hypertension should eat less than 1,500 mg of sodium a day.  Limit alcohol intake to no more than 1 drink a day for nonpregnant women and 2 drinks a day for men. One drink equals 12 oz of beer, 5 oz of wine, or 1 oz of hard liquor. Lifestyle  Work with your health care provider to maintain a healthy body weight or to lose weight. Ask what an ideal weight is for you.  Get at least 30 minutes of exercise that causes your heart to beat faster (aerobic exercise) most days of the week. Activities may include walking, swimming, or biking.  Include exercise to strengthen your muscles (resistance exercise), such as pilates  or lifting weights, as part of your weekly exercise routine. Try to do these types of exercises for 30 minutes at least 3 days a week.  Do not use any products that contain nicotine or tobacco, such as cigarettes and e-cigarettes. If you need help quitting, ask your health care provider.  Monitor your blood pressure at home as told by your health care provider.  Keep all follow-up visits as told by your health care provider. This is important. Medicines  Take over-the-counter and prescription medicines only as told by your health care provider. Follow directions carefully. Blood pressure medicines must be taken as prescribed.  Do not skip doses of blood pressure medicine. Doing this puts you at risk for problems and can make the medicine less effective.  Ask your health care provider about side effects or reactions to medicines that you should  watch for. Contact a health care provider if:  You think you are having a reaction to a medicine you are taking.  You have headaches that keep coming back (recurring).  You feel dizzy.  You have swelling in your ankles.  You have trouble with your vision. Get help right away if:  You develop a severe headache or confusion.  You have unusual weakness or numbness.  You feel faint.  You have severe pain in your chest or abdomen.  You vomit repeatedly.  You have trouble breathing. Summary  Hypertension is when the force of blood pumping through your arteries is too strong. If this condition is not controlled, it may put you at risk for serious complications.  Your personal target blood pressure may vary depending on your medical conditions, your age, and other factors. For most people, a normal blood pressure is less than 120/80.  Hypertension is treated with lifestyle changes, medicines, or a combination of both. Lifestyle changes include weight loss, eating a healthy, low-sodium diet, exercising more, and limiting alcohol. This information is not intended to replace advice given to you by your health care provider. Make sure you discuss any questions you have with your health care provider. Document Released: 09/13/2005 Document Revised: 08/11/2016 Document Reviewed: 08/11/2016 Elsevier Interactive Patient Education  2018 [employer].     IF you received an x-ray today, you will receive an invoice from [facility]. Please contact [facility] at [phone] with questions or concerns regarding your invoice.   IF you received labwork today, you will receive an invoice from [facility]. Please contact [facility] at [phone] with questions or concerns regarding your invoice.   Our billing staff will not be able to assist you with questions regarding bills from these companies.  You will be contacted with the lab results as soon as they are available. The  fastest way to get your results is to activate your My Chart account. Instructions are located on the last page of this paperwork. If you have not heard from [place] regarding the results in 2 weeks, please contact this office.

## 2018-02-15 NOTE — Progress Notes (Signed)
    MRN: [id number] DOB: Jun 02, 1981  Subjective:   Calvin Bailey is a 37 y.o. male presenting for follow up on Hypertension. Currently managed with irbesartan. Has previously been treated with chlorthalidone, lisinopril, carvedilol. He stopped chlorthalidone and lisinopril due to adverse reactions. He ran out of Lasix and needs a refill. Patient is checking blood pressure at home, generally 572'I systolic. Denies dizziness, chronic headache, blurred vision, chest pain, shortness of breath, heart racing, palpitations, nausea, vomiting, abdominal pain, hematuria, lower leg swelling.  Patient uses CPAP, has new supplies that he obtained last week. Uses testosterone gel for low testosterone, is being followed by [facility]. Smokes ~3 cigarettes, is working on cutting back. Drinks 1-2 times per month, has a glass of wine. Tries to eat healthily most of the time.   Calvin Bailey has a current medication list which includes the following prescription(s): carvedilol, mucinex dm maximum strength, furosemide, irbesartan, testosterone, valacyclovir, and lisinopril-hydrochlorothiazide. Also is allergic to chlorthalidone.  Calvin Bailey  has a past medical history of GERD (gastroesophageal reflux disease), HTN (hypertension), Obesity hypoventilation syndrome ([place]) (04/08/2014), OSA (obstructive sleep apnea) (04/08/2014), and Sleep apnea. Also  has a past surgical history that includes two knee surgeries (Left, 1995).  Objective:   Vitals: BP (!) 182/108   Pulse 90   Temp 98.6 F (37 C) (Oral)   Resp 17   Ht 6\' 2"  (1.88 m)   Wt (!) 369 lb (167.4 kg)   SpO2 98%   BMI 47.38 kg/m   BP Readings from Last 3 Encounters:  02/15/18 (!) 182/108  06/27/17 (!) 185/99  06/09/17 (!) 152/90    Physical Exam  Constitutional: He is oriented to person, place, and time. He appears well-developed and well-nourished.  Morbid obesity.  Eyes: Right eye exhibits no discharge. Left eye exhibits no discharge. No scleral icterus.    Cardiovascular: Normal rate, regular rhythm and intact distal pulses. Exam reveals no gallop and no friction rub.  No murmur heard. Pulmonary/Chest: No respiratory distress. He has no wheezes. He has no rales.  Neurological: He is alert and oriented to person, place, and time.  Skin: Skin is warm and dry.  Psychiatric: He has a normal mood and affect.   Assessment and Plan :   Essential hypertension - Plan: Comprehensive metabolic panel, Microalbumin / creatinine urine ratio, carvedilol (COREG) 12.5 MG tablet  Elevated blood pressure reading  Morbid obesity (HCC) - Plan: Lipid panel, Hemoglobin A1c  OSA (obstructive sleep apnea)  Hypogonadism in male  Erectile dysfunction, unspecified erectile dysfunction type  Patient will work with me consistently to get his blood pressure under control.  Counseled on healthy lifestyle, encourage patient to continue to efforts at quitting smoking.  Will restart his blood pressure medication Lasix, carvedilol and he is to maintain his irbesartan.  I refilled his amlodipine at 5 mg.  Counseled that he needs to contact his urologist to see if there is an alternative for him to use instead of testosterone gel to avoid this is a source of his persistent high blood pressure.  He is to maintain follow-up as he has with the sleep center for his OSA.  We will follow-up in 4 weeks.  Counseled patient on potential for side effects of medications prescribed today.  Return to clinic precautions discussed.  Jaynee Eagles, PA-C [facility] [phone] 02/15/2018  3:54 PM

## 2018-02-16 ENCOUNTER — Ambulatory Visit: Payer: 59 | Admitting: Urgent Care

## 2018-02-16 LAB — COMPREHENSIVE METABOLIC PANEL
A/G RATIO: 1.3 (ref 1.2–2.2)
ALBUMIN: 4 g/dL (ref 3.5–5.5)
ALT: 19 IU/L (ref 0–44)
AST: 14 IU/L (ref 0–40)
Alkaline Phosphatase: 88 IU/L (ref 39–117)
BUN/Creatinine Ratio: 13 (ref 9–20)
BUN: 23 mg/dL — ABNORMAL HIGH (ref 6–20)
Bilirubin Total: 0.4 mg/dL (ref 0.0–1.2)
CALCIUM: 9.2 mg/dL (ref 8.7–10.2)
CO2: 23 mmol/L (ref 20–29)
CREATININE: 1.77 mg/dL — AB (ref 0.76–1.27)
Chloride: 105 mmol/L (ref 96–106)
GFR, EST AFRICAN AMERICAN: 55 mL/min/{1.73_m2} — AB (ref >59)
GFR, EST NON AFRICAN AMERICAN: 48 mL/min/{1.73_m2} — AB (ref >59)
Globulin, Total: 3.1 g/dL (ref 1.5–4.5)
Glucose: 98 mg/dL (ref 65–99)
Potassium: 4 mmol/L (ref 3.5–5.2)
Sodium: 142 mmol/L (ref 134–144)
TOTAL PROTEIN: 7.1 g/dL (ref 6.0–8.5)

## 2018-02-16 LAB — LIPID PANEL
CHOL/HDL RATIO: 4.5 ratio (ref 0.0–5.0)
Cholesterol, Total: 131 mg/dL (ref 100–199)
HDL: 29 mg/dL — ABNORMAL LOW (ref >39)
LDL CALC: 62 mg/dL (ref 0–99)
TRIGLYCERIDES: 201 mg/dL — AB (ref 0–149)
VLDL Cholesterol Cal: 40 mg/dL (ref 5–40)

## 2018-02-16 LAB — HEMOGLOBIN A1C
Est. average glucose Bld gHb Est-mCnc: 97 mg/dL
HEMOGLOBIN A1C: 5 % (ref 4.8–5.6)

## 2018-02-17 ENCOUNTER — Other Ambulatory Visit: Payer: Self-pay | Admitting: Urgent Care

## 2018-02-17 MED ORDER — ATORVASTATIN CALCIUM 20 MG PO TABS: 20.0000 mg | ORAL_TABLET | Freq: Every day | ORAL | 3 refills | Status: DC

## 2018-08-01 ENCOUNTER — Ambulatory Visit: Payer: 59 | Admitting: Family Medicine

## 2018-10-11 ENCOUNTER — Other Ambulatory Visit: Payer: Self-pay | Admitting: Urgent Care

## 2018-10-11 DIAGNOSIS — I10 Essential (primary) hypertension: Secondary | ICD-10-CM

## 2018-10-31 ENCOUNTER — Ambulatory Visit (INDEPENDENT_AMBULATORY_CARE_PROVIDER_SITE_OTHER): Payer: 59 | Admitting: Physician Assistant

## 2018-10-31 ENCOUNTER — Encounter: Payer: Self-pay | Admitting: Physician Assistant

## 2018-10-31 VITALS — BP 188/110 | HR 67 | Temp 98.9°F | Resp 17 | Ht 74.0 in | Wt 369.0 lb

## 2018-10-31 DIAGNOSIS — R030 Elevated blood-pressure reading, without diagnosis of hypertension: Secondary | ICD-10-CM

## 2018-10-31 DIAGNOSIS — M109 Gout, unspecified: Secondary | ICD-10-CM | POA: Diagnosis not present

## 2018-10-31 DIAGNOSIS — I10 Essential (primary) hypertension: Secondary | ICD-10-CM

## 2018-10-31 MED ORDER — PREDNISONE 10 MG PO TABS: ORAL_TABLET | ORAL | 0 refills | Status: DC

## 2018-10-31 MED ORDER — CARVEDILOL 25 MG PO TABS: 25.0000 mg | ORAL_TABLET | Freq: Two times a day (BID) | ORAL | 3 refills | Status: DC

## 2018-10-31 MED ORDER — AMLODIPINE BESYLATE 10 MG PO TABS: 10.0000 mg | ORAL_TABLET | Freq: Every day | ORAL | 3 refills | Status: DC

## 2018-10-31 NOTE — Progress Notes (Signed)
Calvin Bailey  MRN: [id number] DOB: Mar 04, 1981  PCP: Shawnee Knapp, MD  Subjective:  Pt is a 38 year old male who presents to clinic for possible gout. Pain of right great toe x 2 days.   Endorses pain of joint of right great toe. This is not his first flare. Last gout flare was in Nov 2019. He has not taken anything to feel better.   Elevated blood pressure reading. Today's blood pressure is 188/110. He is taking Norvas 5 mg, carvedilol 12.5mg  bid, and irbesartan 300 mg as directed. Denies medication SE. PCP is Dr. Brigitte Pulse. Pt states he has not been seen for blood pressure in about 9 months. Denies lightheadedness, dizziness, chronic headache, double vision, chest pain, shortness of breath, heart racing, palpitations, nausea, vomiting, abdominal pain, hematuria, lower leg swelling.   Pt  has a past medical history of GERD (gastroesophageal reflux disease), HTN (hypertension), Obesity hypoventilation syndrome ([place]) (04/08/2014), OSA (obstructive sleep apnea) (04/08/2014), and Sleep apnea.  Review of Systems  Constitutional: Negative for diaphoresis and fatigue.  Cardiovascular: Negative for chest pain, palpitations and leg swelling.  Musculoskeletal: Positive for arthralgias and gait problem.  Skin: Positive for color change.  Neurological: Negative for dizziness and headaches.    Patient Active Problem List   Diagnosis Date Noted  . Chronic cough 06/11/2017  . Abnormal CT of the chest 06/11/2017  . Hypogonadism male 07/12/2014  . Erectile dysfunction due to arterial insufficiency 07/12/2014  . Obesity hypoventilation syndrome ([place]) 04/08/2014  . Morbid obesity due to excess calories ([employer]) 04/08/2014  . OSA (obstructive sleep apnea) 04/08/2014  . Cigarette smoker 02/23/2011  . GERD (gastroesophageal reflux disease) 02/09/2011  . Benign essential HTN 07/21/2009    Current Outpatient Medications on File Prior to Visit  Medication Sig Dispense Refill  . amLODipine (NORVASC) 5 MG  tablet Take 1 tablet (5 mg total) by mouth daily. 90 tablet 3  . atorvastatin (LIPITOR) 20 MG tablet Take 1 tablet (20 mg total) by mouth daily. 90 tablet 3  . carvedilol (COREG) 12.5 MG tablet TAKE 1 TABLET(12.5 MG) BY MOUTH TWICE DAILY WITH A MEAL 60 tablet 0  . Dextromethorphan-Guaifenesin (MUCINEX DM MAXIMUM STRENGTH) 60-1200 MG TB12 Take 1 tablet by mouth every 12 (twelve) hours. 28 each 1  . furosemide (LASIX) 20 MG tablet Take 1 tablet (20 mg total) by mouth daily. 30 tablet 5  . irbesartan (AVAPRO) 300 MG tablet Take 1 tablet (300 mg total) by mouth daily. 90 tablet 1  . Testosterone (ANDROGEL) 20.25 MG/1.25GM (1.62%) GEL Two pumps on each shoulder daily 5 g 1  . valACYclovir (VALTREX) 1000 MG tablet Take 1,000 mg by mouth daily.    . [DISCONTINUED] LISINOPRIL-HYDROCHLOROTHIAZIDE PO Take by mouth.     No current facility-administered medications on file prior to visit.     Allergies  Allergen Reactions  . Chlorthalidone Nausea Only     Objective:  BP (!) 212/130   Pulse 67   Temp 98.9 F (37.2 C) (Oral)   Resp 17   Ht 6\' 2"  (1.88 m)   Wt (!) 369 lb (167.4 kg)   SpO2 98%   BMI 47.38 kg/m   Physical Exam Vitals signs reviewed.  Constitutional:      Appearance: Normal appearance.  Neurological:     Mental Status: He is alert.     Assessment and Plan :  1. Essential hypertension 2. Elevated blood pressure reading - Pt here for gout flare, however blood pressure is very  high today. He is asymptomatic. Plan to increase Norvasc and carvedilol today. Check home blood pressures. RTC in 2 weeks for recheck. Encouraged DASH diet and walking regularly.  - Recheck vitals - amLODipine (NORVASC) 10 MG tablet; Take 1 tablet (10 mg total) by mouth daily.  Dispense: 90 tablet; Refill: 3 - carvedilol (COREG) 25 MG tablet; Take 1 tablet (25 mg total) by mouth 2 (two) times daily with a meal.  Dispense: 90 tablet; Refill: 3  3. Gout of right foot, unspecified cause, unspecified  chronicity - predniSONE (DELTASONE) 10 MG tablet; Take 40mg  x 3 days, then 30mg  x 3 days, then 20mg  x 3 days, then 10 mg x 3 days.  Dispense: 20 tablet; Refill: 0   Whitney Morley Gaumer, PA-C  [facility] 10/31/2018 2:16 PM  Please note: Portions of this report may have been transcribed using dragon voice recognition software. Every effort was made to ensure accuracy; however, inadvertent computerized transcription errors may be present.

## 2018-10-31 NOTE — Patient Instructions (Addendum)
Increase your dose of Norvasc to 10mg /day and carvedilol to 25 mg twice daily. Come back in 2 weeks to recheck your blood pressure and how you are feeling.  Start walking for 20 minutes 3-4 times a week.  Read below and pick 1-2 things to work on to improve your diet.   For gout: start taking prednisone for your gout flare. Take this as directed. Take the ENTIRE COURSE.    DASH Eating Plan DASH stands for "Dietary Approaches to Stop Hypertension." The DASH eating plan is a healthy eating plan that has been shown to reduce high blood pressure (hypertension). It may also reduce your risk for type 2 diabetes, heart disease, and stroke. The DASH eating plan may also help with weight loss. What are tips for following this plan?  General guidelines  Avoid eating more than 2,300 mg (milligrams) of salt (sodium) a day. If you have hypertension, you may need to reduce your sodium intake to 1,500 mg a day.  Limit alcohol intake to no more than 1 drink a day for nonpregnant women and 2 drinks a day for men. One drink equals 12 oz of beer, 5 oz of wine, or 1 oz of hard liquor.  Work with your health care provider to maintain a healthy body weight or to lose weight. Ask what an ideal weight is for you.  Get at least 30 minutes of exercise that causes your heart to beat faster (aerobic exercise) most days of the week. Activities may include walking, swimming, or biking.  Work with your health care provider or diet and nutrition specialist (dietitian) to adjust your eating plan to your individual calorie needs. Reading food labels   Check food labels for the amount of sodium per serving. Choose foods with less than 5 percent of the Daily Value of sodium. Generally, foods with less than 300 mg of sodium per serving fit into this eating plan.  To find whole grains, look for the word "whole" as the first word in the ingredient list. Shopping  Buy products labeled as "low-sodium" or "no salt  added."  Buy fresh foods. Avoid canned foods and premade or frozen meals. Cooking  Avoid adding salt when cooking. Use salt-free seasonings or herbs instead of table salt or sea salt. Check with your health care provider or pharmacist before using salt substitutes.  Do not fry foods. Cook foods using healthy methods such as baking, boiling, grilling, and broiling instead.  Cook with heart-healthy oils, such as olive, canola, soybean, or sunflower oil. Meal planning  Eat a balanced diet that includes: ? 5 or more servings of fruits and vegetables each day. At each meal, try to fill half of your plate with fruits and vegetables. ? Up to 6-8 servings of whole grains each day. ? Less than 6 oz of lean meat, poultry, or fish each day. A 3-oz serving of meat is about the same size as a deck of cards. One egg equals 1 oz. ? 2 servings of low-fat dairy each day. ? A serving of nuts, seeds, or beans 5 times each week. ? Heart-healthy fats. Healthy fats called Omega-3 fatty acids are found in foods such as flaxseeds and coldwater fish, like sardines, salmon, and mackerel.  Limit how much you eat of the following: ? Canned or prepackaged foods. ? Food that is high in trans fat, such as fried foods. ? Food that is high in saturated fat, such as fatty meat. ? Sweets, desserts, sugary drinks, and other foods  with added sugar. ? Full-fat dairy products.  Do not salt foods before eating.  Try to eat at least 2 vegetarian meals each week.  Eat more home-cooked food and less restaurant, buffet, and fast food.  When eating at a restaurant, ask that your food be prepared with less salt or no salt, if possible. What foods are recommended? The items listed may not be a complete list. Talk with your dietitian about what dietary choices are best for you. Grains Whole-grain or whole-wheat bread. Whole-grain or whole-wheat pasta. Brown rice. [place]row. Bulgur. Whole-grain and low-sodium cereals.  Pita bread. Low-fat, low-sodium crackers. Whole-wheat flour tortillas. Vegetables Fresh or frozen vegetables (raw, steamed, roasted, or grilled). Low-sodium or reduced-sodium tomato and vegetable juice. Low-sodium or reduced-sodium tomato sauce and tomato paste. Low-sodium or reduced-sodium canned vegetables. Fruits All fresh, dried, or frozen fruit. Canned fruit in natural juice (without added sugar). Meat and other protein foods Skinless chicken or [place]. Ground chicken or [place]. Pork with fat trimmed off. Fish and seafood. Egg whites. Dried beans, peas, or lentils. Unsalted nuts, nut butters, and seeds. Unsalted canned beans. Lean cuts of beef with fat trimmed off. Low-sodium, lean deli meat. Dairy Low-fat (1%) or fat-free (skim) milk. Fat-free, low-fat, or reduced-fat cheeses. Nonfat, low-sodium ricotta or cottage cheese. Low-fat or nonfat yogurt. Low-fat, low-sodium cheese. Fats and oils Soft margarine without trans fats. Vegetable oil. Low-fat, reduced-fat, or light mayonnaise and salad dressings (reduced-sodium). Canola, safflower, olive, soybean, and sunflower oils. Avocado. Seasoning and other foods Herbs. Spices. Seasoning mixes without salt. Unsalted popcorn and pretzels. Fat-free sweets. What foods are not recommended? The items listed may not be a complete list. Talk with your dietitian about what dietary choices are best for you. Grains Baked goods made with fat, such as croissants, muffins, or some breads. Dry pasta or rice meal packs. Vegetables Creamed or fried vegetables. Vegetables in a cheese sauce. Regular canned vegetables (not low-sodium or reduced-sodium). Regular canned tomato sauce and paste (not low-sodium or reduced-sodium). Regular tomato and vegetable juice (not low-sodium or reduced-sodium). Angie Fava. Olives. Fruits Canned fruit in a light or heavy syrup. Fried fruit. Fruit in cream or butter sauce. Meat and other protein foods Fatty cuts of meat. Ribs. Fried  meat. Berniece Salines. Sausage. Bologna and other processed lunch meats. Salami. Fatback. Hotdogs. Bratwurst. Salted nuts and seeds. Canned beans with added salt. Canned or smoked fish. Whole eggs or egg yolks. Chicken or [place] with skin. Dairy Whole or 2% milk, cream, and half-and-half. Whole or full-fat cream cheese. Whole-fat or sweetened yogurt. Full-fat cheese. Nondairy creamers. Whipped toppings. Processed cheese and cheese spreads. Fats and oils Butter. Stick margarine. Lard. Shortening. Ghee. Bacon fat. Tropical oils, such as coconut, palm kernel, or palm oil. Seasoning and other foods Salted popcorn and pretzels. Onion salt, garlic salt, seasoned salt, table salt, and sea salt. Worcestershire sauce. Tartar sauce. Barbecue sauce. Teriyaki sauce. Soy sauce, including reduced-sodium. Steak sauce. Canned and packaged gravies. Fish sauce. Oyster sauce. Cocktail sauce. Horseradish that you find on the shelf. Ketchup. Mustard. Meat flavorings and tenderizers. Bouillon cubes. Hot sauce and Tabasco sauce. Premade or packaged marinades. Premade or packaged taco seasonings. Relishes. Regular salad dressings. Where to find more information:  National Heart, Lung, and [place]: https://wilson-eaton.com/  American Heart Association: www.heart.org Summary  The DASH eating plan is a healthy eating plan that has been shown to reduce high blood pressure (hypertension). It may also reduce your risk for type 2 diabetes, heart disease, and stroke.  With the  DASH eating plan, you should limit salt (sodium) intake to 2,300 mg a day. If you have hypertension, you may need to reduce your sodium intake to 1,500 mg a day.  When on the DASH eating plan, aim to eat more fresh fruits and vegetables, whole grains, lean proteins, low-fat dairy, and heart-healthy fats.  Work with your health care provider or diet and nutrition specialist (dietitian) to adjust your eating plan to your individual calorie needs. This information is  not intended to replace advice given to you by your health care provider. Make sure you discuss any questions you have with your health care provider. Document Released: 09/02/2011 Document Revised: 09/06/2016 Document Reviewed: 09/06/2016 Elsevier Interactive Patient Education  2019 [employer].

## 2018-11-16 ENCOUNTER — Ambulatory Visit: Payer: 59 | Admitting: Family Medicine

## 2018-11-25 ENCOUNTER — Other Ambulatory Visit: Payer: Self-pay | Admitting: Family Medicine

## 2018-11-25 DIAGNOSIS — I10 Essential (primary) hypertension: Secondary | ICD-10-CM

## 2019-01-04 ENCOUNTER — Other Ambulatory Visit: Payer: Self-pay

## 2019-01-04 ENCOUNTER — Telehealth (INDEPENDENT_AMBULATORY_CARE_PROVIDER_SITE_OTHER): Payer: 59 | Admitting: Family Medicine

## 2019-01-04 DIAGNOSIS — B354 Tinea corporis: Secondary | ICD-10-CM | POA: Insufficient documentation

## 2019-01-04 DIAGNOSIS — B360 Pityriasis versicolor: Secondary | ICD-10-CM | POA: Diagnosis not present

## 2019-01-04 MED ORDER — SELENIUM SULFIDE 2.25 % EX SHAM: 1.0000 | MEDICATED_SHAMPOO | Freq: Every day | CUTANEOUS | 0 refills | Status: DC

## 2019-01-04 MED ORDER — TERBINAFINE HCL 1 % EX CREA: 1.0000 "application " | TOPICAL_CREAM | Freq: Two times a day (BID) | CUTANEOUS | 0 refills | Status: DC

## 2019-01-04 NOTE — Progress Notes (Signed)
Telemedicine Encounter  I discussed the limitations, risks, security and privacy concerns of performing an evaluation and management service by video-I was able to see pt by video, pt unable to see me. We were able to hear each other.  I also discussed with the patient that there may be a patient responsible charge related to this service. The patient expressed understanding and agreed to proceed.  This telephone encounter was conducted with the patient's  verbal consent via audio telecommunications:  Patient was instructed to have this encounter in a suitably private space; and to only have persons present to whom they give permission to participate.  I spent a total of 22min talking with the patient-confirmed with photo . Pt with rash on his feet and legs over the last few months-used otc fungal cream with less itch but no resolution. Pt now concerned with rash on his shoulders and face  Calvin Bailey is a 38 y.o. male established patient. Telephone visit today for rash  HPI pt states rash noted on the legs and feet over the last 2 months. Pt states the itch improved after using over the counter fungal cream but rash continued. Pt states now rash on the shoulders and right side of face. Pt states skin lighter in some places and darker in some places. Pt states he works outside for city of [employer] and is exposed to heat and water on the job. Pt with concern rash is spreading-face onset yesterday, shoulder a few days ago after rash on lower legs and feet for 2 months.   Patient Active Problem List   Diagnosis Date Noted  . Chronic cough 06/11/2017  . Abnormal CT of the chest 06/11/2017  . Hypogonadism male 07/12/2014  . Erectile dysfunction due to arterial insufficiency 07/12/2014  . Obesity hypoventilation syndrome ([place]) 04/08/2014  . Morbid obesity due to excess calories ([place]) 04/08/2014  . OSA (obstructive sleep apnea) 04/08/2014  . Cigarette smoker 02/23/2011  . GERD  (gastroesophageal reflux disease) 02/09/2011  . Benign essential HTN 07/21/2009    Past Medical History:  Diagnosis Date  . GERD (gastroesophageal reflux disease)   . HTN (hypertension)   . Obesity hypoventilation syndrome ([place]) 04/08/2014  . OSA (obstructive sleep apnea) 04/08/2014  . Sleep apnea     Current Outpatient Medications  Medication Sig Dispense Refill  . amLODipine (NORVASC) 10 MG tablet Take 1 tablet (10 mg total) by mouth daily. 90 tablet 3  . atorvastatin (LIPITOR) 20 MG tablet Take 1 tablet (20 mg total) by mouth daily. 90 tablet 3  . carvedilol (COREG) 25 MG tablet Take 1 tablet (25 mg total) by mouth 2 (two) times daily with a meal. 90 tablet 3  . furosemide (LASIX) 20 MG tablet Take 1 tablet (20 mg total) by mouth daily. 30 tablet 5  . valACYclovir (VALTREX) 1000 MG tablet Take 1,000 mg by mouth daily.     No current facility-administered medications for this visit.     Allergies  Allergen Reactions  . Chlorthalidone Nausea Only  . Lisinopril Cough    Social History   Socioeconomic History  . Marital status: Single    Spouse name: Not on file  . Number of children: 1  . Years of education: college  . Highest education level: Not on file  Occupational History  . Occupation: unemployment    Fish farm manager: UNEMPLOYED    Employer: [employer]  Social Needs  . Financial resource strain: Not on file  . Food insecurity:  Worry: Not on file    Inability: Not on file  . Transportation needs:    Medical: Not on file    Non-medical: Not on file  Tobacco Use  . Smoking status: Current Every Day Smoker    Packs/day: 1.00    Years: 16.00    Pack years: 16.00    Types: Cigarettes  . Smokeless tobacco: Never Used  Substance and Sexual Activity  . Alcohol use: Yes    Alcohol/week: 0.0 standard drinks    Comment: sparingly   . Drug use: No  . Sexual activity: Yes    Partners: Female  Lifestyle  . Physical activity:    Days per week: Not on file     Minutes per session: Not on file  . Stress: Not on file  Relationships  . Social connections:    Talks on phone: Not on file    Gets together: Not on file    Attends religious service: Not on file    Active member of club or organization: Not on file    Attends meetings of clubs or organizations: Not on file    Relationship status: Not on file  . Intimate partner violence:    Fear of current or ex partner: Not on file    Emotionally abused: Not on file    Physically abused: Not on file    Forced sexual activity: Not on file  Other Topics Concern  . Not on file  Social History Narrative   Works for city of [employer]   Patient is single and lives alone.   Patient has one child.   Patient works at [employer].   Patient has a college education.   Patient is left-handed.   Patient does not drink any caffeine.    ROS  Skin-circular rash on the right side of face-no papules, no vesicles, no itch, no pain Hypo/hyperpigmented rash on the right shoulder-no papules, no vesicles, no itch, no pain Itchy rash on feet and legs-no visual currently after medications used, no itch after topical meds  Objective  No vitals-pt seen by video chat Skin-circular hypopigmented macule right cheek-no papules, no vesicles, hypopigmented macular rash right shoulder-unable to visualize legs/feet  Diagnoses and all orders for this visit:  Tinea versicolor- Tinea corporis  Other orders -     Selenium Sulfide 2.25 % SHAM; Apply 1 Dose topically daily. Apply from neck to toes topically daily-leave on skin for 6minutes, rinse thoroughly, repeat daily for 7 days -     terbinafine (LAMISIL AT) 1 % cream; Apply 1 application topically 2 (two) times daily. Apply to affected are on the face   I discussed the assessment and treatment plan with the patient. The patient was provided an opportunity to ask questions and all were answered. The patient agreed with the plan and  demonstrated an understanding of the instructions.   The patient was advised to call back or seek an in-person evaluation if the symptoms worsen or if the condition fails to improve as anticipated. Suggested follow up visit in 2 weeks face to face if rash unresolved  I provided 10 minutes of non-face-to-face time during this encounter.  LISA Hannah Beat, MD  Primary Care at [facility]

## 2019-01-04 NOTE — Progress Notes (Signed)
Rash on the skin for over a month and a half thought it was ringworm.   Has not gone away. Located foot, leg and shoulder on right side of the body.

## 2019-05-18 ENCOUNTER — Telehealth: Payer: Self-pay | Admitting: Registered Nurse

## 2019-05-18 NOTE — Telephone Encounter (Signed)
Pt is wanting a curtesy refill on his furosemide (LASIX) 20 MG tablet [id number]  Until his upcoming app with Orland Mustard on 05/30/19. Please advise at [phone]

## 2019-05-22 ENCOUNTER — Other Ambulatory Visit: Payer: Self-pay

## 2019-05-22 MED ORDER — FUROSEMIDE 20 MG PO TABS: 20.0000 mg | ORAL_TABLET | Freq: Every day | ORAL | 0 refills | Status: DC

## 2019-05-22 NOTE — Telephone Encounter (Signed)
Rx sent to pharmacy   

## 2019-05-24 ENCOUNTER — Other Ambulatory Visit: Payer: Self-pay | Admitting: Internal Medicine

## 2019-05-30 ENCOUNTER — Encounter: Payer: Self-pay | Admitting: Registered Nurse

## 2019-05-30 ENCOUNTER — Ambulatory Visit: Payer: 59 | Admitting: Registered Nurse

## 2019-05-30 ENCOUNTER — Other Ambulatory Visit: Payer: Self-pay

## 2019-05-30 ENCOUNTER — Ambulatory Visit (INDEPENDENT_AMBULATORY_CARE_PROVIDER_SITE_OTHER): Payer: 59

## 2019-05-30 VITALS — BP 190/90 | HR 70 | Temp 99.0°F | Resp 16 | Ht 74.02 in | Wt 376.0 lb

## 2019-05-30 DIAGNOSIS — R0609 Other forms of dyspnea: Secondary | ICD-10-CM

## 2019-05-30 DIAGNOSIS — I10 Essential (primary) hypertension: Secondary | ICD-10-CM

## 2019-05-30 DIAGNOSIS — R0600 Dyspnea, unspecified: Secondary | ICD-10-CM | POA: Insufficient documentation

## 2019-05-30 DIAGNOSIS — R609 Edema, unspecified: Secondary | ICD-10-CM | POA: Diagnosis not present

## 2019-05-30 DIAGNOSIS — R0602 Shortness of breath: Secondary | ICD-10-CM | POA: Insufficient documentation

## 2019-05-30 IMAGING — DX DG CHEST 2V
2 series · 2 of 2 positions shown · non-contrast
Comparison: [DATE]

CLINICAL DATA: Shortness of breath

EXAM:
CHEST - 2 VIEW

[chest pa]
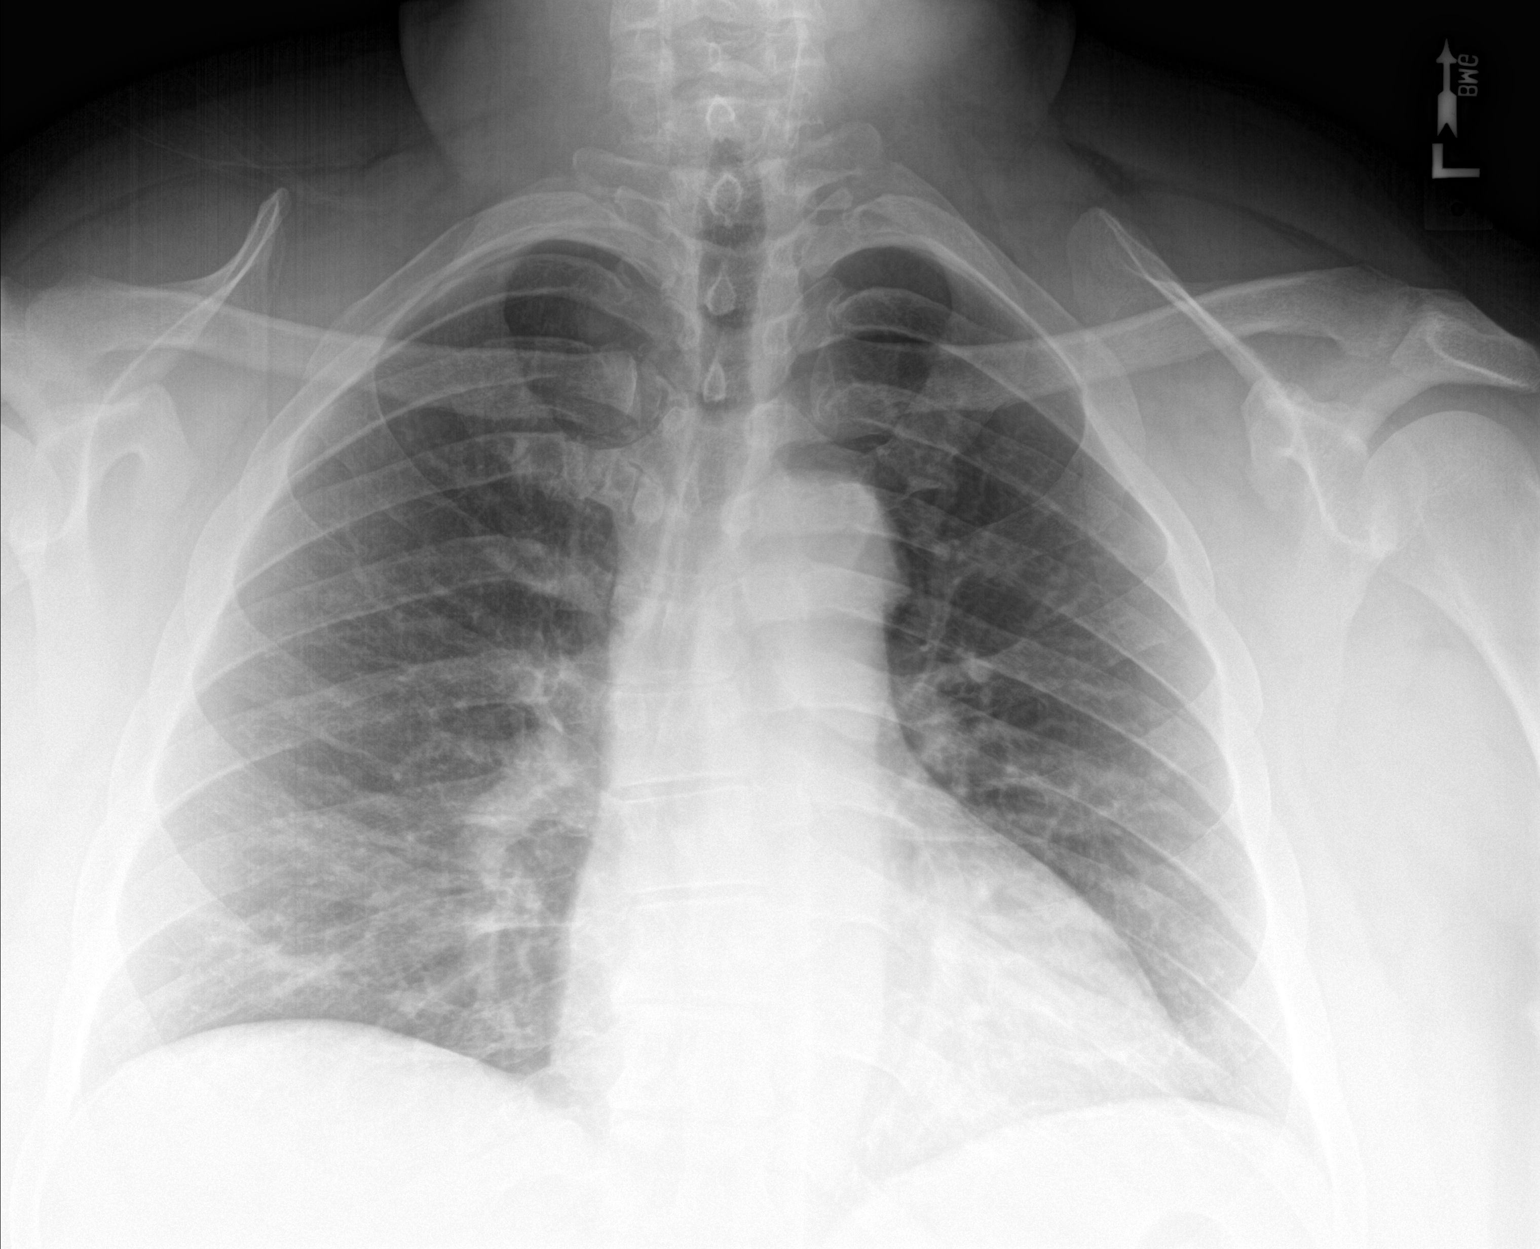

[chest lat]
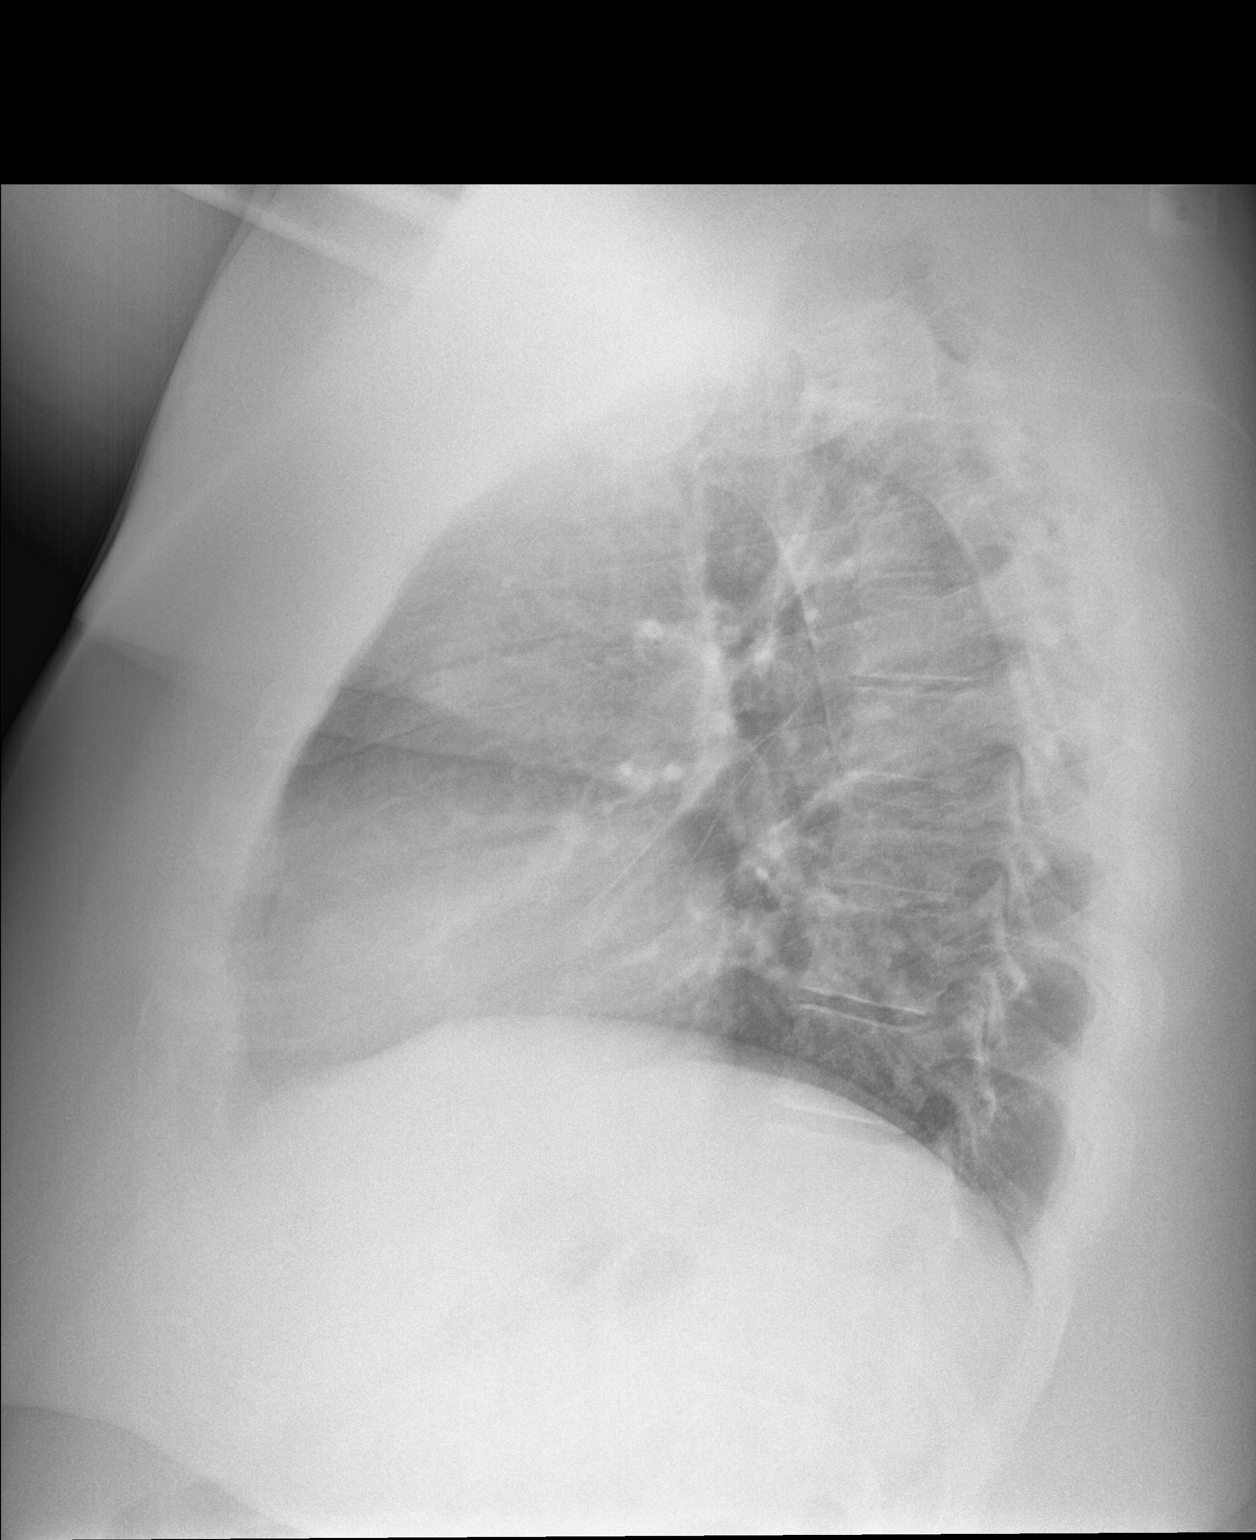

[2 of 2 positions shown; findings below may reference images not displayed]

FINDINGS: The heart size and mediastinal contours are within normal limits.
Minimal linear opacity within the right lung base, favor
atelectasis. No pleural effusion or pneumothorax. The visualized
skeletal structures are unremarkable.
IMPRESSION: Minimal linear opacity within the right lung base, favor
atelectasis.

## 2019-05-30 MED ORDER — AMLODIPINE BESYLATE 10 MG PO TABS: 10.0000 mg | ORAL_TABLET | Freq: Every day | ORAL | 0 refills | Status: DC

## 2019-05-30 MED ORDER — ATORVASTATIN CALCIUM 20 MG PO TABS: 20.0000 mg | ORAL_TABLET | Freq: Every day | ORAL | 0 refills | Status: DC

## 2019-05-30 MED ORDER — CARVEDILOL 25 MG PO TABS: 25.0000 mg | ORAL_TABLET | Freq: Two times a day (BID) | ORAL | 0 refills | Status: DC

## 2019-05-30 MED ORDER — FUROSEMIDE 40 MG PO TABS: 40.0000 mg | ORAL_TABLET | Freq: Every day | ORAL | 0 refills | Status: DC

## 2019-05-30 NOTE — Progress Notes (Signed)
Established Patient Office Visit  Subjective:  Patient ID: Calvin Bailey, male    DOB: [birth year]  Age: 38 y.o. MRN: [id number]  CC:  Chief Complaint  Patient presents with  . Establish Care    need new pcp to manage medications and Chronic Conditions/ will need refills on medication   . Leg Swelling    left leg swelling     HPI NEEDHAM MACGOWAN presents for transfer of care and elevated BP  He is taking amlodipine 10mg  PO qd, Carvedilol 25mg  PO bid, and furosemide 20mg  PO qd.  He is having new DOE and dependent edema. This is concerning for him and prompted this visit. He had been seen by a cardiologist around 2-3 years prior but has not followed up since.  He states that after short spans of activity, he feels his heart racing, has trouble catching his breath, and something feels "not right". His BP is extremely elevated in office today, though he denies headache, chest pain, epistaxis, GI symptoms, shob at rest.   He does have bilateral pitting edema of lower extremities.   Past Medical History:  Diagnosis Date  . GERD (gastroesophageal reflux disease)   . HTN (hypertension)   . Obesity hypoventilation syndrome ([place]) 04/08/2014  . OSA (obstructive sleep apnea) 04/08/2014  . Sleep apnea     Past Surgical History:  Procedure Laterality Date  . two knee surgeries Left 1995    Family History  Problem Relation Age of Onset  . Hypertension Mother   . Multiple sclerosis Mother   . Hypertension Father   . Hypertension Brother   . Hyperlipidemia Paternal Grandfather   . Heart failure Paternal Grandfather   . Diabetes Maternal Grandmother   . Heart failure Maternal Grandfather     Social History   Socioeconomic History  . Marital status: Single    Spouse name: Not on file  . Number of children: 1  . Years of education: college  . Highest education level: Not on file  Occupational History  . Occupation: unemployment    Fish farm manager: UNEMPLOYED    Employer: [employer]  Social Needs  . Financial resource strain: Not on file  . Food insecurity    Worry: Not on file    Inability: Not on file  . Transportation needs    Medical: Not on file    Non-medical: Not on file  Tobacco Use  . Smoking status: Current Every Day Smoker    Packs/day: 1.00    Years: 16.00    Pack years: 16.00    Types: Cigarettes  . Smokeless tobacco: Never Used  Substance and Sexual Activity  . Alcohol use: Yes    Alcohol/week: 0.0 standard drinks    Comment: sparingly   . Drug use: No  . Sexual activity: Yes    Partners: Female  Lifestyle  . Physical activity    Days per week: Not on file    Minutes per session: Not on file  . Stress: Not on file  Relationships  . Social Herbalist on phone: Not on file    Gets together: Not on file    Attends religious service: Not on file    Active member of club or organization: Not on file    Attends meetings of clubs or organizations: Not on file    Relationship status: Not on file  . Intimate partner violence    Fear of current or ex partner: Not on file  Emotionally abused: Not on file    Physically abused: Not on file    Forced sexual activity: Not on file  Other Topics Concern  . 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   Patient has one child.   Patient works at [employer].   Patient has a college education.   Patient is left-handed.   Patient does not drink any caffeine.    Outpatient Medications Prior to Visit  Medication Sig Dispense Refill  . amLODipine (NORVASC) 10 MG tablet Take 1 tablet (10 mg total) by mouth daily. 90 tablet 3  . carvedilol (COREG) 25 MG tablet Take 1 tablet (25 mg total) by mouth 2 (two) times daily with a meal. 90 tablet 3  . furosemide (LASIX) 20 MG tablet Take 1 tablet (20 mg total) by mouth daily. 30 tablet 0  . Selenium Sulfide 2.25 % SHAM Apply 1 Dose topically daily. Apply from neck to toes  topically daily-leave on skin for 19minutes, rinse thoroughly, repeat daily for 7 days 1 Bottle 0  . terbinafine (LAMISIL AT) 1 % cream Apply 1 application topically 2 (two) times daily. Apply to affected are on the face 30 g 0  . valACYclovir (VALTREX) 1000 MG tablet Take 1,000 mg by mouth daily.    Marland Kitchen atorvastatin (LIPITOR) 20 MG tablet Take 1 tablet (20 mg total) by mouth daily. (Patient not taking: Reported on 05/30/2019) 90 tablet 3   No facility-administered medications prior to visit.     Allergies  Allergen Reactions  . Chlorthalidone Nausea Only  . Lisinopril Cough    ROS Review of Systems  Constitutional: Negative.   HENT: Negative.   Eyes: Negative.   Respiratory: Negative.   Cardiovascular: Negative.   Gastrointestinal: Negative.   Endocrine: Negative.   Genitourinary: Negative.   Musculoskeletal: Negative.   Skin: Negative.   Allergic/Immunologic: Negative.   Neurological: Negative.   Hematological: Negative.   Psychiatric/Behavioral: Negative.   All other systems reviewed and are negative.     Objective:    Physical Exam  Constitutional: He is oriented to person, place, and time. He appears well-developed and well-nourished. No distress.  HENT:  Head: Normocephalic and atraumatic.  Cardiovascular: Normal rate, regular rhythm and normal heart sounds. Exam reveals no gallop and no friction rub.  No murmur heard. Pulmonary/Chest: Effort normal and breath sounds normal. No respiratory distress.  Abdominal: Soft. Bowel sounds are normal.  Musculoskeletal: Normal range of motion.        General: Edema (BLE +3 pitting edema) present. No tenderness or deformity.  Neurological: He is alert and oriented to person, place, and time. No cranial nerve deficit.  Skin: Skin is warm and dry. No rash noted. He is not diaphoretic. No erythema. No pallor.  Psychiatric: He has a normal mood and affect. His behavior is normal. Judgment and thought content normal.  Vitals  reviewed.   BP (!) 190/90   Pulse 70   Temp 99 F (37.2 C) (Oral)   Resp 16   Ht 6' 2.02" (1.88 m)   Wt (!) 376 lb (170.6 kg)   SpO2 98%   BMI 48.25 kg/m  Wt Readings from Last 3 Encounters:  05/30/19 (!) 376 lb (170.6 kg)  10/31/18 (!) 369 lb (167.4 kg)  02/15/18 (!) 369 lb (167.4 kg)     There are no preventive care reminders to display for this patient.  There are no preventive care reminders  to display for this patient.  Lab Results  Component Value Date   TSH 0.40 04/29/2016   Lab Results  Component Value Date   WBC 15.9 (H) 07/02/2017   HGB 15.4 07/02/2017   HCT 44.6 07/02/2017   MCV 86.1 07/02/2017   PLT 252 07/02/2017   Lab Results  Component Value Date   NA 142 02/15/2018   K 4.0 02/15/2018   CO2 23 02/15/2018   GLUCOSE 98 02/15/2018   BUN 23 (H) 02/15/2018   CREATININE 1.77 (H) 02/15/2018   BILITOT 0.4 02/15/2018   ALKPHOS 88 02/15/2018   AST 14 02/15/2018   ALT 19 02/15/2018   PROT 7.1 02/15/2018   ALBUMIN 4.0 02/15/2018   CALCIUM 9.2 02/15/2018   ANIONGAP 9 07/02/2017   Lab Results  Component Value Date   CHOL 131 02/15/2018   Lab Results  Component Value Date   HDL 29 (L) 02/15/2018   Lab Results  Component Value Date   LDLCALC 62 02/15/2018   Lab Results  Component Value Date   TRIG 201 (H) 02/15/2018   Lab Results  Component Value Date   CHOLHDL 4.5 02/15/2018   Lab Results  Component Value Date   HGBA1C 5.0 02/15/2018      Assessment & Plan:   Problem List Items Addressed This Visit    None    Visit Diagnoses    Essential hypertension    -  Primary   Relevant Orders   EKG 12-Lead (Completed)   CBC   Comprehensive metabolic panel   Hemoglobin A1c   Lipid panel   TSH   Brain natriuretic peptide   Ambulatory referral to Cardiology   3+ pitting edema       Relevant Orders   Lipid panel   Brain natriuretic peptide   Ambulatory referral to Cardiology   Dyspnea on exertion       Relevant Orders   Ambulatory  referral to Cardiology   DG Chest 2 View (Completed)      No orders of the defined types were placed in this encounter.   Follow-up: No follow-ups on file.   PLAN  Double furosemide to 40mg  PO qd.  EKG on site: no acute findings, but multiple findings suggestive of ischemic changes in his heart and LA enlargement. Chest xray on site shows on cardiac findings. However, given his history, urgent referral to cardiology warranted for further workup.  Will see him again in around 2 weeks for further follow up and BP control  Patient encouraged to call clinic with any questions, comments, or concerns.   Maximiano Coss, NP

## 2019-05-30 NOTE — Patient Instructions (Signed)
° ° ° °  If you have lab work done today you will be contacted with your lab results within the next 2 weeks.  If you have not heard from us then please contact us. The fastest way to get your results is to register for My Chart. ° ° °IF you received an x-ray today, you will receive an invoice from [facility]. Please contact [facility] at [phone] with questions or concerns regarding your invoice.  ° °IF you received labwork today, you will receive an invoice from [facility]. Please contact [facility] at [phone] with questions or concerns regarding your invoice.  ° °Our billing staff will not be able to assist you with questions regarding bills from these companies. ° °You will be contacted with the lab results as soon as they are available. The fastest way to get your results is to activate your My Chart account. Instructions are located on the last page of this paperwork. If you have not heard from us regarding the results in 2 weeks, please contact this office. °  ° ° ° °

## 2019-05-31 LAB — TSH: TSH: 1.21 u[IU]/mL (ref 0.450–4.500)

## 2019-05-31 LAB — CBC
Hematocrit: 47.5 % (ref 37.5–51.0)
Hemoglobin: 15.9 g/dL (ref 13.0–17.7)
MCH: 29.3 pg (ref 26.6–33.0)
MCHC: 33.5 g/dL (ref 31.5–35.7)
MCV: 88 fL (ref 79–97)
Platelets: 230 10*3/uL (ref 150–450)
RBC: 5.43 x10E6/uL (ref 4.14–5.80)
RDW: 13.2 % (ref 11.6–15.4)
WBC: 7.2 10*3/uL (ref 3.4–10.8)

## 2019-05-31 LAB — COMPREHENSIVE METABOLIC PANEL
ALT: 29 IU/L (ref 0–44)
AST: 17 IU/L (ref 0–40)
Albumin/Globulin Ratio: 1.5 (ref 1.2–2.2)
Albumin: 4.7 g/dL (ref 4.0–5.0)
Alkaline Phosphatase: 100 IU/L (ref 39–117)
BUN/Creatinine Ratio: 13 (ref 9–20)
BUN: 20 mg/dL (ref 6–20)
Bilirubin Total: 0.4 mg/dL (ref 0.0–1.2)
CO2: 21 mmol/L (ref 20–29)
Calcium: 9.1 mg/dL (ref 8.7–10.2)
Chloride: 103 mmol/L (ref 96–106)
Creatinine, Ser: 1.56 mg/dL — ABNORMAL HIGH (ref 0.76–1.27)
GFR calc Af Amer: 64 mL/min/{1.73_m2} (ref >59)
GFR calc non Af Amer: 55 mL/min/{1.73_m2} — ABNORMAL LOW (ref >59)
Globulin, Total: 3.1 g/dL (ref 1.5–4.5)
Glucose: 89 mg/dL (ref 65–99)
Potassium: 3.9 mmol/L (ref 3.5–5.2)
Sodium: 140 mmol/L (ref 134–144)
Total Protein: 7.8 g/dL (ref 6.0–8.5)

## 2019-05-31 LAB — LIPID PANEL
Chol/HDL Ratio: 4.4 ratio (ref 0.0–5.0)
Cholesterol, Total: 149 mg/dL (ref 100–199)
HDL: 34 mg/dL — ABNORMAL LOW (ref >39)
LDL Chol Calc (NIH): 93 mg/dL (ref 0–99)
Triglycerides: 120 mg/dL (ref 0–149)
VLDL Cholesterol Cal: 22 mg/dL (ref 5–40)

## 2019-05-31 LAB — BRAIN NATRIURETIC PEPTIDE: BNP: 9.8 pg/mL (ref 0.0–100.0)

## 2019-05-31 LAB — HEMOGLOBIN A1C
Est. average glucose Bld gHb Est-mCnc: 103 mg/dL
Hgb A1c MFr Bld: 5.2 % (ref 4.8–5.6)

## 2019-06-01 ENCOUNTER — Encounter: Payer: Self-pay | Admitting: Registered Nurse

## 2019-06-01 ENCOUNTER — Ambulatory Visit (INDEPENDENT_AMBULATORY_CARE_PROVIDER_SITE_OTHER): Payer: 59 | Admitting: Cardiology

## 2019-06-01 ENCOUNTER — Encounter: Payer: Self-pay | Admitting: Cardiology

## 2019-06-01 ENCOUNTER — Other Ambulatory Visit: Payer: Self-pay

## 2019-06-01 VITALS — BP 152/94 | HR 64 | Ht 74.02 in | Wt 381.0 lb

## 2019-06-01 DIAGNOSIS — Z720 Tobacco use: Secondary | ICD-10-CM

## 2019-06-01 DIAGNOSIS — I10 Essential (primary) hypertension: Secondary | ICD-10-CM

## 2019-06-01 DIAGNOSIS — G4733 Obstructive sleep apnea (adult) (pediatric): Secondary | ICD-10-CM

## 2019-06-01 DIAGNOSIS — R600 Localized edema: Secondary | ICD-10-CM

## 2019-06-01 DIAGNOSIS — E785 Hyperlipidemia, unspecified: Secondary | ICD-10-CM

## 2019-06-01 DIAGNOSIS — N181 Chronic kidney disease, stage 1: Secondary | ICD-10-CM

## 2019-06-01 MED ORDER — LOSARTAN POTASSIUM 50 MG PO TABS: 50.0000 mg | ORAL_TABLET | Freq: Every day | ORAL | 2 refills | Status: DC

## 2019-06-01 MED ORDER — AMLODIPINE BESYLATE 5 MG PO TABS: 5.0000 mg | ORAL_TABLET | Freq: Every day | ORAL | 2 refills | Status: DC

## 2019-06-01 NOTE — Progress Notes (Signed)
Results not of concern at this time MyChart letter sent to patient  Kathrin Ruddy, NP

## 2019-06-01 NOTE — Progress Notes (Signed)
Cardiology Office Note:    Date:  06/01/2019   ID:  Calvin Bailey, DOB October 25, 1980, MRN [id number]  PCP:  Maximiano Coss, NP  Cardiologist:  No primary care provider on file.  Electrophysiologist:  None   Referring MD: Maximiano Coss, NP   The patient was referred by is pcp for uncontrolled hypertension.  History of Present Illness:    Calvin Bailey is a 38 y.o. male with past medical history of hypertension - which was diagnosed 7 years ago and is currently taking carvedilol 25 mg twice daily, amlodipine 10 mg daily, his Lasix was recently increased to 40mg  daily. Other medical conditions include GERD, obesity, and OSA. Per the patient he did see his primary care provider on September 2,2020 at which time his blood pressure was significantly high and he was advised to see cardiology urgently.    He states that sometimes when he has elevated blood pressure he experiences headaches but however today in the office he does not have any headaches, chest pain, palpitations nausea, and dizziness.    The patient does admit to medication noncompliance and states that he does have a difficult time remembering to take his medication daily.   In addition, he reports intermittent shortness of breath on exertion.  He notes that at times when he is working and feels that if he exerts himself too much he does get short of breath which resolves with rest however he denies any chest pain with this. He denies any associated symptoms.   Past Medical History:  Diagnosis Date  . GERD (gastroesophageal reflux disease)   . HTN (hypertension)   . Obesity hypoventilation syndrome ([place]) 04/08/2014  . OSA (obstructive sleep apnea) 04/08/2014  . Sleep apnea     Past Surgical History:  Procedure Laterality Date  . two knee surgeries Left 1995    Current Medications: Current Meds  Medication Sig  . carvedilol (COREG) 25 MG tablet Take 1 tablet (25 mg total) by mouth 2 (two) times daily with a meal.  .  furosemide (LASIX) 40 MG tablet Take 1 tablet (40 mg total) by mouth daily.  . [DISCONTINUED] amLODipine (NORVASC) 10 MG tablet Take 1 tablet (10 mg total) by mouth daily.     Allergies:   Chlorthalidone and Lisinopril   Social History   Socioeconomic History  . Marital status: Single    Spouse name: Not on file  . Number of children: 1  . Years of education: college  . Highest education level: Not on file  Occupational History  . Occupation: unemployment    Fish farm manager: UNEMPLOYED    Employer: [employer]  Social Needs  . Financial resource strain: Not on file  . Food insecurity    Worry: Not on file    Inability: Not on file  . Transportation needs    Medical: Not on file    Non-medical: Not on file  Tobacco Use  . Smoking status: Current Every Day Smoker    Packs/day: 1.00    Years: 16.00    Pack years: 16.00    Types: Cigarettes  . Smokeless tobacco: Never Used  Substance and Sexual Activity  . Alcohol use: Yes    Alcohol/week: 0.0 standard drinks    Comment: sparingly   . Drug use: No  . Sexual activity: Yes    Partners: Female  Lifestyle  . Physical activity    Days per week: Not on file    Minutes per session: Not on file  . Stress:  Not on file  Relationships  . Social Herbalist on phone: Not on file    Gets together: Not on file    Attends religious service: Not on file    Active member of club or organization: Not on file    Attends meetings of clubs or organizations: Not on file    Relationship status: Not on file  Other Topics Concern  . 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   Patient has one child.   Patient works at [employer].   Patient has a college education.   Patient is left-handed.   Patient does not drink any caffeine.     Family History: The patient's family history includes CVA in his maternal uncle; Diabetes in his maternal grandmother; Heart failure in  his maternal grandfather and paternal grandfather; Hyperlipidemia in his paternal grandfather; Hypertension in his brother, father, and mother; Multiple sclerosis in his mother.  ROS:   Please see the history of present illness.   Review of Systems  Constitution: Negative for diaphoresis, fever and weight loss.  HENT: Negative for congestion, hearing loss and nosebleeds.   Eyes: Negative for blurred vision, pain and redness.  Cardiovascular: Negative for chest pain, leg swelling, orthopnea and palpitations.  Respiratory: Positive for shortness of breath. Negative for cough and wheezing.   Musculoskeletal: Negative for joint pain and neck pain.  Gastrointestinal: Positive for heartburn. Negative for nausea and vomiting.  Genitourinary: Negative for dysuria, flank pain and hematuria.  Neurological: Negative for dizziness, headaches and tremors.  Psychiatric/Behavioral: Negative for depression and hallucinations.   Review of Systems  Constitutional: Negative for diaphoresis, fever and weight loss.  HENT: Negative for congestion, hearing loss and nosebleeds.   Eyes: Negative for blurred vision, pain and redness.  Respiratory: Positive for shortness of breath. Negative for cough and wheezing.   Cardiovascular: Negative for chest pain, palpitations, orthopnea and leg swelling.  Gastrointestinal: Positive for heartburn. Negative for nausea and vomiting.  Genitourinary: Negative for dysuria, flank pain and hematuria.  Musculoskeletal: Negative for joint pain and neck pain.  Neurological: Negative for dizziness, tremors and headaches.  Psychiatric/Behavioral: Negative for depression and hallucinations.    EKGs/Labs/Other Studies Reviewed:    The following studies were reviewed today:  ECHO 2018  study Conclusions:  - Procedure narrative: Transthoracic echocardiography. Image   quality was suboptimal. The study was technically difficult.   Intravenous contrast (Definity) was administered. -  Left ventricle: The cavity size was normal. There was severe   concentric hypertrophy. Systolic function was normal. The   estimated ejection fraction was in the range of 55% to 60%. Wall   motion was normal; there were no regional wall motion   abnormalities. The study is not technically sufficient to allow   evaluation of LV diastolic function. - Aortic valve: Transvalvular velocity was within the normal range.   There was no stenosis. There was no regurgitation. - Mitral valve: Transvalvular velocity was within the normal range.   There was no evidence for stenosis. There was no regurgitation. - Left atrium: The atrium was moderately to severely dilated. - Right ventricle: The cavity size was normal. Wall thickness was   normal. Systolic function was normal. - Atrial septum: No defect or patent foramen ovale was identified. - Tricuspid valve: There was no regurgitation.  EKG:  EKG ordered today.  The ekg ordered today demonstrates evidence of sinus rhythm with a heart  rate of 64 bpm with nonspecific ST changes.   Recent Labs: 05/30/2019: ALT 29; BNP 9.8; BUN 20; Creatinine, Ser 1.56; Hemoglobin 15.9; Platelets 230; Potassium 3.9; Sodium 140; TSH 1.210  Recent Lipid Panel    Component Value Date/Time   CHOL 149 05/30/2019 1500   TRIG 120 05/30/2019 1500   HDL 34 (L) 05/30/2019 1500   CHOLHDL 4.4 05/30/2019 1500   CHOLHDL 5.1 02/23/2011 1223   VLDL 34 02/23/2011 1223   LDLCALC 62 02/15/2018 1639    Physical Exam:    VS:  BP (!) 152/94   Pulse 64   Ht 6' 2.02" (1.88 m)   Wt (!) 381 lb (172.8 kg)   BMI 48.89 kg/m     Wt Readings from Last 3 Encounters:  06/01/19 (!) 381 lb (172.8 kg)  05/30/19 (!) 376 lb (170.6 kg)  10/31/18 (!) 369 lb (167.4 kg)     GEN: Obese and is in no acute acute distress HEENT: MMM, sclera white and conjunctiva pink NECK: No JVD; No carotid bruits, short neck LYMPHATICS: No lymphadenopathy CARDIAC: S1, S2 noted with RRR, no murmurs, rubs,  gallops RESPIRATORY:  Clear to auscultation without rales, wheezing or rhonchi  ABDOMEN: Soft, non-tender, truncal obesity. EXTREMITIES: bilateral +1 edema. MUSCULOSKELETAL:  No edema; No deformity  SKIN: Warm and dry NEUROLOGIC:  Alert and oriented x 3, non-focal PSYCHIATRIC:  Normal affect   ASSESSMENT:   This is a 65yoM with past medical hx of hypertension, morbid obesity, OSA and chronic Tobacco use who was referred for uncontrolled hypertension: 1. Uncontrolled stage 2 hypertension   2. OSA (obstructive sleep apnea)   3. Chronic kidney disease (CKD) stage G1/A1, glomerular filtration rate (GFR) equal to or greater than 90 mL/min/1.73 square meter and albuminuria creatinine ratio less than 30 mg/g   4. Morbid obesity ([place])   5. Tobacco use   6. Bilateral leg edema   7. Dyslipidemia    PLAN:    It did see Mr. Gwyndolyn Saxon in the office today be see the plan below.  1. Uncontrolled Stage 2 hypertension - he will remain on carvediolol 25mg  BID and Lasix 40mg  daily for now. His Amlodipine 10mg  was /decreased to 5mg  daily. I am concerned the Amlodipine is also contributing to his leg edema. Losartan 50mg  daily have been added to his regimen.  We discussed at length the importance of medication compliance and the benefits associated with getting a controlled blood pressure. My goal blood pressure for his is 130/80mg . He was advised about taking his blood pressure at home daily. He also will need to make some adjustments to his diet to opt for less sodium modify his diet to include more fruits and vegetables. I explain to patient that we may need to optimize his blood pressure medications which will include increasing his Losartan dose and adding additional medications. He will return in 1 week for blood pressure check and follow up. At which time a bmp will be checked. His ekg does have nonspecific t-wave inversion but otherwise normal.    2. Chronic Kidney Disease - Cr reviewed from lab performed  on 05/30/2019 is 1.56. He was started on Losartan. Repeat CMP in 1 week.   3.OSA- he reports use of his cpap. He does not follow with a pulmonologist. He may need a repeat sleep study for optimal adjustments.   4. Morbid Obesity - He was encourage to consider loosing weight , and lifestyle modification advised.   5. Tobacco use - he is a  everday smoker and is not ready to quit smoking yet. I educated patient on the risk and benefits of smoking.  6. Bilateral leg edema - his Lasix was increased to 40mg  daily by is pcp and he can remain that dose for now. Repeat bmp in 1 week.    7. Dylipidemia - HDL is 34 from lab on 05/30/2019, this seems to be improving based on previous lab performed on 02/15/2018 at that time it was 29.  The patient is in agreement with the above plan and all of his questions were answered.  He is agreeable for a follow up visit in 1 week.   Medication Adjustments/Labs and Tests Ordered: Current medicines are reviewed at length with the patient today.  Concerns regarding medicines are outlined above.  Orders Placed This Encounter  Procedures  . COMPLETE METABOLIC PANEL WITH GFR  . EKG 12-Lead   Meds ordered this encounter  Medications  . losartan (COZAAR) 50 MG tablet    Sig: Take 1 tablet (50 mg total) by mouth daily.    Dispense:  30 tablet    Refill:  2  . amLODipine (NORVASC) 5 MG tablet    Sig: Take 1 tablet (5 mg total) by mouth daily.    Dispense:  30 tablet    Refill:  2    Patient Instructions  Medication Instructions:  Your physician has recommended you make the following change in your medication:   DECREASE Amlodipine 5 mg : Take 1 tab Daily  START: Losartan 50 mg : Take 1 tab Daily  If you need a refill on your cardiac medications before your next appointment, please call your pharmacy.   Lab work: Your physician recommends that you return for lab work in:  AT [place]  If you have labs (blood work) drawn today and your tests are  completely normal, you will receive your results only by: Marland Kitchen MyChart Message (if you have MyChart) OR . A paper copy in the mail If you have any lab test that is abnormal or we need to change your treatment, we will call you to review the results.  Testing/Procedures: None  Follow-Up: At [facility], you and your health needs are our priority.  As part of our continuing mission to provide you with exceptional heart care, we have created designated Provider Care Teams.  These Care Teams include your primary Cardiologist (physician) and Advanced Practice Providers (APPs -  Physician Assistants and Nurse Practitioners) who all work together to provide you with the care you need, when you need it. You will need a follow up appointment in 1 weeks with Dr. Harriet Masson Any Other Special Instructions Will Be Listed Below (If Applicable).    Coping with Quitting Smoking  Quitting smoking is a physical and mental challenge. You will face cravings, withdrawal symptoms, and temptation. Before quitting, work with your health care provider to make a plan that can help you cope. Preparation can help you quit and keep you from giving in. How can I cope with cravings? Cravings usually last for 5-10 minutes. If you get through it, the craving will pass. Consider taking the following actions to help you cope with cravings:  Keep your mouth busy: ? Chew sugar-free gum. ? Suck on hard candies or a straw. ? Brush your teeth.  Keep your hands and body busy: ? Immediately change to a different activity when you feel a craving. ? Squeeze or play with a ball. ? Do an activity or a  hobby, like making bead jewelry, practicing needlepoint, or working with wood. ? Mix up your normal routine. ? Take a short exercise break. Go for a quick walk or run up and down stairs. ? Spend time in public places where smoking is not allowed.  Focus on doing something kind or helpful for someone else.  Call a friend or family  member to talk during a craving.  Join a support group.  Call a quit line, such as 1-800-QUIT-NOW.  Talk with your health care provider about medicines that might help you cope with cravings and make quitting easier for you. How can I deal with withdrawal symptoms? Your body may experience negative effects as it tries to get used to not having nicotine in the system. These effects are called withdrawal symptoms. They may include:  Feeling hungrier than normal.  Trouble concentrating.  Irritability.  Trouble sleeping.  Feeling depressed.  Restlessness and agitation.  Craving a cigarette. To manage withdrawal symptoms:  Avoid places, people, and activities that trigger your cravings.  Remember why you want to quit.  Get plenty of sleep.  Avoid coffee and other caffeinated drinks. These may worsen some of your symptoms. How can I handle social situations? Social situations can be difficult when you are quitting smoking, especially in the first few weeks. To manage this, you can:  Avoid parties, bars, and other social situations where people might be smoking.  Avoid alcohol.  Leave right away if you have the urge to smoke.  Explain to your family and friends that you are quitting smoking. Ask for understanding and support.  Plan activities with friends or family where smoking is not an option. What are some ways I can cope with stress? Wanting to smoke may cause stress, and stress can make you want to smoke. Find ways to manage your stress. Relaxation techniques can help. For example:  Breathe slowly and deeply, in through your nose and out through your mouth.  Listen to soothing, relaxing music.  Talk with a family member or friend about your stress.  Light a candle.  Soak in a bath or take a shower.  Think about a peaceful place. What are some ways I can prevent weight gain? Be aware that many people gain weight after they quit smoking. However, not everyone  does. To keep from gaining weight, have a plan in place before you quit and stick to the plan after you quit. Your plan should include:  Having healthy snacks. When you have a craving, it may help to: ? Eat plain popcorn, crunchy carrots, celery, or other cut vegetables. ? Chew sugar-free gum.  Changing how you eat: ? Eat small portion sizes at meals. ? Eat 4-6 small meals throughout the day instead of 1-2 large meals a day. ? Be mindful when you eat. Do not watch television or do other things that might distract you as you eat.  Exercising regularly: ? Make time to exercise each day. If you do not have time for a long workout, do short bouts of exercise for 5-10 minutes several times a day. ? Do some form of strengthening exercise, like weight lifting, and some form of aerobic exercise, like running or swimming.  Drinking plenty of water or other low-calorie or no-calorie drinks. Drink 6-8 glasses of water daily, or as much as instructed by your health care provider. Summary  Quitting smoking is a physical and mental challenge. You will face cravings, withdrawal symptoms, and temptation to smoke again. Preparation can  help you as you go through these challenges.  You can cope with cravings by keeping your mouth busy (such as by chewing gum), keeping your body and hands busy, and making calls to family, friends, or a helpline for people who want to quit smoking.  You can cope with withdrawal symptoms by avoiding places where people smoke, avoiding drinks with caffeine, and getting plenty of rest.  Ask your health care provider about the different ways to prevent weight gain, avoid stress, and handle social situations. This information is not intended to replace advice given to you by your health care provider. Make sure you discuss any questions you have with your health care provider. Document Released: 09/10/2016 Document Revised: 08/26/2017 Document Reviewed: 09/10/2016 Elsevier Patient  Education  [place].  Low-Sodium Eating Plan Sodium, which is an element that makes up salt, helps you maintain a healthy balance of fluids in your body. Too much sodium can increase your blood pressure and cause fluid and waste to be held in your body. Your health care provider or dietitian may recommend following this plan if you have high blood pressure (hypertension), kidney disease, liver disease, or heart failure. Eating less sodium can help lower your blood pressure, reduce swelling, and protect your heart, liver, and kidneys. What are tips for following this plan? General guidelines  Most people on this plan should limit their sodium intake to 1,500-2,000 mg (milligrams) of sodium each day. Reading food labels   The Nutrition Facts label lists the amount of sodium in one serving of the food. If you eat more than one serving, you must multiply the listed amount of sodium by the number of servings.  Choose foods with less than 140 mg of sodium per serving.  Avoid foods with 300 mg of sodium or more per serving. Shopping  Look for lower-sodium products, often labeled as "low-sodium" or "no salt added."  Always check the sodium content even if foods are labeled as "unsalted" or "no salt added".  Buy fresh foods. ? Avoid canned foods and premade or frozen meals. ? Avoid canned, cured, or processed meats  Buy breads that have less than 80 mg of sodium per slice. Cooking  Eat more home-cooked food and less restaurant, buffet, and fast food.  Avoid adding salt when cooking. Use salt-free seasonings or herbs instead of table salt or sea salt. Check with your health care provider or pharmacist before using salt substitutes.  Cook with plant-based oils, such as canola, sunflower, or olive oil. Meal planning  When eating at a restaurant, ask that your food be prepared with less salt or no salt, if possible.  Avoid foods that contain MSG (monosodium glutamate). MSG is  sometimes added to [place] food, bouillon, and some canned foods. What foods are recommended? The items listed may not be a complete list. Talk with your dietitian about what dietary choices are best for you. Grains Low-sodium cereals, including oats, puffed wheat and rice, and shredded wheat. Low-sodium crackers. Unsalted rice. Unsalted pasta. Low-sodium bread. Whole-grain breads and whole-grain pasta. Vegetables Fresh or frozen vegetables. "No salt added" canned vegetables. "No salt added" tomato sauce and paste. Low-sodium or reduced-sodium tomato and vegetable juice. Fruits Fresh, frozen, or canned fruit. Fruit juice. Meats and other protein foods Fresh or frozen (no salt added) meat, poultry, seafood, and fish. Low-sodium canned tuna and salmon. Unsalted nuts. Dried peas, beans, and lentils without added salt. Unsalted canned beans. Eggs. Unsalted nut butters. Dairy Milk. Soy milk. Cheese that  is naturally low in sodium, such as ricotta cheese, fresh mozzarella, or Swiss cheese Low-sodium or reduced-sodium cheese. Cream cheese. Yogurt. Fats and oils Unsalted butter. Unsalted margarine with no trans fat. Vegetable oils such as canola or olive oils. Seasonings and other foods Fresh and dried herbs and spices. Salt-free seasonings. Low-sodium mustard and ketchup. Sodium-free salad dressing. Sodium-free light mayonnaise. Fresh or refrigerated horseradish. Lemon juice. Vinegar. Homemade, reduced-sodium, or low-sodium soups. Unsalted popcorn and pretzels. Low-salt or salt-free chips. What foods are not recommended? The items listed may not be a complete list. Talk with your dietitian about what dietary choices are best for you. Grains Instant hot cereals. Bread stuffing, pancake, and biscuit mixes. Croutons. Seasoned rice or pasta mixes. Noodle soup cups. Boxed or frozen macaroni and cheese. Regular salted crackers. Self-rising flour. Vegetables Sauerkraut, pickled vegetables, and relishes.  Olives. [place] fries. Onion rings. Regular canned vegetables (not low-sodium or reduced-sodium). Regular canned tomato sauce and paste (not low-sodium or reduced-sodium). Regular tomato and vegetable juice (not low-sodium or reduced-sodium). Frozen vegetables in sauces. Meats and other protein foods Meat or fish that is salted, canned, smoked, spiced, or pickled. Bacon, ham, sausage, hotdogs, corned beef, chipped beef, packaged lunch meats, salt pork, jerky, pickled herring, anchovies, regular canned tuna, sardines, salted nuts. Dairy Processed cheese and cheese spreads. Cheese curds. Blue cheese. Feta cheese. String cheese. Regular cottage cheese. Buttermilk. Canned milk. Fats and oils Salted butter. Regular margarine. Ghee. Bacon fat. Seasonings and other foods Onion salt, garlic salt, seasoned salt, table salt, and sea salt. Canned and packaged gravies. Worcestershire sauce. Tartar sauce. Barbecue sauce. Teriyaki sauce. Soy sauce, including reduced-sodium. Steak sauce. Fish sauce. Oyster sauce. Cocktail sauce. Horseradish that you find on the shelf. Regular ketchup and mustard. Meat flavorings and tenderizers. Bouillon cubes. Hot sauce and Tabasco sauce. Premade or packaged marinades. Premade or packaged taco seasonings. Relishes. Regular salad dressings. Salsa. Potato and tortilla chips. Corn chips and puffs. Salted popcorn and pretzels. Canned or dried soups. Pizza. Frozen entrees and pot pies. Summary  Eating less sodium can help lower your blood pressure, reduce swelling, and protect your heart, liver, and kidneys.  Most people on this plan should limit their sodium intake to 1,500-2,000 mg (milligrams) of sodium each day.  Canned, boxed, and frozen foods are high in sodium. Restaurant foods, fast foods, and pizza are also very high in sodium. You also get sodium by adding salt to food.  Try to cook at home, eat more fresh fruits and vegetables, and eat less fast food, canned, processed, or  prepared foods. This information is not intended to replace advice given to you by your health care provider. Make sure you discuss any questions you have with your health care provider. Document Released: 03/05/2002 Document Revised: 08/26/2017 Document Reviewed: 09/06/2016 Elsevier Patient Education  [address]     Signed, Berniece Salines, DO  06/01/2019 12:50 PM    [place]

## 2019-06-01 NOTE — Patient Instructions (Signed)
Medication Instructions:  Your physician has recommended you make the following change in your medication:   DECREASE Amlodipine 5 mg : Take 1 tab Daily  START: Losartan 50 mg : Take 1 tab Daily  If you need a refill on your cardiac medications before your next appointment, please call your pharmacy.   Lab work: Your physician recommends that you return for lab work in:  AT [place]  If you have labs (blood work) drawn today and your tests are completely normal, you will receive your results only by: Marland Kitchen MyChart Message (if you have MyChart) OR . A paper copy in the mail If you have any lab test that is abnormal or we need to change your treatment, we will call you to review the results.  Testing/Procedures: None  Follow-Up: At [facility], you and your health needs are our priority.  As part of our continuing mission to provide you with exceptional heart care, we have created designated Provider Care Teams.  These Care Teams include your primary Cardiologist (physician) and Advanced Practice Providers (APPs -  Physician Assistants and Nurse Practitioners) who all work together to provide you with the care you need, when you need it. You will need a follow up appointment in 1 weeks with Dr. Harriet Masson Any Other Special Instructions Will Be Listed Below (If Applicable).    Coping with Quitting Smoking  Quitting smoking is a physical and mental challenge. You will face cravings, withdrawal symptoms, and temptation. Before quitting, work with your health care provider to make a plan that can help you cope. Preparation can help you quit and keep you from giving in. How can I cope with cravings? Cravings usually last for 5-10 minutes. If you get through it, the craving will pass. Consider taking the following actions to help you cope with cravings:  Keep your mouth busy: ? Chew sugar-free gum. ? Suck on hard candies or a straw. ? Brush your teeth.  Keep your hands and body  busy: ? Immediately change to a different activity when you feel a craving. ? Squeeze or play with a ball. ? Do an activity or a hobby, like making bead jewelry, practicing needlepoint, or working with wood. ? Mix up your normal routine. ? Take a short exercise break. Go for a quick walk or run up and down stairs. ? Spend time in public places where smoking is not allowed.  Focus on doing something kind or helpful for someone else.  Call a friend or family member to talk during a craving.  Join a support group.  Call a quit line, such as 1-800-QUIT-NOW.  Talk with your health care provider about medicines that might help you cope with cravings and make quitting easier for you. How can I deal with withdrawal symptoms? Your body may experience negative effects as it tries to get used to not having nicotine in the system. These effects are called withdrawal symptoms. They may include:  Feeling hungrier than normal.  Trouble concentrating.  Irritability.  Trouble sleeping.  Feeling depressed.  Restlessness and agitation.  Craving a cigarette. To manage withdrawal symptoms:  Avoid places, people, and activities that trigger your cravings.  Remember why you want to quit.  Get plenty of sleep.  Avoid coffee and other caffeinated drinks. These may worsen some of your symptoms. How can I handle social situations? Social situations can be difficult when you are quitting smoking, especially in the first few weeks. To manage this, you can:  Avoid  parties, bars, and other social situations where people might be smoking.  Avoid alcohol.  Leave right away if you have the urge to smoke.  Explain to your family and friends that you are quitting smoking. Ask for understanding and support.  Plan activities with friends or family where smoking is not an option. What are some ways I can cope with stress? Wanting to smoke may cause stress, and stress can make you want to smoke. Find  ways to manage your stress. Relaxation techniques can help. For example:  Breathe slowly and deeply, in through your nose and out through your mouth.  Listen to soothing, relaxing music.  Talk with a family member or friend about your stress.  Light a candle.  Soak in a bath or take a shower.  Think about a peaceful place. What are some ways I can prevent weight gain? Be aware that many people gain weight after they quit smoking. However, not everyone does. To keep from gaining weight, have a plan in place before you quit and stick to the plan after you quit. Your plan should include:  Having healthy snacks. When you have a craving, it may help to: ? Eat plain popcorn, crunchy carrots, celery, or other cut vegetables. ? Chew sugar-free gum.  Changing how you eat: ? Eat small portion sizes at meals. ? Eat 4-6 small meals throughout the day instead of 1-2 large meals a day. ? Be mindful when you eat. Do not watch television or do other things that might distract you as you eat.  Exercising regularly: ? Make time to exercise each day. If you do not have time for a long workout, do short bouts of exercise for 5-10 minutes several times a day. ? Do some form of strengthening exercise, like weight lifting, and some form of aerobic exercise, like running or swimming.  Drinking plenty of water or other low-calorie or no-calorie drinks. Drink 6-8 glasses of water daily, or as much as instructed by your health care provider. Summary  Quitting smoking is a physical and mental challenge. You will face cravings, withdrawal symptoms, and temptation to smoke again. Preparation can help you as you go through these challenges.  You can cope with cravings by keeping your mouth busy (such as by chewing gum), keeping your body and hands busy, and making calls to family, friends, or a helpline for people who want to quit smoking.  You can cope with withdrawal symptoms by avoiding places where people  smoke, avoiding drinks with caffeine, and getting plenty of rest.  Ask your health care provider about the different ways to prevent weight gain, avoid stress, and handle social situations. This information is not intended to replace advice given to you by your health care provider. Make sure you discuss any questions you have with your health care provider. Document Released: 09/10/2016 Document Revised: 08/26/2017 Document Reviewed: 09/10/2016 Elsevier Patient Education  [place].  Low-Sodium Eating Plan Sodium, which is an element that makes up salt, helps you maintain a healthy balance of fluids in your body. Too much sodium can increase your blood pressure and cause fluid and waste to be held in your body. Your health care provider or dietitian may recommend following this plan if you have high blood pressure (hypertension), kidney disease, liver disease, or heart failure. Eating less sodium can help lower your blood pressure, reduce swelling, and protect your heart, liver, and kidneys. What are tips for following this plan? General guidelines  Most people  on this plan should limit their sodium intake to 1,500-2,000 mg (milligrams) of sodium each day. Reading food labels   The Nutrition Facts label lists the amount of sodium in one serving of the food. If you eat more than one serving, you must multiply the listed amount of sodium by the number of servings.  Choose foods with less than 140 mg of sodium per serving.  Avoid foods with 300 mg of sodium or more per serving. Shopping  Look for lower-sodium products, often labeled as "low-sodium" or "no salt added."  Always check the sodium content even if foods are labeled as "unsalted" or "no salt added".  Buy fresh foods. ? Avoid canned foods and premade or frozen meals. ? Avoid canned, cured, or processed meats  Buy breads that have less than 80 mg of sodium per slice. Cooking  Eat more home-cooked food and less  restaurant, buffet, and fast food.  Avoid adding salt when cooking. Use salt-free seasonings or herbs instead of table salt or sea salt. Check with your health care provider or pharmacist before using salt substitutes.  Cook with plant-based oils, such as canola, sunflower, or olive oil. Meal planning  When eating at a restaurant, ask that your food be prepared with less salt or no salt, if possible.  Avoid foods that contain MSG (monosodium glutamate). MSG is sometimes added to [place] food, bouillon, and some canned foods. What foods are recommended? The items listed may not be a complete list. Talk with your dietitian about what dietary choices are best for you. Grains Low-sodium cereals, including oats, puffed wheat and rice, and shredded wheat. Low-sodium crackers. Unsalted rice. Unsalted pasta. Low-sodium bread. Whole-grain breads and whole-grain pasta. Vegetables Fresh or frozen vegetables. "No salt added" canned vegetables. "No salt added" tomato sauce and paste. Low-sodium or reduced-sodium tomato and vegetable juice. Fruits Fresh, frozen, or canned fruit. Fruit juice. Meats and other protein foods Fresh or frozen (no salt added) meat, poultry, seafood, and fish. Low-sodium canned tuna and salmon. Unsalted nuts. Dried peas, beans, and lentils without added salt. Unsalted canned beans. Eggs. Unsalted nut butters. Dairy Milk. Soy milk. Cheese that is naturally low in sodium, such as ricotta cheese, fresh mozzarella, or Swiss cheese Low-sodium or reduced-sodium cheese. Cream cheese. Yogurt. Fats and oils Unsalted butter. Unsalted margarine with no trans fat. Vegetable oils such as canola or olive oils. Seasonings and other foods Fresh and dried herbs and spices. Salt-free seasonings. Low-sodium mustard and ketchup. Sodium-free salad dressing. Sodium-free light mayonnaise. Fresh or refrigerated horseradish. Lemon juice. Vinegar. Homemade, reduced-sodium, or low-sodium soups. Unsalted  popcorn and pretzels. Low-salt or salt-free chips. What foods are not recommended? The items listed may not be a complete list. Talk with your dietitian about what dietary choices are best for you. Grains Instant hot cereals. Bread stuffing, pancake, and biscuit mixes. Croutons. Seasoned rice or pasta mixes. Noodle soup cups. Boxed or frozen macaroni and cheese. Regular salted crackers. Self-rising flour. Vegetables Sauerkraut, pickled vegetables, and relishes. Olives. [place] fries. Onion rings. Regular canned vegetables (not low-sodium or reduced-sodium). Regular canned tomato sauce and paste (not low-sodium or reduced-sodium). Regular tomato and vegetable juice (not low-sodium or reduced-sodium). Frozen vegetables in sauces. Meats and other protein foods Meat or fish that is salted, canned, smoked, spiced, or pickled. Bacon, ham, sausage, hotdogs, corned beef, chipped beef, packaged lunch meats, salt pork, jerky, pickled herring, anchovies, regular canned tuna, sardines, salted nuts. Dairy Processed cheese and cheese spreads. Cheese curds. Blue cheese. Feta cheese. String  cheese. Regular cottage cheese. Buttermilk. Canned milk. Fats and oils Salted butter. Regular margarine. Ghee. Bacon fat. Seasonings and other foods Onion salt, garlic salt, seasoned salt, table salt, and sea salt. Canned and packaged gravies. Worcestershire sauce. Tartar sauce. Barbecue sauce. Teriyaki sauce. Soy sauce, including reduced-sodium. Steak sauce. Fish sauce. Oyster sauce. Cocktail sauce. Horseradish that you find on the shelf. Regular ketchup and mustard. Meat flavorings and tenderizers. Bouillon cubes. Hot sauce and Tabasco sauce. Premade or packaged marinades. Premade or packaged taco seasonings. Relishes. Regular salad dressings. Salsa. Potato and tortilla chips. Corn chips and puffs. Salted popcorn and pretzels. Canned or dried soups. Pizza. Frozen entrees and pot pies. Summary  Eating less sodium can help lower  your blood pressure, reduce swelling, and protect your heart, liver, and kidneys.  Most people on this plan should limit their sodium intake to 1,500-2,000 mg (milligrams) of sodium each day.  Canned, boxed, and frozen foods are high in sodium. Restaurant foods, fast foods, and pizza are also very high in sodium. You also get sodium by adding salt to food.  Try to cook at home, eat more fresh fruits and vegetables, and eat less fast food, canned, processed, or prepared foods. This information is not intended to replace advice given to you by your health care provider. Make sure you discuss any questions you have with your health care provider. Document Released: 03/05/2002 Document Revised: 08/26/2017 Document Reviewed: 09/06/2016 Elsevier Patient Education  2020 [employer].

## 2019-06-08 ENCOUNTER — Encounter: Payer: Self-pay | Admitting: Cardiology

## 2019-06-08 ENCOUNTER — Other Ambulatory Visit: Payer: Self-pay

## 2019-06-08 ENCOUNTER — Ambulatory Visit (INDEPENDENT_AMBULATORY_CARE_PROVIDER_SITE_OTHER): Payer: 59 | Admitting: Cardiology

## 2019-06-08 VITALS — BP 160/110 | HR 63 | Temp 97.7°F | Ht 74.02 in | Wt 380.0 lb

## 2019-06-08 DIAGNOSIS — I10 Essential (primary) hypertension: Secondary | ICD-10-CM

## 2019-06-08 DIAGNOSIS — Z720 Tobacco use: Secondary | ICD-10-CM

## 2019-06-08 MED ORDER — CARVEDILOL 25 MG PO TABS: 25.0000 mg | ORAL_TABLET | Freq: Two times a day (BID) | ORAL | 1 refills | Status: DC

## 2019-06-08 MED ORDER — SPIRONOLACTONE 25 MG PO TABS: 12.5000 mg | ORAL_TABLET | Freq: Every day | ORAL | 1 refills | Status: DC

## 2019-06-08 NOTE — Patient Instructions (Signed)
Medication Instructions:  Your physician has recommended you make the following change in your medication:   START: Spironolactone 25 mg: Take 1/2 tab daily  If you need a refill on your cardiac medications before your next appointment, please call your pharmacy.   Lab work: Your physician recommends that you return for lab work in: TODAY BMP,Magnesium  1 month : BMP,magnesium  If you have labs (blood work) drawn today and your tests are completely normal, you will receive your results only by: Marland Kitchen MyChart Message (if you have MyChart) OR . A paper copy in the mail If you have any lab test that is abnormal or we need to change your treatment, we will call you to review the results.  Testing/Procedures: NOne  Follow-Up: At [facility], you and your health needs are our priority.  As part of our continuing mission to provide you with exceptional heart care, we have created designated Provider Care Teams.  These Care Teams include your primary Cardiologist (physician) and Advanced Practice Providers (APPs -  Physician Assistants and Nurse Practitioners) who all work together to provide you with the care you need, when you need it. You will need a follow up appointment in 1 months with Dr Harriet Masson. Any Other Special Instructions Will Be Listed Below (If Applicable).

## 2019-06-08 NOTE — Progress Notes (Signed)
Cardiology Office Note:    Date:  06/08/2019   ID:  Calvin Bailey, DOB 1981-09-19, MRN [id number]  PCP:  Maximiano Coss, NP  Cardiologist:  No primary care provider on file.  Electrophysiologist:  None   Referring MD: Maximiano Coss, NP   Chief Complaint  Patient presents with  . Follow-up    History of Present Illness:    Calvin Bailey is a 38 y.o. male with a hx of hypertension,tobacco use, CKD, OSA , dyslipidemia presents today for a follow up. I did see patient on 06/01/2019 in an initial evaluation. At which time he was on taking carvedilol 25 mg twice daily, amlodipine 10 mg daily, and Lasix 40mg  daily. During his visit his blood pressure was elevated and he did have bilateral leg edema. He medications were modified: Amlodipine was decreased to 5mg  due to concern for leg edema and Losartan 50mg  was added.   Today he tells me that he has been taking his medications however he is still trying with the decrease in salt intake.  He offer no complaints at this time.  Past Medical History:  Diagnosis Date  . GERD (gastroesophageal reflux disease)   . HTN (hypertension)   . Obesity hypoventilation syndrome ([place]) 04/08/2014  . OSA (obstructive sleep apnea) 04/08/2014  . Sleep apnea     Past Surgical History:  Procedure Laterality Date  . two knee surgeries Left 1995    Current Medications: Current Meds  Medication Sig  . amLODipine (NORVASC) 5 MG tablet Take 1 tablet (5 mg total) by mouth daily.  . carvedilol (COREG) 25 MG tablet Take 1 tablet (25 mg total) by mouth 2 (two) times daily with a meal.  . furosemide (LASIX) 40 MG tablet Take 1 tablet (40 mg total) by mouth daily.  Marland Kitchen losartan (COZAAR) 50 MG tablet Take 1 tablet (50 mg total) by mouth daily.  . [DISCONTINUED] carvedilol (COREG) 25 MG tablet Take 1 tablet (25 mg total) by mouth 2 (two) times daily with a meal.     Allergies:   Chlorthalidone and Lisinopril   Social History   Socioeconomic History  .  Marital status: Single    Spouse name: Not on file  . Number of children: 1  . Years of education: college  . Highest education level: Not on file  Occupational History  . Occupation: unemployment    Fish farm manager: UNEMPLOYED    Employer: [employer]  Social Needs  . Financial resource strain: Not on file  . Food insecurity    Worry: Not on file    Inability: Not on file  . Transportation needs    Medical: Not on file    Non-medical: Not on file  Tobacco Use  . Smoking status: Current Every Day Smoker    Packs/day: 1.00    Years: 16.00    Pack years: 16.00    Types: Cigarettes  . Smokeless tobacco: Never Used  Substance and Sexual Activity  . Alcohol use: Yes    Alcohol/week: 0.0 standard drinks    Comment: sparingly   . Drug use: No  . Sexual activity: Yes    Partners: Female  Lifestyle  . Physical activity    Days per week: Not on file    Minutes per session: Not on file  . Stress: Not on file  Relationships  . Social Herbalist on phone: Not on file    Gets together: Not on file    Attends religious service: Not on  file    Active member of club or organization: Not on file    Attends meetings of clubs or organizations: Not on file    Relationship status: Not on file  Other Topics Concern  . 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   Patient has one child.   Patient works at [employer].   Patient has a college education.   Patient is left-handed.   Patient does not drink any caffeine.     Family History: The patient's family history includes CVA in his maternal uncle; Diabetes in his maternal grandmother; Heart failure in his maternal grandfather and paternal grandfather; Hyperlipidemia in his paternal grandfather; Hypertension in his brother, father, and mother; Multiple sclerosis in his mother.  ROS:      Review of Systems  Constitutional: Negative for diaphoresis, fever and weight  loss.  HENT: Negative for congestion and tinnitus.   Eyes: Negative for blurred vision, double vision and redness.  Respiratory: Negative for cough, shortness of breath and stridor.   Cardiovascular: Negative for chest pain and palpitations.  Gastrointestinal: Negative for diarrhea, heartburn and vomiting.  Genitourinary: Negative for frequency and urgency.  Musculoskeletal: Negative for joint pain and neck pain.  Neurological: Negative for dizziness, tingling, tremors and weakness.  Endo/Heme/Allergies: Negative for environmental allergies.  Psychiatric/Behavioral: Negative for depression, memory loss and substance abuse. The patient does not have insomnia.     EKGs/Labs/Other Studies Reviewed:    The following studies were reviewed today:  Recent Labs: 05/30/2019: ALT 29; BNP 9.8; BUN 20; Creatinine, Ser 1.56; Hemoglobin 15.9; Platelets 230; Potassium 3.9; Sodium 140; TSH 1.210   Recent Lipid Panel    Component Value Date/Time   CHOL 149 05/30/2019 1500   TRIG 120 05/30/2019 1500   HDL 34 (L) 05/30/2019 1500   CHOLHDL 4.4 05/30/2019 1500   CHOLHDL 5.1 02/23/2011 1223   VLDL 34 02/23/2011 1223   LDLCALC 62 02/15/2018 1639    Physical Exam:    VS:  BP (!) 160/110 (BP Location: Right Arm, Patient Position: Sitting, Cuff Size: Large)   Pulse 63   Temp 97.7 F (36.5 C)   Ht 6' 2.02" (1.88 m)   Wt (!) 380 lb (172.4 kg)   SpO2 95%   BMI 48.76 kg/m     Wt Readings from Last 3 Encounters:  06/08/19 (!) 380 lb (172.4 kg)  06/01/19 (!) 381 lb (172.8 kg)  05/30/19 (!) 376 lb (170.6 kg)     GEN:  Well nourished, well developed in no acute distress HEENT: Normal NECK: No JVD; No carotid bruits LYMPHATICS: No lymphadenopathy CARDIAC: RRR, no murmurs, rubs, gallops RESPIRATORY:  Clear to auscultation without rales, wheezing or rhonchi  ABDOMEN: Soft, non-tender, non-distended EXTREMITIES:bilateral ankle edema, No cyanosis, no clubbing MUSCULOSKELETAL: no deformity  SKIN:  Warm and dry NEUROLOGIC:  Alert and oriented x 3 PSYCHIATRIC:  Normal affect   ASSESSMENT:    1. Essential hypertension   2. Tobacco use   3. Morbid obesity ([place])    PLAN:     There is no improvement in his blood pressure. He admits to compliance with his medication since his last visit. At this time I will add aldactone 12.5mg  daily to his regimen. I did educate the patient about this medication and its potential side effects. I also encouraged the patient to continue to take his medication and continue to try decreasing the salt in his diet. He does  have a blood pressure monitor- he was advised to take his blood pressure daily and bring the record of his blood pressure to his next visit.  Tobacco cessation advised.  Weight loss encouraged. Follow up in 1 month.    Medication Adjustments/Labs and Tests Ordered: Current medicines are reviewed at length with the patient today.  Concerns regarding medicines are outlined above.  Orders Placed This Encounter  Procedures  . Basic Metabolic Panel (BMET)  . Magnesium  . Basic Metabolic Panel (BMET)  . Magnesium   Meds ordered this encounter  Medications  . spironolactone (ALDACTONE) 25 MG tablet    Sig: Take 0.5 tablets (12.5 mg total) by mouth daily.    Dispense:  45 tablet    Refill:  1  . carvedilol (COREG) 25 MG tablet    Sig: Take 1 tablet (25 mg total) by mouth 2 (two) times daily with a meal.    Dispense:  180 tablet    Refill:  1    Patient Instructions  Medication Instructions:  Your physician has recommended you make the following change in your medication:   START: Spironolactone 25 mg: Take 1/2 tab daily  If you need a refill on your cardiac medications before your next appointment, please call your pharmacy.   Lab work: Your physician recommends that you return for lab work in: TODAY BMP,Magnesium  1 month : BMP,magnesium  If you have labs (blood work) drawn today and your tests are completely normal, you  will receive your results only by: Marland Kitchen MyChart Message (if you have MyChart) OR . A paper copy in the mail If you have any lab test that is abnormal or we need to change your treatment, we will call you to review the results.  Testing/Procedures: NOne  Follow-Up: At [facility], you and your health needs are our priority.  As part of our continuing mission to provide you with exceptional heart care, we have created designated Provider Care Teams.  These Care Teams include your primary Cardiologist (physician) and Advanced Practice Providers (APPs -  Physician Assistants and Nurse Practitioners) who all work together to provide you with the care you need, when you need it. You will need a follow up appointment in 1 months with Dr Harriet Masson. Any Other Special Instructions Will Be Listed Below (If Applicable).        Rolly Pancake, DO  06/08/2019 9:51 PM    [facility]

## 2019-06-09 LAB — BASIC METABOLIC PANEL
BUN/Creatinine Ratio: 12 (ref 9–20)
BUN: 17 mg/dL (ref 6–20)
CO2: 23 mmol/L (ref 20–29)
Calcium: 9.8 mg/dL (ref 8.7–10.2)
Chloride: 104 mmol/L (ref 96–106)
Creatinine, Ser: 1.4 mg/dL — ABNORMAL HIGH (ref 0.76–1.27)
GFR calc Af Amer: 73 mL/min/{1.73_m2} (ref >59)
GFR calc non Af Amer: 63 mL/min/{1.73_m2} (ref >59)
Glucose: 90 mg/dL (ref 65–99)
Potassium: 4.4 mmol/L (ref 3.5–5.2)
Sodium: 139 mmol/L (ref 134–144)

## 2019-06-09 LAB — MAGNESIUM: Magnesium: 2.2 mg/dL (ref 1.6–2.3)

## 2019-06-13 ENCOUNTER — Ambulatory Visit: Payer: 59 | Admitting: Registered Nurse

## 2019-06-14 ENCOUNTER — Encounter: Payer: Self-pay | Admitting: Registered Nurse

## 2019-06-26 ENCOUNTER — Other Ambulatory Visit: Payer: Self-pay | Admitting: Family Medicine

## 2019-07-06 ENCOUNTER — Other Ambulatory Visit: Payer: Self-pay

## 2019-07-06 ENCOUNTER — Ambulatory Visit (INDEPENDENT_AMBULATORY_CARE_PROVIDER_SITE_OTHER): Payer: 59 | Admitting: Cardiology

## 2019-07-06 ENCOUNTER — Encounter: Payer: Self-pay | Admitting: Cardiology

## 2019-07-06 VITALS — BP 180/86 | HR 70 | Ht 74.02 in | Wt 389.0 lb

## 2019-07-06 DIAGNOSIS — N189 Chronic kidney disease, unspecified: Secondary | ICD-10-CM | POA: Diagnosis not present

## 2019-07-06 DIAGNOSIS — G4733 Obstructive sleep apnea (adult) (pediatric): Secondary | ICD-10-CM

## 2019-07-06 DIAGNOSIS — I10 Essential (primary) hypertension: Secondary | ICD-10-CM | POA: Diagnosis not present

## 2019-07-06 MED ORDER — SPIRONOLACTONE 25 MG PO TABS: 25.0000 mg | ORAL_TABLET | Freq: Every day | ORAL | 1 refills | Status: DC

## 2019-07-06 NOTE — Patient Instructions (Signed)
Medication Instructions:  Your physician has recommended you make the following change in your medication:   INCREASE: Spironolactone to 25 mg daily   If you need a refill on your cardiac medications before your next appointment, please call your pharmacy.   Lab work: Your physician recommends that you return for lab work today: bmp, mag   If you have labs (blood work) drawn today and your tests are completely normal, you will receive your results only by: Marland Kitchen MyChart Message (if you have MyChart) OR . A paper copy in the mail If you have any lab test that is abnormal or we need to change your treatment, we will call you to review the results.  Testing/Procedures: Your physician has requested that you have a renal artery duplex. During this test, an ultrasound is used to evaluate blood flow to the kidneys. Allow one hour for this exam. Do not eat after midnight the day before and avoid carbonated beverages. Take your medications as you usually do.    Follow-Up: . Follow up in 1 month   Any Other Special Instructions Will Be Listed Below (If Applicable).

## 2019-07-06 NOTE — Progress Notes (Signed)
Cardiology Office Note:    Date:  07/06/2019   ID:  Calvin Bailey, DOB [birth year], MRN [id number]  PCP:  Maximiano Coss, NP  Cardiologist:  No primary care provider on file.  Electrophysiologist:  None   Referring MD: Maximiano Coss, NP   Chief Complaint  Patient presents with  . Follow-up    BP    History of Present Illness:    Calvin Bailey is a 38 y.o. male with a hx of  hypertension,tobacco use, CKD, OSA , dyslipidemia presents today for a follow up.   I did see patient on 06/01/2019 in an initial evaluation. At which time he was on takingcarvedilol 25 mg twice daily, amlodipine 10 mg daily,and Lasix 40mg  daily. During his visit his blood pressure was elevated and he did have bilateral leg edema. He medications were modified: Amlodipine was decreased to 5mg  due to concern for leg edema and Losartan 50mg  was added.  He was again seen on 06/08/2019 at which time his blood pressure was still above target Aldactone 12.5 mg was added to his regimen.  He reports that he has been compliant with his medical regimen.  However he is challenged with his diet and is trying to watch his salt.  He denies any shortness of breath, chest pain, nausea, vomiting.  Past Medical History:  Diagnosis Date  . GERD (gastroesophageal reflux disease)   . HTN (hypertension)   . Obesity hypoventilation syndrome ([place]) 04/08/2014  . OSA (obstructive sleep apnea) 04/08/2014  . Sleep apnea     Past Surgical History:  Procedure Laterality Date  . two knee surgeries Left 1995    Current Medications: Current Meds  Medication Sig  . amLODipine (NORVASC) 5 MG tablet Take 1 tablet (5 mg total) by mouth daily.  . carvedilol (COREG) 25 MG tablet Take 1 tablet (25 mg total) by mouth 2 (two) times daily with a meal.  . furosemide (LASIX) 40 MG tablet Take 1 tablet (40 mg total) by mouth daily.  Marland Kitchen losartan (COZAAR) 50 MG tablet Take 1 tablet (50 mg total) by mouth daily.  Marland Kitchen spironolactone (ALDACTONE)  25 MG tablet Take 1 tablet (25 mg total) by mouth daily.  . [DISCONTINUED] spironolactone (ALDACTONE) 25 MG tablet Take 0.5 tablets (12.5 mg total) by mouth daily.     Allergies:   Chlorthalidone and Lisinopril   Social History   Socioeconomic History  . Marital status: Single    Spouse name: Not on file  . Number of children: 1  . Years of education: college  . Highest education level: Not on file  Occupational History  . Occupation: unemployment    Fish farm manager: UNEMPLOYED    Employer: [employer]  Social Needs  . Financial resource strain: Not on file  . Food insecurity    Worry: Not on file    Inability: Not on file  . Transportation needs    Medical: Not on file    Non-medical: Not on file  Tobacco Use  . Smoking status: Current Every Day Smoker    Packs/day: 1.00    Years: 16.00    Pack years: 16.00    Types: Cigarettes  . Smokeless tobacco: Never Used  Substance and Sexual Activity  . Alcohol use: Yes    Alcohol/week: 0.0 standard drinks    Comment: sparingly   . Drug use: No  . Sexual activity: Yes    Partners: Female  Lifestyle  . Physical activity    Days per week: Not on file  Minutes per session: Not on file  . Stress: Not on file  Relationships  . Social Herbalist on phone: Not on file    Gets together: Not on file    Attends religious service: Not on file    Active member of club or organization: Not on file    Attends meetings of clubs or organizations: Not on file    Relationship status: Not on file  Other Topics Concern  . 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   Patient has one child.   Patient works at [employer].   Patient has a college education.   Patient is left-handed.   Patient does not drink any caffeine.     Family History: The patient's family history includes CVA in his maternal uncle; Diabetes in his maternal grandmother; Heart failure in his  maternal grandfather and paternal grandfather; Hyperlipidemia in his paternal grandfather; Hypertension in his brother, father, and mother; Multiple sclerosis in his mother.  ROS:   Review of Systems  Constitution: Negative for decreased appetite, fever and weight gain.  HENT: Negative for congestion, ear discharge, hoarse voice and sore throat.   Eyes: Negative for discharge, redness, vision loss in right eye and visual halos.  Cardiovascular: Negative for chest pain, dyspnea on exertion, leg swelling, orthopnea and palpitations.  Respiratory: Negative for cough, hemoptysis, shortness of breath and snoring.   Endocrine: Negative for heat intolerance and polyphagia.  Hematologic/Lymphatic: Negative for bleeding problem. Does not bruise/bleed easily.  Skin: Negative for flushing, nail changes, rash and suspicious lesions.  Musculoskeletal: Negative for arthritis, joint pain, muscle cramps, myalgias, neck pain and stiffness.  Gastrointestinal: Negative for abdominal pain, bowel incontinence, diarrhea and excessive appetite.  Genitourinary: Negative for decreased libido, genital sores and incomplete emptying.  Neurological: Negative for brief paralysis, focal weakness, headaches and loss of balance.  Psychiatric/Behavioral: Negative for altered mental status, depression and suicidal ideas.  Allergic/Immunologic: Negative for HIV exposure and persistent infections.    EKGs/Labs/Other Studies Reviewed:    The following studies were reviewed today:   EKG: None performed today.  Recent Labs: 05/30/2019: ALT 29; BNP 9.8; Hemoglobin 15.9; Platelets 230; TSH 1.210 06/08/2019: BUN 17; Creatinine, Ser 1.40; Magnesium 2.2; Potassium 4.4; Sodium 139  Recent Lipid Panel    Component Value Date/Time   CHOL 149 05/30/2019 1500   TRIG 120 05/30/2019 1500   HDL 34 (L) 05/30/2019 1500   CHOLHDL 4.4 05/30/2019 1500   CHOLHDL 5.1 02/23/2011 1223   VLDL 34 02/23/2011 1223   LDLCALC 93 05/30/2019 1500     Physical Exam:    VS:  BP (!) 180/86 (BP Location: Left Arm, Patient Position: Sitting, Cuff Size: Large)   Pulse 70   Ht 6' 2.02" (1.88 m)   Wt (!) 389 lb (176.4 kg)   SpO2 97%   BMI 49.92 kg/m     Wt Readings from Last 3 Encounters:  07/06/19 (!) 389 lb (176.4 kg)  06/08/19 (!) 380 lb (172.4 kg)  06/01/19 (!) 381 lb (172.8 kg)     GEN: Obese male, well nourished, well developed in no acute distress HEENT: Normal NECK: No JVD; No carotid bruits LYMPHATICS: No lymphadenopathy CARDIAC: S1S2 noted,RRR, no murmurs, rubs, gallops RESPIRATORY:  Clear to auscultation without rales, wheezing or rhonchi  ABDOMEN: Soft, non-tender, non-distended, +bowel sounds, no guarding. EXTREMITIES: Trace bilateral ankle no edema, No cyanosis, no clubbing MUSCULOSKELETAL: No deformity  SKIN: Warm and dry NEUROLOGIC:  Alert and oriented x 3, non-focal PSYCHIATRIC:  Normal affect, good insight  ASSESSMENT:    1. Uncontrolled stage 2 hypertension   2. Morbid obesity ([place])   3. Chronic kidney disease, unspecified CKD stage    PLAN:    1.  Patient reported that he is compliant with his medication.  His blood pressure still elevated today in the office is 180/74mmHG. he has not been able to take his blood pressure at home daily because his blood pressure monitor is at his father's house.  2.  He is currently on the amlodipine 5 mg, losartan 50 mg daily, carvedilol 25 mg daily, Lasix 40 mg daily, spironolactone 12.5 mg daily.  At this time, I am going to increase his spironolactone to 25 mg daily.  Review his kidney function which is creatinine was 1.4 and potassium 4.4 mg.  I will repeat at work today in 1 week after.  He will come to the office for follow-up in 1 month another blood work will be repeated to monitor his creatinine kidney function.  3.  Given his resistant hypertension and he has not had a renal ultrasound therefore I am going to order renal ultrasound today just to rule out renal  artery stenosis.  4.  My counseling today with patient was stress heavily on the fact that medication compliance and diet with decreasing salt is significantly important for keeping the blood pressure to target.  5.  He was encouraged to use his CPAP machine.    6. The patient understands the need to lose weight with diet and exercise. We have discussed specific strategies for this.  The patient is in agreement with the above plan. The patient left the office in stable condition.  The patient will follow up in 1 month.   Medication Adjustments/Labs and Tests Ordered: Current medicines are reviewed at length with the patient today.  Concerns regarding medicines are outlined above.  Orders Placed This Encounter  Procedures  . Basic metabolic panel  . Magnesium  . VAS US RENAL ARTERY DUPLEX   Meds ordered this encounter  Medications  . spironolactone (ALDACTONE) 25 MG tablet    Sig: Take 1 tablet (25 mg total) by mouth daily.    Dispense:  90 tablet    Refill:  1    Patient Instructions  Medication Instructions:  Your physician has recommended you make the following change in your medication:   INCREASE: Spironolactone to 25 mg daily   If you need a refill on your cardiac medications before your next appointment, please call your pharmacy.   Lab work: Your physician recommends that you return for lab work today: bmp, mag   If you have labs (blood work) drawn today and your tests are completely normal, you will receive your results only by: Marland Kitchen MyChart Message (if you have MyChart) OR . A paper copy in the mail If you have any lab test that is abnormal or we need to change your treatment, we will call you to review the results.  Testing/Procedures: Your physician has requested that you have a renal artery duplex. During this test, an ultrasound is used to evaluate blood flow to the kidneys. Allow one hour for this exam. Do not eat after midnight the day before and avoid  carbonated beverages. Take your medications as you usually do.    Follow-Up: . Follow up in 1 month   Any Other Special Instructions Will Be Listed Below (If Applicable).  Adopting a Healthy Lifestyle.  Know what a healthy weight is for you (roughly BMI <25) and aim to maintain this   Aim for 7+ servings of fruits and vegetables daily   65-80+ fluid ounces of water or unsweet tea for healthy kidneys   Limit to max 1 drink of alcohol per day; avoid smoking/tobacco   Limit animal fats in diet for cholesterol and heart health - choose grass fed whenever available   Avoid highly processed foods, and foods high in saturated/trans fats   Aim for low stress - take time to unwind and care for your mental health   Aim for 150 min of moderate intensity exercise weekly for heart health, and weights twice weekly for bone health   Aim for 7-9 hours of sleep daily   When it comes to diets, agreement about the perfect plan isnt easy to find, even among the experts. Experts at the [employer] developed an idea known as the Healthy Eating Plate. Just imagine a plate divided into logical, healthy portions.   The emphasis is on diet quality:   Load up on vegetables and fruits - one-half of your plate: Aim for color and variety, and remember that potatoes dont count.   Go for whole grains - one-quarter of your plate: Whole wheat, barley, wheat berries, quinoa, oats, brown rice, and foods made with them. If you want pasta, go with whole wheat pasta.   Protein power - one-quarter of your plate: Fish, chicken, beans, and nuts are all healthy, versatile protein sources. Limit red meat.   The diet, however, does go beyond the plate, offering a few other suggestions.   Use healthy plant oils, such as olive, canola, soy, corn, sunflower and peanut. Check the labels, and avoid partially hydrogenated oil, which have unhealthy trans fats.   If youre thirsty, drink water.  Coffee and tea are good in moderation, but skip sugary drinks and limit milk and dairy products to one or two daily servings.   The type of carbohydrate in the diet is more important than the amount. Some sources of carbohydrates, such as vegetables, fruits, whole grains, and beans-are healthier than others.   Finally, stay active  Signed, Berniece Salines, DO  07/06/2019 12:05 PM    [place]

## 2019-07-07 LAB — BASIC METABOLIC PANEL
BUN/Creatinine Ratio: 16 (ref 9–20)
BUN: 24 mg/dL — ABNORMAL HIGH (ref 6–20)
CO2: 21 mmol/L (ref 20–29)
Calcium: 8.7 mg/dL (ref 8.7–10.2)
Chloride: 104 mmol/L (ref 96–106)
Creatinine, Ser: 1.54 mg/dL — ABNORMAL HIGH (ref 0.76–1.27)
GFR calc Af Amer: 65 mL/min/{1.73_m2} (ref >59)
GFR calc non Af Amer: 56 mL/min/{1.73_m2} — ABNORMAL LOW (ref >59)
Glucose: 96 mg/dL (ref 65–99)
Potassium: 4.1 mmol/L (ref 3.5–5.2)
Sodium: 138 mmol/L (ref 134–144)

## 2019-07-07 LAB — MAGNESIUM: Magnesium: 2.3 mg/dL (ref 1.6–2.3)

## 2019-07-11 ENCOUNTER — Telehealth: Payer: Self-pay | Admitting: Cardiology

## 2019-07-11 MED ORDER — SPIRONOLACTONE 25 MG PO TABS: 12.5000 mg | ORAL_TABLET | Freq: Every day | ORAL | 1 refills | Status: DC

## 2019-07-11 NOTE — Telephone Encounter (Signed)
Patient states Dr. Harriet Masson put him on a new medicine and he is calling to report it makes him dizzy.

## 2019-07-11 NOTE — Telephone Encounter (Signed)
Patient returned call and explained that after taking his evening dose of coreg 25 mg and eating dinner, he had a dizzy spell. He was sitting down when this happened and it resolved spontaneously. Patient did not check his vital signs during or after the dizzy spell; however, he did check his BP yesterday morning one hour after taking his morning medications and it was 161/82. Patient is taking all of his medications as prescribed. Please advise as patient was instructed to contact our office if he had any symptoms after increasing spironolactone to 25 mg daily during his last office visit on 07/06/2019. Thanks!

## 2019-07-11 NOTE — Telephone Encounter (Signed)
Please have the patient cut back his spironolactone to 12.5 mg daily.  Also please ask him to take his pressure daily.  And I will go over it with him at her next appointment.  Thank you

## 2019-07-11 NOTE — Addendum Note (Signed)
Addended by: Austin Miles on: 07/11/2019 04:03 PM   Modules accepted: Orders

## 2019-07-11 NOTE — Telephone Encounter (Signed)
Patient informed to decrease his dose of spironolactone from 25 mg to 12.5 mg daily and to check his blood pressure daily at the same time every day after taking his morning medications. Patient will keep a log of his BP readings and bring with him to his follow up appointments for Dr. Harriet Masson to review. Patient is agreeable and verbalized understanding. No further questions.

## 2019-07-11 NOTE — Telephone Encounter (Signed)
Left message for patient to return call.  Will continue efforts.  

## 2019-07-12 ENCOUNTER — Encounter: Payer: Self-pay | Admitting: Cardiology

## 2019-07-12 ENCOUNTER — Other Ambulatory Visit: Payer: Self-pay

## 2019-07-12 ENCOUNTER — Ambulatory Visit (INDEPENDENT_AMBULATORY_CARE_PROVIDER_SITE_OTHER): Payer: 59 | Admitting: Cardiology

## 2019-07-12 VITALS — BP 150/90 | HR 64 | Temp 98.3°F | Ht 74.02 in | Wt 380.0 lb

## 2019-07-12 DIAGNOSIS — I10 Essential (primary) hypertension: Secondary | ICD-10-CM

## 2019-07-12 DIAGNOSIS — N189 Chronic kidney disease, unspecified: Secondary | ICD-10-CM

## 2019-07-12 MED ORDER — HYDRALAZINE HCL 25 MG PO TABS: 25.0000 mg | ORAL_TABLET | Freq: Two times a day (BID) | ORAL | 1 refills | Status: DC

## 2019-07-12 MED ORDER — FUROSEMIDE 40 MG PO TABS: 40.0000 mg | ORAL_TABLET | Freq: Every day | ORAL | 0 refills | Status: DC | PRN

## 2019-07-12 NOTE — Progress Notes (Signed)
Cardiology Office Note:    Date:  07/12/2019   ID:  Calvin Bailey, DOB June 28, 1981, MRN [id number]  PCP:  Maximiano Coss, NP  Cardiologist:  Berniece Salines, DO  Electrophysiologist:  None   Referring MD: Maximiano Coss, NP   Chief Complaint  Patient presents with  . Follow-up    History of Present Illness:    Calvin Bailey is a 38 y.o. male with a hx of hypertension,tobacco use, CKD, OSA , dyslipidemia presents today for a follow up.   I did see patient on 06/01/2019 in an initial evaluation. At which time he was on takingcarvedilol 25 mg twice daily, amlodipine 10 mg daily,and Lasix 40mg  daily. During his visit his blood pressure was elevated and he did have bilateral leg edema. He medications were modified: Amlodipine was decreased to 5mg  due to concern for leg edema and Losartan 50mg  was added.   He follow up on 06/08/2019 at which time aldactone 12.5mg  was added to the patient's regimen.  He came for a bp check visit on 07/06/2019 given his elevated blood pressure his aldactone was increased to 25mg  daily.   He is here for a follow up visit and states that since the increase of his aldactone he has been dizzy.   No other complaints at this time.  Past Medical History:  Diagnosis Date  . GERD (gastroesophageal reflux disease)   . HTN (hypertension)   . Obesity hypoventilation syndrome ([place]) 04/08/2014  . OSA (obstructive sleep apnea) 04/08/2014  . Sleep apnea     Past Surgical History:  Procedure Laterality Date  . two knee surgeries Left 1995    Current Medications: Current Meds  Medication Sig  . amLODipine (NORVASC) 5 MG tablet Take 1 tablet (5 mg total) by mouth daily.  . carvedilol (COREG) 25 MG tablet Take 1 tablet (25 mg total) by mouth 2 (two) times daily with a meal.  . furosemide (LASIX) 40 MG tablet Take 1 tablet (40 mg total) by mouth daily as needed (for weight gain of 3lbs in 1 day).  Marland Kitchen losartan (COZAAR) 50 MG tablet Take 1 tablet (50 mg total) by  mouth daily.  Marland Kitchen spironolactone (ALDACTONE) 25 MG tablet Take 0.5 tablets (12.5 mg total) by mouth daily.  . [DISCONTINUED] furosemide (LASIX) 40 MG tablet Take 1 tablet (40 mg total) by mouth daily.     Allergies:   Chlorthalidone and Lisinopril   Social History   Socioeconomic History  . Marital status: Single    Spouse name: Not on file  . Number of children: 1  . Years of education: college  . Highest education level: Not on file  Occupational History  . Occupation: unemployment    Fish farm manager: UNEMPLOYED    Employer: [employer]  Social Needs  . Financial resource strain: Not on file  . Food insecurity    Worry: Not on file    Inability: Not on file  . Transportation needs    Medical: Not on file    Non-medical: Not on file  Tobacco Use  . Smoking status: Current Every Day Smoker    Packs/day: 1.00    Years: 16.00    Pack years: 16.00    Types: Cigarettes  . Smokeless tobacco: Never Used  Substance and Sexual Activity  . Alcohol use: Yes    Alcohol/week: 0.0 standard drinks    Comment: sparingly   . Drug use: No  . Sexual activity: Yes    Partners: Female  Lifestyle  . Physical  activity    Days per week: Not on file    Minutes per session: Not on file  . Stress: Not on file  Relationships  . Social Herbalist on phone: Not on file    Gets together: Not on file    Attends religious service: Not on file    Active member of club or organization: Not on file    Attends meetings of clubs or organizations: Not on file    Relationship status: Not on file  Other Topics Concern  . 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   Patient has one child.   Patient works at [employer].   Patient has a college education.   Patient is left-handed.   Patient does not drink any caffeine.     Family History: The patient's family history includes CVA in his maternal uncle; Diabetes in his maternal  grandmother; Heart failure in his maternal grandfather and paternal grandfather; Hyperlipidemia in his paternal grandfather; Hypertension in his brother, father, and mother; Multiple sclerosis in his mother.  ROS:   Review of Systems  Constitution: Negative for decreased appetite, fever and weight gain.  HENT: Negative for congestion, ear discharge, hoarse voice and sore throat.   Eyes: Negative for discharge, redness, vision loss in right eye and visual halos.  Cardiovascular: Negative for chest pain, dyspnea on exertion, leg swelling, orthopnea and palpitations.  Respiratory: Negative for cough, hemoptysis, shortness of breath and snoring.   Endocrine: Negative for heat intolerance and polyphagia.  Hematologic/Lymphatic: Negative for bleeding problem. Does not bruise/bleed easily.  Skin: Negative for flushing, nail changes, rash and suspicious lesions.  Musculoskeletal: Negative for arthritis, joint pain, muscle cramps, myalgias, neck pain and stiffness.  Gastrointestinal: Negative for abdominal pain, bowel incontinence, diarrhea and excessive appetite.  Genitourinary: Negative for decreased libido, genital sores and incomplete emptying.  Neurological: Negative for brief paralysis, focal weakness, headaches and loss of balance.  Psychiatric/Behavioral: Negative for altered mental status, depression and suicidal ideas.  Allergic/Immunologic: Negative for HIV exposure and persistent infections.    EKGs/Labs/Other Studies Reviewed:    The following studies were reviewed today:   EKG:  The ekg ordered today demonstrates   Recent Labs: 05/30/2019: ALT 29; BNP 9.8; Hemoglobin 15.9; Platelets 230; TSH 1.210 07/06/2019: BUN 24; Creatinine, Ser 1.54; Magnesium 2.3; Potassium 4.1; Sodium 138  Recent Lipid Panel    Component Value Date/Time   CHOL 149 05/30/2019 1500   TRIG 120 05/30/2019 1500   HDL 34 (L) 05/30/2019 1500   CHOLHDL 4.4 05/30/2019 1500   CHOLHDL 5.1 02/23/2011 1223   VLDL 34  02/23/2011 1223   LDLCALC 93 05/30/2019 1500    Physical Exam:    VS:  BP (!) 150/90 (BP Location: Right Arm, Patient Position: Sitting, Cuff Size: Large)   Pulse 64   Temp 98.3 F (36.8 C)   Ht 6' 2.02" (1.88 m)   Wt (!) 380 lb (172.4 kg)   SpO2 97%   BMI 48.76 kg/m     Wt Readings from Last 3 Encounters:  07/12/19 (!) 380 lb (172.4 kg)  07/06/19 (!) 389 lb (176.4 kg)  06/08/19 (!) 380 lb (172.4 kg)     GEN: Well nourished, well developed in no acute distress HEENT: Normal NECK: No JVD; No carotid bruits LYMPHATICS: No lymphadenopathy CARDIAC: S1S2 noted,RRR, no murmurs, rubs, gallops RESPIRATORY:  Clear to auscultation without rales, wheezing or rhonchi  ABDOMEN: Soft, non-tender, non-distended, +bowel sounds, no guarding. EXTREMITIES: No edema, No cyanosis, no clubbing MUSCULOSKELETAL:  No edema; No deformity  SKIN: Warm and dry NEUROLOGIC:  Alert and oriented x 3, non-focal PSYCHIATRIC:  Normal affect, good insight  ASSESSMENT:    1. Chronic kidney disease, unspecified CKD stage   2. Essential hypertension   3. Morbid obesity ([place])    PLAN:     1. Given his dizziness, I am concerned that this may be due to using 2 diuretics. I will stop the lasix 40mg  daily to an as needed basis. His aldactone will be decreased back to 12.5mg .  I will add hydralazine 25mg  BID.  I did congratulate the patient in office today. Because I do see an improvement in his bp and he is adopting a healthier lifestyle.  The patient understands the need to lose weight with diet and exercise. We have discussed specific strategies for this.  The patient is in agreement with the above plan. The patient left the office in stable condition.  The patient will follow up as schedule.    Medication Adjustments/Labs and Tests Ordered: Current medicines are reviewed at length with the patient today.  Concerns regarding medicines are outlined above.  Orders Placed This Encounter  Procedures  .  Basic Metabolic Panel (BMET)   Meds ordered this encounter  Medications  . furosemide (LASIX) 40 MG tablet    Sig: Take 1 tablet (40 mg total) by mouth daily as needed (for weight gain of 3lbs in 1 day).    Dispense:  90 tablet    Refill:  0  . hydrALAZINE (APRESOLINE) 25 MG tablet    Sig: Take 1 tablet (25 mg total) by mouth 2 (two) times daily.    Dispense:  180 tablet    Refill:  1    Patient Instructions  Medication Instructions:  Your physician has recommended you make the following change in your medication:   CHANGE Furosemide (Lasix) to take 40mg  (one tablet) as needed for weight gain of 3lbs in one day.   START Hydralazine 25mg  twice daily.   Be sure you are taking only a half tablet (12.5mg ) of Spironolactone daily.   *If you need a refill on your cardiac medications before your next appointment, please call your pharmacy*  Lab Work: Your physician recommends that you return for lab work today: BMP.   If you have labs (blood work) drawn today and your tests are completely normal, you will receive your results only by: Marland Kitchen MyChart Message (if you have MyChart) OR . A paper copy in the mail If you have any lab test that is abnormal or we need to change your treatment, we will call you to review the results.  Testing/Procedures: None ordered today.  Follow-Up: At [facility], you and your health needs are our priority.  As part of our continuing mission to provide you with exceptional heart care, we have created designated Provider Care Teams.  These Care Teams include your primary Cardiologist (physician) and Advanced Practice Providers (APPs -  Physician Assistants and Nurse Practitioners) who all work together to provide you with the care you need, when you need it.  Your next appointment:   1 month  The format for your next appointment:   In Person  Provider:   Berniece Salines, DO  Other Instructions  Continue to check your blood pressure regularly and keep  a log.    Hydralazine tablets What is this medicine? HYDRALAZINE (hye DRAL a  zeen) is a type of vasodilator. It relaxes blood vessels, increasing the blood and oxygen supply to your heart. This medicine is used to treat high blood pressure. This medicine may be used for other purposes; ask your health care provider or pharmacist if you have questions. COMMON BRAND NAME(S): Apresoline What should I tell my health care provider before I take this medicine? They need to know if you have any of these conditions:  blood vessel disease  heart disease including angina or history of heart attack  kidney or liver disease  systemic lupus erythematosus (SLE)  an unusual or allergic reaction to hydralazine, tartrazine dye, other medicines, foods, dyes, or preservatives  pregnant or trying to get pregnant  breast-feeding How should I use this medicine? Take this medicine by mouth with a glass of water. Follow the directions on the prescription label. Take your doses at regular intervals. Do not take your medicine more often than directed. Do not stop taking except on the advice of your doctor or health care professional. Talk to your pediatrician regarding the use of this medicine in children. Special care may be needed. While this drug may be prescribed for children for selected conditions, precautions do apply. Overdosage: If you think you have taken too much of this medicine contact a poison control center or emergency room at once. NOTE: This medicine is only for you. Do not share this medicine with others. What if I miss a dose? If you miss a dose, take it as soon as you can. If it is almost time for your next dose, take only that dose. Do not take double or extra doses. What may interact with this medicine?  medicines for high blood pressure  medicines for mental depression This list may not describe all possible interactions. Give your health care provider a list of all the medicines,  herbs, non-prescription drugs, or dietary supplements you use. Also tell them if you smoke, drink alcohol, or use illegal drugs. Some items may interact with your medicine. What should I watch for while using this medicine? Visit your doctor or health care professional for regular checks on your progress. Check your blood pressure and pulse rate regularly. Ask your doctor or health care professional what your blood pressure and pulse rate should be and when you should contact him or her. You may get drowsy or dizzy. Do not drive, use machinery, or do anything that needs mental alertness until you know how this medicine affects you. Do not stand or sit up quickly, especially if you are an older patient. This reduces the risk of dizzy or fainting spells. Alcohol may interfere with the effect of this medicine. Avoid alcoholic drinks. Do not treat yourself for coughs, colds, or pain while you are taking this medicine without asking your doctor or health care professional for advice. Some ingredients may increase your blood pressure. What side effects may I notice from receiving this medicine? Side effects that you should report to your doctor or health care professional as soon as possible:  chest pain, or fast or irregular heartbeat  fever, chills, or sore throat  numbness or tingling in the hands or feet  shortness of breath  skin rash, redness, blisters or itching  stiff or swollen joints  sudden weight gain  swelling of the feet or legs  swollen lymph glands  unusual weakness Side effects that usually do not require medical attention (report to your doctor or health care professional if they continue or are bothersome):  diarrhea, or constipation  headache  loss of appetite  nausea, vomiting This list may not describe all possible side effects. Call your doctor for medical advice about side effects. You may report side effects to FDA at 1-800-FDA-1088. Where should I keep my  medicine? Keep out of the reach of children. Store at room temperature between 15 and 30 degrees C (59 and 86 degrees F). Throw away any unused medicine after the expiration date. NOTE: This sheet is a summary. It may not cover all possible information. If you have questions about this medicine, talk to your doctor, pharmacist, or health care provider.  2020 Elsevier/Gold Standard (2008-01-26 15:44:58)     Adopting a Healthy Lifestyle.  Know what a healthy weight is for you (roughly BMI <25) and aim to maintain this   Aim for 7+ servings of fruits and vegetables daily   65-80+ fluid ounces of water or unsweet tea for healthy kidneys   Limit to max 1 drink of alcohol per day; avoid smoking/tobacco   Limit animal fats in diet for cholesterol and heart health - choose grass fed whenever available   Avoid highly processed foods, and foods high in saturated/trans fats   Aim for low stress - take time to unwind and care for your mental health   Aim for 150 min of moderate intensity exercise weekly for heart health, and weights twice weekly for bone health   Aim for 7-9 hours of sleep daily   When it comes to diets, agreement about the perfect plan isnt easy to find, even among the experts. Experts at the [employer] developed an idea known as the Healthy Eating Plate. Just imagine a plate divided into logical, healthy portions.   The emphasis is on diet quality:   Load up on vegetables and fruits - one-half of your plate: Aim for color and variety, and remember that potatoes dont count.   Go for whole grains - one-quarter of your plate: Whole wheat, barley, wheat berries, quinoa, oats, brown rice, and foods made with them. If you want pasta, go with whole wheat pasta.   Protein power - one-quarter of your plate: Fish, chicken, beans, and nuts are all healthy, versatile protein sources. Limit red meat.   The diet, however, does go beyond the plate, offering a  few other suggestions.   Use healthy plant oils, such as olive, canola, soy, corn, sunflower and peanut. Check the labels, and avoid partially hydrogenated oil, which have unhealthy trans fats.   If youre thirsty, drink water. Coffee and tea are good in moderation, but skip sugary drinks and limit milk and dairy products to one or two daily servings.   The type of carbohydrate in the diet is more important than the amount. Some sources of carbohydrates, such as vegetables, fruits, whole grains, and beans-are healthier than others.   Finally, stay active  Signed, Berniece Salines, DO  07/12/2019 11:53 PM    [facility]

## 2019-07-12 NOTE — Patient Instructions (Addendum)
Medication Instructions:  Your physician has recommended you make the following change in your medication:   CHANGE Furosemide (Lasix) to take 40mg  (one tablet) as needed for weight gain of 3lbs in one day.   START Hydralazine 25mg  twice daily.   Be sure you are taking only a half tablet (12.5mg ) of Spironolactone daily.   *If you need a refill on your cardiac medications before your next appointment, please call your pharmacy*  Lab Work: Your physician recommends that you return for lab work today: BMP.   If you have labs (blood work) drawn today and your tests are completely normal, you will receive your results only by: Marland Kitchen MyChart Message (if you have MyChart) OR . A paper copy in the mail If you have any lab test that is abnormal or we need to change your treatment, we will call you to review the results.  Testing/Procedures: None ordered today.  Follow-Up: At [facility], you and your health needs are our priority.  As part of our continuing mission to provide you with exceptional heart care, we have created designated Provider Care Teams.  These Care Teams include your primary Cardiologist (physician) and Advanced Practice Providers (APPs -  Physician Assistants and Nurse Practitioners) who all work together to provide you with the care you need, when you need it.  Your next appointment:   1 month  The format for your next appointment:   In Person  Provider:   Berniece Salines, DO  Other Instructions  Continue to check your blood pressure regularly and keep a log.    Hydralazine tablets What is this medicine? HYDRALAZINE (hye DRAL a zeen) is a type of vasodilator. It relaxes blood vessels, increasing the blood and oxygen supply to your heart. This medicine is used to treat high blood pressure. This medicine may be used for other purposes; ask your health care provider or pharmacist if you have questions. COMMON BRAND NAME(S): Apresoline What should I tell my health care  provider before I take this medicine? They need to know if you have any of these conditions:  blood vessel disease  heart disease including angina or history of heart attack  kidney or liver disease  systemic lupus erythematosus (SLE)  an unusual or allergic reaction to hydralazine, tartrazine dye, other medicines, foods, dyes, or preservatives  pregnant or trying to get pregnant  breast-feeding How should I use this medicine? Take this medicine by mouth with a glass of water. Follow the directions on the prescription label. Take your doses at regular intervals. Do not take your medicine more often than directed. Do not stop taking except on the advice of your doctor or health care professional. Talk to your pediatrician regarding the use of this medicine in children. Special care may be needed. While this drug may be prescribed for children for selected conditions, precautions do apply. Overdosage: If you think you have taken too much of this medicine contact a poison control center or emergency room at once. NOTE: This medicine is only for you. Do not share this medicine with others. What if I miss a dose? If you miss a dose, take it as soon as you can. If it is almost time for your next dose, take only that dose. Do not take double or extra doses. What may interact with this medicine?  medicines for high blood pressure  medicines for mental depression This list may not describe all possible interactions. Give your health care provider a list of all the  medicines, herbs, non-prescription drugs, or dietary supplements you use. Also tell them if you smoke, drink alcohol, or use illegal drugs. Some items may interact with your medicine. What should I watch for while using this medicine? Visit your doctor or health care professional for regular checks on your progress. Check your blood pressure and pulse rate regularly. Ask your doctor or health care professional what your blood pressure  and pulse rate should be and when you should contact him or her. You may get drowsy or dizzy. Do not drive, use machinery, or do anything that needs mental alertness until you know how this medicine affects you. Do not stand or sit up quickly, especially if you are an older patient. This reduces the risk of dizzy or fainting spells. Alcohol may interfere with the effect of this medicine. Avoid alcoholic drinks. Do not treat yourself for coughs, colds, or pain while you are taking this medicine without asking your doctor or health care professional for advice. Some ingredients may increase your blood pressure. What side effects may I notice from receiving this medicine? Side effects that you should report to your doctor or health care professional as soon as possible:  chest pain, or fast or irregular heartbeat  fever, chills, or sore throat  numbness or tingling in the hands or feet  shortness of breath  skin rash, redness, blisters or itching  stiff or swollen joints  sudden weight gain  swelling of the feet or legs  swollen lymph glands  unusual weakness Side effects that usually do not require medical attention (report to your doctor or health care professional if they continue or are bothersome):  diarrhea, or constipation  headache  loss of appetite  nausea, vomiting This list may not describe all possible side effects. Call your doctor for medical advice about side effects. You may report side effects to FDA at 1-800-FDA-1088. Where should I keep my medicine? Keep out of the reach of children. Store at room temperature between 15 and 30 degrees C (59 and 86 degrees F). Throw away any unused medicine after the expiration date. NOTE: This sheet is a summary. It may not cover all possible information. If you have questions about this medicine, talk to your doctor, pharmacist, or health care provider.  2020 Elsevier/Gold Standard (2008-01-26 15:44:58)

## 2019-07-13 LAB — BASIC METABOLIC PANEL
BUN/Creatinine Ratio: 15 (ref 9–20)
BUN: 23 mg/dL — ABNORMAL HIGH (ref 6–20)
CO2: 22 mmol/L (ref 20–29)
Calcium: 9.1 mg/dL (ref 8.7–10.2)
Chloride: 103 mmol/L (ref 96–106)
Creatinine, Ser: 1.53 mg/dL — ABNORMAL HIGH (ref 0.76–1.27)
GFR calc Af Amer: 65 mL/min/{1.73_m2} (ref >59)
GFR calc non Af Amer: 56 mL/min/{1.73_m2} — ABNORMAL LOW (ref >59)
Glucose: 85 mg/dL (ref 65–99)
Potassium: 4.6 mmol/L (ref 3.5–5.2)
Sodium: 137 mmol/L (ref 134–144)

## 2019-07-18 ENCOUNTER — Other Ambulatory Visit: Payer: Self-pay

## 2019-07-18 ENCOUNTER — Ambulatory Visit (HOSPITAL_BASED_OUTPATIENT_CLINIC_OR_DEPARTMENT_OTHER)
Admission: RE | Admit: 2019-07-18 | Discharge: 2019-07-18 | Disposition: A | Discharge status: Home / Self Care | Payer: 59 | Source: Ambulatory Visit | Attending: Cardiology | Admitting: Cardiology

## 2019-07-18 DIAGNOSIS — I10 Essential (primary) hypertension: Secondary | ICD-10-CM

## 2019-07-18 NOTE — Progress Notes (Signed)
Renal doppler performed   07/18/19 Cardell Peach RDCS, RVT

## 2019-07-19 ENCOUNTER — Encounter: Payer: Self-pay | Admitting: Registered Nurse

## 2019-07-19 ENCOUNTER — Encounter: Payer: Self-pay | Admitting: *Deleted

## 2019-07-31 ENCOUNTER — Ambulatory Visit: Payer: 59 | Admitting: Registered Nurse

## 2019-07-31 ENCOUNTER — Other Ambulatory Visit: Payer: Self-pay

## 2019-07-31 ENCOUNTER — Encounter: Payer: Self-pay | Admitting: Registered Nurse

## 2019-07-31 VITALS — BP 155/94 | HR 66 | Temp 98.7°F | Resp 16 | Ht 74.0 in | Wt 380.0 lb

## 2019-07-31 DIAGNOSIS — L608 Other nail disorders: Secondary | ICD-10-CM | POA: Diagnosis not present

## 2019-07-31 MED ORDER — CIPROFLOXACIN HCL 500 MG PO TABS: 500.0000 mg | ORAL_TABLET | Freq: Two times a day (BID) | ORAL | 0 refills | Status: DC

## 2019-07-31 NOTE — Patient Instructions (Signed)
° ° ° °  If you have lab work done today you will be contacted with your lab results within the next 2 weeks.  If you have not heard from us then please contact us. The fastest way to get your results is to register for My Chart. ° ° °IF you received an x-ray today, you will receive an invoice from [facility]. Please contact [facility] at [phone] with questions or concerns regarding your invoice.  ° °IF you received labwork today, you will receive an invoice from [facility]. Please contact [facility] at [phone] with questions or concerns regarding your invoice.  ° °Our billing staff will not be able to assist you with questions regarding bills from these companies. ° °You will be contacted with the lab results as soon as they are available. The fastest way to get your results is to activate your My Chart account. Instructions are located on the last page of this paperwork. If you have not heard from us regarding the results in 2 weeks, please contact this office. °  ° ° ° °

## 2019-07-31 NOTE — Progress Notes (Signed)
p   Acute Office Visit  Subjective:    Patient ID: Calvin Bailey, male    DOB: 10/13/1980, 38 y.o.   MRN: [id number]  Chief Complaint  Patient presents with  . Finger Injury    index finger painful x 3 weeks right hand     HPI Patient is in today for painful nail on index finger of R hand. Notes that is has been going on for three weeks. It has worsened. Some swelling and green discoloration of the nail towards the lateral and distal corner. No discharge, skin around the finger tip is mildly swollen and tender. No changes to ROM. No streaking, redness, or signs of infection beyond distal end of finger. No NVD, headache, fever, chills.  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 Fingers are frequently wet.   Past Medical History:  Diagnosis Date  . GERD (gastroesophageal reflux disease)   . HTN (hypertension)   . Obesity hypoventilation syndrome ([place]) 04/08/2014  . OSA (obstructive sleep apnea) 04/08/2014  . Sleep apnea     Past Surgical History:  Procedure Laterality Date  . two knee surgeries Left 1995    Family History  Problem Relation Age of Onset  . Hypertension Mother   . Multiple sclerosis Mother   . Hypertension Father   . Hypertension Brother   . Hyperlipidemia Paternal Grandfather   . Heart failure Paternal Grandfather   . Diabetes Maternal Grandmother   . Heart failure Maternal Grandfather   . CVA Maternal Uncle     Social History   Socioeconomic History  . Marital status: Single    Spouse name: Not on file  . Number of children: 1  . Years of education: college  . Highest education level: Not on file  Occupational History  . Occupation: unemployment    Fish farm manager: UNEMPLOYED    Employer: [employer]  Social Needs  . Financial resource strain: Not on file  . Food insecurity    Worry: Not on file    Inability: Not on file  . Transportation needs    Medical: Not on file    Non-medical: Not on file  Tobacco Use  . Smoking  status: Current Every Day Smoker    Packs/day: 1.00    Years: 16.00    Pack years: 16.00    Types: Cigarettes  . Smokeless tobacco: Never Used  Substance and Sexual Activity  . Alcohol use: Yes    Alcohol/week: 0.0 standard drinks    Comment: sparingly   . Drug use: No  . Sexual activity: Yes    Partners: Female  Lifestyle  . Physical activity    Days per week: Not on file    Minutes per session: Not on file  . Stress: Not on file  Relationships  . Social Herbalist on phone: Not on file    Gets together: Not on file    Attends religious service: Not on file    Active member of club or organization: Not on file    Attends meetings of clubs or organizations: Not on file    Relationship status: Not on file  . Intimate partner violence    Fear of current or ex partner: Not on file    Emotionally abused: Not on file    Physically abused: Not on file    Forced sexual activity: Not on file  Other Topics Concern  . 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   Patient has one child.   Patient works at [employer].   Patient has a college education.   Patient is left-handed.   Patient does not drink any caffeine.    Outpatient Medications Prior to Visit  Medication Sig Dispense Refill  . amLODipine (NORVASC) 5 MG tablet Take 1 tablet (5 mg total) by mouth daily. 30 tablet 2  . carvedilol (COREG) 25 MG tablet Take 1 tablet (25 mg total) by mouth 2 (two) times daily with a meal. 180 tablet 1  . furosemide (LASIX) 40 MG tablet Take 1 tablet (40 mg total) by mouth daily as needed (for weight gain of 3lbs in 1 day). 90 tablet 0  . hydrALAZINE (APRESOLINE) 25 MG tablet Take 1 tablet (25 mg total) by mouth 2 (two) times daily. 180 tablet 1  . losartan (COZAAR) 50 MG tablet Take 1 tablet (50 mg total) by mouth daily. 30 tablet 2  . spironolactone (ALDACTONE) 25 MG tablet Take 0.5 tablets (12.5 mg total) by mouth  daily. 90 tablet 1   No facility-administered medications prior to visit.     Allergies  Allergen Reactions  . Chlorthalidone Nausea Only  . Lisinopril Cough    Review of Systems  Constitutional: Negative.   HENT: Negative.   Eyes: Negative.   Respiratory: Negative.   Cardiovascular: Negative.   Gastrointestinal: Negative.   Genitourinary: Negative.   Musculoskeletal: Negative.   Skin: Negative.        Per hpi  Neurological: Negative.   Endo/Heme/Allergies: Negative.   Psychiatric/Behavioral: Negative.   All other systems reviewed and are negative.      Objective:    Physical Exam  Constitutional: He is oriented to person, place, and time. He appears well-developed and well-nourished.  Cardiovascular: Normal rate and regular rhythm.  Pulmonary/Chest: Effort normal. No respiratory distress.  Neurological: He is oriented to person, place, and time.  Skin: Skin is warm and dry. No rash noted. No erythema. No pallor.     Psychiatric: He has a normal mood and affect. His behavior is normal. Judgment and thought content normal.  Nursing note and vitals reviewed.   BP (!) 155/94   Pulse 66   Temp 98.7 F (37.1 C) (Oral)   Resp 16   Ht 6\' 2"  (1.88 m)   Wt (!) 380 lb (172.4 kg)   SpO2 96%   BMI 48.79 kg/m  Wt Readings from Last 3 Encounters:  07/31/19 (!) 380 lb (172.4 kg)  07/12/19 (!) 380 lb (172.4 kg)  07/06/19 (!) 389 lb (176.4 kg)    There are no preventive care reminders to display for this patient.  There are no preventive care reminders to display for this patient.   Lab Results  Component Value Date   TSH 1.210 05/30/2019   Lab Results  Component Value Date   WBC 7.2 05/30/2019   HGB 15.9 05/30/2019   HCT 47.5 05/30/2019   MCV 88 05/30/2019   PLT 230 05/30/2019   Lab Results  Component Value Date   NA 137 07/12/2019   K 4.6 07/12/2019   CO2 22 07/12/2019   GLUCOSE 85 07/12/2019   BUN 23 (H) 07/12/2019   CREATININE 1.53 (H) 07/12/2019    BILITOT 0.4 05/30/2019   ALKPHOS 100 05/30/2019   AST 17 05/30/2019   ALT 29 05/30/2019   PROT 7.8 05/30/2019   ALBUMIN 4.7 05/30/2019   CALCIUM 9.1 07/12/2019   ANIONGAP 9 07/02/2017  Lab Results  Component Value Date   CHOL 149 05/30/2019   Lab Results  Component Value Date   HDL 34 (L) 05/30/2019   Lab Results  Component Value Date   LDLCALC 93 05/30/2019   Lab Results  Component Value Date   TRIG 120 05/30/2019   Lab Results  Component Value Date   CHOLHDL 4.4 05/30/2019   Lab Results  Component Value Date   HGBA1C 5.2 05/30/2019       Assessment & Plan:   Problem List Items Addressed This Visit    None    Visit Diagnoses    Green nails    -  Primary   Relevant Medications   ciprofloxacin (CIPRO) 500 MG tablet       Meds ordered this encounter  Medications  . ciprofloxacin (CIPRO) 500 MG tablet    Sig: Take 1 tablet (500 mg total) by mouth 2 (two) times daily.    Dispense:  14 tablet    Refill:  0    Order Specific Question:   Supervising Provider    Answer:   Forrest Moron [id number]   PLAN  Green nail syndrome - feel this is likely a pseudomonas infection given his occupation and presentation.   Discussed this case with Dr. Mitchel Honour. He and I agree that a one week course of ciprofloxacin 500mg  PO bid would be best with follow up in one week to monitor progression  Patient encouraged to call clinic with any questions, comments, or concerns.    Maximiano Coss, NP

## 2019-08-06 ENCOUNTER — Ambulatory Visit: Payer: 59 | Admitting: Cardiology

## 2019-08-07 ENCOUNTER — Ambulatory Visit: Payer: 59 | Admitting: Registered Nurse

## 2019-08-08 ENCOUNTER — Encounter: Payer: Self-pay | Admitting: Registered Nurse

## 2019-08-10 ENCOUNTER — Ambulatory Visit (INDEPENDENT_AMBULATORY_CARE_PROVIDER_SITE_OTHER): Payer: 59 | Admitting: Cardiology

## 2019-08-10 ENCOUNTER — Encounter: Payer: Self-pay | Admitting: Cardiology

## 2019-08-10 ENCOUNTER — Ambulatory Visit: Payer: 59 | Admitting: Cardiology

## 2019-08-10 ENCOUNTER — Other Ambulatory Visit: Payer: Self-pay

## 2019-08-10 VITALS — BP 154/98 | HR 67 | Ht 74.0 in | Wt 387.0 lb

## 2019-08-10 DIAGNOSIS — I10 Essential (primary) hypertension: Secondary | ICD-10-CM | POA: Diagnosis not present

## 2019-08-10 MED ORDER — LABETALOL HCL 200 MG PO TABS: 200.0000 mg | ORAL_TABLET | Freq: Two times a day (BID) | ORAL | 1 refills | Status: DC

## 2019-08-10 NOTE — Progress Notes (Signed)
Cardiology Office Note:    Date:  08/10/2019   ID:  Calvin Bailey, DOB 12-30-1980, MRN [id number]  PCP:  Maximiano Coss, NP  Cardiologist:  Berniece Salines, DO  Electrophysiologist:  None   Referring MD: Maximiano Coss, NP   Chief Complaint  Patient presents with  . Follow-up    History of Present Illness:   Calvin Bailey a 38 y.o.malewith a hx of hypertension,tobacco use, CKD, OSA , dyslipidemia presents today for a follow up.   I did see patient on 06/01/2019 in an initial evaluation. At which time he was ontakingcarvedilol 25 mg twice daily, amlodipine 10 mg daily,and Lasix 40mg  daily. During his visit his blood pressure was elevated and he did have bilateral leg edema. He medications were modified: Amlodipine was decreased to 5mg  due to concern for leg edema and Losartan 50mg  was added.   He follow up on 06/08/2019 at which time aldactone 12.5mg  was added to the patient's regimen.  He came for a bp check visit on 07/06/2019 given his elevated blood pressure his aldactone was increased to 25mg  daily  On July 12, 2019 during that visit hydralazine 5 mg twice a day was added and he was advised that it was okay to take his Lasix on a as needed basis.  Today he is here for follow-up visit and tells me that he has run out of his carvedilol 25 mg twice daily.   Past Medical History:  Diagnosis Date  . GERD (gastroesophageal reflux disease)   . HTN (hypertension)   . Obesity hypoventilation syndrome ([place]) 04/08/2014  . OSA (obstructive sleep apnea) 04/08/2014  . Sleep apnea     Past Surgical History:  Procedure Laterality Date  . two knee surgeries Left 1995    Current Medications: Current Meds  Medication Sig  . amLODipine (NORVASC) 5 MG tablet Take 1 tablet (5 mg total) by mouth daily.  . ciprofloxacin (CIPRO) 500 MG tablet Take 1 tablet (500 mg total) by mouth 2 (two) times daily.  . furosemide (LASIX) 40 MG tablet Take 1 tablet (40 mg total) by mouth daily  as needed (for weight gain of 3lbs in 1 day).  . hydrALAZINE (APRESOLINE) 25 MG tablet Take 1 tablet (25 mg total) by mouth 2 (two) times daily.  Marland Kitchen losartan (COZAAR) 50 MG tablet Take 1 tablet (50 mg total) by mouth daily.  Marland Kitchen spironolactone (ALDACTONE) 25 MG tablet Take 0.5 tablets (12.5 mg total) by mouth daily.  . [DISCONTINUED] carvedilol (COREG) 25 MG tablet Take 1 tablet (25 mg total) by mouth 2 (two) times daily with a meal.     Allergies:   Chlorthalidone and Lisinopril   Social History   Socioeconomic History  . Marital status: Single    Spouse name: Not on file  . Number of children: 1  . Years of education: college  . Highest education level: Not on file  Occupational History  . Occupation: unemployment    Fish farm manager: UNEMPLOYED    Employer: [employer]  Social Needs  . Financial resource strain: Not on file  . Food insecurity    Worry: Not on file    Inability: Not on file  . Transportation needs    Medical: Not on file    Non-medical: Not on file  Tobacco Use  . Smoking status: Current Every Day Smoker    Packs/day: 1.00    Years: 16.00    Pack years: 16.00    Types: Cigarettes  . Smokeless tobacco: Never Used  Substance and Sexual Activity  . Alcohol use: Yes    Alcohol/week: 0.0 standard drinks    Comment: sparingly   . Drug use: No  . Sexual activity: Yes    Partners: Female  Lifestyle  . Physical activity    Days per week: Not on file    Minutes per session: Not on file  . Stress: Not on file  Relationships  . Social Herbalist on phone: Not on file    Gets together: Not on file    Attends religious service: Not on file    Active member of club or organization: Not on file    Attends meetings of clubs or organizations: Not on file    Relationship status: Not on file  Other Topics Concern  . 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   Patient has one child.   Patient works  at [employer].   Patient has a college education.   Patient is left-handed.   Patient does not drink any caffeine.     Family History: The patient's family history includes CVA in his maternal uncle; Diabetes in his maternal grandmother; Heart failure in his maternal grandfather and paternal grandfather; Hyperlipidemia in his paternal grandfather; Hypertension in his brother, father, and mother; Multiple sclerosis in his mother.  ROS:   Review of Systems  Constitution: Negative for decreased appetite, fever and weight gain.  HENT: Negative for congestion, ear discharge, hoarse voice and sore throat.   Eyes: Negative for discharge, redness, vision loss in right eye and visual halos.  Cardiovascular: Negative for chest pain, dyspnea on exertion, leg swelling, orthopnea and palpitations.  Respiratory: Negative for cough, hemoptysis, shortness of breath and snoring.   Endocrine: Negative for heat intolerance and polyphagia.  Hematologic/Lymphatic: Negative for bleeding problem. Does not bruise/bleed easily.  Skin: Negative for flushing, nail changes, rash and suspicious lesions.  Musculoskeletal: Negative for arthritis, joint pain, muscle cramps, myalgias, neck pain and stiffness.  Gastrointestinal: Negative for abdominal pain, bowel incontinence, diarrhea and excessive appetite.  Genitourinary: Negative for decreased libido, genital sores and incomplete emptying.  Neurological: Negative for brief paralysis, focal weakness, headaches and loss of balance.  Psychiatric/Behavioral: Negative for altered mental status, depression and suicidal ideas.  Allergic/Immunologic: Negative for HIV exposure and persistent infections.    EKGs/Labs/Other Studies Reviewed:    The following studies were reviewed today:   EKG:  None today.  Recent Labs: 05/30/2019: ALT 29; BNP 9.8; Hemoglobin 15.9; Platelets 230; TSH 1.210 07/06/2019: Magnesium 2.3 07/12/2019: BUN 23; Creatinine, Ser 1.53; Potassium  4.6; Sodium 137  Recent Lipid Panel    Component Value Date/Time   CHOL 149 05/30/2019 1500   TRIG 120 05/30/2019 1500   HDL 34 (L) 05/30/2019 1500   CHOLHDL 4.4 05/30/2019 1500   CHOLHDL 5.1 02/23/2011 1223   VLDL 34 02/23/2011 1223   LDLCALC 93 05/30/2019 1500    Physical Exam:    VS:  BP (!) 154/98 (BP Location: Left Arm, Patient Position: Sitting, Cuff Size: Large)   Pulse 67   Ht 6\' 2"  (1.88 m)   Wt (!) 387 lb (175.5 kg)   SpO2 95%   BMI 49.69 kg/m     Wt Readings from Last 3 Encounters:  08/10/19 (!) 387 lb (175.5 kg)  07/31/19 (!) 380 lb (172.4 kg)  07/12/19 (!) 380 lb (172.4 kg)     GEN: Well nourished, obese male, well  developed in no acute distress HEENT: Normal NECK: No JVD; No carotid bruits LYMPHATICS: No lymphadenopathy CARDIAC: S1S2 noted,RRR, no murmurs, rubs, gallops RESPIRATORY:  Clear to auscultation without rales, wheezing or rhonchi  ABDOMEN: Soft, non-tender, non-distended, +bowel sounds, no guarding. EXTREMITIES: No edema, No cyanosis, no clubbing MUSCULOSKELETAL:  No edema; No deformity  SKIN: Warm and dry NEUROLOGIC:  Alert and oriented x 3, non-focal PSYCHIATRIC:  Normal affect, good insight  ASSESSMENT:    1. Uncontrolled stage 2 hypertension    PLAN:     1.  Today his blood pressure is 154/98 mmHg.  Going to stop his carvedilol 25 mg twice daily and start the patient on labetalol 200 mg twice daily.  He will remain on his Aldactone 12.5 mg daily, amlodipine 5 mg daily, losartan 50 mg daily and hydralazine 25 mg twice daily.  Hoping for better control on this new regimen.  We do have room to increase his hydralazine to 25 mg every 8 hours.    The patient is in agreement with the above plan. The patient left the office in stable condition.  The patient will follow up in 2 months.   Medication Adjustments/Labs and Tests Ordered: Current medicines are reviewed at length with the patient today.  Concerns regarding medicines are outlined  above.  No orders of the defined types were placed in this encounter.  Meds ordered this encounter  Medications  . labetalol (NORMODYNE) 200 MG tablet    Sig: Take 1 tablet (200 mg total) by mouth 2 (two) times daily.    Dispense:  180 tablet    Refill:  1    Patient Instructions  Medication Instructions:  Your physician has recommended you make the following change in your medication:   STOP COREG (Carvedilol)  START:Labetolol 200 mg Take 1 tab twice daily  *If you need a refill on your cardiac medications before your next appointment, please call your pharmacy*  Lab Work: None If you have labs (blood work) drawn today and your tests are completely normal, you will receive your results only by: Marland Kitchen MyChart Message (if you have MyChart) OR . A paper copy in the mail If you have any lab test that is abnormal or we need to change your treatment, we will call you to review the results.  Testing/Procedures: None  Follow-Up: At [facility], you and your health needs are our priority.  As part of our continuing mission to provide you with exceptional heart care, we have created designated Provider Care Teams.  These Care Teams include your primary Cardiologist (physician) and Advanced Practice Providers (APPs -  Physician Assistants and Nurse Practitioners) who all work together to provide you with the care you need, when you need it.  Your next appointment:   2 months  The format for your next appointment:   In Person  Provider:   Berniece Salines, DO  Other Instructions      Adopting a Healthy Lifestyle.  Know what a healthy weight is for you (roughly BMI <25) and aim to maintain this   Aim for 7+ servings of fruits and vegetables daily   65-80+ fluid ounces of water or unsweet tea for healthy kidneys   Limit to max 1 drink of alcohol per day; avoid smoking/tobacco   Limit animal fats in diet for cholesterol and heart health - choose grass fed whenever available    Avoid highly processed foods, and foods high in saturated/trans fats   Aim for low stress - take  time to unwind and care for your mental health   Aim for 150 min of moderate intensity exercise weekly for heart health, and weights twice weekly for bone health   Aim for 7-9 hours of sleep daily   When it comes to diets, agreement about the perfect plan isnt easy to find, even among the experts. Experts at the [employer] developed an idea known as the Healthy Eating Plate. Just imagine a plate divided into logical, healthy portions.   The emphasis is on diet quality:   Load up on vegetables and fruits - one-half of your plate: Aim for color and variety, and remember that potatoes dont count.   Go for whole grains - one-quarter of your plate: Whole wheat, barley, wheat berries, quinoa, oats, brown rice, and foods made with them. If you want pasta, go with whole wheat pasta.   Protein power - one-quarter of your plate: Fish, chicken, beans, and nuts are all healthy, versatile protein sources. Limit red meat.   The diet, however, does go beyond the plate, offering a few other suggestions.   Use healthy plant oils, such as olive, canola, soy, corn, sunflower and peanut. Check the labels, and avoid partially hydrogenated oil, which have unhealthy trans fats.   If youre thirsty, drink water. Coffee and tea are good in moderation, but skip sugary drinks and limit milk and dairy products to one or two daily servings.   The type of carbohydrate in the diet is more important than the amount. Some sources of carbohydrates, such as vegetables, fruits, whole grains, and beans-are healthier than others.   Finally, stay active  Signed, Berniece Salines, DO  08/10/2019 8:16 PM    [facility]

## 2019-08-10 NOTE — Patient Instructions (Signed)
Medication Instructions:  Your physician has recommended you make the following change in your medication:   STOP COREG (Carvedilol)  START:Labetolol 200 mg Take 1 tab twice daily  *If you need a refill on your cardiac medications before your next appointment, please call your pharmacy*  Lab Work: None If you have labs (blood work) drawn today and your tests are completely normal, you will receive your results only by: Marland Kitchen MyChart Message (if you have MyChart) OR . A paper copy in the mail If you have any lab test that is abnormal or we need to change your treatment, we will call you to review the results.  Testing/Procedures: None  Follow-Up: At [facility], you and your health needs are our priority.  As part of our continuing mission to provide you with exceptional heart care, we have created designated Provider Care Teams.  These Care Teams include your primary Cardiologist (physician) and Advanced Practice Providers (APPs -  Physician Assistants and Nurse Practitioners) who all work together to provide you with the care you need, when you need it.  Your next appointment:   2 months  The format for your next appointment:   In Person  Provider:   Berniece Salines, DO  Other Instructions

## 2019-09-04 ENCOUNTER — Other Ambulatory Visit: Payer: Self-pay | Admitting: *Deleted

## 2019-09-04 ENCOUNTER — Ambulatory Visit: Payer: 59 | Admitting: Cardiology

## 2019-09-04 DIAGNOSIS — I10 Essential (primary) hypertension: Secondary | ICD-10-CM

## 2019-09-04 MED ORDER — LOSARTAN POTASSIUM 50 MG PO TABS: 50.0000 mg | ORAL_TABLET | Freq: Every day | ORAL | 4 refills | Status: DC

## 2019-09-25 ENCOUNTER — Encounter: Payer: Self-pay | Admitting: Adult Health Nurse Practitioner

## 2019-09-25 ENCOUNTER — Other Ambulatory Visit: Payer: Self-pay

## 2019-09-25 ENCOUNTER — Telehealth (INDEPENDENT_AMBULATORY_CARE_PROVIDER_SITE_OTHER): Payer: 59 | Admitting: Adult Health Nurse Practitioner

## 2019-09-25 DIAGNOSIS — I10 Essential (primary) hypertension: Secondary | ICD-10-CM | POA: Diagnosis not present

## 2019-09-25 DIAGNOSIS — R42 Dizziness and giddiness: Secondary | ICD-10-CM

## 2019-09-25 HISTORY — DX: Dizziness and giddiness: R42

## 2019-09-25 NOTE — Patient Instructions (Signed)
° ° ° °  If you have lab work done today you will be contacted with your lab results within the next 2 weeks.  If you have not heard from us then please contact us. The fastest way to get your results is to register for My Chart. ° ° °IF you received an x-ray today, you will receive an invoice from [facility]. Please contact [facility] at [phone] with questions or concerns regarding your invoice.  ° °IF you received labwork today, you will receive an invoice from [facility]. Please contact [facility] at [phone] with questions or concerns regarding your invoice.  ° °Our billing staff will not be able to assist you with questions regarding bills from these companies. ° °You will be contacted with the lab results as soon as they are available. The fastest way to get your results is to activate your My Chart account. Instructions are located on the last page of this paperwork. If you have not heard from us regarding the results in 2 weeks, please contact this office. °  ° ° ° °

## 2019-09-25 NOTE — Progress Notes (Signed)
Telemedicine Encounter- SOAP NOTE Established Patient  This telephone encounter was conducted with the patient's (or proxy's) verbal consent via audio telecommunications: yes/no: Yes Patient was instructed to have this encounter in a suitably private space; and to only have persons present to whom they give permission to participate. In addition, patient identity was confirmed by use of name plus two identifiers (DOB and address).  I discussed the limitations, risks, security and privacy concerns of performing an evaluation and management service by telephone and the availability of in person appointments. I also discussed with the patient that there may be a patient responsible charge related to this service. The patient expressed understanding and agreed to proceed.  I spent a total of TIME; 0 MIN TO 60 MIN: 10 minutes talking with the patient or their proxy.  Chief Complaint  Patient presents with  . Dizzness    Pt stated that he was at work today and went to stand up and started getting dizzy, sob. Pt stated that his heart dr pt him on a strong bp medication. Pt mentioned that he had taken his medications at 1am this moring and forgot that he had taken them and then took them again at 5am this morning    Subjective   Calvin Bailey is a 38 y.o. established patient. Telephone visit today for dizziness   HPI  Patient has an appointment for an episode of dizziness.  He took his normal meds and 1a.m. and then forgot and took them again at 5 a.m.  He had an episode of dizziness at work where he fell to his knees.  A little bit of SOB at that moment.  No chest pain, pressure, or SOB.  No loss of taste or smell.   Patient Active Problem List   Diagnosis Date Noted  . Dizziness 09/25/2019  . Green nails 07/31/2019  . Dyspnea on exertion 05/30/2019  . 3+ pitting edema 05/30/2019  . Tinea versicolor 01/04/2019  . Tinea corporis 01/04/2019  . Chronic cough 06/11/2017  . Abnormal CT of  the chest 06/11/2017  . Hypogonadism male 07/12/2014  . Erectile dysfunction due to arterial insufficiency 07/12/2014  . Obesity hypoventilation syndrome ([place]) 04/08/2014  . Morbid obesity due to excess calories ([place]) 04/08/2014  . OSA (obstructive sleep apnea) 04/08/2014  . Cigarette smoker 02/23/2011  . GERD (gastroesophageal reflux disease) 02/09/2011  . Essential hypertension 07/21/2009    Past Medical History:  Diagnosis Date  . Dizziness 09/25/2019  . GERD (gastroesophageal reflux disease)   . HTN (hypertension)   . Obesity hypoventilation syndrome ([place]) 04/08/2014  . OSA (obstructive sleep apnea) 04/08/2014  . Sleep apnea     Current Outpatient Medications  Medication Sig Dispense Refill  . furosemide (LASIX) 40 MG tablet Take 1 tablet (40 mg total) by mouth daily as needed (for weight gain of 3lbs in 1 day). 90 tablet 0  . hydrALAZINE (APRESOLINE) 25 MG tablet Take 1 tablet (25 mg total) by mouth 2 (two) times daily. 180 tablet 1  . labetalol (NORMODYNE) 200 MG tablet Take 1 tablet (200 mg total) by mouth 2 (two) times daily. 180 tablet 1  . losartan (COZAAR) 50 MG tablet Take 1 tablet (50 mg total) by mouth daily. 30 tablet 4  . spironolactone (ALDACTONE) 25 MG tablet Take 0.5 tablets (12.5 mg total) by mouth daily. 90 tablet 1  . amLODipine (NORVASC) 5 MG tablet Take 1 tablet (5 mg total) by mouth daily. 30 tablet 2  . ciprofloxacin (  CIPRO) 500 MG tablet Take 1 tablet (500 mg total) by mouth 2 (two) times daily. (Patient not taking: Reported on 09/25/2019) 14 tablet 0   No current facility-administered medications for this visit.    Allergies  Allergen Reactions  . Chlorthalidone Nausea Only  . Lisinopril Cough    Social History   Socioeconomic History  . Marital status: Single    Spouse name: Not on file  . Number of children: 1  . Years of education: college  . Highest education level: Not on file  Occupational History  . Occupation: unemployment     Fish farm manager: UNEMPLOYED    Employer: [employer]  Tobacco Use  . Smoking status: Current Every Day Smoker    Packs/day: 1.00    Years: 16.00    Pack years: 16.00    Types: Cigarettes  . Smokeless tobacco: Never Used  Substance and Sexual Activity  . Alcohol use: Yes    Alcohol/week: 0.0 standard drinks    Comment: sparingly   . Drug use: No  . Sexual activity: Yes    Partners: Female  Other Topics Concern  . 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   Patient has one child.   Patient works at [employer].   Patient has a college education.   Patient is left-handed.   Patient does not drink any caffeine.   Social Determinants of Health   Financial Resource Strain:   . Difficulty of Paying Living Expenses: Not on file  Food Insecurity:   . Worried About Charity fundraiser in the Last Year: Not on file  . Ran Out of Food in the Last Year: Not on file  Transportation Needs:   . Lack of Transportation (Medical): Not on file  . Lack of Transportation (Non-Medical): Not on file  Physical Activity:   . Days of Exercise per Week: Not on file  . Minutes of Exercise per Session: Not on file  Stress:   . Feeling of Stress : Not on file  Social Connections:   . Frequency of Communication with Friends and Family: Not on file  . Frequency of Social Gatherings with Friends and Family: Not on file  . Attends Religious Services: Not on file  . Active Member of Clubs or Organizations: Not on file  . Attends Archivist Meetings: Not on file  . Marital Status: Not on file  Intimate Partner Violence:   . Fear of Current or Ex-Partner: Not on file  . Emotionally Abused: Not on file  . Physically Abused: Not on file  . Sexually Abused: Not on file    ROS  Review of Systems See HPI Constitution: No fevers or chills No malaise No diaphoresis Skin: No rash or itching Eyes: no blurry vision, no double  vision GU: no dysuria or hematuria Neuro: no headaches   Objective    General appearance: oriented to person, place, and time. Mental Status: normal mood, behavior, speech, dress, motor activity, and thought processes.   Vitals as reported by the patient: There were no vitals filed for this visit.  Korde was seen today for dizzness.  Diagnoses and all orders for this visit:  Essential hypertension  Dizziness  it sounds like the patient experienced dizziness from the double dose of medications.  I have asked him to hold his meds for the next day and if dizziness continues, will f/u in person.  He is inline with  this plan.      I discussed the assessment and treatment plan with the patient. The patient was provided an opportunity to ask questions and all were answered. The patient agreed with the plan and demonstrated an understanding of the instructions.   The patient was advised to call back or seek an in-person evaluation if the symptoms worsen or if the condition fails to improve as anticipated.  I provided 10 minutes of non-face-to-face time during this encounter.  Glyn Ade, NP  Primary Care at [facility]

## 2019-09-26 ENCOUNTER — Other Ambulatory Visit: Payer: Self-pay | Admitting: Registered Nurse

## 2019-09-26 DIAGNOSIS — I10 Essential (primary) hypertension: Secondary | ICD-10-CM

## 2019-09-26 NOTE — Telephone Encounter (Signed)
Requested medication (s) are due for refill today: yes  Requested medication (s) are on the active medication list: yes  Last refill:  07/30/2019  Future visit scheduled: no  Notes to clinic:  Last filled by cardiologist  Review for refill   Requested Prescriptions  Pending Prescriptions Disp Refills   furosemide (LASIX) 40 MG tablet [Pharmacy Med Name: FUROSEMIDE 40MG  TABLETS] 90 tablet 0    Sig: TAKE 1 TABLET(40 MG) BY MOUTH DAILY      Cardiovascular:  Diuretics - Loop Failed - 09/26/2019 12:12 PM      Failed - Cr in normal range and within 360 days    Creat  Date Value Ref Range Status  04/29/2016 1.22 0.60 - 1.35 mg/dL Final   Creatinine, Ser  Date Value Ref Range Status  07/12/2019 1.53 (H) 0.76 - 1.27 mg/dL Final          Failed - Last BP in normal range    BP Readings from Last 1 Encounters:  08/10/19 (!) 154/98          Passed - K in normal range and within 360 days    Potassium  Date Value Ref Range Status  07/12/2019 4.6 3.5 - 5.2 mmol/L Final          Passed - Ca in normal range and within 360 days    Calcium  Date Value Ref Range Status  07/12/2019 9.1 8.7 - 10.2 mg/dL Final   Calcium, Ion  Date Value Ref Range Status  05/21/2013 1.17 1.12 - 1.23 mmol/L Final          Passed - Na in normal range and within 360 days    Sodium  Date Value Ref Range Status  07/12/2019 137 134 - 144 mmol/L Final          Passed - Valid encounter within last 6 months    Recent Outpatient Visits           Yesterday Essential hypertension   [facility] at [facility], Lorelee Market, NP   1 month ago Green nails   [facility] at [facility], Delfino Lovett, NP   3 months ago Essential hypertension   [facility], Delfino Lovett, NP   8 months ago Tinea versicolor   Primary Care at [facility], Rex Kras, MD   11 months ago Essential hypertension   Primary Care at [facility], Gelene Mink, PA-C       Future Appointments             In 1  week Tobb, Godfrey Pick, DO [place]

## 2019-10-03 ENCOUNTER — Ambulatory Visit: Payer: 59 | Admitting: Cardiology

## 2019-10-05 ENCOUNTER — Ambulatory Visit: Payer: 59 | Admitting: Cardiology

## 2019-10-25 ENCOUNTER — Ambulatory Visit: Payer: 59 | Admitting: Cardiology

## 2019-11-02 ENCOUNTER — Ambulatory Visit: Payer: 59 | Admitting: Cardiology

## 2020-01-01 ENCOUNTER — Other Ambulatory Visit: Payer: Self-pay

## 2020-01-01 ENCOUNTER — Ambulatory Visit
Admission: RE | Admit: 2020-01-01 | Discharge: 2020-01-01 | Disposition: A | Discharge status: Home / Self Care | Payer: Worker's Compensation | Source: Ambulatory Visit | Attending: Nurse Practitioner | Admitting: Nurse Practitioner

## 2020-01-01 ENCOUNTER — Other Ambulatory Visit: Payer: Self-pay | Admitting: Nurse Practitioner

## 2020-01-01 DIAGNOSIS — R52 Pain, unspecified: Secondary | ICD-10-CM

## 2020-01-01 IMAGING — CR DG SHOULDER 2+V*R*
3 series · 3 of 3 positions shown · non-contrast
Comparison: None.

CLINICAL DATA: Right shoulder pain. Injury today at work swinging
pick in ground. Limited range of motion.

EXAM:
RIGHT SHOULDER - 2+ VIEW

[w shoulder grashey right]
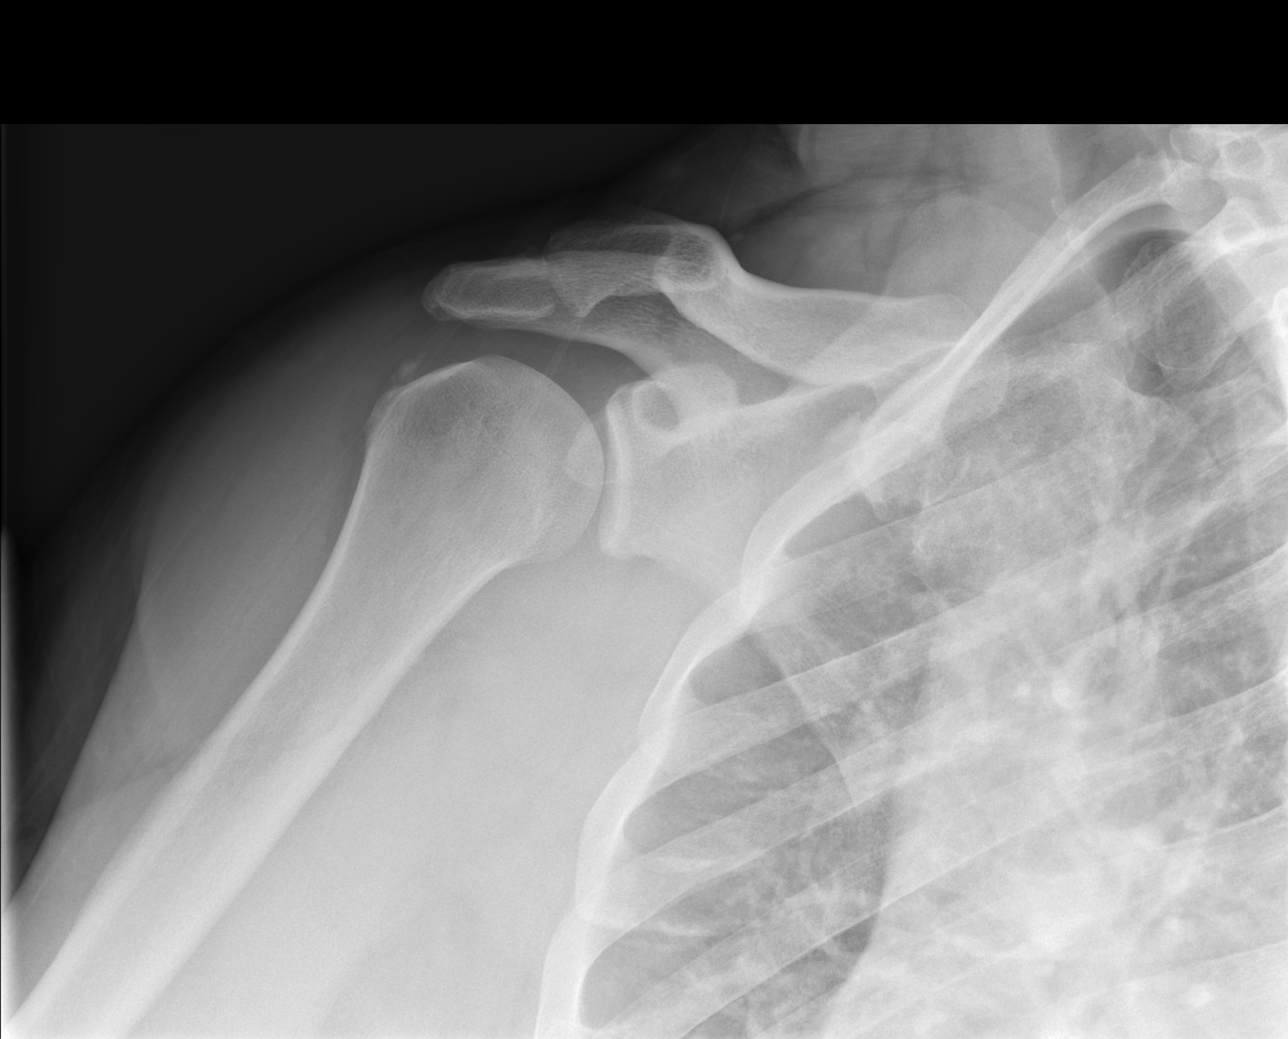

[w shoulder y-view right]
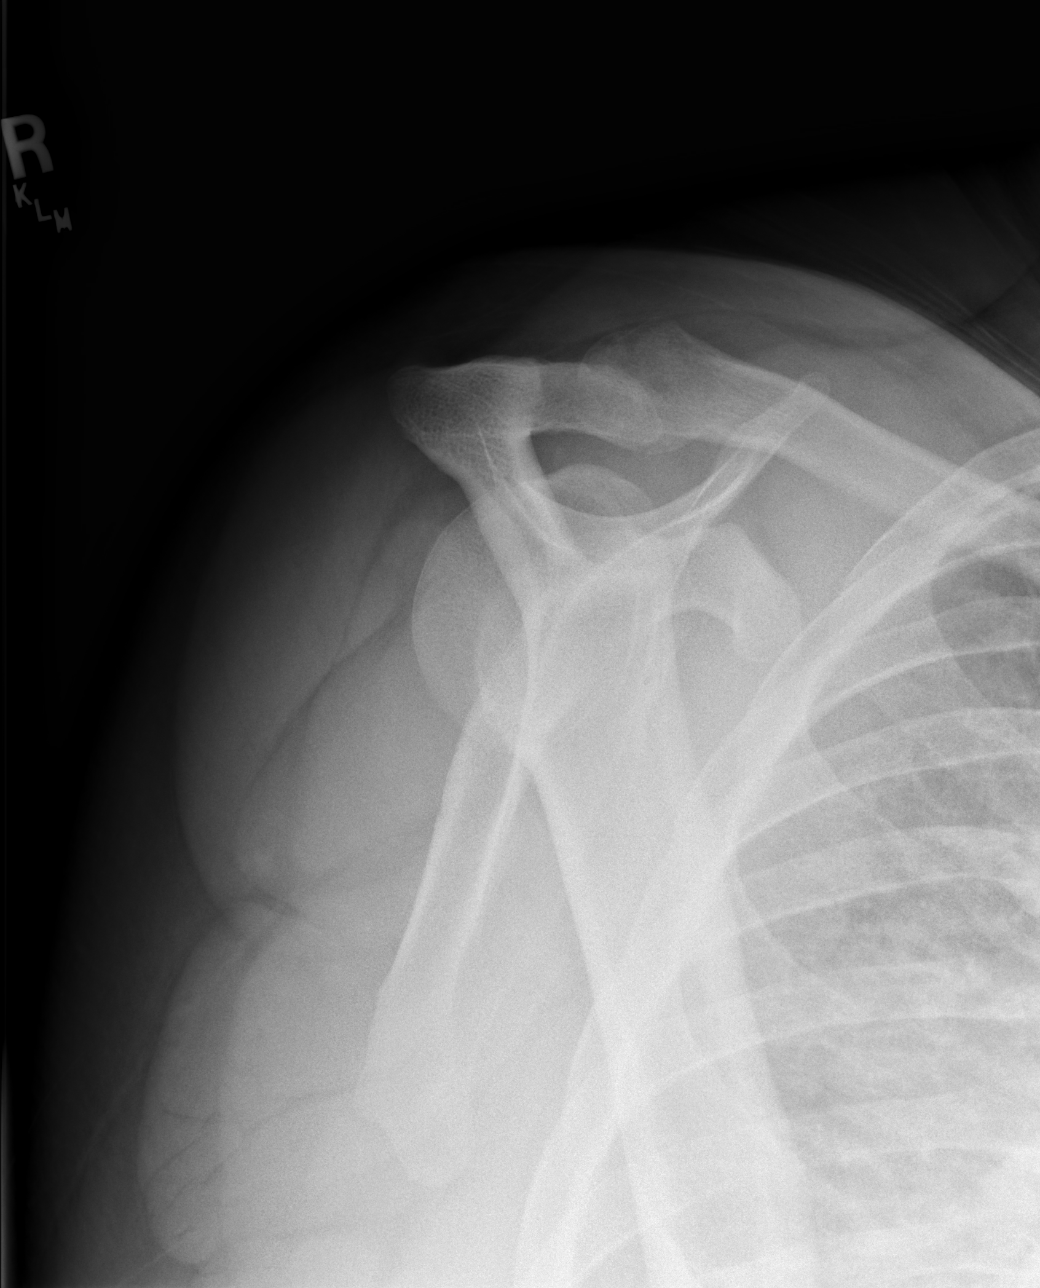

[x shoulder axillary right]
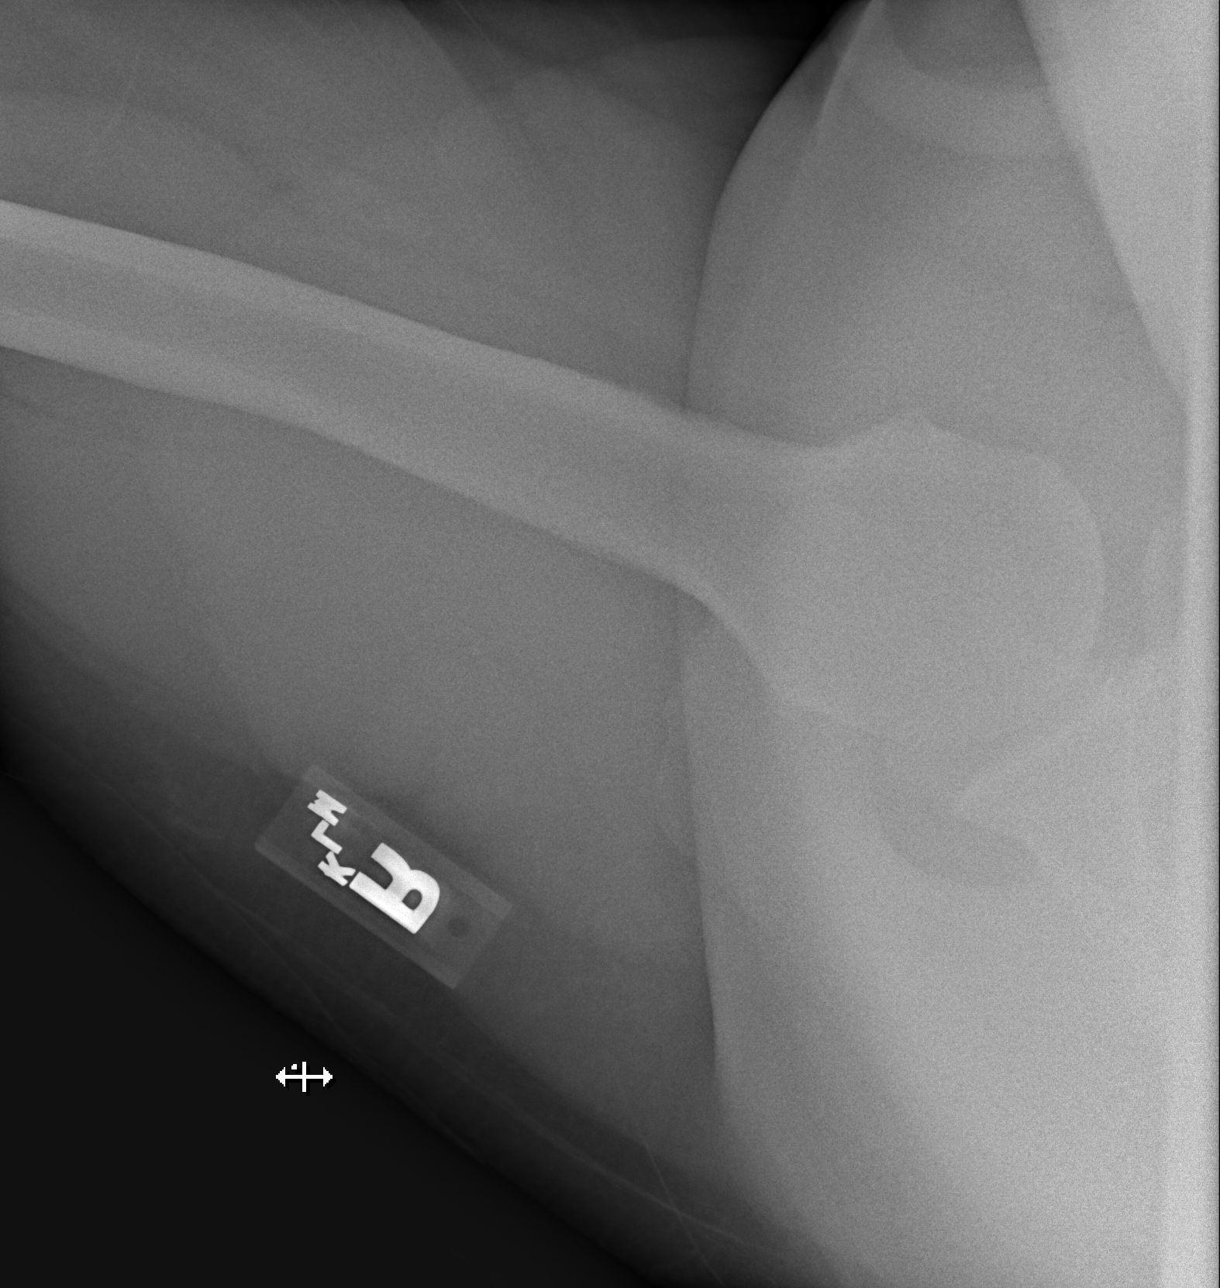

[3 of 3 positions shown; findings below may reference images not displayed]

FINDINGS: There is no evidence of fracture or dislocation. Minimal
acromioclavicular spurring with small inferior osteophytes.
Glenohumeral joint appears normal. Small soft tissue calcification
adjacent to the lateral humeral head.
IMPRESSION: 1. No acute fracture or dislocation.
2. Mild acromioclavicular degenerative change with inferior spurs.
3. Soft tissue calcification adjacent to the lateral humeral head
may represent calcific tendinitis or bursitis.

## 2020-02-04 ENCOUNTER — Encounter: Payer: Self-pay | Admitting: Cardiology

## 2020-02-04 NOTE — Telephone Encounter (Signed)
Error

## 2020-02-05 ENCOUNTER — Other Ambulatory Visit: Payer: Self-pay

## 2020-02-06 ENCOUNTER — Ambulatory Visit: Payer: 59 | Admitting: Cardiology

## 2020-02-11 ENCOUNTER — Other Ambulatory Visit: Payer: Self-pay | Admitting: *Deleted

## 2020-02-11 DIAGNOSIS — I10 Essential (primary) hypertension: Secondary | ICD-10-CM

## 2020-02-11 MED ORDER — HYDRALAZINE HCL 25 MG PO TABS: 25.0000 mg | ORAL_TABLET | Freq: Two times a day (BID) | ORAL | 0 refills | Status: DC

## 2020-02-11 NOTE — Telephone Encounter (Signed)
Received faxed request from pharmacy for refill on Hydralazine 25 mg. Rx sent to pharmacy for #60 and 0 refills with notation for patient to call office to schedule appointment.

## 2020-04-06 ENCOUNTER — Other Ambulatory Visit: Payer: Self-pay

## 2020-04-06 ENCOUNTER — Ambulatory Visit (HOSPITAL_COMMUNITY)
Admission: EM | Admit: 2020-04-06 | Discharge: 2020-04-06 | Disposition: A | Discharge status: Home / Self Care | Payer: 59 | Attending: Family Medicine | Admitting: Family Medicine

## 2020-04-06 ENCOUNTER — Encounter (HOSPITAL_COMMUNITY): Payer: Self-pay

## 2020-04-06 DIAGNOSIS — Z888 Allergy status to other drugs, medicaments and biological substances status: Secondary | ICD-10-CM | POA: Insufficient documentation

## 2020-04-06 DIAGNOSIS — R509 Fever, unspecified: Secondary | ICD-10-CM

## 2020-04-06 DIAGNOSIS — I10 Essential (primary) hypertension: Secondary | ICD-10-CM | POA: Diagnosis not present

## 2020-04-06 DIAGNOSIS — F17210 Nicotine dependence, cigarettes, uncomplicated: Secondary | ICD-10-CM | POA: Insufficient documentation

## 2020-04-06 DIAGNOSIS — G4733 Obstructive sleep apnea (adult) (pediatric): Secondary | ICD-10-CM | POA: Insufficient documentation

## 2020-04-06 DIAGNOSIS — R519 Headache, unspecified: Secondary | ICD-10-CM | POA: Diagnosis not present

## 2020-04-06 DIAGNOSIS — Z79899 Other long term (current) drug therapy: Secondary | ICD-10-CM | POA: Insufficient documentation

## 2020-04-06 DIAGNOSIS — R5383 Other fatigue: Secondary | ICD-10-CM

## 2020-04-06 DIAGNOSIS — R197 Diarrhea, unspecified: Secondary | ICD-10-CM | POA: Insufficient documentation

## 2020-04-06 DIAGNOSIS — Z8249 Family history of ischemic heart disease and other diseases of the circulatory system: Secondary | ICD-10-CM | POA: Diagnosis not present

## 2020-04-06 DIAGNOSIS — R059 Cough, unspecified: Secondary | ICD-10-CM

## 2020-04-06 DIAGNOSIS — R05 Cough: Secondary | ICD-10-CM | POA: Diagnosis not present

## 2020-04-06 DIAGNOSIS — U071 COVID-19: Secondary | ICD-10-CM | POA: Insufficient documentation

## 2020-04-06 DIAGNOSIS — J069 Acute upper respiratory infection, unspecified: Secondary | ICD-10-CM

## 2020-04-06 DIAGNOSIS — R0981 Nasal congestion: Secondary | ICD-10-CM | POA: Diagnosis present

## 2020-04-06 LAB — POCT RAPID STREP A: Streptococcus, Group A Screen (Direct): NEGATIVE

## 2020-04-06 MED ORDER — AMOXICILLIN 875 MG PO TABS: 875.0000 mg | ORAL_TABLET | Freq: Two times a day (BID) | ORAL | 0 refills | Status: DC

## 2020-04-06 NOTE — Discharge Instructions (Addendum)
Your rapid strep test is negative.  A throat culture is pending; we will call you if it is positive requiring treatment.    I have sent in amoxicillin for you to take twice daily for 7 days. I am going to treat you for an upper respiratory infection  Your COVID test is pending.  You should self quarantine until the test result is back.    Take Tylenol as needed for fever or discomfort.  Rest and keep yourself hydrated.    Go to the emergency department if you develop shortness of breath, severe diarrhea, high fever not relieved by Tylenol or ibuprofen, or other concerning symptoms.

## 2020-04-06 NOTE — ED Triage Notes (Addendum)
Pt presents to UC for fever, chills, diarrhea since Monday. Pt endorses sick contacts. Pt states he has been treating fever with tylenol/motrin, concerned for symptom length. Pt denies cough, runny nose, sore throat, HA, n/v, loss of taste or smell. Last ibuprofen 0900. Pt requesting covid swab

## 2020-04-06 NOTE — ED Provider Notes (Signed)
[place]   [id number] 04/06/20 Arrival Time: 9794   CC: COVID symptoms  SUBJECTIVE: History from: patient.  Calvin Bailey is a 39 y.o. male who presents with abrupt onset of nasal congestion, PND, sore throat, fatigue, fever and persistent dry cough for the last 7 days.  Has also been experiencing intermittent diarrhea.  Has not taken any OTC medications for this.  Reports that both of his kids have strep throat. Denies recent travel. Has not taken OTC medications for this. There are no aggravating symptoms. Denies previous symptoms in the past. Denies sinus pain, rhinorrhea, sore throat, SOB, wheezing, chest pain, nausea, changes in bowel or bladder habits.    ROS: As per HPI.  All other pertinent ROS negative.     Past Medical History:  Diagnosis Date  . Dizziness 09/25/2019  . GERD (gastroesophageal reflux disease)   . HTN (hypertension)   . Obesity hypoventilation syndrome ([place]) 04/08/2014  . OSA (obstructive sleep apnea) 04/08/2014  . Sleep apnea    Past Surgical History:  Procedure Laterality Date  . two knee surgeries Left 1995   Allergies  Allergen Reactions  . Chlorthalidone Nausea Only  . Lisinopril Cough   No current facility-administered medications on file prior to encounter.   Current Outpatient Medications on File Prior to Encounter  Medication Sig Dispense Refill  . amLODipine (NORVASC) 10 MG tablet Take 10 mg by mouth daily.    . furosemide (LASIX) 40 MG tablet TAKE 1 TABLET(40 MG) BY MOUTH DAILY 90 tablet 0  . hydrALAZINE (APRESOLINE) 25 MG tablet Take 1 tablet (25 mg total) by mouth 2 (two) times daily. PLEASE CALL OFFICE TO SCHEDULE AN APPOINTMENT FOR FURTHER REFILLS 60 tablet 0  . labetalol (NORMODYNE) 200 MG tablet Take 1 tablet (200 mg total) by mouth 2 (two) times daily. 180 tablet 1  . losartan (COZAAR) 50 MG tablet Take 1 tablet (50 mg total) by mouth daily. 30 tablet 4  . spironolactone (ALDACTONE) 25 MG tablet Take 0.5 tablets (12.5  mg total) by mouth daily. 90 tablet 1  . amLODipine (NORVASC) 5 MG tablet Take 1 tablet (5 mg total) by mouth daily. 30 tablet 2  . azithromycin (ZITHROMAX) 250 MG tablet Take by mouth as directed.    . carvedilol (COREG) 25 MG tablet Take 25 mg by mouth 2 (two) times daily. (Patient not taking: Reported on 04/06/2020)    . ciprofloxacin (CIPRO) 500 MG tablet Take 1 tablet (500 mg total) by mouth 2 (two) times daily. (Patient not taking: Reported on 09/25/2019) 14 tablet 0  . oseltamivir (TAMIFLU) 75 MG capsule Take 75 mg by mouth 2 (two) times daily.    . [DISCONTINUED] LISINOPRIL-HYDROCHLOROTHIAZIDE PO Take by mouth.     Social History   Socioeconomic History  . Marital status: Single    Spouse name: Not on file  . Number of children: 1  . Years of education: college  . Highest education level: Not on file  Occupational History  . Occupation: unemployment    Fish farm manager: UNEMPLOYED    Employer: [employer]  Tobacco Use  . Smoking status: Current Every Day Smoker    Packs/day: 1.00    Years: 16.00    Pack years: 16.00    Types: Cigarettes  . Smokeless tobacco: Never Used  Substance and Sexual Activity  . Alcohol use: Yes    Alcohol/week: 0.0 standard drinks    Comment: sparingly   . Drug use: No  . Sexual activity: Yes  Partners: Female  Other Topics Concern  . 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   Patient has one child.   Patient works at [employer].   Patient has a college education.   Patient is left-handed.   Patient does not drink any caffeine.   Social Determinants of Health   Financial Resource Strain:   . Difficulty of Paying Living Expenses:   Food Insecurity:   . Worried About Charity fundraiser in the Last Year:   . Arboriculturist in the Last Year:   Transportation Needs:   . Film/video editor (Medical):   Marland Kitchen Lack of Transportation (Non-Medical):   Physical Activity:   . Days  of Exercise per Week:   . Minutes of Exercise per Session:   Stress:   . Feeling of Stress :   Social Connections:   . Frequency of Communication with Friends and Family:   . Frequency of Social Gatherings with Friends and Family:   . Attends Religious Services:   . Active Member of Clubs or Organizations:   . Attends Archivist Meetings:   Marland Kitchen Marital Status:   Intimate Partner Violence:   . Fear of Current or Ex-Partner:   . Emotionally Abused:   Marland Kitchen Physically Abused:   . Sexually Abused:    Family History  Problem Relation Age of Onset  . Hypertension Mother   . Multiple sclerosis Mother   . Hypertension Father   . Hypertension Brother   . Hyperlipidemia Paternal Grandfather   . Heart failure Paternal Grandfather   . Diabetes Maternal Grandmother   . Heart failure Maternal Grandfather   . CVA Maternal Uncle     OBJECTIVE:  Vitals:   04/06/20 1319 04/06/20 1320  BP:  (!) 159/94  Pulse: 75 75  Resp: 16 16  Temp: 99 F (37.2 C) 99 F (37.2 C)  TempSrc: Oral Oral  SpO2: 97% 97%     General appearance: alert; appears fatigued, but nontoxic; speaking in full sentences and tolerating own secretions HEENT: NCAT; Ears: EACs clear, TMs pearly gray; Eyes: PERRL.  EOM grossly intact. Sinuses: nontender; Nose: nares patent without rhinorrhea, Throat: oropharynx clear, tonsils +1 and erythematous  uvula midline  Neck: supple without LAD Lungs: unlabored respirations, symmetrical air entry; cough: mild; no respiratory distress; CTAB Heart: regular rate and rhythm.  Radial pulses 2+ symmetrical bilaterally Skin: warm and dry Psychological: alert and cooperative; normal mood and affect  LABS:  Results for orders placed or performed during the hospital encounter of 04/06/20 (from the past 24 hour(s))  POCT rapid strep A [facility])     Status: None   Collection Time: 04/06/20  2:28 PM  Result Value Ref Range   Streptococcus, Group A Screen (Direct) NEGATIVE  NEGATIVE     ASSESSMENT & PLAN:  1. Fever, unspecified fever cause   2. Cough   3. Diarrhea, unspecified type   4. Nonintractable headache, unspecified chronicity pattern, unspecified headache type   5. Other fatigue   6. Upper respiratory tract infection, unspecified type     Meds ordered this encounter  Medications  . amoxicillin (AMOXIL) 875 MG tablet    Sig: Take 1 tablet (875 mg total) by mouth 2 (two) times daily for 10 days.    Dispense:  20 tablet    Refill:  0    Order Specific Question:   Supervising Provider  AnswerChase Picket [[id number]]    URI Prescribed amoxicillin 875 twice daily x7 days given length of symptoms Rapid strep negative today. Will culture strep and results will be available via [place] testing ordered.  It will take between 1-2 days for test results.  Someone will contact you regarding abnormal results.    Patient should remain in quarantine until they have received Covid results.  If negative you may resume normal activities (go back to work/school) while practicing hand hygiene, social distance, and mask wearing.  If positive, patient should remain in quarantine for 10 days from symptom onset AND greater than 72 hours after symptoms resolution (absence of fever without the use of fever-reducing medication and improvement in respiratory symptoms), whichever is longer Get plenty of rest and push fluids Use OTC zyrtec for nasal congestion, runny nose, and/or sore throat Use OTC flonase for nasal congestion and runny nose Use medications daily for symptom relief Use OTC medications like ibuprofen or tylenol as needed fever or pain Call or go to the ED if you have any new or worsening symptoms such as fever, worsening cough, shortness of breath, chest tightness, chest pain, turning blue, changes in mental status.  Reviewed expectations re: course of current medical issues. Questions answered. Outlined signs and symptoms indicating need  for more acute intervention. Patient verbalized understanding. After Visit Summary given.         Faustino Congress, NP 04/06/20 1442

## 2020-04-07 ENCOUNTER — Emergency Department (HOSPITAL_COMMUNITY): Payer: 59

## 2020-04-07 ENCOUNTER — Other Ambulatory Visit: Payer: Self-pay

## 2020-04-07 ENCOUNTER — Encounter (HOSPITAL_COMMUNITY): Payer: Self-pay

## 2020-04-07 ENCOUNTER — Emergency Department (HOSPITAL_COMMUNITY)
Admission: EM | Admit: 2020-04-07 | Discharge: 2020-04-07 | Disposition: A | Discharge status: Home / Self Care | Payer: 59 | Source: Home / Self Care | Attending: Emergency Medicine | Admitting: Emergency Medicine

## 2020-04-07 ENCOUNTER — Other Ambulatory Visit: Payer: Self-pay | Admitting: Adult Health

## 2020-04-07 DIAGNOSIS — U071 COVID-19: Secondary | ICD-10-CM

## 2020-04-07 DIAGNOSIS — I10 Essential (primary) hypertension: Secondary | ICD-10-CM | POA: Insufficient documentation

## 2020-04-07 DIAGNOSIS — J9601 Acute respiratory failure with hypoxia: Secondary | ICD-10-CM | POA: Diagnosis not present

## 2020-04-07 DIAGNOSIS — F17210 Nicotine dependence, cigarettes, uncomplicated: Secondary | ICD-10-CM | POA: Insufficient documentation

## 2020-04-07 DIAGNOSIS — Z79899 Other long term (current) drug therapy: Secondary | ICD-10-CM | POA: Insufficient documentation

## 2020-04-07 LAB — SARS CORONAVIRUS 2 (TAT 6-24 HRS): SARS Coronavirus 2: POSITIVE — AB

## 2020-04-07 IMAGING — DX DG CHEST 1V PORT
1 series · 1 of 1 positions shown · non-contrast
Comparison: [DATE]

CLINICAL DATA: COVID positive, cough

EXAM:
PORTABLE CHEST 1 VIEW

[chest ap]
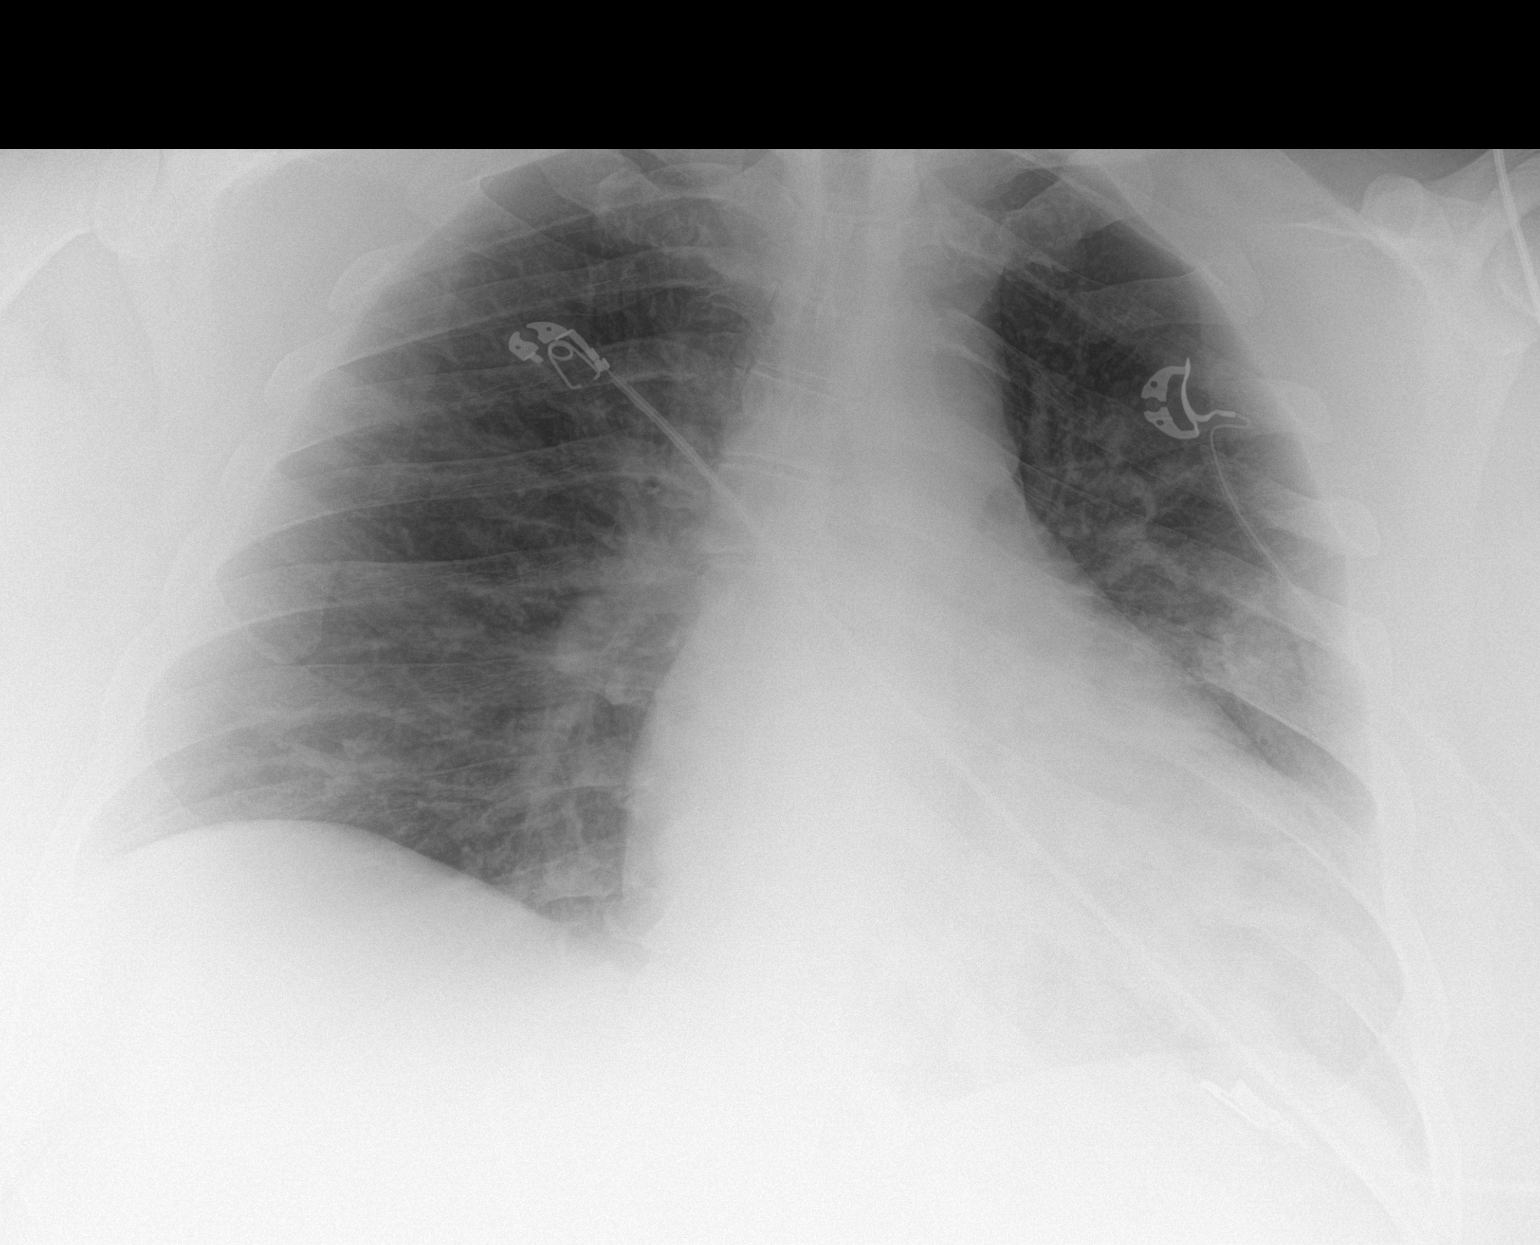

[1 of 1 positions shown; findings below may reference images not displayed]

FINDINGS: The heart size and mediastinal contours are borderline enlarged.
There is hazy airspace opacity seen within the periphery of the left
mid lung and retrocardiac region. No pleural effusion. No acute
osseous abnormality.
IMPRESSION: Hazy airspace opacities within the left lung which could be due to
infectious etiology and/or atelectasis.

## 2020-04-07 MED ORDER — ACETAMINOPHEN 325 MG PO TABS
650.0000 mg | ORAL_TABLET | Freq: Once | ORAL | Status: AC
  Administered 2020-04-07: 650 mg via ORAL
  Filled 2020-04-07: qty 2

## 2020-04-07 NOTE — ED Provider Notes (Signed)
[facility] DEPT Provider Note   CSN: [id number] Arrival date & time: 04/07/20  1623     History Chief Complaint  Patient presents with  . COVID POSITIVE  . Shortness of Breath    Calvin Bailey is a 39 y.o. male.  HPI   Patient presents to the ED for evaluation of persistent symptoms associated with Covid.  Patient states his symptoms started on July 5.  He felt fine on July 4.  Since that time he started having symptoms of nasal congestion, sore throat, fatigue fever and persistent dry cough.  Has had some intermittent loose stools.  Denies any sore throat.  No chest pain.  Patient states he went to an urgent care on the 11th.  He was called back today and was told his Covid test was positive.  Patient states he started to feel little short of breath.  He came to the ED to be evaluated.  Past Medical History:  Diagnosis Date  . Dizziness 09/25/2019  . GERD (gastroesophageal reflux disease)   . HTN (hypertension)   . Obesity hypoventilation syndrome ([place]) 04/08/2014  . OSA (obstructive sleep apnea) 04/08/2014  . Sleep apnea     Patient Active Problem List   Diagnosis Date Noted  . Dizziness 09/25/2019  . Green nails 07/31/2019  . Dyspnea on exertion 05/30/2019  . 3+ pitting edema 05/30/2019  . Tinea versicolor 01/04/2019  . Tinea corporis 01/04/2019  . Chronic cough 06/11/2017  . Abnormal CT of the chest 06/11/2017  . Herpes simplex infection of penis 02/15/2017  . Hypogonadism male 07/12/2014  . Erectile dysfunction due to arterial insufficiency 07/12/2014  . Obesity hypoventilation syndrome ([place]) 04/08/2014  . Morbid obesity due to excess calories ([place]) 04/08/2014  . OSA (obstructive sleep apnea) 04/08/2014  . Cigarette smoker 02/23/2011  . Tobacco use disorder 02/23/2011  . GERD (gastroesophageal reflux disease) 02/09/2011  . Essential hypertension 07/21/2009    Past Surgical History:  Procedure Laterality Date  . two knee  surgeries Left 1995       Family History  Problem Relation Age of Onset  . Hypertension Mother   . Multiple sclerosis Mother   . Hypertension Father   . Hypertension Brother   . Hyperlipidemia Paternal Grandfather   . Heart failure Paternal Grandfather   . Diabetes Maternal Grandmother   . Heart failure Maternal Grandfather   . CVA Maternal Uncle     Social History   Tobacco Use  . Smoking status: Current Every Day Smoker    Packs/day: 1.00    Years: 16.00    Pack years: 16.00    Types: Cigarettes  . Smokeless tobacco: Never Used  Substance Use Topics  . Alcohol use: Yes    Alcohol/week: 0.0 standard drinks    Comment: sparingly   . Drug use: No    Home Medications Prior to Admission medications   Medication Sig Start Date End Date Taking? Authorizing Provider  amLODipine (NORVASC) 10 MG tablet Take 10 mg by mouth daily. 10/22/19   [provider]  amLODipine (NORVASC) 5 MG tablet Take 1 tablet (5 mg total) by mouth daily. 06/01/19 08/30/19  Tobb, Kardie, DO  amoxicillin (AMOXIL) 875 MG tablet Take 1 tablet (875 mg total) by mouth 2 (two) times daily for 10 days. 04/06/20 04/16/20  Faustino Congress, NP  azithromycin (ZITHROMAX) 250 MG tablet Take by mouth as directed. 10/20/19   [provider]  carvedilol (COREG) 25 MG tablet Take 25 mg by mouth  2 (two) times daily. Patient not taking: Reported on 04/06/2020 12/21/19   [provider]  ciprofloxacin (CIPRO) 500 MG tablet Take 1 tablet (500 mg total) by mouth 2 (two) times daily. Patient not taking: Reported on 09/25/2019 07/31/19   Maximiano Coss, NP  furosemide (LASIX) 40 MG tablet TAKE 1 TABLET(40 MG) BY MOUTH DAILY 09/26/19   Maximiano Coss, NP  hydrALAZINE (APRESOLINE) 25 MG tablet Take 1 tablet (25 mg total) by mouth 2 (two) times daily. PLEASE CALL OFFICE TO SCHEDULE AN APPOINTMENT FOR FURTHER REFILLS 02/11/20 04/11/20  Tobb, Godfrey Pick, DO  labetalol (NORMODYNE) 200 MG tablet Take 1 tablet (200  mg total) by mouth 2 (two) times daily. 08/10/19   Tobb, Kardie, DO  losartan (COZAAR) 50 MG tablet Take 1 tablet (50 mg total) by mouth daily. 09/04/19 04/06/20  Tobb, Godfrey Pick, DO  oseltamivir (TAMIFLU) 75 MG capsule Take 75 mg by mouth 2 (two) times daily. 09/15/19   [provider]  spironolactone (ALDACTONE) 25 MG tablet Take 0.5 tablets (12.5 mg total) by mouth daily. 07/11/19 04/06/20  Tobb, Kardie, DO  LISINOPRIL-HYDROCHLOROTHIAZIDE PO Take by mouth.  07/06/19  [provider]    Allergies    Chlorthalidone and Lisinopril  Review of Systems   Review of Systems  All other systems reviewed and are negative.   Physical Exam Updated Vital Signs BP (!) 151/84   Pulse 71   Temp 100.3 F (37.9 C) (Oral)   Resp 17   SpO2 95%   Physical Exam Vitals and nursing note reviewed.  Constitutional:      General: He is not in acute distress.    Appearance: Normal appearance. He is well-developed. He is obese. He is not diaphoretic.  HENT:     Head: Normocephalic and atraumatic. No raccoon eyes or Battle's sign.     Right Ear: External ear normal.     Left Ear: External ear normal.  Eyes:     General: Lids are normal.        Right eye: No discharge.     Conjunctiva/sclera:     Right eye: No hemorrhage.    Left eye: No hemorrhage. Neck:     Trachea: No tracheal deviation.  Cardiovascular:     Rate and Rhythm: Normal rate and regular rhythm.     Heart sounds: Normal heart sounds.  Pulmonary:     Effort: Pulmonary effort is normal. No respiratory distress.     Breath sounds: Normal breath sounds. No stridor.  Chest:     Chest wall: No deformity, tenderness or crepitus.  Abdominal:     General: Bowel sounds are normal. There is no distension.     Palpations: Abdomen is soft. There is no mass.     Tenderness: There is no abdominal tenderness.     Comments: Negative for seat belt sign  Musculoskeletal:     Cervical back: No swelling, edema, deformity or tenderness.  No spinous process tenderness.     Thoracic back: No swelling, deformity or tenderness.     Lumbar back: No swelling or tenderness.     Right lower leg: No edema.     Left lower leg: No edema.     Comments: Pelvis stable, no ttp  Neurological:     Mental Status: He is alert.     GCS: GCS eye subscore is 4. GCS verbal subscore is 5. GCS motor subscore is 6.     Sensory: No sensory deficit.     Motor: No abnormal  muscle tone.     Comments: Able to move all extremities, sensation intact throughout  Psychiatric:        Speech: Speech normal.        Behavior: Behavior normal.     ED Results / Procedures / Treatments   Labs (all labs ordered are listed, but only abnormal results are displayed) Labs Reviewed - No data to display  EKG None  Radiology DG Chest Portable 1 View  Result Date: 04/07/2020 CLINICAL DATA:  COVID positive, cough EXAM: PORTABLE CHEST 1 VIEW COMPARISON:  May 30, 2019 FINDINGS: The heart size and mediastinal contours are borderline enlarged. There is hazy airspace opacity seen within the periphery of the left mid lung and retrocardiac region. No pleural effusion. No acute osseous abnormality. IMPRESSION: Hazy airspace opacities within the left lung which could be due to infectious etiology and/or atelectasis. Electronically Signed   By: Prudencio Pair M.D.   On: 04/07/2020 17:50    Procedures Procedures (including critical care time)  Medications Ordered in ED Medications  acetaminophen (TYLENOL) tablet 650 mg (has no administration in time range)    ED Course  I have reviewed the triage vital signs and the nursing notes.  Pertinent labs & imaging results that were available during my care of the patient were reviewed by me and considered in my medical decision making (see chart for details).  Clinical Course as of Apr 07 1756  Mon Apr 07, 2020  1711 Message sent to [facility]   [JK]  1755 Chest x-ray shows hazy opacity.  Could be atelectasis  versus early pneumonia.   [JK]    Clinical Course User Index [JK] Dorie Rank, MD   MDM Rules/Calculators/A&P                          Patient presents ED with symptoms associated with Covid.   Patient went to an urgent care yesterday and had a Covid test.  He was informed that it was positive today.  Patient's oxygen saturation is normal.  It stayed above 95 the entire time was in the room with him.  Patient does had risk factors that make him a candidate for monoclonal antibody infusion.  Patient states that symptoms started on the fifth so I believe he is within the treatment window.  I will place a referral.  NP Causey from the [facility] has arranged for pt to receive treatment tomorrow.  Pt remains hemodynamically stable without oxygen requirement.  Stable for discharge  Final Clinical Impression(s) / ED Diagnoses Final diagnoses:  COVID-19 virus infection    Rx / DC Orders ED Discharge Orders    None       Dorie Rank, MD 04/07/20 1757

## 2020-04-07 NOTE — ED Triage Notes (Signed)
Pt reports generalized body aches and fever since Monday. Pt reports testing positive for COVID yesterday. Pt reports that today he became [facility].

## 2020-04-07 NOTE — Progress Notes (Signed)
I connected by phone with Calvin Bailey on 04/07/2020 at 5:36 PM to discuss the potential use of a new treatment for mild to moderate COVID-19 viral infection in non-hospitalized patients.  This patient is a 39 y.o. male that meets the FDA criteria for Emergency Use Authorization of COVID monoclonal antibody casirivimab/imdevimab.  Has a (+) direct SARS-CoV-2 viral test result  Has mild or moderate COVID-19   Is NOT hospitalized due to COVID-19  Is within 10 days of symptom onset  Has at least one of the high risk factor(s) for progression to severe COVID-19 and/or hospitalization as defined in EUA.  Specific high risk criteria : BMI > 25, chronic lung disease   I have spoken and communicated the following to the patient or parent/caregiver regarding COVID monoclonal antibody treatment:  1. FDA has authorized the emergency use for the treatment of mild to moderate COVID-19 in adults and pediatric patients with positive results of direct SARS-CoV-2 viral testing who are 38 years of age and older weighing at least 40 kg, and who are at high risk for progressing to severe COVID-19 and/or hospitalization.  2. The significant known and potential risks and benefits of COVID monoclonal antibody, and the extent to which such potential risks and benefits are unknown.  3. Information on available alternative treatments and the risks and benefits of those alternatives, including clinical trials.  4. Patients treated with COVID monoclonal antibody should continue to self-isolate and use infection control measures (e.g., wear mask, isolate, social distance, avoid sharing personal items, clean and disinfect "high touch" surfaces, and frequent handwashing) according to CDC guidelines.   5. The patient or parent/caregiver has the option to accept or refuse COVID monoclonal antibody treatment.  After reviewing this information with the patient, The patient agreed to proceed with receiving  casirivimab\imdevimab infusion and will be provided a copy of the Fact sheet prior to receiving the infusion. Scot Dock 04/07/2020 5:36 PM

## 2020-04-07 NOTE — Discharge Instructions (Signed)
You have been scheduled to receive treatment with monoclonal antibody therapy for COVID19 positive patients who are at increased risk for complications and/or hospitalization from the virus.  This therapy will be given on 7/13 at 1230 pm.     Please arrive at our [facility], [address] and follow the COVID flag, and park in the designated spot for infusion.  Call the number listed at the the spot, and someone will come out to get you.   You must wear a masks, and there is a no visitor policy in place at the clinic.   Please start your day as you normally would, eat breakfast, take any medications prescribed to you, and drink plenty of fluids.  You do not need to be fasting to receive your treatment.     For further questions, feel free to call us at [phone].  On behalf of the COVID 19 monoclonal antibody team, we wish you a speedy recovery.     For longer term health concerns from [employer], we have a [place] clinic [phone], and they can help you with any further non-emergent COVID19 issues that may arise.   Thank you!   Wilber Bihari, NP

## 2020-04-08 ENCOUNTER — Ambulatory Visit (HOSPITAL_COMMUNITY)
Admission: RE | Admit: 2020-04-08 | Discharge: 2020-04-08 | Disposition: A | Discharge status: Home / Self Care | Payer: 59 | Source: Ambulatory Visit | Attending: Pulmonary Disease | Admitting: Pulmonary Disease

## 2020-04-08 DIAGNOSIS — U071 COVID-19: Secondary | ICD-10-CM | POA: Insufficient documentation

## 2020-04-08 MED ORDER — FAMOTIDINE IN NACL 20-0.9 MG/50ML-% IV SOLN: 20.0000 mg | Freq: Once | INTRAVENOUS | Status: DC | PRN

## 2020-04-08 MED ORDER — METHYLPREDNISOLONE SODIUM SUCC 125 MG IJ SOLR: 125.0000 mg | Freq: Once | INTRAMUSCULAR | Status: DC | PRN

## 2020-04-08 MED ORDER — SODIUM CHLORIDE 0.9 % IV SOLN
Freq: Once | INTRAVENOUS | Status: AC
  Filled 2020-04-08: qty 600

## 2020-04-08 MED ORDER — DIPHENHYDRAMINE HCL 50 MG/ML IJ SOLN: 50.0000 mg | Freq: Once | INTRAMUSCULAR | Status: DC | PRN

## 2020-04-08 MED ORDER — SODIUM CHLORIDE 0.9 % IV SOLN: INTRAVENOUS | Status: DC | PRN

## 2020-04-08 MED ORDER — ALBUTEROL SULFATE HFA 108 (90 BASE) MCG/ACT IN AERS: 2.0000 | INHALATION_SPRAY | Freq: Once | RESPIRATORY_TRACT | Status: DC | PRN

## 2020-04-08 MED ORDER — EPINEPHRINE 0.3 MG/0.3ML IJ SOAJ: 0.3000 mg | Freq: Once | INTRAMUSCULAR | Status: DC | PRN

## 2020-04-08 NOTE — Discharge Instructions (Signed)

## 2020-04-09 LAB — CULTURE, GROUP A STREP (THRC)

## 2020-04-10 ENCOUNTER — Inpatient Hospital Stay (HOSPITAL_COMMUNITY)
Admission: EM | Admit: 2020-04-10 | Discharge: 2020-04-12 | DRG: 177 | Disposition: A | Discharge status: Home / Self Care | Payer: 59 | Attending: Family Medicine | Admitting: Family Medicine

## 2020-04-10 ENCOUNTER — Other Ambulatory Visit: Payer: Self-pay

## 2020-04-10 ENCOUNTER — Emergency Department (HOSPITAL_COMMUNITY): Payer: 59

## 2020-04-10 DIAGNOSIS — Z79899 Other long term (current) drug therapy: Secondary | ICD-10-CM

## 2020-04-10 DIAGNOSIS — E662 Morbid (severe) obesity with alveolar hypoventilation: Secondary | ICD-10-CM | POA: Diagnosis present

## 2020-04-10 DIAGNOSIS — F17210 Nicotine dependence, cigarettes, uncomplicated: Secondary | ICD-10-CM | POA: Diagnosis present

## 2020-04-10 DIAGNOSIS — Z8249 Family history of ischemic heart disease and other diseases of the circulatory system: Secondary | ICD-10-CM

## 2020-04-10 DIAGNOSIS — Y92230 Patient room in hospital as the place of occurrence of the external cause: Secondary | ICD-10-CM | POA: Diagnosis not present

## 2020-04-10 DIAGNOSIS — K219 Gastro-esophageal reflux disease without esophagitis: Secondary | ICD-10-CM | POA: Diagnosis present

## 2020-04-10 DIAGNOSIS — G4733 Obstructive sleep apnea (adult) (pediatric): Secondary | ICD-10-CM | POA: Diagnosis present

## 2020-04-10 DIAGNOSIS — T500X5A Adverse effect of mineralocorticoids and their antagonists, initial encounter: Secondary | ICD-10-CM | POA: Diagnosis not present

## 2020-04-10 DIAGNOSIS — Z888 Allergy status to other drugs, medicaments and biological substances status: Secondary | ICD-10-CM | POA: Diagnosis not present

## 2020-04-10 DIAGNOSIS — N179 Acute kidney failure, unspecified: Secondary | ICD-10-CM | POA: Diagnosis present

## 2020-04-10 DIAGNOSIS — U071 COVID-19: Principal | ICD-10-CM | POA: Diagnosis present

## 2020-04-10 DIAGNOSIS — E785 Hyperlipidemia, unspecified: Secondary | ICD-10-CM | POA: Diagnosis present

## 2020-04-10 DIAGNOSIS — J9600 Acute respiratory failure, unspecified whether with hypoxia or hypercapnia: Secondary | ICD-10-CM | POA: Diagnosis not present

## 2020-04-10 DIAGNOSIS — I10 Essential (primary) hypertension: Secondary | ICD-10-CM | POA: Diagnosis present

## 2020-04-10 DIAGNOSIS — R609 Edema, unspecified: Secondary | ICD-10-CM | POA: Diagnosis not present

## 2020-04-10 DIAGNOSIS — E875 Hyperkalemia: Secondary | ICD-10-CM | POA: Diagnosis not present

## 2020-04-10 DIAGNOSIS — J1282 Pneumonia due to coronavirus disease 2019: Secondary | ICD-10-CM | POA: Diagnosis present

## 2020-04-10 DIAGNOSIS — J9601 Acute respiratory failure with hypoxia: Secondary | ICD-10-CM | POA: Diagnosis present

## 2020-04-10 LAB — CBC WITH DIFFERENTIAL/PLATELET
Abs Immature Granulocytes: 0.07 10*3/uL (ref 0.00–0.07)
Basophils Absolute: 0 10*3/uL (ref 0.0–0.1)
Basophils Relative: 0 %
Eosinophils Absolute: 0.1 10*3/uL (ref 0.0–0.5)
Eosinophils Relative: 1 %
HCT: 46.4 % (ref 39.0–52.0)
Hemoglobin: 15.6 g/dL (ref 13.0–17.0)
Immature Granulocytes: 1 %
Lymphocytes Relative: 23 %
Lymphs Abs: 1.3 10*3/uL (ref 0.7–4.0)
MCH: 29.3 pg (ref 26.0–34.0)
MCHC: 33.6 g/dL (ref 30.0–36.0)
MCV: 87.1 fL (ref 80.0–100.0)
Monocytes Absolute: 0.6 10*3/uL (ref 0.1–1.0)
Monocytes Relative: 11 %
Neutro Abs: 3.6 10*3/uL (ref 1.7–7.7)
Neutrophils Relative %: 64 %
Platelets: 242 10*3/uL (ref 150–400)
RBC: 5.33 MIL/uL (ref 4.22–5.81)
RDW: 12.8 % (ref 11.5–15.5)
WBC: 5.7 10*3/uL (ref 4.0–10.5)
nRBC: 0 % (ref 0.0–0.2)

## 2020-04-10 LAB — COMPREHENSIVE METABOLIC PANEL
ALT: 60 U/L — ABNORMAL HIGH (ref 0–44)
AST: 61 U/L — ABNORMAL HIGH (ref 15–41)
Albumin: 3.6 g/dL (ref 3.5–5.0)
Alkaline Phosphatase: 71 U/L (ref 38–126)
Anion gap: 12 (ref 5–15)
BUN: 31 mg/dL — ABNORMAL HIGH (ref 6–20)
CO2: 23 mmol/L (ref 22–32)
Calcium: 8.5 mg/dL — ABNORMAL LOW (ref 8.9–10.3)
Chloride: 104 mmol/L (ref 98–111)
Creatinine, Ser: 1.99 mg/dL — ABNORMAL HIGH (ref 0.61–1.24)
GFR calc Af Amer: 47 mL/min — ABNORMAL LOW (ref >60)
GFR calc non Af Amer: 41 mL/min — ABNORMAL LOW (ref >60)
Glucose, Bld: 106 mg/dL — ABNORMAL HIGH (ref 70–99)
Potassium: 4.3 mmol/L (ref 3.5–5.1)
Sodium: 139 mmol/L (ref 135–145)
Total Bilirubin: 0.7 mg/dL (ref 0.3–1.2)
Total Protein: 7.8 g/dL (ref 6.5–8.1)

## 2020-04-10 LAB — PROCALCITONIN: Procalcitonin: <0.1 ng/mL

## 2020-04-10 LAB — ABO/RH: ABO/RH(D): A POS

## 2020-04-10 LAB — FERRITIN: Ferritin: 1202 ng/mL — ABNORMAL HIGH (ref 24–336)

## 2020-04-10 LAB — LACTIC ACID, PLASMA
Lactic Acid, Venous: 1.5 mmol/L (ref 0.5–1.9)
Lactic Acid, Venous: 1.4 mmol/L (ref 0.5–1.9)

## 2020-04-10 LAB — LACTATE DEHYDROGENASE: LDH: 472 U/L — ABNORMAL HIGH (ref 98–192)

## 2020-04-10 LAB — FIBRINOGEN: Fibrinogen: 616 mg/dL — ABNORMAL HIGH (ref 210–475)

## 2020-04-10 LAB — D-DIMER, QUANTITATIVE: D-Dimer, Quant: 4.64 ug/mL-FEU — ABNORMAL HIGH (ref 0.00–0.50)

## 2020-04-10 LAB — C-REACTIVE PROTEIN: CRP: 17.7 mg/dL — ABNORMAL HIGH (ref <1.0)

## 2020-04-10 LAB — TRIGLYCERIDES: Triglycerides: 394 mg/dL — ABNORMAL HIGH (ref <150)

## 2020-04-10 IMAGING — DX DG CHEST 1V PORT
1 series · 2 of 2 positions shown · non-contrast
Comparison: Radiograph [DATE]

CLINICAL DATA: Shortness of breath.  COVID positive [DATE].

EXAM:
PORTABLE CHEST 1 VIEW

[Series 1: chest ap · 0.14mm/px · 2 of 2 slices shown]
[im 1/2]
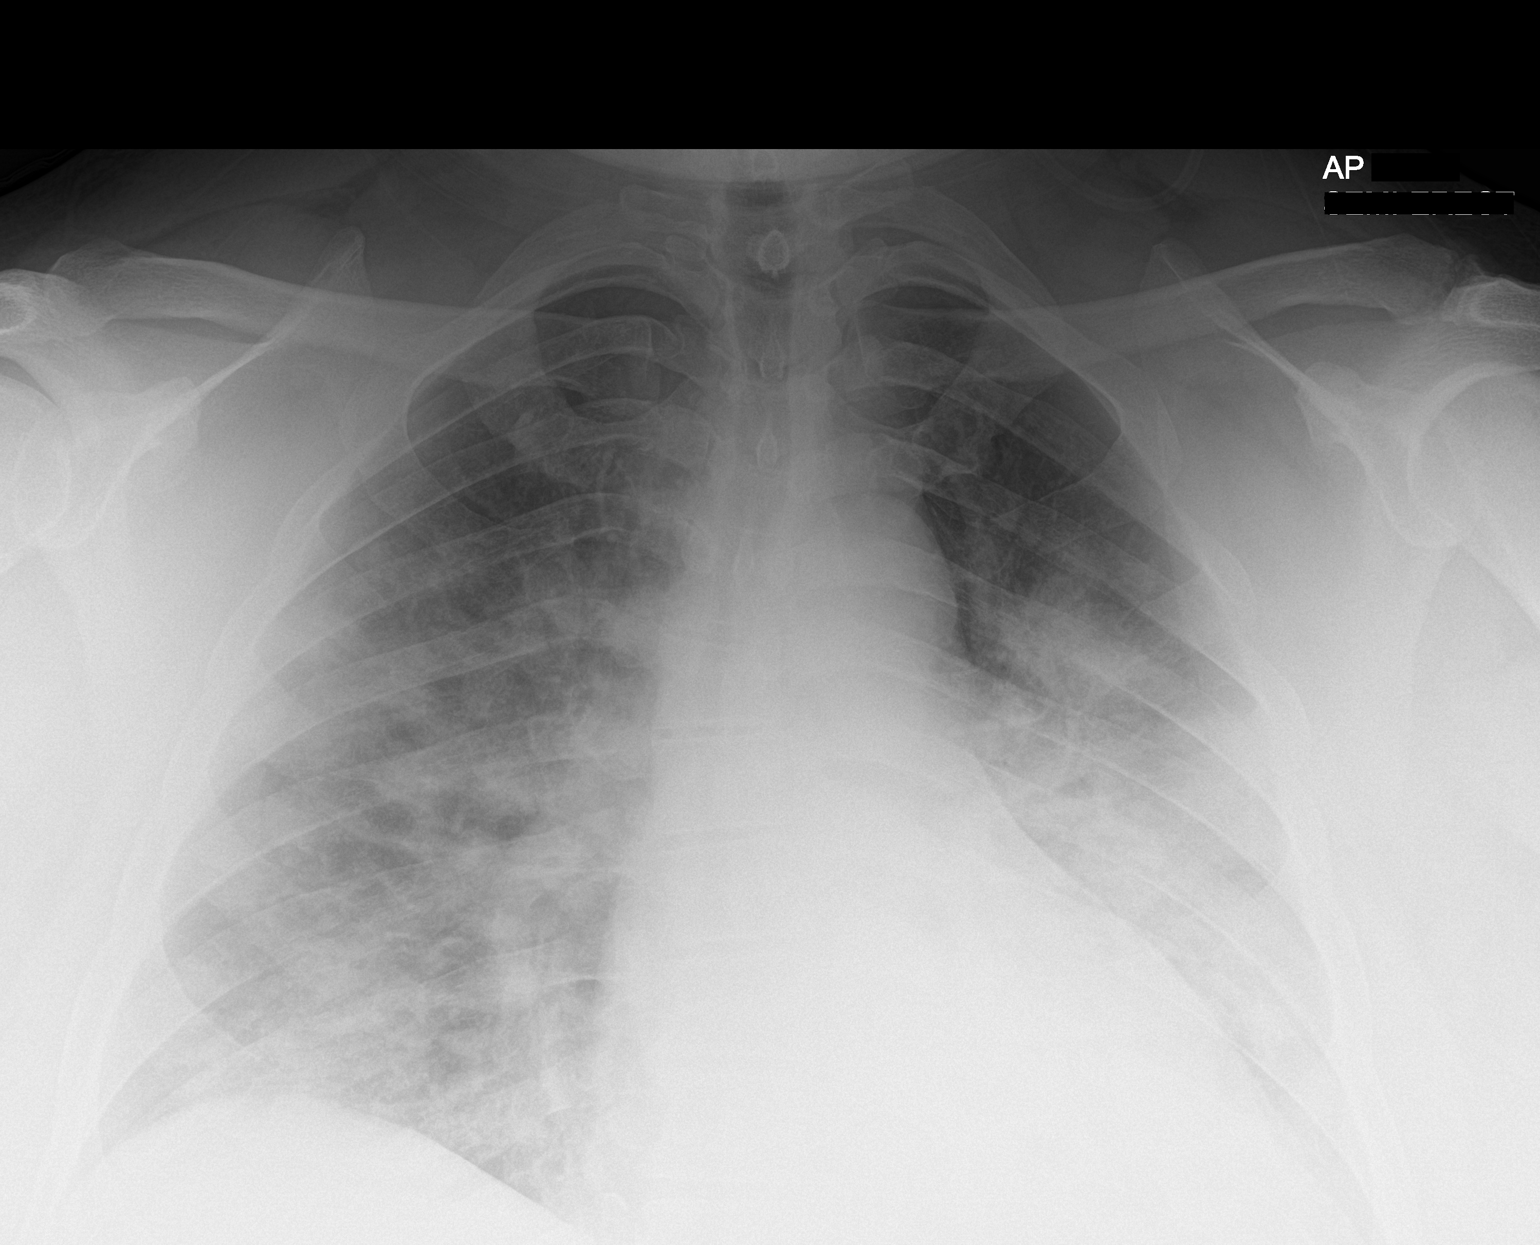
[im 2/2]
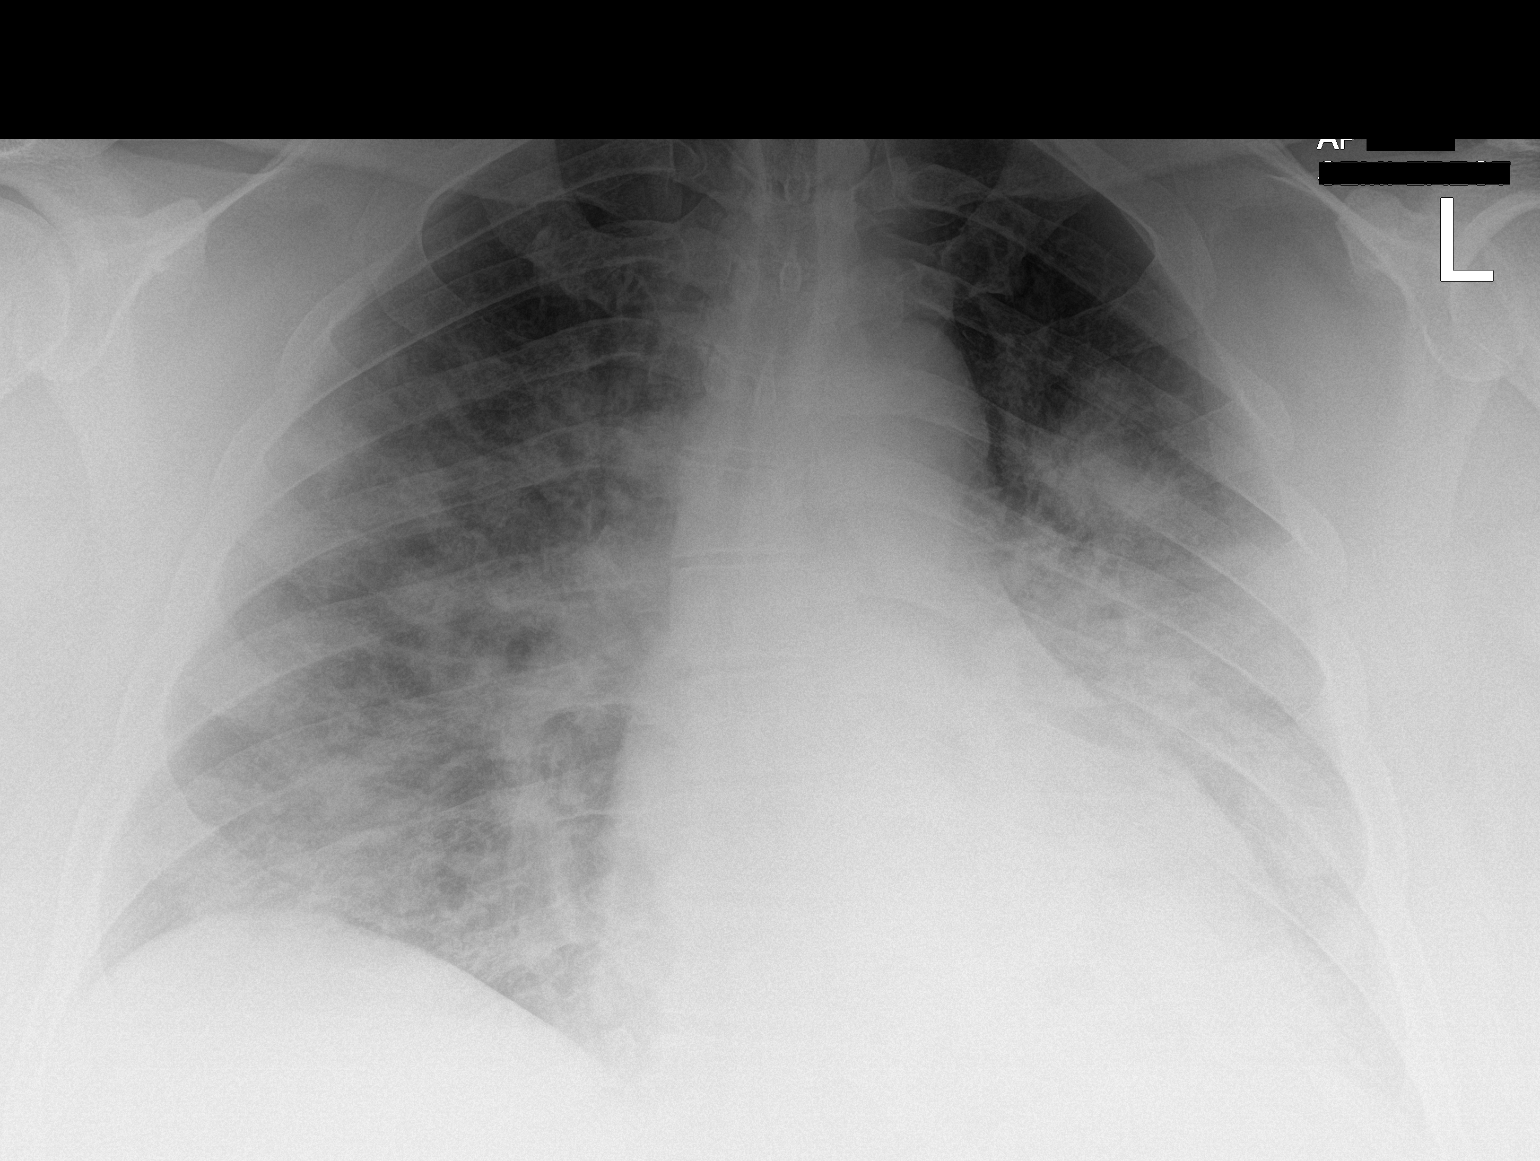

[2 of 2 positions shown; findings below may reference images not displayed]

FINDINGS: Progressive heterogeneous bilateral lung opacities over the past 3
days, moderate parenchymal involvement. Stable borderline
cardiomegaly. Unchanged mediastinal contours. No pneumothorax or
large pleural effusion. Stable osseous structures.
IMPRESSION: Progressive heterogeneous bilateral lung opacities over the past 3
days. Findings consistent with multifocal pneumonia, pattern typical
of [02] with moderate parenchymal involvement.

## 2020-04-10 MED ORDER — SODIUM CHLORIDE 0.9 % IV SOLN
100.0000 mg | INTRAVENOUS | Status: AC
  Administered 2020-04-10 (×2): 100 mg via INTRAVENOUS
  Filled 2020-04-10: qty 20

## 2020-04-10 MED ORDER — SODIUM CHLORIDE 0.9 % IV SOLN
500.0000 mg | INTRAVENOUS | Status: DC
  Administered 2020-04-11 (×2): 500 mg via INTRAVENOUS
  Filled 2020-04-10 (×2): qty 500

## 2020-04-10 MED ORDER — DEXAMETHASONE SODIUM PHOSPHATE 10 MG/ML IJ SOLN
6.0000 mg | INTRAMUSCULAR | Status: DC
  Administered 2020-04-10 – 2020-04-11 (×2): 6 mg via INTRAVENOUS
  Filled 2020-04-10 (×2): qty 1

## 2020-04-10 MED ORDER — SODIUM CHLORIDE 0.9 % IV SOLN: 200.0000 mg | Freq: Once | INTRAVENOUS | Status: DC

## 2020-04-10 MED ORDER — HYDRALAZINE HCL 25 MG PO TABS
25.0000 mg | ORAL_TABLET | Freq: Two times a day (BID) | ORAL | Status: DC
  Administered 2020-04-10 – 2020-04-12 (×4): 25 mg via ORAL
  Filled 2020-04-10 (×4): qty 1

## 2020-04-10 MED ORDER — IPRATROPIUM BROMIDE HFA 17 MCG/ACT IN AERS
2.0000 | INHALATION_SPRAY | Freq: Once | RESPIRATORY_TRACT | Status: AC
  Administered 2020-04-10: 2 via RESPIRATORY_TRACT

## 2020-04-10 MED ORDER — SPIRONOLACTONE 25 MG PO TABS: 25.0000 mg | ORAL_TABLET | Freq: Every day | ORAL | Status: DC

## 2020-04-10 MED ORDER — LABETALOL HCL 200 MG PO TABS
200.0000 mg | ORAL_TABLET | Freq: Two times a day (BID) | ORAL | Status: DC
  Administered 2020-04-10 – 2020-04-11 (×2): 200 mg via ORAL
  Filled 2020-04-10 (×3): qty 1

## 2020-04-10 MED ORDER — ALBUTEROL SULFATE HFA 108 (90 BASE) MCG/ACT IN AERS
2.0000 | INHALATION_SPRAY | Freq: Four times a day (QID) | RESPIRATORY_TRACT | Status: DC
  Administered 2020-04-10 – 2020-04-12 (×6): 2 via RESPIRATORY_TRACT
  Filled 2020-04-10: qty 6.7

## 2020-04-10 MED ORDER — HYDROCOD POLST-CPM POLST ER 10-8 MG/5ML PO SUER: 5.0000 mL | Freq: Two times a day (BID) | ORAL | Status: DC | PRN

## 2020-04-10 MED ORDER — SODIUM CHLORIDE 0.9 % IV SOLN
100.0000 mg | Freq: Every day | INTRAVENOUS | Status: DC
Start: 2020-04-11 — End: 2020-04-10

## 2020-04-10 MED ORDER — TOCILIZUMAB 400 MG/20ML IV SOLN
800.0000 mg | Freq: Once | INTRAVENOUS | Status: AC
  Administered 2020-04-10: 800 mg via INTRAVENOUS
  Filled 2020-04-10: qty 40

## 2020-04-10 MED ORDER — GUAIFENESIN-DM 100-10 MG/5ML PO SYRP: 10.0000 mL | ORAL_SOLUTION | ORAL | Status: DC | PRN

## 2020-04-10 MED ORDER — FOLIC ACID 1 MG PO TABS
1.0000 mg | ORAL_TABLET | Freq: Every day | ORAL | Status: DC
  Administered 2020-04-10 – 2020-04-12 (×3): 1 mg via ORAL
  Filled 2020-04-10 (×3): qty 1

## 2020-04-10 MED ORDER — AMLODIPINE BESYLATE 10 MG PO TABS
10.0000 mg | ORAL_TABLET | Freq: Every day | ORAL | Status: DC
  Administered 2020-04-10 – 2020-04-12 (×3): 10 mg via ORAL
  Filled 2020-04-10 (×3): qty 1

## 2020-04-10 MED ORDER — ONDANSETRON HCL 4 MG/2ML IJ SOLN: 4.0000 mg | Freq: Four times a day (QID) | INTRAMUSCULAR | Status: DC | PRN

## 2020-04-10 MED ORDER — DEXAMETHASONE SODIUM PHOSPHATE 10 MG/ML IJ SOLN
6.0000 mg | Freq: Once | INTRAMUSCULAR | Status: AC
  Administered 2020-04-10: 6 mg via INTRAVENOUS
  Filled 2020-04-10: qty 1

## 2020-04-10 MED ORDER — ADULT MULTIVITAMIN W/MINERALS CH
1.0000 | ORAL_TABLET | Freq: Every day | ORAL | Status: DC
  Administered 2020-04-10 – 2020-04-12 (×3): 1 via ORAL
  Filled 2020-04-10 (×3): qty 1

## 2020-04-10 MED ORDER — FUROSEMIDE 40 MG PO TABS
40.0000 mg | ORAL_TABLET | Freq: Every day | ORAL | Status: DC
  Administered 2020-04-10 – 2020-04-11 (×2): 40 mg via ORAL
  Filled 2020-04-10 (×2): qty 1

## 2020-04-10 MED ORDER — LOSARTAN POTASSIUM 50 MG PO TABS
50.0000 mg | ORAL_TABLET | Freq: Every day | ORAL | Status: DC
  Administered 2020-04-11: 50 mg via ORAL
  Filled 2020-04-10: qty 1

## 2020-04-10 MED ORDER — ALBUTEROL SULFATE HFA 108 (90 BASE) MCG/ACT IN AERS
4.0000 | INHALATION_SPRAY | Freq: Once | RESPIRATORY_TRACT | Status: AC
  Administered 2020-04-10: 4 via RESPIRATORY_TRACT
  Filled 2020-04-10: qty 6.7

## 2020-04-10 MED ORDER — SODIUM CHLORIDE 0.9 % IV SOLN
100.0000 mg | Freq: Every day | INTRAVENOUS | Status: DC
  Administered 2020-04-11 – 2020-04-12 (×2): 100 mg via INTRAVENOUS
  Filled 2020-04-10 (×2): qty 20

## 2020-04-10 MED ORDER — ACETAMINOPHEN 325 MG PO TABS: 650.0000 mg | ORAL_TABLET | Freq: Four times a day (QID) | ORAL | Status: DC | PRN

## 2020-04-10 MED ORDER — ONDANSETRON HCL 4 MG PO TABS: 4.0000 mg | ORAL_TABLET | Freq: Four times a day (QID) | ORAL | Status: DC | PRN

## 2020-04-10 MED ORDER — ENOXAPARIN SODIUM 60 MG/0.6ML ~~LOC~~ SOLN
60.0000 mg | Freq: Every day | SUBCUTANEOUS | Status: DC
  Administered 2020-04-10: 60 mg via SUBCUTANEOUS
  Filled 2020-04-10: qty 0.6

## 2020-04-10 NOTE — ED Provider Notes (Addendum)
[facility] DEPT Provider Note   CSN: [id number] Arrival date & time: 04/10/20  1510     History Chief Complaint  Patient presents with  . COVID Positive  . Shortness of Breath    Calvin Bailey is a 39 y.o. male.  HPI      Calvin Bailey is a 39 y.o. male, with a history of obesity, HTN, GERD, presenting to the ED with shortness of breath worsening over the last several days.  Patient has had body aches, chills, fatigue, and diarrhea since July 5. July 11 had a positive Covid test. Patient was seen in the ED July 12. No hypoxia at that time. Received monoclonal antibody infusion on July 13.  Denies chest pain, abdominal pain, hematochezia/melena, vomiting, acute lower extremity edema, or any other complaints.   Past Medical History:  Diagnosis Date  . Dizziness 09/25/2019  . GERD (gastroesophageal reflux disease)   . HTN (hypertension)   . Obesity hypoventilation syndrome ([place]) 04/08/2014  . OSA (obstructive sleep apnea) 04/08/2014  . Sleep apnea     Patient Active Problem List   Diagnosis Date Noted  . Acute respiratory failure due to COVID-19 ([place]) 04/10/2020  . ARF (acute renal failure) ([place]) 04/10/2020  . Dizziness 09/25/2019  . Green nails 07/31/2019  . Dyspnea on exertion 05/30/2019  . 3+ pitting edema 05/30/2019  . Tinea versicolor 01/04/2019  . Tinea corporis 01/04/2019  . Chronic cough 06/11/2017  . Abnormal CT of the chest 06/11/2017  . Herpes simplex infection of penis 02/15/2017  . Hypogonadism male 07/12/2014  . Erectile dysfunction due to arterial insufficiency 07/12/2014  . Obesity hypoventilation syndrome ([place]) 04/08/2014  . Morbid obesity due to excess calories ([place]) 04/08/2014  . OSA (obstructive sleep apnea) 04/08/2014  . Cigarette smoker 02/23/2011  . Tobacco use disorder 02/23/2011  . GERD (gastroesophageal reflux disease) 02/09/2011  . Essential hypertension 07/21/2009    Past Surgical History:    Procedure Laterality Date  . two knee surgeries Left 1995       Family History  Problem Relation Age of Onset  . Hypertension Mother   . Multiple sclerosis Mother   . Hypertension Father   . Hypertension Brother   . Hyperlipidemia Paternal Grandfather   . Heart failure Paternal Grandfather   . Diabetes Maternal Grandmother   . Heart failure Maternal Grandfather   . CVA Maternal Uncle     Social History   Tobacco Use  . Smoking status: Current Every Day Smoker    Packs/day: 1.00    Years: 16.00    Pack years: 16.00    Types: Cigarettes  . Smokeless tobacco: Never Used  Substance Use Topics  . Alcohol use: Yes    Alcohol/week: 0.0 standard drinks    Comment: sparingly   . Drug use: No    Home Medications Prior to Admission medications   Medication Sig Start Date End Date Taking? Authorizing Provider  amLODipine (NORVASC) 10 MG tablet Take 10 mg by mouth daily. 10/22/19  Yes [provider]  amoxicillin (AMOXIL) 875 MG tablet Take 1 tablet (875 mg total) by mouth 2 (two) times daily for 10 days. 04/06/20 04/16/20 Yes Faustino Congress, NP  furosemide (LASIX) 40 MG tablet TAKE 1 TABLET(40 MG) BY MOUTH DAILY Patient taking differently: Take 40 mg by mouth daily.  09/26/19  Yes Maximiano Coss, NP  hydrALAZINE (APRESOLINE) 25 MG tablet Take 1 tablet (25 mg total) by mouth 2 (two) times daily. PLEASE CALL OFFICE TO  SCHEDULE AN APPOINTMENT FOR FURTHER REFILLS 02/11/20 04/11/20 Yes Tobb, Kardie, DO  labetalol (NORMODYNE) 200 MG tablet Take 1 tablet (200 mg total) by mouth 2 (two) times daily. 08/10/19  Yes Tobb, Kardie, DO  losartan (COZAAR) 50 MG tablet Take 50 mg by mouth daily.   Yes [provider]  spironolactone (ALDACTONE) 25 MG tablet Take 25 mg by mouth daily.   Yes [provider]  amLODipine (NORVASC) 5 MG tablet Take 1 tablet (5 mg total) by mouth daily. 06/01/19 08/30/19  Tobb, Kardie, DO  azithromycin (ZITHROMAX) 250 MG tablet Take by mouth  as directed. Patient not taking: Reported on 04/10/2020 10/20/19   [provider]  carvedilol (COREG) 25 MG tablet Take 25 mg by mouth 2 (two) times daily. Patient not taking: Reported on 04/06/2020 12/21/19   [provider]  ciprofloxacin (CIPRO) 500 MG tablet Take 1 tablet (500 mg total) by mouth 2 (two) times daily. Patient not taking: Reported on 09/25/2019 07/31/19   Maximiano Coss, NP  losartan (COZAAR) 50 MG tablet Take 1 tablet (50 mg total) by mouth daily. 09/04/19 04/06/20  Tobb, Godfrey Pick, DO  oseltamivir (TAMIFLU) 75 MG capsule Take 75 mg by mouth 2 (two) times daily. Patient not taking: Reported on 04/10/2020 09/15/19   [provider]  spironolactone (ALDACTONE) 25 MG tablet Take 0.5 tablets (12.5 mg total) by mouth daily. 07/11/19 04/06/20  Tobb, Kardie, DO  LISINOPRIL-HYDROCHLOROTHIAZIDE PO Take by mouth.  07/06/19  [provider]    Allergies    Chlorthalidone and Lisinopril  Review of Systems   Review of Systems  Constitutional: Positive for chills and fatigue.  Respiratory: Positive for cough and shortness of breath.   Cardiovascular: Negative for chest pain.  Gastrointestinal: Positive for diarrhea. Negative for abdominal pain, blood in stool, nausea and vomiting.  Genitourinary: Negative for difficulty urinating and dysuria.  Musculoskeletal: Positive for myalgias.  Neurological: Positive for weakness (generalized).  All other systems reviewed and are negative.   Physical Exam Updated Vital Signs BP (!) 159/88 (BP Location: Left Arm)   Pulse 78   Temp 98.6 F (37 C) (Oral)   Resp (!) 34   Ht 6' 1.5" (1.867 m)   Wt (!) 140.6 kg   SpO2 (!) 84%   BMI 40.35 kg/m   Physical Exam Vitals and nursing note reviewed.  Constitutional:      General: He is not in acute distress.    Appearance: He is well-developed. He is obese. He is not diaphoretic.  HENT:     Head: Normocephalic and atraumatic.     Mouth/Throat:     Mouth: Mucous  membranes are moist.     Pharynx: Oropharynx is clear.  Eyes:     Conjunctiva/sclera: Conjunctivae normal.  Cardiovascular:     Rate and Rhythm: Normal rate and regular rhythm.     Pulses: Normal pulses.          Radial pulses are 2+ on the right side and 2+ on the left side.       Posterior tibial pulses are 2+ on the right side and 2+ on the left side.     Heart sounds: Normal heart sounds.     Comments: Tactile temperature in the extremities appropriate and equal bilaterally. Pulmonary:     Breath sounds: Normal breath sounds.     Comments: Increased work of breathing and tachypnea with even mild exertion. SPO2 increased to 90% on 2 L supplemental O2 and 95% on 4 L supplemental  O2. Abdominal:     Palpations: Abdomen is soft.     Tenderness: There is no abdominal tenderness. There is no guarding.  Musculoskeletal:     Cervical back: Neck supple.     Right lower leg: No edema.     Left lower leg: No edema.  Lymphadenopathy:     Cervical: No cervical adenopathy.  Skin:    General: Skin is warm and dry.  Neurological:     Mental Status: He is alert.  Psychiatric:        Mood and Affect: Mood and affect normal.        Speech: Speech normal.        Behavior: Behavior normal.     ED Results / Procedures / Treatments   Labs (all labs ordered are listed, but only abnormal results are displayed) Labs Reviewed  COMPREHENSIVE METABOLIC PANEL - Abnormal; Notable for the following components:      Result Value   Glucose, Bld 106 (*)    BUN 31 (*)    Creatinine, Ser 1.99 (*)    Calcium 8.5 (*)    AST 61 (*)    ALT 60 (*)    GFR calc non Af Amer 41 (*)    GFR calc Af Amer 47 (*)    All other components within normal limits  D-DIMER, QUANTITATIVE (NOT AT [facility]) - Abnormal; Notable for the following components:   D-Dimer, Quant 4.64 (*)    All other components within normal limits  LACTATE DEHYDROGENASE - Abnormal; Notable for the following components:   LDH 472 (*)    All  other components within normal limits  FERRITIN - Abnormal; Notable for the following components:   Ferritin 1,202 (*)    All other components within normal limits  TRIGLYCERIDES - Abnormal; Notable for the following components:   Triglycerides 394 (*)    All other components within normal limits  FIBRINOGEN - Abnormal; Notable for the following components:   Fibrinogen 616 (*)    All other components within normal limits  C-REACTIVE PROTEIN - Abnormal; Notable for the following components:   CRP 17.7 (*)    All other components within normal limits  CULTURE, BLOOD (ROUTINE X 2)  CULTURE, BLOOD (ROUTINE X 2)  LACTIC ACID, PLASMA  LACTIC ACID, PLASMA  CBC WITH DIFFERENTIAL/PLATELET  PROCALCITONIN    EKG None  ED ECG REPORT   Date: 04/10/2020  Rate: 75  Rhythm: Sinus rhythm  QRS Axis: normal  Intervals: normal  ST/T Wave abnormalities: normal  Conduction Disutrbances:none  Narrative Interpretation:   Old EKG Reviewed: unchanged  I have personally reviewed the EKG tracing and agree with the computerized printout as noted.          Radiology DG Chest Port 1 View  Result Date: 04/10/2020 CLINICAL DATA:  Shortness of breath.  COVID positive 04/06/2020. EXAM: PORTABLE CHEST 1 VIEW COMPARISON:  Radiograph 04/07/2020 FINDINGS: Progressive heterogeneous bilateral lung opacities over the past 3 days, moderate parenchymal involvement. Stable borderline cardiomegaly. Unchanged mediastinal contours. No pneumothorax or large pleural effusion. Stable osseous structures. IMPRESSION: Progressive heterogeneous bilateral lung opacities over the past 3 days. Findings consistent with multifocal pneumonia, pattern typical of COVID-19 with moderate parenchymal involvement. Electronically Signed   By: Keith Rake M.D.   On: 04/10/2020 17:48    Procedures Procedures (including critical care time)  Medications Ordered in ED Medications  remdesivir 100 mg in sodium chloride 0.9 %  100 mL IVPB (100 mg Intravenous New Bag/Given 04/10/20  1930)  remdesivir 100 mg in sodium chloride 0.9 % 100 mL IVPB (has no administration in time range)  albuterol (VENTOLIN HFA) 108 (90 Base) MCG/ACT inhaler 4 puff (4 puffs Inhalation Given 04/10/20 1711)  ipratropium (ATROVENT HFA) inhaler 2 puff (2 puffs Inhalation Given 04/10/20 1645)  dexamethasone (DECADRON) injection 6 mg (6 mg Intravenous Given 04/10/20 1711)    ED Course  I have reviewed the triage vital signs and the nursing notes.  Pertinent labs & imaging results that were available during my care of the patient were reviewed by me and considered in my medical decision making (see chart for details).  Clinical Course as of Apr 11 2039  Thu Apr 10, 2020  1616 SpO2(!): 84 % [SJ]  1837 Spoke with Dr. Jonelle Sidle, hospitalist. Agrees to admit the patient.   [SJ]    Clinical Course User Index [SJ] Teyah Rossy, Helane Gunther, PA-C   MDM Rules/Calculators/A&P                           Patient presents with complaint of shortness of breath worsening over the last several days in the setting of known Covid infection. Hypoxic down to 84% at rest on room air. Chest x-ray with multifocal pneumonia, consistent with known history of Covid infection.  Less suspicion for bacterial pneumonia.  Less suspicion for complicating pathology, such as PE. I personally reviewed and interpreted the patient's labs and imaging studies. Due to his level of hypoxia, patient could benefit from admission.  Findings and plan of care discussed with Lennice Sites, DO. Dr. Ronnald Nian personally evaluated and examined this patient.  Calvin Bailey was evaluated in Emergency Department on 04/10/2020 for the symptoms described in the history of present illness. He was evaluated in the context of the global COVID-19 pandemic, which necessitated consideration that the patient might be at risk for infection with the SARS-CoV-2 virus that causes COVID-19. Institutional protocols and  algorithms that pertain to the evaluation of patients at risk for COVID-19 are in a state of rapid change based on information released by regulatory bodies including the CDC and federal and state organizations. These policies and algorithms were followed during the patient's care in the ED.  Vitals:   04/10/20 1558 04/10/20 1700 04/10/20 1730 04/10/20 1814  BP: (!) 159/88 (!) 147/89 (!) 185/96 (!) 179/74  Pulse: 78 70 72 60  Resp: (!) 34 (!) 26 20 (!) 22  Temp: 98.6 F (37 C)     TempSrc: Oral     SpO2: (!) 84% 95% 95% 95%  Weight: (!) 140.6 kg     Height: 6' 1.5" (1.867 m)       Final Clinical Impression(s) / ED Diagnoses Final diagnoses:  Acute respiratory failure with hypoxia [facility])  COVID-19    Rx / DC Orders ED Discharge Orders    None       Layla Maw 04/10/20 2041    Lorayne Bender, PA-C 04/10/20 2041    Lennice Sites, DO 04/10/20 2219

## 2020-04-10 NOTE — ED Notes (Signed)
Phlebotomy called for blood cultures at 1715 per [name], PA

## 2020-04-10 NOTE — H&P (Signed)
History and Physical   Calvin Bailey YPP:[id number] DOB: 07-Aug-1981 DOA: 04/10/2020  Referring MD/NP/PA: Dr. Ronnald Nian  PCP: Maximiano Coss, NP   Outpatient Specialists: None  Patient coming from: Home  Chief Complaint: Shortness of breath and cough  HPI: Calvin Bailey is a 39 y.o. male with medical history significant of hypertension, obstructive sleep apnea, obesity hypoventilation syndrome, GERD, who was seen in the ER today as a bounce back after being seen 3 days ago with COVID-19 pneumonia.  Patient symptoms started on July 5 and was diagnosed with COVID-19.  He received outpatient IVIG.  He has since gradually gotten worse with sore throat nasal congestion fever and persistent dry cough.  He was seen in urgent care center on the 11th.  That was when he was told his Covid test was positive and sent to the ER the next day.  Since yesterday he has progressively gotten worse more shortness of breath more fever and return to the ER today.  He was found to be hypoxic on room air oxygen sats in the upper 80s at rest and with mobility it drops further.  Chest x-ray showed bilateral infiltrates.  Findings are consistent with COVID-19 pneumonia and patient is being admitted to the hospital for further evaluation.  Denied any known contact..  ED Course: Temperature 98.6 blood pressure 147/89 pulse 70 respirate 34 oxygen sats 84% on room air.  CBC appears largely within normal chemistry showed BUN of 31 creatinine 1.9 and calcium 8.5 glucose 106.  Lactic acid is 1.5.  His D-dimer is 4.64 glucose 106.  LDH 472 triglyceride 394.  Ferritin is 09/28/2000 procalcitonin is negative.  Chest x-ray shows progressive heterogeneous bilateral lung opacities consistent with multifocal pneumonia due to Covid.  Patient initiated on treatment and being admitted to the hospital for treatment.  Review of Systems: As per HPI otherwise 10 point review of systems negative.    Past Medical History:  Diagnosis Date  .  Dizziness 09/25/2019  . GERD (gastroesophageal reflux disease)   . HTN (hypertension)   . Obesity hypoventilation syndrome ([place]) 04/08/2014  . OSA (obstructive sleep apnea) 04/08/2014  . Sleep apnea     Past Surgical History:  Procedure Laterality Date  . two knee surgeries Left 1995     reports that he has been smoking cigarettes. He has a 16.00 pack-year smoking history. He has never used smokeless tobacco. He reports current alcohol use. He reports that he does not use drugs.  Allergies  Allergen Reactions  . Chlorthalidone Nausea Only  . Lisinopril Cough    Family History  Problem Relation Age of Onset  . Hypertension Mother   . Multiple sclerosis Mother   . Hypertension Father   . Hypertension Brother   . Hyperlipidemia Paternal Grandfather   . Heart failure Paternal Grandfather   . Diabetes Maternal Grandmother   . Heart failure Maternal Grandfather   . CVA Maternal Uncle      Prior to Admission medications   Medication Sig Start Date End Date Taking? Authorizing Provider  amLODipine (NORVASC) 10 MG tablet Take 10 mg by mouth daily. 10/22/19  Yes [provider]  amoxicillin (AMOXIL) 875 MG tablet Take 1 tablet (875 mg total) by mouth 2 (two) times daily for 10 days. 04/06/20 04/16/20 Yes Faustino Congress, NP  furosemide (LASIX) 40 MG tablet TAKE 1 TABLET(40 MG) BY MOUTH DAILY Patient taking differently: Take 40 mg by mouth daily.  09/26/19  Yes Maximiano Coss, NP  hydrALAZINE (APRESOLINE) 25 MG  tablet Take 1 tablet (25 mg total) by mouth 2 (two) times daily. PLEASE CALL OFFICE TO SCHEDULE AN APPOINTMENT FOR FURTHER REFILLS 02/11/20 04/11/20 Yes Tobb, Kardie, DO  labetalol (NORMODYNE) 200 MG tablet Take 1 tablet (200 mg total) by mouth 2 (two) times daily. 08/10/19  Yes Tobb, Kardie, DO  losartan (COZAAR) 50 MG tablet Take 50 mg by mouth daily.   Yes [provider]  spironolactone (ALDACTONE) 25 MG tablet Take 25 mg by mouth daily.   Yes [provider]  amLODipine (NORVASC) 5 MG tablet Take 1 tablet (5 mg total) by mouth daily. 06/01/19 08/30/19  Tobb, Kardie, DO  azithromycin (ZITHROMAX) 250 MG tablet Take by mouth as directed. Patient not taking: Reported on 04/10/2020 10/20/19   [provider]  carvedilol (COREG) 25 MG tablet Take 25 mg by mouth 2 (two) times daily. Patient not taking: Reported on 04/06/2020 12/21/19   [provider]  ciprofloxacin (CIPRO) 500 MG tablet Take 1 tablet (500 mg total) by mouth 2 (two) times daily. Patient not taking: Reported on 09/25/2019 07/31/19   Maximiano Coss, NP  losartan (COZAAR) 50 MG tablet Take 1 tablet (50 mg total) by mouth daily. 09/04/19 04/06/20  Tobb, Godfrey Pick, DO  oseltamivir (TAMIFLU) 75 MG capsule Take 75 mg by mouth 2 (two) times daily. Patient not taking: Reported on 04/10/2020 09/15/19   [provider]  spironolactone (ALDACTONE) 25 MG tablet Take 0.5 tablets (12.5 mg total) by mouth daily. 07/11/19 04/06/20  Tobb, Kardie, DO  LISINOPRIL-HYDROCHLOROTHIAZIDE PO Take by mouth.  07/06/19  [provider]    Physical Exam: Vitals:   04/10/20 1558 04/10/20 1700 04/10/20 1730 04/10/20 1814  BP: (!) 159/88 (!) 147/89 (!) 185/96 (!) 179/74  Pulse: 78 70 72 60  Resp: (!) 34 (!) 26 20 (!) 22  Temp: 98.6 F (37 C)     TempSrc: Oral     SpO2: (!) 84% 95% 95% 95%  Weight: (!) 140.6 kg     Height: 6' 1.5" (1.867 m)         Constitutional: Acutely ill looking, slightly tachypneic Vitals:   04/10/20 1558 04/10/20 1700 04/10/20 1730 04/10/20 1814  BP: (!) 159/88 (!) 147/89 (!) 185/96 (!) 179/74  Pulse: 78 70 72 60  Resp: (!) 34 (!) 26 20 (!) 22  Temp: 98.6 F (37 C)     TempSrc: Oral     SpO2: (!) 84% 95% 95% 95%  Weight: (!) 140.6 kg     Height: 6' 1.5" (1.867 m)      Eyes: PERRL, lids and conjunctivae normal ENMT: Mucous membranes are moist. Posterior pharynx clear of any exudate or lesions.Normal dentition.  Neck: normal, supple, no  masses, no thyromegaly Respiratory: Decreased air entry bilaterally, coarse breath sound, diffuse rhonchi, no wheeze, no accessory muscle use.  Cardiovascular: Sinus tachycardia, no murmurs / rubs / gallops. No extremity edema. 2+ pedal pulses. No carotid bruits.  Abdomen: no tenderness, no masses palpated. No hepatosplenomegaly. Bowel sounds positive.  Musculoskeletal: no clubbing / cyanosis. No joint deformity upper and lower extremities. Good ROM, no contractures. Normal muscle tone.  Skin: no rashes, lesions, ulcers. No induration Neurologic: CN 2-12 grossly intact. Sensation intact, DTR normal. Strength 5/5 in all 4.  Psychiatric: Normal judgment and insight. Alert and oriented x 3. Normal mood.     Labs on Admission: I have personally reviewed following labs and imaging studies  CBC: Recent Labs  Lab 04/10/20 1700  WBC 5.7  NEUTROABS  3.6  HGB 15.6  HCT 46.4  MCV 87.1  PLT 629   Basic Metabolic Panel: Recent Labs  Lab 04/10/20 1700  NA 139  K 4.3  CL 104  CO2 23  GLUCOSE 106*  BUN 31*  CREATININE 1.99*  CALCIUM 8.5*   GFR: Estimated Creatinine Clearance: 73.2 mL/min (A) (by C-G formula based on SCr of 1.99 mg/dL (H)). Liver Function Tests: Recent Labs  Lab 04/10/20 1700  AST 61*  ALT 60*  ALKPHOS 71  BILITOT 0.7  PROT 7.8  ALBUMIN 3.6   No results for input(s): LIPASE, AMYLASE in the last 168 hours. No results for input(s): AMMONIA in the last 168 hours. Coagulation Profile: No results for input(s): INR, PROTIME in the last 168 hours. Cardiac Enzymes: No results for input(s): CKTOTAL, CKMB, CKMBINDEX, TROPONINI in the last 168 hours. BNP (last 3 results) No results for input(s): PROBNP in the last 8760 hours. HbA1C: No results for input(s): HGBA1C in the last 72 hours. CBG: No results for input(s): GLUCAP in the last 168 hours. Lipid Profile: Recent Labs    04/10/20 1700  TRIG 394*   Thyroid Function Tests: No results for input(s): TSH,  T4TOTAL, FREET4, T3FREE, THYROIDAB in the last 72 hours. Anemia Panel: Recent Labs    04/10/20 1700  FERRITIN 1,202*   Urine analysis:    Component Value Date/Time   COLORURINE YELLOW 07/02/2017 2134   APPEARANCEUR CLEAR 07/02/2017 2134   LABSPEC 1.021 07/02/2017 2134   PHURINE 6.0 07/02/2017 2134   GLUCOSEU NEGATIVE 07/02/2017 2134   HGBUR NEGATIVE 07/02/2017 2134   BILIRUBINUR NEGATIVE 07/02/2017 2134   BILIRUBINUR negative 05/27/2017 1118   BILIRUBINUR neg 01/28/2015 1838   KETONESUR NEGATIVE 07/02/2017 2134   PROTEINUR NEGATIVE 07/02/2017 2134   UROBILINOGEN 1.0 05/27/2017 1118   UROBILINOGEN 0.2 06/08/2014 2258   NITRITE NEGATIVE 07/02/2017 2134   LEUKOCYTESUR NEGATIVE 07/02/2017 2134   Sepsis Labs: @LABRCNTIP (procalcitonin:4,lacticidven:4) ) Recent Results (from the past 240 hour(s))  Culture, group A strep (throat)     Status: None   Collection Time: 04/06/20  2:41 PM   Specimen: Throat  Result Value Ref Range Status   Specimen Description THROAT  Final   Special Requests NONE  Final   Culture   Final    NO GROUP A STREP (S.PYOGENES) ISOLATED Performed at [facility], [place] [address], [place], [place] [address]    Report Status 04/09/2020 FINAL  Final  SARS CORONAVIRUS 2 (TAT 6-24 HRS) Nasopharyngeal Nasopharyngeal Swab     Status: Abnormal   Collection Time: 04/06/20  6:45 PM   Specimen: Nasopharyngeal Swab  Result Value Ref Range Status   SARS Coronavirus 2 POSITIVE (A) NEGATIVE Final    Comment: EMAILED [place] BY MESSAN ON 04/07/2020 (NOTE) SARS-CoV-2 target nucleic acids are DETECTED.  The SARS-CoV-2 RNA is generally detectable in upper and lower respiratory specimens during the acute phase of infection. Positive results are indicative of the presence of SARS-CoV-2 RNA. Clinical correlation with patient history and other diagnostic information is  necessary to determine patient infection status. Positive results do not rule out  bacterial infection or co-infection with other viruses.  The expected result is Negative.  Fact Sheet for Patients: SugarRoll.be  Fact Sheet for Healthcare Providers: https://www.woods-mathews.com/  This test is not yet approved or cleared by the [place] FDA and  has been authorized for detection and/or diagnosis of SARS-CoV-2 by FDA under an Emergency Use Authorization (EUA). This EUA will remain  in effect (meaning this test can be used) for the duration of the COVID-19 de claration under Section 564(b)(1) of the Act, 21 U.S.C. section 360bbb-3(b)(1), unless the authorization is terminated or revoked sooner.   Performed at [facility], [place] [address] [place], [place] [address]      Radiological Exams on Admission: DG Chest Port 1 View  Result Date: 04/10/2020 CLINICAL DATA:  Shortness of breath.  COVID positive 04/06/2020. EXAM: PORTABLE CHEST 1 VIEW COMPARISON:  Radiograph 04/07/2020 FINDINGS: Progressive heterogeneous bilateral lung opacities over the past 3 days, moderate parenchymal involvement. Stable borderline cardiomegaly. Unchanged mediastinal contours. No pneumothorax or large pleural effusion. Stable osseous structures. IMPRESSION: Progressive heterogeneous bilateral lung opacities over the past 3 days. Findings consistent with multifocal pneumonia, pattern typical of COVID-19 with moderate parenchymal involvement. Electronically Signed   By: Keith Rake M.D.   On: 04/10/2020 17:48      Assessment/Plan Principal Problem:   Acute respiratory failure due to COVID-19 [facility]) Active Problems:   Essential hypertension   GERD (gastroesophageal reflux disease)   Cigarette smoker   Obesity hypoventilation syndrome ([place])   Morbid obesity due to excess calories ([facility])   OSA (obstructive sleep apnea)   ARF (acute renal failure) ([place])     #1  Acute respiratory failure secondary to COVID-19 pneumonia: Patient will be  admitted to telemetry bed.  Aggressive pulmonary toileting.  Initiate remdesivir, Actemra, Decadron, vitamins as well as breathing treatments.  We will follow the Covid protocol.  #2  Acute kidney injury: Suspected prerenal due to poor oral intake.  Will give mild fluids but aim at drying lungs due to Covid.  #3 obstructive sleep apnea: CPAP as tolerated.  Patient may even get BiPAP if needed at this point no indication.  #4 essential hypertension: Continue with home regimen.  #5 GERD: Continue with PPIs  #6 tobacco abuse: Counseling provided.  Will offer nicotine patch  #7 morbid obesity: Dietary counseling   DVT prophylaxis: Lovenox Code Status: Full code Family Communication: No family at bedside Disposition Plan: Home Consults called: None Admission status: Inpatient  Severity of Illness: The appropriate patient status for this patient is INPATIENT. Inpatient status is judged to be reasonable and necessary in order to provide the required intensity of service to ensure the patient's safety. The patient's presenting symptoms, physical exam findings, and initial radiographic and laboratory data in the context of their chronic comorbidities is felt to place them at high risk for further clinical deterioration. Furthermore, it is not anticipated that the patient will be medically stable for discharge from the hospital within 2 midnights of admission. The following factors support the patient status of inpatient.   " The patient's presenting symptoms include shortness of breath and cough. " The worrisome physical exam findings include bilateral crackles and rails. " The initial radiographic and laboratory data are worrisome because of evidence of Covid pneumonia. " The chronic co-morbidities include hypertension and morbid obesity.   * I certify that at the point of admission it is my clinical judgment that the patient will require inpatient hospital care spanning beyond 2 midnights  from the point of admission due to high intensity of service, high risk for further deterioration and high frequency of surveillance required.Barbette Merino MD [facility] Pager [phone]  If 7PM-7AM, please contact night-coverage [email or web address] Password [facility]  04/10/2020, 6:49 PM

## 2020-04-10 NOTE — ED Triage Notes (Signed)
Pt reports testing positive for COVID on 7/11. [place] is increasingly Sh[place]. Patient's O2 saturation is low at this time.

## 2020-04-11 ENCOUNTER — Encounter (HOSPITAL_COMMUNITY): Payer: Self-pay | Admitting: Internal Medicine

## 2020-04-11 ENCOUNTER — Inpatient Hospital Stay (HOSPITAL_COMMUNITY): Payer: 59

## 2020-04-11 DIAGNOSIS — R609 Edema, unspecified: Secondary | ICD-10-CM

## 2020-04-11 LAB — CBC WITH DIFFERENTIAL/PLATELET
Abs Immature Granulocytes: 0.12 10*3/uL — ABNORMAL HIGH (ref 0.00–0.07)
Basophils Absolute: 0 10*3/uL (ref 0.0–0.1)
Basophils Relative: 1 %
Eosinophils Absolute: 0 10*3/uL (ref 0.0–0.5)
Eosinophils Relative: 0 %
HCT: 48 % (ref 39.0–52.0)
Hemoglobin: 15.9 g/dL (ref 13.0–17.0)
Immature Granulocytes: 3 %
Lymphocytes Relative: 19 %
Lymphs Abs: 0.7 10*3/uL (ref 0.7–4.0)
MCH: 28.9 pg (ref 26.0–34.0)
MCHC: 33.1 g/dL (ref 30.0–36.0)
MCV: 87.1 fL (ref 80.0–100.0)
Monocytes Absolute: 0.1 10*3/uL (ref 0.1–1.0)
Monocytes Relative: 4 %
Neutro Abs: 2.7 10*3/uL (ref 1.7–7.7)
Neutrophils Relative %: 73 %
Platelets: 253 10*3/uL (ref 150–400)
RBC: 5.51 MIL/uL (ref 4.22–5.81)
RDW: 12.8 % (ref 11.5–15.5)
WBC: 3.6 10*3/uL — ABNORMAL LOW (ref 4.0–10.5)
nRBC: 0 % (ref 0.0–0.2)

## 2020-04-11 LAB — COMPREHENSIVE METABOLIC PANEL
ALT: 65 U/L — ABNORMAL HIGH (ref 0–44)
AST: 58 U/L — ABNORMAL HIGH (ref 15–41)
Albumin: 3.6 g/dL (ref 3.5–5.0)
Alkaline Phosphatase: 79 U/L (ref 38–126)
Anion gap: 10 (ref 5–15)
BUN: 31 mg/dL — ABNORMAL HIGH (ref 6–20)
CO2: 22 mmol/L (ref 22–32)
Calcium: 8.8 mg/dL — ABNORMAL LOW (ref 8.9–10.3)
Chloride: 104 mmol/L (ref 98–111)
Creatinine, Ser: 1.71 mg/dL — ABNORMAL HIGH (ref 0.61–1.24)
GFR calc Af Amer: 57 mL/min — ABNORMAL LOW (ref >60)
GFR calc non Af Amer: 49 mL/min — ABNORMAL LOW (ref >60)
Glucose, Bld: 145 mg/dL — ABNORMAL HIGH (ref 70–99)
Potassium: 5.6 mmol/L — ABNORMAL HIGH (ref 3.5–5.1)
Sodium: 136 mmol/L (ref 135–145)
Total Bilirubin: 0.4 mg/dL (ref 0.3–1.2)
Total Protein: 8.1 g/dL (ref 6.5–8.1)

## 2020-04-11 LAB — FERRITIN: Ferritin: 924 ng/mL — ABNORMAL HIGH (ref 24–336)

## 2020-04-11 LAB — PHOSPHORUS: Phosphorus: 3.2 mg/dL (ref 2.5–4.6)

## 2020-04-11 LAB — C-REACTIVE PROTEIN: CRP: 12 mg/dL — ABNORMAL HIGH (ref <1.0)

## 2020-04-11 LAB — D-DIMER, QUANTITATIVE: D-Dimer, Quant: 3.1 ug/mL-FEU — ABNORMAL HIGH (ref 0.00–0.50)

## 2020-04-11 LAB — MAGNESIUM: Magnesium: 2.6 mg/dL — ABNORMAL HIGH (ref 1.7–2.4)

## 2020-04-11 LAB — HIV ANTIBODY (ROUTINE TESTING W REFLEX): HIV Screen 4th Generation wRfx: NONREACTIVE

## 2020-04-11 MED ORDER — ENOXAPARIN SODIUM 80 MG/0.8ML ~~LOC~~ SOLN: 70.0000 mg | Freq: Every day | SUBCUTANEOUS | Status: DC

## 2020-04-11 MED ORDER — ENOXAPARIN SODIUM 150 MG/ML ~~LOC~~ SOLN: 140.0000 mg | Freq: Every day | SUBCUTANEOUS | Status: DC

## 2020-04-11 MED ORDER — ORAL CARE MOUTH RINSE
15.0000 mL | Freq: Two times a day (BID) | OROMUCOSAL | Status: DC
  Administered 2020-04-11 (×2): 15 mL via OROMUCOSAL

## 2020-04-11 MED ORDER — LABETALOL HCL 200 MG PO TABS
200.0000 mg | ORAL_TABLET | Freq: Two times a day (BID) | ORAL | Status: DC
  Administered 2020-04-11 – 2020-04-12 (×2): 200 mg via ORAL
  Filled 2020-04-11 (×2): qty 1

## 2020-04-11 MED ORDER — ENOXAPARIN SODIUM 150 MG/ML ~~LOC~~ SOLN
140.0000 mg | Freq: Two times a day (BID) | SUBCUTANEOUS | Status: DC
  Administered 2020-04-11 (×2): 140 mg via SUBCUTANEOUS
  Filled 2020-04-11 (×3): qty 0.94

## 2020-04-11 MED ORDER — CHLORHEXIDINE GLUCONATE 0.12 % MT SOLN
15.0000 mL | Freq: Two times a day (BID) | OROMUCOSAL | Status: DC
  Administered 2020-04-11 – 2020-04-12 (×2): 15 mL via OROMUCOSAL
  Filled 2020-04-11 (×2): qty 15

## 2020-04-11 NOTE — Progress Notes (Signed)
PROGRESS NOTE  Calvin Bailey  BSJ:[id number] DOB: 03-04-1981 DOA: 04/10/2020 PCP: Maximiano Coss, NP  Brief Narrative:  47 AAM known hypertensive, low testosterone on testosterone gel, reflux, OSA on CPAP followed by Dr. Brett Fairy of neurology Prior smoker dyslipidemia Uncontrolled blood pressure followed by cardiologist Dr. Berniece Salines Presented to [facility] long ED after having symptoms of shortness of breath since 03/31/2020 when he was diagnosed with COVID-19 Patient received outpatient IVIG-progressively worsened went to see urgent care 04/06/2020 and ultimately because of worsening shortness of breath came in to the hospital on 7/15 a.m. CXR = bilateral infiltrates O2 sats 84% Dimer 4.6 Lactic acid 1.5 Procalcitonin negative BUN/creatinine 31/1.9   Assessment & Plan:   Principal Problem:   Acute respiratory failure due to COVID-19 [facility]) Active Problems:   Essential hypertension   GERD (gastroesophageal reflux disease)   Cigarette smoker   Obesity hypoventilation syndrome ([place])   Morbid obesity due to excess calories ([facility])   OSA (obstructive sleep apnea)   ARF (acute renal failure) ([place])   1. COVID-19 a. Covid pathway remdesivir/steroids/oxygen/prone 16 hours a day b. Dimer elevated increased Lovenox to 1 mg/kg dosing c. Because of his hypoxia it appears he was given 800 mg of Actemra on admission in addition d. He is on azithromycin and if he does not have sputum I would discontinue this 7/17 as this is probably related to his Covid e. Doppler ultrasound reordered 2. OSA on CPAP  a. cannot use CPAP (aerosolizing) b. Will continue oxygen at night 3. Dyslipidemia 4. Prior smoker 5. Uncontrolled hypertension a. Hyperkalemia likely secondary to Aldactone which has been held--Cozaar 50 mg reordered however b. Would discontinue Cozaar in favor of Coreg 25 twice daily would also hold Lasix at this time c. Continue amlodipine 10 6. Low testosterone a. Outpatient follow-up  with either PCP or urologist b. Current evidence does not support testosterone replacement and in the setting of Covid with an elevated D-dimer I would hesitate to resume his meds 7. Reflux  DVT prophylaxis: Treatment doses of Lovenox until DVT ultrasound is obtained Code Status: Full Family Communication:  None currently disposition: Inpatient  Status is: Inpatient  Remains inpatient appropriate because:Inpatient level of care appropriate due to severity of illness   Dispo: The patient is from: Home              Anticipated d/c is to: Home              Anticipated d/c date is: 1 day              Patient currently is not medically stable to d/c.       Consultants:   None  Procedures: None  Antimicrobials: No   Subjective: Patient found wandering the halls wearing a cloth mask-patient encouraged to come back to her room where we had a long chat about him potentially being an infectious source to other patients-he understood and is willing to stay in the hospital for further work-up given dimer slightly elevated Altogether however looks really good-not requiring oxygen and not having chest pain or fever  Objective: Vitals:   04/11/20 0221 04/11/20 0543 04/11/20 0834 04/11/20 1008  BP: (!) 147/95 (!) 149/83 (!) 161/99   Pulse: 73 69 73   Resp: 19 19 18    Temp: 98.1 F (36.7 C) 98.3 F (36.8 C) 98.3 F (36.8 C)   TempSrc: Oral Oral Oral   SpO2: 96% 92% 91% 94%  Weight:      Height:  Intake/Output Summary (Last 24 hours) at 04/11/2020 1257 Last data filed at 04/11/2020 7829 Gross per 24 hour  Intake 350 ml  Output --  Net 350 ml   Filed Weights   04/10/20 1558  Weight: (!) 140.6 kg    Examination:  General exam: Obese Mallampati 4 no JVD no bruit Respiratory system: Decreased air entry posterolaterally however no wheeze rales rhonchi Cardiovascular system: S1-S2 no murmur rub gallop Gastrointestinal system: Soft nontender no rebound. Central  nervous system: Neurologically intact no focal deficit Extremities: No lower extremity edema Skin: No rash Psychiatry: Euthymic pleasant  Data Reviewed: I have personally reviewed following labs and imaging studies COVID-19 Labs  Recent Labs    04/10/20 1700 04/11/20 0357 04/11/20 1423  DDIMER 4.64*  --  3.10*  FERRITIN 1,202* 924*  --   LDH 472*  --   --   CRP 17.7* 12.0*  --    WBC down to 3.6 Potassium 5.6 BUN/creatinine down from 31/1.99-->31/1.7 Magnesium 2.6 AST/ALT 61/60-->58/65   Lab Results  Component Value Date   SARSCOV2NAA POSITIVE (A) 04/06/2020     Radiology Studies: [facility] Chest Port 1 View  Result Date: 04/10/2020 CLINICAL DATA:  Shortness of breath.  COVID positive 04/06/2020. EXAM: PORTABLE CHEST 1 VIEW COMPARISON:  Radiograph 04/07/2020 FINDINGS: Progressive heterogeneous bilateral lung opacities over the past 3 days, moderate parenchymal involvement. Stable borderline cardiomegaly. Unchanged mediastinal contours. No pneumothorax or large pleural effusion. Stable osseous structures. IMPRESSION: Progressive heterogeneous bilateral lung opacities over the past 3 days. Findings consistent with multifocal pneumonia, pattern typical of COVID-19 with moderate parenchymal involvement. Electronically Signed   By: Keith Rake M.D.   On: 04/10/2020 17:48     Scheduled Meds: . albuterol  2 puff Inhalation Q6H  . amLODipine  10 mg Oral Daily  . chlorhexidine  15 mL Mouth Rinse BID  . dexamethasone (DECADRON) injection  6 mg Intravenous Q24H  . enoxaparin (LOVENOX) injection  140 mg Subcutaneous Q12H  . folic acid  1 mg Oral Daily  . furosemide  40 mg Oral Daily  . hydrALAZINE  25 mg Oral BID  . labetalol  200 mg Oral BID  . losartan  50 mg Oral Daily  . mouth rinse  15 mL Mouth Rinse q12n4p  . multivitamin with minerals  1 tablet Oral Daily   Continuous Infusions: . azithromycin Stopped (04/11/20 0208)  . remdesivir 100 mg in NS 100 mL 100 mg (04/11/20  0909)     LOS: 1 day    Time spent: [place], MD [facility] To contact the attending provider between 7A-7P or the covering provider during after hours 7P-7A, please log into the web site [email or web address] and access using universal [place] password for that web site. If you do not have the password, please call the hospital operator.  04/11/2020, 12:57 PM

## 2020-04-11 NOTE — Progress Notes (Signed)
Pt has order for CPAP qhs.  No CPAP provided by hospital while COVID-19 positive.  Elevate Pt head of bed and provide supplemental O2 via nasal cannula if needed.

## 2020-04-11 NOTE — Progress Notes (Signed)
Lower extremity venous has been completed.   Preliminary results in CV Proc.   Abram Sander 04/11/2020 4:21 PM

## 2020-04-12 LAB — C-REACTIVE PROTEIN: CRP: 5.5 mg/dL — ABNORMAL HIGH (ref <1.0)

## 2020-04-12 LAB — COMPREHENSIVE METABOLIC PANEL
ALT: 67 U/L — ABNORMAL HIGH (ref 0–44)
AST: 45 U/L — ABNORMAL HIGH (ref 15–41)
Albumin: 3.7 g/dL (ref 3.5–5.0)
Alkaline Phosphatase: 67 U/L (ref 38–126)
Anion gap: 14 (ref 5–15)
BUN: 41 mg/dL — ABNORMAL HIGH (ref 6–20)
CO2: 20 mmol/L — ABNORMAL LOW (ref 22–32)
Calcium: 9.1 mg/dL (ref 8.9–10.3)
Chloride: 103 mmol/L (ref 98–111)
Creatinine, Ser: 1.93 mg/dL — ABNORMAL HIGH (ref 0.61–1.24)
GFR calc Af Amer: 49 mL/min — ABNORMAL LOW (ref >60)
GFR calc non Af Amer: 42 mL/min — ABNORMAL LOW (ref >60)
Glucose, Bld: 135 mg/dL — ABNORMAL HIGH (ref 70–99)
Potassium: 5.1 mmol/L (ref 3.5–5.1)
Sodium: 137 mmol/L (ref 135–145)
Total Bilirubin: 0.5 mg/dL (ref 0.3–1.2)
Total Protein: 7.8 g/dL (ref 6.5–8.1)

## 2020-04-12 LAB — CBC WITH DIFFERENTIAL/PLATELET
Abs Immature Granulocytes: 0.57 10*3/uL — ABNORMAL HIGH (ref 0.00–0.07)
Basophils Absolute: 0.1 10*3/uL (ref 0.0–0.1)
Basophils Relative: 1 %
Eosinophils Absolute: 0 10*3/uL (ref 0.0–0.5)
Eosinophils Relative: 0 %
HCT: 44.2 % (ref 39.0–52.0)
Hemoglobin: 14.6 g/dL (ref 13.0–17.0)
Immature Granulocytes: 6 %
Lymphocytes Relative: 18 %
Lymphs Abs: 1.7 10*3/uL (ref 0.7–4.0)
MCH: 29 pg (ref 26.0–34.0)
MCHC: 33 g/dL (ref 30.0–36.0)
MCV: 87.7 fL (ref 80.0–100.0)
Monocytes Absolute: 0.7 10*3/uL (ref 0.1–1.0)
Monocytes Relative: 7 %
Neutro Abs: 6.5 10*3/uL (ref 1.7–7.7)
Neutrophils Relative %: 68 %
Platelets: 344 10*3/uL (ref 150–400)
RBC: 5.04 MIL/uL (ref 4.22–5.81)
RDW: 13.1 % (ref 11.5–15.5)
WBC: 9.5 10*3/uL (ref 4.0–10.5)
nRBC: 0 % (ref 0.0–0.2)

## 2020-04-12 LAB — D-DIMER, QUANTITATIVE: D-Dimer, Quant: 1.75 ug/mL-FEU — ABNORMAL HIGH (ref 0.00–0.50)

## 2020-04-12 MED ORDER — DEXAMETHASONE 6 MG PO TABS: 6.0000 mg | ORAL_TABLET | Freq: Every day | ORAL | 0 refills | Status: AC

## 2020-04-12 MED ORDER — ENOXAPARIN SODIUM 80 MG/0.8ML ~~LOC~~ SOLN: 70.0000 mg | SUBCUTANEOUS | Status: DC

## 2020-04-12 MED ORDER — BENZONATATE 100 MG PO CAPS: 100.0000 mg | ORAL_CAPSULE | Freq: Three times a day (TID) | ORAL | 0 refills | Status: DC

## 2020-04-12 MED ORDER — DEXAMETHASONE 6 MG PO TABS: 6.0000 mg | ORAL_TABLET | Freq: Every day | ORAL | 0 refills | Status: DC

## 2020-04-12 NOTE — Discharge Instructions (Signed)

## 2020-04-12 NOTE — Progress Notes (Signed)
Pt is scheduled for an outpatient infusion of Remdesivir at 10AM on Monday 7/19 and Tuesday 7/20.  Please have patient report to [place] (Just past parking for [place] when heading towards [place] in the designated spaces for [employer]. For questions call [phone].  Thanks

## 2020-04-12 NOTE — Plan of Care (Signed)

## 2020-04-12 NOTE — Discharge Summary (Signed)
Physician Discharge Summary  Calvin Bailey HKV:[id number] DOB: 02-13-1981 DOA: 04/10/2020  PCP: Maximiano Coss, NP  Admit date: 04/10/2020 Discharge date: 04/12/2020  Time spent: 40 minutes  Recommendations for Outpatient Follow-up:  1. Needs follow-up appointment in about 5 to 7 days once outside of convalescent.  With PCP 2. Steroid burst given on discharge 3. Needs outpatient adjustment of diabetes regimen  Discharge Diagnoses:  Principal Problem:   Acute respiratory failure due to COVID-19 [facility]) Active Problems:   Essential hypertension   GERD (gastroesophageal reflux disease)   Cigarette smoker   Obesity hypoventilation syndrome ([place])   Morbid obesity due to excess calories ([facility])   OSA (obstructive sleep apnea)   ARF (acute renal failure) ([place])   Discharge Condition: Improved  Diet recommendation: Heart healthy diabetic  Filed Weights   04/10/20 1558  Weight: (!) 140.6 kg    History of present illness:  51 AAM known hypertensive, low testosterone on testosterone gel, reflux, OSA on CPAP followed by Dr. Brett Fairy of neurology Prior smoker dyslipidemia Uncontrolled blood pressure followed by cardiologist Dr. Berniece Salines Presented to [facility] long ED after having symptoms of shortness of breath since 03/31/2020 when he was diagnosed with COVID-19 Patient received outpatient IVIG-progressively worsened went to see urgent care 04/06/2020 and ultimately because of worsening shortness of breath came in to the hospital on 7/15 a.m. CXR = bilateral infiltrates O2 sats 84% Dimer 4.6 Lactic acid 1.5 Procalcitonin negative BUN/creatinine 31/1.9  He improved rapidly during hospital stay However was somewhat volume depleted by the end of hospital stay See below  Hospital Course:  1. COVID-19 a. Covid pathway protocol utilized during hospital stay b. Lower extremity duplex was done and was negative and he was transiently on Lovenox treatment doses c. Given Actemra on  admission d. Has stabilized from an oxygenation perspective not using oxygen at all on discharge e. He will follow up with the remdesivir clinic and I will place orders for outpatient infusions to complete in the outpatient setting 2. OSA on CPAP  a. cannot use CPAP (aerosolizing) b. Will continue oxygen at nigh 3. AKI 1. 2/2 losartan, Aldactone, Lasix 2. Have held some of these medication 3. Outpatient follow-up with PCP and labs in 1 week 4. uncontrolled hypertension a. Hyperkalemia likely secondary to Aldactone which has been held--Cozaar 50 mg reordered however b. Would discontinue Cozaar in favor of Coreg 25 twice daily would also hold Lasix at this time c. Continue amlodipine 10 5. Low testosterone a. Outpatient follow-up with either PCP or urologist b. Current evidence does not support testosterone replacement and in the setting of Covid with an elevated D-dimer I would hesitate to resume his meds 6. Reflux  Consultations:  None  Discharge Exam: Vitals:   04/12/20 0435 04/12/20 0900  BP: (!) 148/93 (!) 164/93  Pulse: 62 66  Resp:  18  Temp: 97.8 F (36.6 C) 98.1 F (36.7 C)  SpO2: 94% 95%    General: Awake alert coherent no distress Cardiovascular: S1-S2 no murmur rub or gallop Respiratory: Clear no rales no rhonchi Abdomen soft no rebound no guarding Neurologically intact  Discharge Instructions   Discharge Instructions    Diet - low sodium heart healthy   Complete by: As directed    Discharge instructions   Complete by: As directed    Make sure that you complete your course of steroids The coronavirus infusion center will be calling you with regards to appointments for remdesivir-I would encourage you to follow-up with them and ensure  that you get the infusions as this does seem to help in certain cases Remember that you will need to self isolate from other people for at least 10 days from the time of your infection and if 10 days have elapsed it may be  reasonable to still wear a mask in public I would encourage you to follow-up with your primary care physician for further management of your chronic diseases-you can resume most of your medications but get labs in about a week   Increase activity slowly   Complete by: As directed    MyChart COVID-19 home monitoring program   Complete by: Apr 12, 2020    Is the patient willing to use the [facility] for home monitoring?: Yes   Temperature monitoring   Complete by: Apr 12, 2020    After how many days would you like to receive a notification of this patient's flowsheet entries?: 1     Allergies as of 04/12/2020      Reactions   Chlorthalidone Nausea Only   Lisinopril Cough      Medication List    STOP taking these medications   amoxicillin 875 MG tablet Commonly known as: AMOXIL   azithromycin 250 MG tablet Commonly known as: ZITHROMAX   ciprofloxacin 500 MG tablet Commonly known as: CIPRO   losartan 50 MG tablet Commonly known as: COZAAR   oseltamivir 75 MG capsule Commonly known as: TAMIFLU   spironolactone 25 MG tablet Commonly known as: ALDACTONE     TAKE these medications   amLODipine 10 MG tablet Commonly known as: NORVASC Take 10 mg by mouth daily. What changed: Another medication with the same name was removed. Continue taking this medication, and follow the directions you see here.   benzonatate 100 MG capsule Commonly known as: TESSALON Take 1 capsule (100 mg total) by mouth 3 (three) times daily.   dexamethasone 6 MG tablet Commonly known as: DECADRON Take 1 tablet (6 mg total) by mouth daily for 6 days.   furosemide 40 MG tablet Commonly known as: LASIX TAKE 1 TABLET(40 MG) BY MOUTH DAILY What changed: See the new instructions.   hydrALAZINE 25 MG tablet Commonly known as: APRESOLINE Take 1 tablet (25 mg total) by mouth 2 (two) times daily. PLEASE CALL OFFICE TO SCHEDULE AN APPOINTMENT FOR FURTHER REFILLS   labetalol 200 MG  tablet Commonly known as: NORMODYNE Take 1 tablet (200 mg total) by mouth 2 (two) times daily.      Allergies  Allergen Reactions   Chlorthalidone Nausea Only   Lisinopril Cough      The results of significant diagnostics from this hospitalization (including imaging, microbiology, ancillary and laboratory) are listed below for reference.    Significant Diagnostic Studies: DG Chest Port 1 View  Result Date: 04/10/2020 CLINICAL DATA:  Shortness of breath.  COVID positive 04/06/2020. EXAM: PORTABLE CHEST 1 VIEW COMPARISON:  Radiograph 04/07/2020 FINDINGS: Progressive heterogeneous bilateral lung opacities over the past 3 days, moderate parenchymal involvement. Stable borderline cardiomegaly. Unchanged mediastinal contours. No pneumothorax or large pleural effusion. Stable osseous structures. IMPRESSION: Progressive heterogeneous bilateral lung opacities over the past 3 days. Findings consistent with multifocal pneumonia, pattern typical of COVID-19 with moderate parenchymal involvement. Electronically Signed   By: Keith Rake M.D.   On: 04/10/2020 17:48   DG Chest Portable 1 View  Result Date: 04/07/2020 CLINICAL DATA:  COVID positive, cough EXAM: PORTABLE CHEST 1 VIEW COMPARISON:  May 30, 2019 FINDINGS: The heart size and mediastinal contours are  borderline enlarged. There is hazy airspace opacity seen within the periphery of the left mid lung and retrocardiac region. No pleural effusion. No acute osseous abnormality. IMPRESSION: Hazy airspace opacities within the left lung which could be due to infectious etiology and/or atelectasis. Electronically Signed   By: Prudencio Pair M.D.   On: 04/07/2020 17:50   VAS [place] LOWER EXTREMITY VENOUS (DVT)  Result Date: 04/11/2020  Lower Venous DVTStudy Indications: Edema, and Swelling.  Limitations: Body habitus and poor ultrasound/tissue interface. Comparison Study: no prior Performing Technologist: Abram Sander RVS  Examination Guidelines: A  complete evaluation includes B-mode imaging, spectral Doppler, color Doppler, and power Doppler as needed of all accessible portions of each vessel. Bilateral testing is considered an integral part of a complete examination. Limited examinations for reoccurring indications may be performed as noted. The reflux portion of the exam is performed with the patient in reverse Trendelenburg.  +---------+---------------+---------+-----------+----------+--------------+  RIGHT     Compressibility Phasicity Spontaneity Properties Thrombus Aging  +---------+---------------+---------+-----------+----------+--------------+  CFV       Full            Yes       Yes                                    +---------+---------------+---------+-----------+----------+--------------+  SFJ       Full                                                             +---------+---------------+---------+-----------+----------+--------------+  FV Prox   Full                                                             +---------+---------------+---------+-----------+----------+--------------+  FV Mid    Full                                                             +---------+---------------+---------+-----------+----------+--------------+  FV Distal Full                                                             +---------+---------------+---------+-----------+----------+--------------+  PFV       Full                                                             +---------+---------------+---------+-----------+----------+--------------+  POP       Full            Yes  Yes                                    +---------+---------------+---------+-----------+----------+--------------+  PTV       Full                                                             +---------+---------------+---------+-----------+----------+--------------+  PERO                                                       Not visualized   +---------+---------------+---------+-----------+----------+--------------+   +---------+---------------+---------+-----------+----------+--------------+  LEFT      Compressibility Phasicity Spontaneity Properties Thrombus Aging  +---------+---------------+---------+-----------+----------+--------------+  CFV       Full            Yes       Yes                                    +---------+---------------+---------+-----------+----------+--------------+  SFJ       Full                                                             +---------+---------------+---------+-----------+----------+--------------+  FV Prox   Full                                                             +---------+---------------+---------+-----------+----------+--------------+  FV Mid    Full                                                             +---------+---------------+---------+-----------+----------+--------------+  FV Distal Full                                                             +---------+---------------+---------+-----------+----------+--------------+  PFV       Full                                                             +---------+---------------+---------+-----------+----------+--------------+  POP       Full  Yes       Yes                                    +---------+---------------+---------+-----------+----------+--------------+  PTV       Full                                                             +---------+---------------+---------+-----------+----------+--------------+  PERO                                                       Not visualized  +---------+---------------+---------+-----------+----------+--------------+     Summary: BILATERAL: - No evidence of deep vein thrombosis seen in the lower extremities, bilaterally. - No evidence of superficial venous thrombosis in the lower extremities, bilaterally. -   *See table(s) above for measurements and observations.    Preliminary      Microbiology: Recent Results (from the past 240 hour(s))  Culture, group A strep (throat)     Status: None   Collection Time: 04/06/20  2:41 PM   Specimen: Throat  Result Value Ref Range Status   Specimen Description THROAT  Final   Special Requests NONE  Final   Culture   Final    NO GROUP A STREP (S.PYOGENES) ISOLATED Performed at [facility], [address], [place], [place] [address]    Report Status 04/09/2020 FINAL  Final  SARS CORONAVIRUS 2 (TAT 6-24 HRS) Nasopharyngeal Nasopharyngeal Swab     Status: Abnormal   Collection Time: 04/06/20  6:45 PM   Specimen: Nasopharyngeal Swab  Result Value Ref Range Status   SARS Coronavirus 2 POSITIVE (A) NEGATIVE Final    Comment: EMAILED [place] BY MESS[facility] ON 04/07/2020 (NOTE) SARS-CoV-2 target nucleic acids are DETECTED.  The SARS-CoV-2 RNA is generally detectable in upper and lower respiratory specimens during the acute phase of infection. Positive results are indicative of the presence of SARS-CoV-2 RNA. Clinical correlation with patient history and other diagnostic information is  necessary to determine patient infection status. Positive results do not rule out bacterial infection or co-infection with other viruses.  The expected result is Negative.  Fact Sheet for Patients: SugarRoll.be  Fact Sheet for Healthcare Providers: https://www.woods-mathews.com/  This test is not yet approved or cleared by the [place] FDA and  has been authorized for detection and/or diagnosis of SARS-CoV-2 by FDA under an Emergency Use Authorization (EUA). This EUA will remain  in effect (meaning this test can be used) for the duration of the COVID-19 de claration under Section 564(b)(1) of the Act, 21 U.S.C. section 360bbb-3(b)(1), unless the authorization is terminated or revoked sooner.   Performed at [facility], [place] [address] [place], [place] [address]   Blood  Culture (routine x 2)     Status: None (Preliminary result)   Collection Time: 04/10/20  5:00 PM   Specimen: BLOOD  Result Value Ref Range Status   Specimen Description   Final    BLOOD RIGHT ANTECUBITAL Performed at [facility] [address] [place],  [place] [address]    Special Requests   Final    BOTTLES DRAWN AEROBIC AND ANAEROBIC Blood Culture adequate volume Performed at [facility] [address], [place], [place] [address]    Culture   Final    NO GROWTH < 24 HOURS Performed at [facility] [address] [place], [place] [address]    Report Status PENDING  Incomplete  Blood Culture (routine x 2)     Status: None (Preliminary result)   Collection Time: 04/10/20  5:46 PM   Specimen: BLOOD LEFT HAND  Result Value Ref Range Status   Specimen Description   Final    BLOOD LEFT HAND Performed at [facility] [address] [place], [place] [address]    Special Requests   Final    BOTTLES DRAWN AEROBIC ONLY Blood Culture adequate volume Performed at [facility] [address], [place], [place] [address]    Culture   Final    NO GROWTH < 24 HOURS Performed at [facility] [address] [place], [place] [address]    Report Status PENDING  Incomplete     Labs: Basic Metabolic Panel: Recent Labs  Lab 04/10/20 1700 04/11/20 0357 04/12/20 0428  NA 139 136 137  K 4.3 5.6* 5.1  CL 104 104 103  CO2 23 22 20*  GLUCOSE 106* 145* 135*  BUN 31* 31* 41*  CREATININE 1.99* 1.71* 1.93*  CALCIUM 8.5* 8.8* 9.1  MG  --  2.6*  --   PHOS  --  3.2  --    Liver Function Tests: Recent Labs  Lab 04/10/20 1700 04/11/20 0357 04/12/20 0428  AST 61* 58* 45*  ALT 60* 65* 67*  ALKPHOS 71 79 67  BILITOT 0.7 0.4 0.5  PROT 7.8 8.1 7.8  ALBUMIN 3.6 3.6 3.7   No results for input(s): LIPASE, AMYLASE in the last 168 hours. No results for input(s): AMMONIA in the last 168  hours. CBC: Recent Labs  Lab 04/10/20 1700 04/11/20 0357 04/12/20 0428  WBC 5.7 3.6* 9.5  NEUTROABS 3.6 2.7 6.5  HGB 15.6 15.9 14.6  HCT 46.4 48.0 44.2  MCV 87.1 87.1 87.7  PLT 242 253 344   Cardiac Enzymes: No results for input(s): CKTOTAL, CKMB, CKMBINDEX, TROPONINI in the last 168 hours. BNP: BNP (last 3 results) Recent Labs    05/30/19 1500  BNP 9.8    ProBNP (last 3 results) No results for input(s): PROBNP in the last 8760 hours.  CBG: No results for input(s): GLUCAP in the last 168 hours.     Signed:  Nita Sells MD   [facility] 04/12/2020, 10:38 AM

## 2020-04-12 NOTE — Progress Notes (Signed)
Pt discharged home today per Dr. Verlon Au. Pt's IV site D/C'd and WDL. Pt's VSS. Pt provided with home medication list, discharge instructions and prescriptions. Verbalized understanding. Pt left floor via WC in stable condition accompanied by RN and NT.

## 2020-04-14 ENCOUNTER — Encounter (HOSPITAL_COMMUNITY): Payer: Self-pay

## 2020-04-14 ENCOUNTER — Ambulatory Visit (HOSPITAL_COMMUNITY): Admit: 2020-04-14 | Payer: 59

## 2020-04-15 ENCOUNTER — Ambulatory Visit (HOSPITAL_COMMUNITY): Payer: 59

## 2020-04-15 LAB — CULTURE, BLOOD (ROUTINE X 2)
Culture: NO GROWTH
Culture: NO GROWTH
Special Requests: ADEQUATE
Special Requests: ADEQUATE

## 2020-04-17 ENCOUNTER — Ambulatory Visit (HOSPITAL_COMMUNITY): Payer: 59 | Attending: Pulmonary Disease

## 2020-04-19 ENCOUNTER — Ambulatory Visit (HOSPITAL_COMMUNITY): Payer: 59

## 2020-04-23 ENCOUNTER — Other Ambulatory Visit: Payer: Self-pay

## 2020-04-23 DIAGNOSIS — I10 Essential (primary) hypertension: Secondary | ICD-10-CM

## 2020-04-23 MED ORDER — LOSARTAN POTASSIUM 50 MG PO TABS: 50.0000 mg | ORAL_TABLET | Freq: Every day | ORAL | 0 refills | Status: DC

## 2020-04-23 MED ORDER — AMLODIPINE BESYLATE 10 MG PO TABS: 10.0000 mg | ORAL_TABLET | Freq: Every day | ORAL | 0 refills | Status: DC

## 2020-04-23 MED ORDER — HYDRALAZINE HCL 25 MG PO TABS: 25.0000 mg | ORAL_TABLET | Freq: Two times a day (BID) | ORAL | 0 refills | Status: DC

## 2020-04-23 MED ORDER — SPIRONOLACTONE 25 MG PO TABS: 25.0000 mg | ORAL_TABLET | Freq: Every day | ORAL | 0 refills | Status: DC

## 2020-04-23 NOTE — Telephone Encounter (Signed)
Called patient to confirm he was still taking the Losartan 50 mg daily.  He also informed me he needed refills on Aldactone 25 mg daily, Hydralazine 25 mg BID, and Amlodipine 10 mg daily. Refills sent to [facility] per patient request.

## 2020-04-24 ENCOUNTER — Ambulatory Visit: Payer: 59 | Admitting: Cardiology

## 2020-04-24 NOTE — Progress Notes (Deleted)
Cardiology Office Note:    Date:  04/24/2020   ID:  OMRAN KEELIN, DOB [birth year], MRN [id number]  PCP:  Maximiano Coss, NP  Cardiologist:  Berniece Salines, DO  Electrophysiologist:  None   Referring MD: Maximiano Coss, NP   No chief complaint on file. ***  History of Present Illness:    Fitzhugh Vizcarrondo Claytonis a 39 y.o.malewith a hx of hypertension,tobacco use, CKD, OSA , dyslipidemia presents today for a follow up.  I did see patient on 06/01/2019 in an initial evaluation. At which time he was ontakingcarvedilol 25 mg twice daily, amlodipine 10 mg daily,and Lasix 40mg  daily. During his visit his blood pressure was elevated and he did have bilateral leg edema. He medications were modified: Amlodipine was decreased to 5mg  due to concern for leg edema and Losartan 50mg  was added.  He follow up on 06/08/2019 at which time aldactone 12.5mg  was added to the patient's regimen. He came for a bp check visit on 07/06/2019 given his elevated blood pressure his aldactone was increased to 25mg  daily  On July 12, 2019 during that visit hydralazine 5 mg twice a day was added and he was advised that it was okay to take his Lasix on a as needed basis.   I saw the patient on November 13,2020 at that time he had not been taking his carvedilol as prescribed he was significantly hypertensive therefore I stop the carvedilol and started home on labetalol 200 mg twice a day.  He was continued on his Aldactone 12.5 mg daily, his amlodipine 5 mg daily, losartan 50 mg daily and hydralazine 25 mg twice a day.  In the interim the patient has no showed on couple of his follow-up appointments.  Past Medical History:  Diagnosis Date  . Dizziness 09/25/2019  . GERD (gastroesophageal reflux disease)   . HTN (hypertension)   . Obesity hypoventilation syndrome ([place]) 04/08/2014  . OSA (obstructive sleep apnea) 04/08/2014  . Sleep apnea     Past Surgical History:  Procedure Laterality Date  . two knee  surgeries Left 1995    Current Medications: No outpatient medications have been marked as taking for the 04/24/20 encounter (Appointment) with Berniece Salines, DO.     Allergies:   Chlorthalidone and Lisinopril   Social History   Socioeconomic History  . Marital status: Single    Spouse name: Not on file  . Number of children: 1  . Years of education: college  . Highest education level: Not on file  Occupational History  . Occupation: unemployment    Fish farm manager: UNEMPLOYED    Employer: [employer]  Tobacco Use  . Smoking status: Current Every Day Smoker    Packs/day: 1.00    Years: 16.00    Pack years: 16.00    Types: Cigarettes  . Smokeless tobacco: Never Used  Substance and Sexual Activity  . Alcohol use: Yes    Alcohol/week: 0.0 standard drinks    Comment: sparingly   . Drug use: No  . Sexual activity: Yes    Partners: Female  Other Topics Concern  . 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   Patient has one child.   Patient works at [employer].   Patient has a college education.   Patient is left-handed.   Patient does not drink any caffeine.   Social Determinants of Health   Financial Resource Strain:   . Difficulty of Paying Living Expenses:  Food Insecurity:   . Worried About Charity fundraiser in the Last Year:   . Arboriculturist in the Last Year:   Transportation Needs:   . Film/video editor (Medical):   Marland Kitchen Lack of Transportation (Non-Medical):   Physical Activity:   . Days of Exercise per Week:   . Minutes of Exercise per Session:   Stress:   . Feeling of Stress :   Social Connections:   . Frequency of Communication with Friends and Family:   . Frequency of Social Gatherings with Friends and Family:   . Attends Religious Services:   . Active Member of Clubs or Organizations:   . Attends Archivist Meetings:   Marland Kitchen Marital Status:      Family History: The patient's  family history includes CVA in his maternal uncle; Diabetes in his maternal grandmother; Heart failure in his maternal grandfather and paternal grandfather; Hyperlipidemia in his paternal grandfather; Hypertension in his brother, father, and mother; Multiple sclerosis in his mother.  ROS:   Review of Systems  Constitution: Negative for decreased appetite, fever and weight gain.  HENT: Negative for congestion, ear discharge, hoarse voice and sore throat.   Eyes: Negative for discharge, redness, vision loss in right eye and visual halos.  Cardiovascular: Negative for chest pain, dyspnea on exertion, leg swelling, orthopnea and palpitations.  Respiratory: Negative for cough, hemoptysis, shortness of breath and snoring.   Endocrine: Negative for heat intolerance and polyphagia.  Hematologic/Lymphatic: Negative for bleeding problem. Does not bruise/bleed easily.  Skin: Negative for flushing, nail changes, rash and suspicious lesions.  Musculoskeletal: Negative for arthritis, joint pain, muscle cramps, myalgias, neck pain and stiffness.  Gastrointestinal: Negative for abdominal pain, bowel incontinence, diarrhea and excessive appetite.  Genitourinary: Negative for decreased libido, genital sores and incomplete emptying.  Neurological: Negative for brief paralysis, focal weakness, headaches and loss of balance.  Psychiatric/Behavioral: Negative for altered mental status, depression and suicidal ideas.  Allergic/Immunologic: Negative for HIV exposure and persistent infections.    EKGs/Labs/Other Studies Reviewed:    The following studies were reviewed today:   EKG:  The ekg ordered today demonstrates   Recent Labs: 05/30/2019: BNP 9.8; TSH 1.210 04/11/2020: Magnesium 2.6 04/12/2020: ALT 67; BUN 41; Creatinine, Ser 1.93; Hemoglobin 14.6; Platelets 344; Potassium 5.1; Sodium 137  Recent Lipid Panel    Component Value Date/Time   CHOL 149 05/30/2019 1500   TRIG 394 (H) 04/10/2020 1700   HDL 34  (L) 05/30/2019 1500   CHOLHDL 4.4 05/30/2019 1500   CHOLHDL 5.1 02/23/2011 1223   VLDL 34 02/23/2011 1223   LDLCALC 93 05/30/2019 1500    Physical Exam:    VS:  There were no vitals taken for this visit.    Wt Readings from Last 3 Encounters:  04/10/20 (!) 310 lb (140.6 kg)  08/10/19 (!) 387 lb (175.5 kg)  07/31/19 (!) 380 lb (172.4 kg)     GEN: Well nourished, well developed in no acute distress HEENT: Normal NECK: No JVD; No carotid bruits LYMPHATICS: No lymphadenopathy CARDIAC: S1S2 noted,RRR, no murmurs, rubs, gallops RESPIRATORY:  Clear to auscultation without rales, wheezing or rhonchi  ABDOMEN: Soft, non-tender, non-distended, +bowel sounds, no guarding. EXTREMITIES: No edema, No cyanosis, no clubbing MUSCULOSKELETAL:  No deformity  SKIN: Warm and dry NEUROLOGIC:  Alert and oriented x 3, non-focal PSYCHIATRIC:  Normal affect, good insight  ASSESSMENT:    No diagnosis found. PLAN:     1.  The patient is in agreement  with the above plan. The patient left the office in stable condition.  The patient will follow up in   Medication Adjustments/Labs and Tests Ordered: Current medicines are reviewed at length with the patient today.  Concerns regarding medicines are outlined above.  No orders of the defined types were placed in this encounter.  No orders of the defined types were placed in this encounter.   There are no Patient Instructions on file for this visit.   Adopting a Healthy Lifestyle.  Know what a healthy weight is for you (roughly BMI <25) and aim to maintain this   Aim for 7+ servings of fruits and vegetables daily   65-80+ fluid ounces of water or unsweet tea for healthy kidneys   Limit to max 1 drink of alcohol per day; avoid smoking/tobacco   Limit animal fats in diet for cholesterol and heart health - choose grass fed whenever available   Avoid highly processed foods, and foods high in saturated/trans fats   Aim for low stress - take  time to unwind and care for your mental health   Aim for 150 min of moderate intensity exercise weekly for heart health, and weights twice weekly for bone health   Aim for 7-9 hours of sleep daily   When it comes to diets, agreement about the perfect plan isnt easy to find, even among the experts. Experts at the [employer] developed an idea known as the Healthy Eating Plate. Just imagine a plate divided into logical, healthy portions.   The emphasis is on diet quality:   Load up on vegetables and fruits - one-half of your plate: Aim for color and variety, and remember that potatoes dont count.   Go for whole grains - one-quarter of your plate: Whole wheat, barley, wheat berries, quinoa, oats, brown rice, and foods made with them. If you want pasta, go with whole wheat pasta.   Protein power - one-quarter of your plate: Fish, chicken, beans, and nuts are all healthy, versatile protein sources. Limit red meat.   The diet, however, does go beyond the plate, offering a few other suggestions.   Use healthy plant oils, such as olive, canola, soy, corn, sunflower and peanut. Check the labels, and avoid partially hydrogenated oil, which have unhealthy trans fats.   If youre thirsty, drink water. Coffee and tea are good in moderation, but skip sugary drinks and limit milk and dairy products to one or two daily servings.   The type of carbohydrate in the diet is more important than the amount. Some sources of carbohydrates, such as vegetables, fruits, whole grains, and beans-are healthier than others.   Finally, stay active  Signed, Berniece Salines, DO  04/24/2020 10:04 AM    [place]

## 2020-05-19 ENCOUNTER — Other Ambulatory Visit: Payer: Self-pay

## 2020-05-19 MED ORDER — LABETALOL HCL 200 MG PO TABS
200.0000 mg | ORAL_TABLET | Freq: Two times a day (BID) | ORAL | 0 refills | Status: DC
Start: 2020-05-19 — End: 2020-09-15

## 2020-05-19 NOTE — Telephone Encounter (Signed)
Refill sent in for Labetalol 200 mg to [facility].

## 2020-07-07 ENCOUNTER — Other Ambulatory Visit: Payer: Self-pay | Admitting: Registered Nurse

## 2020-08-17 ENCOUNTER — Other Ambulatory Visit: Payer: Self-pay | Admitting: Cardiology

## 2020-08-18 ENCOUNTER — Telehealth: Payer: Self-pay | Admitting: Cardiology

## 2020-08-18 MED ORDER — AMLODIPINE BESYLATE 10 MG PO TABS
10.0000 mg | ORAL_TABLET | Freq: Every day | ORAL | 0 refills | Status: DC
Start: 2020-08-18 — End: 2020-09-15

## 2020-08-18 MED ORDER — LOSARTAN POTASSIUM 50 MG PO TABS
50.0000 mg | ORAL_TABLET | Freq: Every day | ORAL | 0 refills | Status: DC
Start: 2020-08-18 — End: 2020-09-15

## 2020-08-18 MED ORDER — SPIRONOLACTONE 25 MG PO TABS
25.0000 mg | ORAL_TABLET | Freq: Every day | ORAL | 0 refills | Status: DC
Start: 2020-08-18 — End: 2020-09-15

## 2020-08-18 NOTE — Telephone Encounter (Signed)
*  STAT* If patient is at the pharmacy, call can be transferred to refill team.   1. Which medications need to be refilled? (please list name of each medication and dose if known)  spironolactone (ALDACTONE) 25 MG tablet losartan (COZAAR) 50 MG tablet amLODipine (NORVASC) 10 MG tablet  2. Which pharmacy/location (including street and city if local pharmacy) is medication to be sent to?  [facility], [place] - [address] AT [place]  3. Do they need a 30 day or 90 day supply? 30  Pt is scheduled to see Dr. Harriet Masson 09/15/20 in [facility]

## 2020-08-18 NOTE — Telephone Encounter (Signed)
Refill for Losartan sent, patient needs appointment for future refill

## 2020-08-18 NOTE — Telephone Encounter (Signed)
Refill sent in per request.  

## 2020-09-11 ENCOUNTER — Other Ambulatory Visit: Payer: Self-pay

## 2020-09-11 DIAGNOSIS — I10 Essential (primary) hypertension: Secondary | ICD-10-CM | POA: Insufficient documentation

## 2020-09-15 ENCOUNTER — Encounter: Payer: Self-pay | Admitting: Cardiology

## 2020-09-15 ENCOUNTER — Ambulatory Visit (INDEPENDENT_AMBULATORY_CARE_PROVIDER_SITE_OTHER): Payer: 59 | Admitting: Cardiology

## 2020-09-15 ENCOUNTER — Other Ambulatory Visit: Payer: Self-pay

## 2020-09-15 VITALS — BP 150/88 | HR 70 | Ht 73.0 in | Wt 399.0 lb

## 2020-09-15 DIAGNOSIS — F17200 Nicotine dependence, unspecified, uncomplicated: Secondary | ICD-10-CM

## 2020-09-15 DIAGNOSIS — R600 Localized edema: Secondary | ICD-10-CM

## 2020-09-15 DIAGNOSIS — R0609 Other forms of dyspnea: Secondary | ICD-10-CM

## 2020-09-15 DIAGNOSIS — I10 Essential (primary) hypertension: Secondary | ICD-10-CM

## 2020-09-15 DIAGNOSIS — R0600 Dyspnea, unspecified: Secondary | ICD-10-CM

## 2020-09-15 DIAGNOSIS — E662 Morbid (severe) obesity with alveolar hypoventilation: Secondary | ICD-10-CM | POA: Diagnosis not present

## 2020-09-15 DIAGNOSIS — G4733 Obstructive sleep apnea (adult) (pediatric): Secondary | ICD-10-CM | POA: Diagnosis not present

## 2020-09-15 MED ORDER — SPIRONOLACTONE 25 MG PO TABS
25.0000 mg | ORAL_TABLET | Freq: Every day | ORAL | 3 refills | Status: DC
Start: 2020-09-15 — End: 2020-10-31

## 2020-09-15 MED ORDER — LOSARTAN POTASSIUM 50 MG PO TABS
50.0000 mg | ORAL_TABLET | Freq: Every day | ORAL | 3 refills | Status: DC
Start: 2020-09-15 — End: 2021-03-17

## 2020-09-15 MED ORDER — LABETALOL HCL 200 MG PO TABS
200.0000 mg | ORAL_TABLET | Freq: Two times a day (BID) | ORAL | 3 refills | Status: DC
Start: 2020-09-15 — End: 2021-10-19

## 2020-09-15 MED ORDER — HYDRALAZINE HCL 25 MG PO TABS: 25.0000 mg | ORAL_TABLET | Freq: Two times a day (BID) | ORAL | 3 refills | Status: DC

## 2020-09-15 MED ORDER — AMLODIPINE BESYLATE 10 MG PO TABS
10.0000 mg | ORAL_TABLET | Freq: Every day | ORAL | 3 refills | Status: DC
Start: 2020-09-15 — End: 2021-03-17

## 2020-09-15 NOTE — Patient Instructions (Signed)
Medication Instructions:  No medication changes. *If you need a refill on your cardiac medications before your next appointment, please call your pharmacy*   Lab Work: Your physician recommends that you have labs done in the office today. Your test included  basic metabolic panel, magnesium and BNP.  If you have labs (blood work) drawn today and your tests are completely normal, you will receive your results only by: Marland Kitchen MyChart Message (if you have MyChart) OR . A paper copy in the mail If you have any lab test that is abnormal or we need to change your treatment, we will call you to review the results.   Testing/Procedures: Your physician has requested that you have an echocardiogram. Echocardiography is a painless test that uses sound waves to create images of your heart. It provides your doctor with information about the size and shape of your heart and how well your heart's chambers and valves are working. This procedure takes approximately one hour. There are no restrictions for this procedure.     Follow-Up: At [facility], you and your health needs are our priority.  As part of our continuing mission to provide you with exceptional heart care, we have created designated Provider Care Teams.  These Care Teams include your primary Cardiologist (physician) and Advanced Practice Providers (APPs -  Physician Assistants and Nurse Practitioners) who all work together to provide you with the care you need, when you need it.  We recommend signing up for the patient portal called "MyChart".  Sign up information is provided on this After Visit Summary.  MyChart is used to connect with patients for Virtual Visits (Telemedicine).  Patients are able to view lab/test results, encounter notes, upcoming appointments, etc.  Non-urgent messages can be sent to your provider as well.   To learn more about what you can do with MyChart, go to [email or web address].    Your next appointment:   8  week(s)  The format for your next appointment:   In Person  Provider:   Berniece Salines, DO   Other Instructions  Echocardiogram An echocardiogram is a procedure that uses painless sound waves (ultrasound) to produce an image of the heart. Images from an echocardiogram can provide important information about:  Signs of coronary artery disease (CAD).  Aneurysm detection. An aneurysm is a weak or damaged part of an artery wall that bulges out from the normal force of blood pumping through the body.  Heart size and shape. Changes in the size or shape of the heart can be associated with certain conditions, including heart failure, aneurysm, and CAD.  Heart muscle function.  Heart valve function.  Signs of a past heart attack.  Fluid buildup around the heart.  Thickening of the heart muscle.  A tumor or infectious growth around the heart valves. Tell a health care provider about:  Any allergies you have.  All medicines you are taking, including vitamins, herbs, eye drops, creams, and over-the-counter medicines.  Any blood disorders you have.  Any surgeries you have had.  Any medical conditions you have.  Whether you are pregnant or may be pregnant. What are the risks? Generally, this is a safe procedure. However, problems may occur, including:  Allergic reaction to dye (contrast) that may be used during the procedure. What happens before the procedure? No specific preparation is needed. You may eat and drink normally. What happens during the procedure?   An IV tube may be inserted into one of your veins.  You may receive  contrast through this tube. A contrast is an injection that improves the quality of the pictures from your heart.  A gel will be applied to your chest.  A wand-like tool (transducer) will be moved over your chest. The gel will help to transmit the sound waves from the transducer.  The sound waves will harmlessly bounce off of your heart to allow  the heart images to be captured in real-time motion. The images will be recorded on a computer. The procedure may vary among health care providers and hospitals. What happens after the procedure?  You may return to your normal, everyday life, including diet, activities, and medicines, unless your health care provider tells you not to do that. Summary  An echocardiogram is a procedure that uses painless sound waves (ultrasound) to produce an image of the heart.  Images from an echocardiogram can provide important information about the size and shape of your heart, heart muscle function, heart valve function, and fluid buildup around your heart.  You do not need to do anything to prepare before this procedure. You may eat and drink normally.  After the echocardiogram is completed, you may return to your normal, everyday life, unless your health care provider tells you not to do that. This information is not intended to replace advice given to you by your health care provider. Make sure you discuss any questions you have with your health care provider. Document Revised: 01/04/2019 Document Reviewed: 10/16/2016 Elsevier Patient Education  [place].

## 2020-09-15 NOTE — Progress Notes (Signed)
Cardiology Office Note:    Date:  09/15/2020   ID:  Calvin ARENSON, DOB Feb 13, 1981, MRN [id number]  PCP:  Maximiano Coss, NP  Cardiologist:  Berniece Salines, DO  Electrophysiologist:  None   Referring MD: Maximiano Coss, NP   " I am   History of Present Illness:    Calvin Bailey is a 39 y.o. male with a hx of hypertension,tobacco use, CKD, OSA , dyslipidemia presents today for a follow up.  I did see patient on 06/01/2019 in an initial evaluation. At which time he was ontakingcarvedilol 25 mg twice daily, amlodipine 10 mg daily,and Lasix 40mg  daily. During his visit his blood pressure was elevated and he did have bilateral leg edema. He medications were modified: Amlodipine was decreased to 5mg  due to concern for leg edema and Losartan 50mg  was added.  He follow up on 06/08/2019 at which time aldactone 12.5mg  was added to the patient's regimen. He came for a bp check visit on 07/06/2019 given his elevated blood pressure his aldactone was increased to 25mg  daily  On July 12, 2019 during that visit hydralazine 5 mg twice a day was added and he was advised that it was okay to take his Lasix on a as needed basis.  I saw the patient back in November 2020 at that time his blood pressures were still not under 130/80 I started patient on labetalol twice a day.  He remain on Aldactone, amlodipine, losartan and hydralazine twice daily.  We asked for 2 months follow-up unfortunately the patient had not follow-up.  His follow-up today has been prompted due to refill request and the fact that the patient had not been seen over 1 year.  He has had some intermittent shortness of breath but reports significant leg swelling.\  Since I last saw the patient he has had COVID-19 infection.   Past Medical History:  Diagnosis Date  . Dizziness 09/25/2019  . GERD (gastroesophageal reflux disease)   . HTN (hypertension)   . Obesity hypoventilation syndrome ([place]) 04/08/2014  . OSA (obstructive  sleep apnea) 04/08/2014  . Sleep apnea     Past Surgical History:  Procedure Laterality Date  . two knee surgeries Left 1995    Current Medications: Current Meds  Medication Sig  . amLODipine (NORVASC) 10 MG tablet Take 1 tablet (10 mg total) by mouth daily. NEEDS APPOINTMENT FOR FUTURE REFILL  . benzonatate (TESSALON) 100 MG capsule Take 1 capsule (100 mg total) by mouth 3 (three) times daily.  . furosemide (LASIX) 40 MG tablet TAKE 1 TABLET(40 MG) BY MOUTH DAILY (Patient taking differently: Take 40 mg by mouth daily.)  . labetalol (NORMODYNE) 200 MG tablet Take 1 tablet (200 mg total) by mouth 2 (two) times daily.  Marland Kitchen losartan (COZAAR) 50 MG tablet Take 1 tablet (50 mg total) by mouth daily. NEEDS APPOINTMENT FOR FUTURE REFILL  . spironolactone (ALDACTONE) 25 MG tablet Take 1 tablet (25 mg total) by mouth daily. NEEDS APPOINTMENT FOR FUTURE REFILL  . [DISCONTINUED] carvedilol (COREG) 25 MG tablet Take 25 mg by mouth 2 (two) times daily.     Allergies:   Chlorthalidone and Lisinopril   Social History   Socioeconomic History  . Marital status: Single    Spouse name: Not on file  . Number of children: 1  . Years of education: college  . Highest education level: Not on file  Occupational History  . Occupation: unemployment    Fish farm manager: UNEMPLOYED    Employer: [employer]  Tobacco  Use  . Smoking status: Current Every Day Smoker    Packs/day: 1.00    Years: 16.00    Pack years: 16.00    Types: Cigarettes  . Smokeless tobacco: Never Used  Substance and Sexual Activity  . Alcohol use: Yes    Alcohol/week: 0.0 standard drinks    Comment: sparingly   . Drug use: No  . Sexual activity: Yes    Partners: Female  Other Topics Concern  . 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   Patient has one child.   Patient works at [employer].   Patient has a college education.   Patient is left-handed.   Patient does  not drink any caffeine.   Social Determinants of Health   Financial Resource Strain: Not on file  Food Insecurity: Not on file  Transportation Needs: Not on file  Physical Activity: Not on file  Stress: Not on file  Social Connections: Not on file     Family History: The patient's family history includes CVA in his maternal uncle; Diabetes in his maternal grandmother; Heart failure in his maternal grandfather and paternal grandfather; Hyperlipidemia in his paternal grandfather; Hypertension in his brother, father, and mother; Multiple sclerosis in his mother.  ROS:   Review of Systems  Constitution: Negative for decreased appetite, fever and weight gain.  HENT: Negative for congestion, ear discharge, hoarse voice and sore throat.   Eyes: Negative for discharge, redness, vision loss in right eye and visual halos.  Cardiovascular: Negative for chest pain, dyspnea on exertion, leg swelling, orthopnea and palpitations.  Respiratory: Negative for cough, hemoptysis, shortness of breath and snoring.   Endocrine: Negative for heat intolerance and polyphagia.  Hematologic/Lymphatic: Negative for bleeding problem. Does not bruise/bleed easily.  Skin: Negative for flushing, nail changes, rash and suspicious lesions.  Musculoskeletal: Negative for arthritis, joint pain, muscle cramps, myalgias, neck pain and stiffness.  Gastrointestinal: Negative for abdominal pain, bowel incontinence, diarrhea and excessive appetite.  Genitourinary: Negative for decreased libido, genital sores and incomplete emptying.  Neurological: Negative for brief paralysis, focal weakness, headaches and loss of balance.  Psychiatric/Behavioral: Negative for altered mental status, depression and suicidal ideas.  Allergic/Immunologic: Negative for HIV exposure and persistent infections.    EKGs/Labs/Other Studies Reviewed:    The following studies were reviewed today:   EKG: None today  Study Conclusions   -  Procedure narrative: Transthoracic echocardiography. Image  quality was suboptimal. The study was technically difficult.  Intravenous contrast (Definity) was administered.  - Left ventricle: The cavity size was normal. There was severe  concentric hypertrophy. Systolic function was normal. The  estimated ejection fraction was in the range of 55% to 60%. Wall  motion was normal; there were no regional wall motion  abnormalities. The study is not technically sufficient to allow  evaluation of LV diastolic function.  - Aortic valve: Transvalvular velocity was within the normal range.  There was no stenosis. There was no regurgitation.  - Mitral valve: Transvalvular velocity was within the normal range.  There was no evidence for stenosis. There was no regurgitation.  - Left atrium: The atrium was moderately to severely dilated.  - Right ventricle: The cavity size was normal. Wall thickness was  normal. Systolic function was normal.  - Atrial septum: No defect or patent foramen ovale was identified.  - Tricuspid valve: There was no regurgitation.   Recent Labs: 04/11/2020: Magnesium 2.6 04/12/2020: ALT 67; BUN  41; Creatinine, Ser 1.93; Hemoglobin 14.6; Platelets 344; Potassium 5.1; Sodium 137  Recent Lipid Panel    Component Value Date/Time   CHOL 149 05/30/2019 1500   TRIG 394 (H) 04/10/2020 1700   HDL 34 (L) 05/30/2019 1500   CHOLHDL 4.4 05/30/2019 1500   CHOLHDL 5.1 02/23/2011 1223   VLDL 34 02/23/2011 1223   LDLCALC 93 05/30/2019 1500    Physical Exam:    VS:  BP (!) 150/88   Pulse 70   Ht 6\' 1"  (1.854 m)   Wt (!) 399 lb (181 kg)   SpO2 98%   BMI 52.64 kg/m     Wt Readings from Last 3 Encounters:  09/15/20 (!) 399 lb (181 kg)  04/10/20 (!) 310 lb (140.6 kg)  08/10/19 (!) 387 lb (175.5 kg)     GEN: Obese male, appears older than stated age.  Well nourished, well developed in no acute distress HEENT: Normal NECK: No JVD; No carotid  bruits LYMPHATICS: No lymphadenopathy CARDIAC: S1S2 noted,RRR, no murmurs, rubs, gallops RESPIRATORY:  Clear to auscultation without rales, wheezing or rhonchi  ABDOMEN: Soft, non-tender, non-distended, +bowel sounds, no guarding. EXTREMITIES: +2 bilateral leg edema, No cyanosis, no clubbing MUSCULOSKELETAL:  No deformity  SKIN: Warm and dry NEUROLOGIC:  Alert and oriented x 3, non-focal PSYCHIATRIC:  Normal affect, good insight  ASSESSMENT:    1. Essential hypertension   2. Obstructive sleep apnea syndrome   3. Obesity hypoventilation syndrome ([place])   4. Morbid obesity due to excess calories ([place])   5. Dyspnea on exertion   6. Tobacco use disorder   7. Bilateral leg edema    PLAN:     Physical exam does show evidence of bilateral leg edema, and he has gained 12 pounds since the last time he was in the office.  He does also have some shortness of breath.  He had not been taking his Lasix he tells me he would need to restart his Lasix get some blood work which would include BMP, mag as well as BNP.  Repeated echocardiogram will be done to reassess his LV function.   Chronic kidney disease plan to get a BMP to reassess his kidney function as well and electrolytes.  The patient understands the need to lose weight with diet and exercise. We have discussed specific strategies for this.  Smoking cessation advised.  The patient is in agreement with the above plan. The patient left the office in stable condition.  The patient will follow up in 2 months or sooner if needed.   Medication Adjustments/Labs and Tests Ordered: Current medicines are reviewed at length with the patient today.  Concerns regarding medicines are outlined above.  No orders of the defined types were placed in this encounter.  No orders of the defined types were placed in this encounter.   There are no Patient Instructions on file for this visit.   Adopting a Healthy Lifestyle.  Know what a healthy weight is  for you (roughly BMI <25) and aim to maintain this   Aim for 7+ servings of fruits and vegetables daily   65-80+ fluid ounces of water or unsweet tea for healthy kidneys   Limit to max 1 drink of alcohol per day; avoid smoking/tobacco   Limit animal fats in diet for cholesterol and heart health - choose grass fed whenever available   Avoid highly processed foods, and foods high in saturated/trans fats   Aim for low stress - take time to unwind and care  for your mental health   Aim for 150 min of moderate intensity exercise weekly for heart health, and weights twice weekly for bone health   Aim for 7-9 hours of sleep daily   When it comes to diets, agreement about the perfect plan isnt easy to find, even among the experts. Experts at the [employer] developed an idea known as the Healthy Eating Plate. Just imagine a plate divided into logical, healthy portions.   The emphasis is on diet quality:   Load up on vegetables and fruits - one-half of your plate: Aim for color and variety, and remember that potatoes dont count.   Go for whole grains - one-quarter of your plate: Whole wheat, barley, wheat berries, quinoa, oats, brown rice, and foods made with them. If you want pasta, go with whole wheat pasta.   Protein power - one-quarter of your plate: Fish, chicken, beans, and nuts are all healthy, versatile protein sources. Limit red meat.   The diet, however, does go beyond the plate, offering a few other suggestions.   Use healthy plant oils, such as olive, canola, soy, corn, sunflower and peanut. Check the labels, and avoid partially hydrogenated oil, which have unhealthy trans fats.   If youre thirsty, drink water. Coffee and tea are good in moderation, but skip sugary drinks and limit milk and dairy products to one or two daily servings.   The type of carbohydrate in the diet is more important than the amount. Some sources of carbohydrates, such as vegetables,  fruits, whole grains, and beans-are healthier than others.   Finally, stay active  Signed, Berniece Salines, DO  09/15/2020 1:45 PM    [facility]

## 2020-09-16 LAB — BASIC METABOLIC PANEL
BUN/Creatinine Ratio: 10 (ref 9–20)
BUN: 14 mg/dL (ref 6–24)
CO2: 22 mmol/L (ref 20–29)
Calcium: 8.9 mg/dL (ref 8.7–10.2)
Chloride: 104 mmol/L (ref 96–106)
Creatinine, Ser: 1.37 mg/dL — ABNORMAL HIGH (ref 0.76–1.27)
GFR calc Af Amer: 74 mL/min/{1.73_m2} (ref >59)
GFR calc non Af Amer: 64 mL/min/{1.73_m2} (ref >59)
Glucose: 89 mg/dL (ref 65–99)
Potassium: 4.6 mmol/L (ref 3.5–5.2)
Sodium: 140 mmol/L (ref 134–144)

## 2020-09-16 LAB — BRAIN NATRIURETIC PEPTIDE: BNP: 12.8 pg/mL (ref 0.0–100.0)

## 2020-09-16 LAB — MAGNESIUM: Magnesium: 2.2 mg/dL (ref 1.6–2.3)

## 2020-09-22 ENCOUNTER — Telehealth: Payer: Self-pay

## 2020-09-22 NOTE — Telephone Encounter (Signed)
-----   Message from Berniece Salines, DO sent at 09/17/2020 12:01 PM EST ----- Creatinine is at baseline 1.37, electrolytes normal.

## 2020-09-22 NOTE — Telephone Encounter (Signed)
Spoke with patient regarding results and recommendation.  Patient verbalizes understanding and is agreeable to plan of care. Advised patient to call back with any issues or concerns.  

## 2020-10-13 ENCOUNTER — Ambulatory Visit (HOSPITAL_BASED_OUTPATIENT_CLINIC_OR_DEPARTMENT_OTHER): Payer: 59

## 2020-10-23 ENCOUNTER — Other Ambulatory Visit: Payer: Self-pay

## 2020-10-23 ENCOUNTER — Ambulatory Visit (HOSPITAL_BASED_OUTPATIENT_CLINIC_OR_DEPARTMENT_OTHER)
Admission: RE | Admit: 2020-10-23 | Discharge: 2020-10-23 | Disposition: A | Discharge status: Home / Self Care | Payer: 59 | Source: Ambulatory Visit | Attending: Cardiology | Admitting: Cardiology

## 2020-10-23 DIAGNOSIS — R0609 Other forms of dyspnea: Secondary | ICD-10-CM

## 2020-10-23 DIAGNOSIS — R0600 Dyspnea, unspecified: Secondary | ICD-10-CM | POA: Diagnosis not present

## 2020-10-23 LAB — ECHOCARDIOGRAM COMPLETE
AR max vel: 2.69 cm2
AV Area VTI: 2.6 cm2
AV Area mean vel: 2.48 cm2
AV Mean grad: 9 mmHg
AV Peak grad: 16.3 mmHg
Ao pk vel: 2.02 m/s
Area-P 1/2: 3.02 cm2
S' Lateral: 3.05 cm

## 2020-10-23 MED ORDER — PERFLUTREN LIPID MICROSPHERE
1.0000 mL | INTRAVENOUS | Status: AC | PRN
  Administered 2020-10-23: 3 mL via INTRAVENOUS

## 2020-10-27 NOTE — Progress Notes (Signed)
Left message for patient to return our call.

## 2020-10-30 ENCOUNTER — Other Ambulatory Visit: Payer: Self-pay | Admitting: Registered Nurse

## 2020-10-30 DIAGNOSIS — I10 Essential (primary) hypertension: Secondary | ICD-10-CM

## 2020-10-31 ENCOUNTER — Other Ambulatory Visit: Payer: Self-pay

## 2020-10-31 MED ORDER — SPIRONOLACTONE 25 MG PO TABS: 25.0000 mg | ORAL_TABLET | Freq: Every day | ORAL | 3 refills | Status: DC

## 2020-10-31 NOTE — Telephone Encounter (Signed)
Refill for Spironolactone mg # 90 with 3 additional refills sent to [facility]

## 2020-11-14 ENCOUNTER — Other Ambulatory Visit: Payer: Self-pay

## 2020-11-14 ENCOUNTER — Encounter: Payer: Self-pay | Admitting: Cardiology

## 2020-11-14 ENCOUNTER — Ambulatory Visit: Payer: 59 | Admitting: Cardiology

## 2020-11-14 VITALS — BP 142/90 | HR 64 | Ht 73.0 in | Wt 382.0 lb

## 2020-11-14 DIAGNOSIS — G4733 Obstructive sleep apnea (adult) (pediatric): Secondary | ICD-10-CM | POA: Diagnosis not present

## 2020-11-14 DIAGNOSIS — I10 Essential (primary) hypertension: Secondary | ICD-10-CM | POA: Diagnosis not present

## 2020-11-14 DIAGNOSIS — F17210 Nicotine dependence, cigarettes, uncomplicated: Secondary | ICD-10-CM

## 2020-11-14 MED ORDER — HYDRALAZINE HCL 50 MG PO TABS: 50.0000 mg | ORAL_TABLET | Freq: Two times a day (BID) | ORAL | 3 refills | Status: DC

## 2020-11-14 MED ORDER — FUROSEMIDE 40 MG PO TABS: 40.0000 mg | ORAL_TABLET | Freq: Every day | ORAL | 3 refills | Status: DC

## 2020-11-14 NOTE — Progress Notes (Signed)
Cardiology Office Note:    Date:  11/14/2020   ID:  Calvin Bailey, DOB April 02, 1981, MRN [id number]  PCP:  Patient, No Pcp Per  Cardiologist:  Berniece Salines, DO  Electrophysiologist:  None   Referring MD: Maximiano Coss, NP   Chief Complaint  Patient presents with  . Follow-up   History of Present Illness:    Calvin Bailey is a 40 y.o. male with a hx of hypertension,tobacco use, CKD, OSA , dyslipidemia presents today for a follow up.  I did see patient on 06/01/2019 in an initial evaluation. At which time he was ontakingcarvedilol 25 mg twice daily, amlodipine 10 mg daily,and Lasix '40mg'$  daily. During his visit his blood pressure was elevated and he did have bilateral leg edema. He medications were modified: Amlodipine was decreased to '5mg'$  due to concern for leg edema and Losartan '50mg'$  was added.  He follow up on 06/08/2019 at which time aldactone 12.'5mg'$  was added to the patient's regimen. He came for a bp check visit on 07/06/2019 given his elevated blood pressure his aldactone was increased to '25mg'$  daily  On July 12, 2019 during that visit hydralazine 5 mg twice a day was added and he was advised that it was okay to take his Lasix on a as needed basis.  I saw the patient back in November 2020 at that time his blood pressures were still not under 130/80 I started patient on labetalol twice a day.  He remain on Aldactone, amlodipine, losartan and hydralazine twice daily.  We asked for 2 months follow-up unfortunately the patient had not follow-up.  His follow-up today has been prompted due to refill request and the fact that the patient had not been seen over 1 year.  At his last visit he had not been taking his Lasix his blood pressure slightly elevated we refill his Lasix and due to bilateral leg edema had a repeat echocardiogram. Interim he was able to get his echocardiogram. He is here today for follow-up visit he is doing well from a cardiovascular standpoint.   Past  Medical History:  Diagnosis Date  . Dizziness 09/25/2019  . GERD (gastroesophageal reflux disease)   . HTN (hypertension)   . Obesity hypoventilation syndrome ([place]) 04/08/2014  . OSA (obstructive sleep apnea) 04/08/2014  . Sleep apnea     Past Surgical History:  Procedure Laterality Date  . two knee surgeries Left 1995    Current Medications: Current Meds  Medication Sig  . amLODipine (NORVASC) 10 MG tablet Take 1 tablet (10 mg total) by mouth daily.  . furosemide (LASIX) 40 MG tablet Take 40 mg by mouth daily.  . hydrALAZINE (APRESOLINE) 25 MG tablet Take 1 tablet (25 mg total) by mouth 2 (two) times daily.  Marland Kitchen labetalol (NORMODYNE) 200 MG tablet Take 1 tablet (200 mg total) by mouth 2 (two) times daily.  Marland Kitchen losartan (COZAAR) 50 MG tablet Take 1 tablet (50 mg total) by mouth daily.  Marland Kitchen spironolactone (ALDACTONE) 25 MG tablet Take 1 tablet (25 mg total) by mouth daily.     Allergies:   Chlorthalidone and Lisinopril   Social History   Socioeconomic History  . Marital status: Single    Spouse name: Not on file  . Number of children: 1  . Years of education: college  . Highest education level: Not on file  Occupational History  . Occupation: unemployment    Fish farm manager: UNEMPLOYED    Employer: [employer]  Tobacco Use  . Smoking status: Current Every  Day Smoker    Packs/day: 1.00    Years: 16.00    Pack years: 16.00    Types: Cigarettes  . Smokeless tobacco: Never Used  Substance and Sexual Activity  . Alcohol use: Yes    Alcohol/week: 0.0 standard drinks    Comment: sparingly   . Drug use: No  . Sexual activity: Yes    Partners: Female  Other Topics Concern  . 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   Patient has one child.   Patient works at [employer].   Patient has a college education.   Patient is left-handed.   Patient does not drink any caffeine.   Social Determinants of Health   Financial  Resource Strain: Not on file  Food Insecurity: Not on file  Transportation Needs: Not on file  Physical Activity: Not on file  Stress: Not on file  Social Connections: Not on file     Family History: The patient's family history includes CVA in his maternal uncle; Diabetes in his maternal grandmother; Heart failure in his maternal grandfather and paternal grandfather; Hyperlipidemia in his paternal grandfather; Hypertension in his brother, father, and mother; Multiple sclerosis in his mother.  ROS:   Review of Systems  Constitution: Negative for decreased appetite, fever and weight gain.  HENT: Negative for congestion, ear discharge, hoarse voice and sore throat.   Eyes: Negative for discharge, redness, vision loss in right eye and visual halos.  Cardiovascular: Negative for chest pain, dyspnea on exertion, leg swelling, orthopnea and palpitations.  Respiratory: Negative for cough, hemoptysis, shortness of breath and snoring.   Endocrine: Negative for heat intolerance and polyphagia.  Hematologic/Lymphatic: Negative for bleeding problem. Does not bruise/bleed easily.  Skin: Negative for flushing, nail changes, rash and suspicious lesions.  Musculoskeletal: Negative for arthritis, joint pain, muscle cramps, myalgias, neck pain and stiffness.  Gastrointestinal: Negative for abdominal pain, bowel incontinence, diarrhea and excessive appetite.  Genitourinary: Negative for decreased libido, genital sores and incomplete emptying.  Neurological: Negative for brief paralysis, focal weakness, headaches and loss of balance.  Psychiatric/Behavioral: Negative for altered mental status, depression and suicidal ideas.  Allergic/Immunologic: Negative for HIV exposure and persistent infections.    EKGs/Labs/Other Studies Reviewed:    The following studies were reviewed today:   EKG:  The ekg ordered today demonstrates   Recent Labs: 04/12/2020: ALT 67; Hemoglobin 14.6; Platelets 344 09/15/2020:  BNP 12.8; BUN 14; Creatinine, Ser 1.37; Magnesium 2.2; Potassium 4.6; Sodium 140  Recent Lipid Panel    Component Value Date/Time   CHOL 149 05/30/2019 1500   TRIG 394 (H) 04/10/2020 1700   HDL 34 (L) 05/30/2019 1500   CHOLHDL 4.4 05/30/2019 1500   CHOLHDL 5.1 02/23/2011 1223   VLDL 34 02/23/2011 1223   LDLCALC 93 05/30/2019 1500    Physical Exam:    VS:  BP (!) 142/90 (BP Location: Right Arm, Patient Position: Sitting, Cuff Size: Large)   Pulse 64   Ht '6\' 1"'$  (1.854 m)   Wt (!) 382 lb (173.3 kg)   SpO2 97%   BMI 50.40 kg/m     Wt Readings from Last 3 Encounters:  11/14/20 (!) 382 lb (173.3 kg)  09/15/20 (!) 399 lb (181 kg)  04/10/20 (!) 310 lb (140.6 kg)     GEN: Well nourished, well developed in no acute distress HEENT: Normal NECK: No JVD; No carotid bruits LYMPHATICS: No lymphadenopathy CARDIAC: S1S2 noted,RRR, no murmurs,  rubs, gallops RESPIRATORY:  Clear to auscultation without rales, wheezing or rhonchi  ABDOMEN: Soft, non-tender, non-distended, +bowel sounds, no guarding. EXTREMITIES: No edema, No cyanosis, no clubbing MUSCULOSKELETAL:  No deformity  SKIN: Warm and dry NEUROLOGIC:  Alert and oriented x 3, non-focal PSYCHIATRIC:  Normal affect, good insight  ASSESSMENT:    1. Primary hypertension   2. Essential hypertension   3. OSA (obstructive sleep apnea)   4. Morbid obesity due to excess calories ([place])   5. Cigarette smoker    PLAN:     His blood pressure has improved but not ideal but not less than 130/80 mmHg I think you are still room to improve.  What I like to do is increase his hydralazine to 50 mg twice a day.  He will remain on his other antihypertensive medication which includes Aldactone 25 mg daily, labetalol 200 mg twice a day, losartan 50 mg daily, amlodipine 10 mg daily as well as Lasix 40 mg daily.  Reviewed his echocardiogram results which showed normal systolic function with diastolic dysfunction explained to the patient what this  means.  He is aware of the mile dilatation of the aorta.  The patient understands the need to lose weight with diet and exercise. We have discussed specific strategies for this.  Smoking cessation advised.  The patient is in agreement with the above plan. The patient left the office in stable condition.  The patient will follow up in 3 months or sooner if needed.  Medication Adjustments/Labs and Tests Ordered: Current medicines are reviewed at length with the patient today.  Concerns regarding medicines are outlined above.  No orders of the defined types were placed in this encounter.  No orders of the defined types were placed in this encounter.   Patient Instructions  Medication Instructions:  Your physician has recommended you make the following change in your medication:  INCREASE: Hydrochlorothiazide 50 mg twice daily  *If you need a refill on your cardiac medications before your next appointment, please call your pharmacy*   Lab Work: None If you have labs (blood work) drawn today and your tests are completely normal, you will receive your results only by: Marland Kitchen MyChart Message (if you have MyChart) OR . A paper copy in the mail If you have any lab test that is abnormal or we need to change your treatment, we will call you to review the results.   Testing/Procedures: None   Follow-Up: At [facility], you and your health needs are our priority.  As part of our continuing mission to provide you with exceptional heart care, we have created designated Provider Care Teams.  These Care Teams include your primary Cardiologist (physician) and Advanced Practice Providers (APPs -  Physician Assistants and Nurse Practitioners) who all work together to provide you with the care you need, when you need it.  We recommend signing up for the patient portal called "MyChart".  Sign up information is provided on this After Visit Summary.  MyChart is used to connect with patients for Virtual  Visits (Telemedicine).  Patients are able to view lab/test results, encounter notes, upcoming appointments, etc.  Non-urgent messages can be sent to your provider as well.   To learn more about what you can do with MyChart, go to [email or web address].    Your next appointment:   3 month(s)  The format for your next appointment:   In Person  Provider:   Berniece Salines, DO   Other Instructions     Adopting a  Healthy Lifestyle.  Know what a healthy weight is for you (roughly BMI <25) and aim to maintain this   Aim for 7+ servings of fruits and vegetables daily   65-80+ fluid ounces of water or unsweet tea for healthy kidneys   Limit to max 1 drink of alcohol per day; avoid smoking/tobacco   Limit animal fats in diet for cholesterol and heart health - choose grass fed whenever available   Avoid highly processed foods, and foods high in saturated/trans fats   Aim for low stress - take time to unwind and care for your mental health   Aim for 150 min of moderate intensity exercise weekly for heart health, and weights twice weekly for bone health   Aim for 7-9 hours of sleep daily   When it comes to diets, agreement about the perfect plan isnt easy to find, even among the experts. Experts at the [employer] developed an idea known as the Healthy Eating Plate. Just imagine a plate divided into logical, healthy portions.   The emphasis is on diet quality:   Load up on vegetables and fruits - one-half of your plate: Aim for color and variety, and remember that potatoes dont count.   Go for whole grains - one-quarter of your plate: Whole wheat, barley, wheat berries, quinoa, oats, brown rice, and foods made with them. If you want pasta, go with whole wheat pasta.   Protein power - one-quarter of your plate: Fish, chicken, beans, and nuts are all healthy, versatile protein sources. Limit red meat.   The diet, however, does go beyond the plate, offering a few  other suggestions.   Use healthy plant oils, such as olive, canola, soy, corn, sunflower and peanut. Check the labels, and avoid partially hydrogenated oil, which have unhealthy trans fats.   If youre thirsty, drink water. Coffee and tea are good in moderation, but skip sugary drinks and limit milk and dairy products to one or two daily servings.   The type of carbohydrate in the diet is more important than the amount. Some sources of carbohydrates, such as vegetables, fruits, whole grains, and beans-are healthier than others.   Finally, stay active  Signed, Berniece Salines, DO  11/14/2020 1:46 PM    [facility]

## 2020-11-14 NOTE — Patient Instructions (Addendum)
Medication Instructions:  Your physician has recommended you make the following change in your medication:  INCREASE: Hydralazine 50 mg twice daily  *If you need a refill on your cardiac medications before your next appointment, please call your pharmacy*   Lab Work: None If you have labs (blood work) drawn today and your tests are completely normal, you will receive your results only by: Marland Kitchen MyChart Message (if you have MyChart) OR . A paper copy in the mail If you have any lab test that is abnormal or we need to change your treatment, we will call you to review the results.   Testing/Procedures: None   Follow-Up: At [facility], you and your health needs are our priority.  As part of our continuing mission to provide you with exceptional heart care, we have created designated Provider Care Teams.  These Care Teams include your primary Cardiologist (physician) and Advanced Practice Providers (APPs -  Physician Assistants and Nurse Practitioners) who all work together to provide you with the care you need, when you need it.  We recommend signing up for the patient portal called "MyChart".  Sign up information is provided on this After Visit Summary.  MyChart is used to connect with patients for Virtual Visits (Telemedicine).  Patients are able to view lab/test results, encounter notes, upcoming appointments, etc.  Non-urgent messages can be sent to your provider as well.   To learn more about what you can do with MyChart, go to [email or web address].    Your next appointment:   3 month(s)  The format for your next appointment:   In Person  Provider:   Berniece Salines, DO   Other Instructions

## 2020-11-27 ENCOUNTER — Telehealth: Payer: Self-pay | Admitting: Cardiology

## 2020-11-27 NOTE — Telephone Encounter (Signed)
Pt c/o medication issue:  1. Name of Medication:  hydrALAZINE (APRESOLINE) 50 MG tablet furosemide (LASIX) 40 MG tablet  2. How are you currently taking this medication (dosage and times per day)? As directed   3. Are you having a reaction (difficulty breathing--STAT)? Back/kidney pain  4. What is your medication issue? Patient states his back hurts near his kidneys. He is thinking the increased medication is causing this pain and putting strain on his kidneys.

## 2020-11-27 NOTE — Telephone Encounter (Signed)
I increase his hydralazine at the last visit to 50 mg twice a day if he thinks he is getting back pain from this we can cut it back to 25.  Unfortunately I did not increase his Lasix have him check with his nephrologist and see if they increase the Lasix.  Let him know the problem will be his blood pressure will go back up because the extra dose of hydralazine is helping his blood pressure.

## 2020-11-27 NOTE — Telephone Encounter (Signed)
Spoke with pt and for about 1-2 weeks has noted back pain Pt feels that increasing Hydralazine and initiating Furosemide maybe the culprit causing kidney pain.Per pt swelling is some better and latest B/P  140/80. Will forward to Dr Harriet Masson for review and recommendations ./cy

## 2020-11-28 NOTE — Telephone Encounter (Signed)
Mailbox full unable to leave message Will try later ./cy

## 2020-11-28 NOTE — Telephone Encounter (Signed)
Pt aware of recommendations and will call back if B/P creeps back up with decrease of Hydralazine .Adonis Housekeeper

## 2021-02-11 DIAGNOSIS — K219 Gastro-esophageal reflux disease without esophagitis: Secondary | ICD-10-CM | POA: Insufficient documentation

## 2021-02-13 ENCOUNTER — Ambulatory Visit: Payer: 59 | Admitting: Cardiology

## 2021-03-10 ENCOUNTER — Other Ambulatory Visit: Payer: Self-pay

## 2021-03-13 ENCOUNTER — Ambulatory Visit: Payer: 59 | Admitting: Cardiology

## 2021-03-16 ENCOUNTER — Other Ambulatory Visit: Payer: Self-pay | Admitting: Cardiology

## 2021-03-17 ENCOUNTER — Telehealth: Payer: Self-pay

## 2021-03-17 MED ORDER — AMLODIPINE BESYLATE 10 MG PO TABS: 10.0000 mg | ORAL_TABLET | Freq: Every day | ORAL | 3 refills | Status: DC

## 2021-03-17 NOTE — Telephone Encounter (Signed)
Rx approved and sent 

## 2021-03-17 NOTE — Telephone Encounter (Signed)
Refill sent to pharmacy.   

## 2021-04-22 NOTE — Progress Notes (Deleted)
04/23/21- 58 yoM Smoker (16 pkyrs) for sleep evaluation- self referred with concern of OSA Medical problem list includes HTN/LVH, Covid infection July 2021/ Acute Resp Failure, GERD, Tobacco Use, Morbid Obesity, Chronic Cough, Peripheral Edema,  Had NPSG/ [facility] 2017>> CPAP/ [employer] Epworth score- Body weight today- Covid vax

## 2021-04-23 ENCOUNTER — Institutional Professional Consult (permissible substitution): Payer: 59 | Admitting: Internal Medicine

## 2021-06-05 ENCOUNTER — Institutional Professional Consult (permissible substitution): Payer: 59 | Admitting: Pulmonary Disease

## 2021-06-28 ENCOUNTER — Other Ambulatory Visit: Payer: Self-pay | Admitting: Cardiology

## 2021-06-29 NOTE — Telephone Encounter (Signed)
Spironolactone 25 mg # 45 only with message patient needs appointment for future refills sent to  [facility], [place] [address] AT [place]

## 2021-09-04 ENCOUNTER — Telehealth: Payer: Self-pay | Admitting: Cardiology

## 2021-09-04 MED ORDER — AMLODIPINE BESYLATE 10 MG PO TABS: 10.0000 mg | ORAL_TABLET | Freq: Every day | ORAL | 0 refills | Status: DC

## 2021-09-04 NOTE — Telephone Encounter (Signed)
*  STAT* If patient is at the pharmacy, call can be transferred to refill team.   1. Which medications need to be refilled? (please list name of each medication and dose if known) amLODipine (NORVASC) 10 MG tablet  labetalol (NORMODYNE) 200 MG tablet  lisinopril (ZESTRIL) 40 MG tablet  spironolactone (ALDACTONE) 25 MG tablet  2. Which pharmacy/location (including street and city if local pharmacy) is medication to be sent to? [facility], [place]  3. Do they need a 30 day or 90 day supply? [place]

## 2021-09-11 ENCOUNTER — Telehealth: Payer: Self-pay | Admitting: Cardiology

## 2021-09-11 MED ORDER — LISINOPRIL 40 MG PO TABS: 40.0000 mg | ORAL_TABLET | Freq: Every day | ORAL | 0 refills | Status: DC

## 2021-09-11 NOTE — Telephone Encounter (Signed)
[place] Overdue for f/u. Appt is in [place]

## 2021-09-11 NOTE — Telephone Encounter (Signed)
°*  STAT* If patient is at the pharmacy, call can be transferred to refill team.   1. Which medications need to be refilled? (please list name of each medication and dose if known) lisinopril (ZESTRIL) 40 MG tablet  2. Which pharmacy/location (including street and city if local pharmacy) is medication to be sent to?[facility], [place]  3. Do they need a 30 day or 90 day supply? 30 day  Patient is out of meds.

## 2021-10-01 ENCOUNTER — Telehealth: Payer: Self-pay | Admitting: Cardiology

## 2021-10-01 ENCOUNTER — Ambulatory Visit: Payer: 59 | Admitting: Cardiology

## 2021-10-01 MED ORDER — FUROSEMIDE 40 MG PO TABS: 40.0000 mg | ORAL_TABLET | Freq: Every day | ORAL | 1 refills | Status: DC

## 2021-10-01 MED ORDER — LISINOPRIL 40 MG PO TABS: 40.0000 mg | ORAL_TABLET | Freq: Every day | ORAL | 0 refills | Status: DC

## 2021-10-01 NOTE — Telephone Encounter (Signed)
°*  STAT* If patient is at the pharmacy, call can be transferred to refill team.   1. Which medications need to be refilled? (please list name of each medication and dose if known) amLODipine (NORVASC) 10 MG tablet; furosemide (LASIX) 40 MG tablet; lisinopril (ZESTRIL) 40 MG tablet  2. Which pharmacy/location (including street and city if local pharmacy) is medication to be sent to?[facility], [place] - [address] AT [place]  3. Do they need a 30 day or 90 day supply? [place]

## 2021-10-19 ENCOUNTER — Other Ambulatory Visit: Payer: Self-pay

## 2021-10-20 MED ORDER — LABETALOL HCL 200 MG PO TABS: 200.0000 mg | ORAL_TABLET | Freq: Two times a day (BID) | ORAL | 3 refills | Status: DC

## 2021-10-26 ENCOUNTER — Other Ambulatory Visit: Payer: Self-pay

## 2021-10-26 ENCOUNTER — Ambulatory Visit: Payer: 59 | Admitting: Cardiology

## 2021-10-26 ENCOUNTER — Ambulatory Visit: Payer: 59

## 2021-10-26 ENCOUNTER — Encounter: Payer: Self-pay | Admitting: Cardiology

## 2021-10-26 VITALS — BP 176/96 | HR 72 | Ht 73.0 in | Wt >= 6400 oz

## 2021-10-26 DIAGNOSIS — R002 Palpitations: Secondary | ICD-10-CM

## 2021-10-26 DIAGNOSIS — R0602 Shortness of breath: Secondary | ICD-10-CM

## 2021-10-26 DIAGNOSIS — I10 Essential (primary) hypertension: Secondary | ICD-10-CM

## 2021-10-26 DIAGNOSIS — N1831 Chronic kidney disease, stage 3a: Secondary | ICD-10-CM

## 2021-10-26 MED ORDER — SPIRONOLACTONE 25 MG PO TABS: 25.0000 mg | ORAL_TABLET | Freq: Every day | ORAL | 3 refills | Status: DC

## 2021-10-26 MED ORDER — LABETALOL HCL 200 MG PO TABS: 200.0000 mg | ORAL_TABLET | Freq: Two times a day (BID) | ORAL | 3 refills | Status: DC

## 2021-10-26 MED ORDER — HYDRALAZINE HCL 50 MG PO TABS: 75.0000 mg | ORAL_TABLET | Freq: Two times a day (BID) | ORAL | 3 refills | Status: DC

## 2021-10-26 MED ORDER — FUROSEMIDE 40 MG PO TABS: 40.0000 mg | ORAL_TABLET | Freq: Every day | ORAL | 3 refills | Status: DC

## 2021-10-26 MED ORDER — AMLODIPINE BESYLATE 10 MG PO TABS: 10.0000 mg | ORAL_TABLET | Freq: Every day | ORAL | 3 refills | Status: DC

## 2021-10-26 MED ORDER — LOSARTAN POTASSIUM 50 MG PO TABS: ORAL_TABLET | ORAL | 3 refills | Status: DC

## 2021-10-26 NOTE — Progress Notes (Signed)
Cardiology Office Note:    Date:  10/27/2021   ID:  Calvin Bailey, DOB [birth year], MRN [id number]  PCP:  Patient, No Pcp Per (Inactive)  Cardiologist:  Berniece Salines, DO  Electrophysiologist:  None   Referring MD: No ref. provider found   "I am doing fine"  History of Present Illness:    Calvin Bailey is a 41 y.o. male with a hx of  hypertension,tobacco use, CKD, OSA , dyslipidemia presents today for a follow up.   I did see patient on 06/01/2019 in an initial evaluation. At which time he was on taking carvedilol 25 mg twice daily, amlodipine 10 mg daily, and Lasix 40mg  daily. During his visit his blood pressure was elevated and he did have bilateral leg edema. He medications were modified: Amlodipine was decreased to 5mg  due to concern for leg edema and Losartan 50mg  was added.    He follow up on 06/08/2019 at which time aldactone 12.5mg  was added to the patient's regimen.  He came for a bp check visit on 07/06/2019 given his elevated blood pressure his aldactone was increased to 25mg  daily   On July 12, 2019 during that visit hydralazine 5 mg twice a day was added and he was advised that it was okay to take his Lasix on a as needed basis.   I saw the patient back in November 2020 at that time his blood pressures were still not under 130/80 I started patient on labetalol twice a day.  He remain on Aldactone, amlodipine, losartan and hydralazine twice daily.  We asked for 2 months follow-up unfortunately the patient had not follow-up.  His follow-up today has been prompted due to refill request and the fact that the patient had not been seen over 1 year.  At his visit on November 14, 2020 his blood pressure had improved but not still at target.  I will increase his hydralazine to 50 mg twice a day, he remains on Aldactone 25 mg daily, labetalol 200 mg twice a day, losartan 50 mg daily, amlodipine 10 mg daily as well as Lasix 40 mg daily.  He is here today for a follow up visit. He  tells me that he has not been taking his medication as prescribed because he ran out. He tells me that he has been experiencing intermittent palpitations. He has had some shortness of breath but not often.    Past Medical History:  Diagnosis Date   Dizziness 09/25/2019   GERD (gastroesophageal reflux disease)    HTN (hypertension)    Obesity hypoventilation syndrome ([place]) 04/08/2014   OSA (obstructive sleep apnea) 04/08/2014   Sleep apnea     Past Surgical History:  Procedure Laterality Date   two knee surgeries Left 1995    Current Medications: Current Meds  Medication Sig   furosemide (LASIX) 40 MG tablet Take 1 tablet (40 mg total) by mouth daily.   hydrALAZINE (APRESOLINE) 50 MG tablet Take 1.5 tablets (75 mg total) by mouth in the morning and at bedtime.   [DISCONTINUED] amLODipine (NORVASC) 10 MG tablet Take 1 tablet (10 mg total) by mouth daily.   [DISCONTINUED] hydrALAZINE (APRESOLINE) 50 MG tablet Take 1 tablet (50 mg total) by mouth in the morning and at bedtime.   [DISCONTINUED] labetalol (NORMODYNE) 200 MG tablet Take 1 tablet (200 mg total) by mouth 2 (two) times daily.   [DISCONTINUED] losartan (COZAAR) 50 MG tablet TAKE 1 TABLET(50 MG) BY MOUTH DAILY   [DISCONTINUED] spironolactone (ALDACTONE) 25 MG tablet  Take 1 tablet (25 mg total) by mouth daily. TAKE 1/2 TABLET BY MOUTH EVERY DAY / Needs appointment for future refills (Patient taking differently: Take 25 mg by mouth daily.)     Allergies:   Chlorthalidone and Lisinopril   Social History   Socioeconomic History   Marital status: Single    Spouse name: Not on file   Number of children: 1   Years of education: college   Highest education level: Not on file  Occupational History   Occupation: unemployment    Employer: UNEMPLOYED    Employer: [employer]  Tobacco Use   Smoking status: Every Day    Packs/day: 1.00    Years: 16.00    Pack years: 16.00    Types: Cigarettes   Smokeless tobacco: Never   Substance and Sexual Activity   Alcohol use: Yes    Alcohol/week: 0.0 standard drinks    Comment: sparingly    Drug use: No   Sexual activity: Yes    Partners: Female  Other Topics Concern   Not on file  Social History Narrative   Nature conservation officer   Patient is single and lives alone.   Patient has one child.   Patient works at [employer].   Patient has a college education.   Patient is left-handed.   Patient does not drink any caffeine.   Social Determinants of Health   Financial Resource Strain: Not on file  Food Insecurity: Not on file  Transportation Needs: Not on file  Physical Activity: Not on file  Stress: Not on file  Social Connections: Not on file     Family History: The patient's family history includes CVA in his maternal uncle; Diabetes in his maternal grandmother; Heart failure in his maternal grandfather and paternal grandfather; Hyperlipidemia in his paternal grandfather; Hypertension in his brother, father, and mother; Multiple sclerosis in his mother.  ROS:   Review of Systems  Constitution: Negative for decreased appetite, fever and weight gain.  HENT: Negative for congestion, ear discharge, hoarse voice and sore throat.   Eyes: Negative for discharge, redness, vision loss in right eye and visual halos.  Cardiovascular: Negative for chest pain, dyspnea on exertion, leg swelling, orthopnea and palpitations.  Respiratory: Negative for cough, hemoptysis, shortness of breath and snoring.   Endocrine: Negative for heat intolerance and polyphagia.  Hematologic/Lymphatic: Negative for bleeding problem. Does not bruise/bleed easily.  Skin: Negative for flushing, nail changes, rash and suspicious lesions.  Musculoskeletal: Negative for arthritis, joint pain, muscle cramps, myalgias, neck pain and stiffness.  Gastrointestinal: Negative for abdominal pain, bowel incontinence, diarrhea and excessive appetite.  Genitourinary: Negative for decreased libido,  genital sores and incomplete emptying.  Neurological: Negative for brief paralysis, focal weakness, headaches and loss of balance.  Psychiatric/Behavioral: Negative for altered mental status, depression and suicidal ideas.  Allergic/Immunologic: Negative for HIV exposure and persistent infections.    EKGs/Labs/Other Studies Reviewed:    The following studies were reviewed today:   EKG:  The ekg ordered today demonstrates sinus rhythm, heart rate .  Echocardiogram October 23, 2020 IMPRESSIONS     1. Left ventricular ejection fraction, by estimation, is 60 to 65%. The  left ventricle has normal function. The left ventricle has no regional  wall motion abnormalities. Left ventricular diastolic parameters are  consistent with Grade I diastolic  dysfunction (impaired relaxation).   2. Right ventricular systolic function is normal. The right ventricular  size is normal.   3. Left atrial size was moderately dilated.  4. The mitral valve is normal in structure. No evidence of mitral valve  regurgitation. No evidence of mitral stenosis.   5. The aortic valve is normal in structure. Aortic valve regurgitation is  mild. No aortic stenosis is present.   6. There is mild dilatation of the ascending aorta, measuring 38 mm.   7. The inferior vena cava is normal in size with greater than 50%  respiratory variability, suggesting right atrial pressure of 3 mmHg.   Comparison(s): EF 55-60%, LVH.   FINDINGS   Left Ventricle: Left ventricular ejection fraction, by estimation, is 60  to 65%. The left ventricle has normal function. The left ventricle has no  regional wall motion abnormalities. Definity contrast agent was given IV  to delineate the left ventricular   endocardial borders. The left ventricular internal cavity size was normal  in size. There is borderline left ventricular hypertrophy. Left  ventricular diastolic parameters are consistent with Grade I diastolic  dysfunction (impaired  relaxation).   Right Ventricle: The right ventricular size is normal. No increase in  right ventricular wall thickness. Right ventricular systolic function is  normal.   Left Atrium: Left atrial size was moderately dilated.   Right Atrium: Right atrial size was normal in size.   Pericardium: There is no evidence of pericardial effusion.   Mitral Valve: The mitral valve is normal in structure. No evidence of  mitral valve regurgitation. No evidence of mitral valve stenosis.   Tricuspid Valve: The tricuspid valve is normal in structure. Tricuspid  valve regurgitation is not demonstrated. No evidence of tricuspid  stenosis.   Aortic Valve: The aortic valve is normal in structure. Aortic valve  regurgitation is mild. No aortic stenosis is present. Aortic valve mean  gradient measures 9.0 mmHg. Aortic valve peak gradient measures 16.3 mmHg.  Aortic valve area, by VTI measures 2.60   cm.   Pulmonic Valve: The pulmonic valve was normal in structure. Pulmonic valve  regurgitation is not visualized. No evidence of pulmonic stenosis.   Aorta: The aortic root is normal in size and structure. There is mild  dilatation of the ascending aorta, measuring 38 mm.   Venous: The inferior vena cava is normal in size with greater than 50%  respiratory variability, suggesting right atrial pressure of 3 mmHg.   IAS/Shunts: No atrial level shunt detected by color flow Doppler.       Recent Labs: 10/26/2021: BUN 19; Creatinine, Ser 1.48; Magnesium 2.0; Potassium 4.1; Sodium 140  Recent Lipid Panel    Component Value Date/Time   CHOL 149 05/30/2019 1500   TRIG 394 (H) 04/10/2020 1700   HDL 34 (L) 05/30/2019 1500   CHOLHDL 4.4 05/30/2019 1500   CHOLHDL 5.1 02/23/2011 1223   VLDL 34 02/23/2011 1223   LDLCALC 93 05/30/2019 1500    Physical Exam:    VS:  BP (!) 176/96    Pulse 72    Ht 6\' 1"  (1.854 m)    Wt (!) 400 lb 12.8 oz (181.8 kg)    BMI 52.88 kg/m     Wt Readings from Last 3  Encounters:  10/26/21 (!) 400 lb 12.8 oz (181.8 kg)  11/14/20 (!) 382 lb (173.3 kg)  09/15/20 (!) 399 lb (181 kg)     GEN: Well nourished, well developed in no acute distress HEENT: Normal NECK: No JVD; No carotid bruits LYMPHATICS: No lymphadenopathy CARDIAC: S1S2 noted,RRR, no murmurs, rubs, gallops RESPIRATORY:  Clear to auscultation without rales, wheezing or rhonchi  ABDOMEN: Soft,  non-tender, non-distended, +bowel sounds, no guarding. EXTREMITIES: No edema, No cyanosis, no clubbing MUSCULOSKELETAL:  No deformity  SKIN: Warm and dry NEUROLOGIC:  Alert and oriented x 3, non-focal PSYCHIATRIC:  Normal affect, good insight  ASSESSMENT:    1. Hypertension, unspecified type   2. SOB (shortness of breath)   3. Palpitations   4. Morbid obesity due to excess calories (HCC)   5. Stage 3a chronic kidney disease ([place])    PLAN:     He is hypertensive in the office today.  He admits that he has not been taking his medication as prescribed.  He ran out of his medication however did not notify the office.  I had a very in-depth discussion with the patient today.  I emphasized to him about the fact that it is very important that he takes his medication as prescribed for [place] to know what is working to help control his blood pressure.  I explained to the patient about the devastating complication of hypertension if not controlled which includes heart failure, stroke, heart attacks as well as kidney failure.  He seems to have an insight after discussing this in plans to do better with his blood pressure.  For now we will increase his hydralazine to and send refills for the rest of his antihypertensive medications.  We also talked about weight loss.  He wants to start working on this.  Going to refer the patient to our heart care Pharm.D. to discuss medical management for his obesity.  Blood Placencia monitor on the patient to understand more about his palpitation making sure atrial fibrillation  is not playing a role here.  He has no chronic kidney disease.  Advised to avoid nephrotoxins.  The patient is in agreement with the above plan. The patient left the office in stable condition.  The patient will follow up in 3 months due to medication change.    Medication Adjustments/Labs and Tests Ordered: Current medicines are reviewed at length with the patient today.  Concerns regarding medicines are outlined above.  Orders Placed This Encounter  Procedures   HgB Q2V   Basic Metabolic Panel (BMET)   Magnesium   AMB Referral to Heartcare Pharm-D   LONG TERM MONITOR (3-14 DAYS)   EKG 12-Lead   Meds ordered this encounter  Medications   hydrALAZINE (APRESOLINE) 50 MG tablet    Sig: Take 1.5 tablets (75 mg total) by mouth in the morning and at bedtime.    Dispense:  270 tablet    Refill:  3   losartan (COZAAR) 50 MG tablet    Sig: TAKE 1 TABLET(50 MG) BY MOUTH DAILY    Dispense:  90 tablet    Refill:  3   amLODipine (NORVASC) 10 MG tablet    Sig: Take 1 tablet (10 mg total) by mouth daily.    Dispense:  90 tablet    Refill:  3   labetalol (NORMODYNE) 200 MG tablet    Sig: Take 1 tablet (200 mg total) by mouth 2 (two) times daily.    Dispense:  180 tablet    Refill:  3   furosemide (LASIX) 40 MG tablet    Sig: Take 1 tablet (40 mg total) by mouth daily. TAKE 1 TABLET(40 MG) BY MOUTH DAILY    Dispense:  90 tablet    Refill:  3   spironolactone (ALDACTONE) 25 MG tablet    Sig: Take 1 tablet (25 mg total) by mouth daily.    Dispense:  90  tablet    Refill:  3    Patient Instructions  Medication Instructions:  Your physician has recommended you make the following change in your medication:  INCREASE: Hydralazine 75 mg (one and a half tablet) twice daily Refilled cardiac meds. *If you need a refill on your cardiac medications before your next appointment, please call your pharmacy*   Lab Work: Your physician recommends that you return for lab work in:  TODAY: BMET,  [place], HgbA1c If you have labs (blood work) drawn today and your tests are completely normal, you will receive your results only by: [place] (if you have [facility]) OR A paper copy in the mail If you have any lab test that is abnormal or we need to change your treatment, we will call you to review the results.   Testing/Procedures: [place] [place]- Long Term Monitor Instructions  Your physician has requested you wear a ZIO patch monitor for 14 days.  This is a single patch monitor. [employer] supplies one patch monitor per enrollment. Additional stickers are not available. Please do not apply patch if you will be having a Nuclear Stress Test,  Echocardiogram, Cardiac CT, MRI, or Chest Xray during the period you would be wearing the  monitor. The patch cannot be worn during these tests. You cannot remove and re-apply the  ZIO XT patch monitor.  Your ZIO patch monitor will be mailed 3 day USPS to your address on file. It may take 3-5 days  to receive your monitor after you have been enrolled.  Once you have received your monitor, please review the enclosed instructions. Your monitor  has already been registered assigning a specific monitor serial # to you.  Billing and Patient Assistance Program Information  We have supplied [employer] with any of your insurance information on file for billing purposes. [employer] offers a sliding scale Patient Assistance Program for patients that do not have  insurance, or whose insurance does not completely cover the cost of the ZIO monitor.  You must apply for the Patient Assistance Program to qualify for this discounted rate.  To apply, please call [employer] at [phone], select option 4, select option 2, ask to apply for  Patient Assistance Program. Theodore Demark will ask your household income, and how many people  are in your household. They will quote your out-of-pocket cost based on that information.  [employer] will also be able to set up a 78-month, interest-free  payment plan if needed.  Applying the monitor   Shave hair from upper left chest.  Hold abrader disc by orange tab. Rub abrader in 40 strokes over the upper left chest as  indicated in your monitor instructions.  Clean area with 4 enclosed alcohol pads. Let dry.  Apply patch as indicated in monitor instructions. Patch will be placed under collarbone on left  side of chest with arrow pointing upward.  Rub patch adhesive wings for 2 minutes. Remove white label marked "1". Remove the white  label marked "2". Rub patch adhesive wings for 2 additional minutes.  While looking in a mirror, press and release button in center of patch. A small green light will  flash 3-4 times. This will be your only indicator that the monitor has been turned on.  Do not shower for the first 24 hours. You may shower after the first 24 hours.  Press the button if you feel a symptom. You will hear a small click. Record Date, Time and  Symptom in the Patient Logbook.  When you are  ready to remove the patch, follow instructions on the last 2 pages of Patient  Logbook. Stick patch monitor onto the last page of Patient Logbook.  Place Patient Logbook in the blue and white box. Use locking tab on box and tape box closed  securely. The blue and white box has prepaid postage on it. Please place it in the mailbox as  soon as possible. Your physician should have your test results approximately 7 days after the  monitor has been mailed back to [facility].  Call [place] at [phone] if you have questions regarding  your ZIO XT patch monitor. Call them immediately if you see an orange light blinking on your  monitor.  If your monitor falls off in less than 4 days, contact our Monitor department at [phone].  If your monitor becomes loose or falls off after 4 days call [employer] at [phone] for  suggestions on securing your monitor    Follow-Up: At [facility], you and your health  needs are our priority.  As part of our continuing mission to provide you with exceptional heart care, we have created designated Provider Care Teams.  These Care Teams include your primary Cardiologist (physician) and Advanced Practice Providers (APPs -  Physician Assistants and Nurse Practitioners) who all work together to provide you with the care you need, when you need it.  We recommend signing up for the patient portal called "MyChart".  Sign up information is provided on this After Visit Summary.  MyChart is used to connect with patients for Virtual Visits (Telemedicine).  Patients are able to view lab/test results, encounter notes, upcoming appointments, etc.  Non-urgent messages can be sent to your provider as well.   To learn more about what you can do with MyChart, go to [email or web address].    Your next appointment:   3 month(s)  The format for your next appointment:   In Person  Provider:   Berniece Salines, DO     Other Instructions  Please take your blood pressure daily, record along with heart rate. Send a MyChart message in 2 week with readings.    Adopting a Healthy Lifestyle.  Know what a healthy weight is for you (roughly BMI <25) and aim to maintain this   Aim for 7+ servings of fruits and vegetables daily   65-80+ fluid ounces of water or unsweet tea for healthy kidneys   Limit to max 1 drink of alcohol per day; avoid smoking/tobacco   Limit animal fats in diet for cholesterol and heart health - choose grass fed whenever available   Avoid highly processed foods, and foods high in saturated/trans fats   Aim for low stress - take time to unwind and care for your mental health   Aim for 150 min of moderate intensity exercise weekly for heart health, and weights twice weekly for bone health   Aim for 7-9 hours of sleep daily   When it comes to diets, agreement about the perfect plan isnt easy to find, even among the experts. Experts at the [facility] developed an idea known as the Healthy Eating Plate. Just imagine a plate divided into logical, healthy portions.   The emphasis is on diet quality:   Load up on vegetables and fruits - one-half of your plate: Aim for color and variety, and remember that potatoes dont count.   Go for whole grains - one-quarter of your plate: Whole wheat, barley, wheat berries, quinoa, oats, brown rice,  and foods made with them. If you want pasta, go with whole wheat pasta.   Protein power - one-quarter of your plate: Fish, chicken, beans, and nuts are all healthy, versatile protein sources. Limit red meat.   The diet, however, does go beyond the plate, offering a few other suggestions.   Use healthy plant oils, such as olive, canola, soy, corn, sunflower and peanut. Check the labels, and avoid partially hydrogenated oil, which have unhealthy trans fats.   If youre thirsty, drink water. Coffee and tea are good in moderation, but skip sugary drinks and limit milk and dairy products to one or two daily servings.   The type of carbohydrate in the diet is more important than the amount. Some sources of carbohydrates, such as vegetables, fruits, whole grains, and beans-are healthier than others.   Finally, stay active  Signed, Berniece Salines, DO  10/27/2021 6:54 PM    [facility]

## 2021-10-26 NOTE — Progress Notes (Unsigned)
Enrolled for [employer] to mail a ZIO XT long term holter monitor to the patients address on file.  

## 2021-10-26 NOTE — Patient Instructions (Addendum)
Medication Instructions:  Your physician has recommended you make the following change in your medication:  INCREASE: Hydralazine 75 mg (one and a half tablet) twice daily Refilled cardiac meds. *If you need a refill on your cardiac medications before your next appointment, please call your pharmacy*   Lab Work: Your physician recommends that you return for lab work in:  TODAY: BMET, [place], HgbA1c If you have labs (blood work) drawn today and your tests are completely normal, you will receive your results only by: [place] (if you have [place]) OR A paper copy in the mail If you have any lab test that is abnormal or we need to change your treatment, we will call you to review the results.   Testing/Procedures: [place]- Long Term Monitor Instructions  Your physician has requested you wear a ZIO patch monitor for 14 days.  This is a single patch monitor. [employer] supplies one patch monitor per enrollment. Additional stickers are not available. Please do not apply patch if you will be having a Nuclear Stress Test,  Echocardiogram, Cardiac CT, MRI, or Chest Xray during the period you would be wearing the  monitor. The patch cannot be worn during these tests. You cannot remove and re-apply the  ZIO XT patch monitor.  Your ZIO patch monitor will be mailed 3 day USPS to your address on file. It may take 3-5 days  to receive your monitor after you have been enrolled.  Once you have received your monitor, please review the enclosed instructions. Your monitor  has already been registered assigning a specific monitor serial # to you.  Billing and Patient Assistance Program Information  We have supplied [employer] with any of your insurance information on file for billing purposes. [employer] offers a sliding scale Patient Assistance Program for patients that do not have  insurance, or whose insurance does not completely cover the cost of the ZIO monitor.  You must apply for the Patient  Assistance Program to qualify for this discounted rate.  To apply, please call [employer] at [phone], select option 4, select option 2, ask to apply for  Patient Assistance Program. Theodore Demark will ask your household income, and how many people  are in your household. They will quote your out-of-pocket cost based on that information.  [employer] will also be able to set up a 56-month, interest-free payment plan if needed.  Applying the monitor   Shave hair from upper left chest.  Hold abrader disc by orange tab. Rub abrader in 40 strokes over the upper left chest as  indicated in your monitor instructions.  Clean area with 4 enclosed alcohol pads. Let dry.  Apply patch as indicated in monitor instructions. Patch will be placed under collarbone on left  side of chest with arrow pointing upward.  Rub patch adhesive wings for 2 minutes. Remove white label marked "1". Remove the white  label marked "2". Rub patch adhesive wings for 2 additional minutes.  While looking in a mirror, press and release button in center of patch. A small green light will  flash 3-4 times. This will be your only indicator that the monitor has been turned on.  Do not shower for the first 24 hours. You may shower after the first 24 hours.  Press the button if you feel a symptom. You will hear a small click. Record Date, Time and  Symptom in the Patient Logbook.  When you are ready to remove the patch, follow instructions on the last 2 pages of Patient  Logbook. Stick patch monitor onto the last page of Patient Logbook.  Place Patient Logbook in the blue and white box. Use locking tab on box and tape box closed  securely. The blue and white box has prepaid postage on it. Please place it in the mailbox as  soon as possible. Your physician should have your test results approximately 7 days after the  monitor has been mailed back to [facility].  Call [place] at [phone] if you have questions  regarding  your ZIO XT patch monitor. Call them immediately if you see an orange light blinking on your  monitor.  If your monitor falls off in less than 4 days, contact our Monitor department at [phone].  If your monitor becomes loose or falls off after 4 days call [employer] at [phone] for  suggestions on securing your monitor    Follow-Up: At [facility], you and your health needs are our priority.  As part of our continuing mission to provide you with exceptional heart care, we have created designated Provider Care Teams.  These Care Teams include your primary Cardiologist (physician) and Advanced Practice Providers (APPs -  Physician Assistants and Nurse Practitioners) who all work together to provide you with the care you need, when you need it.  We recommend signing up for the patient portal called "MyChart".  Sign up information is provided on this After Visit Summary.  MyChart is used to connect with patients for Virtual Visits (Telemedicine).  Patients are able to view lab/test results, encounter notes, upcoming appointments, etc.  Non-urgent messages can be sent to your provider as well.   To learn more about what you can do with MyChart, go to [email or web address].    Your next appointment:   3 month(s)  The format for your next appointment:   In Person  Provider:   Berniece Salines, DO     Other Instructions  Please take your blood pressure daily, record along with heart rate. Send a MyChart message in 2 week with readings.

## 2021-10-27 LAB — BASIC METABOLIC PANEL
BUN/Creatinine Ratio: 13 (ref 9–20)
BUN: 19 mg/dL (ref 6–24)
CO2: 24 mmol/L (ref 20–29)
Calcium: 9.4 mg/dL (ref 8.7–10.2)
Chloride: 103 mmol/L (ref 96–106)
Creatinine, Ser: 1.48 mg/dL — ABNORMAL HIGH (ref 0.76–1.27)
Glucose: 138 mg/dL — ABNORMAL HIGH (ref 70–99)
Potassium: 4.1 mmol/L (ref 3.5–5.2)
Sodium: 140 mmol/L (ref 134–144)
eGFR: 60 mL/min/{1.73_m2} (ref >59)

## 2021-10-27 LAB — HEMOGLOBIN A1C
Est. average glucose Bld gHb Est-mCnc: 108 mg/dL
Hgb A1c MFr Bld: 5.4 % (ref 4.8–5.6)

## 2021-10-27 LAB — MAGNESIUM: Magnesium: 2 mg/dL (ref 1.6–2.3)

## 2021-11-24 ENCOUNTER — Ambulatory Visit: Payer: 59

## 2021-11-27 ENCOUNTER — Ambulatory Visit: Payer: 59

## 2021-12-11 ENCOUNTER — Ambulatory Visit: Payer: 59

## 2021-12-11 NOTE — Progress Notes (Deleted)
Patient ID: [name]                 DOB: Sep 30, 1980                      MRN: [id number] ? ? ? ? ?HPI: ?Calvin Bailey is a 41 y.o. male referred by Dr. Harriet Masson to HTN clinic. PMH is significant for LVH, HTN, OSA obesity, smoking, and medication non compliance. ? ?Current HTN meds:  ?Hydralazine 75mg  BID ?Labetalol 200mg  daily ?Furosemide 20mg  daily ?Amlodipine 10mg  daily ?Losartan 50mg  daily ?Spironolactone 25mg  daily ? ? ?Previously tried:  ?BP goal:  ? ?Family History:  ? ?Social History:  ? ?Diet:  ? ?Exercise:  ? ?Home BP readings:  ? ?Wt Readings from Last 3 Encounters:  ?10/26/21 (!) 400 lb 12.8 oz (181.8 kg)  ?11/14/20 (!) 382 lb (173.3 kg)  ?09/15/20 (!) 399 lb (181 kg)  ? ?BP Readings from Last 3 Encounters:  ?10/26/21 (!) 176/96  ?11/14/20 (!) 142/90  ?09/15/20 (!) 150/88  ? ?Pulse Readings from Last 3 Encounters:  ?10/26/21 72  ?11/14/20 64  ?09/15/20 70  ? ? ?Renal function: ?CrCl cannot be calculated (Patient's most recent lab result is older than the maximum 21 days allowed.). ? ?Past Medical History:  ?Diagnosis Date  ? Dizziness 09/25/2019  ? GERD (gastroesophageal reflux disease)   ? HTN (hypertension)   ? Obesity hypoventilation syndrome ([place]) 04/08/2014  ? OSA (obstructive sleep apnea) 04/08/2014  ? Sleep apnea   ? ? ?Current Outpatient Medications on File Prior to Visit  ?Medication Sig Dispense Refill  ? amLODipine (NORVASC) 10 MG tablet Take 1 tablet (10 mg total) by mouth daily. 90 tablet 3  ? furosemide (LASIX) 40 MG tablet Take 1 tablet (40 mg total) by mouth daily. 30 tablet 1  ? furosemide (LASIX) 40 MG tablet Take 1 tablet (40 mg total) by mouth daily. TAKE 1 TABLET(40 MG) BY MOUTH DAILY 90 tablet 3  ? hydrALAZINE (APRESOLINE) 50 MG tablet Take 1.5 tablets (75 mg total) by mouth in the morning and at bedtime. 270 tablet 3  ? labetalol (NORMODYNE) 200 MG tablet Take 1 tablet (200 mg total) by mouth 2 (two) times daily. 180 tablet 3  ? losartan (COZAAR) 50 MG tablet TAKE 1 TABLET(50  MG) BY MOUTH DAILY 90 tablet 3  ? spironolactone (ALDACTONE) 25 MG tablet Take 1 tablet (25 mg total) by mouth daily. 90 tablet 3  ? [DISCONTINUED] LISINOPRIL-HYDROCHLOROTHIAZIDE PO Take by mouth.    ? ?No current facility-administered medications on file prior to visit.  ? ? ?Allergies  ?Allergen Reactions  ? Chlorthalidone Nausea Only  ? Lisinopril Cough  ? ? ? ?Assessment/Plan: ? ?1. Hypertension -   ?

## 2021-12-29 ENCOUNTER — Ambulatory Visit: Payer: 59 | Admitting: Pharmacist

## 2021-12-29 DIAGNOSIS — I10 Essential (primary) hypertension: Secondary | ICD-10-CM

## 2021-12-29 NOTE — Progress Notes (Signed)
Patient ID: [name]                 DOB: 07/06/1981                    MRN: [id number] ? ? ? ? ?HPI: ?Calvin Bailey is a 41 y.o. male patient referred to pharmacy clinic by Dr Harriet Masson to initiate weight loss therapy with GLP1-RA. PMH is significant for obesity, LVH, HTN, and OSA.. Most recent BMI 50.12kg/m2. ? ?Patient presents today in good spirits. Reports he has inconsistent eating habits.  Will go for a few weeks  without eating much and then over eat afterwards. For example, he has lost 20# in last 2 months. ? ?Eats out frequently, typically burgers, chicken wings, pizza. When he cooks at home he will eat more vegetables such as greens, broccoli, spinach, brussell sprouts.  Is avoiding pasta. ? ?Likes juices the best but recently he has been trying to drink mostly water.  He admits to not being physically active. ? ?Patient is not diabetic. ? ?Current weight management medications: n/a ? ?Previously tried meds: n/a ? ?Current meds that may affect weight: n/a ? ?Baseline weight/BMI: 50.12 ? ?Insurance payor: [place], [employer] ? ?Labs: ?Lab Results  ?Component Value Date  ? HGBA1C 5.4 10/26/2021  ? ? ?Wt Readings from Last 1 Encounters:  ?10/26/21 (!) 400 lb 12.8 oz (181.8 kg)  ? ? ?BP Readings from Last 1 Encounters:  ?10/26/21 (!) 176/96  ? ?Pulse Readings from Last 1 Encounters:  ?10/26/21 72  ? ? ?   ?Component Value Date/Time  ? CHOL 149 05/30/2019 1500  ? TRIG 394 (H) 04/10/2020 1700  ? HDL 34 (L) 05/30/2019 1500  ? CHOLHDL 4.4 05/30/2019 1500  ? CHOLHDL 5.1 02/23/2011 1223  ? VLDL 34 02/23/2011 1223  ? LDLCALC 93 05/30/2019 1500  ? ? ?Past Medical History:  ?Diagnosis Date  ? Dizziness 09/25/2019  ? GERD (gastroesophageal reflux disease)   ? HTN (hypertension)   ? Obesity hypoventilation syndrome ([place]) 04/08/2014  ? OSA (obstructive sleep apnea) 04/08/2014  ? Sleep apnea   ? ? ?Current Outpatient Medications on File Prior to Visit  ?Medication Sig Dispense Refill  ? amLODipine (NORVASC) 10 MG tablet Take 1  tablet (10 mg total) by mouth daily. 90 tablet 3  ? furosemide (LASIX) 40 MG tablet Take 1 tablet (40 mg total) by mouth daily. 30 tablet 1  ? furosemide (LASIX) 40 MG tablet Take 1 tablet (40 mg total) by mouth daily. TAKE 1 TABLET(40 MG) BY MOUTH DAILY 90 tablet 3  ? hydrALAZINE (APRESOLINE) 50 MG tablet Take 1.5 tablets (75 mg total) by mouth in the morning and at bedtime. 270 tablet 3  ? labetalol (NORMODYNE) 200 MG tablet Take 1 tablet (200 mg total) by mouth 2 (two) times daily. 180 tablet 3  ? losartan (COZAAR) 50 MG tablet TAKE 1 TABLET(50 MG) BY MOUTH DAILY 90 tablet 3  ? spironolactone (ALDACTONE) 25 MG tablet Take 1 tablet (25 mg total) by mouth daily. 90 tablet 3  ? [DISCONTINUED] LISINOPRIL-HYDROCHLOROTHIAZIDE PO Take by mouth.    ? ?No current facility-administered medications on file prior to visit.  ? ? ?Allergies  ?Allergen Reactions  ? Chlorthalidone Nausea Only  ? Lisinopril Cough  ? ? ? ?Assessment/Plan: ? ?1. Weight loss - Patient is interested in weight loss treatment.  Since patient is not diabetic and has irregular eating habits, recommended starting Wegovy.  Confirmed no personal or family history  of medullary thyroid carcinoma (MTC) or Multiple Endocrine Neoplasia syndrome type 2 (MEN 2).  ? ?Advised patient on common side effects including nausea, diarrhea, dyspepsia, decreased appetite, and fatigue. Counseled patient on reducing meal size and how to titrate medication to minimize side effects. Counseled patient to call if intolerable side effects or if experiencing dehydration, abdominal pain, or dizziness. Patient will adhere to dietary modifications and will target at least 150 minutes of moderate intensity exercise weekly.  ? ?Emphasized importance of small meals and to avoid high fat meals. ? ?Start Wegovy 0.25mg  sq q 4 weeks ? ?Karren Cobble, PharmD, BCACP, [place], CPP ?[address], [address] ?[place], [place], [address] ?Phone: [phone], Fax: [phone]  ? ?

## 2021-12-29 NOTE — Patient Instructions (Signed)
It was nice meeting you today! ? ?We would like to start a new medication called Wegovy for weight loss.  You will inject one 0.25mg  pen once a week ? ?I will complete the prior authorization for you and contact you when it is complete ? ?Once your start the medication, let me know in 4 weeks if you are tolerating it and we can increase to 0.5mg  weekly ? ?Please call with any questions! ? ?Karren Cobble, PharmD, BCACP, [place], CPP ?[address], [address] ?[place], [place], [address] ?Phone: [phone], Fax: [phone]  ? ?

## 2022-01-08 ENCOUNTER — Ambulatory Visit: Payer: 59 | Admitting: Cardiology

## 2022-01-27 ENCOUNTER — Telehealth: Payer: Self-pay

## 2022-01-27 NOTE — Telephone Encounter (Signed)
Initial PA for Calvin Bailey was denied ?

## 2022-01-27 NOTE — Telephone Encounter (Signed)
Pt called in requesting to know if wegovy has been approved ?I completed pa and awaiting determination. I told him I would let you know and he asked for a call back. Routing to dr. Evelene Croon rph ?Calvin Bailey (Key: Calvin Bailey) - [id number] ?Wegovy 0.25MG /0.5ML auto-injectors ?Status: PA Request ?Created: May 3rd, 2023 ?Sent: May 3rd, 2023 ?

## 2022-01-27 NOTE — Telephone Encounter (Signed)
Called and spoke to pt and informed them that the wegovy had been denied when dr. Evelene Croon did it. I will wait on the determination from the pa I completed and call the pt back once the results come in ?

## 2022-01-28 MED ORDER — SEMAGLUTIDE-WEIGHT MANAGEMENT 0.5 MG/0.5ML ~~LOC~~ SOAJ: 0.5000 mg | SUBCUTANEOUS | 0 refills | Status: AC

## 2022-01-28 MED ORDER — SEMAGLUTIDE-WEIGHT MANAGEMENT 1.7 MG/0.75ML ~~LOC~~ SOAJ: 1.7000 mg | SUBCUTANEOUS | 0 refills | Status: DC

## 2022-01-28 MED ORDER — SEMAGLUTIDE-WEIGHT MANAGEMENT 0.25 MG/0.5ML ~~LOC~~ SOAJ: 0.2500 mg | SUBCUTANEOUS | 0 refills | Status: AC

## 2022-01-28 MED ORDER — SEMAGLUTIDE-WEIGHT MANAGEMENT 2.4 MG/0.75ML ~~LOC~~ SOAJ: 2.4000 mg | SUBCUTANEOUS | 0 refills | Status: DC

## 2022-01-28 MED ORDER — SEMAGLUTIDE-WEIGHT MANAGEMENT 1 MG/0.5ML ~~LOC~~ SOAJ: 1.0000 mg | SUBCUTANEOUS | 0 refills | Status: DC

## 2022-01-28 NOTE — Telephone Encounter (Signed)
Called and spoke w/pt to let them know that they were approved for wegovy and rx sent.  ?

## 2022-01-28 NOTE — Addendum Note (Signed)
Addended by: Allean Found on: 01/28/2022 08:35 AM ? ? Modules accepted: Orders ? ?

## 2022-02-05 ENCOUNTER — Ambulatory Visit (INDEPENDENT_AMBULATORY_CARE_PROVIDER_SITE_OTHER): Payer: 59 | Admitting: Cardiology

## 2022-02-05 ENCOUNTER — Encounter: Payer: Self-pay | Admitting: Cardiology

## 2022-02-05 VITALS — BP 190/110 | HR 65 | Ht 74.0 in | Wt 380.2 lb

## 2022-02-05 DIAGNOSIS — I119 Hypertensive heart disease without heart failure: Secondary | ICD-10-CM

## 2022-02-05 DIAGNOSIS — G4733 Obstructive sleep apnea (adult) (pediatric): Secondary | ICD-10-CM

## 2022-02-05 DIAGNOSIS — I10 Essential (primary) hypertension: Secondary | ICD-10-CM | POA: Diagnosis not present

## 2022-02-05 DIAGNOSIS — F17200 Nicotine dependence, unspecified, uncomplicated: Secondary | ICD-10-CM

## 2022-02-05 MED ORDER — LABETALOL HCL 200 MG PO TABS: 200.0000 mg | ORAL_TABLET | Freq: Two times a day (BID) | ORAL | 3 refills | Status: DC

## 2022-02-05 MED ORDER — AMLODIPINE BESYLATE 10 MG PO TABS: 10.0000 mg | ORAL_TABLET | Freq: Every day | ORAL | 3 refills | Status: DC

## 2022-02-05 MED ORDER — SPIRONOLACTONE 25 MG PO TABS: 25.0000 mg | ORAL_TABLET | Freq: Every day | ORAL | 3 refills | Status: DC

## 2022-02-05 MED ORDER — VALSARTAN 160 MG PO TABS: 160.0000 mg | ORAL_TABLET | Freq: Every day | ORAL | 3 refills | Status: DC

## 2022-02-05 MED ORDER — HYDRALAZINE HCL 50 MG PO TABS: 75.0000 mg | ORAL_TABLET | Freq: Two times a day (BID) | ORAL | 3 refills | Status: DC

## 2022-02-05 NOTE — Patient Instructions (Signed)
Medication Instructions:  ?Your physician has recommended you make the following change in your medication:  ?STOP: Losartan  ?START: Valsartan 160 mg once daily ? ?*If you need a refill on your cardiac medications before your next appointment, please call your pharmacy* ? ? ?Lab Work: ?None ?If you have labs (blood work) drawn today and your tests are completely normal, you will receive your results only by: ?MyChart Message (if you have MyChart) OR ?A paper copy in the mail ?If you have any lab test that is abnormal or we need to change your treatment, we will call you to review the results. ? ? ?Testing/Procedures: ?None ? ? ?Follow-Up: ?At [facility], you and your health needs are our priority.  As part of our continuing mission to provide you with exceptional heart care, we have created designated Provider Care Teams.  These Care Teams include your primary Cardiologist (physician) and Advanced Practice Providers (APPs -  Physician Assistants and Nurse Practitioners) who all work together to provide you with the care you need, when you need it. ? ?We recommend signing up for the patient portal called "MyChart".  Sign up information is provided on this After Visit Summary.  MyChart is used to connect with patients for Virtual Visits (Telemedicine).  Patients are able to view lab/test results, encounter notes, upcoming appointments, etc.  Non-urgent messages can be sent to your provider as well.   ?To learn more about what you can do with MyChart, go to [email or web address].   ? ?Your next appointment:   ?2 week(s) with Pharm D, 12 weeks  ? ?The format for your next appointment:   ?In Person ? ?Provider:   ?Berniece Salines, DO   ? ? ?Other Instructions ? ? ?Important Information About [place] ? ? ? ? ?  ?

## 2022-02-05 NOTE — Progress Notes (Signed)
?Cardiology Office Note:   ? ?Date:  02/07/2022  ? ?ID:  Calvin Bailey, DOB 04-Jun-1981, MRN [id number] ? ?PCP:  Patient, No Pcp Per (Inactive)  ?Cardiologist:  Berniece Salines, DO  ?Electrophysiologist:  None  ? ?Referring MD: No ref. provider found  ? ?" I am doing ok" ? ? ?History of Present Illness:   ? ?Calvin Bailey is a 41 y.o. male with a hx of hypertension,tobacco use, CKD, OSA , dyslipidemia presents today for a follow up. ?  ?I did see patient on 06/01/2019 in an initial evaluation. At which time he was on taking carvedilol 25 mg twice daily, amlodipine 10 mg daily, and Lasix 40mg  daily. During his visit his blood pressure was elevated and he did have bilateral leg edema. He medications were modified: Amlodipine was decreased to 5mg  due to concern for leg edema and Losartan 50mg  was added.  ?  ?He follow up on 06/08/2019 at which time aldactone 12.5mg  was added to the patient's regimen.  He came for a bp check visit on 07/06/2019 given his elevated blood pressure his aldactone was increased to 25mg  daily ?  ?On July 12, 2019 during that visit hydralazine 5 mg twice a day was added and he was advised that it was okay to take his Lasix on a as needed basis. ?  ?I saw the patient back in November 2020 at that time his blood pressures were still not under 130/80 I started patient on labetalol twice a day.  He remain on Aldactone, amlodipine, losartan and hydralazine twice daily.  We asked for 2 months follow-up unfortunately the patient had not follow-up.  His follow-up today has been prompted due to refill request and the fact that the patient had not been seen over 1 year. ?  ?At his visit on November 14, 2020 his blood pressure had improved but not still at target.  I will increase his hydralazine to 50 mg twice a day, he remains on Aldactone 25 mg daily, labetalol 200 mg twice a day, losartan 50 mg daily, amlodipine 10 mg daily as well as Lasix 40 mg daily. ?  ?I saw the patient on October 26, 2021 at that  time he was hypertensive.  He had not been taking his medication that was prescribed.  We spoke significantly in great detail about adherence to his medication. I also increased his hydralazine given the extent of his hypertensive during that time.  I referred the patient to our Pharm.D. program for weight loss. ? ?Since I saw the patient he was able to see our pharmacy colleagues he was started on Wegovy 0.2 mg subcu every 4 weeks. ? ?He offers no complaints at this time. ? ?Past Medical History:  ?Diagnosis Date  ? Dizziness 09/25/2019  ? GERD (gastroesophageal reflux disease)   ? HTN (hypertension)   ? Obesity hypoventilation syndrome ([place]) 04/08/2014  ? OSA (obstructive sleep apnea) 04/08/2014  ? Sleep apnea   ? ? ?Past Surgical History:  ?Procedure Laterality Date  ? two knee surgeries Left 1995  ? ? ?Current Medications: ?Current Meds  ?Medication Sig  ? Semaglutide-Weight Management 0.25 MG/0.5ML SOAJ Inject 0.25 mg into the skin once a week for 28 days.  ? [START ON 02/26/2022] Semaglutide-Weight Management 0.5 MG/0.5ML SOAJ Inject 0.5 mg into the skin once a week for 28 days.  ? [START ON 03/27/2022] Semaglutide-Weight Management 1 MG/0.5ML SOAJ Inject 1 mg into the skin once a week for 28 days.  ? [START ON 04/25/2022]  Semaglutide-Weight Management 1.7 MG/0.75ML SOAJ Inject 1.7 mg into the skin once a week for 28 days.  ? [START ON 05/24/2022] Semaglutide-Weight Management 2.4 MG/0.75ML SOAJ Inject 2.4 mg into the skin once a week for 28 days.  ? valsartan (DIOVAN) 160 MG tablet Take 1 tablet (160 mg total) by mouth daily.  ? [DISCONTINUED] amLODipine (NORVASC) 10 MG tablet Take 1 tablet (10 mg total) by mouth daily.  ? [DISCONTINUED] labetalol (NORMODYNE) 200 MG tablet Take 1 tablet (200 mg total) by mouth 2 (two) times daily.  ? [DISCONTINUED] losartan (COZAAR) 50 MG tablet TAKE 1 TABLET(50 MG) BY MOUTH DAILY  ? [DISCONTINUED] spironolactone (ALDACTONE) 25 MG tablet Take 1 tablet (25 mg total) by mouth daily.  ?   ? ?Allergies:   Chlorthalidone and Lisinopril  ? ?Social History  ? ?Socioeconomic History  ? Marital status: Single  ?  Spouse name: Not on file  ? Number of children: 1  ? Years of education: college  ? Highest education level: Not on file  ?Occupational History  ? Occupation: unemployment  ?  Employer: UNEMPLOYED  ?  Employer: [employer]  ?Tobacco Use  ? Smoking status: Every Day  ?  Packs/day: 1.00  ?  Years: 16.00  ?  Pack years: 16.00  ?  Types: Cigarettes  ? Smokeless tobacco: Never  ?Substance and Sexual Activity  ? Alcohol use: Yes  ?  Alcohol/week: 0.0 standard drinks  ?  Comment: sparingly   ? Drug use: No  ? Sexual activity: Yes  ?  Partners: Female  ?Other Topics Concern  ? Not on file  ?Social History Narrative  ? Nature conservation officer  ? Patient is single and lives alone.  ? Patient has one child.  ? Patient works at [employer].  ? Patient has a college education.  ? Patient is left-handed.  ? Patient does not drink any caffeine.  ? ?Social Determinants of Health  ? ?Financial Resource Strain: Not on file  ?Food Insecurity: Not on file  ?Transportation Needs: Not on file  ?Physical Activity: Not on file  ?Stress: Not on file  ?Social Connections: Not on file  ?  ? ?Family History: ?The patient's family history includes CVA in his maternal uncle; Diabetes in his maternal grandmother; Heart failure in his maternal grandfather and paternal grandfather; Hyperlipidemia in his paternal grandfather; Hypertension in his brother, father, and mother; Multiple sclerosis in his mother. ? ?ROS:   ?Review of Systems  ?Constitution: Negative for decreased appetite, fever and weight gain.  ?HENT: Negative for congestion, ear discharge, hoarse voice and sore throat.   ?Eyes: Negative for discharge, redness, vision loss in right eye and visual halos.  ?Cardiovascular: Negative for chest pain, dyspnea on exertion, leg swelling, orthopnea and palpitations.  ?Respiratory: Negative for cough, hemoptysis, shortness  of breath and snoring.   ?Endocrine: Negative for heat intolerance and polyphagia.  ?Hematologic/Lymphatic: Negative for bleeding problem. Does not bruise/bleed easily.  ?Skin: Negative for flushing, nail changes, rash and suspicious lesions.  ?Musculoskeletal: Negative for arthritis, joint pain, muscle cramps, myalgias, neck pain and stiffness.  ?Gastrointestinal: Negative for abdominal pain, bowel incontinence, diarrhea and excessive appetite.  ?Genitourinary: Negative for decreased libido, genital sores and incomplete emptying.  ?Neurological: Negative for brief paralysis, focal weakness, headaches and loss of balance.  ?Psychiatric/Behavioral: Negative for altered mental status, depression and suicidal ideas.  ?Allergic/Immunologic: Negative for HIV exposure and persistent infections.  ? ? ?EKGs/Labs/Other Studies Reviewed:   ? ?The following studies were reviewed today: ? ? ?  EKG:  None today  ? ?Echocardiogram October 23, 2020 ?IMPRESSIONS  ? ? ? 1. Left ventricular ejection fraction, by estimation, is 60 to 65%. The  ?left ventricle has normal function. The left ventricle has no regional  ?wall motion abnormalities. Left ventricular diastolic parameters are  ?consistent with Grade I diastolic  ?dysfunction (impaired relaxation).  ? 2. Right ventricular systolic function is normal. The right ventricular  ?size is normal.  ? 3. Left atrial size was moderately dilated.  ? 4. The mitral valve is normal in structure. No evidence of mitral valve  ?regurgitation. No evidence of mitral stenosis.  ? 5. The aortic valve is normal in structure. Aortic valve regurgitation is  ?mild. No aortic stenosis is present.  ? 6. There is mild dilatation of the ascending aorta, measuring 38 mm.  ? 7. The inferior vena cava is normal in size with greater than 50%  ?respiratory variability, suggesting right atrial pressure of 3 mmHg.  ? ?Comparison(s): EF 55-60%, LVH.  ? ?FINDINGS  ? Left Ventricle: Left ventricular ejection fraction,  by estimation, is 60  ?to 65%. The left ventricle has normal function. The left ventricle has no  ?regional wall motion abnormalities. Definity contrast agent was given IV  ?to delineate the left

## 2022-02-08 ENCOUNTER — Encounter (HOSPITAL_BASED_OUTPATIENT_CLINIC_OR_DEPARTMENT_OTHER): Payer: Self-pay | Admitting: Emergency Medicine

## 2022-02-08 ENCOUNTER — Other Ambulatory Visit: Payer: Self-pay

## 2022-02-08 ENCOUNTER — Emergency Department (HOSPITAL_BASED_OUTPATIENT_CLINIC_OR_DEPARTMENT_OTHER)
Admission: EM | Admit: 2022-02-08 | Discharge: 2022-02-08 | Disposition: A | Discharge status: Home / Self Care | Payer: 59 | Attending: Emergency Medicine | Admitting: Emergency Medicine

## 2022-02-08 ENCOUNTER — Telehealth: Payer: Self-pay | Admitting: Cardiology

## 2022-02-08 ENCOUNTER — Emergency Department (HOSPITAL_BASED_OUTPATIENT_CLINIC_OR_DEPARTMENT_OTHER): Payer: 59

## 2022-02-08 DIAGNOSIS — I10 Essential (primary) hypertension: Secondary | ICD-10-CM | POA: Diagnosis not present

## 2022-02-08 DIAGNOSIS — Z7985 Long-term (current) use of injectable non-insulin antidiabetic drugs: Secondary | ICD-10-CM | POA: Insufficient documentation

## 2022-02-08 DIAGNOSIS — I159 Secondary hypertension, unspecified: Secondary | ICD-10-CM

## 2022-02-08 DIAGNOSIS — Z79899 Other long term (current) drug therapy: Secondary | ICD-10-CM | POA: Diagnosis not present

## 2022-02-08 DIAGNOSIS — R519 Headache, unspecified: Secondary | ICD-10-CM | POA: Diagnosis present

## 2022-02-08 LAB — BASIC METABOLIC PANEL
Anion gap: 11 (ref 5–15)
BUN: 21 mg/dL — ABNORMAL HIGH (ref 6–20)
CO2: 23 mmol/L (ref 22–32)
Calcium: 9.2 mg/dL (ref 8.9–10.3)
Chloride: 106 mmol/L (ref 98–111)
Creatinine, Ser: 1.53 mg/dL — ABNORMAL HIGH (ref 0.61–1.24)
GFR, Estimated: 58 mL/min — ABNORMAL LOW (ref >60)
Glucose, Bld: 154 mg/dL — ABNORMAL HIGH (ref 70–99)
Potassium: 3.8 mmol/L (ref 3.5–5.1)
Sodium: 140 mmol/L (ref 135–145)

## 2022-02-08 LAB — CBC WITH DIFFERENTIAL/PLATELET
Abs Immature Granulocytes: 0.02 10*3/uL (ref 0.00–0.07)
Basophils Absolute: 0 10*3/uL (ref 0.0–0.1)
Basophils Relative: 0 %
Eosinophils Absolute: 0.2 10*3/uL (ref 0.0–0.5)
Eosinophils Relative: 3 %
HCT: 45.7 % (ref 39.0–52.0)
Hemoglobin: 15.3 g/dL (ref 13.0–17.0)
Immature Granulocytes: 0 %
Lymphocytes Relative: 23 %
Lymphs Abs: 1.6 10*3/uL (ref 0.7–4.0)
MCH: 28.7 pg (ref 26.0–34.0)
MCHC: 33.5 g/dL (ref 30.0–36.0)
MCV: 85.7 fL (ref 80.0–100.0)
Monocytes Absolute: 0.3 10*3/uL (ref 0.1–1.0)
Monocytes Relative: 5 %
Neutro Abs: 4.9 10*3/uL (ref 1.7–7.7)
Neutrophils Relative %: 69 %
Platelets: 223 10*3/uL (ref 150–400)
RBC: 5.33 MIL/uL (ref 4.22–5.81)
RDW: 12.9 % (ref 11.5–15.5)
WBC: 7.1 10*3/uL (ref 4.0–10.5)
nRBC: 0 % (ref 0.0–0.2)

## 2022-02-08 IMAGING — CT CT HEAD W/O CM
4 series · 15 of 47 positions shown, 17 images · non-contrast
Comparison: Head CT [DATE].

CLINICAL DATA: 40-year-old male with history of sudden onset of
severe headache lasting for the past 3-4 days.



[Series 2: head wo ax · axial · 0.38mm/px · z∈[-106,+11]mm · 6 of 34 slices shown, 8 images]
[im 5/34  brain]
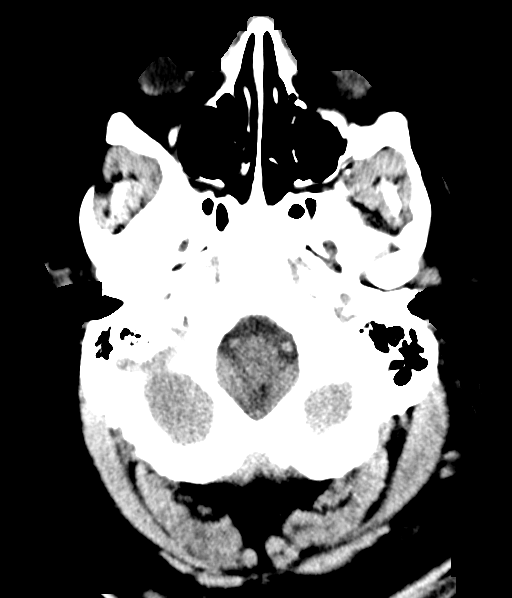
[im 5/34  bone]
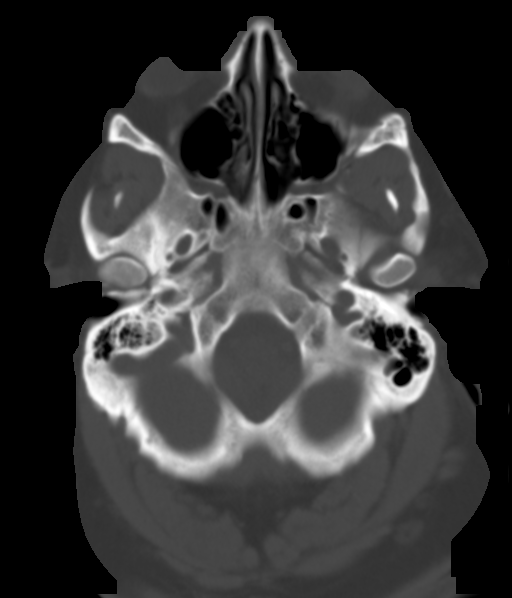
[im 10/34  brain]
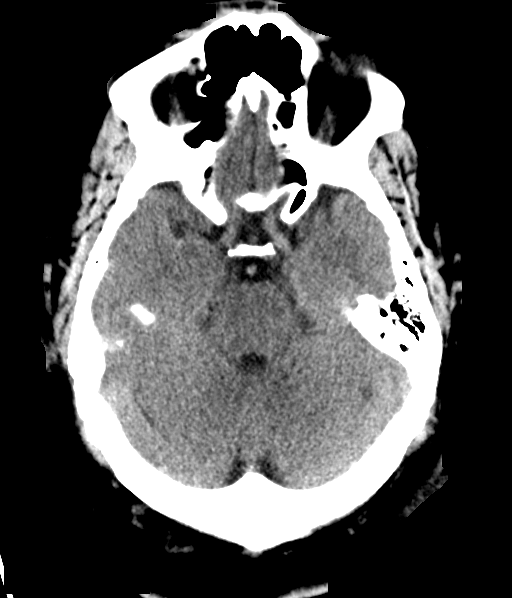
[im 15/34  brain]
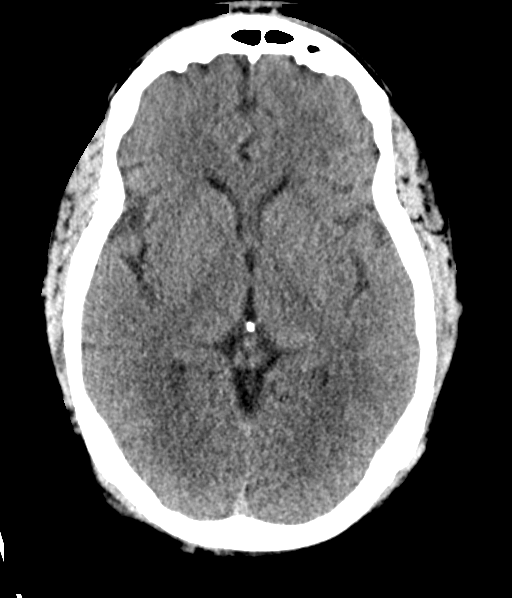
[im 19/34  brain]
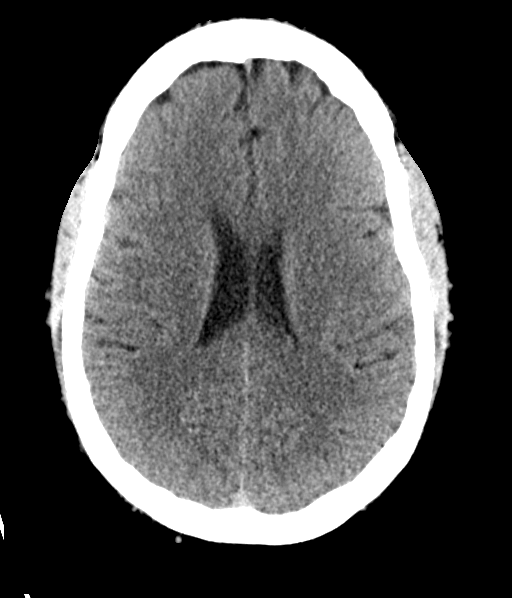
[im 24/34  brain]
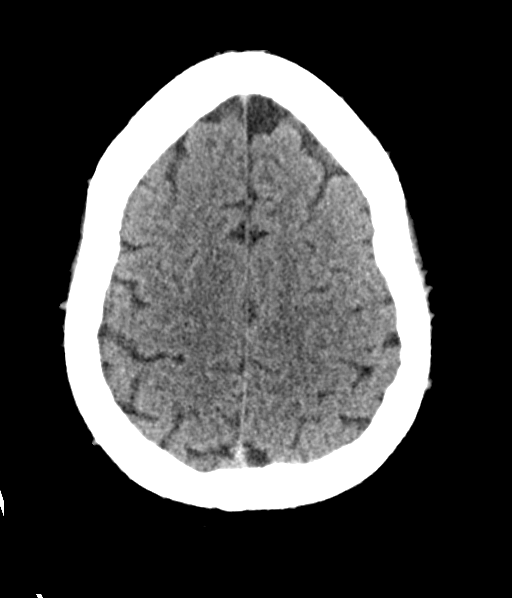
[im 24/34  bone]
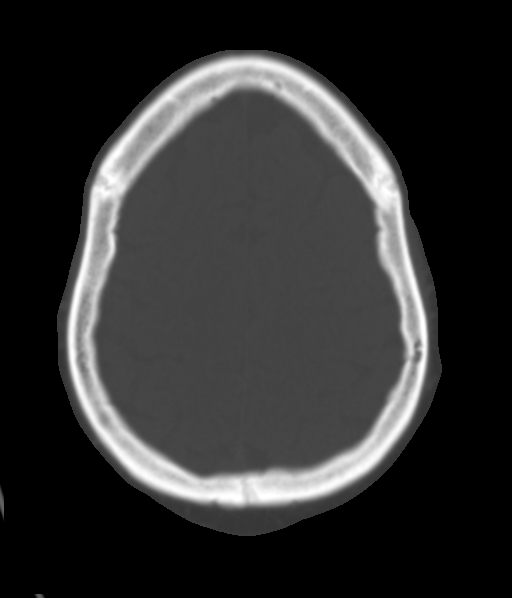
[im 29/34  brain]
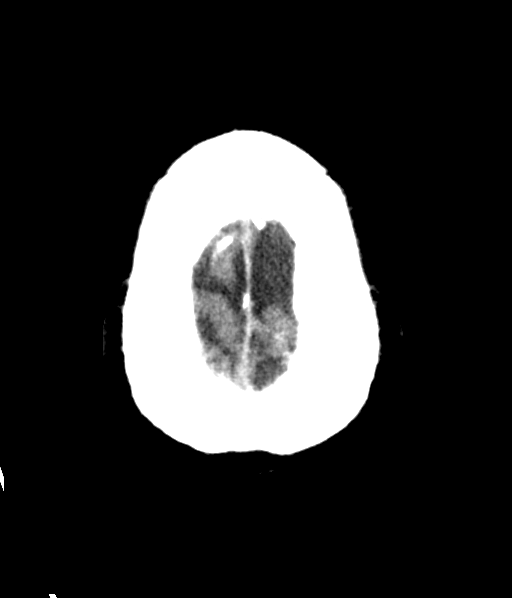

[Series 3: head bone · axial · 0.46mm/px · z∈[-134,-90]mm · 3 of 92 slices shown]
[im 9/92  bone]
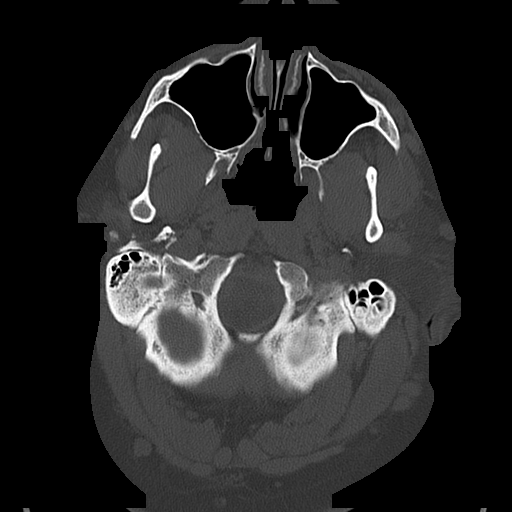
[im 18/92  bone]
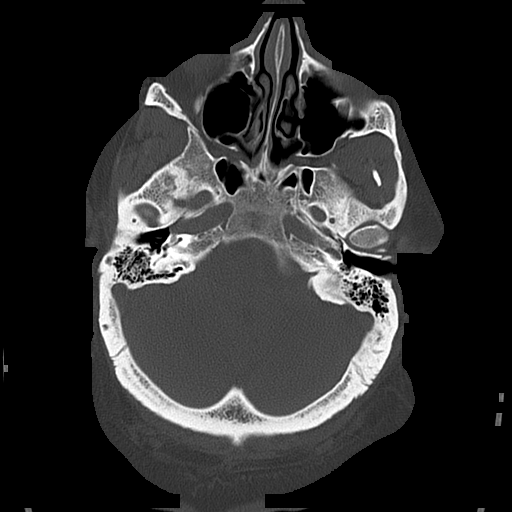
[im 31/92  bone]
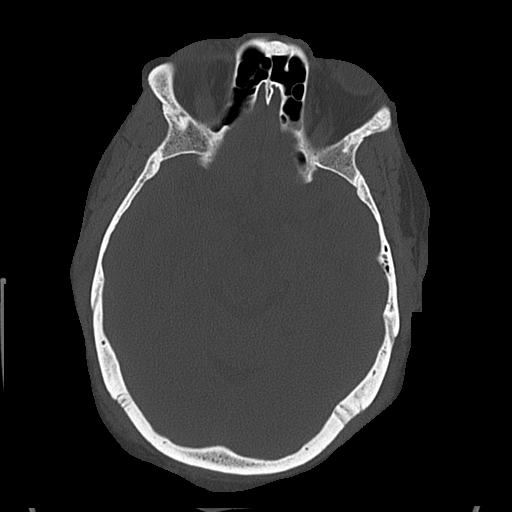

[Series 4: coronal soft · coronal · 0.33mm/px · 3 of 77 slices shown]
[im 26/77  brain]
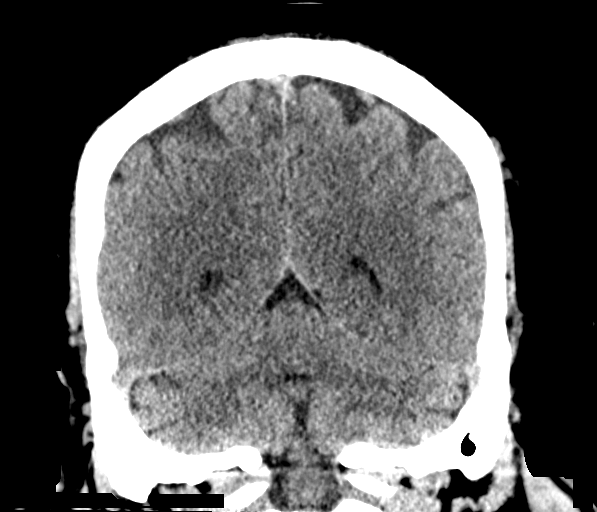
[im 34/77  brain]
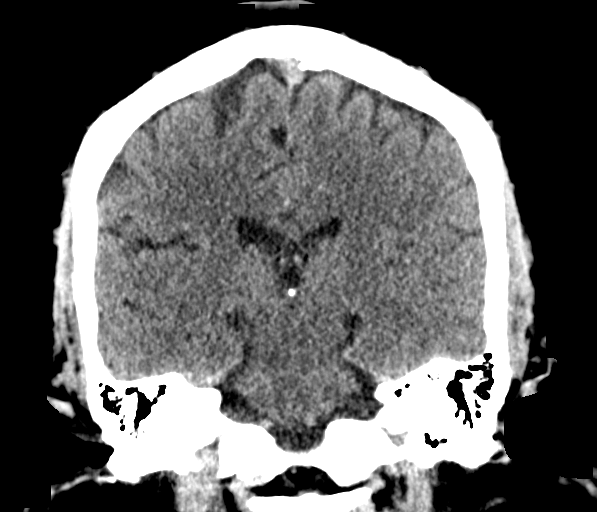
[im 43/77  brain]
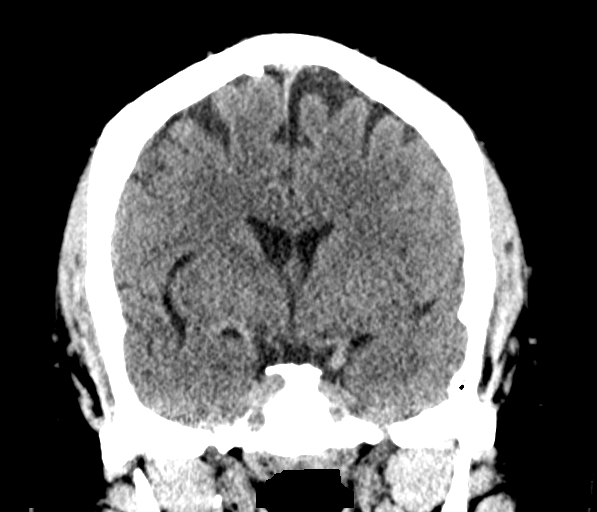

[Series 5: sagittal soft · sagittal · 0.33mm/px · 3 of 66 slices shown]
[im 22/66  brain]
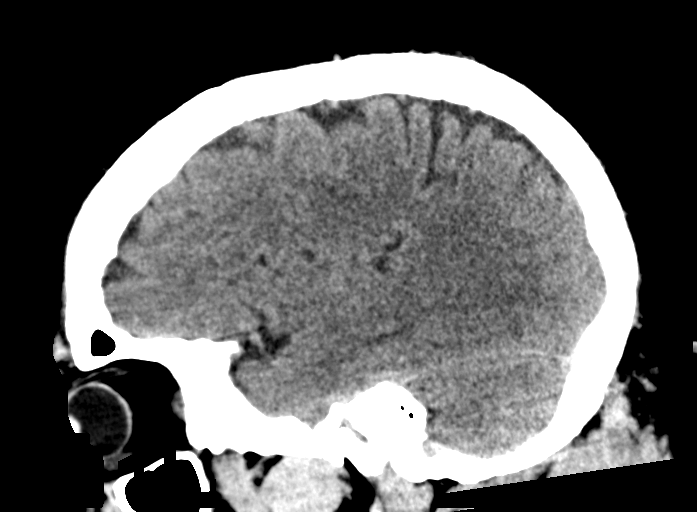
[im 33/66  brain]
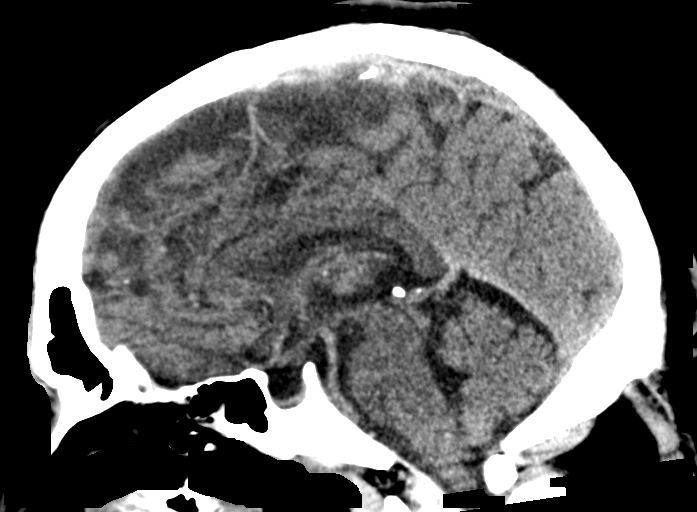
[im 44/66  brain]
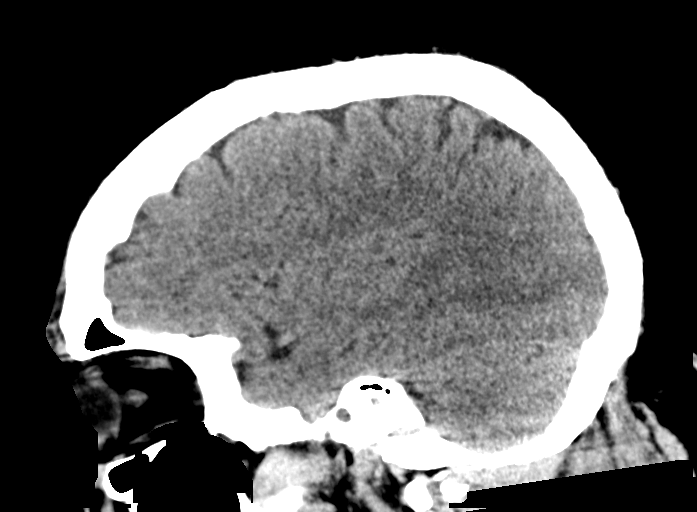

[15 of 47 positions shown; findings below may reference images not displayed]

FINDINGS: Brain: No evidence of acute infarction, hemorrhage, hydrocephalus,
extra-axial collection or mass lesion/mass effect.

Vascular: No hyperdense vessel or unexpected calcification.

Skull: Normal. Negative for fracture or focal lesion.

Sinuses/Orbits: No acute finding.

Other: None.
IMPRESSION: 1. No acute intracranial abnormalities. The appearance of the brain
is normal.

## 2022-02-08 NOTE — Telephone Encounter (Signed)
Patient currently in ED.

## 2022-02-08 NOTE — ED Provider Notes (Signed)
?[facility] ?Provider Note ? ? ?CSN: [id number] ?Arrival date & time: 02/08/22  0844 ? ?  ? ?History ? ?Chief Complaint  ?Patient presents with  ? Hypertension  ? ? ?Calvin Bailey is a 41 y.o. male. ? ?Is here with high blood pressure and headache.  Patient with blood pressure 163/90.  No fever.  Denies any weakness or numbness or chest pain.  No shortness of breath.  No abdominal pain.  Recent changes to his blood pressure medication last week.  Was having some headache and found to have high blood pressure at urgent care/primary care doctor and sent here for evaluation.  He took an extra 25 mg of his hydralazine as he did not take his complete dose this morning.  That has made him feel better.  No active chest pain or belly pain.  History of high blood pressure ? ?The history is provided by the patient.  ? ?  ? ?Home Medications ?Prior to Admission medications   ?Medication Sig Start Date End Date Taking? Authorizing Provider  ?amLODipine (NORVASC) 10 MG tablet Take 1 tablet (10 mg total) by mouth daily. 02/05/22   Tobb, Kardie, DO  ?hydrALAZINE (APRESOLINE) 50 MG tablet Take 1.5 tablets (75 mg total) by mouth in the morning and at bedtime. 02/05/22   Tobb, Kardie, DO  ?labetalol (NORMODYNE) 200 MG tablet Take 1 tablet (200 mg total) by mouth 2 (two) times daily. 02/05/22   Berniece Salines, DO  ?Semaglutide-Weight Management 0.25 MG/0.5ML SOAJ Inject 0.25 mg into the skin once a week for 28 days. 01/28/22 02/25/22  Berniece Salines, DO  ?Semaglutide-Weight Management 0.5 MG/0.5ML SOAJ Inject 0.5 mg into the skin once a week for 28 days. 02/26/22 03/26/22  Berniece Salines, DO  ?Semaglutide-Weight Management 1 MG/0.5ML SOAJ Inject 1 mg into the skin once a week for 28 days. 03/27/22 04/24/22  Berniece Salines, DO  ?Semaglutide-Weight Management 1.7 MG/0.75ML SOAJ Inject 1.7 mg into the skin once a week for 28 days. 04/25/22 05/23/22  Berniece Salines, DO  ?Semaglutide-Weight Management 2.4 MG/0.75ML SOAJ Inject 2.4 mg  into the skin once a week for 28 days. 05/24/22 06/21/22  Tobb, Godfrey Pick, DO  ?spironolactone (ALDACTONE) 25 MG tablet Take 1 tablet (25 mg total) by mouth daily. 02/05/22   Tobb, Kardie, DO  ?valsartan (DIOVAN) 160 MG tablet Take 1 tablet (160 mg total) by mouth daily. 02/05/22   Tobb, Kardie, DO  ?LISINOPRIL-HYDROCHLOROTHIAZIDE PO Take by mouth.  07/06/19  [provider]  ?   ? ?Allergies    ?Chlorthalidone and Lisinopril   ? ?Review of Systems   ?Review of Systems ? ?Physical Exam ?Updated Vital Signs ?BP (!) 142/74   Pulse 66   Temp 97.6 ?F (36.4 ?C) (Oral)   Resp 20   Ht 6\' 2"  (1.88 m)   Wt (!) 174.6 kg   SpO2 98%   BMI 49.43 kg/m?  ?Physical Exam ?Vitals and nursing note reviewed.  ?Constitutional:   ?   General: He is not in acute distress. ?   Appearance: He is well-developed. He is not ill-appearing.  ?HENT:  ?   Head: Normocephalic and atraumatic.  ?   Nose: Nose normal.  ?   Mouth/Throat:  ?   Mouth: Mucous membranes are moist.  ?Eyes:  ?   Extraocular Movements: Extraocular movements intact.  ?   Conjunctiva/sclera: Conjunctivae normal.  ?   Pupils: Pupils are equal, round, and reactive to light.  ?Cardiovascular:  ?   Rate  and Rhythm: Normal rate and regular rhythm.  ?   Pulses: Normal pulses.  ?   Heart sounds: Normal heart sounds. No murmur heard. ?Pulmonary:  ?   Effort: Pulmonary effort is normal. No respiratory distress.  ?   Breath sounds: Normal breath sounds.  ?Abdominal:  ?   Palpations: Abdomen is soft.  ?   Tenderness: There is no abdominal tenderness.  ?Musculoskeletal:     ?   General: No swelling.  ?   Cervical back: Normal range of motion and neck supple.  ?Skin: ?   General: Skin is warm and dry.  ?   Capillary Refill: Capillary refill takes less than 2 seconds.  ?Neurological:  ?   General: No focal deficit present.  ?   Mental Status: He is alert and oriented to person, place, and time.  ?   Cranial Nerves: No cranial nerve deficit.  ?   Sensory: No sensory deficit.  ?    Coordination: Coordination normal.  ?   Comments: 5+ out of 5 strength throughout, normal sensation, no drift, normal finger-nose-finger  ?Psychiatric:     ?   Mood and Affect: Mood normal.  ? ? ?ED Results / Procedures / Treatments   ?Labs ?(all labs ordered are listed, but only abnormal results are displayed) ?Labs Reviewed  ?BASIC METABOLIC PANEL - Abnormal; Notable for the following components:  ?    Result Value  ? Glucose, Bld 154 (*)   ? BUN 21 (*)   ? Creatinine, Ser 1.53 (*)   ? GFR, Estimated 58 (*)   ? All other components within normal limits  ?CBC WITH DIFFERENTIAL/PLATELET  ? ? ?EKG ?EKG Interpretation ? ?Date/Time:  Monday Feb 08 2022 09:32:34 EDT ?Ventricular Rate:  69 ?PR Interval:  202 ?QRS Duration: 115 ?QT Interval:  395 ?QTC Calculation: 424 ?R Axis:   2 ?Text Interpretation: Sinus rhythm Borderline prolonged PR interval Nonspecific intraventricular conduction delay Confirmed by Lennice Sites ([id number]) on 02/08/2022 9:35:26 AM ? ?Radiology ?CT Head Wo Contrast ? ?Result Date: 02/08/2022 ?CLINICAL DATA:  41 year old male with history of sudden onset of severe headache lasting for the past 3-4 days. EXAM: CT HEAD WITHOUT CONTRAST TECHNIQUE: Contiguous axial images were obtained from the base of the skull through the vertex without intravenous contrast. RADIATION DOSE REDUCTION: This exam was performed according to the departmental dose-optimization program which includes automated exposure control, adjustment of the mA and/or kV according to patient size and/or use of iterative reconstruction technique. COMPARISON:  Head CT 09/23/2007. FINDINGS: Brain: No evidence of acute infarction, hemorrhage, hydrocephalus, extra-axial collection or mass lesion/mass effect. Vascular: No hyperdense vessel or unexpected calcification. Skull: Normal. Negative for fracture or focal lesion. Sinuses/Orbits: No acute finding. Other: None. IMPRESSION: 1. No acute intracranial abnormalities. The appearance of the brain is  normal. Electronically Signed   By: Vinnie Langton M.D.   On: 02/08/2022 09:22   ? ?Procedures ?Procedures  ? ? ?Medications Ordered in ED ?Medications - No data to display ? ?ED Course/ Medical Decision Making/ A&P ?  ?                        ?Medical Decision Making ?Amount and/or Complexity of Data Reviewed ?Labs: ordered. ?Radiology: ordered. ? ? ?Calvin Bailey is here with high blood pressure, headache.  History of hypertension, reflux.  Blood pressure upon my evaluation is 157/83.  He took his full dose of hydralazine before coming here.  He only  took a partial dose this morning.  Took his final 25 mg hydralazine.  He has been having some headaches and noticed blood pressure was elevated over the weekend.  Had a new blood pressure medicine started last week.  Denies any numbness or weakness.  No chest pain or shortness of breath.  He is very well-appearing.  Normal neurological exam.  I have a low suspicion for ACS or stroke.  However given headache and high blood pressure we will get a head CT to rule out head bleed.  We will check basic labs to look for any signs of endorgan damage.  EKG shows sinus rhythm.  No ischemic changes.  Doubt any cardiac process.  After some reassurance he starting to feel better. ? ?Head CT shows no acute findings.  No significant anemia, electrolyte abnormality, kidney injury, leukocytosis per my review and interpretation of labs.  Given reassurance and discharged. ? ?This chart was dictated using voice recognition software.  Despite best efforts to proofread,  errors can occur which can change the documentation meaning.  ? ? ? ? ? ? ? ?Final Clinical Impression(s) / ED Diagnoses ?Final diagnoses:  ?Nonintractable headache, unspecified chronicity pattern, unspecified headache type  ?Secondary hypertension  ? ? ?Rx / DC Orders ?ED Discharge Orders   ? ? None  ? ?  ? ? ?  ?Lennice Sites, DO ?02/08/22 1026 ? ?

## 2022-02-08 NOTE — ED Triage Notes (Signed)
Went to PCP Friday and BP was high. He takes BP meds. States he feels "loopy" and also c/o headache.  ?

## 2022-02-08 NOTE — Telephone Encounter (Signed)
Received call from patient c/o elevated blood pressure.   He states his blood pressure has continued to be elevated since seeing Dr. Harriet Masson Friday.  BP readings 191/102, 175/86, 189/110, 193/102, 178/110.   Reports on and off headache, dizziness.   He does report taking his medications and did start valsartan Saturday.  ? ?OV with Dr. Harriet Masson 5/12-concerns about noncompliance, patient reported being out of medication (patient denies running out of medication, states he has been taking them?).    Instructed to stop losartan, start valsartan.    ? ?Patient asymptomatic on phone, no distress noted.  Will discuss with Dr. Harriet Masson and/or pharmD and return call to patient.    ?

## 2022-02-08 NOTE — Telephone Encounter (Signed)
Pt c/o BP issue: STAT if pt c/o blurred vision, one-sided weakness or slurred speech ? ?1. What are your last 5 BP readings? 193/102, today 178/110 yesterday ? ?2. Are you having any other symptoms (ex. Dizziness, headache, blurred vision, passed out)? Little loopy at this time ? ?3. What is your BP issue? Blood pressure is high ? ?

## 2022-02-10 ENCOUNTER — Ambulatory Visit (HOSPITAL_BASED_OUTPATIENT_CLINIC_OR_DEPARTMENT_OTHER): Payer: 59 | Admitting: Family Medicine

## 2022-02-16 ENCOUNTER — Emergency Department (HOSPITAL_BASED_OUTPATIENT_CLINIC_OR_DEPARTMENT_OTHER)
Admission: EM | Admit: 2022-02-16 | Discharge: 2022-02-16 | Disposition: A | Discharge status: Home / Self Care | Payer: 59 | Attending: Emergency Medicine | Admitting: Emergency Medicine

## 2022-02-16 ENCOUNTER — Encounter (HOSPITAL_BASED_OUTPATIENT_CLINIC_OR_DEPARTMENT_OTHER): Payer: Self-pay

## 2022-02-16 ENCOUNTER — Other Ambulatory Visit: Payer: Self-pay

## 2022-02-16 ENCOUNTER — Telehealth: Payer: Self-pay | Admitting: Cardiology

## 2022-02-16 ENCOUNTER — Emergency Department (HOSPITAL_BASED_OUTPATIENT_CLINIC_OR_DEPARTMENT_OTHER): Payer: 59

## 2022-02-16 DIAGNOSIS — R519 Headache, unspecified: Secondary | ICD-10-CM | POA: Diagnosis present

## 2022-02-16 DIAGNOSIS — Z79899 Other long term (current) drug therapy: Secondary | ICD-10-CM | POA: Insufficient documentation

## 2022-02-16 DIAGNOSIS — I10 Essential (primary) hypertension: Secondary | ICD-10-CM | POA: Diagnosis not present

## 2022-02-16 DIAGNOSIS — R42 Dizziness and giddiness: Secondary | ICD-10-CM

## 2022-02-16 LAB — CBC WITH DIFFERENTIAL/PLATELET
Abs Immature Granulocytes: 0.03 10*3/uL (ref 0.00–0.07)
Basophils Absolute: 0.1 10*3/uL (ref 0.0–0.1)
Basophils Relative: 1 %
Eosinophils Absolute: 0.2 10*3/uL (ref 0.0–0.5)
Eosinophils Relative: 4 %
HCT: 45.2 % (ref 39.0–52.0)
Hemoglobin: 15.1 g/dL (ref 13.0–17.0)
Immature Granulocytes: 0 %
Lymphocytes Relative: 22 %
Lymphs Abs: 1.5 10*3/uL (ref 0.7–4.0)
MCH: 28.8 pg (ref 26.0–34.0)
MCHC: 33.4 g/dL (ref 30.0–36.0)
MCV: 86.1 fL (ref 80.0–100.0)
Monocytes Absolute: 0.5 10*3/uL (ref 0.1–1.0)
Monocytes Relative: 7 %
Neutro Abs: 4.6 10*3/uL (ref 1.7–7.7)
Neutrophils Relative %: 66 %
Platelets: 231 10*3/uL (ref 150–400)
RBC: 5.25 MIL/uL (ref 4.22–5.81)
RDW: 12.5 % (ref 11.5–15.5)
WBC: 6.9 10*3/uL (ref 4.0–10.5)
nRBC: 0 % (ref 0.0–0.2)

## 2022-02-16 LAB — COMPREHENSIVE METABOLIC PANEL
ALT: 23 U/L (ref 0–44)
AST: 16 U/L (ref 15–41)
Albumin: 4.4 g/dL (ref 3.5–5.0)
Alkaline Phosphatase: 71 U/L (ref 38–126)
Anion gap: 9 (ref 5–15)
BUN: 20 mg/dL (ref 6–20)
CO2: 25 mmol/L (ref 22–32)
Calcium: 9.6 mg/dL (ref 8.9–10.3)
Chloride: 103 mmol/L (ref 98–111)
Creatinine, Ser: 1.64 mg/dL — ABNORMAL HIGH (ref 0.61–1.24)
GFR, Estimated: 53 mL/min — ABNORMAL LOW (ref >60)
Glucose, Bld: 95 mg/dL (ref 70–99)
Potassium: 4 mmol/L (ref 3.5–5.1)
Sodium: 137 mmol/L (ref 135–145)
Total Bilirubin: 0.6 mg/dL (ref 0.3–1.2)
Total Protein: 7.6 g/dL (ref 6.5–8.1)

## 2022-02-16 LAB — TROPONIN I (HIGH SENSITIVITY)
Troponin I (High Sensitivity): 10 ng/L (ref <18)
Troponin I (High Sensitivity): 11 ng/L (ref <18)

## 2022-02-16 IMAGING — CT CT ANGIO HEAD-NECK (W OR W/O PERF)
1 of 11 series · 5 of 33 positions shown · IV contrast (APPLIED)
Comparison: CT head [DATE].

CLINICAL DATA: Dizziness.

EXAM:
CT ANGIOGRAPHY HEAD AND NECK
TECHNIQUE: Multidetector CT imaging of the head and neck was performed using
the standard protocol during bolus administration of intravenous
contrast. Multiplanar CT image reconstructions and MIPs were
obtained to evaluate the vascular anatomy. Carotid stenosis
measurements (when applicable) are obtained utilizing NASCET
criteria, using the distal internal carotid diameter as the
denominator.

[Series 11: ax thin · axial · 0.61mm/px · z∈[+873,+1108]mm · 5 of 353 slices shown]
[im 59/353  soft-tissue]
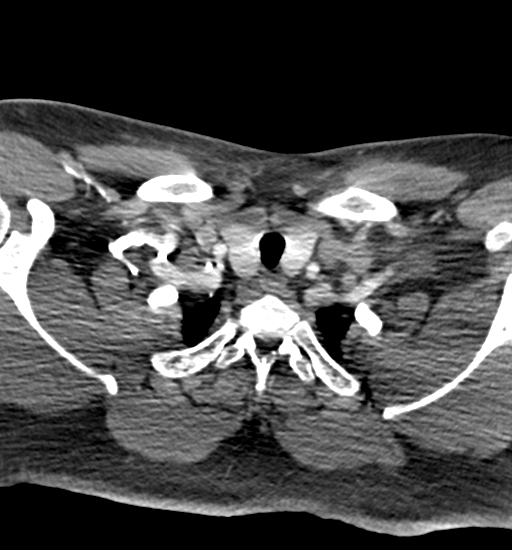
[im 118/353  bone]
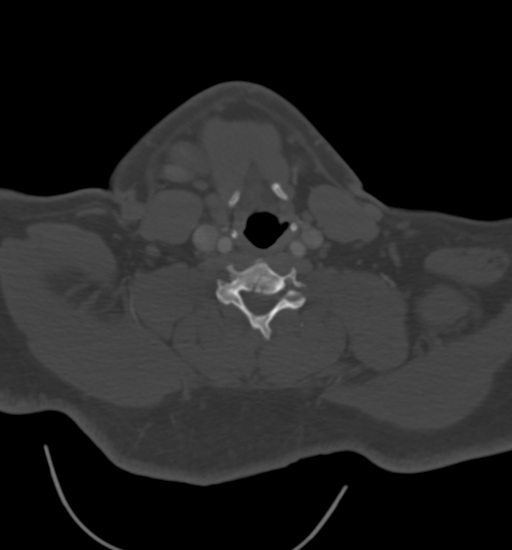
[im 177/353  soft-tissue]
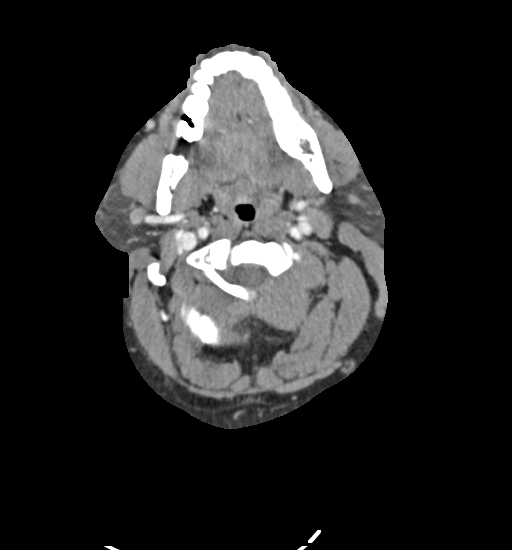
[im 235/353  bone]
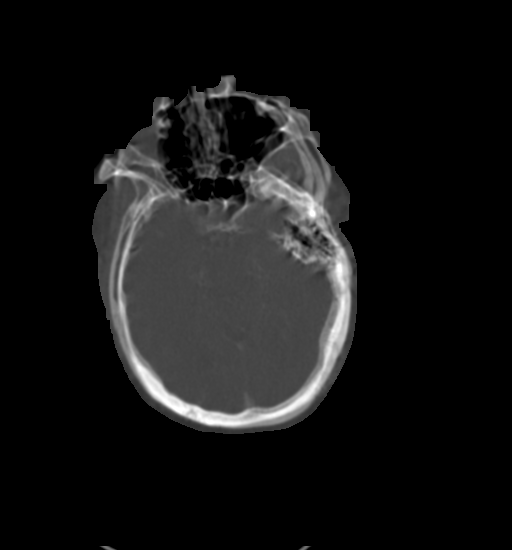
[im 294/353  soft-tissue]
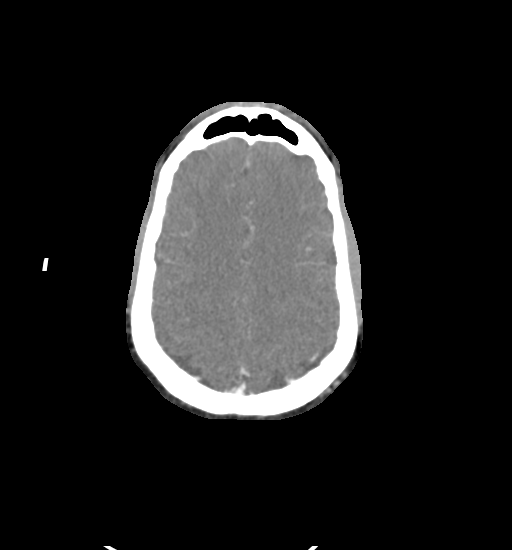

[5 of 33 positions shown; findings below may reference images not displayed]

RADIATION DOSE REDUCTION: This exam was performed according to the
departmental dose-optimization program which includes automated
exposure control, adjustment of the mA and/or kV according to
patient size and/or use of iterative reconstruction technique.

CONTRAST:  75mL OMNIPAQUE IOHEXOL 350 MG/ML SOLN
FINDINGS: CT HEAD FINDINGS

Brain: No evidence of acute large vascular territory infarction,
hemorrhage, hydrocephalus, extra-axial collection or mass
lesion/mass effect. Partially empty sella.

Vascular: Detailed below.

Skull: No acute fracture.

Sinuses: Clear sinuses.

Orbits: No acute finding.

Review of the MIP images confirms the above findings

CTA NECK FINDINGS

Mildly motion limited.

Aortic arch: Great vessel origins are patent without significant
stenosis.

Right carotid system: No evidence of dissection, stenosis (50% or
greater) or occlusion.

Left carotid system: No evidence of dissection, stenosis (50% or
greater) or occlusion.

Vertebral arteries: Codominant. No evidence of dissection, stenosis
(50% or greater) or occlusion.

Skeleton: No acute abnormality.

Other neck: No acute abnormality.

Upper chest: Visualized lung apices are clear.

Review of the MIP images confirms the above findings

CTA HEAD FINDINGS

Nondiagnostic study due to extensive patient motion. Patient became
sick during contrast administration per tech note.
IMPRESSION: CT Head:

1. No evidence of acute intracranial abnormality.
2. Partially empty sella, which is often a normal anatomic variant
but can be associated with idiopathic intracranial hypertension
(pseudotumor cerebri).

CTA head:

Nondiagnostic study due to extensive patient motion. Patient became
sick during contrast administration per tech note.

CTA neck:

No evidence of significant (greater than 50%) stenosis. Mildly
motion limited.

## 2022-02-16 MED ORDER — IOHEXOL 350 MG/ML SOLN
100.0000 mL | Freq: Once | INTRAVENOUS | Status: AC | PRN
  Administered 2022-02-16: 75 mL via INTRAVENOUS

## 2022-02-16 NOTE — ED Provider Notes (Signed)
[place] EMERGENCY DEPT Provider Note   CSN: [id number] Arrival date & time: 02/16/22  1023     History  Chief Complaint  Patient presents with   Hypertension    ISRRAEL Bailey is a 41 y.o. male with Pmhx HTN who presents to the ED today with complaints of continued high blood pressure readings.  Patient reports that he has been compliant with all of his blood pressure medications.  He recently saw his cardiologist Dr. Harriet Masson on 05/12 and was noted to be hypertensive then.  He had his losartan changed to valsartan at that time and had plans to follow-up with the pharmacist in 2 weeks and Dr. Harriet Masson in 12 weeks.  He presented to the ED on 05/15 with complaint of headache and continued high blood pressure.  He had work-up done at that time including a CT head which was negative.  His blood pressure at that time was only in the 379K systolic.  He was discharged home with plans to follow-up with PCP.  He states that he has been taking his blood pressure medication as prescribed however he goes to work and gets his blood pressure checked and it is elevated.  He states that he was not allowed to work with elevated blood pressures.  He states that he has continued to have headache and dizziness.  He states his dizziness was so severe this morning he was unable to get out of bed.  He feels like he is spinning however then reports that he feels like he could pass out at any second.  He states that he has had some blurry vision however denies double vision for the past week.  He denies any other associated symptoms.  Pt currently taking Amlodipine 10 mg once daily, Hydralazine 75 mg BID, Labetalol 200 mg BID, Spironolactone 25 mg once daily, and Valsartan 160 mg once daily.   Per chart review with office visit by Dr. Harriet Masson it does appear that patient has history of medical noncompliance.  Patient ultimately disagrees with this and states that he has been compliant on his medications.   The  history is provided by the patient and medical records.      Home Medications Prior to Admission medications   Medication Sig Start Date End Date Taking? Authorizing Provider  amLODipine (NORVASC) 10 MG tablet Take 1 tablet (10 mg total) by mouth daily. 02/05/22  Yes Tobb, Kardie, DO  hydrALAZINE (APRESOLINE) 50 MG tablet Take 1.5 tablets (75 mg total) by mouth in the morning and at bedtime. 02/05/22  Yes Tobb, Kardie, DO  labetalol (NORMODYNE) 200 MG tablet Take 1 tablet (200 mg total) by mouth 2 (two) times daily. 02/05/22  Yes Tobb, Kardie, DO  Semaglutide-Weight Management 0.25 MG/0.5ML SOAJ Inject 0.25 mg into the skin once a week for 28 days. 01/28/22 02/25/22 Yes Tobb, Kardie, DO  spironolactone (ALDACTONE) 25 MG tablet Take 1 tablet (25 mg total) by mouth daily. 02/05/22  Yes Tobb, Kardie, DO  valsartan (DIOVAN) 160 MG tablet Take 1 tablet (160 mg total) by mouth daily. 02/05/22  Yes Tobb, Kardie, DO  Semaglutide-Weight Management 0.5 MG/0.5ML SOAJ Inject 0.5 mg into the skin once a week for 28 days. 02/26/22 03/26/22  Tobb, Godfrey Pick, DO  Semaglutide-Weight Management 1 MG/0.5ML SOAJ Inject 1 mg into the skin once a week for 28 days. 03/27/22 04/24/22  Tobb, Godfrey Pick, DO  Semaglutide-Weight Management 1.7 MG/0.75ML SOAJ Inject 1.7 mg into the skin once a week for 28 days. 04/25/22 05/23/22  Tobb, Kardie, DO  Semaglutide-Weight Management 2.4 MG/0.75ML SOAJ Inject 2.4 mg into the skin once a week for 28 days. 05/24/22 06/21/22  Tobb, Kardie, DO  LISINOPRIL-HYDROCHLOROTHIAZIDE PO Take by mouth.  07/06/19  [provider]      Allergies    Chlorthalidone and Lisinopril    Review of Systems   Review of Systems  Constitutional:  Negative for chills and fever.  Eyes:  Positive for visual disturbance (blurry vision).  Respiratory:  Negative for shortness of breath.   Cardiovascular:  Negative for chest pain.  Gastrointestinal:  Negative for nausea and vomiting.  Neurological:  Positive for dizziness,  light-headedness and headaches. Negative for syncope, speech difficulty, weakness and numbness.  All other systems reviewed and are negative.  Physical Exam Updated Vital Signs BP (!) 151/88   Pulse 60   Temp 98.1 F (36.7 C)   Resp 16   Ht 6\' 1"  (1.854 m)   Wt (!) 172.4 kg   SpO2 99%   BMI 50.13 kg/m  Physical Exam Vitals and nursing note reviewed.  Constitutional:      Appearance: He is obese. He is not ill-appearing or diaphoretic.  HENT:     Head: Normocephalic and atraumatic.  Eyes:     Extraocular Movements: Extraocular movements intact.     Conjunctiva/sclera: Conjunctivae normal.     Pupils: Pupils are equal, round, and reactive to light.     Comments: Horizontal nystagmus  Cardiovascular:     Rate and Rhythm: Normal rate and regular rhythm.     Pulses: Normal pulses.  Pulmonary:     Effort: Pulmonary effort is normal.     Breath sounds: Normal breath sounds. No wheezing, rhonchi or rales.  Abdominal:     Palpations: Abdomen is soft.     Tenderness: There is no abdominal tenderness.  Musculoskeletal:     Cervical back: Neck supple.  Skin:    General: Skin is warm and dry.  Neurological:     Mental Status: He is alert.     Comments: Alert and oriented to self, place, time and event.   Speech is fluent, clear without dysarthria or dysphasia.   Strength 5/5 in upper/lower extremities   Sensation intact in upper/lower extremities   Normal gait.  Negative Romberg. No pronator drift.  Normal finger-to-nose and feet tapping.  CN I not tested  CN II grossly intact visual fields bilaterally. Did not visualize posterior eye.  CN III, IV, VI PERRLA and EOMs intact bilaterally  CN V Intact sensation to sharp and light touch to the face  CN VII facial movements symmetric  CN VIII not tested  CN IX, X no uvula deviation, symmetric rise of soft palate  CN XI 5/5 SCM and trapezius strength bilaterally  CN XII Midline tongue protrusion, symmetric L/R movements      ED Results / Procedures / Treatments   Labs (all labs ordered are listed, but only abnormal results are displayed) Labs Reviewed  COMPREHENSIVE METABOLIC PANEL - Abnormal; Notable for the following components:      Result Value   Creatinine, Ser 1.64 (*)    GFR, Estimated 53 (*)    All other components within normal limits  CBC WITH DIFFERENTIAL/PLATELET  TROPONIN I (HIGH SENSITIVITY)  TROPONIN I (HIGH SENSITIVITY)    EKG None  Radiology CT ANGIO HEAD NECK W WO CM  Result Date: 02/16/2022 CLINICAL DATA:  Dizziness. EXAM: CT ANGIOGRAPHY HEAD AND NECK TECHNIQUE: Multidetector CT imaging of the head and neck  was performed using the standard protocol during bolus administration of intravenous contrast. Multiplanar CT image reconstructions and MIPs were obtained to evaluate the vascular anatomy. Carotid stenosis measurements (when applicable) are obtained utilizing NASCET criteria, using the distal internal carotid diameter as the denominator. RADIATION DOSE REDUCTION: This exam was performed according to the departmental dose-optimization program which includes automated exposure control, adjustment of the mA and/or kV according to patient size and/or use of iterative reconstruction technique. CONTRAST:  82mL OMNIPAQUE IOHEXOL 350 MG/ML SOLN COMPARISON:  CT head Feb 08, 2021. FINDINGS: CT HEAD FINDINGS Brain: No evidence of acute large vascular territory infarction, hemorrhage, hydrocephalus, extra-axial collection or mass lesion/mass effect. Partially empty sella. Vascular: Detailed below. Skull: No acute fracture. Sinuses: Clear sinuses. Orbits: No acute finding. Review of the MIP images confirms the above findings CTA NECK FINDINGS Mildly motion limited. Aortic arch: Great vessel origins are patent without significant stenosis. Right carotid system: No evidence of dissection, stenosis (50% or greater) or occlusion. Left carotid system: No evidence of dissection, stenosis (50% or greater) or  occlusion. Vertebral arteries: Codominant. No evidence of dissection, stenosis (50% or greater) or occlusion. Skeleton: No acute abnormality. Other neck: No acute abnormality. Upper chest: Visualized lung apices are clear. Review of the MIP images confirms the above findings CTA HEAD FINDINGS Nondiagnostic study due to extensive patient motion. Patient became sick during contrast administration per tech note. IMPRESSION: CT Head: 1. No evidence of acute intracranial abnormality. 2. Partially empty sella, which is often a normal anatomic variant but can be associated with idiopathic intracranial hypertension (pseudotumor cerebri). CTA head: Nondiagnostic study due to extensive patient motion. Patient became sick during contrast administration per tech note. CTA neck: No evidence of significant (greater than 50%) stenosis. Mildly motion limited. Electronically Signed   By: Margaretha Sheffield M.D.   On: 02/16/2022 12:58    Procedures Procedures    Medications Ordered in ED Medications  iohexol (OMNIPAQUE) 350 MG/ML injection 100 mL (75 mLs Intravenous Contrast Given 02/16/22 1235)    ED Course/ Medical Decision Making/ A&P Clinical Course as of 02/16/22 1430  Tue Feb 16, 2022  1159 BP: 131/80 [MV]    Clinical Course User Index [MV] Calvin Maize, PA-C                           Medical Decision Making 41 year old male who presents to the ED today with continued elevated blood pressure readings at home with associated headache, dizziness/lightheadedness, blurry vision.  Seen in the ED on 05/15 with reassuring work-up and blood pressure stable at that time and ultimately discharged home.  Symptoms worsened today prompting return ED visit.  On arrival to the ED today his blood pressure is 157/97.  Remainder vitals are unremarkable.  He reports that his blood pressure has been as high as the 202R to 427C systolic.  Reports compliance with his medications.  He is on multiple blood pressure medications  for same.  On exam he is noted to have horizontal nystagmus.  Remainder of neuro exam is reassuring at this time.  Given complaint of dizziness which does not appear to have been present for last week we will plan for CTA head and neck for further eval as we do not have MRI capability here.  He denies any history of vertigo.  We will plan for lab work at this time including CBC, CMP, troponin as well as EKG and continue to monitor.  Ultimately patient will need  to follow-up with cardiology in the outpatient setting given continued high blood pressure readings.  CBC without leukocytosis. Hgb stable at 15.1 without signs of anemia CMP with slightly worsening creatinine at 1.64 (1.53 during last ED visit 8 days ago). No other electrolyte abnormalities.  Troponin of 10. Will plan to repeat.   CTAs: IMPRESSION:  CT Head:   1. No evidence of acute intracranial abnormality.  2. Partially empty sella, which is often a normal anatomic variant  but can be associated with idiopathic intracranial hypertension  (pseudotumor cerebri).   CTA head:   Nondiagnostic study due to extensive patient motion. Patient became  sick during contrast administration per tech note.   CTA neck:   No evidence of significant (greater than 50%) stenosis. Mildly  motion limited.   Pt reports getting nauseated with IV contrast die. On repeat eval however he reports improvement in his symptoms. He no longer feels dizzy. His blood pressure has been as low as 130's here without intervention. Question stress of his high blood pressure causing falsely elevated reads? Will plan to repeat trop and if stable/flat will discharge home with cards follow up.   Repeat trop stable at 11. Do not feel pt requires additional workup in the ED today. Question possibility of IIH causing elevated blood pressure? Pt to follow up with PCP. He is also provided neuro follow up. Recommend to follow up with his cardiologist for further eval of  continued elevated BP. Pt in agreement with plan and stable for discharge home.   Amount and/or Complexity of Data Reviewed Labs: ordered. Radiology: ordered.  Risk Prescription drug management.           Final Clinical Impression(s) / ED Diagnoses Final diagnoses:  Primary hypertension  Dizziness    Rx / DC Orders ED Discharge Orders     None        Discharge Instructions      Please follow up with your cardiologist regarding ED visit today and continued elevated blood pressure readings. Your readings have been very stable in the ED today with pressures only as high as the 937'J systolic.   Follow up with Neurology regarding headache/dizziness. Your CT scan today showed findings that could be related to increased pressure around the brain however could also be a normal finding for your anatomy.   Return to the ED for any new/worsening symptoms.        Calvin Maize, PA-C 02/16/22 1430    Lajean Saver, MD 02/16/22 [phone]

## 2022-02-16 NOTE — Telephone Encounter (Signed)
Called pt he states he is taking the medications as prescribed but they are not working.  193/101 HR 74 182/102  186/108 He is having symptoms: "loopy", lightheaded "I could hardly get out of the bed this morning". Pt states he just arrived to ED (at [facility]).  Will get message to Dr. Harriet Masson for review. Pt aware Dr. Harriet Masson id out of the office this week.

## 2022-02-16 NOTE — Telephone Encounter (Signed)
  Pt c/o medication issue:  1. Name of Medication: amLODipine (NORVASC) 10 MG tablet hydrALAZINE (APRESOLINE) 50 MG tablet labetalol (NORMODYNE) 200 MG tablet spironolactone (ALDACTONE) 25 MG tablet valsartan (DIOVAN) 160 MG tablet  2. How are you currently taking this medication (dosage and times per day)? As written   3. Are you having a reaction (difficulty breathing--STAT)?   4. What is your medication issue? Pt said, he is taking all 5 medications but his BP is still elevated. He wants to know what he needs to do next and if he can see Dr. Harriet Masson this week

## 2022-02-16 NOTE — ED Notes (Signed)
Patient transported to Imaging at this Time. 

## 2022-02-16 NOTE — ED Notes (Signed)
RN provided AVS using Teachback Method. Patient verbalizes understanding of Discharge Instructions. Opportunity for Questioning and Answers were provided by RN. Patient Discharged from ED ambulatory to Home. ° °

## 2022-02-16 NOTE — Discharge Instructions (Signed)
Please follow up with your cardiologist regarding ED visit today and continued elevated blood pressure readings. Your readings have been very stable in the ED today with pressures only as high as the 945'O systolic.   Follow up with Neurology regarding headache/dizziness. Your CT scan today showed findings that could be related to increased pressure around the brain however could also be a normal finding for your anatomy.   Return to the ED for any new/worsening symptoms.

## 2022-02-16 NOTE — ED Notes (Signed)
Patient returned from Imaging at this Time.

## 2022-02-16 NOTE — ED Triage Notes (Signed)
Pt. States he took BP at home 193/ 108. States he feels bad. States he is having blurry vision, dizziness. Takes spirolactone, amlodipine labetalol.,Losartan this am. But still feels bad. States takes meds as prescribed.

## 2022-02-19 ENCOUNTER — Ambulatory Visit: Payer: 59

## 2022-02-23 ENCOUNTER — Telehealth: Payer: Self-pay | Admitting: Cardiology

## 2022-02-23 NOTE — Telephone Encounter (Signed)
02/23/22  Patient dropped off FMLA forms at [facility] office to be completed. Forms were put in Dr.Tobb box.  **Pt signed release and billing form**  Once forms complete Pt will pay $29 forms fee

## 2022-02-24 ENCOUNTER — Encounter (HOSPITAL_BASED_OUTPATIENT_CLINIC_OR_DEPARTMENT_OTHER): Payer: Self-pay | Admitting: Family Medicine

## 2022-02-26 ENCOUNTER — Encounter (HOSPITAL_BASED_OUTPATIENT_CLINIC_OR_DEPARTMENT_OTHER): Payer: Self-pay | Admitting: Obstetrics and Gynecology

## 2022-02-26 ENCOUNTER — Other Ambulatory Visit: Payer: Self-pay

## 2022-02-26 ENCOUNTER — Observation Stay (HOSPITAL_BASED_OUTPATIENT_CLINIC_OR_DEPARTMENT_OTHER)
Admission: EM | Admit: 2022-02-26 | Discharge: 2022-02-27 | Disposition: A | Discharge status: Home / Self Care | Payer: 59 | Attending: Emergency Medicine | Admitting: Emergency Medicine

## 2022-02-26 ENCOUNTER — Emergency Department (HOSPITAL_BASED_OUTPATIENT_CLINIC_OR_DEPARTMENT_OTHER): Payer: 59

## 2022-02-26 DIAGNOSIS — G4733 Obstructive sleep apnea (adult) (pediatric): Secondary | ICD-10-CM | POA: Diagnosis present

## 2022-02-26 DIAGNOSIS — I130 Hypertensive heart and chronic kidney disease with heart failure and stage 1 through stage 4 chronic kidney disease, or unspecified chronic kidney disease: Secondary | ICD-10-CM | POA: Insufficient documentation

## 2022-02-26 DIAGNOSIS — F17210 Nicotine dependence, cigarettes, uncomplicated: Secondary | ICD-10-CM | POA: Diagnosis not present

## 2022-02-26 DIAGNOSIS — N1831 Chronic kidney disease, stage 3a: Secondary | ICD-10-CM | POA: Diagnosis not present

## 2022-02-26 DIAGNOSIS — I10 Essential (primary) hypertension: Secondary | ICD-10-CM | POA: Diagnosis present

## 2022-02-26 DIAGNOSIS — Z79899 Other long term (current) drug therapy: Secondary | ICD-10-CM | POA: Insufficient documentation

## 2022-02-26 DIAGNOSIS — Z7985 Long-term (current) use of injectable non-insulin antidiabetic drugs: Secondary | ICD-10-CM | POA: Insufficient documentation

## 2022-02-26 DIAGNOSIS — Q2112 Patent foramen ovale: Secondary | ICD-10-CM | POA: Diagnosis not present

## 2022-02-26 DIAGNOSIS — I5032 Chronic diastolic (congestive) heart failure: Secondary | ICD-10-CM | POA: Diagnosis present

## 2022-02-26 DIAGNOSIS — R4701 Aphasia: Secondary | ICD-10-CM | POA: Diagnosis present

## 2022-02-26 DIAGNOSIS — R519 Headache, unspecified: Principal | ICD-10-CM | POA: Diagnosis present

## 2022-02-26 DIAGNOSIS — R2990 Unspecified symptoms and signs involving the nervous system: Secondary | ICD-10-CM

## 2022-02-26 DIAGNOSIS — G932 Benign intracranial hypertension: Secondary | ICD-10-CM

## 2022-02-26 DIAGNOSIS — K219 Gastro-esophageal reflux disease without esophagitis: Secondary | ICD-10-CM | POA: Diagnosis present

## 2022-02-26 HISTORY — DX: Patent foramen ovale: Q21.12

## 2022-02-26 LAB — COMPREHENSIVE METABOLIC PANEL
ALT: 26 U/L (ref 0–44)
AST: 21 U/L (ref 15–41)
Albumin: 4.5 g/dL (ref 3.5–5.0)
Alkaline Phosphatase: 59 U/L (ref 38–126)
Anion gap: 8 (ref 5–15)
BUN: 27 mg/dL — ABNORMAL HIGH (ref 6–20)
CO2: 24 mmol/L (ref 22–32)
Calcium: 9.2 mg/dL (ref 8.9–10.3)
Chloride: 105 mmol/L (ref 98–111)
Creatinine, Ser: 1.79 mg/dL — ABNORMAL HIGH (ref 0.61–1.24)
GFR, Estimated: 48 mL/min — ABNORMAL LOW (ref >60)
Glucose, Bld: 104 mg/dL — ABNORMAL HIGH (ref 70–99)
Potassium: 4.1 mmol/L (ref 3.5–5.1)
Sodium: 137 mmol/L (ref 135–145)
Total Bilirubin: 0.4 mg/dL (ref 0.3–1.2)
Total Protein: 7.6 g/dL (ref 6.5–8.1)

## 2022-02-26 LAB — PROTIME-INR
INR: 1 (ref 0.8–1.2)
Prothrombin Time: 13.3 seconds (ref 11.4–15.2)

## 2022-02-26 LAB — DIFFERENTIAL
Abs Immature Granulocytes: 0.03 10*3/uL (ref 0.00–0.07)
Basophils Absolute: 0 10*3/uL (ref 0.0–0.1)
Basophils Relative: 0 %
Eosinophils Absolute: 0.3 10*3/uL (ref 0.0–0.5)
Eosinophils Relative: 4 %
Immature Granulocytes: 0 %
Lymphocytes Relative: 23 %
Lymphs Abs: 2 10*3/uL (ref 0.7–4.0)
Monocytes Absolute: 0.6 10*3/uL (ref 0.1–1.0)
Monocytes Relative: 8 %
Neutro Abs: 5.6 10*3/uL (ref 1.7–7.7)
Neutrophils Relative %: 65 %

## 2022-02-26 LAB — URINALYSIS, ROUTINE W REFLEX MICROSCOPIC
Bilirubin Urine: NEGATIVE
Glucose, UA: NEGATIVE mg/dL
Hgb urine dipstick: NEGATIVE
Ketones, ur: NEGATIVE mg/dL
Leukocytes,Ua: NEGATIVE
Nitrite: NEGATIVE
Protein, ur: NEGATIVE mg/dL
Specific Gravity, Urine: 1.015 (ref 1.005–1.030)
pH: 5.5 (ref 5.0–8.0)

## 2022-02-26 LAB — CBC
HCT: 42.9 % (ref 39.0–52.0)
Hemoglobin: 14.4 g/dL (ref 13.0–17.0)
MCH: 28.9 pg (ref 26.0–34.0)
MCHC: 33.6 g/dL (ref 30.0–36.0)
MCV: 86 fL (ref 80.0–100.0)
Platelets: 249 10*3/uL (ref 150–400)
RBC: 4.99 MIL/uL (ref 4.22–5.81)
RDW: 12.5 % (ref 11.5–15.5)
WBC: 8.6 10*3/uL (ref 4.0–10.5)
nRBC: 0 % (ref 0.0–0.2)

## 2022-02-26 LAB — ETHANOL: Alcohol, Ethyl (B): <10 mg/dL (ref <10)

## 2022-02-26 LAB — RAPID URINE DRUG SCREEN, HOSP PERFORMED
Amphetamines: NOT DETECTED
Barbiturates: NOT DETECTED
Benzodiazepines: NOT DETECTED
Cocaine: NOT DETECTED
Opiates: NOT DETECTED
Tetrahydrocannabinol: NOT DETECTED

## 2022-02-26 LAB — CBG MONITORING, ED: Glucose-Capillary: 101 mg/dL — ABNORMAL HIGH (ref 70–99)

## 2022-02-26 LAB — APTT: aPTT: 35 seconds (ref 24–36)

## 2022-02-26 IMAGING — CT CT HEAD CODE STROKE
3 of 4 series · 15 of 47 positions shown, 18 images · non-contrast
Comparison: CT head [DATE]

CLINICAL DATA: Code stroke. Headache, speech difficulties, left leg
numbness



[Series 3: head wo · axial · 0.50mm/px · z∈[+1079,+1239]mm · 9 of 38 slices shown, 12 images]
[im 3/38  brain]
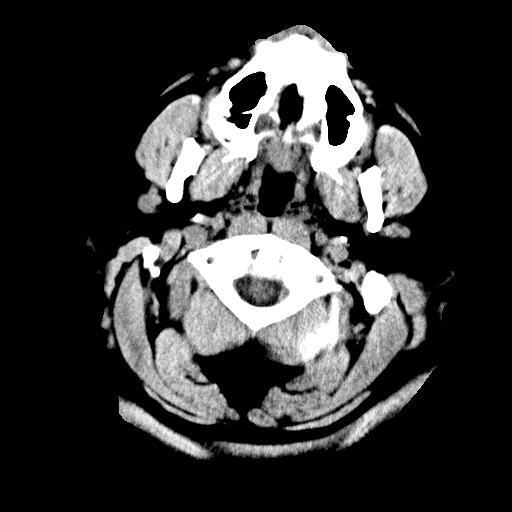
[im 3/38  bone]
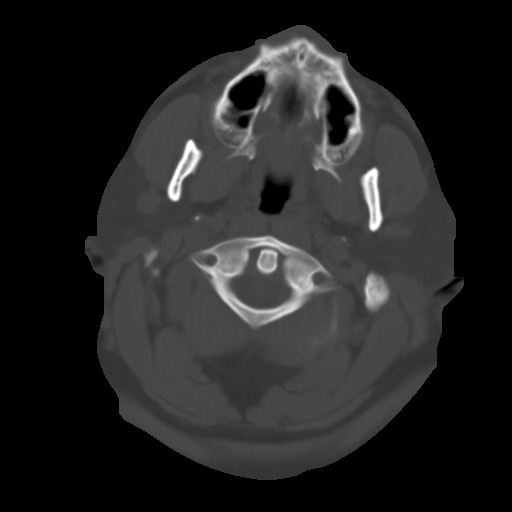
[im 8/38  brain]
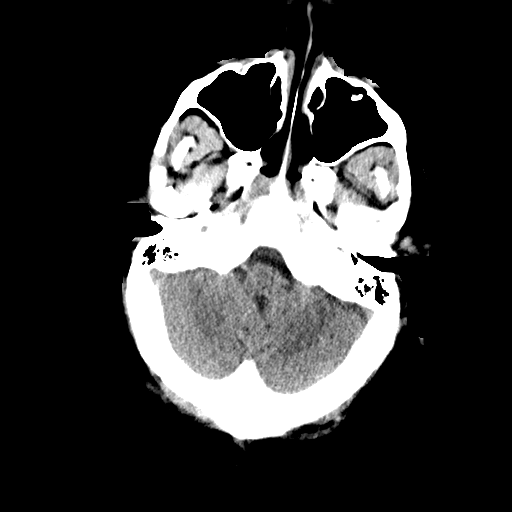
[im 11/38  brain]
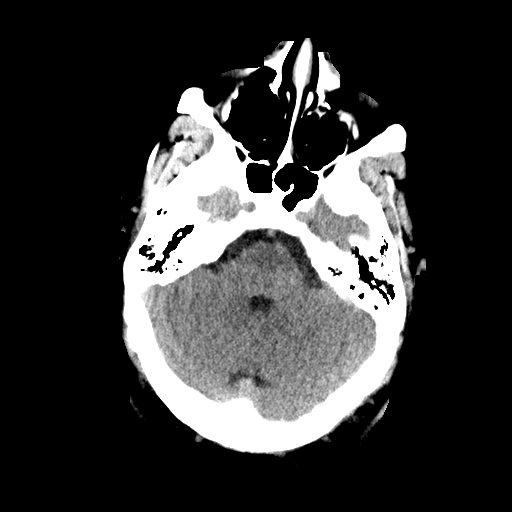
[im 16/38  brain]
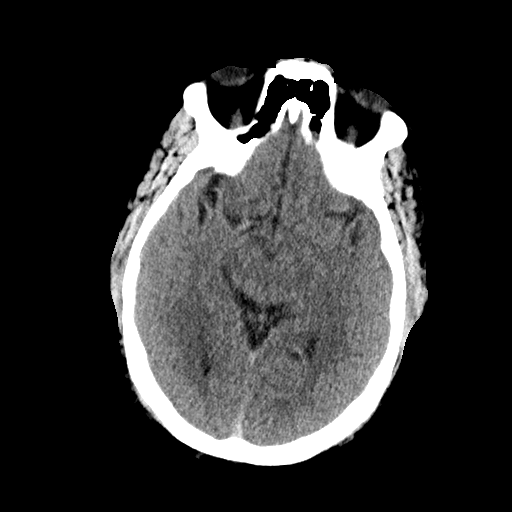
[im 19/38  brain]
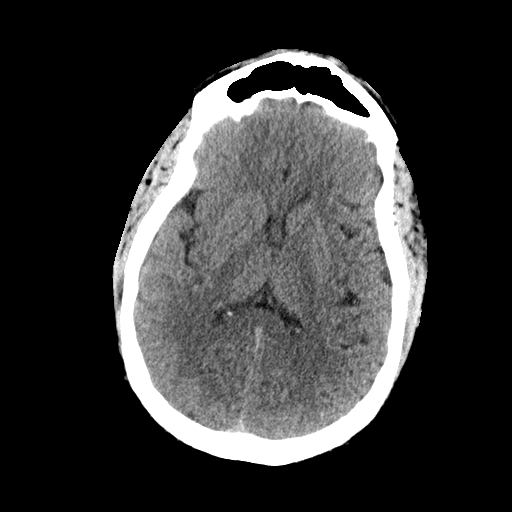
[im 19/38  bone]
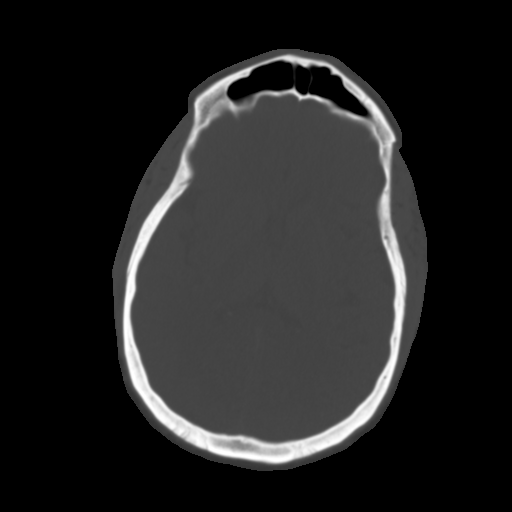
[im 22/38  brain]
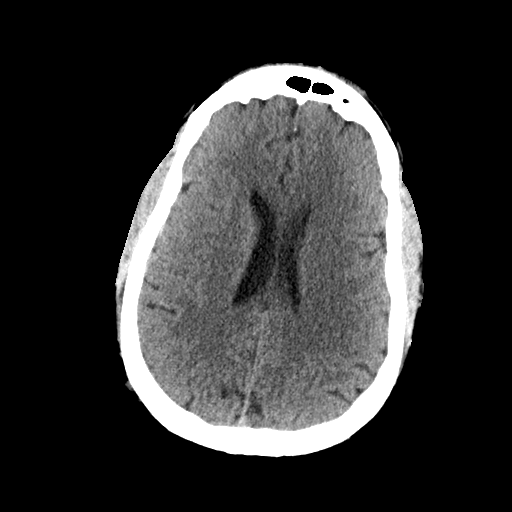
[im 27/38  brain]
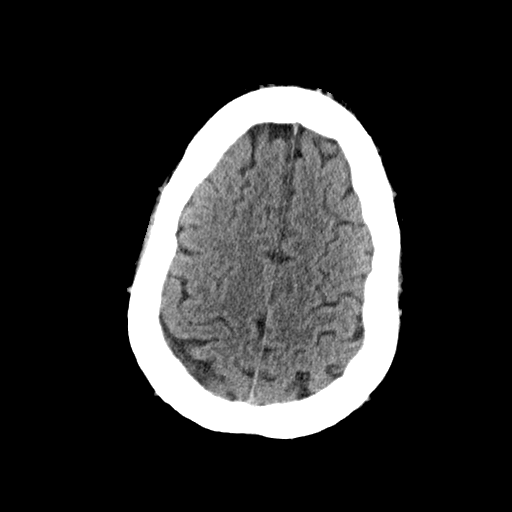
[im 30/38  brain]
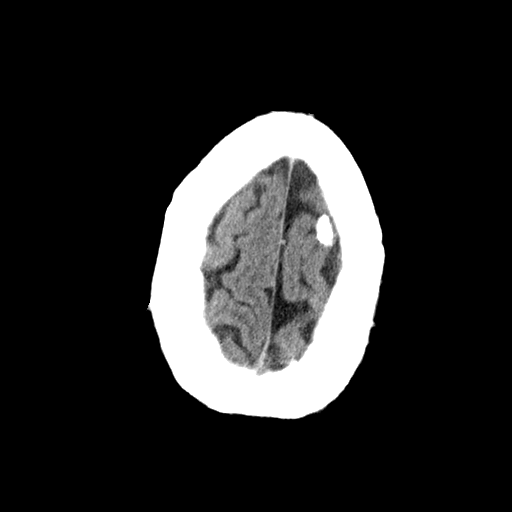
[im 35/38  brain]
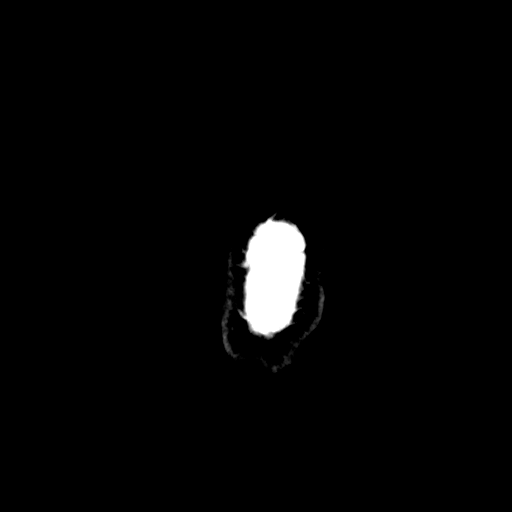
[im 35/38  bone]
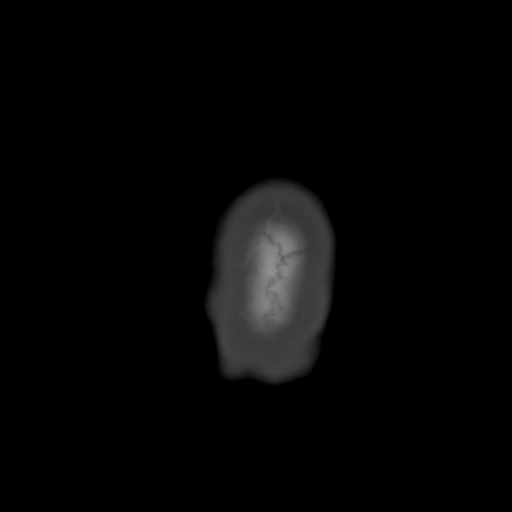

[Series 5: coronal soft · coronal · 0.41mm/px · 3 of 79 slices shown]
[im 27/79  brain]
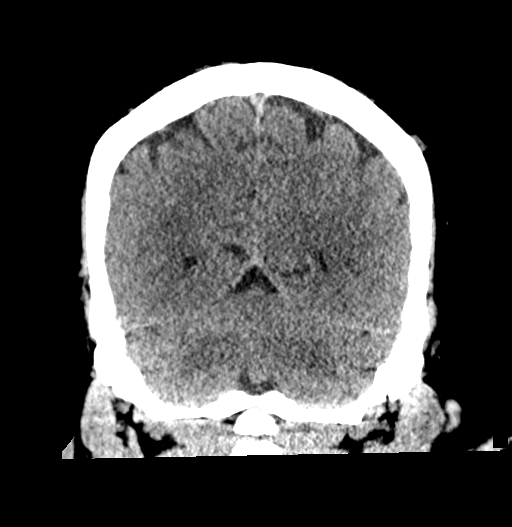
[im 35/79  brain]
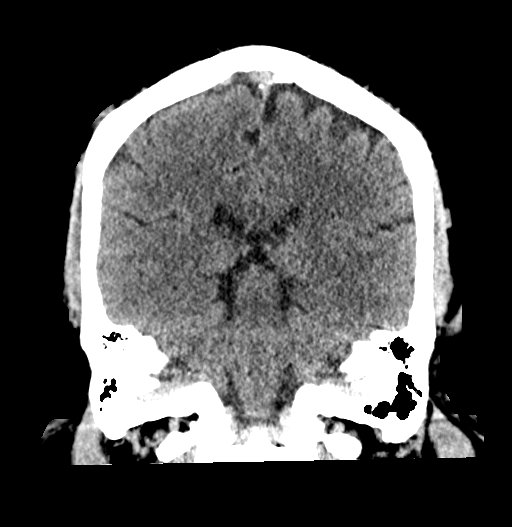
[im 44/79  brain]
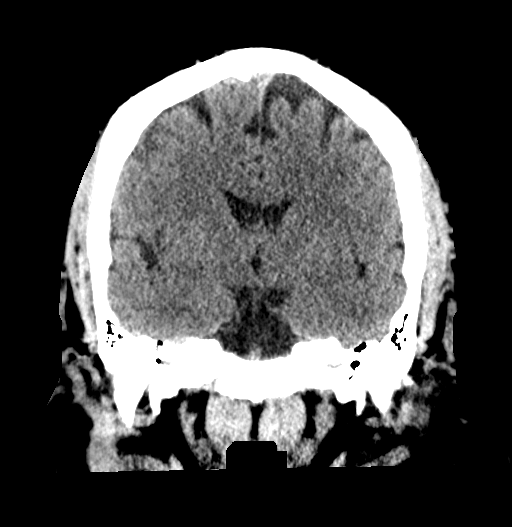

[Series 6: sagittal soft · sagittal · 0.44mm/px · 3 of 62 slices shown]
[im 21/62  brain]
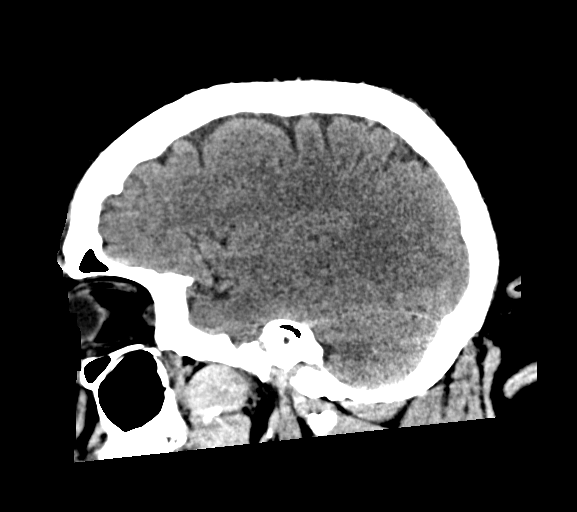
[im 31/62  brain]
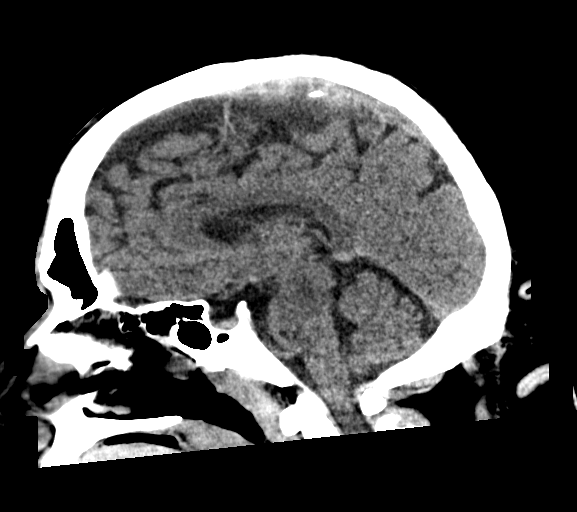
[im 41/62  brain]
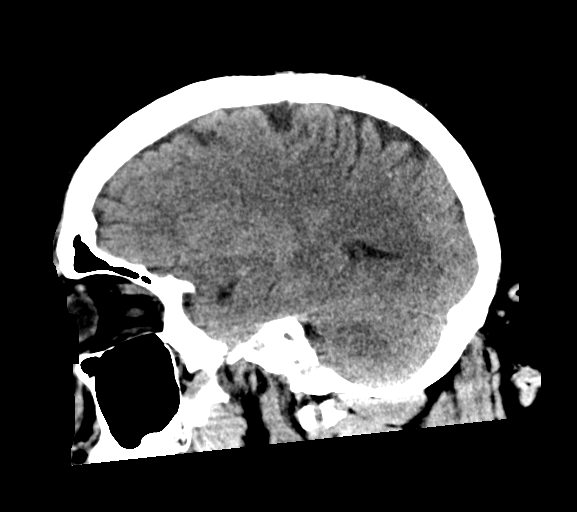

[15 of 47 positions shown; findings below may reference images not displayed]

FINDINGS: Brain: There is no acute intracranial hemorrhage, extra-axial fluid
collection, or acute infarct.

Parenchymal volume is normal. The ventricles are normal in size.
Gray-white differentiation is preserved

A calcified lesion overlying the left frontal lobe measuring up to 9
mm is unchanged, likely a calcified meningioma. There is no other
mass lesion. There is no mass effect or midline shift.

Vascular: No hyperdense vessel or unexpected calcification.

Skull: Normal. Negative for fracture or focal lesion.

Sinuses/Orbits: The imaged paranasal sinuses are clear. The globes
and orbits are unremarkable.

Other: None.

ASPECTS (Alberta Stroke Program Early CT Score)

- Ganglionic level infarction (caudate, lentiform nuclei, internal
capsule, insula, M1-M3 cortex): 7

- Supraganglionic infarction (M4-M6 cortex): 3

Total score (0-10 with 10 being normal): 10
IMPRESSION: 1. No acute intracranial pathology.
2. ASPECTS is 10

These results were called by telephone at the time of interpretation
on [DATE] at [DATE] to provider MEHL , who verbally
acknowledged these results.

## 2022-02-26 MED ORDER — ASPIRIN 81 MG PO CHEW
324.0000 mg | CHEWABLE_TABLET | Freq: Once | ORAL | Status: AC
  Administered 2022-02-26: 324 mg via ORAL
  Filled 2022-02-26: qty 4

## 2022-02-26 MED ORDER — SODIUM CHLORIDE 0.9% FLUSH
3.0000 mL | Freq: Once | INTRAVENOUS | Status: AC
  Administered 2022-02-26: 3 mL via INTRAVENOUS
  Filled 2022-02-26: qty 3

## 2022-02-26 NOTE — Progress Notes (Signed)
[facility]  41 yo with hx HTN, smoking who presents to ED today for headache, blurry vision, left leg numbness.  Also word finding issues.  Previously seen in ED on 5/15 for HTN, headache as well as 5/23 for hypertension, HA, dizziness, LH and blurry vision.  CT 5/15 without acute abnormalities.  5/23 CTA without acute intracranial abnormality, but partially empty sella (can be associated with pseudotumor).  Head ct today without acute abnormality.  EDP discussed with neurology who recommended transfer for completion of neurology w/u for stroke with MRI as well as consider r/o pseudotumor/formal neurology w/u.  Vitals notable for mild bradycardia, intermittent HTN.  Labs notable for AKI.  Place in obs tele at [facility].

## 2022-02-26 NOTE — ED Triage Notes (Signed)
Patients reports to the ER for having trouble talking. Patient reports he feels like his head is hurting and he cannot find his words. Patient reports this has been going on x1 hour. Patient reports he has had two cat scans at this facility

## 2022-02-26 NOTE — Consult Note (Signed)
[facility] TeleNeurology Consult Services    Date of Service:  02/26/2022     Metrics: Last Known Well: 2:30 PM Symptoms: As per HPI.  Patient is not a candidate for thrombolytic.   Location of the provider: [facility]  Location of the patient: [facility] Pre-Morbid Modified Rankin Scale: 0 Time Code Stroke Page received:  3:54 PM Time neurologist arrived:  4:07 PM Time NIHSS completed: 4:23 PM    This consult was provided via telemedicine with 2-way video and audio communication. The patient/family was informed that care would be provided in this way and agreed to receive care in this manner.   ED Physician notified of diagnostic impression and management plan at: 4:28 PM   Assessment: 41 year old male presenting with a 3 week history of intermittent occipital headaches accompanied by transient expressive aphasia. His episode today was the worst so far, being associated with left sided numbness, hand tremors, altered sensorium and a 1 hour period of "feeling off" with confusion and sensation of tiredness after his speech deficit resolved. He has been seen in the ED twice previously this month for similar symptoms, with unremarkable imaging studies.  - Exam is nonfocal with NIHSS of 0 - CT head: No acute intracranial pathology. ASPECTS is 10 - CTA of head and neck performed 5/23: Non-diagnostic study due to extensive patient motion. Patient became sick during contrast administration per tech note. CTA neck: No evidence of significant (greater than 50%) stenosis. Mildly motion limited. - Recent hemoglobin A1c from January was normal at 5.4 - Elevated BUN and Cr suggestive of AKI, possibly on underlying CKD (no diagnosis of CKD in his PMHx) - Urine toxicology and EtOH negative.  - DDx includes pseudotumor cerebri, PRES, TIAs, complex partial seizures and complicated migraine   Recommendations: - MRI brain. Based on his body habitus, it appears  that he has just enough shoulder clearance to fit in the MRI far enough to perform brain imaging. Patient was given instructions to cooperate with MRI techs to the best of his ability and to lie perfectly still for the duration of the exam. Patient expressed understanding and agreement with the plan.  - TTE - Cardiac telemetry - Hemoglobin A1c - Fasting lipid panel.   - EEG - Will need to be transferred to [facility] for the above testing      ------------------------------------------------------------------------------   History of Present Illness: 41 year old male with a PMHx of supermorbid obesity, HTN, obesity hypoventilation syndrome, OSA, GERD and dizziness, who presents to the [facility] ED with a 3 week history of recurrent neurological symptoms consisting of episodic posterior/occipital non-throbbing headaches with radiation to the neck in conjunction with transient expressive aphasia, left upper and lower extremity numbness, bilateral hand tremor, confusion and altered sensorium (described as "feels like I am outside my body"). The spells have all resolved within 10 minutes, including the episode he experienced today. What made him worry about today's episode was that the speech deficit was more intense and following return of speech, he felt very worn out and confused for about an hour, which was a new symptom. He states that with today's spell, his posterior headache hit a maximum pain level of 8/10 and since then has gradually improved to 5/10 at the time of Teleneurology evaluation. He has noticed that his headaches over the past 3 weeks have worsened in pian level with increases in his BP and conversely have decreased in intensity when his BP is lower. He is  a smoker and has noticed that immediately upon inhaling tobacco smoke, his headache "becomes 10 times worse". He does have a prior history of headaches that he states are not migraines, which have not previously worsened while smoking.   Today's  spell began at 2:30 PM with the first noticeable symptom being that he "couldn't find my words or speak", following which "I felt off" for one hour. BP was 168/87.   He also endorses experiencing pulsatile tinnitus "once in a while" and has also experienced bilateral blurring of his vision when bending over.    Code Stroke was called in the [facility] ED.      Past Medical History: Past Medical History:  Diagnosis Date   Dizziness 09/25/2019   GERD (gastroesophageal reflux disease)    HTN (hypertension)    Obesity hypoventilation syndrome (HCC) 04/08/2014   OSA (obstructive sleep apnea) 04/08/2014   Sleep apnea       Past Surgical History: Past Surgical History:  Procedure Laterality Date   two knee surgeries Left 1995     Medications:  No current facility-administered medications on file prior to encounter.   Current Outpatient Medications on File Prior to Encounter  Medication Sig Dispense Refill   amLODipine (NORVASC) 10 MG tablet Take 1 tablet (10 mg total) by mouth daily. 90 tablet 3   hydrALAZINE (APRESOLINE) 50 MG tablet Take 1.5 tablets (75 mg total) by mouth in the morning and at bedtime. 270 tablet 3   labetalol (NORMODYNE) 200 MG tablet Take 1 tablet (200 mg total) by mouth 2 (two) times daily. 180 tablet 3   Semaglutide-Weight Management 0.5 MG/0.5ML SOAJ Inject 0.5 mg into the skin once a week for 28 days. 2 mL 0   [START ON 03/27/2022] Semaglutide-Weight Management 1 MG/0.5ML SOAJ Inject 1 mg into the skin once a week for 28 days. 2 mL 0   [START ON 04/25/2022] Semaglutide-Weight Management 1.7 MG/0.75ML SOAJ Inject 1.7 mg into the skin once a week for 28 days. 3 mL 0   [START ON 05/24/2022] Semaglutide-Weight Management 2.4 MG/0.75ML SOAJ Inject 2.4 mg into the skin once a week for 28 days. 3 mL 0   spironolactone (ALDACTONE) 25 MG tablet Take 1 tablet (25 mg total) by mouth daily. 90 tablet 3   valsartan (DIOVAN) 160 MG tablet Take 1 tablet (160 mg total) by mouth daily. 90  tablet 3   [DISCONTINUED] LISINOPRIL-HYDROCHLOROTHIAZIDE PO Take by mouth.         Social History: No drug use Positive for 1 ppd smoking habit   Family History:  Reviewed in Epic   ROS: As per HPI    Anticoagulant use:  No   Antiplatelet use: No   Examination:   BP (!) 147/76   Pulse (!) 59   Temp 98.7 F (37.1 C)   Resp 18   Ht 6\' 1"  (1.854 m)   Wt (!) 172.4 kg   SpO2 98%   BMI 50.13 kg/m     1A: Level of Consciousness - 0 1B: Ask Month and Age - 0 1C: Blink Eyes & Squeeze Hands - 0 2: Test Horizontal Extraocular Movements - 0 3: Test Visual Fields - 0 4: Test Facial Palsy (Use Grimace if Obtunded) - 0 5A: Test Left Arm Motor Drift - 0 5B: Test Right Arm Motor Drift - 0 6A: Test Left Leg Motor Drift - 0 6B: Test Right Leg Motor Drift - 0 7: Test Limb Ataxia (FNF/Heel-Shin) - 0 8: Test Sensation -  0 9: Test Language/Aphasia - 0 10: Test Dysarthria - 0 11: Test Extinction/Inattention - 0   NIHSS Score: 0     Patient/Family was informed the Neurology Consult would occur via TeleHealth consult by way of interactive audio and video telecommunications and consented to receiving care in this manner.   Patient is being evaluated for possible acute neurologic impairment and high pretest probability of imminent or life-threatening deterioration. I spent total of 40 minutes providing care to this patient, including time for face to face visit via telemedicine, review of medical records, imaging studies and discussion of findings with providers, the patient and/or family.   Electronically signed: Dr. Kerney Elbe

## 2022-02-26 NOTE — ED Provider Notes (Signed)
[facility] EMERGENCY DEPT Provider Note   CSN: [id number] Arrival date & time: 02/26/22  1459     History {Add pertinent medical, surgical, social history, OB history to HPI:1} Chief Complaint  Patient presents with   Aphasia    Calvin Bailey is a 41 y.o. male.  He has a history of hypertension LVH smoker.  He has been troubled with pressure headache going on occipital to frontal for a month.  Today woke up with some numbness in his left leg and headache.  About 1 hour ago experienced blurry vision and difficulty finding his words.  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  He said this is happened before but never so long.  He was seen twice last month for elevated blood pressure headache.  Has had a recent head CT and CT angio head and neck.  The history is provided by the patient.  Cerebrovascular Accident This is a new problem. The current episode started 1 to 2 hours ago. The problem occurs constantly. The problem has not changed since onset.Associated symptoms include headaches. Pertinent negatives include no chest pain, no abdominal pain and no shortness of breath. Exacerbated by: Speaking. Nothing relieves the symptoms. He has tried nothing for the symptoms. The treatment provided no relief.      Home Medications Prior to Admission medications   Medication Sig Start Date End Date Taking? Authorizing Provider  amLODipine (NORVASC) 10 MG tablet Take 1 tablet (10 mg total) by mouth daily. 02/05/22   Tobb, Kardie, DO  hydrALAZINE (APRESOLINE) 50 MG tablet Take 1.5 tablets (75 mg total) by mouth in the morning and at bedtime. 02/05/22   Tobb, Kardie, DO  labetalol (NORMODYNE) 200 MG tablet Take 1 tablet (200 mg total) by mouth 2 (two) times daily. 02/05/22   Tobb, Godfrey Pick, DO  Semaglutide-Weight Management 0.5 MG/0.5ML SOAJ Inject 0.5 mg into the skin once a week for 28 days. 02/26/22 03/26/22  Tobb, Godfrey Pick, DO   Semaglutide-Weight Management 1 MG/0.5ML SOAJ Inject 1 mg into the skin once a week for 28 days. 03/27/22 04/24/22  Tobb, Godfrey Pick, DO  Semaglutide-Weight Management 1.7 MG/0.75ML SOAJ Inject 1.7 mg into the skin once a week for 28 days. 04/25/22 05/23/22  Tobb, Godfrey Pick, DO  Semaglutide-Weight Management 2.4 MG/0.75ML SOAJ Inject 2.4 mg into the skin once a week for 28 days. 05/24/22 06/21/22  Tobb, Godfrey Pick, DO  spironolactone (ALDACTONE) 25 MG tablet Take 1 tablet (25 mg total) by mouth daily. 02/05/22   Tobb, Kardie, DO  valsartan (DIOVAN) 160 MG tablet Take 1 tablet (160 mg total) by mouth daily. 02/05/22   Tobb, Kardie, DO  LISINOPRIL-HYDROCHLOROTHIAZIDE PO Take by mouth.  07/06/19  [provider]      Allergies    Chlorthalidone and Lisinopril    Review of Systems   Review of Systems  Constitutional:  Negative for fever.  HENT:  Negative for sore throat.   Eyes:  Positive for visual disturbance.  Respiratory:  Negative for shortness of breath.   Cardiovascular:  Negative for chest pain.  Gastrointestinal:  Negative for abdominal pain.  Genitourinary:  Negative for dysuria.  Musculoskeletal:  Negative for neck pain.  Skin:  Negative for rash.  Neurological:  Positive for speech difficulty, numbness and headaches. Negative for light-headedness.   Physical Exam Updated Vital Signs BP (!) 168/87 (BP Location: Left Arm)   Pulse 65   Temp 98.7 F (37.1 C)  Resp 14   Ht 6\' 1"  (1.854 m)   Wt (!) 172.4 kg   SpO2 99%   BMI 50.13 kg/m  Physical Exam Vitals and nursing note reviewed.  Constitutional:      General: He is not in acute distress.    Appearance: Normal appearance. He is well-developed.  HENT:     Head: Normocephalic and atraumatic.  Eyes:     Conjunctiva/sclera: Conjunctivae normal.  Cardiovascular:     Rate and Rhythm: Normal rate and regular rhythm.     Heart sounds: No murmur heard. Pulmonary:     Effort: Pulmonary effort is normal. No respiratory distress.      Breath sounds: Normal breath sounds.  Abdominal:     Palpations: Abdomen is soft.     Tenderness: There is no abdominal tenderness.  Musculoskeletal:        General: No swelling.     Cervical back: Neck supple.  Skin:    General: Skin is warm and dry.     Capillary Refill: Capillary refill takes less than 2 seconds.  Neurological:     General: No focal deficit present.     Mental Status: He is alert and oriented to person, place, and time.     Cranial Nerves: No cranial nerve deficit.     Sensory: No sensory deficit.     Motor: No weakness.    ED Results / Procedures / Treatments   Labs (all labs ordered are listed, but only abnormal results are displayed) Labs Reviewed  CBG MONITORING, ED - Abnormal; Notable for the following components:      Result Value   Glucose-Capillary 101 (*)    All other components within normal limits  CBC  DIFFERENTIAL  PROTIME-INR  APTT  COMPREHENSIVE METABOLIC PANEL    EKG None  Radiology No results found.  Procedures Procedures  {Document cardiac monitor, telemetry assessment procedure when appropriate:1}  Medications Ordered in ED Medications  sodium chloride flush (NS) 0.9 % injection 3 mL (3 mLs Intravenous Given 02/26/22 1528)    ED Course/ Medical Decision Making/ A&P                           Medical Decision Making Amount and/or Complexity of Data Reviewed Labs: ordered. Radiology: ordered.  This patient complains of ***; this involves an extensive number of treatment Options and is a complaint that carries with it a high risk of complications and morbidity. The differential includes ***  I ordered, reviewed and interpreted labs, which included *** I ordered medication *** and reviewed PMP when indicated. I ordered imaging studies which included *** and I independently    visualized and interpreted imaging which showed *** Additional history obtained from *** Previous records obtained and reviewed *** I consulted  *** and discussed lab and imaging findings and discussed disposition.  Cardiac monitoring reviewed, *** Social determinants considered, *** Critical Interventions: ***  After the interventions stated above, I reevaluated the patient and found *** Admission and further testing considered, ***    {Document critical care time when appropriate:1} {Document review of labs and clinical decision tools ie heart score, Chads2Vasc2 etc:1}  {Document your independent review of radiology images, and any outside records:1} {Document your discussion with family members, caretakers, and with consultants:1} {Document social determinants of health affecting pt's care:1} {Document your decision making why or why not admission, treatments were needed:1} Final Clinical Impression(s) / ED Diagnoses Final diagnoses:  None  Rx / DC Orders ED Discharge Orders     None

## 2022-02-26 NOTE — ED Notes (Signed)
[facility] at bedside for transport. 

## 2022-02-26 NOTE — Consult Note (Signed)
Code stroke activated at 1522.  Left for CT at 1546 and returned at 1554.  Dr Cheral Marker on camera at 1534 for neuro exam. NCCT results given at this time with report.

## 2022-02-26 NOTE — ED Notes (Signed)
Called [facility] to transport patient to [facility] 3W room# 7

## 2022-02-26 NOTE — ED Notes (Signed)
Patient transported to CT 

## 2022-02-27 ENCOUNTER — Observation Stay (HOSPITAL_COMMUNITY): Payer: 59

## 2022-02-27 ENCOUNTER — Observation Stay (HOSPITAL_BASED_OUTPATIENT_CLINIC_OR_DEPARTMENT_OTHER): Payer: 59

## 2022-02-27 ENCOUNTER — Encounter (HOSPITAL_COMMUNITY): Payer: Self-pay | Admitting: Family Medicine

## 2022-02-27 DIAGNOSIS — I5032 Chronic diastolic (congestive) heart failure: Secondary | ICD-10-CM | POA: Diagnosis not present

## 2022-02-27 DIAGNOSIS — G4733 Obstructive sleep apnea (adult) (pediatric): Secondary | ICD-10-CM

## 2022-02-27 DIAGNOSIS — I10 Essential (primary) hypertension: Secondary | ICD-10-CM | POA: Diagnosis not present

## 2022-02-27 DIAGNOSIS — K219 Gastro-esophageal reflux disease without esophagitis: Secondary | ICD-10-CM

## 2022-02-27 DIAGNOSIS — Q2112 Patent foramen ovale: Secondary | ICD-10-CM | POA: Diagnosis not present

## 2022-02-27 DIAGNOSIS — N1831 Chronic kidney disease, stage 3a: Secondary | ICD-10-CM

## 2022-02-27 DIAGNOSIS — Z7985 Long-term (current) use of injectable non-insulin antidiabetic drugs: Secondary | ICD-10-CM | POA: Diagnosis not present

## 2022-02-27 DIAGNOSIS — F17210 Nicotine dependence, cigarettes, uncomplicated: Secondary | ICD-10-CM | POA: Diagnosis not present

## 2022-02-27 DIAGNOSIS — I130 Hypertensive heart and chronic kidney disease with heart failure and stage 1 through stage 4 chronic kidney disease, or unspecified chronic kidney disease: Secondary | ICD-10-CM | POA: Diagnosis not present

## 2022-02-27 DIAGNOSIS — R519 Headache, unspecified: Principal | ICD-10-CM

## 2022-02-27 DIAGNOSIS — R4701 Aphasia: Secondary | ICD-10-CM

## 2022-02-27 DIAGNOSIS — Z79899 Other long term (current) drug therapy: Secondary | ICD-10-CM | POA: Diagnosis not present

## 2022-02-27 LAB — COMPREHENSIVE METABOLIC PANEL
ALT: 27 U/L (ref 0–44)
AST: 21 U/L (ref 15–41)
Albumin: 3.9 g/dL (ref 3.5–5.0)
Alkaline Phosphatase: 59 U/L (ref 38–126)
Anion gap: 7 (ref 5–15)
BUN: 21 mg/dL — ABNORMAL HIGH (ref 6–20)
CO2: 23 mmol/L (ref 22–32)
Calcium: 8.8 mg/dL — ABNORMAL LOW (ref 8.9–10.3)
Chloride: 108 mmol/L (ref 98–111)
Creatinine, Ser: 1.72 mg/dL — ABNORMAL HIGH (ref 0.61–1.24)
GFR, Estimated: 50 mL/min — ABNORMAL LOW (ref >60)
Glucose, Bld: 131 mg/dL — ABNORMAL HIGH (ref 70–99)
Potassium: 4.2 mmol/L (ref 3.5–5.1)
Sodium: 138 mmol/L (ref 135–145)
Total Bilirubin: 0.8 mg/dL (ref 0.3–1.2)
Total Protein: 6.6 g/dL (ref 6.5–8.1)

## 2022-02-27 LAB — HIV ANTIBODY (ROUTINE TESTING W REFLEX): HIV Screen 4th Generation wRfx: NONREACTIVE

## 2022-02-27 LAB — CBC WITH DIFFERENTIAL/PLATELET
Abs Immature Granulocytes: 0.02 10*3/uL (ref 0.00–0.07)
Basophils Absolute: 0 10*3/uL (ref 0.0–0.1)
Basophils Relative: 1 %
Eosinophils Absolute: 0.2 10*3/uL (ref 0.0–0.5)
Eosinophils Relative: 4 %
HCT: 41.7 % (ref 39.0–52.0)
Hemoglobin: 14.4 g/dL (ref 13.0–17.0)
Immature Granulocytes: 0 %
Lymphocytes Relative: 26 %
Lymphs Abs: 1.6 10*3/uL (ref 0.7–4.0)
MCH: 30 pg (ref 26.0–34.0)
MCHC: 34.5 g/dL (ref 30.0–36.0)
MCV: 86.9 fL (ref 80.0–100.0)
Monocytes Absolute: 0.3 10*3/uL (ref 0.1–1.0)
Monocytes Relative: 4 %
Neutro Abs: 4.2 10*3/uL (ref 1.7–7.7)
Neutrophils Relative %: 65 %
Platelets: 224 10*3/uL (ref 150–400)
RBC: 4.8 MIL/uL (ref 4.22–5.81)
RDW: 12.3 % (ref 11.5–15.5)
WBC: 6.4 10*3/uL (ref 4.0–10.5)
nRBC: 0 % (ref 0.0–0.2)

## 2022-02-27 LAB — ECHOCARDIOGRAM COMPLETE BUBBLE STUDY
Area-P 1/2: 2.54 cm2
S' Lateral: 3.4 cm

## 2022-02-27 LAB — LIPID PANEL
Cholesterol: 136 mg/dL (ref 0–200)
HDL: 28 mg/dL — ABNORMAL LOW (ref >40)
LDL Cholesterol: 93 mg/dL (ref 0–99)
Total CHOL/HDL Ratio: 4.9 RATIO
Triglycerides: 74 mg/dL (ref <150)
VLDL: 15 mg/dL (ref 0–40)

## 2022-02-27 LAB — MAGNESIUM: Magnesium: 2.1 mg/dL (ref 1.7–2.4)

## 2022-02-27 LAB — HEMOGLOBIN A1C
Hgb A1c MFr Bld: 5.4 % (ref 4.8–5.6)
Mean Plasma Glucose: 108.28 mg/dL

## 2022-02-27 IMAGING — MR MR HEAD W/O CM
10 of 11 series · 43 of 48 positions shown · non-contrast
Comparison: Intracranial MRV without and with contrast reported
separately today. Plain head CT [M9] hours today.

CLINICAL DATA: 40-year-old male with code stroke presentation.
Headache, left leg numbness, abnormal speech.

EXAM:
MRI HEAD WITHOUT CONTRAST
TECHNIQUE: Multiplanar, multiecho pulse sequences of the brain and surrounding
structures were obtained without intravenous contrast.

[Series 5: DWI · axial · 3.0mm · 0.88mm/px · z∈[-63,+81]mm · 10 of 100 slices shown (1 of 4)]
[im 1/100]
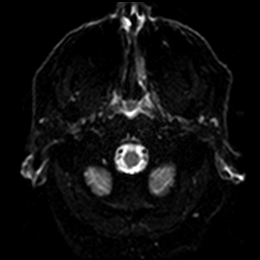
[im 12/100]
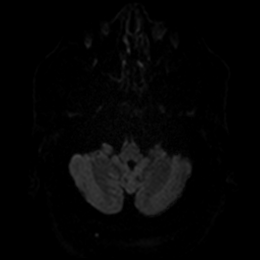
[im 23/100]
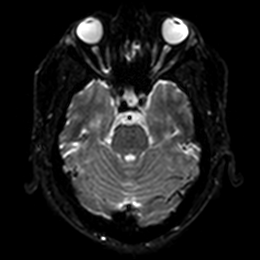
[im 34/100]
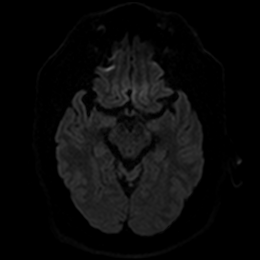
[im 45/100]
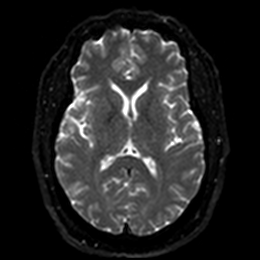
[im 56/100]
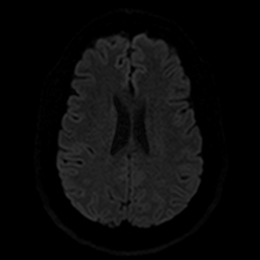
[im 67/100]
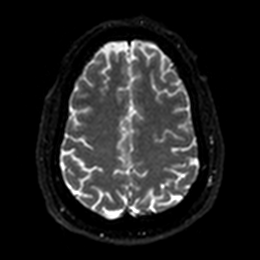
[im 78/100]
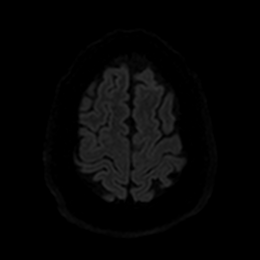
[im 89/100]
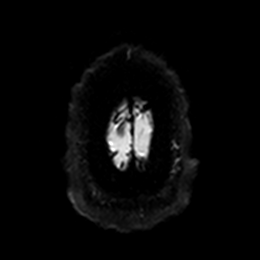
[im 100/100]
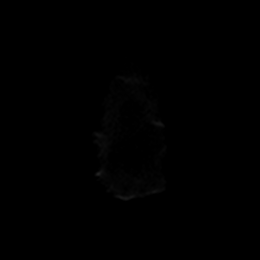

[Series 6: DWI · axial · 3.0mm · 0.88mm/px · z∈[-63,+81]mm · 5 of 50 slices shown (2 of 4)]
[im 1/50]
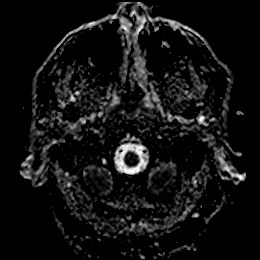
[im 13/50]
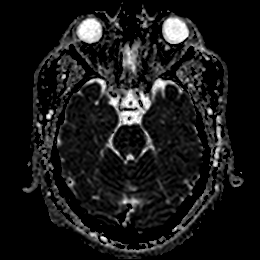
[im 25/50]
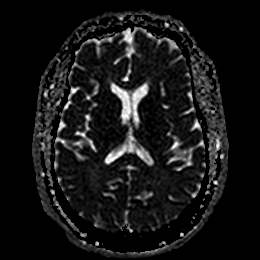
[im 37/50]
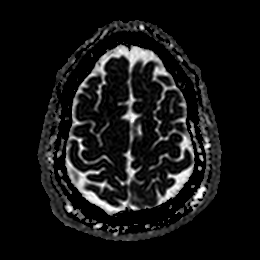
[im 50/50]
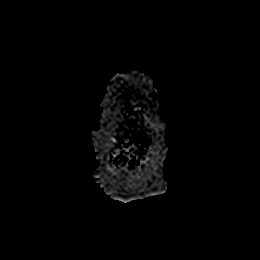

[Series 7: DWI · coronal · 4.0mm · 0.88mm/px · 6 of 66 slices shown (3 of 4)]
[im 1/66]
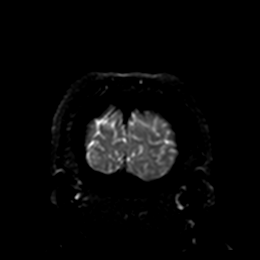
[im 14/66]
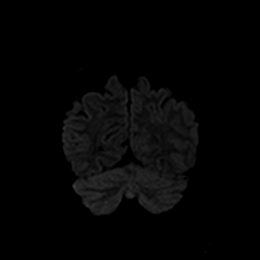
[im 27/66]
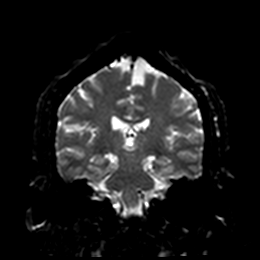
[im 40/66]
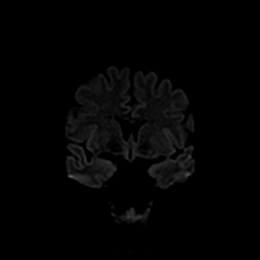
[im 53/66]
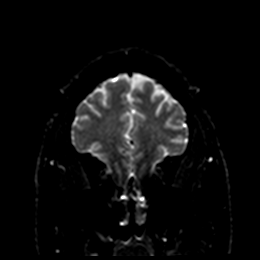
[im 66/66]
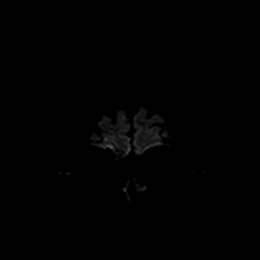

[Series 8: DWI · coronal · 4.0mm · 0.88mm/px · 3 of 33 slices shown (4 of 4)]
[im 1/33]
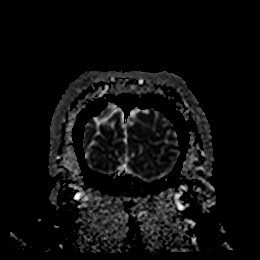
[im 17/33]
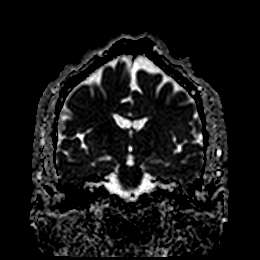
[im 33/33]
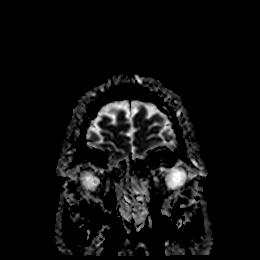

[Series 9: T1 · sagittal · 5.0mm · 0.78mm/px · 2 of 23 slices shown]
[im 1/23]
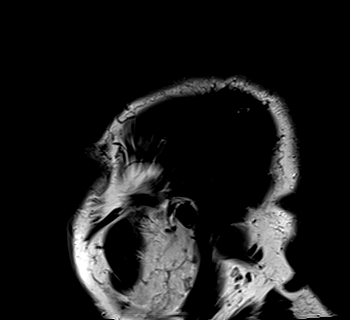
[im 23/23]
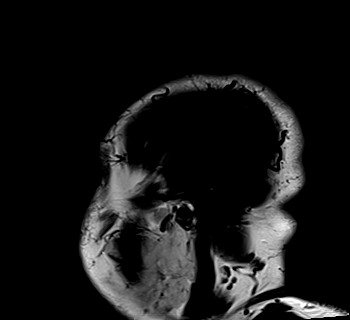

[Series 10: T2 · axial · 5.0mm · 0.72mm/px · z∈[-67,+85]mm · 2 of 27 slices shown (1 of 2)]
[im 1/27]
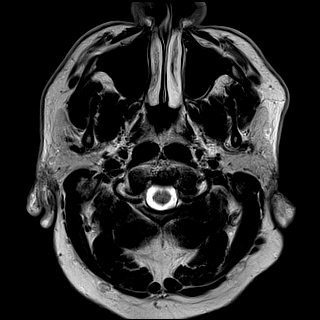
[im 27/27]
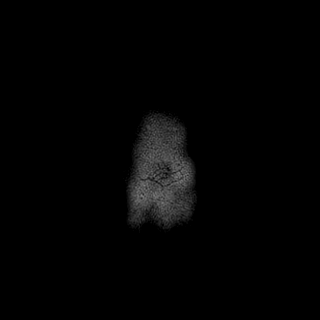

[Series 11: FLAIR · axial · 5.0mm · 0.45mm/px · z∈[-67,+85]mm · 2 of 27 slices shown]
[im 1/27]
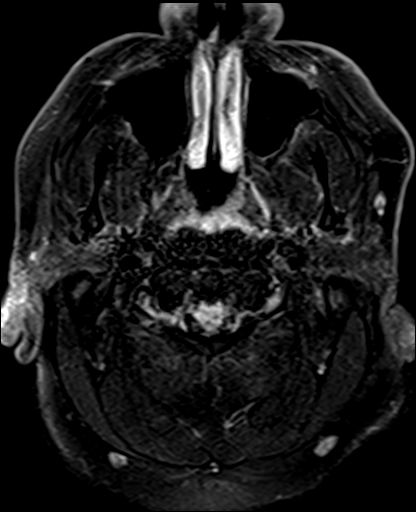
[im 27/27]
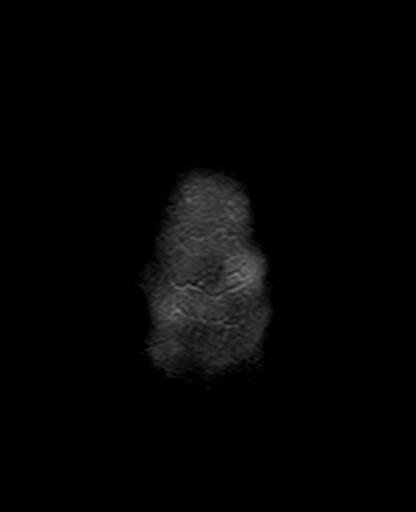

[Series 13: pha_images · axial · 3.0mm · 0.90mm/px · z∈[-78,+89]mm · 5 of 58 slices shown]
[im 1/58]
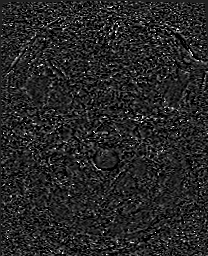
[im 15/58]
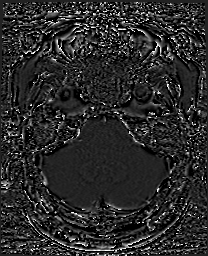
[im 29/58]
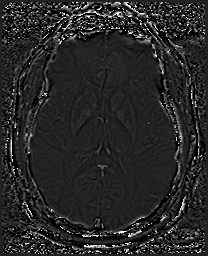
[im 43/58]
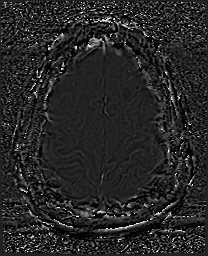
[im 58/58]
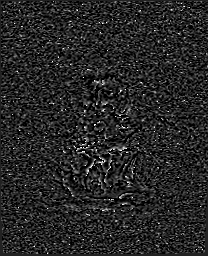

[Series 14: swi_images · axial · 3.0mm · 0.90mm/px · z∈[-78,+95]mm · 5 of 60 slices shown]
[im 1/60]
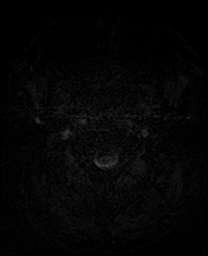
[im 15/60]
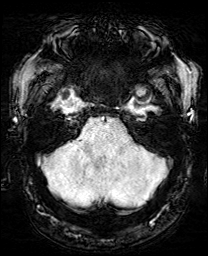
[im 30/60]
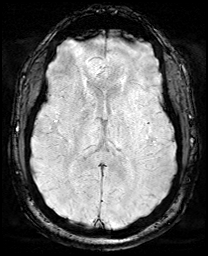
[im 45/60]
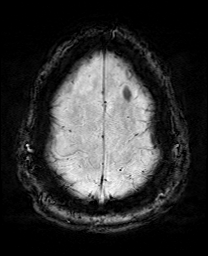
[im 60/60]
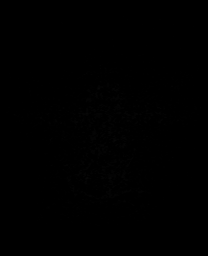

[Series 17: T2 · coronal · 5.0mm · 0.34mm/px · 3 of 29 slices shown (2 of 2)]
[im 1/29]
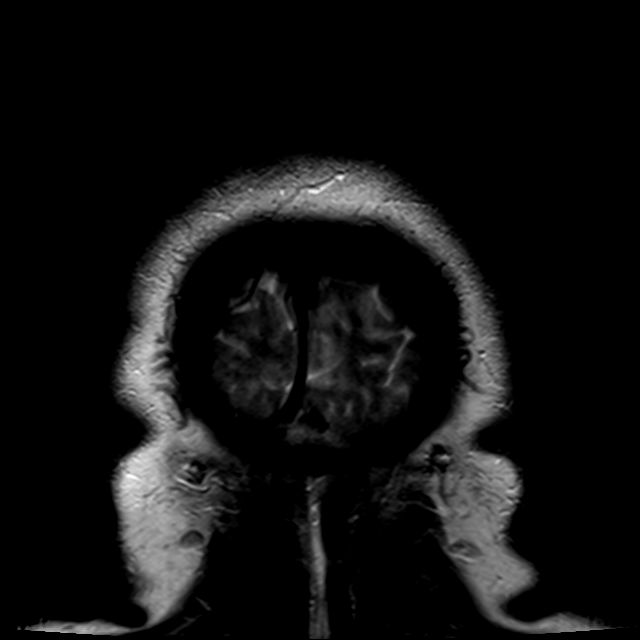
[im 15/29]
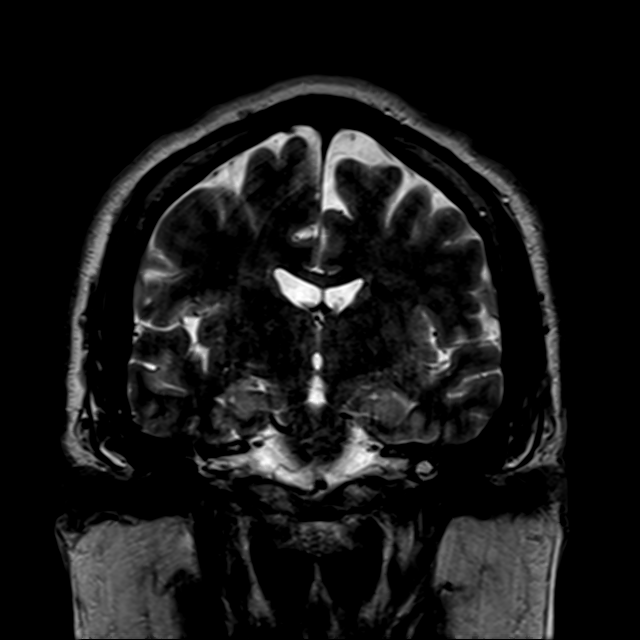
[im 29/29]
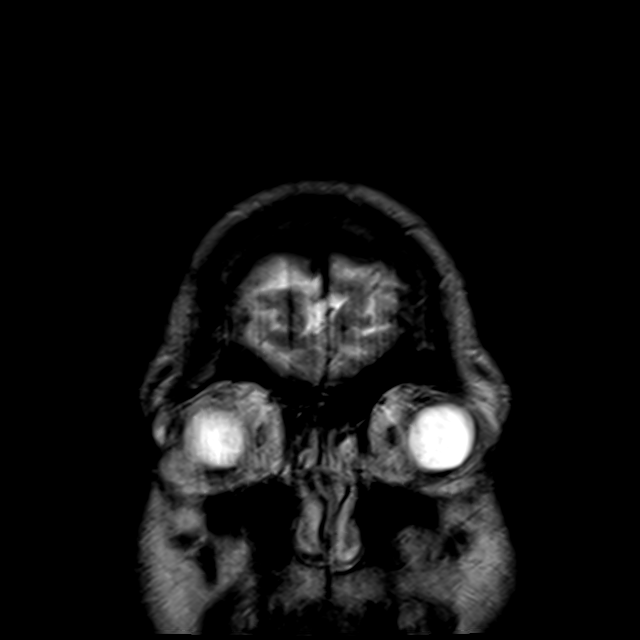

[43 of 48 positions shown; findings below may reference images not displayed]

FINDINGS: Brain: No restricted diffusion to suggest acute infarction. No
midline shift, mass effect, evidence of mass lesion,
ventriculomegaly, extra-axial collection or acute intracranial
hemorrhage. Cervicomedullary junction is within normal limits.

Borderline to mild partially empty sella (series 17, image 19).

Normal background cerebral volume. Gray and white matter signal is
largely normal throughout the brain. No encephalomalacia identified.
However, there is evidence of a chronic microhemorrhage in the left
thalamus on series 14, image 27. No CT correlation there. No other
obvious chronic cerebral blood products.

No dural thickening or abnormal intracranial enhancement identified
on the postcontrast MRV images today, detailed separately.

Vascular: Major intracranial vascular flow voids are preserved.
Comparing pre contrast T1 to the postcontrast MRV images today the
major dural venous sinuses appear to be enhancing and patent (please
see that report)

Skull and upper cervical spine: Negative visible cervical spine.
Visualized bone marrow signal is within normal limits.

Sinuses/Orbits: Negative orbits. Trace ethmoid sinus mucosal
thickening. Well aerated sinuses overall. No sinus fluid levels.

Other: Visible internal auditory structures appear normal. Mastoids
are clear. Negative visible scalp and face.
IMPRESSION: 1. No acute intracranial abnormality.
2. Evidence of a punctate chronic microhemorrhage in the left
thalamus, but otherwise normal MRI signal throughout the brain.
3. However, partially empty sella - often a normal anatomic variant
but can be associated with idiopathic intracranial hypertension
(pseudotumor cerebri).

## 2022-02-27 IMAGING — MR MR MRV HEAD WO/W CM
5 of 6 series · 37 of 48 positions shown · IV contrast (gadavist)
Comparison: Plain head CT [WV] hours. CTA head and neck [DATE].
Brain MRI today reported separately.

CLINICAL DATA: 40-year-old male with code stroke presentation.
Headache, left leg numbness, abnormal speech.

EXAM:
MR VENOGRAM HEAD WITHOUT AND WITH CONTRAST
TECHNIQUE: Angiographic images of the intracranial venous structures were
acquired using MRV technique without and with intravenous contrast.
CONTRAST:  10mL GADAVIST GADOBUTROL 1 MMOL/ML IV SOLN

[Series 5: tof_fl2d_paracor · coronal · 2.0mm · 1.02mm/px · 6 of 128 slices shown]
[im 1/128]
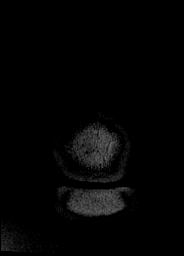
[im 26/128]
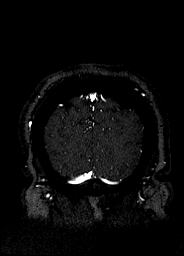
[im 51/128]
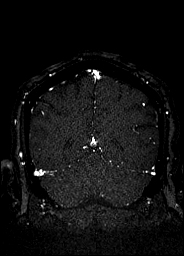
[im 77/128]
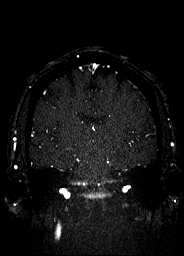
[im 102/128]
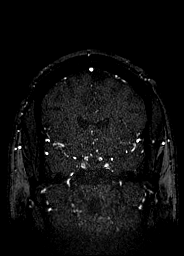
[im 128/128]
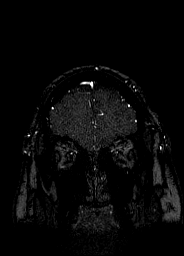

[Series 9: venous inhance coronal · coronal · portal-venous · 0.9mm · 0.57mm/px · 9 of 160 slices shown]
[im 1/160]
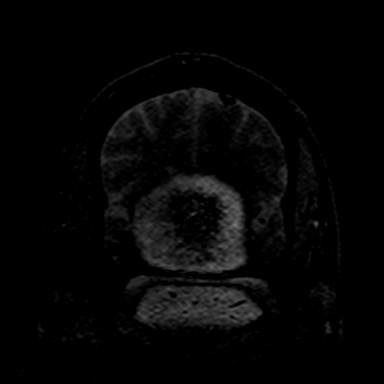
[im 20/160]
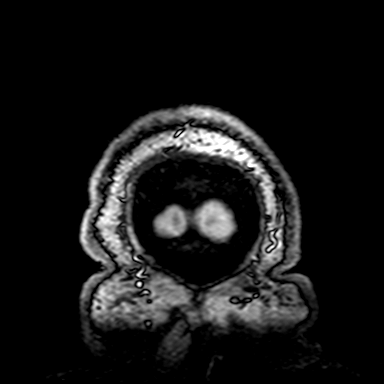
[im 40/160]
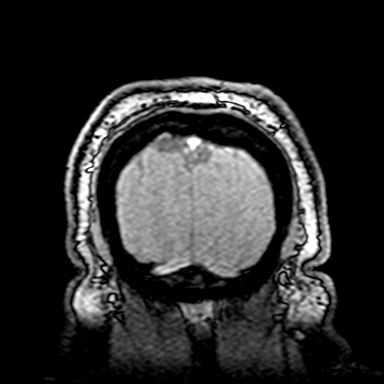
[im 60/160]
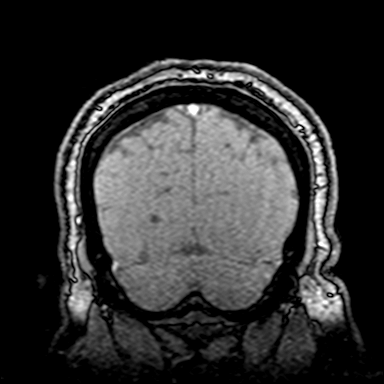
[im 80/160]
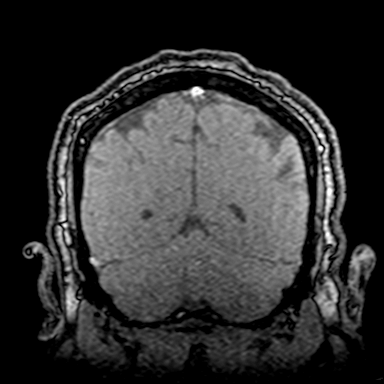
[im 100/160]
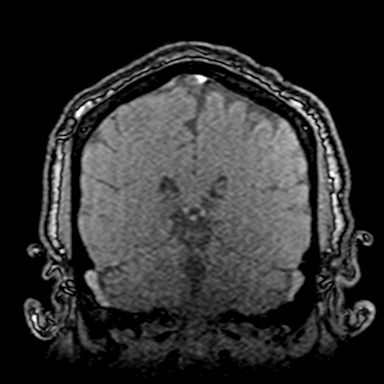
[im 120/160]
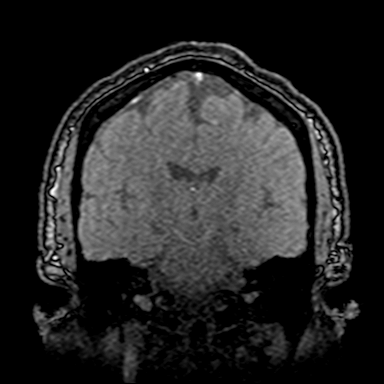
[im 140/160]
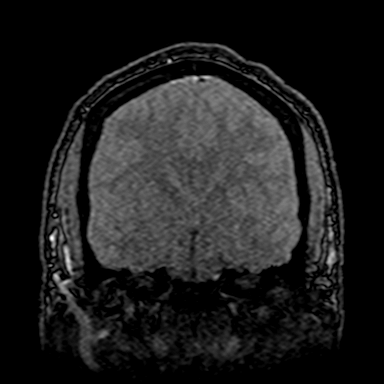
[im 160/160]
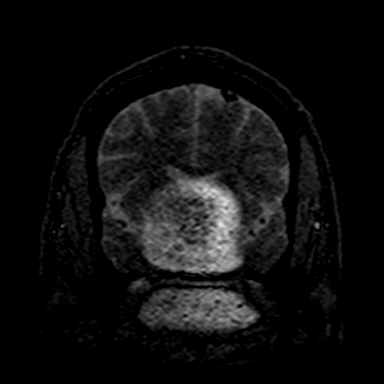

[Series 10: venous inhance coronal_msum · coronal · portal-venous · 0.9mm · 0.57mm/px · 9 of 160 slices shown]
[im 1/160]
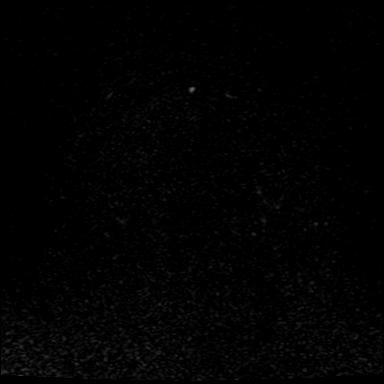
[im 20/160]
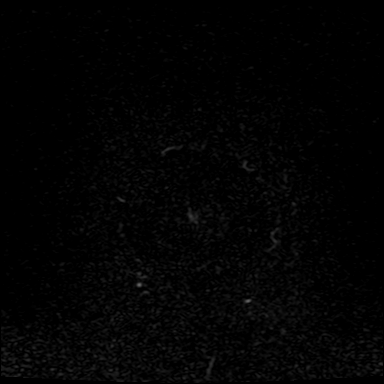
[im 40/160]
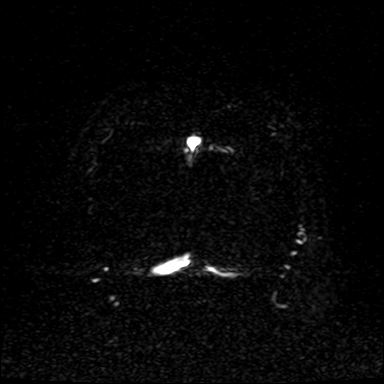
[im 60/160]
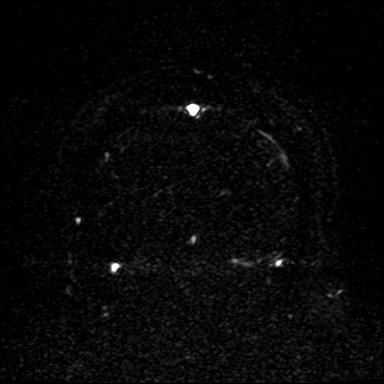
[im 80/160]
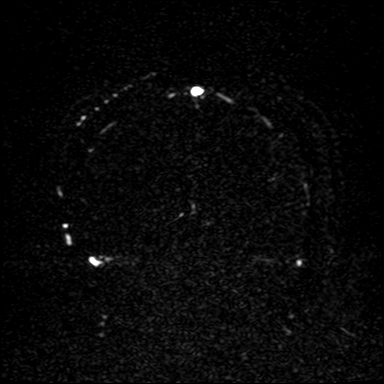
[im 100/160]
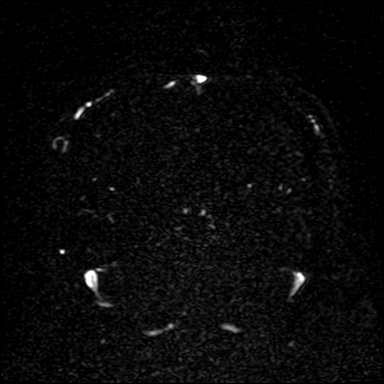
[im 120/160]
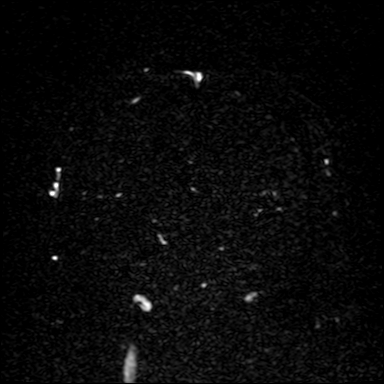
[im 140/160]
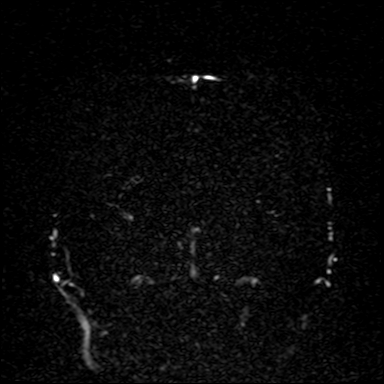
[im 160/160]
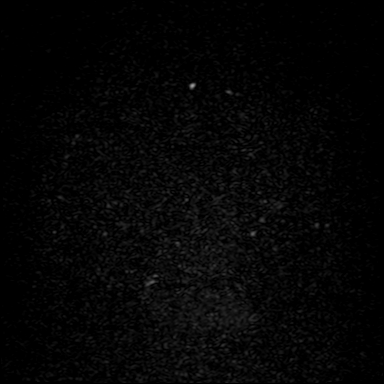

[Series 13: t1_fl3d_sag_p2_iso · sagittal · 1.0mm · 1.02mm/px · 8 of 192 slices shown]
[im 1/192]
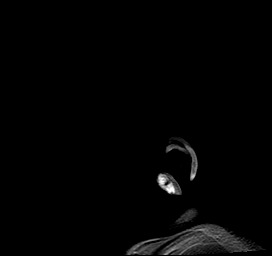
[im 22/192]
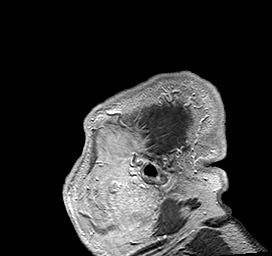
[im 64/192]
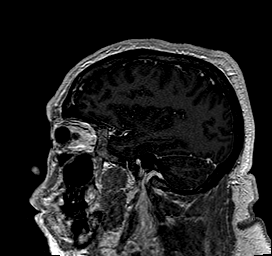
[im 85/192]
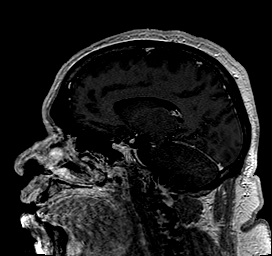
[im 107/192]
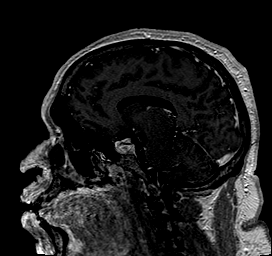
[im 128/192]
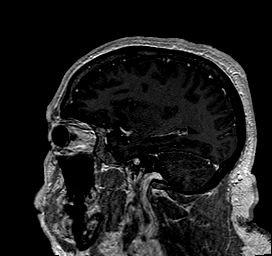
[im 170/192]
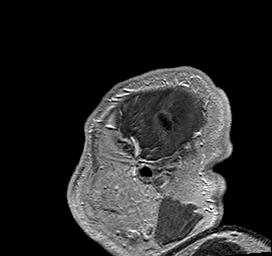
[im 192/192]
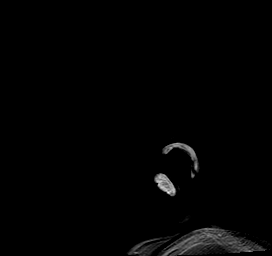

[Series 14: t1_fl3d_sag_p2_iso_mpr_ axial · axial · 2.0mm · 0.45mm/px · z∈[-86,+68]mm · 5 of 120 slices shown]
[im 1/120]
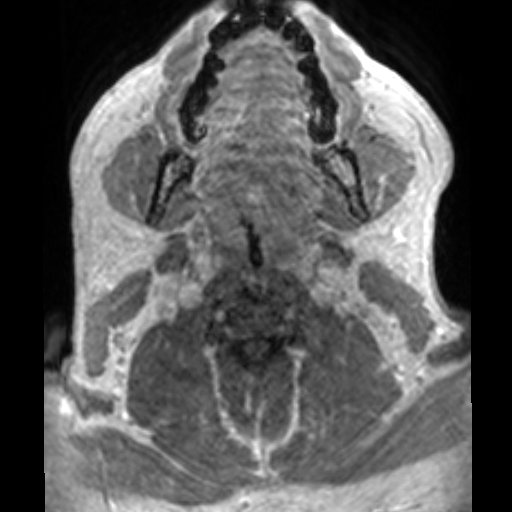
[im 20/120]
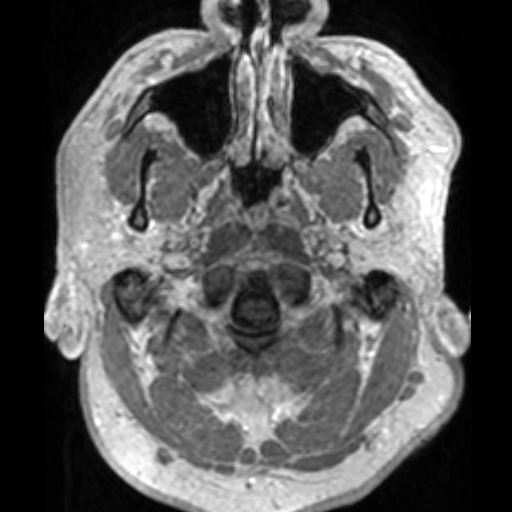
[im 40/120]
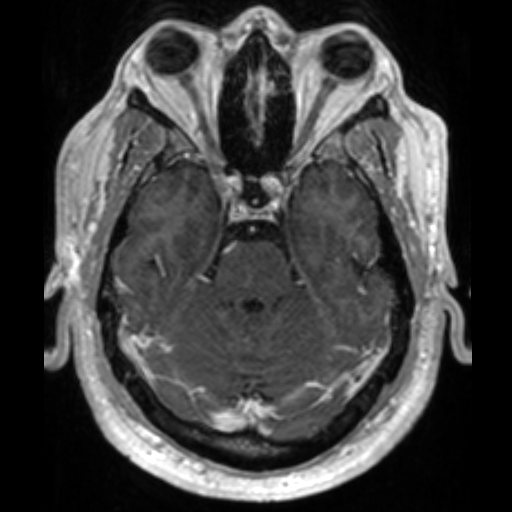
[im 60/120]
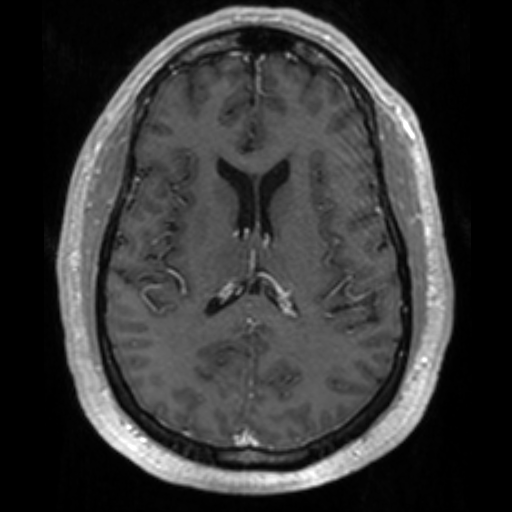
[im 80/120]
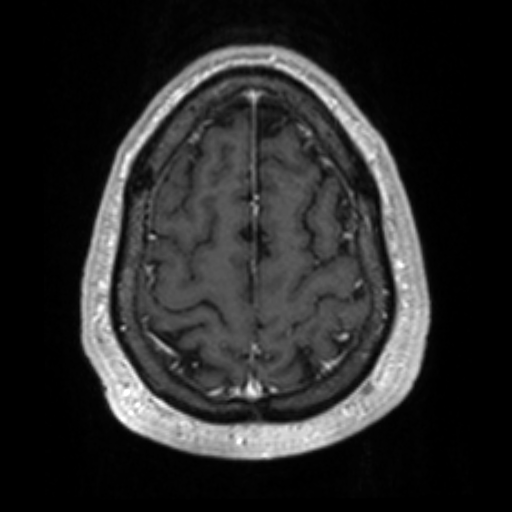

[37 of 48 positions shown; findings below may reference images not displayed]

FINDINGS: Time-of-flight and postcontrast intracranial MRV.

Precontrast time-of-flight flow signal is preserved in the superior
sagittal sinus, torcula, straight sinus, vein of NAGYFI, internal
cerebral veins, bilateral transverse sinuses, bilateral sigmoid
sinuses, and both IJ bulbs. The right transverse and sigmoid sinuses
appear mildly dominant. Flow signal also appears preserved in the
major draining cortical veins.

Postcontrast images demonstrate preserved enhancement of those dural
venous structures as well as the bilateral cavernous sinus. There is
a mildly effaced appearance of the bilateral transverse-sigmoid
sinus junctions.

No abnormal enhancement identified. No dural thickening identified.
Brain MRI is reported separately.
IMPRESSION: 1. Negative for dural venous sinus thrombosis.
2. Mildly effaced appearance of both transverse-sigmoid venous sinus
junctions, a finding which can be seen with idiopathic intracranial
hypertension (pseudotumor cerebri).

## 2022-02-27 MED ORDER — ACETAMINOPHEN 650 MG RE SUPP: 650.0000 mg | Freq: Four times a day (QID) | RECTAL | Status: DC | PRN

## 2022-02-27 MED ORDER — HYDRALAZINE HCL 50 MG PO TABS
75.0000 mg | ORAL_TABLET | Freq: Two times a day (BID) | ORAL | Status: DC
  Administered 2022-02-27: 75 mg via ORAL
  Filled 2022-02-27: qty 1

## 2022-02-27 MED ORDER — STROKE: EARLY STAGES OF RECOVERY BOOK
Freq: Once | Status: AC
Start: 2022-02-27 — End: 2022-02-27
  Filled 2022-02-27: qty 1

## 2022-02-27 MED ORDER — ASPIRIN 81 MG PO TBEC
81.0000 mg | DELAYED_RELEASE_TABLET | Freq: Every day | ORAL | Status: DC
  Administered 2022-02-27: 81 mg via ORAL
  Filled 2022-02-27: qty 1

## 2022-02-27 MED ORDER — HYDRALAZINE HCL 50 MG PO TABS
50.0000 mg | ORAL_TABLET | Freq: Two times a day (BID) | ORAL | Status: DC
Start: 2022-02-27 — End: 2022-02-27

## 2022-02-27 MED ORDER — HYDRALAZINE HCL 20 MG/ML IJ SOLN: 10.0000 mg | Freq: Four times a day (QID) | INTRAMUSCULAR | Status: DC | PRN

## 2022-02-27 MED ORDER — ENOXAPARIN SODIUM 80 MG/0.8ML IJ SOSY: 80.0000 mg | PREFILLED_SYRINGE | INTRAMUSCULAR | Status: DC

## 2022-02-27 MED ORDER — AMLODIPINE BESYLATE 10 MG PO TABS
10.0000 mg | ORAL_TABLET | Freq: Every day | ORAL | Status: DC
  Administered 2022-02-27: 10 mg via ORAL
  Filled 2022-02-27: qty 1

## 2022-02-27 MED ORDER — TOPIRAMATE 50 MG PO TABS: 50.0000 mg | ORAL_TABLET | Freq: Two times a day (BID) | ORAL | 2 refills | Status: DC

## 2022-02-27 MED ORDER — GADOBUTROL 1 MMOL/ML IV SOLN
10.0000 mL | Freq: Once | INTRAVENOUS | Status: AC | PRN
Start: 2022-02-27 — End: 2022-02-27
  Administered 2022-02-27: 10 mL via INTRAVENOUS

## 2022-02-27 MED ORDER — LABETALOL HCL 200 MG PO TABS
200.0000 mg | ORAL_TABLET | Freq: Two times a day (BID) | ORAL | Status: DC
  Administered 2022-02-27: 200 mg via ORAL
  Filled 2022-02-27: qty 1

## 2022-02-27 MED ORDER — ONDANSETRON HCL 4 MG/2ML IJ SOLN: 4.0000 mg | Freq: Four times a day (QID) | INTRAMUSCULAR | Status: DC | PRN

## 2022-02-27 MED ORDER — POLYETHYLENE GLYCOL 3350 17 G PO PACK: 17.0000 g | PACK | Freq: Every day | ORAL | Status: DC | PRN

## 2022-02-27 MED ORDER — ENOXAPARIN SODIUM 40 MG/0.4ML IJ SOSY
40.0000 mg | PREFILLED_SYRINGE | INTRAMUSCULAR | Status: DC
  Administered 2022-02-27: 40 mg via SUBCUTANEOUS
  Filled 2022-02-27: qty 0.4

## 2022-02-27 MED ORDER — IRBESARTAN 150 MG PO TABS
150.0000 mg | ORAL_TABLET | Freq: Every day | ORAL | Status: DC
  Administered 2022-02-27: 150 mg via ORAL
  Filled 2022-02-27: qty 1

## 2022-02-27 MED ORDER — LOSARTAN POTASSIUM 50 MG PO TABS
50.0000 mg | ORAL_TABLET | Freq: Every day | ORAL | Status: DC
Start: 2022-02-27 — End: 2022-02-27

## 2022-02-27 MED ORDER — ONDANSETRON HCL 4 MG PO TABS: 4.0000 mg | ORAL_TABLET | Freq: Four times a day (QID) | ORAL | Status: DC | PRN

## 2022-02-27 MED ORDER — PANTOPRAZOLE SODIUM 40 MG PO TBEC
40.0000 mg | DELAYED_RELEASE_TABLET | Freq: Every day | ORAL | Status: DC
Start: 2022-02-27 — End: 2022-02-27
  Administered 2022-02-27: 40 mg via ORAL
  Filled 2022-02-27: qty 1

## 2022-02-27 MED ORDER — ACETAMINOPHEN 325 MG PO TABS: 650.0000 mg | ORAL_TABLET | Freq: Four times a day (QID) | ORAL | Status: DC | PRN

## 2022-02-27 NOTE — Assessment & Plan Note (Addendum)
   Patient presenting with several week history of waxing and waning occipital headaches with various neurologic complaints including blurry vision, difficulty with speech and tingling  Evaluated via teleneurology with Dr. Cheral Marker on 6/2 while patient was at [facility], recommending transfer to [facility] for complete work-up including MRI brain, EEG and echocardiogram  Differential diagnoses include acute stroke, idiopathic intracranial hypertension, partial complex seizures or complicated migraines  Performing serial neurologic checks Monitoring patient on telemetry Initiating antiplatelet therapy including aspirin 325 mg now followed by 81 mg daily. Obtaining lipid panel Further imaging to include: MRI of brain without contrast as well as MRV. Obtaining hemoglobin A1c and lipid panel in the morning Seeing as how patient is currently neurologically at baseline I do not believe that permissive hypertension is necessary at this time. Echocardiogram in the morning with bubble study PT, OT, SLP evaluation Neurology following in consultation.

## 2022-02-27 NOTE — Assessment & Plan Note (Addendum)
Baseline 1.4-1.8

## 2022-02-27 NOTE — Progress Notes (Signed)
Pt off of unit to MRI

## 2022-02-27 NOTE — Consult Note (Signed)
Neurology Consultation  Reason for Consult: Code stroke @ [facility]  Referring Physician: Dr. Loleta Books   CC: aphasia   History is obtained from:medical record   HPI: Calvin Bailey is a 41 y.o. male 41 year old male with a PMHx of supermorbid obesity, HTN, obesity hypoventilation syndrome, OSA, GERD and dizziness, who presents to the [facility] ED with a 3 week history of recurrent neurological symptoms consisting of episodic posterior/occipital non-throbbing headaches with radiation to the neck in conjunction with transient expressive aphasia, left upper and lower extremity numbness, bilateral hand tremor, confusion and altered sensorium (described as "feels like I am outside my body"). The spells have all resolved within 10 minutes, including the episode he experienced today. What made him worry about today's episode was that the speech deficit was more intense and following return of speech, he felt very worn out and confused for about an hour, which was a new symptom. He states that with today's spell, his posterior headache hit a maximum pain level of 8/10 and since then has gradually improved to 5/10 at the time of Teleneurology evaluation. He has noticed that his headaches over the past 3 weeks have worsened in pian level with increases in his BP and conversely have decreased in intensity when his BP is lower. He is a smoker and has noticed that immediately upon inhaling tobacco smoke, his headache "becomes 10 times worse". He does have a prior history of headaches that he states are not migraines, which have not previously worsened while smoking.  Spell yesterday began at 2:30 PM with the first noticeable symptom being that he "couldn't find my words or speak", following which "I felt off" for one hour. BP was 168/87. He also endorses experiencing pulsatile tinnitus "once in a while" and has also experienced bilateral blurring of his vision when bending over.  He was transferred to [facility] for further  workup   ROS: Full ROS was performed and is negative except as noted in the HPI.  Past Medical History:  Diagnosis Date   Dizziness 09/25/2019   GERD (gastroesophageal reflux disease)    HTN (hypertension)    Obesity hypoventilation syndrome (HCC) 04/08/2014   OSA (obstructive sleep apnea) 04/08/2014   Sleep apnea      Family History  Problem Relation Age of Onset   Hypertension Mother    Multiple sclerosis Mother    Hypertension Father    Hypertension Brother    Hyperlipidemia Paternal Grandfather    Heart failure Paternal Grandfather    Diabetes Maternal Grandmother    Heart failure Maternal Grandfather    CVA Maternal Uncle      Social History:   reports that he has been smoking cigarettes. He has a 16.00 pack-year smoking history. He has never used smokeless tobacco. He reports current alcohol use. He reports that he does not use drugs.  Medications  Current Facility-Administered Medications:    acetaminophen (TYLENOL) tablet 650 mg, 650 mg, Oral, Q6H PRN **OR** acetaminophen (TYLENOL) suppository 650 mg, 650 mg, Rectal, Q6H PRN, Shalhoub, Sherryll Burger, MD   amLODipine (NORVASC) tablet 10 mg, 10 mg, Oral, Daily, Shalhoub, Sherryll Burger, MD, 10 mg at 02/27/22 2094   aspirin EC tablet 81 mg, 81 mg, Oral, Daily, Shalhoub, Sherryll Burger, MD, 81 mg at 02/27/22 0836   [START ON 02/28/2022] enoxaparin (LOVENOX) injection 80 mg, 80 mg, Subcutaneous, Q24H, Reome, Earle J, RPH   hydrALAZINE (APRESOLINE) injection 10 mg, 10 mg, Intravenous, Q6H PRN, Shalhoub, Sherryll Burger, MD  hydrALAZINE (APRESOLINE) tablet 75 mg, 75 mg, Oral, BID, Shalhoub, Sherryll Burger, MD, 75 mg at 02/27/22 0836   irbesartan (AVAPRO) tablet 150 mg, 150 mg, Oral, Daily, Shalhoub, Sherryll Burger, MD, 150 mg at 02/27/22 0836   labetalol (NORMODYNE) tablet 200 mg, 200 mg, Oral, BID, Shalhoub, Sherryll Burger, MD, 200 mg at 02/27/22 0836   ondansetron (ZOFRAN) tablet 4 mg, 4 mg, Oral, Q6H PRN **OR** ondansetron (ZOFRAN) injection 4 mg, 4 mg,  Intravenous, Q6H PRN, Shalhoub, Sherryll Burger, MD   pantoprazole (PROTONIX) EC tablet 40 mg, 40 mg, Oral, Daily, Shalhoub, Sherryll Burger, MD, 40 mg at 02/27/22 0836   polyethylene glycol (MIRALAX / GLYCOLAX) packet 17 g, 17 g, Oral, Daily PRN, Vernelle Emerald, MD   Exam: Current vital signs: BP (!) 155/92   Pulse 60   Temp 98.4 F (36.9 C) (Oral)   Resp 14   Ht 6\' 1"  (1.854 m)   Wt (!) 173.6 kg   SpO2 99%   BMI 50.49 kg/m  Vital signs in last 24 hours: Temp:  [97.6 F (36.4 C)-98.7 F (37.1 C)] 98.4 F (36.9 C) (06/03 0741) Pulse Rate:  [35-67] 60 (06/03 0743) Resp:  [14-21] 14 (06/03 0741) BP: (121-172)/(57-95) 155/92 (06/03 0743) SpO2:  [94 %-100 %] 99 % (06/03 0741) Weight:  [172.4 kg-173.6 kg] 173.6 kg (06/02 2214)  GENERAL: Awake, alert in NAD HEENT: - Normocephalic and atraumatic, dry mm LUNGS - Clear to auscultation bilaterally with no wheezes CV - S1S2 RRR, no m/r/g, equal pulses bilaterally. ABDOMEN - Soft, nontender, nondistended with normoactive BS Ext: warm, well perfused, intact peripheral pulses, no edema  NEURO:  Mental Status: AA&Ox3  Language: speech is  intact to  Naming, repetition, fluency, and comprehension intact. Cranial Nerves: PERRL89mm/brisk. EOMI, visual fields full, no facial asymmetry, facial sensation intact, hearing intact, tongue/uvula/soft palate midline, normal sternocleidomastoid and trapezius muscle strength. No evidence of tongue atrophy or fibrillations Motor:  Tone: is normal and bulk is normal Sensation- Intact to light touch bilaterally Coordination: FTN intact bilaterally, no ataxia in BLE. Gait- deferred  NIHSS: 0   Imaging I have reviewed the images obtained:  Code stroke CT-head 6/2: No acute intracranial pathology. ASPECTS is 10   MRI examination of the brain 6/3: 1. No acute intracranial abnormality. 2. Evidence of a punctate chronic microhemorrhage in the left thalamus, but otherwise normal MRI signal throughout the  brain. 3. However, partially empty sella - often a normal anatomic variant but can be associated with idiopathic intracranial hypertension (pseudotumor cerebri).  MRV head 6/3: 1. Negative for dural venous sinus thrombosis. 2. Mildly effaced appearance of both transverse-sigmoid venous sinus junctions, a finding which can be seen with idiopathic intracranial hypertension (pseudotumor cerebri).   rEEG 6/3: This study is within normal limits. No seizures or epileptiform discharges were seen throughout the recording.  Assessment:  Calvin Bailey is a 41 y.o. male 41 year old male with a PMHx of supermorbid obesity, HTN, obesity hypoventilation syndrome, OSA, GERD and dizziness, who presents to the [facility] ED with a 3 week history of recurrent neurological symptoms consisting of episodic posterior/occipital non-throbbing headaches with radiation to the neck in conjunction with transient expressive aphasia, left upper and lower extremity numbness, bilateral hand tremor, confusion and altered sensorium (described as "feels like I am outside my body"). The spells have all resolved within 10 minutes, including the episode he experienced today. What made him worry about today's episode was that the speech deficit was more intense and following  return of speech, he felt very worn out and confused for about an hour, which was a new symptom. Spell yesterday began at 2:30 PM with the first noticeable symptom being that he "couldn't find my words or speak", following which "I felt off" for one hour. BP was 168/87. He also endorses experiencing pulsatile tinnitus "once in a while" and has also experienced bilateral blurring of his vision when bending over.  Complex Migraine:   Recommendations: EEG is neg.  Start topamax 50mg  BID renally dosed.  He may benefit from occipital nerve blocks.  F/u with outp neurology, he has an apt in  1 month for migraine mgt.   Provided reassurance that he does not have a  CVA.  Discussed with pt and sig other.   Discussed with admitted physician. Ok to discharge home.    Total of 35 mins spent reviewing chart, discussion with patient and family on prognosis, Dx and plan. Discussed case with patient's nurse. Reviewed Imaging personally.

## 2022-02-27 NOTE — Assessment & Plan Note (Signed)
   Continue home regimen of CPAP nightly

## 2022-02-27 NOTE — Assessment & Plan Note (Signed)
.   No clinical evidence of cardiogenic volume overload . Temporarily holding diuretics . Strict input and output monitoring . Daily weights . Low-sodium diet

## 2022-02-27 NOTE — Assessment & Plan Note (Signed)
.   Patient is being counseled daily on smoking cessation. . Patient declining nicotine replacement therapy  

## 2022-02-27 NOTE — Plan of Care (Signed)
  Problem: Health Behavior/Discharge Planning: Goal: Ability to manage health-related needs will improve Outcome: Progressing   Problem: Clinical Measurements: Goal: Ability to maintain clinical measurements within normal limits will improve Outcome: Progressing   

## 2022-02-27 NOTE — Evaluation (Signed)
Occupational Therapy Evaluation Patient Details Name: Calvin Bailey MRN: [id number] DOB: 09/24/1981 Today's Date: 02/27/2022   History of Present Illness Pt is a 41 y/o M presenting to ED on 6/2 with expressive aphasia, LUE/LLE numbness, and headache x3 weeks. EEG negative. MRI revealing punctate chronic microhemorrhage in L thalamus. PMH includes HTN, obesity hypoventilation syndrome, OSA, GERD, and dizziness.   Clinical Impression   Pt independent at baseline for ADLs/functional mobility.  Lives with spouse who can provide assist at d/c. Pt currently  mod I -minA for ADLs and transfers without AD. Pt reporting symptoms have resolved since admission, cognition and bil strength WFL. Pt able to complete hallway ambulation without LOB, educated pt/family on safety with showering at home, verbalized understanding. Pt presenting with impairments listed below, however has no acute OT needs at this time, will s/o. Recommend d/c home with family assistance.     Recommendations for follow up therapy are one component of a multi-disciplinary discharge planning process, led by the attending physician.  Recommendations may be updated based on patient status, additional functional criteria and insurance authorization.   Follow Up Recommendations  No OT follow up    Assistance Recommended at Discharge Set up Supervision/Assistance  Patient can return home with the following A little help with bathing/dressing/bathroom;Assist for transportation    Functional Status Assessment  Patient has had a recent decline in their functional status and demonstrates the ability to make significant improvements in function in a reasonable and predictable amount of time.  Equipment Recommendations  None recommended by OT    Recommendations for Other Services       Precautions / Restrictions Precautions Precautions: None Restrictions Weight Bearing Restrictions: No      Mobility Bed Mobility                General bed mobility comments: sitting EOB upon arrival    Transfers Overall transfer level: Modified independent Equipment used: None                      Balance Overall balance assessment: No apparent balance deficits (not formally assessed)                                         ADL either performed or assessed with clinical judgement   ADL Overall ADL's : Needs assistance/impaired Eating/Feeding: Modified independent;Sitting   Grooming: Modified independent;Standing   Upper Body Bathing: Modified independent;Standing   Lower Body Bathing: Sitting/lateral leans;Sit to/from stand;Minimal assistance   Upper Body Dressing : Modified independent;Standing Upper Body Dressing Details (indicate cue type and reason): to don shirt Lower Body Dressing: Sitting/lateral leans;Sit to/from stand;Minimal assistance   Toilet Transfer: Modified Independent;Ambulation;Regular Toilet   Toileting- Water quality scientist and Hygiene: Modified independent;Sitting/lateral lean;Sit to/from stand       Functional mobility during ADLs: Modified independent       Vision Ability to See in Adequate Light: 0 Adequate Patient Visual Report: No change from baseline Vision Assessment?: No apparent visual deficits Additional Comments: pt able to read/identify room number when returning from hallway ambulation     Perception     Praxis      Pertinent Vitals/Pain Pain Assessment Pain Assessment: No/denies pain     Hand Dominance Left   Extremity/Trunk Assessment Upper Extremity Assessment Upper Extremity Assessment: Overall WFL for tasks assessed   Lower Extremity Assessment Lower Extremity Assessment: Overall  WFL for tasks assessed   Cervical / Trunk Assessment Cervical / Trunk Assessment: Normal   Communication Communication Communication: Expressive difficulties (mild, 1 instance during session)   Cognition Arousal/Alertness: Awake/alert Behavior  During Therapy: WFL for tasks assessed/performed Overall Cognitive Status: Within Functional Limits for tasks assessed                                 General Comments: scoring 3 on SBT indicative of normal cognition     General Comments  VSS on RA, spouse present during session    Exercises     Shoulder Instructions      Home Living Family/patient expects to be discharged to:: Private residence Living Arrangements: Spouse/significant other;Children Available Help at Discharge: Family;Available PRN/intermittently Type of Home: Apartment Home Access: Level entry     Home Layout: One level     Bathroom Shower/Tub: Tub/shower unit         Home Equipment: None          Prior Functioning/Environment Prior Level of Function : Independent/Modified Independent             Mobility Comments: no AD use ADLs Comments: does IADLs; works 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        OT Problem List:        OT Treatment/Interventions:      OT Goals(Current goals can be found in the care plan section) Acute Rehab OT Goals Patient Stated Goal: none stated OT Goal Formulation: With patient Time For Goal Achievement: 03/13/22 Potential to Achieve Goals: Good  OT Frequency:      Co-evaluation              AM-PAC OT "6 Clicks" Daily Activity     Outcome Measure Help from another person eating meals?: None Help from another person taking care of personal grooming?: None Help from another person toileting, which includes using toliet, bedpan, or urinal?: None Help from another person bathing (including washing, rinsing, drying)?: A Little Help from another person to put on and taking off regular upper body clothing?: None Help from another person to put on and taking off regular lower body clothing?: A Little 6 Click Score: 22   End of Session Nurse Communication: Mobility status  Activity Tolerance: Patient tolerated treatment well Patient  left: in bed;with call bell/phone within reach  OT Visit Diagnosis: Unsteadiness on feet (R26.81);Muscle weakness (generalized) (M62.81);Other abnormalities of gait and mobility (R26.89)                Time: 1005-1018 OT Time Calculation (min): 13 min Charges:  OT General Charges $OT Visit: 1 Visit OT Evaluation $OT Eval Low Complexity: 1 Low  Lynnda Child, OTD, OTR/L Acute Rehab [phone] - [place] 02/27/2022, 10:35 AM

## 2022-02-27 NOTE — Procedures (Signed)
Patient Name: Calvin Bailey  MRN: [id number]  Epilepsy Attending: Lora Havens  Referring Physician/Provider: Vernelle Emerald, MD Date: 02/27/2022 Duration: 25.27 mins  Patient history: 41 year old male presenting with a 3 week history of intermittent occipital headaches accompanied by transient expressive aphasia. His episode today was the worst so far, being associated with left sided numbness, hand tremors, altered sensorium and a 1 hour period of "feeling off" with confusion and sensation of tiredness after his speech deficit resolved. EEG to evaluate for seizure  Level of alertness: Awake  AEDs during EEG study: None  Technical aspects: This EEG study was done with scalp electrodes positioned according to the 10-20 International system of electrode placement. Electrical activity was acquired at a sampling rate of 500Hz  and reviewed with a high frequency filter of 70Hz  and a low frequency filter of 1Hz . EEG data were recorded continuously and digitally stored.   Description: The posterior dominant rhythm consists of 10 Hz activity of moderate voltage (25-35 uV) seen predominantly in posterior head regions, symmetric and reactive to eye opening and eye closing. Physiologic photic driving was not seen during photic stimulation.  Hyperventilation was not performed.     IMPRESSION: This study is within normal limits. No seizures or epileptiform discharges were seen throughout the recording.  Calvin Bailey

## 2022-02-27 NOTE — H&P (Addendum)
History and Physical    Patient: Calvin Bailey MRN: [id number] [place]: 02/26/2022  Date of Service: the patient was seen and examined on 02/27/2022  Patient coming from: Home via [facility]  Chief Complaint:  Chief Complaint  Patient presents with   Aphasia    HPI:   41 year old male with past medical history of chronic diastolic congestive heart failure (Echo 09/2020 EF 60-65% with G1DD), gastroesophageal reflux disease, obstructive sleep apnea (on CPAP), nicotine dependence, obesity hypoventilation syndrome, hypertension who presented to [facility] emergency department with complaints of headaches, blurry vision and difficulty speaking.  Patient explains that approximately 3 weeks ago he began to experience headaches.  Patient's headaches are located in the occipital region, radiate to the top of the scalp, are waxing and waning in intensity, are dull in quality and are not associated with any alleviating or exacerbating factors.  Patient's symptoms continue to worsen over the next several weeks and as they worsened patient began to experience intermittent difficulty with speaking and blurry vision.   Earlier in the day on 6/2, patient suffered an particularly severe episode and was associated with left-sided numbness and hand tremors.  Patient denies any fevers, drug use, alcohol use, confusion or sick contacts.  Due to patient's worsening symptoms patient eventually presented to [facility] for evaluation.  Upon evaluation in the emergency department initial noncontrast CT imaging of the head was unremarkable.  Due to patient's concerning neurologic symptoms teleneurology consultation was obtained with Dr. Cheral Marker.  Dr. Was and recommended transfer to [facility] for MRI of the brain, EEG and additional studies and therefore the hospitalist group was called and the patient was accepted for transfer to [facility].  Review of Systems: Review of Systems  Eyes:   Positive for blurred vision.  Neurological:  Positive for tingling and headaches.    Past Medical History:  Diagnosis Date   Dizziness 09/25/2019   GERD (gastroesophageal reflux disease)    HTN (hypertension)    Obesity hypoventilation syndrome ([place]) 04/08/2014   OSA (obstructive sleep apnea) 04/08/2014   Sleep apnea     Past Surgical History:  Procedure Laterality Date   two knee surgeries Left 1995    Social History:  reports that he has been smoking cigarettes. He has a 16.00 pack-year smoking history. He has never used smokeless tobacco. He reports current alcohol use. He reports that he does not use drugs.  Allergies  Allergen Reactions   Chlorthalidone Nausea Only   Lisinopril Cough    Family History  Problem Relation Age of Onset   Hypertension Mother    Multiple sclerosis Mother    Hypertension Father    Hypertension Brother    Hyperlipidemia Paternal Grandfather    Heart failure Paternal Grandfather    Diabetes Maternal Grandmother    Heart failure Maternal Grandfather    CVA Maternal Uncle     Prior to Admission medications   Medication Sig Start Date End Date Taking? Authorizing Provider  amLODipine (NORVASC) 10 MG tablet Take 1 tablet (10 mg total) by mouth daily. 02/05/22   Tobb, Kardie, DO  hydrALAZINE (APRESOLINE) 50 MG tablet Take 1.5 tablets (75 mg total) by mouth in the morning and at bedtime. 02/05/22   Tobb, Kardie, DO  labetalol (NORMODYNE) 200 MG tablet Take 1 tablet (200 mg total) by mouth 2 (two) times daily. 02/05/22   Tobb, Godfrey Pick, DO  Semaglutide-Weight Management 0.5 MG/0.5ML SOAJ Inject 0.5 mg into the skin once a  week for 28 days. 02/26/22 03/26/22  Tobb, Godfrey Pick, DO  Semaglutide-Weight Management 1 MG/0.5ML SOAJ Inject 1 mg into the skin once a week for 28 days. 03/27/22 04/24/22  Tobb, Godfrey Pick, DO  Semaglutide-Weight Management 1.7 MG/0.75ML SOAJ Inject 1.7 mg into the skin once a week for 28 days. 04/25/22 05/23/22  Tobb, Godfrey Pick, DO   Semaglutide-Weight Management 2.4 MG/0.75ML SOAJ Inject 2.4 mg into the skin once a week for 28 days. 05/24/22 06/21/22  Tobb, Godfrey Pick, DO  spironolactone (ALDACTONE) 25 MG tablet Take 1 tablet (25 mg total) by mouth daily. 02/05/22   Tobb, Kardie, DO  valsartan (DIOVAN) 160 MG tablet Take 1 tablet (160 mg total) by mouth daily. 02/05/22   Tobb, Kardie, DO  LISINOPRIL-HYDROCHLOROTHIAZIDE PO Take by mouth.  07/06/19  [provider]    Physical Exam:  Vitals:   02/26/22 2214 02/26/22 2215 02/26/22 2333 02/27/22 0138  BP:  (!) 144/75 (!) 147/84 (!) 144/74  Pulse:  (!) 56 (!) 58 (!) 54  Resp:  16 16 16   Temp:  97.8 F (36.6 C) 97.6 F (36.4 C) 98.1 F (36.7 C)  TempSrc:  Oral Oral Oral  SpO2:  98% 94% 98%  Weight: (!) 173.6 kg     Height:        Constitutional: Awake alert and oriented x3, no associated distress.   Skin: no rashes, no lesions, good skin turgor noted. Eyes: Pupils are equally reactive to light.  No evidence of scleral icterus or conjunctival pallor.  ENMT: Moist mucous membranes noted.  Posterior pharynx clear of any exudate or lesions.   Neck: normal, supple, no masses, no thyromegaly.  No evidence of jugular venous distension.   Respiratory: clear to auscultation bilaterally, no wheezing, no crackles. Normal respiratory effort. No accessory muscle use.  Cardiovascular: Regular rate and rhythm, no murmurs / rubs / gallops. No extremity edema. 2+ pedal pulses. No carotid bruits.  Chest:   Nontender without crepitus or deformity.   Back:   Nontender without crepitus or deformity. Abdomen: Abdomen is soft and nontender.  No evidence of intra-abdominal masses.  Positive bowel sounds noted in all quadrants.   Musculoskeletal: No joint deformity upper and lower extremities. Good ROM, no contractures. Normal muscle tone.  Neurologic: CN 2-12 grossly intact. Sensation intact.  Patient moving all 4 extremities spontaneously.  Patient is following all commands.  Patient is  responsive to verbal stimuli.   Psychiatric: Patient exhibits normal mood with appropriate affect.  Patient seems to possess insight as to their current situation.    Data Reviewed:  I have personally reviewed and interpreted labs, imaging.  Significant findings are:  Chemistry revealing sodium 137, potassium 4.1, BUN 27, creatinine of 1.79.   CBC reveals white blood cell count 8.6, hemoglobin 14.4, hematocrit 42.9, platelet count 249.   Ethanol level less than 10.   Urinalysis unremarkable.   Urine toxicology screen negative. CT imaging of the head without contrast revealing no acute intracranial abnormality.   CT angiogram of the head and neck revealing no evidence of significant stenosis.  EKG: Personally reviewed.  Rhythm is normal sinus rhythm with heart rate of 63 bpm.  No dynamic ST segment changes appreciated.   Assessment and Plan: * Occipital headache Patient presenting with several week history of waxing and waning occipital headaches with various neurologic complaints including blurry vision, difficulty with speech and tingling Evaluated via teleneurology with Dr. Cheral Marker on 6/2 while patient was at [facility], recommending transfer to [facility]  for complete work-up including MRI brain, EEG and echocardiogram Differential diagnoses include acute stroke, idiopathic intracranial hypertension, partial complex seizures or complicated migraines Performing serial neurologic checks Monitoring patient on telemetry Initiating antiplatelet therapy including aspirin 325 mg now followed by 81 mg daily. Obtaining lipid panel Further imaging to include: MRI of brain without contrast as well as MRV. Obtaining hemoglobin A1c and lipid panel in the morning Seeing as how patient is currently neurologically at baseline I do not believe that permissive hypertension is necessary at this time. Echocardiogram in the morning with bubble study PT, OT, SLP evaluation Neurology  following in consultation.  Expressive aphasia Please see assessment and plan above  Chronic kidney disease, stage 3a (HCC) Strict intake and output monitoring Creatinine slightly higher than baseline but not high enough to suggest acute kidney injury Diuretics temporarily held. Minimizing nephrotoxic agents as much as possible Serial chemistries to monitor renal function and electrolytes   Essential hypertension Documented history of medication noncompliance however currently patient reports compliance with his home regimen of antihypertensives. Resume patients home regimen.  Per my review of last cardiology note patient is supposed to be on amlodipine 10 mg daily, Valsartan 160 mg daily, labetalol 200 mg twice daily and hydralazine 50 mg twice daily. Titrate antihypertensive regimen as necessary to achieve adequate BP control PRN intravenous antihypertensives for excessively elevated blood pressure    Chronic diastolic CHF (congestive heart failure) (HCC) No clinical evidence of cardiogenic volume overload Temporarily holding diuretics Strict input and output monitoring Daily weights Low-sodium diet   Gastroesophageal reflux disease without esophagitis I do not see an antacid on the home medication list but we will go ahead and place patient on Protonix 40 mg daily  OSA (obstructive sleep apnea) Continue home regimen of CPAP nightly  Nicotine dependence, cigarettes, uncomplicated Patient is being counseled daily on smoking cessation. Patient declining nicotine replacement therapy.        Code Status:  Full code  code status decision has been confirmed with: patient Family Communication: deferred   Consults: Neurology, seen by teleneurology at med center [facility]   Severity of Illness:  The appropriate patient status for this patient is OBSERVATION. Observation status is judged to be reasonable and necessary in order to provide the required intensity of service to  ensure the patient's safety. The patient's presenting symptoms, physical exam findings, and initial radiographic and laboratory data in the context of their medical condition is felt to place them at decreased risk for further clinical deterioration. Furthermore, it is anticipated that the patient will be medically stable for discharge from the hospital within 2 midnights of admission.   Author:  Vernelle Emerald MD  02/27/2022 3:31 AM

## 2022-02-27 NOTE — Progress Notes (Signed)
Patient admitted to room 3W07, oriented to belongings policy, call bell system, fall prevention policy. Alert and oriented. No complaints at this time.

## 2022-02-27 NOTE — Progress Notes (Signed)
Physical Therapy Note  Spoke with occupational therapy after initial evaluation. OT reports patient is functioning at a high level of independence and no physical therapy is indicated at this time. Spoke with patient personally. He reports feeling back to baseline, did not notice any abnormal symptoms while ambulating in hallways. Denies falls, dizziness, feelings of weakness or loss of balance. PT is signing-off. Please re-order if there is any significant change in status. Thank you for this referral.  Candie Mile, PT

## 2022-02-27 NOTE — Assessment & Plan Note (Addendum)
   Documented history of medication noncompliance however currently patient reports compliance with his home regimen of antihypertensives. . Resume patients home regimen.  Per my review of last cardiology note patient is supposed to be on amlodipine 10 mg daily, Valsartan 160 mg daily, labetalol 200 mg twice daily and hydralazine 50 mg twice daily. . Titrate antihypertensive regimen as necessary to achieve adequate BP control . PRN intravenous antihypertensives for excessively elevated blood pressure

## 2022-02-27 NOTE — Hospital Course (Signed)
41 year old male with past medical history of chronic diastolic congestive heart failure (Echo 09/2020 EF 60-65% with G1DD), gastroesophageal reflux disease, obstructive sleep apnea (on CPAP), nicotine dependence, obesity hypoventilation syndrome, hypertension who presented to [facility] emergency department with complaints of headaches, blurry vision and difficulty speaking for 3 weejks.

## 2022-02-27 NOTE — Discharge Summary (Signed)
Physician Discharge Summary   Patient: Calvin Bailey MRN: [id number] DOB: 12/06/1980  Admit date:     02/26/2022  Discharge date: 02/27/22  Discharge Physician: Edwin Dada   PCP: Pcp, No     Recommendations at discharge:  Follow up with Neurology as previously planned     Discharge Diagnoses: Principal Problem:   Occipital headache, suspect complex migraine, less likely IIH Active Problems:   Chronic kidney disease, stage 3a ([place])   Essential hypertension   Chronic diastolic CHF (congestive heart failure) ([place])   Gastroesophageal reflux disease without esophagitis   Nicotine dependence, cigarettes, uncomplicated   OSA (obstructive sleep apnea)   Morbid obesity ([place])   Patent foramen ovale   Hospital Course: Calvin Bailey is a 41 y.o. M with MO, dCHF, GERD, OSA, smoking, OHS, and HTN who presented with several weeks of no form of headache, associated with at times expressive aphasia, feeling "outside the body", and numbness or hand tremor.    Headaches and transient aphasia, suspect complex migraine, less likely IIH He was admitted to the hospital - MRI brain normal, partially empty sella, association with IIH noted - MRV ruled out venous sinus thrombosis - EEG negative   He was evaluated by neurology who felt that this was more likely migraines, less likely IIH.  They recommend Topamax and outpatient Neurology follow up.    Chronic diastolic CHF Patent Foramen Ovale Echo was ordered by admitted MD.  This showed normal EF, normal valves, known diastolic dysfunction and PFO.           The [facility] Controlled Substances Registry was reviewed for this patient prior to discharge.   Consultants: Neurology   Disposition: Home Diet recommendation:  Discharge Diet Orders (From admission, onward)     Start     Ordered   02/27/22 0000  Diet - low sodium heart healthy        02/27/22 1104             DISCHARGE MEDICATION: Allergies as of  02/27/2022       Reactions   Chlorthalidone Nausea Only   Lisinopril Cough        Medication List     TAKE these medications    amLODipine 10 MG tablet Commonly known as: NORVASC Take 1 tablet (10 mg total) by mouth daily.   hydrALAZINE 50 MG tablet Commonly known as: APRESOLINE Take 1.5 tablets (75 mg total) by mouth in the morning and at bedtime.   ibuprofen 200 MG tablet Commonly known as: ADVIL Take 600 mg by mouth 2 (two) times daily as needed for headache.   labetalol 200 MG tablet Commonly known as: NORMODYNE Take 1 tablet (200 mg total) by mouth 2 (two) times daily.   naproxen sodium 220 MG tablet Commonly known as: ALEVE Take 660 mg by mouth 2 (two) times daily as needed (headache).   Semaglutide-Weight Management 0.25 MG/0.5ML Soaj Inject 0.25 mg into the skin every Friday.   Semaglutide-Weight Management 0.5 MG/0.5ML Soaj Inject 0.5 mg into the skin once a week for 28 days.   Semaglutide-Weight Management 1 MG/0.5ML Soaj Inject 1 mg into the skin once a week for 28 days. Start taking on: March 27, 2022   Semaglutide-Weight Management 1.7 MG/0.75ML Soaj Inject 1.7 mg into the skin once a week for 28 days. Start taking on: April 25, 2022   Semaglutide-Weight Management 2.4 MG/0.75ML Soaj Inject 2.4 mg into the skin once a week for 28 days. Start taking on:  May 24, 2022   spironolactone 25 MG tablet Commonly known as: ALDACTONE Take 1 tablet (25 mg total) by mouth daily.   topiramate 50 MG tablet Commonly known as: Topamax Take 1 tablet (50 mg total) by mouth 2 (two) times daily.   valsartan 160 MG tablet Commonly known as: Diovan Take 1 tablet (160 mg total) by mouth daily.        Follow-up Information     Dohmeier, Asencion Partridge, MD Follow up.   Specialty: Neurology Contact information: [address] [place] [address] [phone]                 Discharge Instructions     Ambulatory referral to Neurology    Complete by: As directed    An appointment is requested in approximately: 1 week   Diet - low sodium heart healthy   Complete by: As directed    Discharge instructions   Complete by: As directed    From Dr. Loleta Books: You were evaluated for headaches, face pressure, dizziness and vision changes.  Here, you had an MRI of the brain that showed no signs of stroke or mass/tumor.  You had an EEG ("electroencephalogram", which measures the electricity of the brain) that showed no signs of seizures  Your evaluated by our neurologist, who suspects idiopathic intracranial hypertension Start Topiramate 50 mg twice daily I encourage you to read about idiopathic intracranial hypertension on Wikipedia.  You have a follow-up with Dr. Brett Fairy coming up soon (see below in the to do section) at [facility] Please call our office if you have questions about this Neurology appointment follow-up   Increase activity slowly   Complete by: As directed        Discharge Exam: Filed Weights   02/26/22 1512 02/26/22 2214  Weight: (!) 172.4 kg (!) 173.6 kg    General: Pt is alert, awake, not in acute distress Cardiovascular: RRR, nl S1-S2, no murmurs appreciated.   No LE edema.   Respiratory: Normal respiratory rate and rhythm.  CTAB without rales or wheezes. Abdominal: Abdomen soft and non-tender.  No distension or HSM.   Neuro/Psych: Strength symmetric in upper and lower extremities.  Judgment and insight appear normal, visual fields normal to opposition.   Condition at discharge: good  The results of significant diagnostics from this hospitalization (including imaging, microbiology, ancillary and laboratory) are listed below for reference.   Imaging Studies: CT ANGIO HEAD NECK W WO CM  Result Date: 02/16/2022 CLINICAL DATA:  Dizziness. EXAM: CT ANGIOGRAPHY HEAD AND NECK TECHNIQUE: Multidetector CT imaging of the head and neck was performed using the standard protocol during  bolus administration of intravenous contrast. Multiplanar CT image reconstructions and MIPs were obtained to evaluate the vascular anatomy. Carotid stenosis measurements (when applicable) are obtained utilizing NASCET criteria, using the distal internal carotid diameter as the denominator. RADIATION DOSE REDUCTION: This exam was performed according to the departmental dose-optimization program which includes automated exposure control, adjustment of the mA and/or kV according to patient size and/or use of iterative reconstruction technique. CONTRAST:  42mL OMNIPAQUE IOHEXOL 350 MG/ML SOLN COMPARISON:  CT head Feb 08, 2021. FINDINGS: CT HEAD FINDINGS Brain: No evidence of acute large vascular territory infarction, hemorrhage, hydrocephalus, extra-axial collection or mass lesion/mass effect. Partially empty sella. Vascular: Detailed below. Skull: No acute fracture. Sinuses: Clear sinuses. Orbits: No acute finding. Review of the MIP images confirms the above findings CTA NECK FINDINGS Mildly motion limited. Aortic arch: Great vessel origins are patent  without significant stenosis. Right carotid system: No evidence of dissection, stenosis (50% or greater) or occlusion. Left carotid system: No evidence of dissection, stenosis (50% or greater) or occlusion. Vertebral arteries: Codominant. No evidence of dissection, stenosis (50% or greater) or occlusion. Skeleton: No acute abnormality. Other neck: No acute abnormality. Upper chest: Visualized lung apices are clear. Review of the MIP images confirms the above findings CTA HEAD FINDINGS Nondiagnostic study due to extensive patient motion. Patient became sick during contrast administration per tech note. IMPRESSION: CT Head: 1. No evidence of acute intracranial abnormality. 2. Partially empty sella, which is often a normal anatomic variant but can be associated with idiopathic intracranial hypertension (pseudotumor cerebri). CTA head: Nondiagnostic study due to extensive  patient motion. Patient became sick during contrast administration per tech note. CTA neck: No evidence of significant (greater than 50%) stenosis. Mildly motion limited. Electronically Signed   By: Margaretha Sheffield M.D.   On: 02/16/2022 12:58   CT Head Wo Contrast  Result Date: 02/08/2022 CLINICAL DATA:  41 year old male with history of sudden onset of severe headache lasting for the past 3-4 days. EXAM: CT HEAD WITHOUT CONTRAST TECHNIQUE: Contiguous axial images were obtained from the base of the skull through the vertex without intravenous contrast. RADIATION DOSE REDUCTION: This exam was performed according to the departmental dose-optimization program which includes automated exposure control, adjustment of the mA and/or kV according to patient size and/or use of iterative reconstruction technique. COMPARISON:  Head CT 09/23/2007. FINDINGS: Brain: No evidence of acute infarction, hemorrhage, hydrocephalus, extra-axial collection or mass lesion/mass effect. Vascular: No hyperdense vessel or unexpected calcification. Skull: Normal. Negative for fracture or focal lesion. Sinuses/Orbits: No acute finding. Other: None. IMPRESSION: 1. No acute intracranial abnormalities. The appearance of the brain is normal. Electronically Signed   By: Vinnie Langton M.D.   On: 02/08/2022 09:22   MR BRAIN WO CONTRAST  Result Date: 02/27/2022 CLINICAL DATA:  41 year old male with code stroke presentation. Headache, left leg numbness, abnormal speech. EXAM: MRI HEAD WITHOUT CONTRAST TECHNIQUE: Multiplanar, multiecho pulse sequences of the brain and surrounding structures were obtained without intravenous contrast. COMPARISON:  Intracranial MRV without and with contrast reported separately today. Plain head CT 0357 hours today. FINDINGS: Brain: No restricted diffusion to suggest acute infarction. No midline shift, mass effect, evidence of mass lesion, ventriculomegaly, extra-axial collection or acute intracranial hemorrhage.  Cervicomedullary junction is within normal limits. Borderline to mild partially empty sella (series 17, image 19). Normal background cerebral volume. Pearline Cables and white matter signal is largely normal throughout the brain. No encephalomalacia identified. However, there is evidence of a chronic microhemorrhage in the left thalamus on series 14, image 27. No CT correlation there. No other obvious chronic cerebral blood products. No dural thickening or abnormal intracranial enhancement identified on the postcontrast MRV images today, detailed separately. Vascular: Major intracranial vascular flow voids are preserved. Comparing pre contrast T1 to the postcontrast MRV images today the major dural venous sinuses appear to be enhancing and patent (please see that report) Skull and upper cervical spine: Negative visible cervical spine. Visualized bone marrow signal is within normal limits. Sinuses/Orbits: Negative orbits. Trace ethmoid sinus mucosal thickening. Well aerated sinuses overall. No sinus fluid levels. Other: Visible internal auditory structures appear normal. Mastoids are clear. Negative visible scalp and face. IMPRESSION: 1. No acute intracranial abnormality. 2. Evidence of a punctate chronic microhemorrhage in the left thalamus, but otherwise normal MRI signal throughout the brain. 3. However, partially empty sella - often a normal anatomic  variant but can be associated with idiopathic intracranial hypertension (pseudotumor cerebri). Electronically Signed   By: Genevie Ann M.D.   On: 02/27/2022 06:45   EEG adult  Result Date: 02/27/2022 Lora Havens, MD     02/27/2022  9:07 AM Patient Name: Calvin Bailey MRN: [id number] Epilepsy Attending: Lora Havens Referring Physician/Provider: Vernelle Emerald, MD Date: 02/27/2022 Duration: 25.27 mins Patient history: 41 year old male presenting with a 3 week history of intermittent occipital headaches accompanied by transient expressive aphasia. His episode today was  the worst so far, being associated with left sided numbness, hand tremors, altered sensorium and a 1 hour period of "feeling off" with confusion and sensation of tiredness after his speech deficit resolved. EEG to evaluate for seizure Level of alertness: Awake AEDs during EEG study: None Technical aspects: This EEG study was done with scalp electrodes positioned according to the 10-20 International system of electrode placement. Electrical activity was acquired at a sampling rate of 500Hz  and reviewed with a high frequency filter of 70Hz  and a low frequency filter of 1Hz . EEG data were recorded continuously and digitally stored. Description: The posterior dominant rhythm consists of 10 Hz activity of moderate voltage (25-35 uV) seen predominantly in posterior head regions, symmetric and reactive to eye opening and eye closing. Physiologic photic driving was not seen during photic stimulation.  Hyperventilation was not performed.   IMPRESSION: This study is within normal limits. No seizures or epileptiform discharges were seen throughout the recording. Lora Havens   MR MRV HEAD W WO CONTRAST  Result Date: 02/27/2022 CLINICAL DATA:  41 year old male with code stroke presentation. Headache, left leg numbness, abnormal speech. EXAM: MR VENOGRAM HEAD WITHOUT AND WITH CONTRAST TECHNIQUE: Angiographic images of the intracranial venous structures were acquired using MRV technique without and with intravenous contrast. CONTRAST:  69mL GADAVIST GADOBUTROL 1 MMOL/ML IV SOLN COMPARISON:  Plain head CT 0357 hours. CTA head and neck 02/16/2022. Brain MRI today reported separately. FINDINGS: Time-of-flight and postcontrast intracranial MRV. Precontrast time-of-flight flow signal is preserved in the superior sagittal sinus, torcula, straight sinus, vein of Galen, internal cerebral veins, bilateral transverse sinuses, bilateral sigmoid sinuses, and both IJ bulbs. The right transverse and sigmoid sinuses appear mildly  dominant. Flow signal also appears preserved in the major draining cortical veins. Postcontrast images demonstrate preserved enhancement of those dural venous structures as well as the bilateral cavernous sinus. There is a mildly effaced appearance of the bilateral transverse-sigmoid sinus junctions. No abnormal enhancement identified. No dural thickening identified. Brain MRI is reported separately. IMPRESSION: 1. Negative for dural venous sinus thrombosis. 2. Mildly effaced appearance of both transverse-sigmoid venous sinus junctions, a finding which can be seen with idiopathic intracranial hypertension (pseudotumor cerebri). Electronically Signed   By: Genevie Ann M.D.   On: 02/27/2022 06:40   ECHOCARDIOGRAM COMPLETE BUBBLE STUDY  Result Date: 02/27/2022    ECHOCARDIOGRAM REPORT   Patient Name:   Calvin Bailey Date of Exam: 02/27/2022 Medical Rec #:  [id number]        Height:       73.0 in Accession #:    [id number]       Weight:       382.7 lb Date of Birth:  [birth year]        BSA:          2.835 m Patient Age:    44 years         BP:  155/92 mmHg Patient Gender: M                HR:           55 bpm. Exam Location:  Inpatient Procedure: 2D Echo, Color Doppler, Cardiac Doppler and Saline Contrast Bubble            Study Indications:    Stroke i63.9  History:        Patient has prior history of Echocardiogram examinations, most                 recent 10/23/2020. CHF; Risk Factors:Hypertension and Sleep                 Apnea.  Sonographer:    Raquel Sarna Senior RDCS Referring Phys: [id number] Sherryll Burger [facility]  Sonographer Comments: Suboptimal apical window due to body habitus, bubble study performed from apical and parasternal window IMPRESSIONS  1. Left ventricular ejection fraction, by estimation, is 60 to 65%. The left ventricle has normal function. The left ventricle has no regional wall motion abnormalities. There is moderate left ventricular hypertrophy. Left ventricular diastolic parameters are consistent  with Grade II diastolic dysfunction (pseudonormalization).  2. Right ventricular systolic function is normal. The right ventricular size is normal.  3. PFO (~0.65 cm) identified with color doppler showing left to right shunt.  4. No evidence of mitral valve regurgitation.  5. Aortic valve regurgitation is mild.  6. The inferior vena cava is normal in size with greater than 50% respiratory variability, suggesting right atrial pressure of 3 mmHg. Conclusion(s)/Recommendation(s): Recommend TEE if patient being worked up for stroke. FINDINGS  Left Ventricle: Left ventricular ejection fraction, by estimation, is 60 to 65%. The left ventricle has normal function. The left ventricle has no regional wall motion abnormalities. The left ventricular internal cavity size was normal in size. There is  moderate left ventricular hypertrophy. Left ventricular diastolic parameters are consistent with Grade II diastolic dysfunction (pseudonormalization). Right Ventricle: The right ventricular size is normal. Right ventricular systolic function is normal. Left Atrium: Left atrial size was normal in size. Right Atrium: Right atrial size was normal in size. Pericardium: There is no evidence of pericardial effusion. Mitral Valve: No evidence of mitral valve regurgitation. Tricuspid Valve: Tricuspid valve regurgitation is not demonstrated. Aortic Valve: Aortic valve regurgitation is mild. Pulmonic Valve: Pulmonic valve regurgitation is not visualized. Venous: The inferior vena cava is normal in size with greater than 50% respiratory variability, suggesting right atrial pressure of 3 mmHg. IAS/Shunts: Agitated saline contrast was given intravenously to evaluate for intracardiac shunting.  LEFT VENTRICLE PLAX 2D LVIDd:         5.40 cm   Diastology LVIDs:         3.40 cm   LV e' medial:    5.66 cm/s LV PW:         2.00 cm   LV E/e' medial:  15.5 LV IVS:        1.30 cm   LV e' lateral:   7.83 cm/s LVOT diam:     2.40 cm   LV E/e' lateral:  11.2 LV SV:         121 LV SV Index:   43 LVOT Area:     4.52 cm  RIGHT VENTRICLE RV S prime:     9.36 cm/s TAPSE (M-mode): 2.6 cm LEFT ATRIUM             Index        RIGHT ATRIUM  Index LA diam:        4.70 cm 1.66 cm/m   RA Area:     17.90 cm LA Vol (A2C):   99.7 ml 35.17 ml/m  RA Volume:   50.50 ml  17.81 ml/m LA Vol (A4C):   91.9 ml 32.42 ml/m LA Biplane Vol: 97.9 ml 34.53 ml/m  AORTIC VALVE LVOT Vmax:   116.00 cm/s LVOT Vmean:  75.600 cm/s LVOT VTI:    0.268 m  AORTA Ao Root diam: 3.50 cm Ao Asc diam:  3.80 cm MITRAL VALVE MV Area (PHT): 2.54 cm    SHUNTS MV Decel Time: 299 msec    Systemic VTI:  0.27 m MV E velocity: 88.00 cm/s  Systemic Diam: 2.40 cm MV A velocity: 83.30 cm/s MV E/A ratio:  1.06 Mary Scientist, physiological signed by Phineas Inches Signature Date/Time: 02/27/2022/1:34:27 PM    Final    CT HEAD CODE STROKE WO CONTRAST  Result Date: 02/26/2022 CLINICAL DATA:  Code stroke. Headache, speech difficulties, left leg numbness EXAM: CT HEAD WITHOUT CONTRAST TECHNIQUE: Contiguous axial images were obtained from the base of the skull through the vertex without intravenous contrast. RADIATION DOSE REDUCTION: This exam was performed according to the departmental dose-optimization program which includes automated exposure control, adjustment of the mA and/or kV according to patient size and/or use of iterative reconstruction technique. COMPARISON:  CT head 02/16/2022 FINDINGS: Brain: There is no acute intracranial hemorrhage, extra-axial fluid collection, or acute infarct. Parenchymal volume is normal. The ventricles are normal in size. Gray-white differentiation is preserved A calcified lesion overlying the left frontal lobe measuring up to 9 mm is unchanged, likely a calcified meningioma. There is no other mass lesion. There is no mass effect or midline shift. Vascular: No hyperdense vessel or unexpected calcification. Skull: Normal. Negative for fracture or focal lesion. Sinuses/Orbits:  The imaged paranasal sinuses are clear. The globes and orbits are unremarkable. Other: None. ASPECTS [facility] Stroke Program Early CT Score) - Ganglionic level infarction (caudate, lentiform nuclei, internal capsule, insula, M1-M3 cortex): 7 - Supraganglionic infarction (M4-M6 cortex): 3 Total score (0-10 with 10 being normal): 10 IMPRESSION: 1. No acute intracranial pathology. 2. ASPECTS is 10 These results were called by telephone at the time of interpretation on 02/26/2022 at 4:07 pm to provider [facility] , who verbally acknowledged these results. Electronically Signed   By: Valetta Mole M.D.   On: 02/26/2022 16:07    Microbiology: Results for orders placed or performed during the hospital encounter of 04/10/20  Blood Culture (routine x 2)     Status: None   Collection Time: 04/10/20  5:00 PM   Specimen: BLOOD  Result Value Ref Range Status   Specimen Description   Final    BLOOD RIGHT ANTECUBITAL Performed at [facility] [address] [place], [place] [address]    Special Requests   Final    BOTTLES DRAWN AEROBIC AND ANAEROBIC Blood Culture adequate volume Performed at [facility] [address] [place], [place] [address]    Culture   Final    NO GROWTH 5 DAYS Performed at [facility], [place] [address], [place], [place] [address]    Report Status 04/15/2020 FINAL  Final  Blood Culture (routine x 2)     Status: None   Collection Time: 04/10/20  5:46 PM   Specimen: BLOOD LEFT HAND  Result Value Ref Range Status   Specimen Description   Final    BLOOD LEFT HAND  Performed at [facility], [place] [address], [place], [place] [address]    Special Requests   Final    BOTTLES DRAWN AEROBIC ONLY Blood Culture adequate volume Performed at [facility] [address], [place], [place] [address]    Culture   Final    NO GROWTH 5 DAYS Performed at [facility], [place] [address] [place], [place]  [address]    Report Status 04/15/2020 FINAL  Final    Labs: CBC: Recent Labs  Lab 02/26/22 1530 02/27/22 0756  WBC 8.6 6.4  NEUTROABS 5.6 4.2  HGB 14.4 14.4  HCT 42.9 41.7  MCV 86.0 86.9  PLT 249 191   Basic Metabolic Panel: Recent Labs  Lab 02/26/22 1530 02/27/22 0756  NA 137 138  K 4.1 4.2  CL 105 108  CO2 24 23  GLUCOSE 104* 131*  BUN 27* 21*  CREATININE 1.79* 1.72*  CALCIUM 9.2 8.8*  MG  --  2.1   Liver Function Tests: Recent Labs  Lab 02/26/22 1530 02/27/22 0756  AST 21 21  ALT 26 27  ALKPHOS 59 59  BILITOT 0.4 0.8  PROT 7.6 6.6  ALBUMIN 4.5 3.9   CBG: Recent Labs  Lab 02/26/22 1535  GLUCAP 101*    Discharge time spent: approximately 25 minutes spent on discharge counseling, evaluation of patient on day of discharge, and coordination of discharge planning with nursing, social work, pharmacy and case management  Signed: Edwin Dada, MD [facility] 02/27/2022

## 2022-02-27 NOTE — Assessment & Plan Note (Signed)
   I do not see an antacid on the home medication list but we will go ahead and place patient on Protonix 40 mg daily

## 2022-02-27 NOTE — Progress Notes (Signed)
EEG complete - results pending 

## 2022-02-27 NOTE — Assessment & Plan Note (Signed)
·   Please see assessment and plan above °

## 2022-03-01 NOTE — Telephone Encounter (Signed)
Called pt to let him know his paperwork is ready and will be at the front desk for pick up. He verbalized understanding. No questions voiced at this time.

## 2022-03-01 NOTE — Telephone Encounter (Signed)
Informed patient that I will send his message to Dr. Terrial Rhodes nurse.

## 2022-03-01 NOTE — Telephone Encounter (Signed)
Patient called to check the status of FMLA forms

## 2022-03-10 NOTE — Telephone Encounter (Signed)
Patient is following up, requesting to speak with RN regarding FMLA paperwork. He states the dates completed on the paperwork were incorrect and he has a new form to be completed for his employer. Please assist.

## 2022-03-11 NOTE — Telephone Encounter (Addendum)
Called Calvin Bailey to let him know the dates written on his paperwork are the dates Dr. Harriet Masson can attest to him being seen/treated for cardiac related symptoms. Calvin Bailey advised to have Neurology or his PCP attest to the most recent hospitalization. He states he does not have a PCP and Neurology can not attest to this. Calvin Bailey has not be evaluated since leaving the hospital, due to scheduling issues at the selected neurologist. I provided the contact information for the Neurologist who seen the Calvin Bailey in the hospital to help attest for the treatment he received most recently related to his symptoms. He thanked me for calling him and will contact the office of that neurologist for further assistance.

## 2022-03-19 ENCOUNTER — Other Ambulatory Visit (HOSPITAL_COMMUNITY): Payer: Self-pay | Admitting: Neurology

## 2022-03-19 ENCOUNTER — Other Ambulatory Visit: Payer: Self-pay | Admitting: Neurology

## 2022-03-19 DIAGNOSIS — G4459 Other complicated headache syndrome: Secondary | ICD-10-CM

## 2022-03-22 ENCOUNTER — Encounter (HOSPITAL_BASED_OUTPATIENT_CLINIC_OR_DEPARTMENT_OTHER): Payer: Self-pay | Admitting: Family Medicine

## 2022-03-22 ENCOUNTER — Ambulatory Visit (INDEPENDENT_AMBULATORY_CARE_PROVIDER_SITE_OTHER): Payer: 59 | Admitting: Family Medicine

## 2022-03-22 VITALS — BP 175/94 | HR 74 | Temp 97.8°F | Ht 73.0 in | Wt 379.8 lb

## 2022-03-22 DIAGNOSIS — G4733 Obstructive sleep apnea (adult) (pediatric): Secondary | ICD-10-CM | POA: Diagnosis not present

## 2022-03-22 DIAGNOSIS — I10 Essential (primary) hypertension: Secondary | ICD-10-CM | POA: Diagnosis not present

## 2022-03-22 DIAGNOSIS — G8929 Other chronic pain: Secondary | ICD-10-CM

## 2022-03-22 DIAGNOSIS — R519 Headache, unspecified: Secondary | ICD-10-CM | POA: Diagnosis not present

## 2022-03-22 NOTE — Assessment & Plan Note (Signed)
Currently utilizing topiramate with some benefit noted.  Has upcoming evaluation with ophthalmology and neurology.  Recommend continued evaluation with specialist, continue with topiramate as prescribed Likely would also be beneficial for patient to keep headache diary to review with neurologist in order to better assess potential triggers, optimize treatment plan

## 2022-03-22 NOTE — Progress Notes (Signed)
New Patient Office Visit  Subjective    Patient ID: RAGE NABB, male    DOB: 01-Jan-1981  Age: 41 y.o. MRN: [id number]  CC:  Chief Complaint  Patient presents with   New Patient (Initial Visit)    Pt here to establish new care     HPI Calvin Bailey presents to establish care Last PCP - not sure, has been at least 8-10 years since last PCP  Accelerated HTN: Follows with cardiology. Current meds include amlodipine, hydralazine, labetalol, spironolactone, valsartan.  No recent changes medications.  Denies any chest pain.  Has been having issues with headaches, recent hospitalization related to this, see below.  Next appoint with cardiology is in about 5 weeks  Headache: Recently was admitted earlier this month for headache evaluation.  Work-up was generally reassuring.  He was started on topiramate at that time by neurology.  He does have follow-up scheduled next week with neurologist.  He also has evaluation with ophthalmologist upcoming to check for ocular sources of headache.  Currently feels that topiramate has provided some benefit, still has headaches but severity is lessened  Patient is originally from [place]. 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 Outside of work, patient enjoys traveling, spending time with family, real estate.  Outpatient Encounter Medications as of 03/22/2022  Medication Sig   amLODipine (NORVASC) 10 MG tablet Take 1 tablet (10 mg total) by mouth daily.   hydrALAZINE (APRESOLINE) 50 MG tablet Take 1.5 tablets (75 mg total) by mouth in the morning and at bedtime.   ibuprofen (ADVIL) 200 MG tablet Take 600 mg by mouth 2 (two) times daily as needed for headache.   labetalol (NORMODYNE) 200 MG tablet Take 1 tablet (200 mg total) by mouth 2 (two) times daily.   naproxen sodium (ALEVE) 220 MG tablet Take 660 mg by mouth 2 (two) times daily as needed (headache).   Semaglutide-Weight Management 0.25 MG/0.5ML SOAJ Inject 0.25 mg into the  skin every Friday.   Semaglutide-Weight Management 0.5 MG/0.5ML SOAJ Inject 0.5 mg into the skin once a week for 28 days.   [START ON 03/27/2022] Semaglutide-Weight Management 1 MG/0.5ML SOAJ Inject 1 mg into the skin once a week for 28 days.   [START ON 04/25/2022] Semaglutide-Weight Management 1.7 MG/0.75ML SOAJ Inject 1.7 mg into the skin once a week for 28 days.   [START ON 05/24/2022] Semaglutide-Weight Management 2.4 MG/0.75ML SOAJ Inject 2.4 mg into the skin once a week for 28 days.   spironolactone (ALDACTONE) 25 MG tablet Take 1 tablet (25 mg total) by mouth daily.   topiramate (TOPAMAX) 50 MG tablet Take 1 tablet (50 mg total) by mouth 2 (two) times daily.   valsartan (DIOVAN) 160 MG tablet Take 1 tablet (160 mg total) by mouth daily.   [DISCONTINUED] LISINOPRIL-HYDROCHLOROTHIAZIDE PO Take by mouth.   No facility-administered encounter medications on file as of 03/22/2022.    Past Medical History:  Diagnosis Date   Dizziness 09/25/2019   GERD (gastroesophageal reflux disease)    HTN (hypertension)    Obesity hypoventilation syndrome (HCC) 04/08/2014   OSA (obstructive sleep apnea) 04/08/2014   PFO (patent foramen ovale)    Sleep apnea     Past Surgical History:  Procedure Laterality Date   two knee surgeries Left 1995    Family History  Problem Relation Age of Onset   Hypertension Mother    Multiple sclerosis Mother    Hypertension Father    Hypertension Brother  Hyperlipidemia Paternal Grandfather    Heart failure Paternal Grandfather    Diabetes Maternal Grandmother    Heart failure Maternal Grandfather    CVA Maternal Uncle     Social History   Socioeconomic History   Marital status: Single    Spouse name: Not on file   Number of children: 1   Years of education: college   Highest education level: Not on file  Occupational History   Occupation: unemployment    Employer: UNEMPLOYED    Employer: [employer]  Tobacco Use   Smoking status: Every Day     Packs/day: 1.00    Years: 16.00    Total pack years: 16.00    Types: Cigarettes   Smokeless tobacco: Never  Substance and Sexual Activity   Alcohol use: Yes    Alcohol/week: 0.0 standard drinks of alcohol    Comment: sparingly    Drug use: No   Sexual activity: Yes    Partners: Female  Other Topics Concern   Not on file  Social History Narrative   Corporate investment banker   Patient is single and lives alone.   Patient has one child.   Patient works at [employer].   Patient has a college education.   Patient is left-handed.   Patient does not drink any caffeine.   Social Determinants of Health   Financial Resource Strain: Not on file  Food Insecurity: Not on file  Transportation Needs: Not on file  Physical Activity: Not on file  Stress: Not on file  Social Connections: Not on file  Intimate Partner Violence: Not on file    Objective    BP (!) 175/94   Pulse 74   Temp 97.8 F (36.6 C) (Oral)   Ht 6\' 1"  (1.854 m)   Wt (!) 379 lb 12.8 oz (172.3 kg)   SpO2 99%   BMI 50.11 kg/m   Physical Exam  41 year old male in no acute distress Cardiovascular exam regular rate and rhythm Lungs clear to auscultation bilaterally  Assessment & Plan:   Problem List Items Addressed This Visit       Cardiovascular and Mediastinum   Essential hypertension - Primary (Chronic)    Blood pressure is elevated in the office today, he continues with cardiology regarding management.  Also discussed lifestyle modifications, recommendations for DASH diet Recommend close follow-up with cardiology given continued elevations Handout provided today covering DASH diet        Respiratory   OSA (obstructive sleep apnea) (Chronic)    Noted on chart review, indicates that he does use CPAP, feels that he still gets relief from this, however did provide more relief in the past.  May need to consider evaluation with sleep medicine specialist for further review of sleep apnea, CPAP settings,  effectiveness of treatment        Other   Chronic headaches    Currently utilizing topiramate with some benefit noted.  Has upcoming evaluation with ophthalmology and neurology.  Recommend continued evaluation with specialist, continue with topiramate as prescribed Likely would also be beneficial for patient to keep headache diary to review with neurologist in order to better assess potential triggers, optimize treatment plan       Return in about 2 months (around 05/22/2022) for CPE.   Lucciano Vitali J [place], MD

## 2022-03-22 NOTE — Assessment & Plan Note (Signed)
Blood pressure is elevated in the office today, he continues with cardiology regarding management.  Also discussed lifestyle modifications, recommendations for DASH diet Recommend close follow-up with cardiology given continued elevations Handout provided today covering DASH diet

## 2022-03-29 ENCOUNTER — Encounter (HOSPITAL_COMMUNITY): Payer: Self-pay

## 2022-03-29 ENCOUNTER — Ambulatory Visit (HOSPITAL_COMMUNITY): Admission: RE | Admit: 2022-03-29 | Payer: 59 | Source: Ambulatory Visit

## 2022-03-30 ENCOUNTER — Encounter: Payer: Self-pay | Admitting: Neurology

## 2022-03-31 ENCOUNTER — Ambulatory Visit: Payer: 59 | Admitting: Neurology

## 2022-03-31 ENCOUNTER — Encounter: Payer: Self-pay | Admitting: Neurology

## 2022-03-31 VITALS — BP 161/95 | HR 72 | Ht 73.0 in | Wt 375.0 lb

## 2022-03-31 DIAGNOSIS — Z9989 Dependence on other enabling machines and devices: Secondary | ICD-10-CM | POA: Diagnosis not present

## 2022-03-31 DIAGNOSIS — N184 Chronic kidney disease, stage 4 (severe): Secondary | ICD-10-CM

## 2022-03-31 DIAGNOSIS — I10 Essential (primary) hypertension: Secondary | ICD-10-CM | POA: Diagnosis not present

## 2022-03-31 DIAGNOSIS — G43011 Migraine without aura, intractable, with status migrainosus: Secondary | ICD-10-CM | POA: Insufficient documentation

## 2022-03-31 DIAGNOSIS — N189 Chronic kidney disease, unspecified: Secondary | ICD-10-CM | POA: Insufficient documentation

## 2022-03-31 DIAGNOSIS — I119 Hypertensive heart disease without heart failure: Secondary | ICD-10-CM

## 2022-03-31 MED ORDER — UBRELVY 100 MG PO TABS: ORAL_TABLET | ORAL | 2 refills | Status: DC

## 2022-03-31 MED ORDER — CYCLOBENZAPRINE HCL 10 MG PO TABS: ORAL_TABLET | ORAL | 1 refills | Status: DC

## 2022-03-31 MED ORDER — TOPIRAMATE 50 MG PO TABS: 50.0000 mg | ORAL_TABLET | Freq: Every day | ORAL | 2 refills | Status: DC

## 2022-03-31 NOTE — Addendum Note (Signed)
Addended by: Larey Seat on: 03/31/2022 12:27 PM   Modules accepted: Orders

## 2022-03-31 NOTE — Progress Notes (Addendum)
SLEEP MEDICINE CLINIC    Provider:  Larey Seat, MD  Primary Care Physician:  [place], Blondell Reveal, MD [place] [address]     Referring Provider: Edwin Dada, Md [place],  [place] [id number]          Chief Complaint according to patient   Patient presents with:     New Patient (Initial Visit)           HISTORY OF PRESENT ILLNESS:  Calvin Bailey is a 41 y.o. year old [place] American male patient seen here as a referral on 03/31/2022 from [facility]. The patient is presenting with probably complex Migraines. .  Chief concern according to patient :  " ED referral for dizziness, Headache, left leg numbness, abnormal speech.    Pt continues to have daily HA. HA is all over his head and back of eye. Was seen by the eye doctor last week. Normal work up. No pressure. Currently taking topiramate, advil, aleve, which are helping. Gets blurred vision and dizziness if HA are bad. He has poorly controlled HTN, he remains morbidly obese , BMI 49.5. He has often had BP reading of 716 mmHg systolic. Ophthalmologist last week confirmed there was no pressure on the retine, discs were flat.       Calvin Bailey  presents with a headache starting in the nape of the neck, radiating to forwards , left temple, behind the eyes, can be on both sides.  Rarely nauseated with HA,  he feels blurred vision last a couple of minutes- he he feels as if his pupils were dilated, the same kind of visual blurring and visual inaccuracy.  He has a past medical history of: Chronic migraine,  CKD 3,  diastolic CHF, no DM- repeated Dizziness (09/25/2019) and recent hospitalization June 2023, GERD (gastroesophageal reflux disease),  critical HTN (hypertension), Obesity hypoventilation syndrome ([place]) (04/08/2014), OSA on CPAP (obstructive sleep apnea) (04/08/2014), PFO (patent foramen ovale), and Morbid obesity class 3.      The patient had the first sleep study in the year 2015,  and reports he uses CPAP every night, just got a new CPAP 3 months ago. No repeat sleep test since 2015. [facility] is his DME.    Sleep relevant medical history: Nocturia 1-2, PFO, Headaches arise during the day, not in AM and he reports  not being woken by Headaches.     Family medical /sleep history: No other family member on CPAP with OSA, insomnia, sleep walkers.    Social history:  Patient is working as Programme researcher, broadcasting/film/video, [employer].  and lives in a household with alone. 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  Tobacco use; still using .   ETOH use ; seldomly,  Caffeine intake in form of Coffee( /) Soda( /) Tea ( /) or energy drinks. Regular exercise in form of ; none .        Sleep habits are as follows: The patient's dinner time is between 6-6.30 PM. All other mealtimes vary- The patient goes to bed at 9-11 PM and continues to sleep for 6 hours, wakes for one bathroom breaks, the first time at 2 AM.   The preferred sleep position is , with the support of 2-3 pillows.  He wakes with neck pain- and congested  nasal airway.  Naps are taken infrequently, and these  help his headaches-    Review of Systems: Out of a complete 14 system review, the patient complains of only the following symptoms, and all other reviewed systems are negative.:    Pain in neck, temples, and behind both eyes, but no papilledema is present.   Uncontrolled hypertension.   Blurred vision.     How likely are you to doze in the following situations: 0 = not likely, 1 = slight chance, 2 = moderate chance, 3 = high chance   Sitting and Reading? Watching Television? Sitting inactive in a public place (theater or meeting)? As a passenger in a car for an hour without a break? Lying down in the afternoon when circumstances permit? Sitting and talking to someone? Sitting quietly after  lunch without alcohol? In a car, while stopped for a few minutes in traffic?   Total = NA/ 24 points   FSS endorsed at NA/ 63 points.   Social History   Socioeconomic History   Marital status: Single    Spouse name: Not on file   Number of children: 2   Years of education: college   Highest education level: Some college, no degree  Occupational History   Occupation: unemployment    Fish farm manager: UNEMPLOYED    Employer: [employer]  Tobacco Use   Smoking status: Every Day    Packs/day: 1.00    Years: 16.00    Total pack years: 16.00    Types: Cigarettes   Smokeless tobacco: Never  Vaping Use   Vaping Use: Never used  Substance and Sexual Activity   Alcohol use: Yes    Alcohol/week: 0.0 standard drinks of alcohol    Comment: sparingly    Drug use: No   Sexual activity: Yes    Partners: Female  Other Topics Concern   Not on file  Social History Narrative   Nature conservation officer   Patient is single and lives alone.   Patient is left-handed.   Patient does not drink any caffeine.   Social Determinants of Health   Financial Resource Strain: Not on file  Food Insecurity: Not on file  Transportation Needs: Not on file  Physical Activity: Not on file  Stress: Not on file  Social Connections: Not on file    Family History  Problem Relation Age of Onset   Hypertension Mother    Multiple sclerosis Mother    Hypertension Father    Hypertension Brother    Hyperlipidemia Paternal Grandfather    Heart failure Paternal Grandfather    Diabetes Maternal Grandmother    Heart failure Maternal Grandfather    CVA Maternal Uncle     Past Medical History:  Diagnosis Date   Dizziness 09/25/2019   GERD (gastroesophageal reflux disease)    HTN (hypertension)    Obesity hypoventilation syndrome (HCC) 04/08/2014   OSA (obstructive sleep apnea) 04/08/2014   PFO (patent foramen ovale)    Sleep apnea     Past Surgical History:  Procedure Laterality Date   two knee surgeries  Left 1995     Current Outpatient Medications on File Prior to Visit  Medication Sig Dispense Refill   amLODipine (NORVASC) 10 MG tablet Take 1 tablet (10 mg total) by mouth daily. 90 tablet 3   hydrALAZINE (APRESOLINE) 50 MG tablet Take 1.5 tablets (75 mg total) by mouth in the morning and at bedtime. 270 tablet 3   ibuprofen (ADVIL) 200 MG tablet Take 600 mg by mouth 2 (two) times daily as needed for headache.  labetalol (NORMODYNE) 200 MG tablet Take 1 tablet (200 mg total) by mouth 2 (two) times daily. 180 tablet 3   naproxen sodium (ALEVE) 220 MG tablet Take 660 mg by mouth 2 (two) times daily as needed (headache).     Semaglutide-Weight Management 0.25 MG/0.5ML SOAJ Inject 0.25 mg into the skin every Friday.     Semaglutide-Weight Management 1 MG/0.5ML SOAJ Inject 1 mg into the skin once a week for 28 days. 2 mL 0   [START ON 04/25/2022] Semaglutide-Weight Management 1.7 MG/0.75ML SOAJ Inject 1.7 mg into the skin once a week for 28 days. 3 mL 0   [START ON 05/24/2022] Semaglutide-Weight Management 2.4 MG/0.75ML SOAJ Inject 2.4 mg into the skin once a week for 28 days. 3 mL 0   spironolactone (ALDACTONE) 25 MG tablet Take 1 tablet (25 mg total) by mouth daily. 90 tablet 3   topiramate (TOPAMAX) 50 MG tablet Take 1 tablet (50 mg total) by mouth 2 (two) times daily. 60 tablet 2   valsartan (DIOVAN) 160 MG tablet Take 1 tablet (160 mg total) by mouth daily. 90 tablet 3   [DISCONTINUED] LISINOPRIL-HYDROCHLOROTHIAZIDE PO Take by mouth.     No current facility-administered medications on file prior to visit.    Allergies  Allergen Reactions   Chlorthalidone Nausea Only   Lisinopril Cough    Physical exam:  Today's Vitals   03/31/22 1018  BP: (!) 161/95  Pulse: 72  Weight: (!) 375 lb (170.1 kg)  Height: 6\' 1"  (1.854 m)   Body mass index is 49.48 kg/m.   Wt Readings from Last 3 Encounters:  03/31/22 (!) 375 lb (170.1 kg)  03/22/22 (!) 379 lb 12.8 oz (172.3 kg)  02/26/22 (!)  382 lb 11.5 oz (173.6 kg)     Ht Readings from Last 3 Encounters:  03/31/22 6\' 1"  (1.854 m)  03/22/22 6\' 1"  (1.854 m)  02/26/22 6\' 1"  (1.854 m)      General: The patient is awake, alert and appears not in acute distress. The patient is well groomed. Head: Normocephalic, atraumatic. Neck is supple. Mallampati 3 plus ,  neck circumference:21.5 inches . Nasal airflow barely patent.  Retrognathia is present  Dental status: biological  Cardiovascular:  Regular rate and cardiac rhythm by pulse,  without distended neck veins. Respiratory: Lungs are clear to auscultation.  Skin:  With evidence of ankle edema. Trunk: The patient's posture is erect.   Neurologic exam : The patient is awake and alert, oriented to place and time.   Memory subjective described as intact.  Attention span & concentration ability appears normal.  Speech is fluent,  without  dysarthria, dysphonia or aphasia.  Mood and affect are appropriate.   Cranial nerves: no loss of smell or taste reported  Pupils are equal and briskly reactive to light. Funduscopic exam ; no edema. .  Extraocular movements in vertical and horizontal planes were intact and without nystagmus. No Diplopia. Visual fields by finger perimetry are intact. Hearing was intact to soft voice and finger rubbing.    Facial sensation intact to fine touch.  Facial motor strength is symmetric and tongue and uvula move midline.  Neck ROM : rotation, tilt and flexion extension were normal for age and shoulder shrug was symmetrical.    Motor exam:  Symmetric bulk, tone and ROM.   Normal tone without cog wheeling, symmetric grip strength .   Sensory: vibration was absent over ankles and feet- but there is massive edema. Proprioception tested in the upper  extremities was normal.   Coordination: Rapid alternating movements in the fingers/hands were of normal speed.  The Finger-to-nose maneuver was intact without evidence of ataxia, dysmetria or tremor.    Gait and station: he could rise unassisted from a seated position, walked without assistive device.  Toe and heel walk were deferred.  Deep tendon reflexes: in the  upper and lower extremities are symmetric and intact.  Babinski response was deferred.    The patient was recently hospitalized and presented to the emergency room with headaches and transient aphasia so this could well be a vascular complex migraine.  He also underwent an echocardiogram since he has a patent foreman ovale and he is a echo showed a normal ejection fraction normal valves normal diastolic dysfunction and PFO.  The patient had been started on semaglutide for weight management but states that he was unable to follow through once his headaches became all consuming.  He had an MRI of the brain which we reviewed here today as well as an MR venogram these were normal images also Dr. Nevada Crane suspected that it could be a pseudotumor cerebri present and in patients with obesity this is definitely a possibility.  He did not have any strokes.  He is still smoking at this time and it would help him if he could quit.  He is not sure that there are any associated exacerbating factors alleviating factors but physical activity sometimes makes the headaches worse.  I would like to start him on topiramate but he is already taking topiramate which is a migraine treatment.  It is also a seizure medicine.  He is on hydralazine and this is a prescription twice a day as I see his blood pressure here today I think he may need to increase to 3 times a day.  He is also on labetalol Normodyne 200 mg twice daily and on spironolactone once daily on valsartan 160 mg once daily.      After spending a total time of  72  minutes face to face and additional time for physical and neurologic examination, review of laboratory studies,  personal review of imaging studies, reports and results of other testing and review of referral information / records as far as  provided in visit, I have established the following assessments:  1) he is suffering from chronic headaches, arising from the neck, associated with nasal congestion and retro-orbital pressure, atypical , but possible complex migraines. I am leaning towards headaches that could be related to has incomplete controlled hypertension. There could be a mix of tension headaches and vascular headaches. Given his other disorders, TRPITANS are not a good choice for him.  He takes ibuprofen, tylenol, NSAIDS- could be analgesic rebound headaches. He reports trouble finding words, could be topiramate related ? Marland Kitchen   2) Calvin Bailey was seen here twice in the year 2015 when was evaluated by an in lab sleep study for sleep apnea, tested positive for OHV and OSA,  was started on CPAP and has been according to his report a compliant user of CPAP ever since.    He told me that he went to a store in [facility] and bought a CPAP for him after he had forgotten his at home.  So he would be considered CPAP dependent but I do not know how much of his apnea is controlled, if there is still hypoxia present, and there are some other medical diagnoses listed in epic that he is unaware of including congestive heart failure  and chronic kidney disease stage IIIa.   3) per ophthalmology - no pressure seen on optic nerve, flat disc.  Confirmed by MRI review. His orbits show no impression, He has a spinal tap ordered for 03-29-2022, but he didn't go.    My Plan is to proceed with:  1)I need a baseline sleep study- he hasn't been evaluated for 8 years- he bought his machine in [place].  I like to see a download. Addendum :  I just was able to get a download:  He is followed in [place] by [facility], his data for the last 30 days shows 97% compliance by days and 83% compliance by hours with an average of 5 hours 32 minutes.  CPAP is set at 16 cm water pressure with 2 cm EPR his residual AHI is 2.7 nearly all residuals are obstructive  apneas. He does have a moderate high air leakage.  It may be worthwhile increasing his set pressure to capture the residual apneas.  I would like to do a home sleep test with him to see how much hypoxia there is at baseline and then follow recent ONO while on CPAP to see if there is residual hypoxia under CPAP therapy.  Alternatively, we can get a SPLIT study with the same intention, establishing a baseline and starting 16 cm water CPAP to check for residual hypoxia. U[employer] is not likely to permit this in-laboratory test, however.   2) he cannot use Triptans, due to  stroke risk, cardiac risk factors and HTN. Topiramate may cause some word finding too. He is already on betablocker. He cant use depakote with a fatty Liver.   I will start Ubrelvy. Tricyclic meds for tension component; I wrote for generic Flexaril at night time 10 mg.Marland Kitchen 3) creatinine is elevated.  GFR 50/ Can we get that measured by different test, cystatin C in relation to body surface- is that done at [facility]?  He was dehydrated, too, given BUN was 20.  4)  topiramate. 50 mg, reduced to one a day. 5) stop NSAIDS . Drink water.    I would like to thank [place], Blondell Reveal, MD and Edwin Dada, Md [place],  [place] [id number] for allowing me to meet with and to take care of this pleasant patient.   In short, Calvin Bailey is presenting with complex migraines, cervicalgia, retro-orbital pressure., CPAP dependence- OHV.  , I plan to follow up either personally or through our NP within 2 months   CC: I will share my notes with - PCP and cardiology.  Electronically signed by: Larey Seat, MD 03/31/2022 11:07 AM  [facility] Board certified by The [employer] of Sleep Medicine and Diplomate of the [employer] of Sleep Medicine. Board certified In Neurology through the [place], Fellow of the [employer] of Neurology. Medical Director of [employer].

## 2022-03-31 NOTE — Patient Instructions (Addendum)
The patient was recently hospitalized and presented to the emergency room with headaches and transient aphasia so this could well be a vascular complex migraine.  He also underwent an echocardiogram since he has a patent foreman ovale and he is a echo showed a normal ejection fraction normal valves normal diastolic dysfunction and PFO.  The patient had been started on semaglutide for weight management but states that he was unable to follow through once his headaches became all consuming.  He had an MRI of the brain which we reviewed here today as well as an MR venogram these were normal images also Dr. [place] Crane suspected that it could be a pseudotumor cerebri present and in patients with obesity this is definitely a possibility.  He did not have any strokes.  He is still smoking at this time and it would help him if he could quit.  He is not sure that there are any associated exacerbating factors alleviating factors but physical activity sometimes makes the headaches worse.  I would like to start him on topiramate but he is already taking topiramate which is a migraine treatment.  It is also a seizure medicine.  He is on hydralazine and this is a prescription twice a day as I see his blood pressure here today I think he may need to increase to 3 times a day.  He is also on labetalol Normodyne 200 mg twice daily and on spironolactone once daily on valsartan 160 mg once daily.      After spending a total time of  60  minutes face to face and additional time for physical and neurologic examination, review of laboratory studies,  personal review of imaging studies, reports and results of other testing and review of referral information / records as far as provided in visit, I have established the following assessments:  1) he is suffering from chronic headaches, arising from the neck, associated with nasal congestion and retro-orbital pressure, atypical , but possible complex migraines. I am leaning towards  headaches that could be related to has incomplete controlled hypertension. There could be a mix of tension headaches and vascular headaches. 2) Mr. Mcilhenny was seen here twice in the year 2015 was evaluated by an in lab sleep study for sleep apnea, tested positive was started on CPAP and has been according to his report a compliant user of CPAP ever since.  He told me that he went to a store in [facility] and bought a CPAP for him after he had forgotten his at home.  So he would be considered CPAP dependent but I do not know how much of his apnea is controlled, if there is still hypoxia present, and there are some other medical diagnoses listed in epic that he is unaware of including congestive heart failure and chronic kidney disease stage IIIa.   2 b) He takes ibuprofen, tylenol, NSAIDS- could be analgesic rebound headaches.   He reports trouble finding words, could be topiramate related ? .  3) per ophthalmology - now pressure on optic nerve, flat disc.    My Plan is to proceed with:  1)I need a baseline sleep study- he hasn't been evaluated for 8 years- he bought his machine in [place].  I like to see a download.  2) he cannot use Triptans, due to  stroke risk, cardiac risk factors and HTN. Topiramate may cause some word finding too.  I will start Ubrelvy. Tricyclic meds for tension. Flexaril at night time 10 mg.Marland Kitchen  3) creatinine is elevated.  GFR 50/ Can we get that measured by different test. He was dehydrated, too, given BUN was 20.  4)  topiramate. 50 mg, reduced to one a day. 5) stop NSAIDS . Drink water.    I would like to thank [place], Blondell Reveal, MD and Edwin Dada, Md [address] [place],  [place] [id number] for allowing me to meet with and to take care of this pleasant patient.   In short, Calvin Bailey is presenting with complex migraines, cervicalgia, retro-orbital pressure., CPAP dependence- OHV.  , I plan to follow up either personally or through our NP  within 2 months   CC: I will share my notes with - PCP and cardiology.  Electronically signed by: Larey Seat, MD 03/31/2022 11:07 AM  [facility] Board certified by The [employer] of Sleep Medicine and Diplomate of the [employer] of Sleep Medicine. Board certified In Neurology through the [place], Fellow of the [employer] of Neurology. Medical Director of [employer]. Cyclobenzaprine Tablets What is this medication? CYCLOBENZAPRINE (sye kloe BEN za preen) treats muscle spasms. It works by relaxing your muscles, which reduces muscle stiffness. It belongs to a group of medications called muscle relaxants. This medicine may be used for other purposes; ask your health care provider or pharmacist if you have questions. COMMON BRAND NAME(S): Fexmid, Flexeril What should I tell my care team before I take this medication? They need to know if you have any of these conditions: Heart disease, irregular heartbeat, or previous heart attack Liver disease Thyroid problem An unusual or allergic reaction to cyclobenzaprine, tricyclic antidepressants, lactose, other medications, foods, dyes, or preservatives Pregnant or trying to get pregnant Breast-feeding How should I use this medication? Take this medication by mouth with a glass of water. Follow the directions on the prescription label. If this medication upsets your stomach, take it with food or milk. Take your medication at regular intervals. Do not take it more often than directed. Talk to your care team about the use of this medication in children. Special care may be needed. Overdosage: If you think you have taken too much of this medicine contact a poison control center or emergency room at once. NOTE: This medicine is only for you. Do not share this medicine with others. What if I miss a dose? If you miss a dose, take it as soon as you can. If it is almost time for your next dose, take  only that dose. Do not take double or extra doses. What may interact with this medication? Do not take this medication with any of the following: MAOIs like Carbex, Eldepryl, Marplan, Nardil, and Parnate Narcotic medications for cough Safinamide This medication may also interact with the following: Alcohol Bupropion Antihistamines for allergy, cough and cold Certain medications for anxiety or sleep Certain medications for bladder problems like oxybutynin, tolterodine Certain medications for depression like amitriptyline, fluoxetine, sertraline Certain medications for Parkinson's disease like benztropine, trihexyphenidyl Certain medications for seizures like phenobarbital, primidone Certain medications for stomach problems like dicyclomine, hyoscyamine Certain medications for travel sickness like scopolamine General anesthetics like halothane, isoflurane, methoxyflurane, propofol Ipratropium Local anesthetics like lidocaine, pramoxine, tetracaine Medications that relax muscles for surgery Narcotic medications for pain Phenothiazines like chlorpromazine, mesoridazine, prochlorperazine, thioridazine Verapamil This list may not describe all possible interactions. Give your health care provider a list of all the medicines, herbs, non-prescription drugs, or dietary supplements you use. Also tell  them if you smoke, drink alcohol, or use illegal drugs. Some items may interact with your medicine. What should I watch for while using this medication? Tell your care team if your symptoms do not start to get better or if they get worse. You may get drowsy or dizzy. Do not drive, use machinery, or do anything that needs mental alertness until you know how this medication affects you. Do not stand or sit up quickly, especially if you are an older patient. This reduces the risk of dizzy or fainting spells. Alcohol may interfere with the effect of this medication. Avoid alcoholic drinks. If you are taking  another medication that also causes drowsiness, you may have more side effects. Give your care team a list of all medications you use. Your care team will tell you how much medication to take. Do not take more medication than directed. Call emergency for help if you have problems breathing or unusual sleepiness. Your mouth may get dry. Chewing sugarless gum or sucking hard candy, and drinking plenty of water may help. Contact your care team if the problem does not go away or is severe. What side effects may I notice from receiving this medication? Side effects that you should report to your care team as soon as possible: Allergic reactions--skin rash, itching, hives, swelling of the face, lips, tongue, or throat CNS depression--slow or shallow breathing, shortness of breath, feeling faint, dizziness, confusion, trouble staying awake Heart rhythm changes--fast or irregular heartbeat, dizziness, feeling faint or lightheaded, chest pain, trouble breathing Side effects that usually do not require medical attention (report to your care team if they continue or are bothersome): Constipation Dizziness Drowsiness Dry mouth Fatigue Nausea This list may not describe all possible side effects. Call your doctor for medical advice about side effects. You may report side effects to FDA at 1-800-FDA-1088. Where should I keep my medication? Keep out of the reach of children. Store at room temperature between 15 and 30 degrees C (59 and 86 degrees F). Keep container tightly closed. Throw away any unused medication after the expiration date. NOTE: This sheet is a summary. It may not cover all possible information. If you have questions about this medicine, talk to your doctor, pharmacist, or health care provider.  2023 Elsevier/Gold Standard (2020-12-25 00:00:00) Ubrogepant Tablets What is this medication? UBROGEPANT (ue BROE je pant) treats migraines. It works by blocking a substance in the body that causes  migraines. It is not used to prevent migraines. This medicine may be used for other purposes; ask your health care provider or pharmacist if you have questions. COMMON BRAND NAME(S): Roselyn Meier What should I tell my care team before I take this medication? They need to know if you have any of these conditions: Kidney disease Liver disease An unusual or allergic reaction to ubrogepant, other medications, foods, dyes, or preservatives Pregnant or trying to get pregnant Breast-feeding How should I use this medication? Take this medication by mouth with a glass of water. Take it as directed on the prescription label. You can take it with or without food. If it upsets your stomach, take it with food. Keep taking it unless your care team tells you to stop. Talk to your care team about the use of this medication in children. Special care may be needed. Overdosage: If you think you have taken too much of this medicine contact a poison control center or emergency room at once. NOTE: This medicine is only for you. Do not share this medicine  with others. What if I miss a dose? This does not apply. This medication is not for regular use. What may interact with this medication? Do not take this medication with any of the following: Adagrasib Ceritinib Certain antibiotics, such as chloramphenicol, clarithromycin, telithromycin Certain antivirals for HIV, such as atazanavir, cobicistat, darunavir, delavirdine, fosamprenavir, indinavir, ritonavir Certain medications for fungal infections, such as itraconazole, ketoconazole, posaconazole, voriconazole Conivaptan Grapefruit Idelalisib Mifepristone Nefazodone Ribociclib This medication may also interact with the following: Carvedilol Certain medications for seizures, such as phenobarbital, phenytoin Ciprofloxacin Cyclosporine Eltrombopag Fluconazole Fluvoxamine Quinidine Rifampin St. John's wort Verapamil This list may not describe all possible  interactions. Give your health care provider a list of all the medicines, herbs, non-prescription drugs, or dietary supplements you use. Also tell them if you smoke, drink alcohol, or use illegal drugs. Some items may interact with your medicine. What should I watch for while using this medication? Visit your care team for regular checks on your progress. Tell your care team if your symptoms do not start to get better or if they get worse. Your mouth may get dry. Chewing sugarless gum or sucking hard candy and drinking plenty of water may help. Contact your care team if the problem does not go away or is severe. What side effects may I notice from receiving this medication? Side effects that you should report to your care team as soon as possible: Allergic reactions--skin rash, itching, hives, swelling of the face, lips, tongue, or throat Side effects that usually do not require medical attention (report to your care team if they continue or are bothersome): Drowsiness Dry mouth Fatigue Nausea This list may not describe all possible side effects. Call your doctor for medical advice about side effects. You may report side effects to FDA at 1-800-FDA-1088. Where should I keep my medication? Keep out of the reach of children and pets. Store between 15 and 30 degrees C (59 and 86 degrees F). Get rid of any unused medication after the expiration date. To get rid of medications that are no longer needed or have expired: Take the medication to a medication take-back program. Check with your pharmacy or law enforcement to find a location. If you cannot return the medication, check the label or package insert to see if the medication should be thrown out in the garbage or flushed down the toilet. If you are not sure, ask your care team. If it is safe to put it in the trash, pour the medication out of the container. Mix the medication with cat litter, dirt, coffee grounds, or other unwanted substance. Seal  the mixture in a bag or container. Put it in the trash. NOTE: This sheet is a summary. It may not cover all possible information. If you have questions about this medicine, talk to your doctor, pharmacist, or health care provider.  2023 Elsevier/Gold Standard (2021-11-06 00:00:00)

## 2022-03-31 NOTE — Addendum Note (Signed)
Addended by: Larey Seat on: 03/31/2022 12:23 PM   Modules accepted: Orders, Level of Service

## 2022-04-01 ENCOUNTER — Telehealth: Payer: Self-pay | Admitting: Neurology

## 2022-04-01 ENCOUNTER — Encounter (HOSPITAL_BASED_OUTPATIENT_CLINIC_OR_DEPARTMENT_OTHER): Payer: Self-pay

## 2022-04-01 ENCOUNTER — Encounter: Payer: Self-pay | Admitting: Neurology

## 2022-04-01 ENCOUNTER — Other Ambulatory Visit (HOSPITAL_BASED_OUTPATIENT_CLINIC_OR_DEPARTMENT_OTHER): Payer: Self-pay

## 2022-04-01 ENCOUNTER — Telehealth: Payer: Self-pay | Admitting: Cardiology

## 2022-04-01 ENCOUNTER — Other Ambulatory Visit: Payer: Self-pay | Admitting: Neurology

## 2022-04-01 DIAGNOSIS — E662 Morbid (severe) obesity with alveolar hypoventilation: Secondary | ICD-10-CM

## 2022-04-01 MED ORDER — SEMAGLUTIDE-WEIGHT MANAGEMENT 2.4 MG/0.75ML ~~LOC~~ SOAJ
2.4000 mg | SUBCUTANEOUS | 0 refills | Status: AC
  Filled 2022-04-01 – 2022-10-18 (×2): qty 3, 28d supply, fill #0

## 2022-04-01 MED ORDER — SEMAGLUTIDE-WEIGHT MANAGEMENT 1 MG/0.5ML ~~LOC~~ SOAJ
1.0000 mg | SUBCUTANEOUS | 0 refills | Status: AC
  Filled 2022-04-01: qty 2, 28d supply, fill #0

## 2022-04-01 MED ORDER — UBRELVY 100 MG PO TABS: 100.0000 mg | ORAL_TABLET | ORAL | 5 refills | Status: DC | PRN

## 2022-04-01 MED ORDER — SEMAGLUTIDE-WEIGHT MANAGEMENT 1.7 MG/0.75ML ~~LOC~~ SOAJ
1.7000 mg | SUBCUTANEOUS | 0 refills | Status: AC
  Filled 2022-04-01 – 2022-05-20 (×2): qty 3, 28d supply, fill #0

## 2022-04-01 NOTE — Telephone Encounter (Signed)
This encounter was created in error - please disregard.

## 2022-04-01 NOTE — Telephone Encounter (Signed)
PA approved for the patient until 04/02/2023 for 8 tablet per month. The plan denied 10 tablet per month. I will resend script to be processed for 8 tablet

## 2022-04-01 NOTE — Addendum Note (Signed)
Addended by: Darleen Crocker on: 04/01/2022 03:05 PM   Modules accepted: Orders

## 2022-04-01 NOTE — Telephone Encounter (Signed)
PA completed on CMM/optum rx [id number] Will await determination

## 2022-04-01 NOTE — Telephone Encounter (Signed)
Spoke with patient.  His pharmacy can not get Wegovy in stock.  [facility] who has it in stock.  Patient ok with sending Rx to [facility]

## 2022-04-01 NOTE — Telephone Encounter (Signed)
*  STAT* If patient is at the pharmacy, call can be transferred to refill team.   1. Which medications need to be refilled? (please list name of each medication and dose if known)   Semaglutide-Weight Management 1 MG/0.5ML SOAJ  2. Which pharmacy/location (including street and city if local pharmacy) is medication to be sent to?  [place], [address]  3. Do they need a 30 day or 90 day supply?   30 day  Pt c/o medication issue:  1. Name of Medication:   Semaglutide-Weight Management 1 MG/0.5ML SOAJ  2. How are you currently taking this medication (dosage and times per day)?   As prescribed  3. Are you having a reaction (difficulty breathing--STAT)?   No  4. What is your medication issue?   Patient stated he has been taking this medication as prescribed starting at .25mg  and increasing to .5mg .  He needs to start taking the 1mg  dosage but the pharmacy does not have this dosage and will not allow him to take the next higher dosage without approval.  Patient wants to know if he can skip the 1mg  dose and go to the 1.7mg  dose.

## 2022-04-30 ENCOUNTER — Ambulatory Visit: Payer: 59 | Admitting: Cardiology

## 2022-04-30 ENCOUNTER — Encounter: Payer: Self-pay | Admitting: Cardiology

## 2022-04-30 VITALS — BP 164/94 | HR 68 | Ht 73.0 in | Wt 368.4 lb

## 2022-04-30 DIAGNOSIS — I10 Essential (primary) hypertension: Secondary | ICD-10-CM | POA: Diagnosis not present

## 2022-04-30 DIAGNOSIS — G4733 Obstructive sleep apnea (adult) (pediatric): Secondary | ICD-10-CM | POA: Diagnosis not present

## 2022-04-30 DIAGNOSIS — I5032 Chronic diastolic (congestive) heart failure: Secondary | ICD-10-CM | POA: Diagnosis not present

## 2022-04-30 MED ORDER — CLONIDINE HCL 0.1 MG PO TABS: 0.1000 mg | ORAL_TABLET | Freq: Once | ORAL | Status: DC

## 2022-04-30 MED ORDER — VALSARTAN 160 MG PO TABS: 160.0000 mg | ORAL_TABLET | Freq: Two times a day (BID) | ORAL | 3 refills | Status: DC

## 2022-04-30 NOTE — Patient Instructions (Addendum)
Medication Instructions:  Your physician has recommended you make the following change in your medication:  INCREASE: Valsartan 160 mg twice daily   Please schedule a appointment with our pharmacy to be seen in 2 weeks.  *If you need a refill on your cardiac medications before your next appointment, please call your pharmacy*   Lab Work: None If you have labs (blood work) drawn today and your tests are completely normal, you will receive your results only by: [place] (if you have MyChart) OR A paper copy in the mail If you have any lab test that is abnormal or we need to change your treatment, we will call you to review the results.   Testing/Procedures: None   Follow-Up: At [facility], you and your health needs are our priority.  As part of our continuing mission to provide you with exceptional heart care, we have created designated Provider Care Teams.  These Care Teams include your primary Cardiologist (physician) and Advanced Practice Providers (APPs -  Physician Assistants and Nurse Practitioners) who all work together to provide you with the care you need, when you need it.  We recommend signing up for the patient portal called "MyChart".  Sign up information is provided on this After Visit Summary.  MyChart is used to connect with patients for Virtual Visits (Telemedicine).  Patients are able to view lab/test results, encounter notes, upcoming appointments, etc.  Non-urgent messages can be sent to your provider as well.   To learn more about what you can do with MyChart, go to [email or web address].    Your next appointment:   4 month(s)  The format for your next appointment:   In Person  Provider:   Berniece Salines, DO     Other Instructions   Important Information About Sugar

## 2022-04-30 NOTE — Progress Notes (Signed)
Cardiology Office Note:    Date:  05/03/2022   ID:  Calvin Bailey, DOB November 18, 1980, MRN [id number]  PCP:  [place], [name], MD  Cardiologist:  Berniece Salines, DO  Electrophysiologist:  None   Referring MD: No ref. provider found   " I am doing ok"  History of Present Illness:    Calvin Bailey is a 41 y.o. male with a hx of hypertension,tobacco use, CKD, OSA , dyslipidemia presents today for a follow up.   I did see patient on 06/01/2019 in an initial evaluation. At which time he was on taking carvedilol 25 mg twice daily, amlodipine 10 mg daily, and Lasix 40mg  daily. During his visit his blood pressure was elevated and he did have bilateral leg edema. He medications were modified: Amlodipine was decreased to 5mg  due to concern for leg edema and Losartan 50mg  was added.    He follow up on 06/08/2019 at which time aldactone 12.5mg  was added to the patient's regimen.  He came for a bp check visit on 07/06/2019 given his elevated blood pressure his aldactone was increased to 25mg  daily   On July 12, 2019 during that visit hydralazine 5 mg twice a day was added and he was advised that it was okay to take his Lasix on a as needed basis.   I saw the patient back in November 2020 at that time his blood pressures were still not under 130/80 I started patient on labetalol twice a day.  He remain on Aldactone, amlodipine, losartan and hydralazine twice daily.  We asked for 2 months follow-up unfortunately the patient had not follow-up.  His follow-up today has been prompted due to refill request and the fact that the patient had not been seen over 1 year.   At his visit on November 14, 2020 his blood pressure had improved but not still at target.  I will increase his hydralazine to 50 mg twice a day, he remains on Aldactone 25 mg daily, labetalol 200 mg twice a day, losartan 50 mg daily, amlodipine 10 mg daily as well as Lasix 40 mg daily.   I saw the patient on October 26, 2021 at that time he  was hypertensive.  He had not been taking his medication that was prescribed.  We spoke significantly in great detail about adherence to his medication. I also increased his hydralazine given the extent of his hypertensive during that time.  I referred the patient to our Pharm.D. program for weight loss.  I saw the patient on Feb 05, 2022 at that time he has started seeing my pharmacy colleagues and started [facility] 8.25 mg subcu.  He was hypertensive and had ran out of his medication but unfortunately had not called the office.  We refilled his medications.  Again spoke to the patient about adherence of his antihypertensive medication.  Since his visit he has been to the emergency department for intractable migraines.  Unfortunately he had not follow-up with the Pharm.D. in 2 weeks as advised.  Past Medical History:  Diagnosis Date   Dizziness 09/25/2019   GERD (gastroesophageal reflux disease)    HTN (hypertension)    Obesity hypoventilation syndrome (HCC) 04/08/2014   OSA (obstructive sleep apnea) 04/08/2014   PFO (patent foramen ovale)    Sleep apnea     Past Surgical History:  Procedure Laterality Date   two knee surgeries Left 1995    Current Medications: Current Meds  Medication Sig   amLODipine (NORVASC) 10 MG tablet  Take 1 tablet (10 mg total) by mouth daily.   cyclobenzaprine (FLEXERIL) 10 MG tablet One tab at bedtime for tension neck pain   hydrALAZINE (APRESOLINE) 50 MG tablet Take 1.5 tablets (75 mg total) by mouth in the morning and at bedtime.   ibuprofen (ADVIL) 200 MG tablet Take 600 mg by mouth 2 (two) times daily as needed for headache.   labetalol (NORMODYNE) 200 MG tablet Take 1 tablet (200 mg total) by mouth 2 (two) times daily.   naproxen sodium (ALEVE) 220 MG tablet Take 660 mg by mouth 2 (two) times daily as needed (headache).   spironolactone (ALDACTONE) 25 MG tablet Take 1 tablet (25 mg total) by mouth daily.   topiramate (TOPAMAX) 50 MG tablet Take 1 tablet  (50 mg total) by mouth daily.   Ubrogepant (UBRELVY) 100 MG TABS Take 100 mg by mouth as needed (Take 1 tablet at onset of migraine. may repeat an additional dose in 2 hours if needed (Do NOT exceed more than 2 tablets in 24 hours)). 10 mg at headache onset.   valsartan (DIOVAN) 160 MG tablet Take 1 tablet (160 mg total) by mouth 2 (two) times daily.   [DISCONTINUED] valsartan (DIOVAN) 160 MG tablet Take 1 tablet (160 mg total) by mouth daily.   Current Facility-Administered Medications for the 04/30/22 encounter (Office Visit) with Berniece Salines, DO  Medication   cloNIDine (CATAPRES) tablet 0.1 mg     Allergies:   Chlorthalidone and Lisinopril   Social History   Socioeconomic History   Marital status: Single    Spouse name: Not on file   Number of children: 2   Years of education: college   Highest education level: Some college, no degree  Occupational History   Occupation: unemployment    Fish farm manager: UNEMPLOYED    Employer: [employer]  Tobacco Use   Smoking status: Every Day    Packs/day: 1.00    Years: 16.00    Total pack years: 16.00    Types: Cigarettes   Smokeless tobacco: Never  Vaping Use   Vaping Use: Never used  Substance and Sexual Activity   Alcohol use: Yes    Alcohol/week: 0.0 standard drinks of alcohol    Comment: sparingly    Drug use: No   Sexual activity: Yes    Partners: Female  Other Topics Concern   Not on file  Social History Narrative   Nature conservation officer   Patient is single and lives alone.   Patient is left-handed.   Patient does not drink any caffeine.   Social Determinants of Health   Financial Resource Strain: Not on file  Food Insecurity: Not on file  Transportation Needs: Not on file  Physical Activity: Not on file  Stress: Not on file  Social Connections: Not on file     Family History: The patient's family history includes CVA in his maternal uncle; Diabetes in his maternal grandmother; Heart failure in his maternal  grandfather and paternal grandfather; Hyperlipidemia in his paternal grandfather; Hypertension in his brother, father, and mother; Multiple sclerosis in his mother.  ROS:   Review of Systems  Constitution: Negative for decreased appetite, fever and weight gain.  HENT: Negative for congestion, ear discharge, hoarse voice and sore throat.   Eyes: Negative for discharge, redness, vision loss in right eye and visual halos.  Cardiovascular: Negative for chest pain, dyspnea on exertion, leg swelling, orthopnea and palpitations.  Respiratory: Negative for cough, hemoptysis, shortness of breath and snoring.   Endocrine: Negative  for heat intolerance and polyphagia.  Hematologic/Lymphatic: Negative for bleeding problem. Does not bruise/bleed easily.  Skin: Negative for flushing, nail changes, rash and suspicious lesions.  Musculoskeletal: Negative for arthritis, joint pain, muscle cramps, myalgias, neck pain and stiffness.  Gastrointestinal: Negative for abdominal pain, bowel incontinence, diarrhea and excessive appetite.  Genitourinary: Negative for decreased libido, genital sores and incomplete emptying.  Neurological: Negative for brief paralysis, focal weakness, headaches and loss of balance.  Psychiatric/Behavioral: Negative for altered mental status, depression and suicidal ideas.  Allergic/Immunologic: Negative for HIV exposure and persistent infections.    EKGs/Labs/Other Studies Reviewed:    The following studies were reviewed today:   EKG:  None today   Echocardiogram October 23, 2020 IMPRESSIONS   1. Left ventricular ejection fraction, by estimation, is 60 to 65%. The left ventricle has normal function. The left ventricle has no regional wall motion abnormalities. Left ventricular diastolic parameters are consistent with Grade I diastolic  dysfunction (impaired relaxation).   2. Right ventricular systolic function is normal. The right ventricular size is normal.   3. Left atrial size  was moderately dilated.   4. The mitral valve is normal in structure. No evidence of mitral valve regurgitation. No evidence of mitral stenosis.   5. The aortic valve is normal in structure. Aortic valve regurgitation is  mild. No aortic stenosis is present.   6. There is mild dilatation of the ascending aorta, measuring 38 mm.   7. The inferior vena cava is normal in size with greater than 50%  respiratory variability, suggesting right atrial pressure of 3 mmHg.   Comparison(s): EF 55-60%, LVH.   FINDINGS   Left Ventricle: Left ventricular ejection fraction, by estimation, is 60  to 65%. The left ventricle has normal function. The left ventricle has no  regional wall motion abnormalities. Definity contrast agent was given IV  to delineate the left ventricular   endocardial borders. The left ventricular internal cavity size was normal  in size. There is borderline left ventricular hypertrophy. Left  ventricular diastolic parameters are consistent with Grade I diastolic  dysfunction (impaired relaxation).   Right Ventricle: The right ventricular size is normal. No increase in  right ventricular wall thickness. Right ventricular systolic function is  normal.   Left Atrium: Left atrial size was moderately dilated.   Right Atrium: Right atrial size was normal in size.   Pericardium: There is no evidence of pericardial effusion.   Mitral Valve: The mitral valve is normal in structure. No evidence of mitral valve regurgitation. No evidence of mitral valve stenosis.   Tricuspid Valve: The tricuspid valve is normal in structure. Tricuspid valve regurgitation is not demonstrated. No evidence of tricuspid  stenosis.   Aortic Valve: The aortic valve is normal in structure. Aortic valve regurgitation is mild. No aortic stenosis is present. Aortic valve mean  gradient measures 9.0 mmHg. Aortic valve peak gradient measures 16.3 mmHg. Aortic valve area, by VTI measures 2.60   cm.   Pulmonic  Valve: The pulmonic valve was normal in structure. Pulmonic valve regurgitation is not visualized. No evidence of pulmonic stenosis.   Aorta: The aortic root is normal in size and structure. There is mild  dilatation of the ascending aorta, measuring 38 mm.   Venous: The inferior vena cava is normal in size with greater than 50% respiratory variability, suggesting right atrial pressure of 3 mmHg.   IAS/Shunts: No atrial level shunt detected by color flow Doppler.   Recent Labs: 02/27/2022: ALT 27; BUN 21;  Creatinine, Ser 1.72; Hemoglobin 14.4; Magnesium 2.1; Platelets 224; Potassium 4.2; Sodium 138  Recent Lipid Panel    Component Value Date/Time   CHOL 136 02/27/2022 0756   CHOL 149 05/30/2019 1500   TRIG 74 02/27/2022 0756   HDL 28 (L) 02/27/2022 0756   HDL 34 (L) 05/30/2019 1500   CHOLHDL 4.9 02/27/2022 0756   VLDL 15 02/27/2022 0756   LDLCALC 93 02/27/2022 0756   LDLCALC 93 05/30/2019 1500    Physical Exam:    VS:  BP (!) 164/94   Pulse 68   Ht 6\' 1"  (1.854 m)   Wt (!) 368 lb 6.4 oz (167.1 kg)   SpO2 98%   BMI 48.60 kg/m     Wt Readings from Last 3 Encounters:  04/30/22 (!) 368 lb 6.4 oz (167.1 kg)  03/31/22 (!) 375 lb (170.1 kg)  03/22/22 (!) 379 lb 12.8 oz (172.3 kg)     GEN: Well nourished, well developed in no acute distress HEENT: Normal NECK: No JVD; No carotid bruits LYMPHATICS: No lymphadenopathy CARDIAC: S1S2 noted,RRR, no murmurs, rubs, gallops RESPIRATORY:  Clear to auscultation without rales, wheezing or rhonchi  ABDOMEN: Soft, non-tender, non-distended, +bowel sounds, no guarding. EXTREMITIES: No edema, No cyanosis, no clubbing MUSCULOSKELETAL:  No deformity  SKIN: Warm and dry NEUROLOGIC:  Alert and oriented x 3, non-focal PSYCHIATRIC:  Normal affect, good insight  ASSESSMENT:    1. Accelerated hypertension   2. Essential hypertension   3. Chronic diastolic CHF (congestive heart failure) ([place])   4. OSA (obstructive sleep apnea)   5. Morbid  obesity ([place])    PLAN:     His blood pressure is elevated in the office today 170/110 mmHg, I did give the patient clonidine 0.1mg  x 1 dose in the office and this came down to 169/94 mmhg.   We talked about medication compliance he tells me he has been taking his medications.  I am not so sure but I would respect what he tells me at this time.  In the meantime I am going to refer the patient to our advanced hypertension clinic as he has been on multiple antihypertensive medications: Amlodipine 10 mg daily, hydralazine 75 mg twice a day-he tells me he is unable to take this 3 times a day, labetalol 200 mg twice a day (did not tolerate Coreg very well), spironolactone 25 mg daily, will increase valsartan to 160 mg twice a day.  He has not been started on clonidine as I am still concerned about compliance in this patient and if given this medication rebound hypertension could be an issue and he is already uncontrolled hypertensive individual.  The patient understands the need to lose weight with diet and exercise. We have discussed specific strategies for this.  Recently started on Wegovy.  The patient is in agreement with the above plan. The patient left the office in stable condition.  The patient will follow up in   Medication Adjustments/Labs and Tests Ordered: Current medicines are reviewed at length with the patient today.  Concerns regarding medicines are outlined above.  Orders Placed This Encounter  Procedures   AMB REFERRAL TO ADVANCED HTN CLINIC   Meds ordered this encounter  Medications   cloNIDine (CATAPRES) tablet 0.1 mg   valsartan (DIOVAN) 160 MG tablet    Sig: Take 1 tablet (160 mg total) by mouth 2 (two) times daily.    Dispense:  180 tablet    Refill:  3    Patient Instructions  Medication  Instructions:  Your physician has recommended you make the following change in your medication:  INCREASE: Valsartan 160 mg twice daily   Please schedule a appointment with our  pharmacy to be seen in 2 weeks.  *If you need a refill on your cardiac medications before your next appointment, please call your pharmacy*   Lab Work: None If you have labs (blood work) drawn today and your tests are completely normal, you will receive your results only by: [place] (if you have MyChart) OR A paper copy in the mail If you have any lab test that is abnormal or we need to change your treatment, we will call you to review the results.   Testing/Procedures: None   Follow-Up: At [facility], you and your health needs are our priority.  As part of our continuing mission to provide you with exceptional heart care, we have created designated Provider Care Teams.  These Care Teams include your primary Cardiologist (physician) and Advanced Practice Providers (APPs -  Physician Assistants and Nurse Practitioners) who all work together to provide you with the care you need, when you need it.  We recommend signing up for the patient portal called "MyChart".  Sign up information is provided on this After Visit Summary.  MyChart is used to connect with patients for Virtual Visits (Telemedicine).  Patients are able to view lab/test results, encounter notes, upcoming appointments, etc.  Non-urgent messages can be sent to your provider as well.   To learn more about what you can do with MyChart, go to [email or web address].    Your next appointment:   4 month(s)  The format for your next appointment:   In Person  Provider:   Berniece Salines, DO     Other Instructions   Important Information About Sugar         Adopting a Healthy Lifestyle.  Know what a healthy weight is for you (roughly BMI <25) and aim to maintain this   Aim for 7+ servings of fruits and vegetables daily   65-80+ fluid ounces of water or unsweet tea for healthy kidneys   Limit to max 1 drink of alcohol per day; avoid smoking/tobacco   Limit animal fats in diet for cholesterol and heart  health - choose grass fed whenever available   Avoid highly processed foods, and foods high in saturated/trans fats   Aim for low stress - take time to unwind and care for your mental health   Aim for 150 min of moderate intensity exercise weekly for heart health, and weights twice weekly for bone health   Aim for 7-9 hours of sleep daily   When it comes to diets, agreement about the perfect plan isnt easy to find, even among the experts. Experts at the [employer] developed an idea known as the Healthy Eating Plate. Just imagine a plate divided into logical, healthy portions.   The emphasis is on diet quality:   Load up on vegetables and fruits - one-half of your plate: Aim for color and variety, and remember that potatoes dont count.   Go for whole grains - one-quarter of your plate: Whole wheat, barley, wheat berries, quinoa, oats, brown rice, and foods made with them. If you want pasta, go with whole wheat pasta.   Protein power - one-quarter of your plate: Fish, chicken, beans, and nuts are all healthy, versatile protein sources. Limit red meat.   The diet, however, does go beyond the plate, offering a few other  suggestions.   Use healthy plant oils, such as olive, canola, soy, corn, sunflower and peanut. Check the labels, and avoid partially hydrogenated oil, which have unhealthy trans fats.   If youre thirsty, drink water. Coffee and tea are good in moderation, but skip sugary drinks and limit milk and dairy products to one or two daily servings.   The type of carbohydrate in the diet is more important than the amount. Some sources of carbohydrates, such as vegetables, fruits, whole grains, and beans-are healthier than others.   Finally, stay active  Signed, Berniece Salines, DO  05/03/2022 9:19 AM    [place]

## 2022-05-04 ENCOUNTER — Telehealth: Payer: Self-pay | Admitting: Neurology

## 2022-05-04 NOTE — Telephone Encounter (Signed)
[employer] no auth req.  Patient is scheduled at [facility] for 05/25/22 at 1:30 pm.  Mailed packet to the patient.

## 2022-05-05 ENCOUNTER — Other Ambulatory Visit: Payer: Self-pay | Admitting: Cardiology

## 2022-05-20 ENCOUNTER — Other Ambulatory Visit (HOSPITAL_BASED_OUTPATIENT_CLINIC_OR_DEPARTMENT_OTHER): Payer: Self-pay

## 2022-05-24 ENCOUNTER — Encounter (HOSPITAL_BASED_OUTPATIENT_CLINIC_OR_DEPARTMENT_OTHER): Payer: Self-pay | Admitting: Family Medicine

## 2022-05-24 ENCOUNTER — Ambulatory Visit (INDEPENDENT_AMBULATORY_CARE_PROVIDER_SITE_OTHER): Payer: 59 | Admitting: Family Medicine

## 2022-05-24 VITALS — BP 170/108 | HR 71 | Temp 97.8°F | Ht 73.0 in | Wt 366.1 lb

## 2022-05-24 DIAGNOSIS — Z23 Encounter for immunization: Secondary | ICD-10-CM

## 2022-05-24 DIAGNOSIS — Z0000 Encounter for general adult medical examination without abnormal findings: Secondary | ICD-10-CM | POA: Insufficient documentation

## 2022-05-24 NOTE — Patient Instructions (Signed)
  Medication Instructions:  Your physician recommends that you continue on your current medications as directed. Please refer to the Current Medication list given to you today. --If you need a refill on any your medications before your next appointment, please call your pharmacy first. If no refills are authorized on file call the office.-- Lab Work: Your physician has recommended that you have lab work today: No If you have labs (blood work) drawn today and your tests are completely normal, you will receive your results via MyChart message OR a phone call from our staff.  Please ensure you check your voicemail in the event that you authorized detailed messages to be left on a delegated number. If you have any lab test that is abnormal or we need to change your treatment, we will call you to review the results.  Referrals/Procedures/Imaging: No  Follow-Up: Your next appointment:   Your physician recommends that you schedule a follow-up appointment in: 4-6 months with Dr. [place].  You will receive a text message or e-mail with a link to a survey about your care and experience with us today! We would greatly appreciate your feedback!   Thanks for letting us be apart of your health journey!!  Primary Care and Sports Medicine   Dr. Raymond de [place]   We encourage you to activate your patient portal called "MyChart".  Sign up information is provided on this After Visit Summary.  MyChart is used to connect with patients for Virtual Visits (Telemedicine).  Patients are able to view lab/test results, encounter notes, upcoming appointments, etc.  Non-urgent messages can be sent to your provider as well. To learn more about what you can do with MyChart, please visit --  [email or web address].    

## 2022-05-24 NOTE — Assessment & Plan Note (Addendum)
Routine HCM labs reviewed. HCM reviewed/discussed. Anticipatory guidance regarding healthy weight, lifestyle and choices given. Recommend healthy diet.  Recommend approximately 150 minutes/week of moderate intensity exercise Recommend regular dental and vision exams Always use seatbelt/lap and shoulder restraints Recommend using smoke alarms and checking batteries at least twice a year Recommend using sunscreen when outside Discussed tetanus immunization recommendations, patient agreed to proceed with this today Discussed recommendation for seasonal influenza, patient declines today

## 2022-05-24 NOTE — Progress Notes (Signed)
Subjective:    CC: Annual Physical Exam  HPI: Calvin Bailey is a 41 y.o. presenting for annual physical  I reviewed the past medical history, family history, social history, surgical history, and allergies today and no changes were needed.  Please see the problem list section below in epic for further details.  Past Medical History: Past Medical History:  Diagnosis Date   Dizziness 09/25/2019   GERD (gastroesophageal reflux disease)    HTN (hypertension)    Obesity hypoventilation syndrome (HCC) 04/08/2014   OSA (obstructive sleep apnea) 04/08/2014   PFO (patent foramen ovale)    Sleep apnea    Past Surgical History: Past Surgical History:  Procedure Laterality Date   two knee surgeries Left 1995   Social History: Social History   Socioeconomic History   Marital status: Single    Spouse name: Not on file   Number of children: 2   Years of education: college   Highest education level: Some college, no degree  Occupational History   Occupation: unemployment    Fish farm manager: UNEMPLOYED    Employer: [employer]  Tobacco Use   Smoking status: Every Day    Packs/day: 1.00    Years: 16.00    Total pack years: 16.00    Types: Cigarettes   Smokeless tobacco: Never  Vaping Use   Vaping Use: Never used  Substance and Sexual Activity   Alcohol use: Yes    Alcohol/week: 0.0 standard drinks of alcohol    Comment: sparingly    Drug use: No   Sexual activity: Yes    Partners: Female  Other Topics Concern   Not on file  Social History Narrative   Nature conservation officer   Patient is single and lives alone.   Patient is left-handed.   Patient does not drink any caffeine.   Social Determinants of Health   Financial Resource Strain: Not on file  Food Insecurity: Not on file  Transportation Needs: Not on file  Physical Activity: Not on file  Stress: Not on file  Social Connections: Not on file   Family History: Family History  Problem Relation Age of Onset    Hypertension Mother    Multiple sclerosis Mother    Hypertension Father    Hypertension Brother    Hyperlipidemia Paternal Grandfather    Heart failure Paternal Grandfather    Diabetes Maternal Grandmother    Heart failure Maternal Grandfather    CVA Maternal Uncle    Allergies: Allergies  Allergen Reactions   Chlorthalidone Nausea Only   Lisinopril Cough   Medications: See med rec.  Review of Systems: No headache, visual changes, nausea, vomiting, diarrhea, constipation, dizziness, abdominal pain, skin rash, fevers, chills, night sweats, swollen lymph nodes, weight loss, chest pain, body aches, joint swelling, muscle aches, shortness of breath, mood changes, visual or auditory hallucinations.  Objective:    BP (!) 170/108   Pulse 71   Temp 97.8 F (36.6 C) (Oral)   Ht 6\' 1"  (1.854 m)   Wt (!) 366 lb 1.6 oz (166.1 kg)   SpO2 100%   BMI 48.30 kg/m   General: Well Developed, well nourished, and in no acute distress.  Neuro: Alert and oriented x3, extra-ocular muscles intact, sensation grossly intact. Cranial nerves II through XII are intact, motor, sensory, and coordinative functions are all intact. HEENT: Normocephalic, atraumatic, pupils equal round reactive to light, neck supple, no masses, no lymphadenopathy, thyroid nonpalpable. Oropharynx, nasopharynx, external ear canals are unremarkable. Skin: Warm and dry, no rashes  noted. Cardiac: Regular rate and rhythm, no murmurs rubs or gallops. Respiratory: Clear to auscultation bilaterally. Not using accessory muscles, speaking in full sentences. Abdominal: Soft, nontender, nondistended, positive bowel sounds, no masses, no organomegaly. Musculoskeletal: Shoulder, elbow, wrist, hip, knee, ankle stable, and with full range of motion.  Impression and Recommendations:    Wellness examination Routine HCM labs reviewed. HCM reviewed/discussed. Anticipatory guidance regarding healthy weight, lifestyle and choices given. Recommend  healthy diet.  Recommend approximately 150 minutes/week of moderate intensity exercise Recommend regular dental and vision exams Always use seatbelt/lap and shoulder restraints Recommend using smoke alarms and checking batteries at least twice a year Recommend using sunscreen when outside Discussed tetanus immunization recommendations, patient agreed to proceed with this today Discussed recommendation for seasonal influenza, patient declines today  Return in about 4 months (around 09/23/2022) for HTN.   ___________________________________________ Blaize Epple de [place], MD, ABFM, CAQSM [facility] [place]

## 2022-05-25 ENCOUNTER — Other Ambulatory Visit: Payer: Self-pay | Admitting: Neurology

## 2022-05-25 ENCOUNTER — Other Ambulatory Visit: Payer: Self-pay

## 2022-05-25 ENCOUNTER — Encounter (HOSPITAL_BASED_OUTPATIENT_CLINIC_OR_DEPARTMENT_OTHER): Payer: Self-pay

## 2022-05-25 ENCOUNTER — Emergency Department (HOSPITAL_BASED_OUTPATIENT_CLINIC_OR_DEPARTMENT_OTHER)
Admission: EM | Admit: 2022-05-25 | Discharge: 2022-05-25 | Disposition: A | Discharge status: Home / Self Care | Payer: 59 | Attending: Emergency Medicine | Admitting: Emergency Medicine

## 2022-05-25 DIAGNOSIS — Z79899 Other long term (current) drug therapy: Secondary | ICD-10-CM | POA: Insufficient documentation

## 2022-05-25 DIAGNOSIS — I119 Hypertensive heart disease without heart failure: Secondary | ICD-10-CM

## 2022-05-25 DIAGNOSIS — G43011 Migraine without aura, intractable, with status migrainosus: Secondary | ICD-10-CM

## 2022-05-25 DIAGNOSIS — I10 Essential (primary) hypertension: Secondary | ICD-10-CM | POA: Insufficient documentation

## 2022-05-25 DIAGNOSIS — N184 Chronic kidney disease, stage 4 (severe): Secondary | ICD-10-CM

## 2022-05-25 DIAGNOSIS — Z9989 Dependence on other enabling machines and devices: Secondary | ICD-10-CM

## 2022-05-25 LAB — COMPREHENSIVE METABOLIC PANEL
ALT: 32 U/L (ref 0–44)
AST: 20 U/L (ref 15–41)
Albumin: 4.5 g/dL (ref 3.5–5.0)
Alkaline Phosphatase: 75 U/L (ref 38–126)
Anion gap: 9 (ref 5–15)
BUN: 23 mg/dL — ABNORMAL HIGH (ref 6–20)
CO2: 22 mmol/L (ref 22–32)
Calcium: 9.7 mg/dL (ref 8.9–10.3)
Chloride: 107 mmol/L (ref 98–111)
Creatinine, Ser: 1.74 mg/dL — ABNORMAL HIGH (ref 0.61–1.24)
GFR, Estimated: 50 mL/min — ABNORMAL LOW (ref >60)
Glucose, Bld: 91 mg/dL (ref 70–99)
Potassium: 3.9 mmol/L (ref 3.5–5.1)
Sodium: 138 mmol/L (ref 135–145)
Total Bilirubin: 0.5 mg/dL (ref 0.3–1.2)
Total Protein: 7.4 g/dL (ref 6.5–8.1)

## 2022-05-25 LAB — CBC WITH DIFFERENTIAL/PLATELET
Abs Immature Granulocytes: 0.04 10*3/uL (ref 0.00–0.07)
Basophils Absolute: 0 10*3/uL (ref 0.0–0.1)
Basophils Relative: 1 %
Eosinophils Absolute: 0.3 10*3/uL (ref 0.0–0.5)
Eosinophils Relative: 3 %
HCT: 40.6 % (ref 39.0–52.0)
Hemoglobin: 14 g/dL (ref 13.0–17.0)
Immature Granulocytes: 1 %
Lymphocytes Relative: 24 %
Lymphs Abs: 2 10*3/uL (ref 0.7–4.0)
MCH: 29.7 pg (ref 26.0–34.0)
MCHC: 34.5 g/dL (ref 30.0–36.0)
MCV: 86 fL (ref 80.0–100.0)
Monocytes Absolute: 0.6 10*3/uL (ref 0.1–1.0)
Monocytes Relative: 7 %
Neutro Abs: 5.5 10*3/uL (ref 1.7–7.7)
Neutrophils Relative %: 64 %
Platelets: 239 10*3/uL (ref 150–400)
RBC: 4.72 MIL/uL (ref 4.22–5.81)
RDW: 12.3 % (ref 11.5–15.5)
WBC: 8.5 10*3/uL (ref 4.0–10.5)
nRBC: 0 % (ref 0.0–0.2)

## 2022-05-25 NOTE — ED Notes (Signed)
Provider at bedside

## 2022-05-25 NOTE — ED Provider Notes (Signed)
[facility] EMERGENCY DEPT Provider Note   CSN: [id number] Arrival date & time: 05/25/22  1919     History  Chief Complaint  Patient presents with   Hypertension    Calvin Bailey is a 41 y.o. male.  Pt worried that his blood pressure is elevated.  Pt reports blood pressure was higher at home tonight than usual.  Pt saw his Md yesterday but he did not change treatment.  Pt denies chest pain  no headache.  Pt reports he did not sleep well last night. Pt has a history of sleep apnea.  He was suppose to have a sleep study done tonight but did not due because his blood pressure is high   The history is provided by the patient. No language interpreter was used.  Hypertension This is a recurrent problem. The problem occurs constantly. The problem has not changed since onset.Pertinent negatives include no chest pain and no headaches. Nothing aggravates the symptoms. Nothing relieves the symptoms.       Home Medications Prior to Admission medications   Medication Sig Start Date End Date Taking? Authorizing Provider  amLODipine (NORVASC) 10 MG tablet Take 1 tablet (10 mg total) by mouth daily. 02/05/22   Tobb, Godfrey Pick, DO  cyclobenzaprine (FLEXERIL) 10 MG tablet One tab at bedtime for tension neck pain 03/31/22   Dohmeier, Asencion Partridge, MD  hydrALAZINE (APRESOLINE) 50 MG tablet Take 1.5 tablets (75 mg total) by mouth in the morning and at bedtime. 02/05/22   Tobb, Kardie, DO  ibuprofen (ADVIL) 200 MG tablet Take 600 mg by mouth 2 (two) times daily as needed for headache.    [provider]  labetalol (NORMODYNE) 200 MG tablet Take 1 tablet (200 mg total) by mouth 2 (two) times daily. 02/05/22   Tobb, Kardie, DO  naproxen sodium (ALEVE) 220 MG tablet Take 660 mg by mouth 2 (two) times daily as needed (headache).    [provider]  Semaglutide-Weight Management 0.25 MG/0.5ML SOAJ Inject 0.25 mg into the skin every Friday.    [provider]  Semaglutide-Weight  Management 1.7 MG/0.75ML SOAJ Inject 1.7 mg into the skin once a week for 28 days. 04/25/22 06/17/22  Tobb, Godfrey Pick, DO  Semaglutide-Weight Management 2.4 MG/0.75ML SOAJ Inject 2.4 mg into the skin once a week for 28 days. 05/24/22 06/21/22  Tobb, Godfrey Pick, DO  spironolactone (ALDACTONE) 25 MG tablet Take 1 tablet (25 mg total) by mouth daily. 02/05/22   Tobb, Kardie, DO  topiramate (TOPAMAX) 50 MG tablet Take 1 tablet (50 mg total) by mouth daily. 03/31/22 03/31/23  Dohmeier, Asencion Partridge, MD  Ubrogepant (UBRELVY) 100 MG TABS Take 100 mg by mouth as needed (Take 1 tablet at onset of migraine. may repeat an additional dose in 2 hours if needed (Do NOT exceed more than 2 tablets in 24 hours)). 10 mg at headache onset. 04/01/22   Dohmeier, Asencion Partridge, MD  valsartan (DIOVAN) 160 MG tablet Take 1 tablet (160 mg total) by mouth 2 (two) times daily. 04/30/22   Tobb, Kardie, DO  LISINOPRIL-HYDROCHLOROTHIAZIDE PO Take by mouth.  07/06/19  [provider]      Allergies    Chlorthalidone and Lisinopril    Review of Systems   Review of Systems  Cardiovascular:  Negative for chest pain.  Neurological:  Negative for headaches.  All other systems reviewed and are negative.   Physical Exam Updated Vital Signs BP (!) 156/92   Pulse 60   Temp 99.3 F (37.4 C)   Resp  18   Ht 6\' 1"  (1.854 m)   Wt (!) 166.1 kg   SpO2 100%   BMI 48.31 kg/m  Physical Exam Vitals and nursing note reviewed.  Constitutional:      General: He is not in acute distress.    Appearance: He is well-developed.  HENT:     Head: Normocephalic and atraumatic.  Eyes:     Conjunctiva/sclera: Conjunctivae normal.  Cardiovascular:     Rate and Rhythm: Normal rate and regular rhythm.     Heart sounds: No murmur heard. Pulmonary:     Effort: Pulmonary effort is normal. No respiratory distress.     Breath sounds: Normal breath sounds.  Abdominal:     Palpations: Abdomen is soft.     Tenderness: There is no abdominal tenderness.   Musculoskeletal:        General: No swelling. Normal range of motion.     Cervical back: Neck supple.  Skin:    General: Skin is warm and dry.     Capillary Refill: Capillary refill takes less than 2 seconds.  Neurological:     Mental Status: He is alert.  Psychiatric:        Mood and Affect: Mood normal.     ED Results / Procedures / Treatments   Labs (all labs ordered are listed, but only abnormal results are displayed) Labs Reviewed  COMPREHENSIVE METABOLIC PANEL - Abnormal; Notable for the following components:      Result Value   BUN 23 (*)    Creatinine, Ser 1.74 (*)    GFR, Estimated 50 (*)    All other components within normal limits  CBC WITH DIFFERENTIAL/PLATELET    EKG None  Radiology No results found.  Procedures Procedures    Medications Ordered in ED Medications - No data to display  ED Course/ Medical Decision Making/ A&P                           Medical Decision Making Amount and/or Complexity of Data Reviewed Labs: ordered.           Final Clinical Impression(s) / ED Diagnoses Final diagnoses:  Essential hypertension    Rx / DC Orders ED Discharge Orders     None      An After Visit Summary was printed and given to the patient.    Fransico Meadow, Hershal Coria 05/25/22 2254    Tretha Sciara, MD 05/26/22 [phone]

## 2022-05-25 NOTE — ED Notes (Signed)
Pt agreeable with d/c plan as discussed by provider- this nurse has verbally reinforced d/c instructions and provided pt with written copy; pt acknowledges verbal understanding - denies any additional questions, concerns, needs- pt ambulatory at d/c with steady gait; no distress.  

## 2022-05-25 NOTE — Discharge Instructions (Addendum)
Take your blood pressure medications tonight as scheduled.  Reschedule your sleep study.  Call your Physician tomorrow to schedule recheck of your blood pressure

## 2022-05-25 NOTE — ED Triage Notes (Addendum)
Patient here POV from Home.  Endorses HTN Intermittently for approximately 1 Month. Seeks ED Evaluation Today due to "Sluggishness" for which he is being assessed by a Neurologist recently for.   History of Treated HTN. Seen by PCP Yesterday for Routine Assessment and Topic was not discussed. BP has been 762-263 Systolic. No Pain. No SOB.   NAD Noted during Triage. A&Ox4. GCS 15. Ambulatory.

## 2022-05-26 ENCOUNTER — Ambulatory Visit: Payer: 59 | Attending: Internal Medicine | Admitting: Pharmacist Clinician (PhC)/ Clinical Pharmacy Specialist

## 2022-05-26 ENCOUNTER — Telehealth: Payer: Self-pay | Admitting: Neurology

## 2022-05-26 ENCOUNTER — Encounter: Payer: Self-pay | Admitting: Pharmacist Clinician (PhC)/ Clinical Pharmacy Specialist

## 2022-05-26 DIAGNOSIS — I10 Essential (primary) hypertension: Secondary | ICD-10-CM

## 2022-05-26 DIAGNOSIS — F17200 Nicotine dependence, unspecified, uncomplicated: Secondary | ICD-10-CM

## 2022-05-26 MED ORDER — LABETALOL HCL 300 MG PO TABS
300.0000 mg | ORAL_TABLET | Freq: Two times a day (BID) | ORAL | 1 refills | Status: DC
Start: 2022-05-26 — End: 2022-07-01

## 2022-05-26 NOTE — Progress Notes (Unsigned)
05/26/2022 Calvin Bailey 06-13-1981 [id number]   HPI:  Calvin Bailey is a 41 y.o. male patient of Dr Harriet Masson, with a PMH below who presents today for hypertension clinic evaluation.  He was seen by Dr. Harriet Masson earlier this month and found to have a BP of 164/94.   Monday was 170/108 at PCP for Wellness exam, then Tuesday he ended up in the ED, worried about his home readings.  He states compliance with medications for the most part, but does admit to occasionally missing evening doses of his meds.    He is in the office today to follow up on recent elevated BP readings.  Has home BP cuff that he uses on upper arm.  He will need to bring that cuff to the office for validation, as if it is too small it could account for the higher readings at home he is reporting.   Past Medical History: hyperlipidemia 6/23 LDL 93  CKD GFR 50  OSA   obesity On Wegovy 1.7 mg dose - has dropped BMI from 52.6 to 48.2  migraines Has chronic headaches     Blood Pressure Goal:  130/80  Current Medications:   amlodipine 10 mg qd - am , hydralazine 100 mg bid, labetalol 200 mg bid, spironolactone 25 mg qd - am, valsartan 160 mg bid  Family Hx:  father recent diagnosis of hypertension, mother no htn, brothers fine; 2 young children  Social Hx:    smokes about 3 cig/day; rare alcohol, no regular caffeine  Diet:    cut back on portions with Wegovy down 34 pounds since starting  Exercise: 3 times each week, cardio weights, walking; walks regularly at work  Home BP readings:   160-170"s/100's - down to 150/90 if laying down  Intolerances:  lisinopril - cough , chlorthalidone - nausea  Labs:  05/25/22: Na 138, K 3.9, Glu 131, BUN 23, SCr 1.74   Wt Readings from Last 3 Encounters:  05/26/22 (!) 365 lb (165.6 kg)  05/25/22 (!) 366 lb 2.9 oz (166.1 kg)  05/24/22 (!) 366 lb 1.6 oz (166.1 kg)   BP Readings from Last 3 Encounters:  05/26/22 (!) 153/98  05/25/22 (!) 153/96  05/24/22 (!) 170/108   Pulse  Readings from Last 3 Encounters:  05/26/22 76  05/25/22 (!) 59  05/24/22 71    Current Outpatient Medications  Medication Sig Dispense Refill   amLODipine (NORVASC) 10 MG tablet Take 1 tablet (10 mg total) by mouth daily. 90 tablet 3   gabapentin (NEURONTIN) 100 MG capsule Take 100 mg by mouth at bedtime.     hydrALAZINE (APRESOLINE) 50 MG tablet Take 1.5 tablets (75 mg total) by mouth in the morning and at bedtime. (Patient taking differently: Take 100 mg by mouth in the morning and at bedtime.) 270 tablet 3   ibuprofen (ADVIL) 200 MG tablet Take 600 mg by mouth 2 (two) times daily as needed for headache.     labetalol (NORMODYNE) 200 MG tablet Take 1 tablet (200 mg total) by mouth 2 (two) times daily. 180 tablet 3   naproxen sodium (ALEVE) 220 MG tablet Take 660 mg by mouth 2 (two) times daily as needed (headache).     Semaglutide-Weight Management 1.7 MG/0.75ML SOAJ Inject 1.7 mg into the skin once a week for 28 days. 3 mL 0   Semaglutide-Weight Management 2.4 MG/0.75ML SOAJ Inject 2.4 mg into the skin once a week for 28 days. 3 mL 0  spironolactone (ALDACTONE) 25 MG tablet Take 1 tablet (25 mg total) by mouth daily. 90 tablet 3   topiramate (TOPAMAX) 50 MG tablet Take 1 tablet (50 mg total) by mouth daily. 60 tablet 2   Ubrogepant (UBRELVY) 100 MG TABS Take 100 mg by mouth as needed (Take 1 tablet at onset of migraine. may repeat an additional dose in 2 hours if needed (Do NOT exceed more than 2 tablets in 24 hours)). 10 mg at headache onset. 8 tablet 5   valsartan (DIOVAN) 160 MG tablet Take 1 tablet (160 mg total) by mouth 2 (two) times daily. 180 tablet 3   cyclobenzaprine (FLEXERIL) 10 MG tablet One tab at bedtime for tension neck pain (Patient not taking: Reported on 05/26/2022) 30 tablet 1   Current Facility-Administered Medications  Medication Dose Route Frequency Provider Last Rate Last Admin   cloNIDine (CATAPRES) tablet 0.1 mg  0.1 mg Oral Once Tobb, Kardie, DO         Allergies  Allergen Reactions   Chlorthalidone Nausea Only   Lisinopril Cough    Past Medical History:  Diagnosis Date   Dizziness 09/25/2019   GERD (gastroesophageal reflux disease)    HTN (hypertension)    Obesity hypoventilation syndrome (HCC) 04/08/2014   OSA (obstructive sleep apnea) 04/08/2014   PFO (patent foramen ovale)    Sleep apnea    Screening for Secondary Hypertension: { Click here to document screening for secondary causes of HTN  :1}     05/26/2022    4:18 PM  Causes  Drugs/Herbals Screened  Renovascular HTN Screened     - Comments patient on spironolactone - would need hold before doing aldosterone/renin labs    Relevant Labs/Studies:    Latest Ref Rng & Units 05/25/2022   10:05 PM 02/27/2022    7:56 AM 02/26/2022    3:30 PM  Basic Labs  Sodium 135 - 145 mmol/L 138  138  137   Potassium 3.5 - 5.1 mmol/L 3.9  4.2  4.1   Creatinine 0.61 - 1.24 mg/dL 1.74  1.72  1.79        Latest Ref Rng & Units 05/30/2019    3:00 PM 04/29/2016   10:57 AM  Thyroid   TSH 0.450 - 4.500 uIU/mL 1.210  0.40                 07/18/2019    9:32 AM  Renovascular   Renal Artery [place] Completed Yes     Blood pressure (!) 153/98, pulse 76, height 6\' 1"  (1.854 m), weight (!) 365 lb (165.6 kg).      Essential hypertension Patient with resistant hypertension, currently on 5 medications.  Reviewed importance of compliance during visit today.  Will have him increase labetalol from 200 mg to 300 mg twice daily and continue with other medications.  He was asked to log home BP readings for next month and bring that information, along with his home cuff, to next appointment.  We will see him back in 4 weeks for follow up.    Tobacco use disorder Patient reports averaging 3 cigarettes per day.  Explained that some post-marketing information is showing semaglutide is helping with smoking cessation cravings, he should try to taper those three down until he is no longer smoking.  Patient  agreeable to trying this.     Tommy Medal PharmD CPP [facility] [address] [place], [place] [address] [phone]

## 2022-05-26 NOTE — Assessment & Plan Note (Signed)
Patient reports averaging 3 cigarettes per day.  Explained that some post-marketing information is showing semaglutide is helping with smoking cessation cravings, he should try to taper those three down until he is no longer smoking.  Patient agreeable to trying this.

## 2022-05-26 NOTE — Patient Instructions (Signed)
Return for a a follow up appointment Friday Sept 22 at 1:30 pm  Check your blood pressure at home daily and keep record of the readings.  Take your BP meds as follows:  Increase labetalol to 300 mg twice daily (take 1.5 of the 200 mg tabs until gone)  Continue with all other medications  Bring all of your meds, your BP cuff and your record of home blood pressures to your next appointment.  Exercise as you're able, try to walk approximately 30 minutes per day.  Keep salt intake to a minimum, especially watch canned and prepared boxed foods.  Eat more fresh fruits and vegetables and fewer canned items.  Avoid eating in fast food restaurants.    HOW TO TAKE YOUR BLOOD PRESSURE: Rest 5 minutes before taking your blood pressure.  Don't smoke or drink caffeinated beverages for at least 30 minutes before. Take your blood pressure before (not after) you eat. Sit comfortably with your back supported and both feet on the floor (don't cross your legs). Elevate your arm to heart level on a table or a desk. Use the proper sized cuff. It should fit smoothly and snugly around your bare upper arm. There should be enough room to slip a fingertip under the cuff. The bottom edge of the cuff should be 1 inch above the crease of the elbow. Ideally, take 3 measurements at one sitting and record the average.

## 2022-05-26 NOTE — Assessment & Plan Note (Signed)
Patient with resistant hypertension, currently on 5 medications.  Reviewed importance of compliance during visit today.  Will have him increase labetalol from 200 mg to 300 mg twice daily and continue with other medications.  He was asked to log home BP readings for next month and bring that information, along with his home cuff, to next appointment.  We will see him back in 4 weeks for follow up.

## 2022-05-28 ENCOUNTER — Encounter: Payer: Self-pay | Admitting: Physician Assistant

## 2022-06-14 ENCOUNTER — Ambulatory Visit (HOSPITAL_BASED_OUTPATIENT_CLINIC_OR_DEPARTMENT_OTHER): Payer: 59

## 2022-06-18 ENCOUNTER — Ambulatory Visit: Payer: 59 | Attending: Family Medicine

## 2022-06-18 NOTE — Progress Notes (Deleted)
Patient ID: Calvin Bailey                 DOB: [birth year]                      MRN: [id number]     HPI: Calvin Bailey is a 41 y.o. male referred by Dr. Harriet Bailey to HTN clinic. PMH is significant for  Current HTN meds:  Valsartan 160mg  BID Amlodipine 10mg  daily Hydralazine 100mg  BID Spironolactone 25mg  daily Labetalol 200mg  BID  Previously tried:  BP goal:   Family History:   Social History:   Diet:   Exercise:   Home BP readings:   Wt Readings from Last 3 Encounters:  05/26/22 (!) 365 lb (165.6 kg)  05/25/22 (!) 366 lb 2.9 oz (166.1 kg)  05/24/22 (!) 366 lb 1.6 oz (166.1 kg)   BP Readings from Last 3 Encounters:  05/26/22 (!) 153/98  05/25/22 (!) 153/96  05/24/22 (!) 170/108   Pulse Readings from Last 3 Encounters:  05/26/22 76  05/25/22 (!) 59  05/24/22 71    Renal function: CrCl cannot be calculated (Patient's most recent lab result is older than the maximum 21 days allowed.).  Past Medical History:  Diagnosis Date   Dizziness 09/25/2019   GERD (gastroesophageal reflux disease)    HTN (hypertension)    Obesity hypoventilation syndrome (HCC) 04/08/2014   OSA (obstructive sleep apnea) 04/08/2014   PFO (patent foramen ovale)    Sleep apnea     Current Outpatient Medications on File Prior to Visit  Medication Sig Dispense Refill   amLODipine (NORVASC) 10 MG tablet Take 1 tablet (10 mg total) by mouth daily. 90 tablet 3   cyclobenzaprine (FLEXERIL) 10 MG tablet One tab at bedtime for tension neck pain (Patient not taking: Reported on 05/26/2022) 30 tablet 1   gabapentin (NEURONTIN) 100 MG capsule Take 100 mg by mouth at bedtime.     hydrALAZINE (APRESOLINE) 50 MG tablet Take 1.5 tablets (75 mg total) by mouth in the morning and at bedtime. (Patient taking differently: Take 100 mg by mouth in the morning and at bedtime.) 270 tablet 3   ibuprofen (ADVIL) 200 MG tablet Take 600 mg by mouth 2 (two) times daily as needed for headache.     labetalol (NORMODYNE)  300 MG tablet Take 1 tablet (300 mg total) by mouth 2 (two) times daily. 180 tablet 1   naproxen sodium (ALEVE) 220 MG tablet Take 660 mg by mouth 2 (two) times daily as needed (headache).     Semaglutide-Weight Management 2.4 MG/0.75ML SOAJ Inject 2.4 mg into the skin once a week for 28 days. 3 mL 0   spironolactone (ALDACTONE) 25 MG tablet Take 1 tablet (25 mg total) by mouth daily. 90 tablet 3   topiramate (TOPAMAX) 50 MG tablet Take 1 tablet (50 mg total) by mouth daily. 60 tablet 2   Ubrogepant (UBRELVY) 100 MG TABS Take 100 mg by mouth as needed (Take 1 tablet at onset of migraine. may repeat an additional dose in 2 hours if needed (Do NOT exceed more than 2 tablets in 24 hours)). 10 mg at headache onset. 8 tablet 5   valsartan (DIOVAN) 160 MG tablet Take 1 tablet (160 mg total) by mouth 2 (two) times daily. 180 tablet 3   [DISCONTINUED] LISINOPRIL-HYDROCHLOROTHIAZIDE PO Take by mouth.     No current facility-administered medications on file prior to visit.    Allergies  Allergen Reactions   Chlorthalidone Nausea  Only   Lisinopril Cough     Assessment/Plan:  1. Hypertension -

## 2022-06-25 ENCOUNTER — Encounter (HOSPITAL_BASED_OUTPATIENT_CLINIC_OR_DEPARTMENT_OTHER): Payer: Self-pay | Admitting: Family Medicine

## 2022-06-25 DIAGNOSIS — N1831 Chronic kidney disease, stage 3a: Secondary | ICD-10-CM

## 2022-06-26 ENCOUNTER — Encounter (HOSPITAL_BASED_OUTPATIENT_CLINIC_OR_DEPARTMENT_OTHER): Payer: Self-pay | Admitting: Emergency Medicine

## 2022-06-26 ENCOUNTER — Emergency Department (HOSPITAL_BASED_OUTPATIENT_CLINIC_OR_DEPARTMENT_OTHER)
Admission: EM | Admit: 2022-06-26 | Discharge: 2022-06-26 | Disposition: A | Discharge status: Home / Self Care | Payer: 59 | Attending: Emergency Medicine | Admitting: Emergency Medicine

## 2022-06-26 ENCOUNTER — Emergency Department (HOSPITAL_BASED_OUTPATIENT_CLINIC_OR_DEPARTMENT_OTHER): Payer: 59

## 2022-06-26 ENCOUNTER — Other Ambulatory Visit: Payer: Self-pay

## 2022-06-26 DIAGNOSIS — R5383 Other fatigue: Secondary | ICD-10-CM | POA: Diagnosis present

## 2022-06-26 DIAGNOSIS — N289 Disorder of kidney and ureter, unspecified: Secondary | ICD-10-CM | POA: Diagnosis not present

## 2022-06-26 DIAGNOSIS — N2889 Other specified disorders of kidney and ureter: Secondary | ICD-10-CM

## 2022-06-26 DIAGNOSIS — Z79899 Other long term (current) drug therapy: Secondary | ICD-10-CM | POA: Insufficient documentation

## 2022-06-26 LAB — CBC WITH DIFFERENTIAL/PLATELET
Abs Immature Granulocytes: 0.03 10*3/uL (ref 0.00–0.07)
Basophils Absolute: 0 10*3/uL (ref 0.0–0.1)
Basophils Relative: 1 %
Eosinophils Absolute: 0.2 10*3/uL (ref 0.0–0.5)
Eosinophils Relative: 2 %
HCT: 41.7 % (ref 39.0–52.0)
Hemoglobin: 14.5 g/dL (ref 13.0–17.0)
Immature Granulocytes: 0 %
Lymphocytes Relative: 19 %
Lymphs Abs: 1.7 10*3/uL (ref 0.7–4.0)
MCH: 30 pg (ref 26.0–34.0)
MCHC: 34.8 g/dL (ref 30.0–36.0)
MCV: 86.2 fL (ref 80.0–100.0)
Monocytes Absolute: 0.7 10*3/uL (ref 0.1–1.0)
Monocytes Relative: 8 %
Neutro Abs: 6.1 10*3/uL (ref 1.7–7.7)
Neutrophils Relative %: 70 %
Platelets: 279 10*3/uL (ref 150–400)
RBC: 4.84 MIL/uL (ref 4.22–5.81)
RDW: 12.2 % (ref 11.5–15.5)
WBC: 8.7 10*3/uL (ref 4.0–10.5)
nRBC: 0 % (ref 0.0–0.2)

## 2022-06-26 LAB — COMPREHENSIVE METABOLIC PANEL
ALT: 23 U/L (ref 0–44)
AST: 14 U/L — ABNORMAL LOW (ref 15–41)
Albumin: 4.8 g/dL (ref 3.5–5.0)
Alkaline Phosphatase: 79 U/L (ref 38–126)
Anion gap: 11 (ref 5–15)
BUN: 27 mg/dL — ABNORMAL HIGH (ref 6–20)
CO2: 22 mmol/L (ref 22–32)
Calcium: 9.7 mg/dL (ref 8.9–10.3)
Chloride: 104 mmol/L (ref 98–111)
Creatinine, Ser: 1.92 mg/dL — ABNORMAL HIGH (ref 0.61–1.24)
GFR, Estimated: 44 mL/min — ABNORMAL LOW (ref >60)
Glucose, Bld: 89 mg/dL (ref 70–99)
Potassium: 4 mmol/L (ref 3.5–5.1)
Sodium: 137 mmol/L (ref 135–145)
Total Bilirubin: 0.6 mg/dL (ref 0.3–1.2)
Total Protein: 8.2 g/dL — ABNORMAL HIGH (ref 6.5–8.1)

## 2022-06-26 LAB — URINALYSIS, ROUTINE W REFLEX MICROSCOPIC
Bilirubin Urine: NEGATIVE
Glucose, UA: NEGATIVE mg/dL
Hgb urine dipstick: NEGATIVE
Ketones, ur: NEGATIVE mg/dL
Leukocytes,Ua: NEGATIVE
Nitrite: NEGATIVE
Specific Gravity, Urine: 1.028 (ref 1.005–1.030)
pH: 5.5 (ref 5.0–8.0)

## 2022-06-26 LAB — LIPASE, BLOOD: Lipase: 183 U/L — ABNORMAL HIGH (ref 11–51)

## 2022-06-26 NOTE — ED Triage Notes (Signed)
Back pain,right side and chills for about a week, pt had recently been told he had a high GFR and is worried about kidneys.

## 2022-06-26 NOTE — ED Notes (Signed)
Pt left before I was able to clarify with imagining about wether to arrive tmrw npo or full bladder.

## 2022-06-26 NOTE — ED Notes (Addendum)
Bilateral flank pain, worse on the right. No change upon urination, no blood or trouble urinating. Recently to he had stage 3 kidney.

## 2022-06-26 NOTE — ED Provider Triage Note (Signed)
Emergency Medicine Provider Triage Evaluation Note  Calvin Bailey , a 41 y.o. male  was evaluated in triage.  Pt complains of fatigue, dysgeusia, am nausea, and back pain. Found out that he has ckd 3 and is concerned this is due to his kidneys.   Review of Systems  Positive: Back pain Negative: fever  Physical Exam  BP (!) 165/88   Pulse 64   Temp 98.4 F (36.9 C) (Oral)   Resp 16   SpO2 99%  Gen:   Awake, no distress   Resp:  Normal effort  MSK:   Moves extremities without difficulty  Other:    Medical Decision Making  Medically screening exam initiated at 5:05 PM.  Appropriate orders placed.  Calvin Bailey was informed that the remainder of the evaluation will be completed by another provider, this initial triage assessment does not replace that evaluation, and the importance of remaining in the ED until their evaluation is complete.  Work up initiated.   Calvin Mail, PA-C 06/26/22 1706

## 2022-06-26 NOTE — Discharge Instructions (Addendum)
Return tomorrow at 1:00 PM - arrive by 12:45. Check in at the outpatient radiology desk for your ultrasounds. Get help right away if: You develop symptoms of ESRD. These include: Headaches. Numbness in your hands or feet. Easy bruising. Frequent hiccups. Chest pain. Shortness of breath. Lack of menstrual periods, in women. You have a fever. You are producing less urine than usual. You have pain or bleeding when you urinate or when you have a bowel movement.

## 2022-06-26 NOTE — ED Notes (Signed)
Discharge paperwork given and verbally understood. 

## 2022-06-27 ENCOUNTER — Ambulatory Visit (HOSPITAL_BASED_OUTPATIENT_CLINIC_OR_DEPARTMENT_OTHER)
Admission: RE | Admit: 2022-06-27 | Discharge: 2022-06-27 | Disposition: A | Discharge status: Home / Self Care | Payer: 59 | Source: Ambulatory Visit | Attending: Emergency Medicine | Admitting: Emergency Medicine

## 2022-06-27 ENCOUNTER — Other Ambulatory Visit (HOSPITAL_BASED_OUTPATIENT_CLINIC_OR_DEPARTMENT_OTHER): Payer: 59

## 2022-06-27 DIAGNOSIS — N289 Disorder of kidney and ureter, unspecified: Secondary | ICD-10-CM | POA: Insufficient documentation

## 2022-06-27 NOTE — ED Provider Notes (Signed)
[facility] Provider Note   CSN: [id number] Arrival date & time: 06/26/22  1531     History  Chief Complaint  Patient presents with   kidney function abnormal    [facility] is a 41 y.o. male who presents with c/o fatigue, back pain, metallic taste in mouth and morning nausea. He found out that he has renal insufficiency and is worried that he may be in renal failure. He feels that he needs to be seen by nephrology. Denies weakness, loss of bowel/bladder function or saddle anesthesia. Denies neck stiffness, headache, rash.  Denies fever or recent procedures to back. Pain is worse with movement.  HPI     Home Medications Prior to Admission medications   Medication Sig Start Date End Date Taking? Authorizing Provider  amLODipine (NORVASC) 10 MG tablet Take 1 tablet (10 mg total) by mouth daily. 02/05/22   Tobb, Godfrey Pick, DO  cyclobenzaprine (FLEXERIL) 10 MG tablet One tab at bedtime for tension neck pain Patient not taking: Reported on 05/26/2022 03/31/22   Dohmeier, Asencion Partridge, MD  gabapentin (NEURONTIN) 100 MG capsule Take 100 mg by mouth at bedtime.    [provider]  hydrALAZINE (APRESOLINE) 50 MG tablet Take 1.5 tablets (75 mg total) by mouth in the morning and at bedtime. Patient taking differently: Take 100 mg by mouth in the morning and at bedtime. 02/05/22   Tobb, Kardie, DO  ibuprofen (ADVIL) 200 MG tablet Take 600 mg by mouth 2 (two) times daily as needed for headache.    [provider]  labetalol (NORMODYNE) 300 MG tablet Take 1 tablet (300 mg total) by mouth 2 (two) times daily. 05/26/22   Tobb, Kardie, DO  naproxen sodium (ALEVE) 220 MG tablet Take 660 mg by mouth 2 (two) times daily as needed (headache).    [provider]  spironolactone (ALDACTONE) 25 MG tablet Take 1 tablet (25 mg total) by mouth daily. 02/05/22   Tobb, Kardie, DO  topiramate (TOPAMAX) 50 MG tablet Take 1 tablet (50 mg total) by mouth daily. 03/31/22  03/31/23  Dohmeier, Asencion Partridge, MD  Ubrogepant (UBRELVY) 100 MG TABS Take 100 mg by mouth as needed (Take 1 tablet at onset of migraine. may repeat an additional dose in 2 hours if needed (Do NOT exceed more than 2 tablets in 24 hours)). 10 mg at headache onset. 04/01/22   Dohmeier, Asencion Partridge, MD  valsartan (DIOVAN) 160 MG tablet Take 1 tablet (160 mg total) by mouth 2 (two) times daily. 04/30/22   Tobb, Kardie, DO  LISINOPRIL-HYDROCHLOROTHIAZIDE PO Take by mouth.  07/06/19  [provider]      Allergies    Chlorthalidone and Lisinopril    Review of Systems   Review of Systems  Physical Exam Updated Vital Signs BP (!) 149/88 (BP Location: Right Arm)   Pulse 70   Temp 98.4 F (36.9 C) (Oral)   Resp 16   SpO2 98%  Physical Exam Vitals and nursing note reviewed.  Constitutional:      General: He is not in acute distress.    Appearance: He is well-developed. He is not diaphoretic.  HENT:     Head: Normocephalic and atraumatic.  Eyes:     General: No scleral icterus.    Conjunctiva/sclera: Conjunctivae normal.  Cardiovascular:     Rate and Rhythm: Normal rate and regular rhythm.     Heart sounds: Normal heart sounds.  Pulmonary:     Effort: Pulmonary effort is normal. No respiratory distress.  Breath sounds: Normal breath sounds.  Abdominal:     General: There is no distension.     Palpations: Abdomen is soft.     Tenderness: There is no abdominal tenderness.     Comments: Obese abdomen  Musculoskeletal:     Cervical back: Normal range of motion and neck supple.     Comments: Patient appears to be in mild to moderate pain, antalgic gait noted. Lumbosacral spine area reveals no local tenderness or mass. Painful and reduced LS ROM noted. . DTR's, motor strength and sensation normal, including heel and toe gait.  Peripheral pulses are palpable.   Skin:    General: Skin is warm and dry.  Neurological:     Mental Status: He is alert.  Psychiatric:        Behavior: Behavior  normal.     ED Results / Procedures / Treatments   Labs (all labs ordered are listed, but only abnormal results are displayed) Labs Reviewed  COMPREHENSIVE METABOLIC PANEL - Abnormal; Notable for the following components:      Result Value   BUN 27 (*)    Creatinine, Ser 1.92 (*)    Total Protein 8.2 (*)    AST 14 (*)    GFR, Estimated 44 (*)    All other components within normal limits  URINALYSIS, ROUTINE W REFLEX MICROSCOPIC - Abnormal; Notable for the following components:   Protein, ur TRACE (*)    All other components within normal limits  LIPASE, BLOOD - Abnormal; Notable for the following components:   Lipase 183 (*)    All other components within normal limits  CBC WITH DIFFERENTIAL/PLATELET    EKG None  Radiology US Abdomen Complete  Addendum Date: 06/27/2022   ADDENDUM REPORT: 06/27/2022 14:26 ADDENDUM: Sidedness correction: Impression 1. Two solid echogenicity lesions in the RIGHT kidney. Recommend MRI renal protocol without and with contrast to evaluate for solid neoplasm. Outpatient evaluation recommended. Electronically Signed   By: Suzy Bouchard M.D.   On: 06/27/2022 14:26   Result Date: 06/27/2022 CLINICAL DATA:  Indeterminate renal lesions. EXAM: ABDOMEN ULTRASOUND COMPLETE COMPARISON:  None Available. FINDINGS: Gallbladder: No gallstones or wall thickening visualized. No sonographic Murphy sign noted by sonographer. Common bile duct: Diameter: Normal at 4 mm Liver: No focal lesion identified. Within normal limits in parenchymal echogenicity. Portal vein is patent on color Doppler imaging with normal direction of blood flow towards the liver. IVC: No abnormality visualized. Pancreas: Visualized portion unremarkable. Spleen: Size and appearance within normal limits. Right Kidney: Length: 11.9. Two round lesions have increased echogenicity suggesting solid lesions. Upper pole lesion measures 2.9 cm. Midpole lesion measures 1.6 cm. No hydronephrosis Left Kidney:  Length: 11.2. Echogenicity within normal limits. No mass or hydronephrosis visualized. Abdominal aorta: No aneurysm visualized. Other findings: None. IMPRESSION: 1. Two solid echogenicity lesions in the LEFT kidney. Recommend MRI renal protocol without and with contrast to evaluate for solid neoplasm. Outpatient evaluation recommended. 2. Normal liver and biliary tree Electronically Signed: By: Suzy Bouchard M.D. On: 06/27/2022 14:11   CT Renal Stone Study  Result Date: 06/26/2022 CLINICAL DATA:  Right-sided flank pain, kidney stones suspected EXAM: CT ABDOMEN AND PELVIS WITHOUT CONTRAST TECHNIQUE: Multidetector CT imaging of the abdomen and pelvis was performed following the standard protocol without IV contrast. RADIATION DOSE REDUCTION: This exam was performed according to the departmental dose-optimization program which includes automated exposure control, adjustment of the mA and/or kV according to patient size and/or use of iterative reconstruction technique. COMPARISON:  None Available. FINDINGS: Lower chest: No acute abnormality. Hepatobiliary: No solid liver abnormality is seen. No gallstones, gallbladder wall thickening, or biliary dilatation. Pancreas: Unremarkable. No pancreatic ductal dilatation or surrounding inflammatory changes. Spleen: Normal in size without significant abnormality. Adrenals/Urinary Tract: Adrenal glands are unremarkable. Partially exophytic, intermediate attenuation lesion of the superior pole of the right kidney measuring 3.1 x 2.4 cm (series 2, image 28). Partially exophytic heterogeneous appearing although fluid attenuation lesion of the lateral midportion of the right kidney measuring 1.9 x 1.6 cm (series 2, image 39). No calculi or hydronephrosis. Decompressed urinary bladder. Stomach/Bowel: Stomach is within normal limits. Appendix appears normal. No evidence of bowel wall thickening, distention, or inflammatory changes. Vascular/Lymphatic: No significant vascular  findings are present. No enlarged abdominal or pelvic lymph nodes. Reproductive: No mass or other significant abnormality. Other: No abdominal wall hernia or abnormality. No ascites. Partially imaged, fluid attenuation subcutaneous lesion of the posterior right chest, most likely a benign incidental cutaneous inclusion cyst, no further follow-up or characterization required (series 2, image 1). Musculoskeletal: No acute or significant osseous findings. IMPRESSION: 1. No urinary tract calculi or hydronephrosis. 2. Partially exophytic, intermediate attenuation lesion of the superior pole of the right kidney measuring 3.1 x 2.4 cm. Partially exophytic heterogeneous appearing although fluid attenuation lesion of the lateral midportion of the right kidney measuring 1.9 x 1.6 cm. These are incompletely characterized and may be hemorrhagic or proteinaceous cysts although solid renal mass is not excluded. Recommend initial renal ultrasound to evaluate for benign cystic character on a nonemergent, outpatient basis, or alternately multiphasic contrast enhanced CT or MRI, on a nonemergent, outpatient basis. Electronically Signed   By: Delanna Ahmadi M.D.   On: 06/26/2022 18:12    Procedures Procedures    Medications Ordered in ED Medications - No data to display  ED Course/ Medical Decision Making/ A&P                           Medical Decision Making Patient here with multiple complaints. I reviewed labs: renal fx is at baseline Cbc w/o sig abnormality. Urine w/ trace protein.  I ordered and visualized as well as interpreted a CT renal stone study.R kidney with exophytic masses.   I reviewed all findings with patient. I willl have him return for a scheduled OP abd [place] which should get kidneys and gallbladder. Referral to nephrology. All questions answered to the best of my ability.   Amount and/or Complexity of Data Reviewed Labs: ordered. Radiology: ordered.     Final Clinical Impression(s) /  ED Diagnoses Final diagnoses:  Renal insufficiency  Renal mass    Rx / DC Orders ED Discharge Orders          Ordered    US Renal  Status:  Canceled        06/26/22 2104    US Abdomen Limited RUQ/Gall Bladder  Status:  Canceled        06/26/22 2104    US Abdomen Complete        06/26/22 2143              Margarita Mail, PA-C 06/27/22 1533    Rancour, Annie Main, MD 06/27/22 [phone]

## 2022-07-01 ENCOUNTER — Ambulatory Visit (HOSPITAL_BASED_OUTPATIENT_CLINIC_OR_DEPARTMENT_OTHER): Payer: 59 | Admitting: Family

## 2022-07-01 ENCOUNTER — Encounter (HOSPITAL_BASED_OUTPATIENT_CLINIC_OR_DEPARTMENT_OTHER): Payer: Self-pay | Admitting: Family Medicine

## 2022-07-01 ENCOUNTER — Encounter (HOSPITAL_BASED_OUTPATIENT_CLINIC_OR_DEPARTMENT_OTHER): Payer: Self-pay | Admitting: Family

## 2022-07-01 ENCOUNTER — Ambulatory Visit: Payer: 59 | Admitting: Physician Assistant

## 2022-07-01 ENCOUNTER — Ambulatory Visit (HOSPITAL_BASED_OUTPATIENT_CLINIC_OR_DEPARTMENT_OTHER): Payer: 59 | Admitting: Family Medicine

## 2022-07-01 VITALS — BP 140/89 | HR 71 | Ht 73.0 in | Wt 344.7 lb

## 2022-07-01 VITALS — BP 133/80 | HR 65 | Ht 73.0 in | Wt 345.0 lb

## 2022-07-01 DIAGNOSIS — N1831 Chronic kidney disease, stage 3a: Secondary | ICD-10-CM

## 2022-07-01 DIAGNOSIS — I10 Essential (primary) hypertension: Secondary | ICD-10-CM

## 2022-07-01 DIAGNOSIS — N2889 Other specified disorders of kidney and ureter: Secondary | ICD-10-CM | POA: Diagnosis not present

## 2022-07-01 DIAGNOSIS — G4733 Obstructive sleep apnea (adult) (pediatric): Secondary | ICD-10-CM | POA: Diagnosis not present

## 2022-07-01 DIAGNOSIS — F17200 Nicotine dependence, unspecified, uncomplicated: Secondary | ICD-10-CM | POA: Diagnosis not present

## 2022-07-01 DIAGNOSIS — Z006 Encounter for examination for normal comparison and control in clinical research program: Secondary | ICD-10-CM

## 2022-07-01 DIAGNOSIS — G43011 Migraine without aura, intractable, with status migrainosus: Secondary | ICD-10-CM | POA: Diagnosis not present

## 2022-07-01 MED ORDER — VALSARTAN 160 MG PO TABS: 160.0000 mg | ORAL_TABLET | Freq: Two times a day (BID) | ORAL | 3 refills | Status: DC

## 2022-07-01 MED ORDER — MECLIZINE HCL 25 MG PO CHEW: 25.0000 mg | CHEWABLE_TABLET | Freq: Two times a day (BID) | ORAL | 2 refills | Status: DC | PRN

## 2022-07-01 MED ORDER — HYDRALAZINE HCL 100 MG PO TABS: 100.0000 mg | ORAL_TABLET | Freq: Two times a day (BID) | ORAL | 5 refills | Status: DC

## 2022-07-01 MED ORDER — AMLODIPINE BESYLATE 5 MG PO TABS: 5.0000 mg | ORAL_TABLET | Freq: Every day | ORAL | 5 refills | Status: DC

## 2022-07-01 MED ORDER — SPIRONOLACTONE 25 MG PO TABS: 25.0000 mg | ORAL_TABLET | Freq: Every day | ORAL | 5 refills | Status: DC

## 2022-07-01 MED ORDER — LABETALOL HCL 300 MG PO TABS: 300.0000 mg | ORAL_TABLET | Freq: Two times a day (BID) | ORAL | 5 refills | Status: DC

## 2022-07-01 NOTE — Patient Instructions (Signed)
  Medication Instructions:  Your physician recommends that you continue on your current medications as directed. Please refer to the Current Medication list given to you today. --If you need a refill on any your medications before your next appointment, please call your pharmacy first. If no refills are authorized on file call the office.-- Lab Work: Your physician has recommended that you have lab work today: No If you have labs (blood work) drawn today and your tests are completely normal, you will receive your results via [facility] a phone call from our staff.  Please ensure you check your voicemail in the event that you authorized detailed messages to be left on a delegated number. If you have any lab test that is abnormal or we need to change your treatment, we will call you to review the results.  Referrals/Procedures/Imaging: No  Follow-Up: Your next appointment:   Your physician recommends that you schedule a follow-up appointment in: 6-8 weeks follow-up with Dr. de [place].  You will receive a text message or e-mail with a link to a survey about your care and experience with [place] today! We would greatly appreciate your feedback!   Thanks for letting us be apart of your health journey!!  Primary Care and Sports Medicine   Dr. Arlina Robes [place]   We encourage you to activate your patient portal called "MyChart".  Sign up information is provided on this After Visit Summary.  MyChart is used to connect with patients for Virtual Visits (Telemedicine).  Patients are able to view lab/test results, encounter notes, upcoming appointments, etc.  Non-urgent messages can be sent to your provider as well. To learn more about what you can do with MyChart, please visit --  [email or web address].

## 2022-07-01 NOTE — Assessment & Plan Note (Signed)
Noted on recent imaging obtained when presenting to emergency department recently.  Recommendation after CT and ultrasound was for outpatient MRI to further characterize mass observed on kidney. We will place order today to proceed with MR imaging of kidney to further evaluate renal mass.  Pending results of imaging, may need further evaluation with specialist

## 2022-07-01 NOTE — Progress Notes (Signed)
Advanced Hypertension Clinic Initial Assessment:    Date:  07/02/2022   ID:  OM LIZOTTE, DOB Feb 02, 1981, MRN [id number]  PCP:  de [place], [name], MD  Cardiologist:  Berniece Salines, DO  Nephrologist:  Referring MD: Berniece Salines, DO   CC: Hypertension  History of Present Illness:    Calvin Bailey is a 41 y.o. male with a hx of hypertension, tobacco use, CKD, OSA, dyslipidemia here to establish care in the Advanced Hypertension Clinic.  He is a patient of Dr. Harriet Masson.  Establish with heart care 06/01/19 at which time taking carvedilol 25 BID, Amlodipine 10mg  QD, Lasix 40mg  QD. Amlodipine decreased due to edema and Losartan 50mg  added. 06/08/19 Spironolactone added and later increased. 07/12/19 Hydralazine BID initiated.07/2019 Carvedilol transitioned to Labetolol. Lost to follow up from 07/2019 - 10/2020. At visit 11/14/20 Hydralazine increased to 50mg  BID. When seen 09/2021 not taking medications, education provided. 01/2022 started on Wegovy and additionally not taking hypertensive medications.   Calvin Bailey was diagnosed with hypertension in his 96s. It has been difficult to control with previous difficulties with compliance. Does not check blood pressure at home.  he reports tobacco use - has not smoked in 23 days. Alcohol use socially. he eats at home and does follow low sodium diet. Recently changed his diet - eating fish and vegetables. He is exercising 15-30 minutes per day. Twice per week goes to boxing, otherwise walks and there is a gym at work. Works for the city of [place]. Has been motivated since learning of his CKD3. Takes his medications at 6AM and 6PM. Notes occasional palpitations associated with some "loopy" feeling which is consistent with vertigo based on his description. Prior 01/2022 CT angio head neck with no significant carotid or vertebral stenosis.   Previous antihypertensives:   Past Medical History:  Diagnosis Date   Dizziness 09/25/2019   GERD  (gastroesophageal reflux disease)    HTN (hypertension)    Obesity hypoventilation syndrome (HCC) 04/08/2014   OSA (obstructive sleep apnea) 04/08/2014   PFO (patent foramen ovale)    Sleep apnea     Past Surgical History:  Procedure Laterality Date   two knee surgeries Left 1995    Current Medications: Current Meds  Medication Sig   cyclobenzaprine (FLEXERIL) 10 MG tablet One tab at bedtime for tension neck pain   gabapentin (NEURONTIN) 100 MG capsule Take 100 mg by mouth at bedtime.   ibuprofen (ADVIL) 200 MG tablet Take 600 mg by mouth 2 (two) times daily as needed for headache.   Meclizine HCl 25 MG CHEW Chew 1 tablet (25 mg total) by mouth 2 (two) times daily as needed (dizziness).   naproxen sodium (ALEVE) 220 MG tablet Take 660 mg by mouth 2 (two) times daily as needed (headache).   topiramate (TOPAMAX) 50 MG tablet Take 1 tablet (50 mg total) by mouth daily.   Ubrogepant (UBRELVY) 100 MG TABS Take 100 mg by mouth as needed (Take 1 tablet at onset of migraine. may repeat an additional dose in 2 hours if needed (Do NOT exceed more than 2 tablets in 24 hours)). 10 mg at headache onset.   [DISCONTINUED] amLODipine (NORVASC) 10 MG tablet Take 1 tablet (10 mg total) by mouth daily.   [DISCONTINUED] hydrALAZINE (APRESOLINE) 50 MG tablet Take 1.5 tablets (75 mg total) by mouth in the morning and at bedtime. (Patient taking differently: Take 100 mg by mouth in the morning and at bedtime.)   [DISCONTINUED] labetalol (NORMODYNE) 300 MG  tablet Take 1 tablet (300 mg total) by mouth 2 (two) times daily.   [DISCONTINUED] spironolactone (ALDACTONE) 25 MG tablet Take 1 tablet (25 mg total) by mouth daily.   [DISCONTINUED] valsartan (DIOVAN) 160 MG tablet Take 1 tablet (160 mg total) by mouth 2 (two) times daily.     Allergies:   Chlorthalidone and Lisinopril   Social History   Socioeconomic History   Marital status: Single    Spouse name: Not on file   Number of children: 2   Years of  education: college   Highest education level: Some college, no degree  Occupational History   Occupation: unemployment    Fish farm manager: UNEMPLOYED    Employer: [employer]  Tobacco Use   Smoking status: Former    Packs/day: 0.10    Years: 16.00    Total pack years: 1.60    Types: Cigarettes    Quit date: 06/08/2022    Years since quitting: 0.0   Smokeless tobacco: Never  Vaping Use   Vaping Use: Never used  Substance and Sexual Activity   Alcohol use: Not Currently    Comment: sparingly    Drug use: No   Sexual activity: Yes    Partners: Female  Other Topics Concern   Not on file  Social History Narrative   Nature conservation officer   Patient is single and lives alone.   Patient is left-handed.   Patient does not drink any caffeine.   Social Determinants of Health   Financial Resource Strain: Low Risk  (07/01/2022)   Overall Financial Resource Strain (CARDIA)    Difficulty of Paying Living Expenses: Not hard at all  Food Insecurity: No Food Insecurity (07/01/2022)   Hunger Vital Sign    Worried About Running Out of Food in the Last Year: Never true    Ran Out of Food in the Last Year: Never true  Transportation Needs: No Transportation Needs (07/01/2022)   PRAPARE - Hydrologist (Medical): No    Lack of Transportation (Non-Medical): No  Physical Activity: Insufficiently Active (07/01/2022)   Exercise Vital Sign    Days of Exercise per Week: 3 days    Minutes of Exercise per Session: 30 min  Stress: No Stress Concern Present (07/01/2022)   [place]    Feeling of Stress : Only a little  Social Connections: Socially Isolated (07/01/2022)   Social Connection and Isolation Panel [NHANES]    Frequency of Communication with Friends and Family: Once a week    Frequency of Social Gatherings with Friends and Family: Once a week    Attends Religious Services: 1 to 4 times per year    Active  Member of [employer] or Organizations: No    Attends Music therapist: Never    Marital Status: Never married     Family History: The patient's family history includes CVA in his maternal uncle; Diabetes in his maternal grandmother; Heart failure in his maternal grandfather and paternal grandfather; Hyperlipidemia in his paternal grandfather; Hypertension in his brother, father, and mother; Multiple sclerosis in his mother.  ROS:   Please see the history of present illness.    All other systems reviewed and are negative.  EKGs/Labs/Other Studies Reviewed:    EKG:  No EKG today.   Echocardiogram October 23, 2020 IMPRESSIONS   1. Left ventricular ejection fraction, by estimation, is 60 to 65%. The left ventricle has normal function. The left ventricle has no  regional wall motion abnormalities. Left ventricular diastolic parameters are consistent with Grade I diastolic  dysfunction (impaired relaxation).   2. Right ventricular systolic function is normal. The right ventricular size is normal.   3. Left atrial size was moderately dilated.   4. The mitral valve is normal in structure. No evidence of mitral valve regurgitation. No evidence of mitral stenosis.   5. The aortic valve is normal in structure. Aortic valve regurgitation is  mild. No aortic stenosis is present.   6. There is mild dilatation of the ascending aorta, measuring 38 mm.   7. The inferior vena cava is normal in size with greater than 50%  respiratory variability, suggesting right atrial pressure of 3 mmHg.   Comparison(s): EF 55-60%, LVH.   FINDINGS   Left Ventricle: Left ventricular ejection fraction, by estimation, is 60  to 65%. The left ventricle has normal function. The left ventricle has no  regional wall motion abnormalities. Definity contrast agent was given IV  to delineate the left ventricular   endocardial borders. The left ventricular internal cavity size was normal  in size. There is borderline  left ventricular hypertrophy. Left  ventricular diastolic parameters are consistent with Grade I diastolic  dysfunction (impaired relaxation).   Right Ventricle: The right ventricular size is normal. No increase in  right ventricular wall thickness. Right ventricular systolic function is  normal.   Left Atrium: Left atrial size was moderately dilated.   Right Atrium: Right atrial size was normal in size.   Pericardium: There is no evidence of pericardial effusion.   Mitral Valve: The mitral valve is normal in structure. No evidence of mitral valve regurgitation. No evidence of mitral valve stenosis.   Tricuspid Valve: The tricuspid valve is normal in structure. Tricuspid valve regurgitation is not demonstrated. No evidence of tricuspid  stenosis.   Aortic Valve: The aortic valve is normal in structure. Aortic valve regurgitation is mild. No aortic stenosis is present. Aortic valve mean  gradient measures 9.0 mmHg. Aortic valve peak gradient measures 16.3 mmHg. Aortic valve area, by VTI measures 2.60   cm.   Pulmonic Valve: The pulmonic valve was normal in structure. Pulmonic valve regurgitation is not visualized. No evidence of pulmonic stenosis.   Aorta: The aortic root is normal in size and structure. There is mild  dilatation of the ascending aorta, measuring 38 mm.   Venous: The inferior vena cava is normal in size with greater than 50% respiratory variability, suggesting right atrial pressure of 3 mmHg.   IAS/Shunts: No atrial level shunt detected by color flow Doppler.  Recent Labs: 02/27/2022: Magnesium 2.1 06/26/2022: ALT 23; BUN 27; Creatinine, Ser 1.92; Hemoglobin 14.5; Platelets 279; Potassium 4.0; Sodium 137   Recent Lipid Panel    Component Value Date/Time   CHOL 136 02/27/2022 0756   CHOL 149 05/30/2019 1500   TRIG 74 02/27/2022 0756   HDL 28 (L) 02/27/2022 0756   HDL 34 (L) 05/30/2019 1500   CHOLHDL 4.9 02/27/2022 0756   VLDL 15 02/27/2022 0756   LDLCALC 93  02/27/2022 0756   LDLCALC 93 05/30/2019 1500    Physical Exam:   VS:  BP 133/80 Comment: right arm  Pulse 65   Ht 6\' 1"  (1.854 m)   Wt (!) 345 lb (156.5 kg)   BMI 45.52 kg/m  , BMI Body mass index is 45.52 kg/m. GENERAL:  Well appearing HEENT: Pupils equal round and reactive, fundi not visualized, oral mucosa unremarkable NECK:  No jugular venous distention,  waveform within normal limits, carotid upstroke brisk and symmetric, no bruits, no thyromegaly LYMPHATICS:  No cervical adenopathy LUNGS:  Clear to auscultation bilaterally HEART:  RRR.  PMI not displaced or sustained,S1 and S2 within normal limits, no S3, no S4, no clicks, no rubs, no murmurs ABD:  Flat, positive bowel sounds normal in frequency in pitch, no bruits, no rebound, no guarding, no midline pulsatile mass, no hepatomegaly, no splenomegaly EXT:  2 plus pulses throughout, no edema, no cyanosis no clubbing SKIN:  No rashes no nodules NEURO:  Cranial nerves II through XII grossly intact, motor grossly intact throughout PSYCH:  Cognitively intact, oriented to person place and time   ASSESSMENT/PLAN:    HTN - BP close to goal of <130/80. Notes trouble will LE edema, reduce Amlodipine to 5mg  QD. TID dosing of Hydralazine is difficult, will adjust to 100mg  BID. Labs within the next week for BMP, catecholamines, metanephrines, cortisol, thyroid. Hesitant to hold Spironolactone, Valsartan to assess renin-aldosterone but could be considered in the future. Continue Labetolol, Spironolactone, Valsartan. Consider simplification of regimen with combo tablet at follow up. Check in via MyChart in 1-2 weeks to reassess BP, swelling on lower dose Amlodipine.  06/2019 renal duplex with no stenosis. 01/2022 CT angio with no carotid stenosis Refer to PREP exercise program.  Heart healthy diet and regular cardiovascular exercise encouraged.    OSA - Compliant with CPAP.   Tobacco use - Has not smoked in 23 days. Continued cessation  encouraged. Recommend utilization of 1800QUITNOW. Offered health coaching, referral to psychology which he will consider.   Obesity - Weight loss via diet and exercise encouraged. Discussed the impact being overweight would have on cardiovascular risk. Referred to PREP exercise program.  CKDIII - Has been referred to nephrology by primary care. Careful titration of diuretic and antihypertensive.  Would be potential candidate for Jardiance/Farxiga for renal protective benefit. Consider at follow up.   Vertigo - Recommend PRN Meclizine OTC.  Screening for Secondary Hypertension:     05/26/2022    4:18 PM 07/02/2022   11:39 AM  Causes  Drugs/Herbals Screened Screened  Renovascular HTN Screened   Sleep Apnea  Screened  Thyroid Disease  Screened     - Comments patient on spironolactone - would need hold before doing aldosterone/renin labs   Pheochromocytoma  Screened  Cushing's Syndrome  Screened     - Comments  01/2022 CT no carotid stenosis  Coarctation of the Aorta  Screened    Relevant Labs/Studies:    Latest Ref Rng & Units 06/26/2022    5:29 PM 05/25/2022   10:05 PM 02/27/2022    7:56 AM  Basic Labs  Sodium 135 - 145 mmol/L 137  138  138   Potassium 3.5 - 5.1 mmol/L 4.0  3.9  4.2   Creatinine 0.61 - 1.24 mg/dL 1.92  1.74  1.72        Latest Ref Rng & Units 05/30/2019    3:00 PM 04/29/2016   10:57 AM  Thyroid   TSH 0.450 - 4.500 uIU/mL 1.210  0.40                 07/18/2019    9:32 AM  Renovascular   Renal Artery [place] Completed Yes    he consents to be monitored in our remote patient monitoring program through [facility].  he will track his blood pressure twice daily and understands that these trends will help [place] to adjust his medications as needed prior to his next appointment.  he   interested in enrolling in the PREP exercise and nutrition program through the [facility].    Disposition:    FU with PharmD monthly x 3 months and with Dr. Oval Linsey in 4 months.   Medication  Adjustments/Labs and Tests Ordered: Current medicines are reviewed at length with the patient today.  Concerns regarding medicines are outlined above.  Orders Placed This Encounter  Procedures   Basic metabolic panel   Thyroid Panel With TSH   Cortisol   Metanephrines, plasma   Catecholamines, fractionated, plasma   Amb Referral To Provider Referral Exercise Program (P.R.E.P)   Cantril's Ladder Assessment   Meds ordered this encounter  Medications   Meclizine HCl 25 MG CHEW    Sig: Chew 1 tablet (25 mg total) by mouth 2 (two) times daily as needed (dizziness).    Dispense:  30 tablet    Refill:  2    Order Specific Question:   Supervising Provider    Answer:   Buford Dresser [[id number]]   valsartan (DIOVAN) 160 MG tablet    Sig: Take 1 tablet (160 mg total) by mouth 2 (two) times daily.    Dispense:  180 tablet    Refill:  3    Order Specific Question:   Supervising Provider    Answer:   Maris Berger   spironolactone (ALDACTONE) 25 MG tablet    Sig: Take 1 tablet (25 mg total) by mouth daily.    Dispense:  30 tablet    Refill:  5    Order Specific Question:   Supervising Provider    Answer:   Maris Berger   labetalol (NORMODYNE) 300 MG tablet    Sig: Take 1 tablet (300 mg total) by mouth 2 (two) times daily.    Dispense:  30 tablet    Refill:  5    Order Specific Question:   Supervising Provider    Answer:   Buford Dresser [[id number]]   hydrALAZINE (APRESOLINE) 100 MG tablet    Sig: Take 1 tablet (100 mg total) by mouth in the morning and at bedtime.    Dispense:  60 tablet    Refill:  5    Order Specific Question:   Supervising Provider    Answer:   Buford Dresser [[id number]]   amLODipine (NORVASC) 5 MG tablet    Sig: Take 1 tablet (5 mg total) by mouth daily.    Dispense:  30 tablet    Refill:  5    Order Specific Question:   Supervising Provider    Answer:   Buford Dresser [[id number]]      Signed, Loel Dubonnet, NP  07/02/2022 11:40 AM    [place]

## 2022-07-01 NOTE — Research (Signed)
  Subject Name: Calvin Bailey met inclusion and exclusion criteria for the Virtual Care and Social Determinant Interventions for the management of hypertension trial.  The informed consent form, study requirements and expectations were reviewed with the subject by Dr. Oval Linsey and myself. The subject was given the opportunity to read the consent and ask questions. The subject verbalized understanding of the trial requirements.  All questions were addressed prior to the signing of the consent form. The subject agreed to participate in the trial and signed the informed consent. The informed consent was obtained prior to performance of any protocol-specific procedures for the subject.  A copy of the signed informed consent was given to the subject and a copy was placed in the subject's medical record.  Calvin Bailey was randomized to Group 1.

## 2022-07-01 NOTE — Assessment & Plan Note (Signed)
Recent labs with evidence of impaired kidney function, has been relatively stable over the past several months.  He has been referred to nephrologist, was contacted by their office, however he is still in need of scheduling appointment to establish with them. Discussed importance today of controlling for any potential risk factors which can lead to impaired kidney function or worsening kidney function.,  Specifically discussed need to avoid NSAIDs, continue to follow-up with specialists to work towards improved blood pressure control Advised on reaching out to nephrology office to schedule establishing visit

## 2022-07-01 NOTE — Assessment & Plan Note (Signed)
Did have appointment with neurology with some medication changes made, however has not had follow-up visit he does indicate that they are also looking into completing sleep study and he is still waiting for this to be arranged Reviewed note from his visit with neurology in July with recommendation for follow-up in about 2 months.  Given this recommendation, patient is overdue for follow-up with them Recommend that he reach out to neurology office in order to schedule follow-up visit regarding migraines and medication management

## 2022-07-01 NOTE — Assessment & Plan Note (Signed)
Blood pressure continues to be elevated, is elevated in office today but improved compared to recent office visits with other providers.  He is following with cardiology and does have appointment later this morning with the advanced hypertension clinic due to continued resistant hypertension.  No current issues today with chest pain or headache. At this time, recommend continuing with medications as per cardiology and any changes made at appointment later today Recommend intermittent monitoring of blood pressure at home, DASH diet He is aware of importance of improving blood pressure control as a pertains to kidney disease

## 2022-07-01 NOTE — Patient Instructions (Addendum)
Medication Instructions:  Your physician has recommended you make the following change in your medication:   Change: Hydralazine 100mg  twice daily   Change: Amlodipine 5mg  daily   Start: Meclizine 25mg  daily   We have refilled your labetalol, spironolactone and valsartan   Labwork: Please return for Lab work within the next week fasting for BMP, Catecholamines, metanephrines and cortisol level, thyroid panel. You may come to the...   [facility] (3rd floor) [address], [place], [place] [address]  Open: 8am-Noon and 1pm-4:30pm  Please ring the doorbell on the small table when you exit the elevator and the Lab Tech will come get you  [place] [facility] [address] [place], [place], [place] [address] Open: 8am-1pm, then 2pm-4:30pm   [place]- Please see attached locations sheet stapled to your lab work with address and hours.   Follow-Up: Follow up [place] AT 9:30AM    Referrals:  We have placed a referral for the prep program. They will reach out to you to get you started.    Special Instructions:  Chronic Kidney Disease, Adult Chronic kidney disease is when lasting damage happens to the kidneys slowly over a long time. The kidneys help to: Make pee (urine). Make hormones. Keep the right amount of fluids and chemicals in the body. Most often, this disease does not go away. You must take steps to help keep the kidney damage from getting worse. If steps are not taken, the kidneys might stop working forever. What are the causes? Diabetes. High blood pressure. Diseases that affect the heart and blood vessels. Other kidney diseases. Diseases of the body's disease-fighting system. A problem with the flow of pee. Infections of the organs that make pee, store it, and take it out of the body. Swelling or irritation of your blood vessels. What increases the risk? Getting older. Having someone in your family who has  kidney disease or kidney failure. Having a disease caused by genes. Taking medicines often that harm the kidneys. Being near or having contact with harmful substances. Being very overweight. Using tobacco now or in the past. What are the signs or symptoms? Feeling very tired. Having a swollen face, legs, ankles, or feet. Feeling like you may vomit or vomiting. Not feeling hungry. Being confused or not able to focus. Twitches and cramps in the leg muscles or other muscles. Dry, itchy skin. A taste of metal in your mouth. Making less pee, or making more pee. Shortness of breath. Trouble sleeping. You may also become anemic or get weak bones. Anemic means there is not enough red blood cells or hemoglobin in your blood. You may get symptoms slowly. You may not notice them until the kidney damage gets very bad. How is this treated? Often, there is no cure for this disease. Treatment can help with symptoms and help keep the disease from getting worse. You may need to: Avoid alcohol. Avoid foods that are high in salt, potassium, phosphorous, and protein. Take medicines for symptoms and to help control other conditions. Treat other problems that cause your kidney disease or make it worse. Follow these instructions at home: Medicines Take over-the-counter and prescription medicines only as told by your doctor. Do not take any new medicines, vitamins, or supplements unless your doctor says it is okay. Lifestyle Do not smoke or use any products that contain nicotine or tobacco. If you need help quitting, ask your doctor. If you drink alcohol: Limit how much you use to: 0-1  drink a day for women who are not pregnant. 0-2 drinks a day for men. Know how much alcohol is in your drink. In the [place] one drink equals one 12 oz bottle of beer (355 mL), one 5 oz glass of wine (148 mL), or one 1 oz glass of hard liquor (44 mL). Stay at a healthy weight. If you need help losing weight, ask your  doctor. General instructions  Follow instructions from your doctor about what you cannot eat or drink. Track your blood pressure at home. Tell your doctor about any changes. If you have diabetes, track your blood sugar. Exercise at least 30 minutes a day, 5 days a week. Keep your shots (vaccinations) up to date. Keep all follow-up visits. Where to find more information American Association of Kidney Patients: BombTimer.gl National Kidney Foundation: www.kidney.[place]: https://mathis.com/ Life Options: www.lifeoptions.org Kidney School: www.kidneyschool.org Contact a doctor if: Your symptoms get worse. You get new symptoms. Get help right away if: You get symptoms of end-stage kidney disease. These include: Headaches. Losing feeling in your hands or feet. Easy bruising. Having hiccups often. Chest pain. Shortness of breath. Lack of menstrual periods, in women. You have a fever. You make less pee than normal. You have pain or you bleed when you pee or poop. These symptoms may be an emergency. Get help right away. Call your local emergency services (911 in the [place] Do not wait to see if the symptoms will go away. Do not drive yourself to the hospital. Summary Chronic kidney disease is when lasting damage happens to the kidneys slowly over a long time. Causes of this disease include diabetes and high blood pressure. Often, there is no cure for this disease. Treatment can help symptoms and help keep the disease from getting worse. Treatment may involve lifestyle changes, medicines, and dialysis. This information is not intended to replace advice given to you by your health care provider. Make sure you discuss any questions you have with your health care provider. Document Revised: 12/19/2019 Document Reviewed: 12/19/2019 Elsevier Patient Education  [place].

## 2022-07-01 NOTE — Progress Notes (Signed)
    Procedures performed today:    None.  Independent interpretation of notes and tests performed by another provider:   None.  Brief History, Exam, Impression, and Recommendations:    BP (!) 140/89   Pulse 71   Ht 6\' 1"  (1.854 m)   Wt (!) 344 lb 11.2 oz (156.4 kg)   SpO2 96%   BMI 45.48 kg/m   Primary hypertension Blood pressure continues to be elevated, is elevated in office today but improved compared to recent office visits with other providers.  He is following with cardiology and does have appointment later this morning with the advanced hypertension clinic due to continued resistant hypertension.  No current issues today with chest pain or headache. At this time, recommend continuing with medications as per cardiology and any changes made at appointment later today Recommend intermittent monitoring of blood pressure at home, DASH diet He is aware of importance of improving blood pressure control as a pertains to kidney disease  Chronic kidney disease, stage 3a ([place]) Recent labs with evidence of impaired kidney function, has been relatively stable over the past several months.  He has been referred to nephrologist, was contacted by their office, however he is still in need of scheduling appointment to establish with them. Discussed importance today of controlling for any potential risk factors which can lead to impaired kidney function or worsening kidney function.,  Specifically discussed need to avoid NSAIDs, continue to follow-up with specialists to work towards improved blood pressure control Advised on reaching out to nephrology office to schedule establishing visit  Renal mass Noted on recent imaging obtained when presenting to emergency department recently.  Recommendation after CT and ultrasound was for outpatient MRI to further characterize mass observed on kidney. We will place order today to proceed with MR imaging of kidney to further evaluate renal mass.  Pending  results of imaging, may need further evaluation with specialist  Intractable migraine without aura and with status migrainosus Did have appointment with neurology with some medication changes made, however has not had follow-up visit he does indicate that they are also looking into completing sleep study and he is still waiting for this to be arranged Reviewed note from his visit with neurology in July with recommendation for follow-up in about 2 months.  Given this recommendation, patient is overdue for follow-up with them Recommend that he reach out to neurology office in order to schedule follow-up visit regarding migraines and medication management  Return in about 6 weeks (around 08/12/2022).   ___________________________________________ Calvin Kumpf de [place], MD, ABFM, CAQSM [facility] [place]

## 2022-07-02 ENCOUNTER — Encounter (HOSPITAL_BASED_OUTPATIENT_CLINIC_OR_DEPARTMENT_OTHER): Payer: Self-pay | Admitting: Family

## 2022-07-03 ENCOUNTER — Telehealth: Payer: Self-pay

## 2022-07-03 NOTE — Telephone Encounter (Signed)
Call to pt explained program to pt.  Interested in participating. Would need an evening class.  Explained next one will be in Jan of 2024. If I can get him in sooner, will call him before then.

## 2022-07-27 ENCOUNTER — Ambulatory Visit (HOSPITAL_BASED_OUTPATIENT_CLINIC_OR_DEPARTMENT_OTHER): Payer: 59 | Admitting: Cardiovascular Disease

## 2022-08-03 ENCOUNTER — Ambulatory Visit: Payer: 59 | Admitting: Adult Health

## 2022-08-03 NOTE — Progress Notes (Deleted)
Patient presents to clinic today to establish care. He is a 41 year old male who  has a past medical history of Dizziness (09/25/2019), GERD (gastroesophageal reflux disease), HTN (hypertension), Obesity hypoventilation syndrome ([place]) (04/08/2014), OSA (obstructive sleep apnea) (04/08/2014), PFO (patent foramen ovale), and Sleep apnea.   Acute Concerns: Establish Care   Chronic Issues: HTN   Health Maintenance: Dental -- Vision -- Immunizations -- Colonoscopy -- Mammogram -- PAP --  Bone Density --    Past Medical History:  Diagnosis Date   Dizziness 09/25/2019   GERD (gastroesophageal reflux disease)    HTN (hypertension)    Obesity hypoventilation syndrome ([facility]) 04/08/2014   OSA (obstructive sleep apnea) 04/08/2014   PFO (patent foramen ovale)    Sleep apnea     Past Surgical History:  Procedure Laterality Date   two knee surgeries Left 1995    Current Outpatient Medications on File Prior to Visit  Medication Sig Dispense Refill   amLODipine (NORVASC) 5 MG tablet Take 1 tablet (5 mg total) by mouth daily. 30 tablet 5   cyclobenzaprine (FLEXERIL) 10 MG tablet One tab at bedtime for tension neck pain 30 tablet 1   gabapentin (NEURONTIN) 100 MG capsule Take 100 mg by mouth at bedtime.     hydrALAZINE (APRESOLINE) 100 MG tablet Take 1 tablet (100 mg total) by mouth in the morning and at bedtime. 60 tablet 5   ibuprofen (ADVIL) 200 MG tablet Take 600 mg by mouth 2 (two) times daily as needed for headache.     labetalol (NORMODYNE) 300 MG tablet Take 1 tablet (300 mg total) by mouth 2 (two) times daily. 30 tablet 5   Meclizine HCl 25 MG CHEW Chew 1 tablet (25 mg total) by mouth 2 (two) times daily as needed (dizziness). 30 tablet 2   naproxen sodium (ALEVE) 220 MG tablet Take 660 mg by mouth 2 (two) times daily as needed (headache).     spironolactone (ALDACTONE) 25 MG tablet Take 1 tablet (25 mg total) by mouth daily. 30 tablet 5   topiramate (TOPAMAX) 50 MG tablet  Take 1 tablet (50 mg total) by mouth daily. 60 tablet 2   Ubrogepant (UBRELVY) 100 MG TABS Take 100 mg by mouth as needed (Take 1 tablet at onset of migraine. may repeat an additional dose in 2 hours if needed (Do NOT exceed more than 2 tablets in 24 hours)). 10 mg at headache onset. 8 tablet 5   valsartan (DIOVAN) 160 MG tablet Take 1 tablet (160 mg total) by mouth 2 (two) times daily. 180 tablet 3   [DISCONTINUED] LISINOPRIL-HYDROCHLOROTHIAZIDE PO Take by mouth.     No current facility-administered medications on file prior to visit.    Allergies  Allergen Reactions   Chlorthalidone Nausea Only   Lisinopril Cough    Family History  Problem Relation Age of Onset   Hypertension Mother    Multiple sclerosis Mother    Hypertension Father    Hypertension Brother    Hyperlipidemia Paternal Grandfather    Heart failure Paternal Grandfather    Diabetes Maternal Grandmother    Heart failure Maternal Grandfather    CVA Maternal Uncle     Social History   Socioeconomic History   Marital status: Single    Spouse name: Not on file   Number of children: 2   Years of education: college   Highest education level: Some college, no degree  Occupational History   Occupation: unemployment    Fish farm manager: UNEMPLOYED  Employer: [employer]  Tobacco Use   Smoking status: Former    Packs/day: 0.10    Years: 16.00    Total pack years: 1.60    Types: Cigarettes    Quit date: 06/08/2022    Years since quitting: 0.1   Smokeless tobacco: Never  Vaping Use   Vaping Use: Never used  Substance and Sexual Activity   Alcohol use: Not Currently    Comment: sparingly    Drug use: No   Sexual activity: Yes    Partners: Female  Other Topics Concern   Not on file  Social History Narrative   Nature conservation officer   Patient is single and lives alone.   Patient is left-handed.   Patient does not drink any caffeine.   Social Determinants of Health   Financial Resource Strain: Low Risk   (07/01/2022)   Overall Financial Resource Strain (CARDIA)    Difficulty of Paying Living Expenses: Not hard at all  Food Insecurity: No Food Insecurity (07/01/2022)   Hunger Vital Sign    Worried About Running Out of Food in the Last Year: Never true    Ran Out of Food in the Last Year: Never true  Transportation Needs: No Transportation Needs (07/01/2022)   PRAPARE - Hydrologist (Medical): No    Lack of Transportation (Non-Medical): No  Physical Activity: Insufficiently Active (07/01/2022)   Exercise Vital Sign    Days of Exercise per Week: 3 days    Minutes of Exercise per Session: 30 min  Stress: No Stress Concern Present (07/01/2022)   [place]    Feeling of Stress : Only a little  Social Connections: Socially Isolated (07/01/2022)   Social Connection and Isolation Panel [NHANES]    Frequency of Communication with Friends and Family: Once a week    Frequency of Social Gatherings with Friends and Family: Once a week    Attends Religious Services: 1 to 4 times per year    Active Member of [employer] or Organizations: No    Attends Archivist Meetings: Never    Marital Status: Never married  Human resources officer Violence: Not on file    ROS  There were no vitals taken for this visit.  Physical Exam  Recent Results (from the past 2160 hour(s))  CBC with Differential     Status: None   Collection Time: 05/25/22 10:05 PM  Result Value Ref Range   WBC 8.5 4.0 - 10.5 K/uL   RBC 4.72 4.22 - 5.81 MIL/uL   Hemoglobin 14.0 13.0 - 17.0 g/dL   HCT 40.6 39.0 - 52.0 %   MCV 86.0 80.0 - 100.0 fL   MCH 29.7 26.0 - 34.0 pg   MCHC 34.5 30.0 - 36.0 g/dL   RDW 12.3 11.5 - 15.5 %   Platelets 239 150 - 400 K/uL   nRBC 0.0 0.0 - 0.2 %   Neutrophils Relative % 64 %   Neutro Abs 5.5 1.7 - 7.7 K/uL   Lymphocytes Relative 24 %   Lymphs Abs 2.0 0.7 - 4.0 K/uL   Monocytes Relative 7 %   Monocytes  Absolute 0.6 0.1 - 1.0 K/uL   Eosinophils Relative 3 %   Eosinophils Absolute 0.3 0.0 - 0.5 K/uL   Basophils Relative 1 %   Basophils Absolute 0.0 0.0 - 0.1 K/uL   Immature Granulocytes 1 %   Abs Immature Granulocytes 0.04 0.00 - 0.07 K/uL  Comment: Performed at [facility], [address], [place], [place] [address]  Comprehensive metabolic panel     Status: Abnormal   Collection Time: 05/25/22 10:05 PM  Result Value Ref Range   Sodium 138 135 - 145 mmol/L   Potassium 3.9 3.5 - 5.1 mmol/L   Chloride 107 98 - 111 mmol/L   CO2 22 22 - 32 mmol/L   Glucose, Bld 91 70 - 99 mg/dL    Comment: Glucose reference range applies only to samples taken after fasting for at least 8 hours.   BUN 23 (H) 6 - 20 mg/dL   Creatinine, Ser 1.74 (H) 0.61 - 1.24 mg/dL   Calcium 9.7 8.9 - 10.3 mg/dL   Total Protein 7.4 6.5 - 8.1 g/dL   Albumin 4.5 3.5 - 5.0 g/dL   AST 20 15 - 41 U/L   ALT 32 0 - 44 U/L   Alkaline Phosphatase 75 38 - 126 U/L   Total Bilirubin 0.5 0.3 - 1.2 mg/dL   GFR, Estimated 50 (L) >60 mL/min    Comment: (NOTE) Calculated using the CKD-EPI Creatinine Equation (2021)    Anion gap 9 5 - 15    Comment: Performed at [facility], [address] [place], [place] [address]  Comprehensive metabolic panel     Status: Abnormal   Collection Time: 06/26/22  5:29 PM  Result Value Ref Range   Sodium 137 135 - 145 mmol/L   Potassium 4.0 3.5 - 5.1 mmol/L   Chloride 104 98 - 111 mmol/L   CO2 22 22 - 32 mmol/L   Glucose, Bld 89 70 - 99 mg/dL    Comment: Glucose reference range applies only to samples taken after fasting for at least 8 hours.   BUN 27 (H) 6 - 20 mg/dL   Creatinine, Ser 1.92 (H) 0.61 - 1.24 mg/dL   Calcium 9.7 8.9 - 10.3 mg/dL   Total Protein 8.2 (H) 6.5 - 8.1 g/dL   Albumin 4.8 3.5 - 5.0 g/dL   AST 14 (L) 15 - 41 U/L   ALT 23 0 - 44 U/L   Alkaline Phosphatase 79 38 - 126 U/L   Total Bilirubin 0.6 0.3 - 1.2 mg/dL   GFR, Estimated  44 (L) >60 mL/min    Comment: (NOTE) Calculated using the CKD-EPI Creatinine Equation (2021)    Anion gap 11 5 - 15    Comment: Performed at [facility], [address] [place],  [address]  CBC with Differential     Status: None   Collection Time: 06/26/22  5:29 PM  Result Value Ref Range   WBC 8.7 4.0 - 10.5 K/uL   RBC 4.84 4.22 - 5.81 MIL/uL   Hemoglobin 14.5 13.0 - 17.0 g/dL   HCT 41.7 39.0 - 52.0 %   MCV 86.2 80.0 - 100.0 fL   MCH 30.0 26.0 - 34.0 pg   MCHC 34.8 30.0 - 36.0 g/dL   RDW 12.2 11.5 - 15.5 %   Platelets 279 150 - 400 K/uL   nRBC 0.0 0.0 - 0.2 %   Neutrophils Relative % 70 %   Neutro Abs 6.1 1.7 - 7.7 K/uL   Lymphocytes Relative 19 %   Lymphs Abs 1.7 0.7 - 4.0 K/uL   Monocytes Relative 8 %   Monocytes Absolute 0.7 0.1 - 1.0 K/uL   Eosinophils Relative 2 %   Eosinophils Absolute 0.2 0.0 - 0.5 K/uL   Basophils Relative 1 %   Basophils  Absolute 0.0 0.0 - 0.1 K/uL   Immature Granulocytes 0 %   Abs Immature Granulocytes 0.03 0.00 - 0.07 K/uL    Comment: Performed at [facility], [address] [place], [place] [address]  Urinalysis, Routine w reflex microscopic Urine, Clean Catch     Status: Abnormal   Collection Time: 06/26/22  5:29 PM  Result Value Ref Range   Color, Urine YELLOW YELLOW   APPearance CLEAR CLEAR   Specific Gravity, Urine 1.028 1.005 - 1.030   pH 5.5 5.0 - 8.0   Glucose, UA NEGATIVE NEGATIVE mg/dL   Hgb urine dipstick NEGATIVE NEGATIVE   Bilirubin Urine NEGATIVE NEGATIVE   Ketones, ur NEGATIVE NEGATIVE mg/dL   Protein, ur TRACE (A) NEGATIVE mg/dL   Nitrite NEGATIVE NEGATIVE   Leukocytes,Ua NEGATIVE NEGATIVE    Comment: Performed at [facility], [place], [place] [address]  Lipase, blood     Status: Abnormal   Collection Time: 06/26/22  5:29 PM  Result Value Ref Range   Lipase 183 (H) 11 - 51 U/L    Comment: Performed at [facility],  [address] [place], [place] [address]    Assessment/Plan: No problem-specific Assessment & Plan notes found for this encounter.

## 2022-08-06 ENCOUNTER — Ambulatory Visit: Payer: 59

## 2022-08-06 NOTE — Progress Notes (Deleted)
Patient ID: RANARD HARTE                 DOB: 09-Nov-1980                      MRN: [id number]      HPI: GALEN RUSSMAN is a 41 y.o. male referred by Laurann Montana to Adv HTN Clinic. PMH is significant for HTN, CHF, smoking, OSA, obesity, and HLD. This is patient's first PharmD visit to HTN clinic.  Current HTN meds:  Amlodipine 5mg  daily Hydralazine 100mg  BID Valsartan 160mg  BID Spironolactone 25mg  daily Labetalol 100mg  BID  Previously tried:  BP goal:   Family History:   Social History:   Diet:   Exercise:   Home BP readings:   Wt Readings from Last 3 Encounters:  07/01/22 (!) 345 lb (156.5 kg)  07/01/22 (!) 344 lb 11.2 oz (156.4 kg)  05/26/22 (!) 365 lb (165.6 kg)   BP Readings from Last 3 Encounters:  07/01/22 133/80  07/01/22 (!) 140/89  06/26/22 (!) 149/88   Pulse Readings from Last 3 Encounters:  07/01/22 65  07/01/22 71  06/26/22 70    Renal function: CrCl cannot be calculated (Patient's most recent lab result is older than the maximum 21 days allowed.).  Past Medical History:  Diagnosis Date   Dizziness 09/25/2019   GERD (gastroesophageal reflux disease)    HTN (hypertension)    Obesity hypoventilation syndrome ([facility]) 04/08/2014   OSA (obstructive sleep apnea) 04/08/2014   PFO (patent foramen ovale)    Sleep apnea     Current Outpatient Medications on File Prior to Visit  Medication Sig Dispense Refill   amLODipine (NORVASC) 5 MG tablet Take 1 tablet (5 mg total) by mouth daily. 30 tablet 5   cyclobenzaprine (FLEXERIL) 10 MG tablet One tab at bedtime for tension neck pain 30 tablet 1   gabapentin (NEURONTIN) 100 MG capsule Take 100 mg by mouth at bedtime.     hydrALAZINE (APRESOLINE) 100 MG tablet Take 1 tablet (100 mg total) by mouth in the morning and at bedtime. 60 tablet 5   ibuprofen (ADVIL) 200 MG tablet Take 600 mg by mouth 2 (two) times daily as needed for headache.     labetalol (NORMODYNE) 300 MG tablet Take 1 tablet (300 mg  total) by mouth 2 (two) times daily. 30 tablet 5   Meclizine HCl 25 MG CHEW Chew 1 tablet (25 mg total) by mouth 2 (two) times daily as needed (dizziness). 30 tablet 2   naproxen sodium (ALEVE) 220 MG tablet Take 660 mg by mouth 2 (two) times daily as needed (headache).     spironolactone (ALDACTONE) 25 MG tablet Take 1 tablet (25 mg total) by mouth daily. 30 tablet 5   topiramate (TOPAMAX) 50 MG tablet Take 1 tablet (50 mg total) by mouth daily. 60 tablet 2   Ubrogepant (UBRELVY) 100 MG TABS Take 100 mg by mouth as needed (Take 1 tablet at onset of migraine. may repeat an additional dose in 2 hours if needed (Do NOT exceed more than 2 tablets in 24 hours)). 10 mg at headache onset. 8 tablet 5   valsartan (DIOVAN) 160 MG tablet Take 1 tablet (160 mg total) by mouth 2 (two) times daily. 180 tablet 3   [DISCONTINUED] LISINOPRIL-HYDROCHLOROTHIAZIDE PO Take by mouth.     No current facility-administered medications on file prior to visit.    Allergies  Allergen Reactions   Chlorthalidone Nausea Only  Lisinopril Cough     Assessment/Plan:  1. Hypertension -

## 2022-08-24 ENCOUNTER — Ambulatory Visit: Payer: 59 | Attending: Cardiovascular Disease | Admitting: Pharmacist Clinician (PhC)/ Clinical Pharmacy Specialist

## 2022-08-24 ENCOUNTER — Encounter: Payer: Self-pay | Admitting: Pharmacist Clinician (PhC)/ Clinical Pharmacy Specialist

## 2022-08-24 ENCOUNTER — Other Ambulatory Visit (HOSPITAL_BASED_OUTPATIENT_CLINIC_OR_DEPARTMENT_OTHER): Payer: Self-pay

## 2022-08-24 VITALS — BP 154/84 | HR 72

## 2022-08-24 DIAGNOSIS — I10 Essential (primary) hypertension: Secondary | ICD-10-CM

## 2022-08-24 MED ORDER — HYDRALAZINE HCL 100 MG PO TABS: 100.0000 mg | ORAL_TABLET | Freq: Three times a day (TID) | ORAL | 3 refills | Status: DC

## 2022-08-24 MED ORDER — WEGOVY 1 MG/0.5ML ~~LOC~~ SOAJ
1.0000 mg | SUBCUTANEOUS | 1 refills | Status: DC
  Filled 2022-08-24: qty 2, 28d supply, fill #0
  Filled 2022-10-18 (×2): qty 2, 28d supply, fill #1

## 2022-08-24 MED ORDER — LABETALOL HCL 300 MG PO TABS: 300.0000 mg | ORAL_TABLET | Freq: Two times a day (BID) | ORAL | 3 refills | Status: DC

## 2022-08-24 NOTE — Assessment & Plan Note (Addendum)
Patient stopped Wegovy secondary to constipation concerns several weeks ago.  Reviewed options for laxatives, as patient would like to get back on Wegovy.  Will have him re-start at 1 mg weekly, assuming he can find this, and suggested he stay at that dose for at least a month before trying the 1.7 mg dose again.  Has lost over 50 pounds since beginning of 2023

## 2022-08-24 NOTE — Progress Notes (Signed)
Office Visit    Patient Name: Calvin Bailey Date of Encounter: 08/24/2022  Primary Care Provider:  [place], Blondell Reveal, MD Primary Cardiologist:  Berniece Salines, DO  Chief Complaint    Hypertension - Advanced hypertension clinic  Past Medical History   CHF 6/23 echo EF 60-65%, grade II diastolic dysfunction  HLD 6/23 LDL 93, no medications  CKD Stage 4; 06/26/22 SCr 1.92, GFR 44  migraine On topiramate daily, Ubrelvy for episodes  obesity 10/23 BMI 45.53 (73", 345 lb) Wegovy??, previous BMI 52.6  OSA     Allergies  Allergen Reactions   Chlorthalidone Nausea Only   Lisinopril Cough    History of Present Illness    Patient was referred to the Advanced Hypertension Clinic by Dr. Harriet Masson after an office visit BP of 164/94 despite being on 5 medications.  He was in the ED in late August for hypertension, and I saw him shortly thereafter.  Most recently he was seen by Laurann Montana, at which time his pressure was much improved to 133/80.  Unfortunately he was having trouble with lower extremity edema, so the amlodipine was decreased to 5 mg daily and he was enrolled in our [facility] RPM study.  He was asked to get labs to check catecholamines, metanephrines, cortisol and thyroid function.  Renin/aldosterone was not ordered because he is already on spironolactone, and would prefer not to stop it at this time.  Today he is back for follow up.  Has not been able to check BP at home as the Vivify cuff is too small.  His weight is down just over 50 pounds since the beginning of the year, and was on Wegovy until a few weeks ago.  The 1.7 mg dose was causing some constipation, so he discontinued.  Today he worries that he is gaining some of the weight back.    Blood Pressure Goal:  130/80  Current Medications:   amlodipine 5 mg qd, hydralazine 100 mg bid, labetalol 300 mg bid, spironolactone 25 mg qd, valsartan 160 mg bid  Family Hx:  father recent diagnosis of hypertension, mother no htn,  brothers fine; 2 young children   Social Hx:  smokes about 3-5 cig/day; rare alcohol, no regular caffeine   Diet:  cut back on portions with Wegovy down 34 pounds since starting   Exercise: 3 times each week, cardio weights, walking; walks regularly at work  Home BP readings: none with him today     Medication Intolerances:  Lisinopril - cough  Chlorthalidone - nausea  Amlodipine 10 mg - edema  Accessory Clinical Findings    Lab Results  Component Value Date   CREATININE 1.92 (H) 06/26/2022   BUN 27 (H) 06/26/2022   NA 137 06/26/2022   K 4.0 06/26/2022   CL 104 06/26/2022   CO2 22 06/26/2022   Lab Results  Component Value Date   ALT 23 06/26/2022   AST 14 (L) 06/26/2022   ALKPHOS 79 06/26/2022   BILITOT 0.6 06/26/2022   Lab Results  Component Value Date   HGBA1C 5.4 02/27/2022    Home Medications    Current Outpatient Medications  Medication Sig Dispense Refill   hydrALAZINE (APRESOLINE) 100 MG tablet Take 1 tablet (100 mg total) by mouth 3 (three) times daily. 180 tablet 3   Semaglutide-Weight Management (WEGOVY) 1 MG/0.5ML SOAJ Inject 1 mg into the skin every 7 (seven) days. 2 mL 1   cyclobenzaprine (FLEXERIL) 10 MG tablet One tab at bedtime for  tension neck pain 30 tablet 1   gabapentin (NEURONTIN) 100 MG capsule Take 100 mg by mouth at bedtime.     ibuprofen (ADVIL) 200 MG tablet Take 600 mg by mouth 2 (two) times daily as needed for headache.     labetalol (NORMODYNE) 300 MG tablet Take 1 tablet (300 mg total) by mouth 2 (two) times daily. 180 tablet 3   Meclizine HCl 25 MG CHEW Chew 1 tablet (25 mg total) by mouth 2 (two) times daily as needed (dizziness). 30 tablet 2   naproxen sodium (ALEVE) 220 MG tablet Take 660 mg by mouth 2 (two) times daily as needed (headache).     spironolactone (ALDACTONE) 25 MG tablet Take 1 tablet (25 mg total) by mouth daily. 30 tablet 5   topiramate (TOPAMAX) 50 MG tablet Take 1 tablet (50 mg total) by mouth daily. 60 tablet 2    Ubrogepant (UBRELVY) 100 MG TABS Take 100 mg by mouth as needed (Take 1 tablet at onset of migraine. may repeat an additional dose in 2 hours if needed (Do NOT exceed more than 2 tablets in 24 hours)). 10 mg at headache onset. 8 tablet 5   valsartan (DIOVAN) 160 MG tablet Take 1 tablet (160 mg total) by mouth 2 (two) times daily. 180 tablet 3   No current facility-administered medications for this visit.     Assessment & Plan    HYPERTENSION CONTROL Vitals:   08/24/22 1421 08/24/22 1425  BP: (!) 154/80 (!) 154/84    The patient's blood pressure is elevated above target today.  In order to address the patient's elevated BP: A current anti-hypertensive medication was adjusted today.      Essential hypertension Patient with resistant hypertension, unable to use research cuff at home as it does not fit upper arm.  Reviewed information about using on his forearm, he notes the few times he tried, the readings were all well over 782 systolic, which he feels are inaccurate.  Will discontinue amlodipine for now because of lower extremity edema.  If edema doesn't resolve in the next couple of months, will consider re-starting.  In the meantime, will increase hydralazine to 100 mg three times daily - he can take the middle dose with lunch, as his morning dose is around 5-6 am.  Will see him back in one month for follow up.  He will try to get to lab this Friday morning for secondary hypertension labs ordered at last visit.   Morbid obesity due to excess calories [facility]) Patient stopped Wegovy secondary to constipation concerns several weeks ago.  Reviewed options for laxatives, as patient would like to get back on Wegovy.  Will have him re-start at 1 mg weekly, assuming he can find this, and suggested he stay at that dose for at least a month before trying the 1.7 mg dose again.  Has lost over 50 pounds since beginning of [place] PharmD [facility] [place]  [address] [place], [place] [address] [phone]

## 2022-08-24 NOTE — Assessment & Plan Note (Signed)
Patient with resistant hypertension, unable to use research cuff at home as it does not fit upper arm.  Reviewed information about using on his forearm, he notes the few times he tried, the readings were all well over 022 systolic, which he feels are inaccurate.  Will discontinue amlodipine for now because of lower extremity edema.  If edema doesn't resolve in the next couple of months, will consider re-starting.  In the meantime, will increase hydralazine to 100 mg three times daily - he can take the middle dose with lunch, as his morning dose is around 5-6 am.  Will see him back in one month for follow up.  He will try to get to lab this Friday morning for secondary hypertension labs ordered at last visit.

## 2022-08-24 NOTE — Patient Instructions (Signed)
Return for a a follow up appointment Friday January 5 at 9:30 am  Go to the lab on Friday, first thing in the morning, around 8 am.  Fasting.  Take your BP meds as follows:  Stop amlodipine for now  Increase hydralazine to 100 mg three times daily - take mid-day dose with your lunch  Re-start the Wegovy at 1 mg weekly - just double check with kidney doctor that this is okay  Bring all of your meds, your BP cuff and your record of home blood pressures to your next appointment.  Exercise as you're able, try to walk approximately 30 minutes per day.  Keep salt intake to a minimum, especially watch canned and prepared boxed foods.  Eat more fresh fruits and vegetables and fewer canned items.  Avoid eating in fast food restaurants.    HOW TO TAKE YOUR BLOOD PRESSURE: Rest 5 minutes before taking your blood pressure.  Don't smoke or drink caffeinated beverages for at least 30 minutes before. Take your blood pressure before (not after) you eat. Sit comfortably with your back supported and both feet on the floor (don't cross your legs). Elevate your arm to heart level on a table or a desk. Use the proper sized cuff. It should fit smoothly and snugly around your bare upper arm. There should be enough room to slip a fingertip under the cuff. The bottom edge of the cuff should be 1 inch above the crease of the elbow. Ideally, take 3 measurements at one sitting and record the average.

## 2022-08-25 ENCOUNTER — Encounter (HOSPITAL_BASED_OUTPATIENT_CLINIC_OR_DEPARTMENT_OTHER): Payer: Self-pay

## 2022-08-25 ENCOUNTER — Other Ambulatory Visit (HOSPITAL_BASED_OUTPATIENT_CLINIC_OR_DEPARTMENT_OTHER): Payer: Self-pay

## 2022-08-26 ENCOUNTER — Other Ambulatory Visit (HOSPITAL_BASED_OUTPATIENT_CLINIC_OR_DEPARTMENT_OTHER): Payer: Self-pay

## 2022-08-27 ENCOUNTER — Other Ambulatory Visit (HOSPITAL_BASED_OUTPATIENT_CLINIC_OR_DEPARTMENT_OTHER): Payer: Self-pay

## 2022-08-28 ENCOUNTER — Ambulatory Visit
Admission: RE | Admit: 2022-08-28 | Discharge: 2022-08-28 | Disposition: A | Discharge status: Home / Self Care | Payer: 59 | Source: Ambulatory Visit | Attending: Family Medicine | Admitting: Family Medicine

## 2022-08-28 DIAGNOSIS — N2889 Other specified disorders of kidney and ureter: Secondary | ICD-10-CM

## 2022-08-28 MED ORDER — GADOPICLENOL 0.5 MMOL/ML IV SOLN
10.0000 mL | Freq: Once | INTRAVENOUS | Status: AC | PRN
  Administered 2022-08-28: 10 mL via INTRAVENOUS

## 2022-08-31 ENCOUNTER — Other Ambulatory Visit (HOSPITAL_BASED_OUTPATIENT_CLINIC_OR_DEPARTMENT_OTHER): Payer: Self-pay

## 2022-08-31 ENCOUNTER — Other Ambulatory Visit (HOSPITAL_BASED_OUTPATIENT_CLINIC_OR_DEPARTMENT_OTHER): Payer: Self-pay | Admitting: Family Medicine

## 2022-08-31 DIAGNOSIS — N2889 Other specified disorders of kidney and ureter: Secondary | ICD-10-CM

## 2022-09-01 ENCOUNTER — Telehealth: Payer: Self-pay | Admitting: Pharmacist Clinician (PhC)/ Clinical Pharmacy Specialist

## 2022-09-01 NOTE — Telephone Encounter (Signed)
NOIBBC PA approved to 12/25/2022  Key:  [id number]

## 2022-09-03 ENCOUNTER — Other Ambulatory Visit (HOSPITAL_BASED_OUTPATIENT_CLINIC_OR_DEPARTMENT_OTHER): Payer: Self-pay

## 2022-09-03 ENCOUNTER — Ambulatory Visit (HOSPITAL_BASED_OUTPATIENT_CLINIC_OR_DEPARTMENT_OTHER): Payer: 59 | Admitting: Family Medicine

## 2022-09-08 ENCOUNTER — Other Ambulatory Visit (HOSPITAL_BASED_OUTPATIENT_CLINIC_OR_DEPARTMENT_OTHER): Payer: Self-pay | Admitting: Family Medicine

## 2022-09-08 DIAGNOSIS — N2889 Other specified disorders of kidney and ureter: Secondary | ICD-10-CM

## 2022-09-23 ENCOUNTER — Ambulatory Visit (HOSPITAL_BASED_OUTPATIENT_CLINIC_OR_DEPARTMENT_OTHER): Payer: 59 | Admitting: Family Medicine

## 2022-10-01 ENCOUNTER — Ambulatory Visit: Payer: 59 | Attending: Cardiology

## 2022-10-01 NOTE — Progress Notes (Deleted)
Office Visit    Patient Name: Calvin Bailey Date of Encounter: 10/01/2022  Primary Care Provider:  [place], Blondell Reveal, MD Primary Cardiologist:  Berniece Salines, DO  Chief Complaint    Hypertension - Advanced hypertension clinic  Past Medical History   CHF 6/23 echo EF 60-65%, grade II diastolic dysfunction  HLD 6/23 LDL 93, no medications  CKD Stage 4; 06/26/22 SCr 1.92, GFR 44  migraine On topiramate daily, Ubrelvy for episodes  obesity 10/23 BMI 45.53 (73", 345 lb) Wegovy??, previous BMI 52.6  OSA     Allergies  Allergen Reactions   Chlorthalidone Nausea Only   Lisinopril Cough    History of Present Illness    Patient was referred to the [facility] by Dr. Harriet Masson after an office visit BP of 164/94 despite being on 5 medications.  He was in the ED in late August for hypertension, and I saw him shortly thereafter.  Most recently he was seen by Laurann Montana, at which time his pressure was much improved to 133/80.  Unfortunately he was having trouble with lower extremity edema, so the amlodipine was decreased to 5 mg daily and he was enrolled in our [place] RPM study.  He was asked to get labs to check catecholamines, metanephrines, cortisol and thyroid function.  Renin/aldosterone was not ordered because he is already on spironolactone, and would prefer not to stop it at this time.  Today he is back for follow up.  Has not been able to check BP at home as the Vivify cuff is too small.  His weight is down just over 50 pounds since the beginning of the year, and was on Wegovy until a few weeks ago.  The 1.7 mg dose was causing some constipation, so he discontinued.  Today he worries that he is gaining some of the weight back.    CLONIDINE PATCH  Blood Pressure Goal:  130/80  Current Medications: hydralazine 100 mg tid, labetalol 300 mg bid, spironolactone 25 mg qd, valsartan 160 mg bid  Family Hx:  father recent diagnosis of hypertension, mother no htn, brothers  fine; 2 young children   Social Hx:  smokes about 3-5 cig/day; rare alcohol, no regular caffeine   Diet:  cut back on portions with Wegovy down 34 pounds since starting    Exercise: 3 times each week, cardio weights, walking; walks regularly at work  Home BP readings: none with him today     Medication Intolerances:  Lisinopril - cough  Chlorthalidone - nausea  Amlodipine 10 mg - edema  Accessory Clinical Findings    Lab Results  Component Value Date   CREATININE 1.92 (H) 06/26/2022   BUN 27 (H) 06/26/2022   NA 137 06/26/2022   K 4.0 06/26/2022   CL 104 06/26/2022   CO2 22 06/26/2022   Lab Results  Component Value Date   ALT 23 06/26/2022   AST 14 (L) 06/26/2022   ALKPHOS 79 06/26/2022   BILITOT 0.6 06/26/2022   Lab Results  Component Value Date   HGBA1C 5.4 02/27/2022    Home Medications    Current Outpatient Medications  Medication Sig Dispense Refill   cyclobenzaprine (FLEXERIL) 10 MG tablet One tab at bedtime for tension neck pain 30 tablet 1   gabapentin (NEURONTIN) 100 MG capsule Take 100 mg by mouth at bedtime.     hydrALAZINE (APRESOLINE) 100 MG tablet Take 1 tablet (100 mg total) by mouth 3 (three) times daily. 180 tablet 3  ibuprofen (ADVIL) 200 MG tablet Take 600 mg by mouth 2 (two) times daily as needed for headache.     labetalol (NORMODYNE) 300 MG tablet Take 1 tablet (300 mg total) by mouth 2 (two) times daily. 180 tablet 3   Meclizine HCl 25 MG CHEW Chew 1 tablet (25 mg total) by mouth 2 (two) times daily as needed (dizziness). 30 tablet 2   naproxen sodium (ALEVE) 220 MG tablet Take 660 mg by mouth 2 (two) times daily as needed (headache).     Semaglutide-Weight Management (WEGOVY) 1 MG/0.5ML SOAJ Inject 1 mg into the skin every 7 (seven) days. 2 mL 1   spironolactone (ALDACTONE) 25 MG tablet Take 1 tablet (25 mg total) by mouth daily. 30 tablet 5   topiramate (TOPAMAX) 50 MG tablet Take 1 tablet (50 mg total) by mouth daily. 60 tablet 2    Ubrogepant (UBRELVY) 100 MG TABS Take 100 mg by mouth as needed (Take 1 tablet at onset of migraine. may repeat an additional dose in 2 hours if needed (Do NOT exceed more than 2 tablets in 24 hours)). 10 mg at headache onset. 8 tablet 5   valsartan (DIOVAN) 160 MG tablet Take 1 tablet (160 mg total) by mouth 2 (two) times daily. 180 tablet 3   No current facility-administered medications for this visit.     Assessment & Plan    No BP recorded.  {Refresh Note OR Click here to enter BP  :1}***   No problem-specific Assessment & Plan notes found for this encounter.    Tommy Medal PharmD CPP [facility]  [address] [place], [place] [address] [phone]

## 2022-10-18 ENCOUNTER — Other Ambulatory Visit (HOSPITAL_BASED_OUTPATIENT_CLINIC_OR_DEPARTMENT_OTHER): Payer: Self-pay

## 2022-10-25 ENCOUNTER — Other Ambulatory Visit (HOSPITAL_BASED_OUTPATIENT_CLINIC_OR_DEPARTMENT_OTHER): Payer: Self-pay

## 2022-10-29 ENCOUNTER — Telehealth: Payer: Self-pay

## 2022-10-29 NOTE — Telephone Encounter (Signed)
Call to pt reference next PREP class.  Offered a daytime class. Pt needs an evening class.  Will call him back as soon as I have the evening class starting.

## 2022-11-05 ENCOUNTER — Other Ambulatory Visit: Payer: Self-pay | Admitting: Urology

## 2022-11-05 ENCOUNTER — Telehealth (HOSPITAL_BASED_OUTPATIENT_CLINIC_OR_DEPARTMENT_OTHER): Payer: Self-pay | Admitting: Family Medicine

## 2022-11-05 DIAGNOSIS — N2889 Other specified disorders of kidney and ureter: Secondary | ICD-10-CM

## 2022-11-05 NOTE — Telephone Encounter (Signed)
Pt advised No to the FLU Vaccine

## 2022-11-07 ENCOUNTER — Other Ambulatory Visit (HOSPITAL_BASED_OUTPATIENT_CLINIC_OR_DEPARTMENT_OTHER): Payer: Self-pay | Admitting: Family

## 2022-11-08 NOTE — Telephone Encounter (Signed)
Patient of Dr. Tobb. Please review for refill. Thank you!  

## 2022-11-09 ENCOUNTER — Ambulatory Visit
Admission: RE | Admit: 2022-11-09 | Discharge: 2022-11-09 | Disposition: A | Discharge status: Home / Self Care | Payer: 59 | Source: Ambulatory Visit | Attending: Urology | Admitting: Urology

## 2022-11-09 ENCOUNTER — Ambulatory Visit: Payer: 59 | Attending: Cardiology | Admitting: Cardiology

## 2022-11-09 ENCOUNTER — Encounter: Payer: Self-pay | Admitting: Cardiology

## 2022-11-09 VITALS — BP 142/86 | HR 70 | Ht 73.0 in | Wt 365.2 lb

## 2022-11-09 DIAGNOSIS — I5032 Chronic diastolic (congestive) heart failure: Secondary | ICD-10-CM | POA: Diagnosis not present

## 2022-11-09 DIAGNOSIS — N2889 Other specified disorders of kidney and ureter: Secondary | ICD-10-CM

## 2022-11-09 DIAGNOSIS — E662 Morbid (severe) obesity with alveolar hypoventilation: Secondary | ICD-10-CM

## 2022-11-09 DIAGNOSIS — I10 Essential (primary) hypertension: Secondary | ICD-10-CM | POA: Diagnosis not present

## 2022-11-09 DIAGNOSIS — G4733 Obstructive sleep apnea (adult) (pediatric): Secondary | ICD-10-CM | POA: Diagnosis not present

## 2022-11-09 MED ORDER — AMLODIPINE BESYLATE 10 MG PO TABS: 10.0000 mg | ORAL_TABLET | Freq: Every day | ORAL | 3 refills | Status: DC

## 2022-11-09 NOTE — Patient Instructions (Signed)
Medication Instructions:  Your physician has recommended you make the following change in your medication:  INCREASE: Amlodipine 10 mg once daily *If you need a refill on your cardiac medications before your next appointment, please call your pharmacy*   Lab Work: None If you have labs (blood work) drawn today and your tests are completely normal, you will receive your results only by: [place] (if you have MyChart) OR A paper copy in the mail If you have any lab test that is abnormal or we need to change your treatment, we will call you to review the results.   Testing/Procedures: None   Follow-Up: At [facility], you and your health needs are our priority.  As part of our continuing mission to provide you with exceptional heart care, we have created designated Provider Care Teams.  These Care Teams include your primary Cardiologist (physician) and Advanced Practice Providers (APPs -  Physician Assistants and Nurse Practitioners) who all work together to provide you with the care you need, when you need it.   Your next appointment:   4 month(s)  Provider:   Berniece Salines, DO

## 2022-11-09 NOTE — Consult Note (Signed)
Chief Complaint: Patient was seen in consultation today for suspicious renal lesions  Referring Physician(s): Winter,Christopher Marjory Lies  History of Present Illness: Calvin Bailey is a 42 y.o. male with hypertension, obesity and renal insufficiency. Patient had a CT of the abdomen on 06/26/2022 that demonstrated suspicious right renal lesions.  Patient subsequently had an abdominal ultrasound and abdominal MRI. Dr. Gilford Rile in Urology referred the patient to Interventional Radiology to discuss percutaneous ablation of the renal lesions.  Patient's main complaint is right-sided lower back pain.  He denies hematuria or dysuria.  He feels like he has incomplete bladder emptying on occasion.  Patient is a smoker but he has been cutting down and trying to quit.  He denies chest pain.  He has some right lower abdominal pain.  Patient recently saw nephrology and according to the patient they are just following his labs and have a follow-up appointment in approximately 6 months. Most recent creatinine was 1.92 on 06/26/2022.  Past Medical History:  Diagnosis Date   Dizziness 09/25/2019   GERD (gastroesophageal reflux disease)    HTN (hypertension)    Obesity hypoventilation syndrome (HCC) 04/08/2014   OSA (obstructive sleep apnea) 04/08/2014   PFO (patent foramen ovale)    Sleep apnea     Past Surgical History:  Procedure Laterality Date   two knee surgeries Left 1995    Allergies: Chlorthalidone and Lisinopril  Medications: Prior to Admission medications   Medication Sig Start Date End Date Taking? Authorizing Provider  amLODipine (NORVASC) 10 MG tablet Take 1 tablet (10 mg total) by mouth daily. 11/09/22   Tobb, Kardie, DO  hydrALAZINE (APRESOLINE) 100 MG tablet Take 1 tablet (100 mg total) by mouth 3 (three) times daily. 08/24/22   Skeet Latch, MD  ibuprofen (ADVIL) 200 MG tablet Take 600 mg by mouth 2 (two) times daily as needed for headache.    [provider]   labetalol (NORMODYNE) 300 MG tablet Take 1 tablet (300 mg total) by mouth 2 (two) times daily. 08/24/22   Skeet Latch, MD  Semaglutide-Weight Management [facility]) 1 MG/0.5ML SOAJ Inject 1 mg into the skin every 7 (seven) days. 08/24/22   Skeet Latch, MD  spironolactone (ALDACTONE) 25 MG tablet TAKE 1 TABLET(25 MG) BY MOUTH DAILY 11/08/22   Loel Dubonnet, NP  valsartan (DIOVAN) 160 MG tablet Take 1 tablet (160 mg total) by mouth 2 (two) times daily. 07/01/22   Loel Dubonnet, NP  LISINOPRIL-HYDROCHLOROTHIAZIDE PO Take by mouth.  07/06/19  [provider]     Family History  Problem Relation Age of Onset   Hypertension Mother    Multiple sclerosis Mother    Hypertension Father    Hypertension Brother    Hyperlipidemia Paternal Grandfather    Heart failure Paternal Grandfather    Diabetes Maternal Grandmother    Heart failure Maternal Grandfather    CVA Maternal Uncle     Social History   Socioeconomic History   Marital status: Single    Spouse name: Not on file   Number of children: 2   Years of education: college   Highest education level: Some college, no degree  Occupational History   Occupation: unemployment    Fish farm manager: UNEMPLOYED    Employer: [employer]  Tobacco Use   Smoking status: Former    Packs/day: 0.10    Years: 16.00    Total pack years: 1.60    Types: Cigarettes    Quit date: 06/08/2022    Years since quitting:  0.4   Smokeless tobacco: Never  Vaping Use   Vaping Use: Never used  Substance and Sexual Activity   Alcohol use: Not Currently    Comment: sparingly    Drug use: No   Sexual activity: Yes    Partners: Female  Other Topics Concern   Not on file  Social History Narrative   Nature conservation officer   Patient is single and lives alone.   Patient is left-handed.   Patient does not drink any caffeine.   Social Determinants of Health   Financial Resource Strain: Low Risk  (07/01/2022)   Overall Financial Resource Strain  (CARDIA)    Difficulty of Paying Living Expenses: Not hard at all  Food Insecurity: No Food Insecurity (07/01/2022)   Hunger Vital Sign    Worried About Running Out of Food in the Last Year: Never true    Ran Out of Food in the Last Year: Never true  Transportation Needs: No Transportation Needs (07/01/2022)   PRAPARE - Hydrologist (Medical): No    Lack of Transportation (Non-Medical): No  Physical Activity: Insufficiently Active (07/01/2022)   Exercise Vital Sign    Days of Exercise per Week: 3 days    Minutes of Exercise per Session: 30 min  Stress: No Stress Concern Present (07/01/2022)   [place]    Feeling of Stress : Only a little  Social Connections: Socially Isolated (07/01/2022)   Social Connection and Isolation Panel [NHANES]    Frequency of Communication with Friends and Family: Once a week    Frequency of Social Gatherings with Friends and Family: Once a week    Attends Religious Services: 1 to 4 times per year    Active Member of [employer] or Organizations: No    Attends Archivist Meetings: Never    Marital Status: Never married    ECOG Status: 1 - Symptomatic but completely ambulatory   Review of Systems  Constitutional: Negative.   Respiratory: Negative.    Cardiovascular:  Negative for chest pain.  Gastrointestinal:  Positive for abdominal pain.  Genitourinary:  Positive for difficulty urinating. Negative for dysuria and hematuria.  Musculoskeletal:  Positive for back pain.    Vital Signs: BP (!) 164/97 (BP Location: Left Arm)   Pulse 79   SpO2 96%     Physical Exam Constitutional:      Appearance: He is obese. He is not ill-appearing.  Cardiovascular:     Rate and Rhythm: Normal rate and regular rhythm.  Pulmonary:     Effort: Pulmonary effort is normal.     Breath sounds: Normal breath sounds.  Abdominal:     Palpations: Abdomen is soft. There is  no mass.     Tenderness: There is no abdominal tenderness.  Neurological:     Mental Status: He is alert.      Imaging: Narrative & Impression  CLINICAL DATA:  Characterize indeterminate renal lesions incidentally identified by CT   EXAM: MRI ABDOMEN WITHOUT AND WITH CONTRAST   TECHNIQUE: Multiplanar multisequence MR imaging of the abdomen was performed both before and after the administration of intravenous contrast.   CONTRAST:  10 mL Vueway gadolinium contrast IV   COMPARISON:  CT abdomen pelvis, 06/26/2022, renal ultrasound, 06/27/2022   FINDINGS: Examination is significantly limited by breath motion artifact body habitus, and field inhomogeneity.   Lower chest: No acute abnormality.   Hepatobiliary: No solid liver abnormality is seen. Mild hepatic  steatosis. No gallstones, gallbladder wall thickening, or biliary dilatation.   Pancreas: Unremarkable. No pancreatic ductal dilatation or surrounding inflammatory changes.   Spleen: Normal in size without significant abnormality.   Adrenals/Urinary Tract: Adrenal glands are unremarkable. Redemonstrated partially exophytic lesion of the superior pole of the right kidney measuring 3.2 x 2.8 cm (series 19, image 60). This appears to demonstrate significant, heterogeneous internal susceptibility T2 signal dropout as well as intrinsic T1 hyperintensity. There is no clear contrast enhancement on multiphasic imaging, although assessment is somewhat difficult due to associated artifact and misregistration on subtraction images. An additional partially exophytic lesion of the lateral midportion of the right kidney is both intrinsically T1 and T2 intermediate in signal, measuring 2.0 x 1.4 cm with significantly more convincing evidence of low-level internal contrast enhancement (series 20, image 77). The left kidney is normal. No hydronephrosis.   Stomach/Bowel: Stomach is within normal limits. No evidence of bowel wall  thickening, distention, or inflammatory changes.   Vascular/Lymphatic: No significant vascular findings are present. No enlarged abdominal lymph nodes.   Other: No abdominal wall hernia or abnormality. No ascites.   Musculoskeletal: No acute or significant osseous findings. Unchanged, nonenhancing subcutaneous inclusion cyst of the right flank (series 15, image 10).   IMPRESSION: 1. Examination is significantly limited by breath motion artifact, body habitus, and field inhomogeneity. 2. Redemonstrated partially exophytic lesion of the superior pole of the right kidney measuring 3.2 x 2.8 cm. 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 3. An additional partially exophytic lesion of the lateral midportion of the right kidney is both intrinsically T1 and T2 intermediate in signal, measuring 2.0 x 1.4 cm with significantly more convincing evidence of low-level internal contrast enhancement, consistent with a small renal cell carcinoma; this lesion also with solid character on prior ultrasound. 4. Mild hepatic steatosis.   These results will be called to the ordering clinician or representative by the Radiologist Assistant, and communication documented in the PACS or [employer].     Electronically Signed   By: Delanna Ahmadi M.D.   On: 08/29/2022 14:05   Labs:  CBC: Recent Labs    02/26/22 1530 02/27/22 0756 05/25/22 2205 06/26/22 1729  WBC 8.6 6.4 8.5 8.7  HGB 14.4 14.4 14.0 14.5  HCT 42.9 41.7 40.6 41.7  PLT 249 224 239 279    COAGS: Recent Labs    02/26/22 1530  INR 1.0  APTT 35    BMP: Recent Labs    02/26/22 1530 02/27/22 0756 05/25/22 2205 06/26/22 1729  NA 137 138 138 137  K 4.1 4.2 3.9 4.0  CL 105 108 107 104  CO2 24 23 22 22  $ GLUCOSE 104* 131* 91 89  BUN 27* 21* 23* 27*  CALCIUM 9.2 8.8* 9.7 9.7   CREATININE 1.79* 1.72* 1.74* 1.92*  GFRNONAA 48* 50* 50* 44*    LIVER FUNCTION TESTS: Recent Labs    02/26/22 1530 02/27/22 0756 05/25/22 2205 06/26/22 1729  BILITOT 0.4 0.8 0.5 0.6  AST 21 21 20 $ 14*  ALT 26 27 32 23  ALKPHOS 59 59 75 79  PROT 7.6 6.6 7.4 8.2*  ALBUMIN 4.5 3.9 4.5 4.8    TUMOR MARKERS: No results for input(s): "AFPTM", "CEA", "CA199", "CHROMGRNA" in the last 8760 hours.  Assessment and Plan:  42 year old with suspicious renal lesions.  I personally reviewed the patient's imaging including noncontrast abdominal CT, an  abdominal ultrasound and abdominal MRI.  The largest lesion in the right kidney upper pole measures approximately 3.3 cm and has significant susceptibility artifact.  Findings are suggestive for blood products within the lesion.  I do not confidently see enhancement within this right kidney upper pole lesion.  There is a second lesion along the lateral right kidney interpolar region that measures approximately 2.4 cm in the craniocaudal dimension on the prior CT.  This lesion probably has a small amount of internal enhancement based on the MRI and is suggestive for a solid neoplasm.  However, I also see at least 2 other small lesions in the left kidney.  There is a T2 dark lesion in the posterior left kidney that measures roughly 1.2 cm and there is another subtle lesion along the inferior medial aspect left kidney.  These lesions are indeterminate based on my interpretation.  I am concerned the patient may have multiple small bilateral renal neoplasms and feel that we need to get a biopsy for more information.The right kidney interpolar lesion is hyperechoic on the previous ultrasound which could represent a renal cell carcinoma but could also be associated with a small angiomyolipoma.   Reviewed the imaging with the patient and explained my rationale for getting a biopsy prior to any treatment.  If the right kidney interpolar lesion is compatible with a  renal cell carcinoma, this lesion should be amendable for percutaneous cryoablation.  Again, I am less concerned about the larger lesion in the right kidney upper pole due to the blood products but it is possible there could be an underlying neoplasm and will require close follow-up.  Percutaneous ablation of this right upper pole lesion would be technically difficult due to the location, proximity to surrounding structures and patient's obesity.  Explained that the risk of tumor seeding with the renal biopsy is very low and the largest risk of the procedure is bleeding due to his hypertension.  Plan for ultrasound-guided biopsy of the right kidney interpolar lesion.  Based on the results of the biopsy, we will discuss the need for renal ablation and/or follow-up.  Thank you for this interesting consult.  I greatly enjoyed meeting Calvin Bailey and look forward to participating in their care.  A copy of this report was sent to the requesting provider on this date.  Electronically Signed: Burman Riis 11/09/2022, 2:30 PM   I spent a total of  30 Minutes   in face to face in clinical consultation, greater than 50% of which was counseling/coordinating care for renal lesion management.

## 2022-11-10 ENCOUNTER — Other Ambulatory Visit: Payer: Self-pay | Admitting: Diagnostic Radiology

## 2022-11-10 DIAGNOSIS — N2889 Other specified disorders of kidney and ureter: Secondary | ICD-10-CM

## 2022-11-12 NOTE — Progress Notes (Signed)
Cardiology Office Note:    Date:  11/12/2022   ID:  Calvin Bailey, DOB [birth year], MRN [id number]  PCP:  de [place], [name], MD  Cardiologist:  Berniece Salines, DO  Electrophysiologist:  None   Referring MD: de [place], [name], MD   " I am doing ok<  History of Present Illness:    Calvin Bailey is a 42 y.o. male with a hx of chronic hypertension has been on multiple antihypertensive, tobacco use, chronic kidney disease, obstructive sleep apnea, dyslipidemia and obesity here today for follow-up visit.  At his last visit I have referred the patient to our hypertensive clinic and has been following up there as well as he also has been following with our Pharm.D. team.  He also has been enrolled in our management of hypertension trial.  Today he offers no complaints.  He still continues to have leg swelling despite amlodipine cut back to 5 mg.   Past Medical History:  Diagnosis Date   Dizziness 09/25/2019   GERD (gastroesophageal reflux disease)    HTN (hypertension)    Obesity hypoventilation syndrome (HCC) 04/08/2014   OSA (obstructive sleep apnea) 04/08/2014   PFO (patent foramen ovale)    Sleep apnea     Past Surgical History:  Procedure Laterality Date   two knee surgeries Left 1995    Current Medications: Current Meds  Medication Sig   amLODipine (NORVASC) 10 MG tablet Take 1 tablet (10 mg total) by mouth daily.   hydrALAZINE (APRESOLINE) 100 MG tablet Take 1 tablet (100 mg total) by mouth 3 (three) times daily.   ibuprofen (ADVIL) 200 MG tablet Take 600 mg by mouth 2 (two) times daily as needed for headache.   labetalol (NORMODYNE) 300 MG tablet Take 1 tablet (300 mg total) by mouth 2 (two) times daily.   Semaglutide-Weight Management (WEGOVY) 1 MG/0.5ML SOAJ Inject 1 mg into the skin every 7 (seven) days.   spironolactone (ALDACTONE) 25 MG tablet TAKE 1 TABLET(25 MG) BY MOUTH DAILY   valsartan (DIOVAN) 160 MG tablet Take 1 tablet (160 mg total) by mouth 2 (two)  times daily.   [DISCONTINUED] amLODipine (NORVASC) 10 MG tablet Take 5 mg by mouth daily.     Allergies:   Chlorthalidone and Lisinopril   Social History   Socioeconomic History   Marital status: Single    Spouse name: Not on file   Number of children: 2   Years of education: college   Highest education level: Some college, no degree  Occupational History   Occupation: unemployment    Fish farm manager: UNEMPLOYED    Employer: [employer]  Tobacco Use   Smoking status: Former    Packs/day: 0.10    Years: 16.00    Total pack years: 1.60    Types: Cigarettes    Quit date: 06/08/2022    Years since quitting: 0.4   Smokeless tobacco: Never  Vaping Use   Vaping Use: Never used  Substance and Sexual Activity   Alcohol use: Not Currently    Comment: sparingly    Drug use: No   Sexual activity: Yes    Partners: Female  Other Topics Concern   Not on file  Social History Narrative   Nature conservation officer   Patient is single and lives alone.   Patient is left-handed.   Patient does not drink any caffeine.   Social Determinants of Health   Financial Resource Strain: Low Risk  (07/01/2022)   Overall Financial Resource Strain (CARDIA)  Difficulty of Paying Living Expenses: Not hard at all  Food Insecurity: No Food Insecurity (07/01/2022)   Hunger Vital Sign    Worried About Running Out of Food in the Last Year: Never true    Ran Out of Food in the Last Year: Never true  Transportation Needs: No Transportation Needs (07/01/2022)   PRAPARE - Hydrologist (Medical): No    Lack of Transportation (Non-Medical): No  Physical Activity: Insufficiently Active (07/01/2022)   Exercise Vital Sign    Days of Exercise per Week: 3 days    Minutes of Exercise per Session: 30 min  Stress: No Stress Concern Present (07/01/2022)   [place]    Feeling of Stress : Only a little  Social Connections:  Socially Isolated (07/01/2022)   Social Connection and Isolation Panel [NHANES]    Frequency of Communication with Friends and Family: Once a week    Frequency of Social Gatherings with Friends and Family: Once a week    Attends Religious Services: 1 to 4 times per year    Active Member of [employer] or Organizations: No    Attends Music therapist: Never    Marital Status: Never married     Family History: The patient's family history includes CVA in his maternal uncle; Diabetes in his maternal grandmother; Heart failure in his maternal grandfather and paternal grandfather; Hyperlipidemia in his paternal grandfather; Hypertension in his brother, father, and mother; Multiple sclerosis in his mother.  ROS:   Review of Systems  Constitution: Negative for decreased appetite, fever and weight gain.  HENT: Negative for congestion, ear discharge, hoarse voice and sore throat.   Eyes: Negative for discharge, redness, vision loss in right eye and visual halos.  Cardiovascular: Negative for chest pain, dyspnea on exertion, leg swelling, orthopnea and palpitations.  Respiratory: Negative for cough, hemoptysis, shortness of breath and snoring.   Endocrine: Negative for heat intolerance and polyphagia.  Hematologic/Lymphatic: Negative for bleeding problem. Does not bruise/bleed easily.  Skin: Negative for flushing, nail changes, rash and suspicious lesions.  Musculoskeletal: Negative for arthritis, joint pain, muscle cramps, myalgias, neck pain and stiffness.  Gastrointestinal: Negative for abdominal pain, bowel incontinence, diarrhea and excessive appetite.  Genitourinary: Negative for decreased libido, genital sores and incomplete emptying.  Neurological: Negative for brief paralysis, focal weakness, headaches and loss of balance.  Psychiatric/Behavioral: Negative for altered mental status, depression and suicidal ideas.  Allergic/Immunologic: Negative for HIV exposure and persistent  infections.    EKGs/Labs/Other Studies Reviewed:    The following studies were reviewed today:   EKG:  The ekg ordered today demonstrates   Recent Labs: 02/27/2022: Magnesium 2.1 06/26/2022: ALT 23; BUN 27; Creatinine, Ser 1.92; Hemoglobin 14.5; Platelets 279; Potassium 4.0; Sodium 137  Recent Lipid Panel    Component Value Date/Time   CHOL 136 02/27/2022 0756   CHOL 149 05/30/2019 1500   TRIG 74 02/27/2022 0756   HDL 28 (L) 02/27/2022 0756   HDL 34 (L) 05/30/2019 1500   CHOLHDL 4.9 02/27/2022 0756   VLDL 15 02/27/2022 0756   LDLCALC 93 02/27/2022 0756   LDLCALC 93 05/30/2019 1500    Physical Exam:    VS:  BP (!) 142/86   Pulse 70   Ht 6' 1"$  (1.854 m)   Wt (!) 165.7 kg   SpO2 98%   BMI 48.18 kg/m     Wt Readings from Last 3 Encounters:  11/09/22 (!) 165.7 kg  07/01/22 (!) 156.5 kg  07/01/22 (!) 156.4 kg     GEN: Well nourished, well developed in no acute distress HEENT: Normal NECK: No JVD; No carotid bruits LYMPHATICS: No lymphadenopathy CARDIAC: S1S2 noted,RRR, no murmurs, rubs, gallops RESPIRATORY:  Clear to auscultation without rales, wheezing or rhonchi  ABDOMEN: Soft, non-tender, non-distended, +bowel sounds, no guarding. EXTREMITIES: No edema, No cyanosis, no clubbing MUSCULOSKELETAL:  No deformity  SKIN: Warm and dry NEUROLOGIC:  Alert and oriented x 3, non-focal PSYCHIATRIC:  Normal affect, good insight  ASSESSMENT:    1. Essential hypertension   2. Chronic diastolic CHF (congestive heart failure) ([place])   3. OSA (obstructive sleep apnea)   4. Obesity hypoventilation syndrome ([place])   5. Morbid obesity due to excess calories (HCC)    PLAN:     His blood pressure has improved significantly but not yet to goal.  Despite the decrease of amlodipine he still appears to have significant leg swelling which leads me to suspect that his kidney disease is also contributing.  Due to the fact that his blood pressure is not quite controlled like to go back on  his Norvasc to 10 mg daily continue his hydralazine 100 mg twice daily, continue his labetalol, spironolactone and valsartan.  He still will need to follow-up with the hypertension clinic given the fact that he is also enrolled in the clinical trial.  He also should follow-up with our pharmacist colleagues as well.  The patient is in agreement with the above plan. The patient left the office in stable condition.  The patient will follow up in   Medication Adjustments/Labs and Tests Ordered: Current medicines are reviewed at length with the patient today.  Concerns regarding medicines are outlined above.  No orders of the defined types were placed in this encounter.  Meds ordered this encounter  Medications   amLODipine (NORVASC) 10 MG tablet    Sig: Take 1 tablet (10 mg total) by mouth daily.    Dispense:  90 tablet    Refill:  3    Patient Instructions  Medication Instructions:  Your physician has recommended you make the following change in your medication:  INCREASE: Amlodipine 10 mg once daily *If you need a refill on your cardiac medications before your next appointment, please call your pharmacy*   Lab Work: None If you have labs (blood work) drawn today and your tests are completely normal, you will receive your results only by: [place] (if you have MyChart) OR A paper copy in the mail If you have any lab test that is abnormal or we need to change your treatment, we will call you to review the results.   Testing/Procedures: None   Follow-Up: At [facility], you and your health needs are our priority.  As part of our continuing mission to provide you with exceptional heart care, we have created designated Provider Care Teams.  These Care Teams include your primary Cardiologist (physician) and Advanced Practice Providers (APPs -  Physician Assistants and Nurse Practitioners) who all work together to provide you with the care you need, when you need  it.   Your next appointment:   4 month(s)  Provider:   Berniece Salines, DO    Adopting a Healthy Lifestyle.  Know what a healthy weight is for you (roughly BMI <25) and aim to maintain this   Aim for 7+ servings of fruits and vegetables daily   65-80+ fluid ounces of water or unsweet tea for healthy kidneys  Limit to max 1 drink of alcohol per day; avoid smoking/tobacco   Limit animal fats in diet for cholesterol and heart health - choose grass fed whenever available   Avoid highly processed foods, and foods high in saturated/trans fats   Aim for low stress - take time to unwind and care for your mental health   Aim for 150 min of moderate intensity exercise weekly for heart health, and weights twice weekly for bone health   Aim for 7-9 hours of sleep daily   When it comes to diets, agreement about the perfect plan isnt easy to find, even among the experts. Experts at the [place] developed an idea known as the Healthy Eating Plate. Just imagine a plate divided into logical, healthy portions.   The emphasis is on diet quality:   Load up on vegetables and fruits - one-half of your plate: Aim for color and variety, and remember that potatoes dont count.   Go for whole grains - one-quarter of your plate: Whole wheat, barley, wheat berries, quinoa, oats, brown rice, and foods made with them. If you want pasta, go with whole wheat pasta.   Protein power - one-quarter of your plate: Fish, chicken, beans, and nuts are all healthy, versatile protein sources. Limit red meat.   The diet, however, does go beyond the plate, offering a few other suggestions.   Use healthy plant oils, such as olive, canola, soy, corn, sunflower and peanut. Check the labels, and avoid partially hydrogenated oil, which have unhealthy trans fats.   If youre thirsty, drink water. Coffee and tea are good in moderation, but skip sugary drinks and limit milk and dairy products to one or  two daily servings.   The type of carbohydrate in the diet is more important than the amount. Some sources of carbohydrates, such as vegetables, fruits, whole grains, and beans-are healthier than others.   Finally, stay active  Signed, Berniece Salines, DO  11/12/2022 12:55 PM    [facility]

## 2022-11-13 ENCOUNTER — Encounter (HOSPITAL_COMMUNITY): Payer: Self-pay | Admitting: Emergency Medicine

## 2022-11-13 ENCOUNTER — Emergency Department (HOSPITAL_COMMUNITY): Payer: 59

## 2022-11-13 ENCOUNTER — Emergency Department (HOSPITAL_COMMUNITY)
Admission: EM | Admit: 2022-11-13 | Discharge: 2022-11-13 | Disposition: A | Discharge status: Home / Self Care | Payer: 59 | Attending: Emergency Medicine | Admitting: Emergency Medicine

## 2022-11-13 ENCOUNTER — Other Ambulatory Visit: Payer: Self-pay

## 2022-11-13 DIAGNOSIS — N2889 Other specified disorders of kidney and ureter: Secondary | ICD-10-CM | POA: Diagnosis not present

## 2022-11-13 DIAGNOSIS — I10 Essential (primary) hypertension: Secondary | ICD-10-CM | POA: Insufficient documentation

## 2022-11-13 DIAGNOSIS — R071 Chest pain on breathing: Secondary | ICD-10-CM | POA: Diagnosis present

## 2022-11-13 DIAGNOSIS — Z79899 Other long term (current) drug therapy: Secondary | ICD-10-CM | POA: Insufficient documentation

## 2022-11-13 LAB — HEPATIC FUNCTION PANEL
ALT: 16 U/L (ref 0–44)
AST: 14 U/L — ABNORMAL LOW (ref 15–41)
Albumin: 3.7 g/dL (ref 3.5–5.0)
Alkaline Phosphatase: 70 U/L (ref 38–126)
Bilirubin, Direct: <0.1 mg/dL (ref 0.0–0.2)
Total Bilirubin: 0.6 mg/dL (ref 0.3–1.2)
Total Protein: 7 g/dL (ref 6.5–8.1)

## 2022-11-13 LAB — CBC
HCT: 40.6 % (ref 39.0–52.0)
Hemoglobin: 13.5 g/dL (ref 13.0–17.0)
MCH: 29.4 pg (ref 26.0–34.0)
MCHC: 33.3 g/dL (ref 30.0–36.0)
MCV: 88.5 fL (ref 80.0–100.0)
Platelets: 231 10*3/uL (ref 150–400)
RBC: 4.59 MIL/uL (ref 4.22–5.81)
RDW: 12.2 % (ref 11.5–15.5)
WBC: 10.8 10*3/uL — ABNORMAL HIGH (ref 4.0–10.5)
nRBC: 0 % (ref 0.0–0.2)

## 2022-11-13 LAB — BASIC METABOLIC PANEL
Anion gap: 10 (ref 5–15)
BUN: 15 mg/dL (ref 6–20)
CO2: 23 mmol/L (ref 22–32)
Calcium: 8.7 mg/dL — ABNORMAL LOW (ref 8.9–10.3)
Chloride: 104 mmol/L (ref 98–111)
Creatinine, Ser: 1.46 mg/dL — ABNORMAL HIGH (ref 0.61–1.24)
GFR, Estimated: >60 mL/min (ref >60)
Glucose, Bld: 142 mg/dL — ABNORMAL HIGH (ref 70–99)
Potassium: 3.6 mmol/L (ref 3.5–5.1)
Sodium: 137 mmol/L (ref 135–145)

## 2022-11-13 LAB — LACTIC ACID, PLASMA: Lactic Acid, Venous: 1 mmol/L (ref 0.5–1.9)

## 2022-11-13 LAB — TROPONIN I (HIGH SENSITIVITY)
Troponin I (High Sensitivity): 7 ng/L (ref <18)
Troponin I (High Sensitivity): 7 ng/L (ref <18)

## 2022-11-13 LAB — LIPASE, BLOOD: Lipase: 114 U/L — ABNORMAL HIGH (ref 11–51)

## 2022-11-13 MED ORDER — TRAMADOL HCL 50 MG PO TABS: ORAL_TABLET | ORAL | 0 refills | Status: DC

## 2022-11-13 MED ORDER — IOHEXOL 350 MG/ML SOLN
100.0000 mL | Freq: Once | INTRAVENOUS | Status: AC | PRN
  Administered 2022-11-13: 100 mL via INTRAVENOUS

## 2022-11-13 MED ORDER — ACETAMINOPHEN 500 MG PO TABS: 500.0000 mg | ORAL_TABLET | Freq: Four times a day (QID) | ORAL | 0 refills | Status: DC | PRN

## 2022-11-13 NOTE — ED Triage Notes (Signed)
Pt via POV c/o right sided chest pain described as aching that has been intermittent since Thursday. Pain worsens with deep breaths and movements. Denies any associated s/s. No SOB. No cough/fever/chills. BP 212/103 in triage. States missing morning dose of BP medications.

## 2022-11-13 NOTE — Discharge Instructions (Signed)
1.  The CT scan of your chest did not show evidence of a blood clot in the lungs or other tumor or infection in the lungs at this time.  Your pain may be due to pleurisy or muscular pain. 2.  The masses on your kidney appear stable.  Continue to work with your doctor as planned for biopsies and further evaluation.  CT scan does not suggest that they are malignant or worsening at this time. 3.  You may take extra strength Tylenol every 6 hours with 1-2 tramadol tablets as prescribed for pain as needed. 4.  Return to the emergency department if you have new concerning or worsening symptoms.

## 2022-11-13 NOTE — ED Provider Notes (Signed)
[place] EMERGENCY DEPARTMENT AT [facility] Provider Note   CSN: [id number] Arrival date & time: 11/13/22  [id number]     History  Chief Complaint  Patient presents with   Chest Pain    Calvin Bailey is a 42 y.o. male.  HPI Patient has had lower right chest pain for about 3 days.  He reports it has been constant.  It is worse with certain position changes and sometimes a deep breath.  He reports it seems it is gone nowhere.  He slept sitting up and his head was hanging several days ago, so he wondered if he had strained his chest from the position he had slept in but no other possible injury.  He denies he had fever or productive cough.  No pain burning urgency with urination and no blood in the urine.  No atypical lower extremity swelling or pain.  Patient has a renal mass that is being worked up.  He reports he was told once that it was suspected to be malignant but he got seen by interventional radiology and they had low suspicion for malignancy.  He is scheduled at the beginning of March for a biopsy.    Home Medications Prior to Admission medications   Medication Sig Start Date End Date Taking? Authorizing Provider  acetaminophen (TYLENOL) 500 MG tablet Take 1 tablet (500 mg total) by mouth every 6 (six) hours as needed. 11/13/22  Yes Charlesetta Shanks, MD  traMADol (ULTRAM) 50 MG tablet 1 to 2 tablets every 6 hours with extra strength Tylenol for pain as needed 11/13/22  Yes Geneieve Duell, Jeannie Done, MD  amLODipine (NORVASC) 10 MG tablet Take 1 tablet (10 mg total) by mouth daily. 11/09/22   Tobb, Kardie, DO  hydrALAZINE (APRESOLINE) 100 MG tablet Take 1 tablet (100 mg total) by mouth 3 (three) times daily. 08/24/22   Skeet Latch, MD  ibuprofen (ADVIL) 200 MG tablet Take 600 mg by mouth 2 (two) times daily as needed for headache.    [provider]  labetalol (NORMODYNE) 300 MG tablet Take 1 tablet (300 mg total) by mouth 2 (two) times daily. 08/24/22   Skeet Latch, MD  Semaglutide-Weight Management [facility]) 1 MG/0.5ML SOAJ Inject 1 mg into the skin every 7 (seven) days. 08/24/22   Skeet Latch, MD  spironolactone (ALDACTONE) 25 MG tablet TAKE 1 TABLET(25 MG) BY MOUTH DAILY 11/08/22   Loel Dubonnet, NP  valsartan (DIOVAN) 160 MG tablet Take 1 tablet (160 mg total) by mouth 2 (two) times daily. 07/01/22   Loel Dubonnet, NP  LISINOPRIL-HYDROCHLOROTHIAZIDE PO Take by mouth.  07/06/19  [provider]      Allergies    Chlorthalidone and Lisinopril    Review of Systems   Review of Systems  Physical Exam Updated Vital Signs BP (!) 170/96   Pulse 69   Temp 98.2 F (36.8 C) (Oral)   Resp 19   SpO2 95%  Physical Exam Constitutional:      Appearance: Normal appearance.  HENT:     Head: Normocephalic and atraumatic.     Mouth/Throat:     Pharynx: Oropharynx is clear.  Eyes:     Extraocular Movements: Extraocular movements intact.  Cardiovascular:     Rate and Rhythm: Normal rate and regular rhythm.  Pulmonary:     Effort: Pulmonary effort is normal.     Breath sounds: Normal breath sounds.  Chest:     Chest wall: No tenderness.  Abdominal:  General: There is no distension.     Palpations: Abdomen is soft.     Tenderness: There is no abdominal tenderness. There is no guarding.  Musculoskeletal:     Comments: Patient has some chronic peripheral edema about 1-2+ both lower legs.  Appearance particularly on the left is looking like early lymphedema.  Patient identifies this amount of swelling is chronic for him.  Skin:    General: Skin is warm and dry.  Neurological:     General: No focal deficit present.     Mental Status: He is alert and oriented to person, place, and time.     Motor: No weakness.     Coordination: Coordination normal.  Psychiatric:        Mood and Affect: Mood normal.     ED Results / Procedures / Treatments   Labs (all labs ordered are listed, but only abnormal results are  displayed) Labs Reviewed  BASIC METABOLIC PANEL - Abnormal; Notable for the following components:      Result Value   Glucose, Bld 142 (*)    Creatinine, Ser 1.46 (*)    Calcium 8.7 (*)    All other components within normal limits  CBC - Abnormal; Notable for the following components:   WBC 10.8 (*)    All other components within normal limits  HEPATIC FUNCTION PANEL - Abnormal; Notable for the following components:   AST 14 (*)    All other components within normal limits  LIPASE, BLOOD - Abnormal; Notable for the following components:   Lipase 114 (*)    All other components within normal limits  LACTIC ACID, PLASMA  LACTIC ACID, PLASMA  TROPONIN I (HIGH SENSITIVITY)  TROPONIN I (HIGH SENSITIVITY)    EKG EKG Interpretation  Date/Time:  Saturday November 13 2022 06:40:10 EST Ventricular Rate:  81 PR Interval:  185 QRS Duration: 114 QT Interval:  369 QTC Calculation: 429 R Axis:   34 Text Interpretation: Sinus rhythm Borderline intraventricular conduction delay Low voltage, precordial leads RSR' in V1 or V2, right VCD or RVH Confirmed by Molpus, John [phone]) on 11/13/2022 6:46:34 AM  Radiology CT Angio Chest PE W/Cm &/Or Wo Cm  Result Date: 11/13/2022 CLINICAL DATA:  Chest pain. Reported mass of unclear etiology. Also with abdominal pain. EXAM: CT ANGIOGRAPHY CHEST CT ABDOMEN AND PELVIS WITH CONTRAST TECHNIQUE: Multidetector CT imaging of the chest was performed using the standard protocol during bolus administration of intravenous contrast. Multiplanar CT image reconstructions and MIPs were obtained to evaluate the vascular anatomy. Multidetector CT imaging of the abdomen and pelvis was performed using the standard protocol during bolus administration of intravenous contrast. RADIATION DOSE REDUCTION: This exam was performed according to the departmental dose-optimization program which includes automated exposure control, adjustment of the mA and/or kV according to patient size  and/or use of iterative reconstruction technique. CONTRAST:  151m OMNIPAQUE IOHEXOL 350 MG/ML SOLN COMPARISON:  CT abdomen and pelvis, 06/26/2022. Current chest radiograph. CT chest, 07/08/2017. FINDINGS: CTA CHEST FINDINGS Cardiovascular: Exam somewhat limited by suboptimal opacification of the pulmonary arteries and respiratory motion. Allowing for this, there is no evidence of a pulmonary embolism. Heart normal in size and configuration. No pericardial effusion. Normal great vessels. Mediastinum/Nodes: No neck base, mediastinal or hilar masses. No enlarged lymph nodes. Trachea and esophagus are unremarkable. Lungs/Pleura: Lungs are clear. No pleural effusion or pneumothorax. Musculoskeletal: No fracture or acute finding.  No bone lesion. Oval, low-attenuation, circumscribed subcutaneous mass, right back, 4 x 3.2 x 4.3 cm.  Although nonspecific, this may reflect a sebaceous/inclusion cyst. It is new compared to the prior chest CT from 2018. Review of the MIP images confirms the above findings. CT ABDOMEN and PELVIS FINDINGS Hepatobiliary: No focal liver abnormality is seen. No gallstones, gallbladder wall thickening, or biliary dilatation. Pancreas: Unremarkable. No pancreatic ductal dilatation or surrounding inflammatory changes. Spleen: Normal in size without focal abnormality. Adrenals/Urinary Tract: Normal adrenal glands. Kidneys normal in size, orientation and position. 3.1 cm mass, upper pole the right kidney, stable from the prior CT, consistent with a hemorrhagic cyst or cyst with high protein content. Two adjacent small cysts in the midpole the right kidney, similar size, 1 cm, also unchanged. No follow-up these masses is recommended. Small hypoattenuating area along the posterior aspect of the left kidney, not fully defined, likely a cyst. No other renal masses, no stones and no hydronephrosis. Normal ureters. Bladder is unremarkable. Stomach/Bowel: Normal stomach. Small bowel and colon are normal in  caliber. No wall thickening. No inflammation. Scattered left colon diverticula. Normal appendix visualized. Vascular/Lymphatic: No vascular abnormality. Mildly enlarged pelvic lymph nodes, largest along the external iliac chains, 1 on each side measuring 1.3 cm in short axis. These are larger than on the prior CT. No abdominal lymphadenopathy. Reproductive: Unremarkable. Other: Small fat containing umbilical hernia.  No ascites. Musculoskeletal: No fracture or acute finding.  No bone lesion. Review of the MIP images confirms the above findings. IMPRESSION: CTA CHEST 1. Somewhat limited study as detailed above. No evidence of a pulmonary embolism. 2. No acute findings.  Clear lungs. 3. 4.3 cm right posterior chest wall subcutaneous mass, benign-appearing with well-defined margins and low-attenuation, likely a sebaceous cyst or epidermal inclusion cyst. CT ABDOMEN AND PELVIS 1. No acute findings within the abdomen or pelvis. 2. Normal appendix. 3. Nonspecific mild pelvic lymphadenopathy, nodes up to 1.3 cm in short axis, increased in size compared to the prior abdomen and pelvis CT from 06/26/2022. Electronically Signed   By: Lajean Manes M.D.   On: 11/13/2022 10:29   CT ABDOMEN PELVIS W CONTRAST  Result Date: 11/13/2022 CLINICAL DATA:  Chest pain. Reported mass of unclear etiology. Also with abdominal pain. EXAM: CT ANGIOGRAPHY CHEST CT ABDOMEN AND PELVIS WITH CONTRAST TECHNIQUE: Multidetector CT imaging of the chest was performed using the standard protocol during bolus administration of intravenous contrast. Multiplanar CT image reconstructions and MIPs were obtained to evaluate the vascular anatomy. Multidetector CT imaging of the abdomen and pelvis was performed using the standard protocol during bolus administration of intravenous contrast. RADIATION DOSE REDUCTION: This exam was performed according to the departmental dose-optimization program which includes automated exposure control, adjustment of the mA  and/or kV according to patient size and/or use of iterative reconstruction technique. CONTRAST:  134m OMNIPAQUE IOHEXOL 350 MG/ML SOLN COMPARISON:  CT abdomen and pelvis, 06/26/2022. Current chest radiograph. CT chest, 07/08/2017. FINDINGS: CTA CHEST FINDINGS Cardiovascular: Exam somewhat limited by suboptimal opacification of the pulmonary arteries and respiratory motion. Allowing for this, there is no evidence of a pulmonary embolism. Heart normal in size and configuration. No pericardial effusion. Normal great vessels. Mediastinum/Nodes: No neck base, mediastinal or hilar masses. No enlarged lymph nodes. Trachea and esophagus are unremarkable. Lungs/Pleura: Lungs are clear. No pleural effusion or pneumothorax. Musculoskeletal: No fracture or acute finding.  No bone lesion. Oval, low-attenuation, circumscribed subcutaneous mass, right back, 4 x 3.2 x 4.3 cm. Although nonspecific, this may reflect a sebaceous/inclusion cyst. It is new compared to the prior chest CT from 2018. Review of  the MIP images confirms the above findings. CT ABDOMEN and PELVIS FINDINGS Hepatobiliary: No focal liver abnormality is seen. No gallstones, gallbladder wall thickening, or biliary dilatation. Pancreas: Unremarkable. No pancreatic ductal dilatation or surrounding inflammatory changes. Spleen: Normal in size without focal abnormality. Adrenals/Urinary Tract: Normal adrenal glands. Kidneys normal in size, orientation and position. 3.1 cm mass, upper pole the right kidney, stable from the prior CT, consistent with a hemorrhagic cyst or cyst with high protein content. Two adjacent small cysts in the midpole the right kidney, similar size, 1 cm, also unchanged. No follow-up these masses is recommended. Small hypoattenuating area along the posterior aspect of the left kidney, not fully defined, likely a cyst. No other renal masses, no stones and no hydronephrosis. Normal ureters. Bladder is unremarkable. Stomach/Bowel: Normal stomach.  Small bowel and colon are normal in caliber. No wall thickening. No inflammation. Scattered left colon diverticula. Normal appendix visualized. Vascular/Lymphatic: No vascular abnormality. Mildly enlarged pelvic lymph nodes, largest along the external iliac chains, 1 on each side measuring 1.3 cm in short axis. These are larger than on the prior CT. No abdominal lymphadenopathy. Reproductive: Unremarkable. Other: Small fat containing umbilical hernia.  No ascites. Musculoskeletal: No fracture or acute finding.  No bone lesion. Review of the MIP images confirms the above findings. IMPRESSION: CTA CHEST 1. Somewhat limited study as detailed above. No evidence of a pulmonary embolism. 2. No acute findings.  Clear lungs. 3. 4.3 cm right posterior chest wall subcutaneous mass, benign-appearing with well-defined margins and low-attenuation, likely a sebaceous cyst or epidermal inclusion cyst. CT ABDOMEN AND PELVIS 1. No acute findings within the abdomen or pelvis. 2. Normal appendix. 3. Nonspecific mild pelvic lymphadenopathy, nodes up to 1.3 cm in short axis, increased in size compared to the prior abdomen and pelvis CT from 06/26/2022. Electronically Signed   By: Lajean Manes M.D.   On: 11/13/2022 10:29   DG Chest 2 View  Result Date: 11/13/2022 CLINICAL DATA:  Right-sided chest pain EXAM: CHEST - 2 VIEW COMPARISON:  04/10/2020 FINDINGS: Normal heart size and mediastinal contours. No acute infiltrate or edema. No effusion or pneumothorax. No acute osseous findings. Artifact from EKG leads. IMPRESSION: No evidence of active disease. Electronically Signed   By: Jorje Guild M.D.   On: 11/13/2022 07:24    Procedures Procedures    Medications Ordered in ED Medications  iohexol (OMNIPAQUE) 350 MG/ML injection 100 mL (100 mLs Intravenous Contrast Given 11/13/22 0954)    ED Course/ Medical Decision Making/ A&P                             Medical Decision Making Amount and/or Complexity of Data  Reviewed Labs: ordered. Radiology: ordered.  Risk OTC drugs. Prescription drug management.   Patient presents with 4 days of right sided lower chest pain.  Precipitated by deep breaths and movements.  Patient's history includes hypertension and more recently diagnosed renal mass that is undergoing diagnostic evaluation.  Patient does not have hypoxia, fever or hypotension.  At this time differential diagnosis includes PE\ligament C\pneumonia\musculoskeletal pain\pleurisy\biliary colic.  Will proceed with diagnostic evaluation including CT PE study including abdomen to further evaluate for any extension or metastases of mass previously identified but not completely evaluated to this point.  CT PE study reviewed by radiology no central or large PE.  Pre-existing mass in kidney appears to be of benign hemorrhagic cyst but recommendations for further evaluation.  No concerning interval changes.  troponin 7, LFts normal.  Lipase 114.  Pain atypical for ACS.  Troponin normal and EKG without acute ischemia.  Low suspicion for ACS as etiology of chest pain.  Patient is nontoxic and clinically well in appearance.  This does not appear to be of infectious etiology.  No consolidations or suspicious pneumonia identified on CT scan.  History is not suggestive of infectious etiology no fevers or productive cough.  At this time with PE ruled out and ACS low probability and no concerning complications of renal cyst\neoplasm that is under evaluation, I do feel patient stable for discharge.  We have reviewed a plan of combination of acetaminophen and tramadol for pain as needed.  Patient is following up with his PCP and continuing to have scheduled appointments for further evaluation of the renal cyst\neoplasm.  Return precautions reviewed.          Final Clinical Impression(s) / ED Diagnoses Final diagnoses:  Chest pain on breathing  Renal mass    Rx / DC Orders ED Discharge Orders           Ordered    acetaminophen (TYLENOL) 500 MG tablet  Every 6 hours PRN        11/13/22 1146    traMADol (ULTRAM) 50 MG tablet        11/13/22 1146              Charlesetta Shanks, MD 11/13/22 1205

## 2022-11-19 ENCOUNTER — Telehealth: Payer: Self-pay | Admitting: Cardiology

## 2022-11-19 NOTE — Telephone Encounter (Signed)
Spoke with this patient. He states that the ED provider said he may need a scan to look at his arteries to see if they are clogged. He said he had a full work up and they did not find anything wrong with his Heart to explain the intermittent chest pain and neck pain that he has been having.   Appt made 11/26/22 at [facility] with Jory Sims, NP

## 2022-11-19 NOTE — Telephone Encounter (Signed)
Patient states when he went to the hospital on 2/17, they mentioned him having an [place], for clogged arteries.

## 2022-11-20 ENCOUNTER — Observation Stay (HOSPITAL_BASED_OUTPATIENT_CLINIC_OR_DEPARTMENT_OTHER): Payer: 59

## 2022-11-20 ENCOUNTER — Emergency Department (HOSPITAL_COMMUNITY): Payer: 59

## 2022-11-20 ENCOUNTER — Other Ambulatory Visit: Payer: Self-pay

## 2022-11-20 ENCOUNTER — Observation Stay (HOSPITAL_COMMUNITY)
Admission: EM | Admit: 2022-11-20 | Discharge: 2022-11-21 | Disposition: A | Discharge status: Home / Self Care | Payer: 59 | Attending: Internal Medicine | Admitting: Internal Medicine

## 2022-11-20 DIAGNOSIS — Z79899 Other long term (current) drug therapy: Secondary | ICD-10-CM | POA: Diagnosis not present

## 2022-11-20 DIAGNOSIS — I130 Hypertensive heart and chronic kidney disease with heart failure and stage 1 through stage 4 chronic kidney disease, or unspecified chronic kidney disease: Secondary | ICD-10-CM | POA: Insufficient documentation

## 2022-11-20 DIAGNOSIS — R0609 Other forms of dyspnea: Secondary | ICD-10-CM

## 2022-11-20 DIAGNOSIS — I5032 Chronic diastolic (congestive) heart failure: Secondary | ICD-10-CM | POA: Diagnosis present

## 2022-11-20 DIAGNOSIS — F17210 Nicotine dependence, cigarettes, uncomplicated: Secondary | ICD-10-CM | POA: Diagnosis present

## 2022-11-20 DIAGNOSIS — Z87891 Personal history of nicotine dependence: Secondary | ICD-10-CM | POA: Diagnosis not present

## 2022-11-20 DIAGNOSIS — N1831 Chronic kidney disease, stage 3a: Secondary | ICD-10-CM | POA: Diagnosis not present

## 2022-11-20 DIAGNOSIS — R079 Chest pain, unspecified: Secondary | ICD-10-CM | POA: Diagnosis present

## 2022-11-20 DIAGNOSIS — I160 Hypertensive urgency: Secondary | ICD-10-CM | POA: Diagnosis not present

## 2022-11-20 DIAGNOSIS — E662 Morbid (severe) obesity with alveolar hypoventilation: Secondary | ICD-10-CM | POA: Diagnosis present

## 2022-11-20 DIAGNOSIS — K219 Gastro-esophageal reflux disease without esophagitis: Secondary | ICD-10-CM | POA: Diagnosis present

## 2022-11-20 DIAGNOSIS — R0789 Other chest pain: Secondary | ICD-10-CM | POA: Diagnosis present

## 2022-11-20 DIAGNOSIS — G4733 Obstructive sleep apnea (adult) (pediatric): Secondary | ICD-10-CM | POA: Diagnosis present

## 2022-11-20 LAB — CBC
HCT: 44.7 % (ref 39.0–52.0)
Hemoglobin: 14.7 g/dL (ref 13.0–17.0)
MCH: 28.7 pg (ref 26.0–34.0)
MCHC: 32.9 g/dL (ref 30.0–36.0)
MCV: 87.3 fL (ref 80.0–100.0)
Platelets: 313 10*3/uL (ref 150–400)
RBC: 5.12 MIL/uL (ref 4.22–5.81)
RDW: 12.2 % (ref 11.5–15.5)
WBC: 11.3 10*3/uL — ABNORMAL HIGH (ref 4.0–10.5)
nRBC: 0 % (ref 0.0–0.2)

## 2022-11-20 LAB — C-REACTIVE PROTEIN: CRP: 1.5 mg/dL — ABNORMAL HIGH (ref <1.0)

## 2022-11-20 LAB — BASIC METABOLIC PANEL
Anion gap: 9 (ref 5–15)
BUN: 29 mg/dL — ABNORMAL HIGH (ref 6–20)
CO2: 23 mmol/L (ref 22–32)
Calcium: 8.9 mg/dL (ref 8.9–10.3)
Chloride: 104 mmol/L (ref 98–111)
Creatinine, Ser: 1.61 mg/dL — ABNORMAL HIGH (ref 0.61–1.24)
GFR, Estimated: 54 mL/min — ABNORMAL LOW (ref >60)
Glucose, Bld: 93 mg/dL (ref 70–99)
Potassium: 4.3 mmol/L (ref 3.5–5.1)
Sodium: 136 mmol/L (ref 135–145)

## 2022-11-20 LAB — HEPATIC FUNCTION PANEL
ALT: 18 U/L (ref 0–44)
AST: 16 U/L (ref 15–41)
Albumin: 4.1 g/dL (ref 3.5–5.0)
Alkaline Phosphatase: 68 U/L (ref 38–126)
Bilirubin, Direct: <0.1 mg/dL (ref 0.0–0.2)
Total Bilirubin: 0.5 mg/dL (ref 0.3–1.2)
Total Protein: 7.8 g/dL (ref 6.5–8.1)

## 2022-11-20 LAB — TROPONIN I (HIGH SENSITIVITY)
Troponin I (High Sensitivity): 13 ng/L (ref <18)
Troponin I (High Sensitivity): 12 ng/L (ref <18)

## 2022-11-20 LAB — ECHOCARDIOGRAM COMPLETE
AR max vel: 2.98 cm2
AV Area VTI: 2.98 cm2
AV Area mean vel: 2.91 cm2
AV Mean grad: 6 mmHg
AV Peak grad: 10.4 mmHg
Ao pk vel: 1.61 m/s
Area-P 1/2: 3.53 cm2
Calc EF: 57.6 %
Height: 72 in
P 1/2 time: 572 msec
S' Lateral: 3.6 cm
Single Plane A2C EF: 56.2 %
Single Plane A4C EF: 60.7 %
Weight: 5855.42 oz

## 2022-11-20 LAB — SEDIMENTATION RATE: Sed Rate: 22 mm/hr — ABNORMAL HIGH (ref 0–16)

## 2022-11-20 LAB — LIPASE, BLOOD: Lipase: 195 U/L — ABNORMAL HIGH (ref 11–51)

## 2022-11-20 LAB — BRAIN NATRIURETIC PEPTIDE: B Natriuretic Peptide: 46.5 pg/mL (ref 0.0–100.0)

## 2022-11-20 MED ORDER — LABETALOL HCL 100 MG PO TABS
300.0000 mg | ORAL_TABLET | Freq: Once | ORAL | Status: AC
  Administered 2022-11-20: 300 mg via ORAL
  Filled 2022-11-20: qty 3

## 2022-11-20 MED ORDER — ENOXAPARIN SODIUM 80 MG/0.8ML IJ SOSY
80.0000 mg | PREFILLED_SYRINGE | INTRAMUSCULAR | Status: DC
  Administered 2022-11-20: 80 mg via SUBCUTANEOUS
  Filled 2022-11-20: qty 0.8

## 2022-11-20 MED ORDER — FUROSEMIDE 10 MG/ML IJ SOLN
20.0000 mg | Freq: Once | INTRAMUSCULAR | Status: AC
  Administered 2022-11-20: 20 mg via INTRAVENOUS
  Filled 2022-11-20: qty 4

## 2022-11-20 MED ORDER — AMLODIPINE BESYLATE 10 MG PO TABS
10.0000 mg | ORAL_TABLET | Freq: Every day | ORAL | Status: DC
  Administered 2022-11-21: 10 mg via ORAL
  Filled 2022-11-20: qty 1

## 2022-11-20 MED ORDER — SPIRONOLACTONE 25 MG PO TABS
25.0000 mg | ORAL_TABLET | Freq: Every day | ORAL | Status: DC
  Administered 2022-11-21: 25 mg via ORAL
  Filled 2022-11-20: qty 1

## 2022-11-20 MED ORDER — ACETAMINOPHEN 325 MG PO TABS
650.0000 mg | ORAL_TABLET | Freq: Four times a day (QID) | ORAL | Status: DC | PRN
  Administered 2022-11-20: 650 mg via ORAL
  Filled 2022-11-20: qty 2

## 2022-11-20 MED ORDER — LABETALOL HCL 300 MG PO TABS
300.0000 mg | ORAL_TABLET | Freq: Three times a day (TID) | ORAL | Status: DC
  Administered 2022-11-20 – 2022-11-21 (×2): 300 mg via ORAL
  Filled 2022-11-20 (×4): qty 1

## 2022-11-20 MED ORDER — ISOSORBIDE MONONITRATE ER 30 MG PO TB24
30.0000 mg | ORAL_TABLET | Freq: Every day | ORAL | Status: DC
  Administered 2022-11-20 – 2022-11-21 (×2): 30 mg via ORAL
  Filled 2022-11-20 (×2): qty 1

## 2022-11-20 MED ORDER — ASPIRIN 81 MG PO CHEW
324.0000 mg | CHEWABLE_TABLET | Freq: Once | ORAL | Status: AC
  Administered 2022-11-20: 324 mg via ORAL
  Filled 2022-11-20: qty 4

## 2022-11-20 MED ORDER — IRBESARTAN 150 MG PO TABS
150.0000 mg | ORAL_TABLET | Freq: Every day | ORAL | Status: DC
  Administered 2022-11-21: 150 mg via ORAL
  Filled 2022-11-20: qty 1

## 2022-11-20 MED ORDER — PERFLUTREN LIPID MICROSPHERE
1.0000 mL | INTRAVENOUS | Status: AC | PRN
  Administered 2022-11-20: 2 mL via INTRAVENOUS

## 2022-11-20 MED ORDER — HYDRALAZINE HCL 50 MG PO TABS
100.0000 mg | ORAL_TABLET | Freq: Three times a day (TID) | ORAL | Status: DC
  Administered 2022-11-20 – 2022-11-21 (×3): 100 mg via ORAL
  Filled 2022-11-20 (×5): qty 2

## 2022-11-20 MED ORDER — ONDANSETRON HCL 4 MG PO TABS: 4.0000 mg | ORAL_TABLET | Freq: Four times a day (QID) | ORAL | Status: DC | PRN

## 2022-11-20 MED ORDER — ONDANSETRON HCL 4 MG/2ML IJ SOLN: 4.0000 mg | Freq: Four times a day (QID) | INTRAMUSCULAR | Status: DC | PRN

## 2022-11-20 MED ORDER — PANTOPRAZOLE SODIUM 40 MG PO TBEC
40.0000 mg | DELAYED_RELEASE_TABLET | Freq: Every day | ORAL | Status: DC
  Administered 2022-11-20 – 2022-11-21 (×2): 40 mg via ORAL
  Filled 2022-11-20 (×2): qty 1

## 2022-11-20 MED ORDER — FENTANYL CITRATE PF 50 MCG/ML IJ SOSY
50.0000 ug | PREFILLED_SYRINGE | Freq: Once | INTRAMUSCULAR | Status: AC
  Administered 2022-11-20: 50 ug via INTRAVENOUS
  Filled 2022-11-20: qty 1

## 2022-11-20 MED ORDER — TRAMADOL HCL 50 MG PO TABS
50.0000 mg | ORAL_TABLET | Freq: Four times a day (QID) | ORAL | Status: DC | PRN
  Administered 2022-11-20 – 2022-11-21 (×2): 50 mg via ORAL
  Filled 2022-11-20 (×2): qty 1

## 2022-11-20 MED ORDER — ACETAMINOPHEN 650 MG RE SUPP: 650.0000 mg | Freq: Four times a day (QID) | RECTAL | Status: DC | PRN

## 2022-11-20 MED ORDER — ALUM & MAG HYDROXIDE-SIMETH 200-200-20 MG/5ML PO SUSP
30.0000 mL | ORAL | Status: DC | PRN
  Administered 2022-11-20 – 2022-11-21 (×2): 30 mL via ORAL
  Filled 2022-11-20 (×2): qty 30

## 2022-11-20 MED ORDER — ALUM & MAG HYDROXIDE-SIMETH 200-200-20 MG/5ML PO SUSP
30.0000 mL | Freq: Once | ORAL | Status: AC
  Administered 2022-11-20: 30 mL via ORAL
  Filled 2022-11-20: qty 30

## 2022-11-20 NOTE — ED Notes (Signed)
ED TO INPATIENT HANDOFF REPORT  ED Nurse Name and Phone #: Clarise Cruz [phone]  S Name/Age/Gender Calvin Bailey 42 y.o. male Room/Bed: WA18/WA18  Code Status   Code Status: Prior  Home/SNF/Other Patient oriented to: self, place, time, and situation Is this baseline? Yes   Triage Complete: Triage complete  Chief Complaint Hypertensive urgency [I16.0]  Triage Note Pt reports migrating chest pain x1 week, goes chest to back to neck, near arms. Seen recently for same, been using tramadol with no relief. SHOB with exertion. Hx HTN, taking meds. Denies visual changes.   Allergies Allergies  Allergen Reactions   Chlorthalidone Nausea Only   Lisinopril Cough    Level of Care/Admitting Diagnosis ED Disposition     ED Disposition  Admit   Condition  --   Comment  Hospital Area: [facility] [[id number]]  Level of Care: Telemetry [5]  Admit to tele based on following criteria: Monitor for Ischemic changes  May place patient in observation at [facility] or [place] if equivalent level of care is available:: No  Covid Evaluation: Asymptomatic - no recent exposure (last 10 days) testing not required  Diagnosis: Hypertensive urgency [id number]  Admitting Physician: Reubin Milan [id number]  Attending Physician: Reubin Milan [id number]          B Medical/Surgery History Past Medical History:  Diagnosis Date   Dizziness 09/25/2019   GERD (gastroesophageal reflux disease)    HTN (hypertension)    Obesity hypoventilation syndrome ([place]) 04/08/2014   OSA (obstructive sleep apnea) 04/08/2014   PFO (patent foramen ovale)    Sleep apnea    Past Surgical History:  Procedure Laterality Date   two knee surgeries Left 1995     A IV Location/Drains/Wounds Patient Lines/Drains/Airways Status     Active Line/Drains/Airways     Name Placement date Placement time Site Days   Peripheral IV 11/20/22 20 G 1" Right Antecubital 11/20/22  0600   Antecubital  less than 1            Intake/Output Last 24 hours No intake or output data in the 24 hours ending 11/20/22 1247  Labs/Imaging Results for orders placed or performed during the hospital encounter of 11/20/22 (from the past 48 hour(s))  Basic metabolic panel     Status: Abnormal   Collection Time: 11/20/22  5:36 AM  Result Value Ref Range   Sodium 136 135 - 145 mmol/L   Potassium 4.3 3.5 - 5.1 mmol/L   Chloride 104 98 - 111 mmol/L   CO2 23 22 - 32 mmol/L   Glucose, Bld 93 70 - 99 mg/dL    Comment: Glucose reference range applies only to samples taken after fasting for at least 8 hours.   BUN 29 (H) 6 - 20 mg/dL   Creatinine, Ser 1.61 (H) 0.61 - 1.24 mg/dL   Calcium 8.9 8.9 - 10.3 mg/dL   GFR, Estimated 54 (L) >60 mL/min    Comment: (NOTE) Calculated using the CKD-EPI Creatinine Equation (2021)    Anion gap 9 5 - 15    Comment: Performed at [facility], [place] [address], [place], [place] [address]  CBC     Status: Abnormal   Collection Time: 11/20/22  5:36 AM  Result Value Ref Range   WBC 11.3 (H) 4.0 - 10.5 K/uL   RBC 5.12 4.22 - 5.81 MIL/uL   Hemoglobin 14.7 13.0 - 17.0 g/dL   HCT 44.7 39.0 - 52.0 %   MCV 87.3  80.0 - 100.0 fL   MCH 28.7 26.0 - 34.0 pg   MCHC 32.9 30.0 - 36.0 g/dL   RDW 12.2 11.5 - 15.5 %   Platelets 313 150 - 400 K/uL   nRBC 0.0 0.0 - 0.2 %    Comment: Performed at [facility], [place] [address] [place], [place] [address]  Troponin I (High Sensitivity)     Status: None   Collection Time: 11/20/22  5:36 AM  Result Value Ref Range   Troponin I (High Sensitivity) 13 <18 ng/L    Comment: (NOTE) Elevated high sensitivity troponin I (hsTnI) values and significant  changes across serial measurements may suggest ACS but many other  chronic and acute conditions are known to elevate hsTnI results.  Refer to the "Links" section for chest pain algorithms and additional  guidance. Performed at [facility], [place] [address], [place], [place] [address]   Brain natriuretic peptide     Status: None   Collection Time: 11/20/22  5:36 AM  Result Value Ref Range   B Natriuretic Peptide 46.5 0.0 - 100.0 pg/mL    Comment: Performed at [facility], [place] [address], [place], [place] [address]  Hepatic function panel     Status: None   Collection Time: 11/20/22  5:36 AM  Result Value Ref Range   Total Protein 7.8 6.5 - 8.1 g/dL   Albumin 4.1 3.5 - 5.0 g/dL   AST 16 15 - 41 U/L   ALT 18 0 - 44 U/L   Alkaline Phosphatase 68 38 - 126 U/L   Total Bilirubin 0.5 0.3 - 1.2 mg/dL   Bilirubin, Direct <0.1 0.0 - 0.2 mg/dL    Comment: REPEATED TO VERIFY   Indirect Bilirubin NOT CALCULATED 0.3 - 0.9 mg/dL    Comment: Performed at [facility], [place] [address] [place], [place] [address]  Lipase, blood     Status: Abnormal   Collection Time: 11/20/22  5:36 AM  Result Value Ref Range   Lipase 195 (H) 11 - 51 U/L    Comment: Performed at [facility], [place] [address], [place], [place] [address]  C-reactive protein     Status: Abnormal   Collection Time: 11/20/22  7:24 AM  Result Value Ref Range   CRP 1.5 (H) <1.0 mg/dL    Comment: Performed at [place] [address], [place], [place] [address]  Troponin I (High Sensitivity)     Status: None   Collection Time: 11/20/22  7:33 AM  Result Value Ref Range   Troponin I (High Sensitivity) 12 <18 ng/L    Comment: (NOTE) Elevated high sensitivity troponin I (hsTnI) values and significant  changes across serial measurements may suggest ACS but many other  chronic and acute conditions are known to elevate hsTnI results.  Refer to the "Links" section for chest pain algorithms and additional  guidance. Performed at [facility], [place] [address] [place], [place] [address]   Sedimentation rate     Status: Abnormal   Collection Time: 11/20/22  7:33 AM  Result  Value Ref Range   Sed Rate 22 (H) 0 - 16 mm/hr    Comment: Performed at [facility], [place] [address] [place], [place] [address]   DG Chest Port 1 View  Result Date: 11/20/2022 CLINICAL DATA:  [id number] with chest pain EXAM: PORTABLE CHEST 1 VIEW COMPARISON:  CTA chest 11/13/2022. FINDINGS: There is mild cardiomegaly  without overt CHF findings. The mediastinum is stable. Both lungs are clear. The visualized skeletal structures are unremarkable. IMPRESSION: Mild cardiomegaly without overt CHF findings.  Both lungs are clear. Electronically Signed   By: Telford Nab M.D.   On: 11/20/2022 06:27    Pending Labs Unresulted Labs (From admission, onward)    None       Vitals/Pain Today's Vitals   11/20/22 0959 11/20/22 1000 11/20/22 1017 11/20/22 1023  BP:  (!) 142/72    Pulse:  (!) 58    Resp:  17    Temp: 98.4 F (36.9 C) 98.4 F (36.9 C)    TempSrc: Oral Oral    SpO2:  97%    Weight:   (!) 165.4 kg (!) 165.4 kg  Height:   '6\' 1"'$  (1.854 m)   PainSc:        Isolation Precautions No active isolations  Medications Medications  amLODipine (NORVASC) tablet 10 mg (has no administration in time range)  labetalol (NORMODYNE) tablet 300 mg (has no administration in time range)  traMADol (ULTRAM) tablet 50 mg (has no administration in time range)  spironolactone (ALDACTONE) tablet 25 mg (has no administration in time range)  irbesartan (AVAPRO) tablet 150 mg (has no administration in time range)  hydrALAZINE (APRESOLINE) tablet 100 mg (100 mg Oral Given 11/20/22 1014)  isosorbide mononitrate (IMDUR) 24 hr tablet 30 mg (has no administration in time range)  labetalol (NORMODYNE) tablet 300 mg (300 mg Oral Given by Other 11/20/22 0635)  fentaNYL (SUBLIMAZE) injection 50 mcg (50 mcg Intravenous Given 11/20/22 0629)  alum & mag hydroxide-simeth (MAALOX/MYLANTA) 200-200-20 MG/5ML suspension 30 mL (30 mLs Oral Given 11/20/22 0720)  aspirin chewable tablet 324 mg (324 mg Oral  Given 11/20/22 0820)  furosemide (LASIX) injection 20 mg (20 mg Intravenous Given 11/20/22 0820)    Mobility walks     Focused Assessments    R Recommendations: See Admitting Provider Note  Report given to:   Additional Notes:

## 2022-11-20 NOTE — Consult Note (Signed)
Cardiology Consult:   Patient ID: ACY OBARA; MRN: [id number]; DOB: October 19, 1980   Admission date: 11/20/2022  Primary Care Provider: [place], Blondell Reveal, MD Primary Cardiologist: Dr. Harriet Masson Primary Electrophysiologist:  None  Chief Complaint:  Chest pain with HTN Emergency  Patient Profile:   Calvin Bailey is a 42 y.o. male with a history of HTN, OSA, HLD, Tobacco abuse, complicated by CKD (variable creatinine~ 1.6) With Complex HTN (Nausea with chlorthalidone, cough with ACEi), morbid obesity on [place]. Who is presenting for chest pain  History of Present Illness:   Calvin Bailey is feeling persistent chest pain. He has had multiple types of chest pain. The chest pain that brought him to the ED was right side chest pain that woke him while he was dozing at work. His past chest pain when having HTN emergency was pain in the center of his check with headache that went to his jaw. He has had central chest pain presently as well that is still present. Has CP 11/13/22 with no PE or on CAC on CTPE.  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    No shortness of breath, DOE at work.  No PND or orthopnea.  No weight gain, leg swelling , or abdominal swelling.  No syncope or near syncope . Notes  no palpitations or funny heart beats.     Grandfather had MI when patient was young; unclear age.  Patient reports prior cardiac testing including  - an echocardiogram in 2022 with preserved EF and LVH from BP (ascending aorta normal when indexed to size)   Past Medical History:  Diagnosis Date   Dizziness 09/25/2019   GERD (gastroesophageal reflux disease)    HTN (hypertension)    Obesity hypoventilation syndrome ([facility]) 04/08/2014   OSA (obstructive sleep apnea) 04/08/2014   PFO (patent foramen ovale)    Sleep apnea     Past Surgical History:  Procedure Laterality Date   two knee surgeries Left 1995     Medications Prior to  Admission: Prior to Admission medications   Medication Sig Start Date End Date Taking? Authorizing Provider  acetaminophen (TYLENOL) 500 MG tablet Take 1 tablet (500 mg total) by mouth every 6 (six) hours as needed. Patient taking differently: Take 1,000 mg by mouth every 6 (six) hours as needed for mild pain. 11/13/22  Yes Charlesetta Shanks, MD  amLODipine (NORVASC) 10 MG tablet Take 1 tablet (10 mg total) by mouth daily. 11/09/22  Yes Tobb, Kardie, DO  hydrALAZINE (APRESOLINE) 100 MG tablet Take 1 tablet (100 mg total) by mouth 3 (three) times daily. Patient taking differently: Take 100 mg by mouth 2 (two) times daily. 08/24/22  Yes Skeet Latch, MD  ibuprofen (ADVIL) 200 MG tablet Take 400 mg by mouth every 6 (six) hours as needed for headache or mild pain.   Yes [provider]  ketorolac (TORADOL) 10 MG tablet Take 10 mg by mouth 3 (three) times daily. 11/18/22  Yes [provider]  labetalol (NORMODYNE) 300 MG tablet Take 1 tablet (300 mg total) by mouth 2 (two) times daily. 08/24/22  Yes Skeet Latch, MD  Semaglutide-Weight Management [facility]) 1 MG/0.5ML SOAJ Inject 1 mg into the skin every 7 (seven) days. Patient taking differently: Inject 1 mg into the skin every Sunday. 08/24/22  Yes Skeet Latch, MD  spironolactone (ALDACTONE) 25 MG tablet TAKE 1 TABLET(25 MG) BY MOUTH DAILY Patient taking differently: Take 25 mg by mouth daily. 11/08/22  Yes Loel Dubonnet, NP  traMADol (ULTRAM) 50 MG tablet 1 to 2 tablets every 6 hours with extra strength Tylenol for pain as needed Patient taking differently: Take 50-100 mg by mouth every 6 (six) hours as needed for moderate pain. Can take with extra strength Tylenol for pain as needed 11/13/22  Yes Pfeiffer, Jeannie Done, MD  valsartan (DIOVAN) 160 MG tablet Take 1 tablet (160 mg total) by mouth 2 (two) times daily. 07/01/22  Yes Loel Dubonnet, NP  LISINOPRIL-HYDROCHLOROTHIAZIDE PO Take by mouth.  07/06/19  [provider]     Allergies:    Allergies  Allergen Reactions   Chlorthalidone Nausea Only   Lisinopril Cough    Social History:   Social History   Socioeconomic History   Marital status: Single    Spouse name: Not on file   Number of children: 2   Years of education: college   Highest education level: Some college, no degree  Occupational History   Occupation: unemployment    Fish farm manager: UNEMPLOYED    Employer: [employer]  Tobacco Use   Smoking status: Former    Packs/day: 0.10    Years: 16.00    Total pack years: 1.60    Types: Cigarettes    Quit date: 06/08/2022    Years since quitting: 0.4   Smokeless tobacco: Never  Vaping Use   Vaping Use: Never used  Substance and Sexual Activity   Alcohol use: Not Currently    Comment: sparingly    Drug use: No   Sexual activity: Yes    Partners: Female  Other Topics Concern   Not on file  Social History Narrative   Nature conservation officer   Patient is single and lives alone.   Patient is left-handed.   Patient does not drink any caffeine.   Social Determinants of Health   Financial Resource Strain: Low Risk  (07/01/2022)   Overall Financial Resource Strain (CARDIA)    Difficulty of Paying Living Expenses: Not hard at all  Food Insecurity: No Food Insecurity (07/01/2022)   Hunger Vital Sign    Worried About Running Out of Food in the Last Year: Never true    Ran Out of Food in the Last Year: Never true  Transportation Needs: No Transportation Needs (07/01/2022)   PRAPARE - Hydrologist (Medical): No    Lack of Transportation (Non-Medical): No  Physical Activity: Insufficiently Active (07/01/2022)   Exercise Vital Sign    Days of Exercise per Week: 3 days    Minutes of Exercise per Session: 30 min  Stress: No Stress Concern Present (07/01/2022)   [place]    Feeling of Stress : Only a little  Social Connections: Socially  Isolated (07/01/2022)   Social Connection and Isolation Panel [NHANES]    Frequency of Communication with Friends and Family: Once a week    Frequency of Social Gatherings with Friends and Family: Once a week    Attends Religious Services: 1 to 4 times per year    Active Member of [employer] or Organizations: No    Attends Music therapist: Never    Marital Status: Never married  Human resources officer Violence: Not on file    Family History:   The patient's family history includes CVA in his maternal uncle; Diabetes in his maternal grandmother; Heart failure in his maternal grandfather and paternal grandfather; Hyperlipidemia in his paternal grandfather; Hypertension in his brother, father, and  mother; Multiple sclerosis in his mother.    ROS:  Please see the history of present illness.  All other ROS reviewed and negative.     Physical Exam/Data:   Vitals:   11/20/22 0610 11/20/22 0611 11/20/22 0630 11/20/22 0700  BP:  (!) 161/99 (!) 172/109 (!) 156/100  Pulse:   (!) 59 (!) 58  Resp:   16 11  Temp: 98 F (36.7 C)     TempSrc: Oral     SpO2:   98% 100%   No intake or output data in the 24 hours ending 11/20/22 0826 There were no vitals filed for this visit. There is no height or weight on file to calculate BMI.   Gen: no distress, morbid obesity   Neck: Thick neck cannot assess JVD Cardiac: No Rubs or Gallops, no Murmur, bradycardia, +2 radial pulses Respiratory: Clear to auscultation bilaterally, normal effort, normal  respiratory rate GI: Soft, nontender, non-distended  MS: No  edema;  moves all extremities Integument: Skin feels warm Neuro:  At time of evaluation, alert and oriented to person/place/time/situation  Psych: Normal affect, patient feels ok  EKG:  The ECG that was done  was personally reviewed and demonstrates SR 1st HB  Relevant CV Studies:  ECHO COMPLETE WITH IMAGING ENHANCING AGENT 10/23/2020  Narrative ECHOCARDIOGRAM REPORT    Patient Name:    YSIDRO ACHENBACH Date of Exam: 10/23/2020 Medical Rec #:  [id number]        Height:       73.0 in Accession #:    [id number]       Weight:       399.0 lb Date of Birth:  20-Feb-1981        BSA:          2.886 m Patient Age:    16 years         BP:           150/88 mmHg Patient Gender: M                HR:           66 bpm. Exam Location:  [place]  Procedure: 2D Echo, Cardiac Doppler, Color Doppler and Intracardiac Opacification Agent  Indications:    R60.0 Lower extremity edema  History:        Patient has prior history of Echocardiogram examinations, most recent 07/15/2017. CKD; Risk Factors:Hypertension, Current Smoker, Sleep Apnea and Dyslipidemia. Patient denies SOB, chest pain. He states he has leg edema for more than 10 years.  Sonographer:    Salvadore Dom RVT, RDCS (AE), RDMS Referring Phys: [id number] KARDIE TOBB   Sonographer Comments: Patient is morbidly obese and Technically challenging study due to limited acoustic windows. Image acquisition challenging due to patient body habitus. IMPRESSIONS   1. Left ventricular ejection fraction, by estimation, is 60 to 65%. The left ventricle has normal function. The left ventricle has no regional wall motion abnormalities. Left ventricular diastolic parameters are consistent with Grade I diastolic dysfunction (impaired relaxation). 2. Right ventricular systolic function is normal. The right ventricular size is normal. 3. Left atrial size was moderately dilated. 4. The mitral valve is normal in structure. No evidence of mitral valve regurgitation. No evidence of mitral stenosis. 5. The aortic valve is normal in structure. Aortic valve regurgitation is mild. No aortic stenosis is present. 6. There is mild dilatation of the ascending aorta, measuring 38 mm. 7. The inferior vena cava is normal in size with greater than  50% respiratory variability, suggesting right atrial pressure of 3 mmHg.  Comparison(s): EF 55-60%,  LVH.  FINDINGS Left Ventricle: Left ventricular ejection fraction, by estimation, is 60 to 65%. The left ventricle has normal function. The left ventricle has no regional wall motion abnormalities. Definity contrast agent was given IV to delineate the left ventricular endocardial borders. The left ventricular internal cavity size was normal in size. There is borderline left ventricular hypertrophy. Left ventricular diastolic parameters are consistent with Grade I diastolic dysfunction (impaired relaxation).  Right Ventricle: The right ventricular size is normal. No increase in right ventricular wall thickness. Right ventricular systolic function is normal.  Left Atrium: Left atrial size was moderately dilated.  Right Atrium: Right atrial size was normal in size.  Pericardium: There is no evidence of pericardial effusion.  Mitral Valve: The mitral valve is normal in structure. No evidence of mitral valve regurgitation. No evidence of mitral valve stenosis.  Tricuspid Valve: The tricuspid valve is normal in structure. Tricuspid valve regurgitation is not demonstrated. No evidence of tricuspid stenosis.  Aortic Valve: The aortic valve is normal in structure. Aortic valve regurgitation is mild. No aortic stenosis is present. Aortic valve mean gradient measures 9.0 mmHg. Aortic valve peak gradient measures 16.3 mmHg. Aortic valve area, by VTI measures 2.60 cm.  Pulmonic Valve: The pulmonic valve was normal in structure. Pulmonic valve regurgitation is not visualized. No evidence of pulmonic stenosis.  Aorta: The aortic root is normal in size and structure. There is mild dilatation of the ascending aorta, measuring 38 mm.  Venous: The inferior vena cava is normal in size with greater than 50% respiratory variability, suggesting right atrial pressure of 3 mmHg.  IAS/Shunts: No atrial level shunt detected by color flow Doppler.   LEFT VENTRICLE PLAX 2D LVIDd:         5.41 cm   Diastology LVIDs:         3.05 cm  LV e' medial:    7.51 cm/s LV PW:         1.55 cm  LV E/e' medial:  14.1 LV IVS:        1.29 cm  LV e' lateral:   11.90 cm/s LVOT diam:     2.40 cm  LV E/e' lateral: 8.9 LV SV:         103 LV SV Index:   36 LVOT Area:     4.52 cm   RIGHT VENTRICLE RV S prime:     21.80 cm/s TAPSE (M-mode): 2.6 cm  LEFT ATRIUM              Index       RIGHT ATRIUM           Index LA diam:        4.50 cm  1.56 cm/m  RA Area:     24.80 cm LA Vol (A2C):   155.0 ml 53.71 ml/m RA Volume:   89.00 ml  30.84 ml/m LA Vol (A4C):   77.2 ml  26.75 ml/m LA Biplane Vol: 109.0 ml 37.77 ml/m AORTIC VALVE                    PULMONIC VALVE AV Area (Vmax):    2.69 cm     PV Vmax:       0.81 m/s AV Area (Vmean):   2.48 cm     PV Peak grad:  2.7 mmHg AV Area (VTI):     2.60 cm AV Vmax:  202.00 cm/s AV Vmean:          138.000 cm/s AV VTI:            0.396 m AV Peak Grad:      16.3 mmHg AV Mean Grad:      9.0 mmHg LVOT Vmax:         120.00 cm/s LVOT Vmean:        75.600 cm/s LVOT VTI:          0.228 m LVOT/AV VTI ratio: 0.58  AORTA Ao Root diam: 3.40 cm Ao Asc diam:  3.75 cm  MITRAL VALVE                TRICUSPID VALVE MV Area (PHT): 3.02 cm     TR Peak grad:   12.2 mmHg MV Decel Time: 251 msec     TR Vmax:        175.00 cm/s MV E velocity: 106.00 cm/s MV A velocity: 110.00 cm/s  SHUNTS MV E/A ratio:  0.96         Systemic VTI:  0.23 m Systemic Diam: 2.40 cm  Jyl Heinz MD Electronically signed by Jyl Heinz MD Signature Date/Time: 10/23/2020/5:15:26 PM    Final     Laboratory Data:  Chemistry Recent Labs  Lab 11/20/22 0536  NA 136  K 4.3  CL 104  CO2 23  GLUCOSE 93  BUN 29*  CREATININE 1.61*  CALCIUM 8.9  GFRNONAA 54*  ANIONGAP 9    Recent Labs  Lab 11/13/22 0919 11/20/22 0536  PROT 7.0 7.8  ALBUMIN 3.7 4.1  AST 14* 16  ALT 16 18  ALKPHOS 70 68  BILITOT 0.6 0.5   Hematology Recent Labs  Lab 11/20/22 0536  WBC  11.3*  RBC 5.12  HGB 14.7  HCT 44.7  MCV 87.3  MCH 28.7  MCHC 32.9  RDW 12.2  PLT 313   Cardiac EnzymesNo results for input(s): "TROPONINI" in the last 168 hours. No results for input(s): "TROPIPOC" in the last 168 hours.  BNP Recent Labs  Lab 11/20/22 0536  BNP 46.5    DDimer No results for input(s): "DDIMER" in the last 168 hours.  Radiology/Studies:  [facility] Chest Port 1 View  Result Date: 11/20/2022 CLINICAL DATA:  [id number] with chest pain EXAM: PORTABLE CHEST 1 VIEW COMPARISON:  CTA chest 11/13/2022. FINDINGS: There is mild cardiomegaly without overt CHF findings. The mediastinum is stable. Both lungs are clear. The visualized skeletal structures are unremarkable. IMPRESSION: Mild cardiomegaly without overt CHF findings.  Both lungs are clear. Electronically Signed   By: Telford Nab M.D.   On: 11/20/2022 06:27    Assessment and Plan:   Chest pain syndrome - with HTN and htn urgency (his BP is controlled and he is still symptomatic) - with former tobacco use, morbid obesity and CKD stage IIIA - continue BP regimen and will add Imdur 30 mg; discuss SE of headache - he should get definitive ischemic work up inpatient given multiple episodes: based on echo today will plan for LHC or CCTA Monday unless AKI  HTN  1st HB -Norvasc 10 mg (LE swelling did not stop) -Valsartan 160 BID -Hydralazine TID - aldactone 25 mg - with CP; Imdur 30  - heart rate preclude significant BB add; would be cautious with clonidine add - minoxidil if Imdur not tolerated - I do not see his catecholamine and metanephrine, cortisol testing resulted TSH has been WNL - no evidence of renal artery stenosis He  is compliant with CPAP  - pending HTN clinic work up for Renal Artery denveration; given CKD has not further progressed would recommend this  Morbid obesity  - hold [facility] inpatient    CKDIII  - SGLT2i start this admission would be reasonable     For questions or updates, please contact [facility] Please consult [email or web address] for contact info under Cardiology/STEMI.   Rudean Haskell, MD [facility] [place], [address] [place], [place] [address] [phone]  8:26 AM

## 2022-11-20 NOTE — ED Provider Notes (Signed)
Care transferred from Dr. Ralene Bathe at shift change.  Patient with a 1 week history of constant chest pressure going to his jaw and back.  Dyspnea on exertion since yesterday with elevated blood pressure. Had been out of labetalol.  Recent CT PE study was negative.  Troponin is negative.  No significant pulmonary edema on chest x-ray.  Awaiting callback from cardiology to discuss possibility of inpatient echo versus admission for diuresis and blood pressure control.  Able to ambulate without desaturation.  Discussed with Dr. Domenic Polite of cardiology.  Agrees would be reasonable to admit for blood pressure control, diuresis and potentially update echocardiogram.  They will consult.  Recommends hospitalist admission.   Ezequiel Essex, MD 11/20/22 [phone]

## 2022-11-20 NOTE — Progress Notes (Signed)
  Echocardiogram 2D Echocardiogram has been performed.  Lana Fish 11/20/2022, 4:58 PM

## 2022-11-20 NOTE — H&P (Signed)
History and Physical    Patient: Calvin Bailey [id number] DOB: [birth year] DOA: 11/20/2022 DOS: the patient was seen and examined on 11/20/2022 PCP: de [place], [name], MD  Patient coming from: Home  Chief Complaint:  Chief Complaint  Patient presents with   Chest Pain   HPI: Calvin Bailey is a 42 y.o. male with medical history significant of dizziness, GERD, hypertension, LVH, chronic diastolic heart failure, history of expressive aphasia, chronic headaches, sleep apnea, obesity hypoventilation syndrome, PFO, stage IIIa CKD, tobacco use disorder in remission for the last week who has been having chest pain on and off for the past week.  He was seen in the emergency department several days ago and had a benign workup.  He was discharged home.  He is returning again with precordial chest pain, dull in nature, radiated to his back and neck.  No alleviating or relieving factors.   No palpitations, diaphoresis, PND, orthopnea, but has occasional pitting edema of the lower extremities. He denied fever, chills, rhinorrhea, sore throat, wheezing or hemoptysis. No abdominal pain, nausea, emesis, diarrhea, constipation, melena or hematochezia.  No flank pain, dysuria, frequency or hematuria.  No polyuria, polydipsia, polyphagia or blurred vision.   ED course: Initial vital signs were temperature 98 F, pulse 63, respirations 16, BP 161/99 mmHg O2 sat 98% on room air.  The patient received aspirin 324 mg p.o., fentanyl 50 mcg p.o., furosemide 20 mg IVP and 300 mg of labetalol p.o.  Lab work: CBC with a white count 11.3, hemoglobin 14.7 and platelets 313.  Troponin x 2 normal.  BNP unremarkable.  BMP with normal electrolytes and glucose, BUN 29 and creatinine 1.61 mg deciliter which is around the patient's baseline.  LFTs were normal.  CRP 1.5 and sed rate 22.  Lipase was 195 units/L.  Imaging: Portable 1 view chest radiograph with mild cardiomegaly without overt CHF.  Both lobes are clear.    Review of Systems: As mentioned in the history of present illness. All other systems reviewed and are negative. Past Medical History:  Diagnosis Date   Dizziness 09/25/2019   GERD (gastroesophageal reflux disease)    HTN (hypertension)    Obesity hypoventilation syndrome (HCC) 04/08/2014   OSA (obstructive sleep apnea) 04/08/2014   PFO (patent foramen ovale)    Sleep apnea    Past Surgical History:  Procedure Laterality Date   two knee surgeries Left 1995   Social History:  reports that he quit smoking about 5 months ago. His smoking use included cigarettes. He has a 1.60 pack-year smoking history. He has never used smokeless tobacco. He reports that he does not currently use alcohol. He reports that he does not use drugs.  Allergies  Allergen Reactions   Chlorthalidone Nausea Only   Lisinopril Cough    Family History  Problem Relation Age of Onset   Hypertension Mother    Multiple sclerosis Mother    Hypertension Father    Hypertension Brother    Hyperlipidemia Paternal Grandfather    Heart failure Paternal Grandfather    Diabetes Maternal Grandmother    Heart failure Maternal Grandfather    CVA Maternal Uncle     Prior to Admission medications   Medication Sig Start Date End Date Taking? Authorizing Provider  acetaminophen (TYLENOL) 500 MG tablet Take 1 tablet (500 mg total) by mouth every 6 (six) hours as needed. Patient taking differently: Take 1,000 mg by mouth every 6 (six) hours as needed for mild pain. 11/13/22  Yes Pfeiffer,  Jeannie Done, MD  amLODipine (NORVASC) 10 MG tablet Take 1 tablet (10 mg total) by mouth daily. 11/09/22  Yes Tobb, Kardie, DO  hydrALAZINE (APRESOLINE) 100 MG tablet Take 1 tablet (100 mg total) by mouth 3 (three) times daily. Patient taking differently: Take 100 mg by mouth 2 (two) times daily. 08/24/22  Yes Skeet Latch, MD  ibuprofen (ADVIL) 200 MG tablet Take 400 mg by mouth every 6 (six) hours as needed for headache or mild pain.   Yes  [provider]  ketorolac (TORADOL) 10 MG tablet Take 10 mg by mouth 3 (three) times daily. 11/18/22  Yes [provider]  labetalol (NORMODYNE) 300 MG tablet Take 1 tablet (300 mg total) by mouth 2 (two) times daily. 08/24/22  Yes Skeet Latch, MD  Semaglutide-Weight Management [facility]) 1 MG/0.5ML SOAJ Inject 1 mg into the skin every 7 (seven) days. Patient taking differently: Inject 1 mg into the skin every Sunday. 08/24/22  Yes Skeet Latch, MD  spironolactone (ALDACTONE) 25 MG tablet TAKE 1 TABLET(25 MG) BY MOUTH DAILY Patient taking differently: Take 25 mg by mouth daily. 11/08/22  Yes Loel Dubonnet, NP  traMADol (ULTRAM) 50 MG tablet 1 to 2 tablets every 6 hours with extra strength Tylenol for pain as needed Patient taking differently: Take 50-100 mg by mouth every 6 (six) hours as needed for moderate pain. Can take with extra strength Tylenol for pain as needed 11/13/22  Yes Pfeiffer, Jeannie Done, MD  valsartan (DIOVAN) 160 MG tablet Take 1 tablet (160 mg total) by mouth 2 (two) times daily. 07/01/22  Yes Loel Dubonnet, NP  LISINOPRIL-HYDROCHLOROTHIAZIDE PO Take by mouth.  07/06/19  [provider]    Physical Exam: Vitals:   11/20/22 0610 11/20/22 0611 11/20/22 0630 11/20/22 0700  BP:  (!) 161/99 (!) 172/109 (!) 156/100  Pulse:   (!) 59 (!) 58  Resp:   16 11  Temp: 98 F (36.7 C)     TempSrc: Oral     SpO2:   98% 100%   Physical Exam Vitals and nursing note reviewed.  Constitutional:      General: He is awake. He is not in acute distress.    Appearance: He is well-developed. He is morbidly obese.  HENT:     Head: Normocephalic.     Nose: No rhinorrhea.     Mouth/Throat:     Mouth: Mucous membranes are moist.  Eyes:     General: No scleral icterus.    Pupils: Pupils are equal, round, and reactive to light.  Neck:     Vascular: No JVD.  Cardiovascular:     Rate and Rhythm: Normal rate and regular rhythm.     Heart sounds: S1 normal and  S2 normal.     Comments: Nonpitting edema.  Stage II lymphedema. Pulmonary:     Breath sounds: No wheezing, rhonchi or rales.  Abdominal:     General: Bowel sounds are normal. There is no distension.     Palpations: Abdomen is soft.     Tenderness: There is no abdominal tenderness. There is no right CVA tenderness, left CVA tenderness or guarding.  Musculoskeletal:     Cervical back: Neck supple.     Right lower leg: Edema present.     Left lower leg: Edema present.  Skin:    General: Skin is warm and dry.  Neurological:     General: No focal deficit present.     Mental Status: He is alert and oriented to person,  place, and time.  Psychiatric:        Mood and Affect: Mood normal.        Behavior: Behavior normal. Behavior is cooperative.     Data Reviewed:  Results are pending, will review when available.  02/27/2022 echocardiogram bubble study IMPRESSIONS:   1. Left ventricular ejection fraction, by estimation, is 60 to 65%. The  left ventricle has normal function. The left ventricle has no regional  wall motion abnormalities. There is moderate left ventricular hypertrophy.  Left ventricular diastolic  parameters are consistent with Grade II diastolic dysfunction  (pseudonormalization).   2. Right ventricular systolic function is normal. The right ventricular  size is normal.   3. PFO (~0.65 cm) identified with color doppler showing left to right  shunt.   4. No evidence of mitral valve regurgitation.   5. Aortic valve regurgitation is mild.   6. The inferior vena cava is normal in size with greater than 50%  respiratory variability, suggesting right atrial pressure of 3 mmHg.   Assessment and Plan: Principal Problem:   Chest pain In the setting of:   Hypertensive urgency Observation/telemetry. Continue hydralazine 100 mg p.o. 3 times daily. Continue spironolactone 25 mg p.o. daily. Increase labetalol to 300 mg p.o. 3 times daily. Continue valsartan 160 mg p.o.  daily or formulary equivalent. Received furosemide 20 mg IVP earlier today. Will await for renal function in a.m. before adding diuretic. Cardiology has added Imdur 30 mg p.o. daily. Echocardiogram ordered by cardiology. Planning for CCTA on Monday if renal function allows.  Active Problems:   Chronic kidney disease, stage 3a ([place]) Monitor renal function electrolytes.    Chronic diastolic CHF (congestive heart failure) (HCC) Continue beta-blocker, spironolactone, ARB. Started on isosorbide mononitrate.    Gastroesophageal reflux disease without esophagitis Continue Maalox as needed. Begin pantoprazole 40 mg p.o. daily.    Nicotine dependence, cigarettes, uncomplicated In recent remission. Declined nicotine replacement therapy at the moment.    OSA (obstructive sleep apnea)   Obesity hypoventilation syndrome (HCC) Continue CPAP at bedtime.    Morbid obesity due to excess calories (HCC) Current BMI 49.63 kg/m. Briefly discussed lifestyle modifications. Follow-up with PCP and/or bariatric clinic.      Advance Care Planning:   Code Status: Full Code   Consults: Cardiology (Dr. Rudean Haskell).  Family Communication:   Severity of Illness: The appropriate patient status for this patient is INPATIENT. Inpatient status is judged to be reasonable and necessary in order to provide the required intensity of service to ensure the patient's safety. The patient's presenting symptoms, physical exam findings, and initial radiographic and laboratory data in the context of their chronic comorbidities is felt to place them at high risk for further clinical deterioration. Furthermore, it is not anticipated that the patient will be medically stable for discharge from the hospital within 2 midnights of admission.   * I certify that at the point of admission it is my clinical judgment that the patient will require inpatient hospital care spanning beyond 2 midnights from the point of  admission due to high intensity of service, high risk for further deterioration and high frequency of surveillance required.*  Author: Reubin Milan, MD 11/20/2022 9:26 AM  For on call review [email or web address].   This document was prepared using Dragon voice recognition software and may contain some unintended transcription errors.

## 2022-11-20 NOTE — ED Triage Notes (Signed)
Pt reports migrating chest pain x1 week, goes chest to back to neck, near arms. Seen recently for same, been using tramadol with no relief. SHOB with exertion. Hx HTN, taking meds. Denies visual changes.

## 2022-11-20 NOTE — ED Notes (Signed)
SPO2 stayed above 96% while ambulating

## 2022-11-20 NOTE — ED Provider Notes (Signed)
[place] EMERGENCY DEPARTMENT AT [facility] Provider Note   CSN: [id number] Arrival date & time: 11/20/22  [id number]     History  Chief Complaint  Patient presents with   Chest Pain    Calvin Bailey is a 42 y.o. male.  The history is provided by the patient and medical records.  Chest Pain Calvin Bailey is a 42 y.o. male who presents to the Emergency Department complaining of chest pain.  He presents to the emergency department for evaluation of chest pain that started last Thursday.  Pain initially started on the right chest and then migrated to the mid chest, jaw and back.  He describes it as a pressure and aching sensation that is constant in nature.  His pain does improve with laying down.  He also reports dyspnea on exertion that started yesterday.  He was initially evaluated in the emergency department and had a CTA PE as well as CT abdomen pelvis and cardiac enzymes obtained.  He was discharged home after reassuring workup and saw his doctor on Thursday, who prescribed Toradol.  He took 1 dose and did not like the way it made him feel and did not continue to take the medication.   No fever, cough, AP, N/V.   Has BLE edema - chronic for twenty years, left greater than right.   Hx/o CKD, HTN.  He is compliant with his home medications and does have severe hypertension.  He has been out of his labetalol since yesterday but took all of his other morning medications.  No hx/o DVT/PE     Home Medications Prior to Admission medications   Medication Sig Start Date End Date Taking? Authorizing Provider  ketorolac (TORADOL) 10 MG tablet Take 10 mg by mouth 3 (three) times daily. 11/18/22  Yes [provider]  acetaminophen (TYLENOL) 500 MG tablet Take 1 tablet (500 mg total) by mouth every 6 (six) hours as needed. 11/13/22   Charlesetta Shanks, MD  amLODipine (NORVASC) 10 MG tablet Take 1 tablet (10 mg total) by mouth daily. 11/09/22   Tobb, Kardie, DO  hydrALAZINE  (APRESOLINE) 100 MG tablet Take 1 tablet (100 mg total) by mouth 3 (three) times daily. 08/24/22   Skeet Latch, MD  ibuprofen (ADVIL) 200 MG tablet Take 600 mg by mouth 2 (two) times daily as needed for headache.    [provider]  labetalol (NORMODYNE) 300 MG tablet Take 1 tablet (300 mg total) by mouth 2 (two) times daily. 08/24/22   Skeet Latch, MD  Semaglutide-Weight Management [facility]) 1 MG/0.5ML SOAJ Inject 1 mg into the skin every 7 (seven) days. 08/24/22   Skeet Latch, MD  spironolactone (ALDACTONE) 25 MG tablet TAKE 1 TABLET(25 MG) BY MOUTH DAILY 11/08/22   Loel Dubonnet, NP  traMADol (ULTRAM) 50 MG tablet 1 to 2 tablets every 6 hours with extra strength Tylenol for pain as needed 11/13/22   Charlesetta Shanks, MD  valsartan (DIOVAN) 160 MG tablet Take 1 tablet (160 mg total) by mouth 2 (two) times daily. 07/01/22   Loel Dubonnet, NP  LISINOPRIL-HYDROCHLOROTHIAZIDE PO Take by mouth.  07/06/19  [provider]      Allergies    Chlorthalidone and Lisinopril    Review of Systems   Review of Systems  Cardiovascular:  Positive for chest pain.  All other systems reviewed and are negative.   Physical Exam Updated Vital Signs BP (!) 172/109   Pulse (!) 59   Temp 98 F (  36.7 C) (Oral)   Resp 16   SpO2 98%  Physical Exam Vitals and nursing note reviewed.  Constitutional:      Appearance: He is well-developed.  HENT:     Head: Normocephalic and atraumatic.  Cardiovascular:     Rate and Rhythm: Normal rate and regular rhythm.     Heart sounds: No murmur heard. Pulmonary:     Effort: Pulmonary effort is normal. No respiratory distress.     Breath sounds: Normal breath sounds.  Abdominal:     Palpations: Abdomen is soft.     Tenderness: There is no abdominal tenderness. There is no guarding or rebound.  Musculoskeletal:        General: No tenderness.     Comments: 2+ pitting edema to LLE, trace pitting edema to RLE.   Skin:    General:  Skin is warm and dry.  Neurological:     Mental Status: He is alert and oriented to person, place, and time.  Psychiatric:        Behavior: Behavior normal.     ED Results / Procedures / Treatments   Labs (all labs ordered are listed, but only abnormal results are displayed) Labs Reviewed  BASIC METABOLIC PANEL - Abnormal; Notable for the following components:      Result Value   BUN 29 (*)    Creatinine, Ser 1.61 (*)    GFR, Estimated 54 (*)    All other components within normal limits  CBC - Abnormal; Notable for the following components:   WBC 11.3 (*)    All other components within normal limits  BRAIN NATRIURETIC PEPTIDE  HEPATIC FUNCTION PANEL  LIPASE, BLOOD  SEDIMENTATION RATE  C-REACTIVE PROTEIN  TROPONIN I (HIGH SENSITIVITY)  TROPONIN I (HIGH SENSITIVITY)    EKG EKG Interpretation  Date/Time:  Saturday November 20 2022 05:31:46 EST Ventricular Rate:  64 PR Interval:  211 QRS Duration: 101 QT Interval:  396 QTC Calculation: 409 R Axis:   0 Text Interpretation: Sinus rhythm Prolonged PR interval Abnormal R-wave progression, early transition Confirmed by Quintella Reichert [phone]) on 11/20/2022 5:39:41 AM  Radiology DG Chest Port 1 View  Result Date: 11/20/2022 CLINICAL DATA:  [id number] with chest pain EXAM: PORTABLE CHEST 1 VIEW COMPARISON:  CTA chest 11/13/2022. FINDINGS: There is mild cardiomegaly without overt CHF findings. The mediastinum is stable. Both lungs are clear. The visualized skeletal structures are unremarkable. IMPRESSION: Mild cardiomegaly without overt CHF findings.  Both lungs are clear. Electronically Signed   By: Telford Nab M.D.   On: 11/20/2022 06:27    Procedures Procedures    Medications Ordered in ED Medications  labetalol (NORMODYNE) tablet 300 mg (300 mg Oral Given by Other 11/20/22 0635)  fentaNYL (SUBLIMAZE) injection 50 mcg (50 mcg Intravenous Given 11/20/22 0629)  alum & mag hydroxide-simeth (MAALOX/MYLANTA) 200-200-20 MG/5ML  suspension 30 mL (30 mLs Oral Given 11/20/22 0720)    ED Course/ Medical Decision Making/ A&P                             Medical Decision Making Amount and/or Complexity of Data Reviewed Labs: ordered. Radiology: ordered.  Risk OTC drugs. Prescription drug management.   Patient with history of hypertension, CKD here for evaluation of constant chest pain for greater than 1 week.  EKG is without acute ischemic changes.  He does have dyspnea on exertion, which is new over the last 24 hours.  Lungs are clear on  evaluation.  Initial troponin is normal.  Doubt PE/dissection, he had a negative CTA performed on February 17 for similar symptoms.  CBC with mild leukocytosis-unclear etiology of this.  His pain is improved after 1 dose of medications in the emergency department.  He was treated with a home dose of his labetalol.  He was hypertensive at time of ED arrival.  After his home medication he did have improvement in his blood pressure.  Patient care transferred pending additional labs.        Final Clinical Impression(s) / ED Diagnoses Final diagnoses:  None    Rx / DC Orders ED Discharge Orders     None         Quintella Reichert, MD 11/20/22 [phone]

## 2022-11-21 ENCOUNTER — Other Ambulatory Visit: Payer: Self-pay | Admitting: Neurology

## 2022-11-21 DIAGNOSIS — E662 Morbid (severe) obesity with alveolar hypoventilation: Secondary | ICD-10-CM

## 2022-11-21 DIAGNOSIS — K219 Gastro-esophageal reflux disease without esophagitis: Secondary | ICD-10-CM

## 2022-11-21 DIAGNOSIS — I5032 Chronic diastolic (congestive) heart failure: Secondary | ICD-10-CM | POA: Diagnosis not present

## 2022-11-21 DIAGNOSIS — I160 Hypertensive urgency: Secondary | ICD-10-CM | POA: Diagnosis not present

## 2022-11-21 DIAGNOSIS — F17210 Nicotine dependence, cigarettes, uncomplicated: Secondary | ICD-10-CM

## 2022-11-21 DIAGNOSIS — N1831 Chronic kidney disease, stage 3a: Secondary | ICD-10-CM | POA: Diagnosis not present

## 2022-11-21 LAB — BASIC METABOLIC PANEL
Anion gap: 7 (ref 5–15)
BUN: 32 mg/dL — ABNORMAL HIGH (ref 6–20)
CO2: 24 mmol/L (ref 22–32)
Calcium: 9 mg/dL (ref 8.9–10.3)
Chloride: 103 mmol/L (ref 98–111)
Creatinine, Ser: 1.73 mg/dL — ABNORMAL HIGH (ref 0.61–1.24)
GFR, Estimated: 50 mL/min — ABNORMAL LOW (ref >60)
Glucose, Bld: 93 mg/dL (ref 70–99)
Potassium: 4.1 mmol/L (ref 3.5–5.1)
Sodium: 134 mmol/L — ABNORMAL LOW (ref 135–145)

## 2022-11-21 LAB — CBC
HCT: 41.8 % (ref 39.0–52.0)
Hemoglobin: 13.8 g/dL (ref 13.0–17.0)
MCH: 29.2 pg (ref 26.0–34.0)
MCHC: 33 g/dL (ref 30.0–36.0)
MCV: 88.4 fL (ref 80.0–100.0)
Platelets: 284 10*3/uL (ref 150–400)
RBC: 4.73 MIL/uL (ref 4.22–5.81)
RDW: 12.3 % (ref 11.5–15.5)
WBC: 10.8 10*3/uL — ABNORMAL HIGH (ref 4.0–10.5)
nRBC: 0 % (ref 0.0–0.2)

## 2022-11-21 MED ORDER — ISOSORBIDE MONONITRATE ER 30 MG PO TB24: 30.0000 mg | ORAL_TABLET | Freq: Every day | ORAL | 2 refills | Status: DC

## 2022-11-21 MED ORDER — LABETALOL HCL 300 MG PO TABS: 300.0000 mg | ORAL_TABLET | Freq: Three times a day (TID) | ORAL | 3 refills | Status: DC

## 2022-11-21 MED ORDER — PANTOPRAZOLE SODIUM 40 MG PO TBEC: DELAYED_RELEASE_TABLET | ORAL | 0 refills | Status: DC

## 2022-11-21 NOTE — Progress Notes (Signed)
Progress Note  Patient Name: Calvin Bailey Date of Encounter: 11/21/2022  Primary Cardiologist: Godfrey Pick Tobb, DO   Subjective   Overnight LVEF was preserved. Patient notes that he had no change in symptoms with Imdur, but has better BP control. No HA associated with nitrates. He still have some chest pain; today his chest pain is described as worsened with eating. Blood pressure is well maintained  Inpatient Medications    Scheduled Meds:  amLODipine  10 mg Oral Daily   enoxaparin (LOVENOX) injection  80 mg Subcutaneous Q24H   hydrALAZINE  100 mg Oral TID   irbesartan  150 mg Oral Daily   isosorbide mononitrate  30 mg Oral Daily   labetalol  300 mg Oral TID   pantoprazole  40 mg Oral Daily   spironolactone  25 mg Oral Daily   Continuous Infusions:  PRN Meds: acetaminophen **OR** acetaminophen, alum & mag hydroxide-simeth, ondansetron **OR** ondansetron (ZOFRAN) IV, traMADol   Vital Signs    Vitals:   11/20/22 2131 11/20/22 2213 11/21/22 0204 11/21/22 0509  BP: 131/83 123/70 129/69 (!) 143/88  Pulse: 62 61 60 61  Resp:  '17 17 18  '$ Temp:  97.7 F (36.5 C) 97.7 F (36.5 C) (!) 97.3 F (36.3 C)  TempSrc:  Oral Oral Oral  SpO2:  99% 97% 96%  Weight:      Height:       No intake or output data in the 24 hours ending 11/21/22 0801 Filed Weights   11/20/22 1017 11/20/22 1023 11/20/22 1429  Weight: (!) 165.4 kg (!) 165.4 kg (!) 166 kg    Telemetry    SR - Personally Reviewed  Physical Exam   Gen: No distress, Morbid Obesity   Neck: thick neck- unable to assess JCD Cardiac: No Rubs or Gallops, no murmur, RRR +2 radial pulses Respiratory: Clear to auscultation bilaterally, normal effort, rorma  respiratory rate GI: Soft, nontender, non-distended  MS: No  edema;  moves all extremities; R AC PIV Integument: Skin feels warm Neuro:  At time of evaluation, alert and oriented to person/place/time/situation  Psych: Normal affect, patient feels ok   Labs     Chemistry Recent Labs  Lab 11/20/22 0536 11/21/22 0435  NA 136 134*  K 4.3 4.1  CL 104 103  CO2 23 24  GLUCOSE 93 93  BUN 29* 32*  CREATININE 1.61* 1.73*  CALCIUM 8.9 9.0  PROT 7.8  --   ALBUMIN 4.1  --   AST 16  --   ALT 18  --   ALKPHOS 68  --   BILITOT 0.5  --   GFRNONAA 54* 50*  ANIONGAP 9 7     Hematology Recent Labs  Lab 11/20/22 0536 11/21/22 0435  WBC 11.3* 10.8*  RBC 5.12 4.73  HGB 14.7 13.8  HCT 44.7 41.8  MCV 87.3 88.4  MCH 28.7 29.2  MCHC 32.9 33.0  RDW 12.2 12.3  PLT 313 284    Cardiac EnzymesNo results for input(s): "TROPONINI" in the last 168 hours. No results for input(s): "TROPIPOC" in the last 168 hours.   BNP Recent Labs  Lab 11/20/22 0536  BNP 46.5     DDimer No results for input(s): "DDIMER" in the last 168 hours.   Radiology    ECHOCARDIOGRAM COMPLETE  Result Date: 11/20/2022    ECHOCARDIOGRAM REPORT   Patient Name:   Calvin Bailey Date of Exam: 11/20/2022 Medical Rec #:  [id number]  Height:       72.0 in Accession #:    [id number]       Weight:       366.0 lb Date of Birth:  [birth year]        BSA:          2.754 m Patient Age:    42 years         BP:           132/75 mmHg Patient Gender: M                HR:           62 bpm. Exam Location:  Inpatient Procedure: 2D Echo and Intracardiac Opacification Agent Indications:    chest pain  History:        Patient has prior history of Echocardiogram examinations, most                 recent 02/27/2022. CHF; Risk Factors:Hypertension.  Sonographer:    Harvie Junior Referring Phys: [id number] [facility] A Gasper Sells  Sonographer Comments: Patient is obese. Image acquisition challenging due to patient body habitus. IMPRESSIONS  1. Left ventricular ejection fraction, by estimation, is 60 to 65%. The left ventricle has normal function. The left ventricle has no regional wall motion abnormalities. There is mild concentric left ventricular hypertrophy. Left ventricular diastolic parameters were normal.   2. Right ventricular systolic function is normal. The right ventricular size is normal. There is normal pulmonary artery systolic pressure.  3. The mitral valve is normal in structure. Trivial mitral valve regurgitation. No evidence of mitral stenosis.  4. The aortic valve is tricuspid. Aortic valve regurgitation is mild. No aortic stenosis is present.  5. The inferior vena cava is normal in size with greater than 50% respiratory variability, suggesting right atrial pressure of 3 mmHg. Comparison(s): No significant change from prior study. FINDINGS  Left Ventricle: Left ventricular ejection fraction, by estimation, is 60 to 65%. The left ventricle has normal function. The left ventricle has no regional wall motion abnormalities. Definity contrast agent was given IV to delineate the left ventricular  endocardial borders. The left ventricular internal cavity size was normal in size. There is mild concentric left ventricular hypertrophy. Left ventricular diastolic parameters were normal. Right Ventricle: The right ventricular size is normal. No increase in right ventricular wall thickness. Right ventricular systolic function is normal. There is normal pulmonary artery systolic pressure. The tricuspid regurgitant velocity is 2.24 m/s, and  with an assumed right atrial pressure of 3 mmHg, the estimated right ventricular systolic pressure is [id number] mmHg. Left Atrium: Left atrial size was normal in size. Right Atrium: Right atrial size was normal in size. Pericardium: There is no evidence of pericardial effusion. Mitral Valve: The mitral valve is normal in structure. Trivial mitral valve regurgitation. No evidence of mitral valve stenosis. Tricuspid Valve: The tricuspid valve is normal in structure. Tricuspid valve regurgitation is trivial. Aortic Valve: The aortic valve is tricuspid. Aortic valve regurgitation is mild. Aortic regurgitation PHT measures 572 msec. No aortic stenosis is present. Aortic valve mean gradient  measures 6.0 mmHg. Aortic valve peak gradient measures 10.4 mmHg. Aortic valve area, by VTI measures 2.98 cm. Pulmonic Valve: The pulmonic valve was normal in structure. Pulmonic valve regurgitation is trivial. Aorta: The aortic root is normal in size and structure. Venous: The inferior vena cava is normal in size with greater than 50% respiratory variability, suggesting right atrial pressure of 3 mmHg. IAS/Shunts: The atrial septum is  grossly normal.  LEFT VENTRICLE PLAX 2D LVIDd:         5.90 cm      Diastology LVIDs:         3.60 cm      LV e' medial:    7.94 cm/s LV PW:         1.50 cm      LV E/e' medial:  11.3 LV IVS:        1.50 cm      LV e' lateral:   11.00 cm/s LVOT diam:     2.10 cm      LV E/e' lateral: 8.2 LV SV:         103 LV SV Index:   38 LVOT Area:     3.46 cm  LV Volumes (MOD) LV vol d, MOD A2C: 133.0 ml LV vol d, MOD A4C: 215.0 ml LV vol s, MOD A2C: 58.2 ml LV vol s, MOD A4C: 84.5 ml LV SV MOD A2C:     74.8 ml LV SV MOD A4C:     215.0 ml LV SV MOD BP:      107.0 ml RIGHT VENTRICLE RV Basal diam:  3.80 cm RV Mid diam:    3.10 cm RV S prime:     15.70 cm/s TAPSE (M-mode): 2.0 cm LEFT ATRIUM             Index        RIGHT ATRIUM           Index LA diam:        3.90 cm 1.42 cm/m   RA Area:     17.30 cm LA Vol (A2C):   81.5 ml 29.59 ml/m  RA Volume:   43.70 ml  15.87 ml/m LA Vol (A4C):   79.9 ml 29.01 ml/m LA Biplane Vol: 82.4 ml 29.92 ml/m  AORTIC VALVE                     PULMONIC VALVE AV Area (Vmax):    2.98 cm      PV Vmax:       1.21 m/s AV Area (Vmean):   2.91 cm      PV Peak grad:  5.9 mmHg AV Area (VTI):     2.98 cm AV Vmax:           161.00 cm/s AV Vmean:          108.000 cm/s AV VTI:            0.347 m AV Peak Grad:      10.4 mmHg AV Mean Grad:      6.0 mmHg LVOT Vmax:         138.50 cm/s LVOT Vmean:        90.700 cm/s LVOT VTI:          0.298 m LVOT/AV VTI ratio: 0.86 AI PHT:            572 msec  AORTA Ao Root diam: 3.60 cm Ao Asc diam:  3.30 cm MITRAL VALVE                TRICUSPID VALVE MV Area (PHT): 3.53 cm    TR Peak grad:   20.1 mmHg MV Decel Time: 215 msec    TR Vmax:        224.00 cm/s MV E velocity: 89.70 cm/s MV A velocity: 78.70 cm/s  SHUNTS MV E/A ratio:  1.14  Systemic VTI:  0.30 m                            Systemic Diam: 2.10 cm Gwyndolyn Kaufman MD Electronically signed by Gwyndolyn Kaufman MD Signature Date/Time: 11/20/2022/5:09:49 PM    Final    DG Chest Port 1 View  Result Date: 11/20/2022 CLINICAL DATA:  [id number] with chest pain EXAM: PORTABLE CHEST 1 VIEW COMPARISON:  CTA chest 11/13/2022. FINDINGS: There is mild cardiomegaly without overt CHF findings. The mediastinum is stable. Both lungs are clear. The visualized skeletal structures are unremarkable. IMPRESSION: Mild cardiomegaly without overt CHF findings.  Both lungs are clear. Electronically Signed   By: Telford Nab M.D.   On: 11/20/2022 06:27     Patient Profile     42 y.o. male chest pain syndrome  Assessment & Plan    Chest pain syndrome - with HTN and presented with HTN emergency - BP controled on Amlodipine 10 mg, Valsartan 160 BID, Hydaralazine 100 mg PO TID, Aldactone 25 mg and new Imdur - with former tobacco use, morbid obesity and CKD stage IIIA - creatine is stable (average is 1.6-1.7)  We have been working to coordinate a Cardiac CT to rule out ischemia.  He may be able to leave late today if testing is negative.  Throughout the day we have heard it has been potentially feasible and will attempt to coordinate this   - I do not see his catecholamine and metanephrine, cortisol this work up should be performed - no evidence of renal artery stenosis - He is compliant with CPAP  - pending HTN clinic work up for Renal Artery denveration; given CKD has not further progressed would recommend this   Morbid obesity - last dose 2-3 weeks ago  - hold [facility] inpatient    CKDIII  - will start SGLT2i as outpatient given contrast administration above  Has 11/26/22 follow up  with cardiology to review testing, follow BP, follow kidney  For questions or updates, please contact [facility] Please consult [email or web address] for contact info under Cardiology/STEMI.      Rudean Haskell, MD [facility] [address] [place], [place] [address] [phone]  8:01 AM

## 2022-11-21 NOTE — Discharge Summary (Signed)
Physician Discharge Summary  [name] [id number] DOB: 1981-05-13 DOA: 11/20/2022  PCP: [place], [name], MD  Admit date: 11/20/2022 Discharge date: 11/21/2022  Admitted From: Home  Discharge disposition: Home  Recommendations for Outpatient Follow-Up:   Follow up with your primary care provider in one week.  Check CBC, BMP, magnesium in the next visit Follow-up with cardiology, CT scan clinic for outpatient coronary CT scan.  Discharge Diagnosis:   Principal Problem:   Hypertensive urgency Active Problems:   Chronic kidney disease, stage 3a (HCC)   Chronic diastolic CHF (congestive heart failure) (HCC)   Gastroesophageal reflux disease without esophagitis   Nicotine dependence, cigarettes, uncomplicated   OSA (obstructive sleep apnea)   Obesity hypoventilation syndrome ([place])   Morbid obesity due to excess calories ([place])   Chest pain   Discharge Condition: Improved.  Diet recommendation: Low sodium, heart healthy.    Wound care: None.  Code status: Full.   History of Present Illness:   Calvin Bailey is a 42 y.o. male with medical history significant for GERD, hypertension, LVH, chronic diastolic heart failure, history of expressive aphasia, sleep apnea, obesity hypoventilation syndrome, PFO, stage IIIa CKD, tobacco use disorder in remission presented to hospital for chest pain on and off for the last 1 week.  Patient was seen in the ED recently and had preliminary workup which was negative and was discharged home but then continued to have pain with some radiation to the back and neck so he decided to come back to the hospital.  In the ED patient was noted to be hypertensive with blood pressure of 161/99.  Labs showed mild leukocytosis.  Troponins were negative.  Creatinine was 1.6 around baseline.  Lipase was 195.  CRP and ESR at 1.5 and 22 respectively.  Chest x-ray showed cardiomegaly.  Patient was then placed in observation for further evaluation and  treatment.    Hospital Course:   Following conditions were addressed during hospitalization as listed below,  Atypical chest pain likely secondary hypertensive urgency with first-degree heart block. Continue hydralazine spironolactone labetalol valsartan.  Labetalol dose was increased on discharge. Cardiology was consulted and patient has been started on Imdur as well.  Plan for CCTA as outpatient.  2D echocardiogram showed LV ejection fraction of 60 to 65% with LVH.  At this time patient has been considered okay by cardiology for disposition.  Patient will need to follow-up for coronary CT as outpatient.    Chronic kidney disease, stage 3a ([place]) Creatinine on presentation at 1.6 at baseline.  Creatinine prior to discharge was 1.7.    Chronic diastolic CHF (congestive heart failure) (HCC) Continue beta-blocker, spironolactone, ARB.  Has been started on nitrates.  Dose of labetalol was increased.     Gastroesophageal reflux disease without esophagitis, postprandial pain. Continue MiraLAX twice a day for 14 days followed by once daily on discharge.  Patient does complain of heartburn and gas.     Nicotine dependence, cigarettes, uncomplicated Declines a nicotine patch.     OSA (obstructive sleep apnea)   Obesity hypoventilation syndrome (HCC) Continue CPAP at bedtime.     Morbid obesity due to excess calories (HCC) Current BMI 49.63 kg/m.  Would benefit from weight loss as outpatient.     Disposition.  At this time, patient is stable for disposition home with outpatient primary care and cardiology follow-up.  Medical Consultants:   Cardiology  Procedures:    2D echocardiogram. Subjective:   Today, patient patient was seen and examined  at bedside.  Still complains of mild chest discomfort but no shortness of breath dizziness.  Complains of heartburn and gaseous feeling pain worse after eating.  Discharge Exam:   Vitals:   11/21/22 0509 11/21/22 1331  BP: (!) 143/88 133/82   Pulse: 61 64  Resp: 18 16  Temp: (!) 97.3 F (36.3 C) 98.2 F (36.8 C)  SpO2: 96% 99%   Vitals:   11/20/22 2213 11/21/22 0204 11/21/22 0509 11/21/22 1331  BP: 123/70 129/69 (!) 143/88 133/82  Pulse: 61 60 61 64  Resp: '17 17 18 16  '$ Temp: 97.7 F (36.5 C) 97.7 F (36.5 C) (!) 97.3 F (36.3 C) 98.2 F (36.8 C)  TempSrc: Oral Oral Oral Oral  SpO2: 99% 97% 96% 99%  Weight:      Height:       Body mass index is 49.63 kg/m.  General: Alert awake, not in obvious distress, morbidly obese HENT: pupils equally reacting to light,  No scleral pallor or icterus noted. Oral mucosa is moist.  Chest:  Clear breath sounds.  Diminished breath sounds bilaterally. No crackles or wheezes.  CVS: S1 &S2 heard. No murmur.  Regular rate and rhythm. Abdomen: Soft, nontender, nondistended.  Bowel sounds are heard.   Extremities: No cyanosis, clubbing or edema.  Peripheral pulses are palpable. Psych: Alert, awake and oriented, normal mood CNS:  No cranial nerve deficits.  Power equal in all extremities.   Skin: Warm and dry.  No rashes noted.  The results of significant diagnostics from this hospitalization (including imaging, microbiology, ancillary and laboratory) are listed below for reference.     Diagnostic Studies:   ECHOCARDIOGRAM COMPLETE  Result Date: 11/20/2022    ECHOCARDIOGRAM REPORT   Patient Name:   Calvin Bailey Date of Exam: 11/20/2022 Medical Rec #:  [id number]        Height:       72.0 in Accession #:    [id number]       Weight:       366.0 lb Date of Birth:  November 06, 1980        BSA:          2.754 m Patient Age:    48 years         BP:           132/75 mmHg Patient Gender: M                HR:           62 bpm. Exam Location:  Inpatient Procedure: 2D Echo and Intracardiac Opacification Agent Indications:    chest pain  History:        Patient has prior history of Echocardiogram examinations, most                 recent 02/27/2022. CHF; Risk Factors:Hypertension.  Sonographer:    Harvie Junior Referring Phys: [id number] [facility] A Gasper Sells  Sonographer Comments: Patient is obese. Image acquisition challenging due to patient body habitus. IMPRESSIONS  1. Left ventricular ejection fraction, by estimation, is 60 to 65%. The left ventricle has normal function. The left ventricle has no regional wall motion abnormalities. There is mild concentric left ventricular hypertrophy. Left ventricular diastolic parameters were normal.  2. Right ventricular systolic function is normal. The right ventricular size is normal. There is normal pulmonary artery systolic pressure.  3. The mitral valve is normal in structure. Trivial mitral valve regurgitation. No evidence of mitral stenosis.  4. The aortic valve  is tricuspid. Aortic valve regurgitation is mild. No aortic stenosis is present.  5. The inferior vena cava is normal in size with greater than 50% respiratory variability, suggesting right atrial pressure of 3 mmHg. Comparison(s): No significant change from prior study. FINDINGS  Left Ventricle: Left ventricular ejection fraction, by estimation, is 60 to 65%. The left ventricle has normal function. The left ventricle has no regional wall motion abnormalities. Definity contrast agent was given IV to delineate the left ventricular  endocardial borders. The left ventricular internal cavity size was normal in size. There is mild concentric left ventricular hypertrophy. Left ventricular diastolic parameters were normal. Right Ventricle: The right ventricular size is normal. No increase in right ventricular wall thickness. Right ventricular systolic function is normal. There is normal pulmonary artery systolic pressure. The tricuspid regurgitant velocity is 2.24 m/s, and  with an assumed right atrial pressure of 3 mmHg, the estimated right ventricular systolic pressure is [id number] mmHg. Left Atrium: Left atrial size was normal in size. Right Atrium: Right atrial size was normal in size. Pericardium: There is no evidence  of pericardial effusion. Mitral Valve: The mitral valve is normal in structure. Trivial mitral valve regurgitation. No evidence of mitral valve stenosis. Tricuspid Valve: The tricuspid valve is normal in structure. Tricuspid valve regurgitation is trivial. Aortic Valve: The aortic valve is tricuspid. Aortic valve regurgitation is mild. Aortic regurgitation PHT measures 572 msec. No aortic stenosis is present. Aortic valve mean gradient measures 6.0 mmHg. Aortic valve peak gradient measures 10.4 mmHg. Aortic valve area, by VTI measures 2.98 cm. Pulmonic Valve: The pulmonic valve was normal in structure. Pulmonic valve regurgitation is trivial. Aorta: The aortic root is normal in size and structure. Venous: The inferior vena cava is normal in size with greater than 50% respiratory variability, suggesting right atrial pressure of 3 mmHg. IAS/Shunts: The atrial septum is grossly normal.  LEFT VENTRICLE PLAX 2D LVIDd:         5.90 cm      Diastology LVIDs:         3.60 cm      LV e' medial:    7.94 cm/s LV PW:         1.50 cm      LV E/e' medial:  11.3 LV IVS:        1.50 cm      LV e' lateral:   11.00 cm/s LVOT diam:     2.10 cm      LV E/e' lateral: 8.2 LV SV:         103 LV SV Index:   38 LVOT Area:     3.46 cm  LV Volumes (MOD) LV vol d, MOD A2C: 133.0 ml LV vol d, MOD A4C: 215.0 ml LV vol s, MOD A2C: 58.2 ml LV vol s, MOD A4C: 84.5 ml LV SV MOD A2C:     74.8 ml LV SV MOD A4C:     215.0 ml LV SV MOD BP:      107.0 ml RIGHT VENTRICLE RV Basal diam:  3.80 cm RV Mid diam:    3.10 cm RV S prime:     15.70 cm/s TAPSE (M-mode): 2.0 cm LEFT ATRIUM             Index        RIGHT ATRIUM           Index LA diam:        3.90 cm 1.42 cm/m   RA Area:  17.30 cm LA Vol (A2C):   81.5 ml 29.59 ml/m  RA Volume:   43.70 ml  15.87 ml/m LA Vol (A4C):   79.9 ml 29.01 ml/m LA Biplane Vol: 82.4 ml 29.92 ml/m  AORTIC VALVE                     PULMONIC VALVE AV Area (Vmax):    2.98 cm      PV Vmax:       1.21 m/s AV Area (Vmean):    2.91 cm      PV Peak grad:  5.9 mmHg AV Area (VTI):     2.98 cm AV Vmax:           161.00 cm/s AV Vmean:          108.000 cm/s AV VTI:            0.347 m AV Peak Grad:      10.4 mmHg AV Mean Grad:      6.0 mmHg LVOT Vmax:         138.50 cm/s LVOT Vmean:        90.700 cm/s LVOT VTI:          0.298 m LVOT/AV VTI ratio: 0.86 AI PHT:            572 msec  AORTA Ao Root diam: 3.60 cm Ao Asc diam:  3.30 cm MITRAL VALVE               TRICUSPID VALVE MV Area (PHT): 3.53 cm    TR Peak grad:   20.1 mmHg MV Decel Time: 215 msec    TR Vmax:        224.00 cm/s MV E velocity: 89.70 cm/s MV A velocity: 78.70 cm/s  SHUNTS MV E/A ratio:  1.14        Systemic VTI:  0.30 m                            Systemic Diam: 2.10 cm Gwyndolyn Kaufman MD Electronically signed by Gwyndolyn Kaufman MD Signature Date/Time: 11/20/2022/5:09:49 PM    Final    DG Chest Port 1 View  Result Date: 11/20/2022 CLINICAL DATA:  [id number] with chest pain EXAM: PORTABLE CHEST 1 VIEW COMPARISON:  CTA chest 11/13/2022. FINDINGS: There is mild cardiomegaly without overt CHF findings. The mediastinum is stable. Both lungs are clear. The visualized skeletal structures are unremarkable. IMPRESSION: Mild cardiomegaly without overt CHF findings.  Both lungs are clear. Electronically Signed   By: Telford Nab M.D.   On: 11/20/2022 06:27     Labs:   Basic Metabolic Panel: Recent Labs  Lab 11/20/22 0536 11/21/22 0435  NA 136 134*  K 4.3 4.1  CL 104 103  CO2 23 24  GLUCOSE 93 93  BUN 29* 32*  CREATININE 1.61* 1.73*  CALCIUM 8.9 9.0   GFR Estimated Creatinine Clearance: 88 mL/min (A) (by C-G formula based on SCr of 1.73 mg/dL (H)). Liver Function Tests: Recent Labs  Lab 11/20/22 0536  AST 16  ALT 18  ALKPHOS 68  BILITOT 0.5  PROT 7.8  ALBUMIN 4.1   Recent Labs  Lab 11/20/22 0536  LIPASE 195*   No results for input(s): "AMMONIA" in the last 168 hours. Coagulation profile No results for input(s): "INR", "PROTIME" in the last 168  hours.  CBC: Recent Labs  Lab 11/20/22 0536 11/21/22 0435  WBC 11.3* 10.8*  HGB 14.7  13.8  HCT 44.7 41.8  MCV 87.3 88.4  PLT 313 284   Cardiac Enzymes: No results for input(s): "CKTOTAL", "CKMB", "CKMBINDEX", "TROPONINI" in the last 168 hours. BNP: Invalid input(s): "POCBNP" CBG: No results for input(s): "GLUCAP" in the last 168 hours. D-Dimer No results for input(s): "DDIMER" in the last 72 hours. Hgb A1c No results for input(s): "HGBA1C" in the last 72 hours. Lipid Profile No results for input(s): "CHOL", "HDL", "LDLCALC", "TRIG", "CHOLHDL", "LDLDIRECT" in the last 72 hours. Thyroid function studies No results for input(s): "TSH", "T4TOTAL", "T3FREE", "THYROIDAB" in the last 72 hours.  Invalid input(s): "FREET3" Anemia work up No results for input(s): "VITAMINB12", "FOLATE", "FERRITIN", "TIBC", "IRON", "RETICCTPCT" in the last 72 hours. Microbiology No results found for this or any previous visit (from the past 240 hour(s)).   Discharge Instructions:   Discharge Instructions     Diet - low sodium heart healthy   Complete by: As directed    Discharge instructions   Complete by: As directed    Follow-up with your primary care provider in 1 week.   Please note frequency of of labetalol has been increased.Seek medical attention for worsening symptoms.  Please get CT scan as scheduled by the clinic.  Take medications as prescribed.  Check blood pressure daily at home.  Take acid reflux precautions( raise the head of the bed, no food 3 hours prior to bedtime, avoid fatty fried spicy food that can cause heartburn and acidity).   Increase activity slowly   Complete by: As directed       Allergies as of 11/21/2022       Reactions   Chlorthalidone Nausea Only   Lisinopril Cough        Medication List     STOP taking these medications    ibuprofen 200 MG tablet Commonly known as: ADVIL       TAKE these medications    acetaminophen 500 MG tablet Commonly  known as: TYLENOL Take 1 tablet (500 mg total) by mouth every 6 (six) hours as needed. What changed:  how much to take reasons to take this   amLODipine 10 MG tablet Commonly known as: NORVASC Take 1 tablet (10 mg total) by mouth daily.   hydrALAZINE 100 MG tablet Commonly known as: APRESOLINE Take 1 tablet (100 mg total) by mouth 3 (three) times daily. What changed: when to take this   isosorbide mononitrate 30 MG 24 hr tablet Commonly known as: IMDUR Take 1 tablet (30 mg total) by mouth daily. Start taking on: November 22, 2022   ketorolac 10 MG tablet Commonly known as: TORADOL Take 10 mg by mouth 3 (three) times daily.   labetalol 300 MG tablet Commonly known as: NORMODYNE Take 1 tablet (300 mg total) by mouth 3 (three) times daily. What changed: when to take this   pantoprazole 40 MG tablet Commonly known as: PROTONIX Take 1 tablet (40 mg total) by mouth 2 (two) times daily before a meal for 14 days, THEN 1 tablet (40 mg total) daily. Start taking on: November 21, 2022   spironolactone 25 MG tablet Commonly known as: ALDACTONE TAKE 1 TABLET(25 MG) BY MOUTH DAILY What changed: See the new instructions.   traMADol 50 MG tablet Commonly known as: ULTRAM 1 to 2 tablets every 6 hours with extra strength Tylenol for pain as needed What changed:  how much to take how to take this when to take this reasons to take this additional instructions   valsartan 160 MG  tablet Commonly known as: Diovan Take 1 tablet (160 mg total) by mouth 2 (two) times daily.   Wegovy 1 MG/0.5ML Soaj Generic drug: Semaglutide-Weight Management Inject 1 mg into the skin every 7 (seven) days. What changed: when to take this        Follow-up Information     [place], Blondell Reveal, MD Follow up in 1 week(s).   Specialty: Family Medicine Contact information: [address] [place] [address] [phone]                  Time coordinating discharge: 39  minutes  Signed:  Shaneen Reeser  [facility] 11/21/2022, 2:01 PM

## 2022-11-21 NOTE — Progress Notes (Signed)
  Transition of Care [facility]) Screening Note   Patient Details  Name: CARLON WININGS Date of Birth: Jul 18, 1981   Transition of Care [facility]) CM/SW Contact:    Henrietta Dine, RN Phone Number: 11/21/2022, 3:01 PM    Transition of Care Department [facility]) has reviewed patient and no TOC needs have been identified at this time. We will continue to monitor patient advancement through interdisciplinary progression rounds. If new patient transition needs arise, please place a TOC consult.

## 2022-11-22 ENCOUNTER — Telehealth (HOSPITAL_BASED_OUTPATIENT_CLINIC_OR_DEPARTMENT_OTHER): Payer: Self-pay

## 2022-11-22 NOTE — Transitions of Care (Post Inpatient/ED Visit) (Signed)
   11/22/2022  Name: BLANCHE LUCZAK MRN: [id number] DOB: 18-Jul-1981  Today's TOC FU Call Status: Today's TOC FU Call Status:: Successful TOC FU Call Competed TOC FU Call Complete Date: 11/22/22  Transition Care Management Follow-up Telephone Call Date of Discharge: 11/21/22 Discharge Facility: [facility]) Type of Discharge: Inpatient Admission Primary Inpatient Discharge Diagnosis:: hypertensive urgency How have you been since you were released from the hospital?: Same Any questions or concerns?: No  Items Reviewed: Did you receive and understand the discharge instructions provided?: Yes Medications obtained and verified?: Yes (Medications Reviewed) Any new allergies since your discharge?: No Dietary orders reviewed?: Yes Do you have support at home?: Yes  Home Care and Equipment/Supplies: [place] Ordered?: No Any new equipment or medical supplies ordered?: No  Functional Questionnaire: Do you need assistance with bathing/showering or dressing?: No Do you need assistance with meal preparation?: No Do you need assistance with eating?: No Do you have difficulty maintaining continence: No Do you need assistance with getting out of bed/getting out of a chair/moving?: No Do you have difficulty managing or taking your medications?: No  Folllow up appointments reviewed: PCP Follow-up appointment confirmed?: Yes Date of PCP follow-up appointment?: 11/23/22 Follow-up Provider: Dr Tennis [facility] Follow-up appointment confirmed?: No Do you need transportation to your follow-up appointment?: No Do you understand care options if your condition(s) worsen?: Yes-patient verbalized understanding    [place] LPN [place] Direct Dial [phone]

## 2022-11-23 ENCOUNTER — Encounter (HOSPITAL_BASED_OUTPATIENT_CLINIC_OR_DEPARTMENT_OTHER): Payer: Self-pay | Admitting: Family Medicine

## 2022-11-23 ENCOUNTER — Telehealth (HOSPITAL_BASED_OUTPATIENT_CLINIC_OR_DEPARTMENT_OTHER): Payer: Self-pay | Admitting: Family Medicine

## 2022-11-23 ENCOUNTER — Ambulatory Visit (HOSPITAL_BASED_OUTPATIENT_CLINIC_OR_DEPARTMENT_OTHER): Payer: 59 | Admitting: Family Medicine

## 2022-11-23 VITALS — BP 140/77 | HR 79 | Ht 72.0 in | Wt 363.5 lb

## 2022-11-23 DIAGNOSIS — I10 Essential (primary) hypertension: Secondary | ICD-10-CM

## 2022-11-23 DIAGNOSIS — K219 Gastro-esophageal reflux disease without esophagitis: Secondary | ICD-10-CM

## 2022-11-23 DIAGNOSIS — Z09 Encounter for follow-up examination after completed treatment for conditions other than malignant neoplasm: Secondary | ICD-10-CM | POA: Diagnosis not present

## 2022-11-23 NOTE — Telephone Encounter (Signed)
Pt was seen in office by FNP Pecolia Ades while provider is on leave. Pt dropped off FMLA forms that were discussed in this visit. Paperwork is left in providers box for CMA to give to provider.

## 2022-11-23 NOTE — Assessment & Plan Note (Addendum)
Presents for follow-up for hospitalization for hypertensive urgency.  Blood pressure was 161/99.  Patient is taking amlodipine, hydralazine, isosorbide, labetalol, spironolactone, and valsartan as prescribed.  He endorses chronic chest pain, chronic lower extremity swelling, chronic shortness of breath, blurry vision at times and headaches.  His blood pressure is 140/77 in the office today.  He reports that his pain moves around from his chest to his neck to his abdominal area.  Per discharge summary, we will get BMP, CBC and magnesium level today.  He will continue his current antihypertensive regimen, he has a follow-up appointment with cardiology scheduled on November 26, 2022.  He will be getting a CT coronary per cardiology.  Discussed NSAID therapy, and how this can raise blood pressure, he is on Toradol currently for his pain, which will relieve his pain for approximately 6 hours and return.  Advised patient to discuss NSAID therapy with cardiology on Friday.  He will follow-up with PCP in 1 month.

## 2022-11-23 NOTE — Progress Notes (Signed)
Established Patient Office Visit  Subjective   Patient ID: Calvin Bailey, male    DOB: Nov 16, 1980  Age: 42 y.o. MRN: [id number]  Chief Complaint  Patient presents with   Follow-up    Pt here for hospital f/u on HTN     HPI Hospital follow-up:  chart review:  Admitted to [facility]  on 11/20/22- 11/21/22. He was there for 33 hours for hypertensive urgency. His blood pressure was 161/99. Was having chest pain, back pain, neck pain. Negative troponin at that time.  He was discharged with new medication Imdur 30 mg QD and increased frequency labetalol 300 mg TID. He continues to take: amlodipine 10 mg QD, hydralazine 100 mg TID, spironolactone 25 mg QD, and valsartan 160 mg BID.  Chest  pain relieved by [facility] for 6 hours and then it returns. Pain moves to different location every day. Discussed NSAIDs and how they can also raise blood pressure, he will discuss with cardiology on Friday at this appointment. Reports this is the only thing that will relieve his pain. Has tried tramadol without relief.   Hypertension:  Medication compliance: taking as prescribed: amlodipine 10 mg QD, hydralazine 100 mg TID, isosorbide mononitrate 30 mg QD, labetalol 300 mg TID, spironolactone 25 mg QD, valsartan 160 mg BID.  Endorses chest pain and chronic lower extremity swelling, shortness of breath, blurry vision at times, and headaches.  Pertinent lab work: needs BMP, CBC, MG per D/C summary  Monitoring at home: has a monitor at home, has not been monitoring. 140/80's is baseline. Encouraged patient to monitor blood pressure at home.  Tolerating medication well: no side effects reported. Wonders if his medications is making him feel the way he is feeling. He reports he was on Wegovy and had similar symptoms in past and stopped taking, resumed taking and symptoms returned. Last dose 3 weeks ago.  Continue current medication regimen: continue per cardiolgy  Follow-up: one month with PCP. Has cardiology appointment  on 11/26/22 Needs CT Coronary per cardiology. Blood pressure improved upon recheck today. 140/77.    Review of Systems  Respiratory:  Negative for shortness of breath.   Cardiovascular:  Positive for chest pain (constant, had negative troponins, takes ketolac for pain and helps).      Objective:     BP (!) 140/77   Pulse 79   Ht 6' (1.829 m)   Wt (!) 363 lb 8 oz (164.9 kg)   SpO2 99%   BMI 49.30 kg/m  BP Readings from Last 3 Encounters:  11/23/22 (!) 140/77  11/21/22 133/82  11/13/22 (!) 170/96      Physical Exam Vitals and nursing note reviewed.  Constitutional:      General: He is not in acute distress.    Appearance: Normal appearance. He is obese.  Cardiovascular:     Rate and Rhythm: Regular rhythm.     Heart sounds: Normal heart sounds.  Pulmonary:     Effort: Pulmonary effort is normal.     Breath sounds: Normal breath sounds.  Musculoskeletal:     Right lower leg: Edema present.     Left lower leg: Edema present.  Skin:    General: Skin is warm and dry.  Neurological:     General: No focal deficit present.     Mental Status: He is alert. Mental status is at baseline.  Psychiatric:        Mood and Affect: Mood normal.        Behavior: Behavior normal.  Thought Content: Thought content normal.        Judgment: Judgment normal.     No results found for any visits on 11/23/22.    The 10-year ASCVD risk score (Arnett DK, et al., 2019) is: 12.9%    Assessment & Plan:   Problem List Items Addressed This Visit     Essential hypertension (Chronic)    Presents for follow-up for hospitalization for hypertensive urgency.  Blood pressure was 161/99.  Patient is taking amlodipine, hydralazine, isosorbide, labetalol, spironolactone, and valsartan as prescribed.  He endorses chronic chest pain, chronic lower extremity swelling, chronic shortness of breath, blurry vision at times and headaches.  His blood pressure is 140/77 in the office today.  He  reports that his pain moves around from his chest to his neck to his abdominal area.  Per discharge summary, we will get BMP, CBC and magnesium level today.  He will continue his current antihypertensive regimen, he has a follow-up appointment with cardiology scheduled on November 26, 2022.  He will be getting a CT coronary per cardiology.  Discussed NSAID therapy, and how this can raise blood pressure, he is on Toradol currently for his pain, which will relieve his pain for approximately 6 hours and return.  Advised patient to discuss NSAID therapy with cardiology on Friday.  He will follow-up with PCP in 1 month.      Relevant Orders   Basic Metabolic Panel (BMET)   CBC with Differential   Magnesium   GERD (gastroesophageal reflux disease)    Patient on pantoprazole for acid reflux, requested referral for gastroenterology for further evaluation and treatment.      Relevant Medications   omeprazole (PRILOSEC) 40 MG capsule   Other Relevant Orders   Ambulatory referral to Gastroenterology   Hospital discharge follow-up - Primary   Relevant Orders   Basic Metabolic Panel (BMET)   CBC with Differential   Magnesium  Agrees with plan of care discussed.  Questions answered. No refills needed today.  Will drop off FMLA papers later this week.  Reports he was on wegovy in the past, had similar symptoms, went off, restarted and now having symptoms again. Last dose about 3 weeks ago, wonders if this is related.   Return in about 1 month (around 12/22/2022) for HTN .    Chalmers Guest, FNP

## 2022-11-23 NOTE — Assessment & Plan Note (Signed)
Patient on pantoprazole for acid reflux, requested referral for gastroenterology for further evaluation and treatment.

## 2022-11-24 ENCOUNTER — Ambulatory Visit (HOSPITAL_COMMUNITY): Payer: 59

## 2022-11-24 LAB — MAGNESIUM: Magnesium: 2.7 mg/dL — ABNORMAL HIGH (ref 1.6–2.3)

## 2022-11-24 LAB — CBC WITH DIFFERENTIAL/PLATELET
Basophils Absolute: 0.1 10*3/uL (ref 0.0–0.2)
Basos: 0 %
EOS (ABSOLUTE): 0.4 10*3/uL (ref 0.0–0.4)
Eos: 4 %
Hematocrit: 41 % (ref 37.5–51.0)
Hemoglobin: 13.5 g/dL (ref 13.0–17.7)
Immature Grans (Abs): 0 10*3/uL (ref 0.0–0.1)
Immature Granulocytes: 0 %
Lymphocytes Absolute: 2.1 10*3/uL (ref 0.7–3.1)
Lymphs: 19 %
MCH: 28.8 pg (ref 26.6–33.0)
MCHC: 32.9 g/dL (ref 31.5–35.7)
MCV: 88 fL (ref 79–97)
Monocytes Absolute: 0.7 10*3/uL (ref 0.1–0.9)
Monocytes: 7 %
Neutrophils Absolute: 7.8 10*3/uL — ABNORMAL HIGH (ref 1.4–7.0)
Neutrophils: 70 %
Platelets: 299 10*3/uL (ref 150–450)
RBC: 4.68 x10E6/uL (ref 4.14–5.80)
RDW: 12.9 % (ref 11.6–15.4)
WBC: 11.1 10*3/uL — ABNORMAL HIGH (ref 3.4–10.8)

## 2022-11-24 LAB — BASIC METABOLIC PANEL
BUN/Creatinine Ratio: 13 (ref 9–20)
BUN: 25 mg/dL — ABNORMAL HIGH (ref 6–24)
CO2: 20 mmol/L (ref 20–29)
Calcium: 8.8 mg/dL (ref 8.7–10.2)
Chloride: 106 mmol/L (ref 96–106)
Creatinine, Ser: 1.96 mg/dL — ABNORMAL HIGH (ref 0.76–1.27)
Glucose: 146 mg/dL — ABNORMAL HIGH (ref 70–99)
Potassium: 4.4 mmol/L (ref 3.5–5.2)
Sodium: 140 mmol/L (ref 134–144)
eGFR: 43 mL/min/{1.73_m2} — ABNORMAL LOW (ref >59)

## 2022-11-24 NOTE — Progress Notes (Deleted)
Cardiology Clinic Note   Patient Name: Calvin Bailey Date of Encounter: 11/24/2022  Primary Care Provider:  [place], Blondell Reveal, MD Primary Cardiologist:  Berniece Salines, DO  Patient Profile    42 year old male with hx of HTN, HLD, OSA and complicated CKD, morbid obesity on SGLT I Wegovy. Admitted 11/20/2022 for chest pain and hypertensive emergency.   Past Medical History    Past Medical History:  Diagnosis Date   Dizziness 09/25/2019   GERD (gastroesophageal reflux disease)    HTN (hypertension)    Obesity hypoventilation syndrome (HCC) 04/08/2014   OSA (obstructive sleep apnea) 04/08/2014   PFO (patent foramen ovale)    Sleep apnea    Past Surgical History:  Procedure Laterality Date   two knee surgeries Left 1995    Allergies  Allergies  Allergen Reactions   Chlorthalidone Nausea Only   Lisinopril Cough    History of Present Illness    Mr. Kunkle presents today for ongoing assessment and management of hypertension s/p hospitalization on 11/21/22 for hypertensive emergency, with associated chest pain. He was ruled out for ACS. Seen by Dr. Gasper Sells and found to be on multiple medications for BP control.  Imdur 30 mg , but with option to add minoxidil if Imdur is not tolerated. Labetalol dose was increased to 300 mg  No evidence of RAS was found.   Home Medications    Current Outpatient Medications  Medication Sig Dispense Refill   acetaminophen (TYLENOL) 500 MG tablet Take 1 tablet (500 mg total) by mouth every 6 (six) hours as needed. (Patient taking differently: Take 1,000 mg by mouth every 6 (six) hours as needed for mild pain.) 30 tablet 0   amLODipine (NORVASC) 10 MG tablet Take 1 tablet (10 mg total) by mouth daily. 90 tablet 3   cyclobenzaprine (FLEXERIL) 10 MG tablet Take 10 mg by mouth at bedtime.     gabapentin (NEURONTIN) 100 MG capsule Take 100 mg by mouth at bedtime.     hydrALAZINE (APRESOLINE) 100 MG tablet Take 1 tablet (100 mg total) by mouth 3  (three) times daily. (Patient taking differently: Take 100 mg by mouth 2 (two) times daily.) 180 tablet 3   isosorbide mononitrate (IMDUR) 30 MG 24 hr tablet Take 1 tablet (30 mg total) by mouth daily. 30 tablet 2   ketorolac (TORADOL) 10 MG tablet Take 10 mg by mouth 3 (three) times daily.     labetalol (NORMODYNE) 300 MG tablet Take 1 tablet (300 mg total) by mouth 3 (three) times daily. 180 tablet 3   omeprazole (PRILOSEC) 40 MG capsule Take 1 capsule by mouth daily.     pantoprazole (PROTONIX) 40 MG tablet Take 1 tablet (40 mg total) by mouth 2 (two) times daily before a meal for 14 days, THEN 1 tablet (40 mg total) daily. 58 tablet 0   Semaglutide-Weight Management (WEGOVY) 1 MG/0.5ML SOAJ Inject 1 mg into the skin every 7 (seven) days. 2 mL 1   spironolactone (ALDACTONE) 25 MG tablet TAKE 1 TABLET(25 MG) BY MOUTH DAILY (Patient taking differently: Take 25 mg by mouth daily.) 90 tablet 1   topiramate (TOPAMAX) 50 MG tablet Take 50 mg by mouth 2 (two) times daily.     traMADol (ULTRAM) 50 MG tablet 1 to 2 tablets every 6 hours with extra strength Tylenol for pain as needed (Patient taking differently: Take 50-100 mg by mouth every 6 (six) hours as needed for moderate pain. Can take with extra strength Tylenol for  pain as needed) 15 tablet 0   valsartan (DIOVAN) 160 MG tablet Take 1 tablet (160 mg total) by mouth 2 (two) times daily. 180 tablet 3   No current facility-administered medications for this visit.     Family History    Family History  Problem Relation Age of Onset   Hypertension Mother    Multiple sclerosis Mother    Hypertension Father    Hypertension Brother    Hyperlipidemia Paternal Grandfather    Heart failure Paternal Grandfather    Diabetes Maternal Grandmother    Heart failure Maternal Grandfather    CVA Maternal Uncle    He indicated that his mother is alive. He indicated that his father is alive. He indicated that the status of his brother is unknown. He indicated  that the status of his maternal grandmother is unknown. He indicated that the status of his maternal grandfather is unknown. He indicated that the status of his paternal grandfather is unknown. He indicated that the status of his maternal uncle is unknown.  Social History    Social History   Socioeconomic History   Marital status: Single    Spouse name: Not on file   Number of children: 2   Years of education: college   Highest education level: Some college, no degree  Occupational History   Occupation: unemployment    Fish farm manager: UNEMPLOYED    Employer: [employer]  Tobacco Use   Smoking status: Former    Packs/day: 0.10    Years: 16.00    Total pack years: 1.60    Types: Cigarettes    Quit date: 06/08/2022    Years since quitting: 0.4   Smokeless tobacco: Never  Vaping Use   Vaping Use: Never used  Substance and Sexual Activity   Alcohol use: Not Currently    Comment: sparingly    Drug use: No   Sexual activity: Yes    Partners: Female  Other Topics Concern   Not on file  Social History Narrative   Nature conservation officer   Patient is single and lives alone.   Patient is left-handed.   Patient does not drink any caffeine.   Social Determinants of Health   Financial Resource Strain: Low Risk  (07/01/2022)   Overall Financial Resource Strain (CARDIA)    Difficulty of Paying Living Expenses: Not hard at all  Food Insecurity: No Food Insecurity (11/20/2022)   Hunger Vital Sign    Worried About Running Out of Food in the Last Year: Never true    Ran Out of Food in the Last Year: Never true  Transportation Needs: No Transportation Needs (11/20/2022)   PRAPARE - Hydrologist (Medical): No    Lack of Transportation (Non-Medical): No  Physical Activity: Insufficiently Active (07/01/2022)   Exercise Vital Sign    Days of Exercise per Week: 3 days    Minutes of Exercise per Session: 30 min  Stress: No Stress Concern Present (07/01/2022)   [place]    Feeling of Stress : Only a little  Social Connections: Socially Isolated (07/01/2022)   Social Connection and Isolation Panel [NHANES]    Frequency of Communication with Friends and Family: Once a week    Frequency of Social Gatherings with Friends and Family: Once a week    Attends Religious Services: 1 to 4 times per year    Active Member of [employer] or Organizations: No    Attends Club or  Organization Meetings: Never    Marital Status: Never married  Intimate Partner Violence: Not At Risk (11/20/2022)   Humiliation, Afraid, Rape, and Kick questionnaire    Fear of Current or Ex-Partner: No    Emotionally Abused: No    Physically Abused: No    Sexually Abused: No     Review of Systems    General:  No chills, fever, night sweats or weight changes.  Cardiovascular:  No chest pain, dyspnea on exertion, edema, orthopnea, palpitations, paroxysmal nocturnal dyspnea. Dermatological: No rash, lesions/masses Respiratory: No cough, dyspnea Urologic: No hematuria, dysuria Abdominal:   No nausea, vomiting, diarrhea, bright red blood per rectum, melena, or hematemesis Neurologic:  No visual changes, wkns, changes in mental status. All other systems reviewed and are otherwise negative except as noted above.     Physical Exam    VS:  There were no vitals taken for this visit. , BMI There is no height or weight on file to calculate BMI.     GEN: Well nourished, well developed, in no acute distress. HEENT: normal. Neck: Supple, no JVD, carotid bruits, or masses. Cardiac: RRR, no murmurs, rubs, or gallops. No clubbing, cyanosis, edema.  Radials/DP/PT 2+ and equal bilaterally.  Respiratory:  Respirations regular and unlabored, clear to auscultation bilaterally. GI: Soft, nontender, nondistended, BS + x 4. MS: no deformity or atrophy. Skin: warm and dry, no rash. Neuro:  Strength and sensation are intact. Psych: Normal  affect.  Accessory Clinical Findings    ECG personally reviewed by me today- *** - No acute changes  Lab Results  Component Value Date   WBC 11.1 (H) 11/23/2022   HGB 13.5 11/23/2022   HCT 41.0 11/23/2022   MCV 88 11/23/2022   PLT 299 11/23/2022   Lab Results  Component Value Date   CREATININE 1.96 (H) 11/23/2022   BUN 25 (H) 11/23/2022   NA 140 11/23/2022   K 4.4 11/23/2022   CL 106 11/23/2022   CO2 20 11/23/2022   Lab Results  Component Value Date   ALT 18 11/20/2022   AST 16 11/20/2022   ALKPHOS 68 11/20/2022   BILITOT 0.5 11/20/2022   Lab Results  Component Value Date   CHOL 136 02/27/2022   HDL 28 (L) 02/27/2022   LDLCALC 93 02/27/2022   TRIG 74 02/27/2022   CHOLHDL 4.9 02/27/2022    Lab Results  Component Value Date   HGBA1C 5.4 02/27/2022    Review of Prior Studies: Echocardiogram 11/20/2022  1. Left ventricular ejection fraction, by estimation, is 60 to 65%. The  left ventricle has normal function. The left ventricle has no regional  wall motion abnormalities. There is mild concentric left ventricular  hypertrophy. Left ventricular diastolic  parameters were normal.   2. Right ventricular systolic function is normal. The right ventricular  size is normal. There is normal pulmonary artery systolic pressure.   3. The mitral valve is normal in structure. Trivial mitral valve  regurgitation. No evidence of mitral stenosis.   4. The aortic valve is tricuspid. Aortic valve regurgitation is mild. No  aortic stenosis is present.   5. The inferior vena cava is normal in size with greater than 50%  respiratory variability, suggesting right atrial pressure of 3 mmHg.    Assessment & Plan   1.  ***    Current medicines are reviewed at length with the patient today.  I have spent *** min's  dedicated to the care of this patient on the date of this  encounter to include pre-visit review of records, assessment, management and diagnostic testing,with shared  decision making. Signed, Phill Myron. West Pugh, ANP, [place]   11/24/2022 1:02 PM      Office [phone] Fax [phone]  Notice: This dictation was prepared with Dragon dictation along with smaller phrase technology. Any transcriptional errors that result from this process are unintentional and may not be corrected upon review.

## 2022-11-26 ENCOUNTER — Ambulatory Visit: Payer: 59 | Attending: Adult Health | Admitting: Adult Health

## 2022-12-01 ENCOUNTER — Encounter (HOSPITAL_BASED_OUTPATIENT_CLINIC_OR_DEPARTMENT_OTHER): Payer: Self-pay | Admitting: Family Medicine

## 2022-12-06 ENCOUNTER — Other Ambulatory Visit: Payer: Self-pay | Admitting: Physician Assistant

## 2022-12-06 DIAGNOSIS — N184 Chronic kidney disease, stage 4 (severe): Secondary | ICD-10-CM

## 2022-12-07 ENCOUNTER — Other Ambulatory Visit: Payer: Self-pay

## 2022-12-07 ENCOUNTER — Ambulatory Visit (HOSPITAL_COMMUNITY)
Admission: RE | Admit: 2022-12-07 | Discharge: 2022-12-07 | Disposition: A | Discharge status: Home / Self Care | Payer: 59 | Source: Ambulatory Visit | Attending: Diagnostic Radiology | Admitting: Diagnostic Radiology

## 2022-12-07 ENCOUNTER — Encounter (HOSPITAL_COMMUNITY): Payer: Self-pay

## 2022-12-07 DIAGNOSIS — N2889 Other specified disorders of kidney and ureter: Secondary | ICD-10-CM

## 2022-12-07 DIAGNOSIS — N289 Disorder of kidney and ureter, unspecified: Secondary | ICD-10-CM | POA: Diagnosis not present

## 2022-12-07 DIAGNOSIS — C649 Malignant neoplasm of unspecified kidney, except renal pelvis: Secondary | ICD-10-CM | POA: Diagnosis not present

## 2022-12-07 DIAGNOSIS — R600 Localized edema: Secondary | ICD-10-CM | POA: Diagnosis not present

## 2022-12-07 DIAGNOSIS — E669 Obesity, unspecified: Secondary | ICD-10-CM | POA: Diagnosis not present

## 2022-12-07 DIAGNOSIS — N184 Chronic kidney disease, stage 4 (severe): Secondary | ICD-10-CM

## 2022-12-07 DIAGNOSIS — I10 Essential (primary) hypertension: Secondary | ICD-10-CM | POA: Diagnosis not present

## 2022-12-07 LAB — CBC
HCT: 39.7 % (ref 39.0–52.0)
Hemoglobin: 13.3 g/dL (ref 13.0–17.0)
MCH: 29.1 pg (ref 26.0–34.0)
MCHC: 33.5 g/dL (ref 30.0–36.0)
MCV: 86.9 fL (ref 80.0–100.0)
Platelets: 281 10*3/uL (ref 150–400)
RBC: 4.57 MIL/uL (ref 4.22–5.81)
RDW: 12.5 % (ref 11.5–15.5)
WBC: 8.4 10*3/uL (ref 4.0–10.5)
nRBC: 0 % (ref 0.0–0.2)

## 2022-12-07 LAB — PROTIME-INR
INR: 1.1 (ref 0.8–1.2)
Prothrombin Time: 14 seconds (ref 11.4–15.2)

## 2022-12-07 MED ORDER — FENTANYL CITRATE (PF) 100 MCG/2ML IJ SOLN
INTRAMUSCULAR | Status: AC
  Filled 2022-12-07: qty 2

## 2022-12-07 MED ORDER — MIDAZOLAM HCL 2 MG/2ML IJ SOLN
INTRAMUSCULAR | Status: AC | PRN
  Administered 2022-12-07: .5 mg via INTRAVENOUS
  Administered 2022-12-07: 1 mg via INTRAVENOUS
  Administered 2022-12-07 (×3): .5 mg via INTRAVENOUS

## 2022-12-07 MED ORDER — HYDROCODONE-ACETAMINOPHEN 5-325 MG PO TABS: 1.0000 | ORAL_TABLET | ORAL | Status: DC | PRN

## 2022-12-07 MED ORDER — MIDAZOLAM HCL 2 MG/2ML IJ SOLN
INTRAMUSCULAR | Status: AC
  Filled 2022-12-07: qty 2

## 2022-12-07 MED ORDER — LIDOCAINE HCL (PF) 1 % IJ SOLN
25.0000 mL | Freq: Once | INTRAMUSCULAR | Status: AC
  Administered 2022-12-07: 25 mL via INTRADERMAL

## 2022-12-07 MED ORDER — FENTANYL CITRATE (PF) 100 MCG/2ML IJ SOLN
INTRAMUSCULAR | Status: AC | PRN
  Administered 2022-12-07: 25 ug via INTRAVENOUS
  Administered 2022-12-07: 50 ug via INTRAVENOUS
  Administered 2022-12-07: 25 ug via INTRAVENOUS
  Administered 2022-12-07: 50 ug via INTRAVENOUS
  Administered 2022-12-07: 25 ug via INTRAVENOUS

## 2022-12-07 NOTE — H&P (Signed)
Chief Complaint: Patient was seen in consultation today for Right interpolar renal lesion biopsy at the request of Drr C Winter   Supervising Physician: Corrie Mckusick  Patient Status: [facility] - Out-pt  History of Present Illness: Calvin Bailey is a 42 y.o. male   HTN; obesity Renal insufficiency Bilateral lower extremity edema Suspicious right renal lesions Was sent to IR for evaluation and ablation consideration Consult with Dr Anselm Pancoast 11/09/22  I am concerned the patient may have multiple small bilateral renal neoplasms and feel that we need to get a biopsy for more information.The right kidney interpolar lesion is hyperechoic on the previous ultrasound which could represent a renal cell carcinoma but could also be associated with a small angiomyolipoma.  Reviewed the imaging with the patient and explained my rationale for getting a biopsy prior to any treatment.  If the right kidney interpolar lesion is compatible with a renal cell carcinoma, this lesion should be amendable for percutaneous cryoablation.  Again, I am less concerned about the larger lesion in the right kidney upper pole due to the blood products but it is possible there could be an underlying neoplasm and will require close follow-up.  Percutaneous ablation of this right upper pole lesion would be technically difficult due to the location, proximity to surrounding structures and patient's obesity.  Explained that the risk of tumor seeding with the renal biopsy is very low and the largest risk of the procedure is bleeding due to his hypertension.  Plan for ultrasound-guided biopsy of the right kidney interpolar lesion.  Based on the results of the biopsy, we will discuss the need for renal ablation and/or follow-up.  Scheduled now for Rt renal interpolar lesion biopsy  Pt is FULL Code status  Past Medical History:  Diagnosis Date   Dizziness 09/25/2019   GERD (gastroesophageal reflux disease)    HTN (hypertension)     Obesity hypoventilation syndrome (HCC) 04/08/2014   OSA (obstructive sleep apnea) 04/08/2014   PFO (patent foramen ovale)    Sleep apnea     Past Surgical History:  Procedure Laterality Date   two knee surgeries Left 1995    Allergies: Chlorthalidone and Lisinopril  Medications: Prior to Admission medications   Medication Sig Start Date End Date Taking? Authorizing Provider  acetaminophen (TYLENOL) 500 MG tablet Take 1 tablet (500 mg total) by mouth every 6 (six) hours as needed. Patient taking differently: Take 1,000 mg by mouth every 6 (six) hours as needed for mild pain. 11/13/22   Charlesetta Shanks, MD  amLODipine (NORVASC) 10 MG tablet Take 1 tablet (10 mg total) by mouth daily. 11/09/22   Tobb, Kardie, DO  cyclobenzaprine (FLEXERIL) 10 MG tablet Take 10 mg by mouth at bedtime. 11/21/22   [provider]  gabapentin (NEURONTIN) 100 MG capsule Take 100 mg by mouth at bedtime. 11/21/22   [provider]  hydrALAZINE (APRESOLINE) 100 MG tablet Take 1 tablet (100 mg total) by mouth 3 (three) times daily. Patient taking differently: Take 100 mg by mouth 2 (two) times daily. 08/24/22   Skeet Latch, MD  isosorbide mononitrate (IMDUR) 30 MG 24 hr tablet Take 1 tablet (30 mg total) by mouth daily. 11/22/22 02/20/23  Pokhrel, Corrie Mckusick, MD  ketorolac (TORADOL) 10 MG tablet Take 10 mg by mouth 3 (three) times daily. 11/18/22   [provider]  labetalol (NORMODYNE) 300 MG tablet Take 1 tablet (300 mg total) by mouth 3 (three) times daily. 11/21/22   Pokhrel, Corrie Mckusick, MD  omeprazole (PRILOSEC)  40 MG capsule Take 1 capsule by mouth daily. 11/22/22   [provider]  pantoprazole (PROTONIX) 40 MG tablet Take 1 tablet (40 mg total) by mouth 2 (two) times daily before a meal for 14 days, THEN 1 tablet (40 mg total) daily. 11/21/22 01/04/23  Pokhrel, Corrie Mckusick, MD  Semaglutide-Weight Management (WEGOVY) 1 MG/0.5ML SOAJ Inject 1 mg into the skin every 7 (seven) days. 08/24/22    Skeet Latch, MD  spironolactone (ALDACTONE) 25 MG tablet TAKE 1 TABLET(25 MG) BY MOUTH DAILY Patient taking differently: Take 25 mg by mouth daily. 11/08/22   Loel Dubonnet, NP  topiramate (TOPAMAX) 50 MG tablet Take 50 mg by mouth 2 (two) times daily. 11/21/22   [provider]  traMADol (ULTRAM) 50 MG tablet 1 to 2 tablets every 6 hours with extra strength Tylenol for pain as needed Patient taking differently: Take 50-100 mg by mouth every 6 (six) hours as needed for moderate pain. Can take with extra strength Tylenol for pain as needed 11/13/22   Charlesetta Shanks, MD  valsartan (DIOVAN) 160 MG tablet Take 1 tablet (160 mg total) by mouth 2 (two) times daily. 07/01/22   Loel Dubonnet, NP  LISINOPRIL-HYDROCHLOROTHIAZIDE PO Take by mouth.  07/06/19  [provider]     Family History  Problem Relation Age of Onset   Hypertension Mother    Multiple sclerosis Mother    Hypertension Father    Hypertension Brother    Hyperlipidemia Paternal Grandfather    Heart failure Paternal Grandfather    Diabetes Maternal Grandmother    Heart failure Maternal Grandfather    CVA Maternal Uncle     Social History   Socioeconomic History   Marital status: Single    Spouse name: Not on file   Number of children: 2   Years of education: college   Highest education level: Some college, no degree  Occupational History   Occupation: unemployment    Fish farm manager: UNEMPLOYED    Employer: [employer]  Tobacco Use   Smoking status: Former    Packs/day: 0.10    Years: 16.00    Total pack years: 1.60    Types: Cigarettes    Quit date: 06/08/2022    Years since quitting: 0.4   Smokeless tobacco: Never  Vaping Use   Vaping Use: Never used  Substance and Sexual Activity   Alcohol use: Not Currently    Comment: sparingly    Drug use: No   Sexual activity: Yes    Partners: Female  Other Topics Concern   Not on file  Social History Narrative   Nature conservation officer    Patient is single and lives alone.   Patient is left-handed.   Patient does not drink any caffeine.   Social Determinants of Health   Financial Resource Strain: Low Risk  (07/01/2022)   Overall Financial Resource Strain (CARDIA)    Difficulty of Paying Living Expenses: Not hard at all  Food Insecurity: No Food Insecurity (11/20/2022)   Hunger Vital Sign    Worried About Running Out of Food in the Last Year: Never true    Ran Out of Food in the Last Year: Never true  Transportation Needs: No Transportation Needs (11/20/2022)   PRAPARE - Hydrologist (Medical): No    Lack of Transportation (Non-Medical): No  Physical Activity: Insufficiently Active (07/01/2022)   Exercise Vital Sign    Days of Exercise per Week: 3 days    Minutes of Exercise  per Session: 30 min  Stress: No Stress Concern Present (07/01/2022)   [place]    Feeling of Stress : Only a little  Social Connections: Socially Isolated (07/01/2022)   Social Connection and Isolation Panel [NHANES]    Frequency of Communication with Friends and Family: Once a week    Frequency of Social Gatherings with Friends and Family: Once a week    Attends Religious Services: 1 to 4 times per year    Active Member of [employer] or Organizations: No    Attends Archivist Meetings: Never    Marital Status: Never married    Review of Systems: A 12 point ROS discussed and pertinent positives are indicated in the HPI above.  All other systems are negative.  Review of Systems  Constitutional:  Positive for fatigue. Negative for activity change and fever.  Respiratory:  Negative for cough and shortness of breath.   Cardiovascular:  Positive for leg swelling. Negative for chest pain.  Gastrointestinal:  Negative for diarrhea, nausea and vomiting.  Psychiatric/Behavioral:  Negative for behavioral problems and confusion.     Vital Signs: BP (!)  142/73   Pulse 67   Temp (!) 97 F (36.1 C) (Temporal)   Resp 17   Ht '6\' 1"'$  (1.854 m)   Wt (!) 360 lb (163.3 kg)   SpO2 99%   BMI 47.50 kg/m     Physical Exam Vitals reviewed.  Constitutional:      Appearance: He is obese.  HENT:     Mouth/Throat:     Mouth: Mucous membranes are moist.  Cardiovascular:     Rate and Rhythm: Normal rate and regular rhythm.     Heart sounds: Normal heart sounds.  Pulmonary:     Effort: Pulmonary effort is normal.     Breath sounds: Normal breath sounds.  Abdominal:     Palpations: Abdomen is soft.     Tenderness: There is no abdominal tenderness.  Musculoskeletal:        General: Swelling present. No tenderness.     Right lower leg: Edema present.     Left lower leg: Edema present.  Skin:    General: Skin is warm.  Neurological:     Mental Status: He is alert and oriented to person, place, and time.  Psychiatric:        Behavior: Behavior normal.     Imaging: ECHOCARDIOGRAM COMPLETE  Result Date: 11/20/2022    ECHOCARDIOGRAM REPORT   Patient Name:   Calvin Bailey Date of Exam: 11/20/2022 Medical Rec #:  [id number]        Height:       72.0 in Accession #:    [id number]       Weight:       366.0 lb Date of Birth:  [birth year]        BSA:          2.754 m Patient Age:    64 years         BP:           132/75 mmHg Patient Gender: M                HR:           62 bpm. Exam Location:  Inpatient Procedure: 2D Echo and Intracardiac Opacification Agent Indications:    chest pain  History:        Patient has prior history of Echocardiogram examinations,  most                 recent 02/27/2022. CHF; Risk Factors:Hypertension.  Sonographer:    Harvie Junior Referring Phys: [id number] [facility] A Gasper Sells  Sonographer Comments: Patient is obese. Image acquisition challenging due to patient body habitus. IMPRESSIONS  1. Left ventricular ejection fraction, by estimation, is 60 to 65%. The left ventricle has normal function. The left ventricle has no regional  wall motion abnormalities. There is mild concentric left ventricular hypertrophy. Left ventricular diastolic parameters were normal.  2. Right ventricular systolic function is normal. The right ventricular size is normal. There is normal pulmonary artery systolic pressure.  3. The mitral valve is normal in structure. Trivial mitral valve regurgitation. No evidence of mitral stenosis.  4. The aortic valve is tricuspid. Aortic valve regurgitation is mild. No aortic stenosis is present.  5. The inferior vena cava is normal in size with greater than 50% respiratory variability, suggesting right atrial pressure of 3 mmHg. Comparison(s): No significant change from prior study. FINDINGS  Left Ventricle: Left ventricular ejection fraction, by estimation, is 60 to 65%. The left ventricle has normal function. The left ventricle has no regional wall motion abnormalities. Definity contrast agent was given IV to delineate the left ventricular  endocardial borders. The left ventricular internal cavity size was normal in size. There is mild concentric left ventricular hypertrophy. Left ventricular diastolic parameters were normal. Right Ventricle: The right ventricular size is normal. No increase in right ventricular wall thickness. Right ventricular systolic function is normal. There is normal pulmonary artery systolic pressure. The tricuspid regurgitant velocity is 2.24 m/s, and  with an assumed right atrial pressure of 3 mmHg, the estimated right ventricular systolic pressure is [id number] mmHg. Left Atrium: Left atrial size was normal in size. Right Atrium: Right atrial size was normal in size. Pericardium: There is no evidence of pericardial effusion. Mitral Valve: The mitral valve is normal in structure. Trivial mitral valve regurgitation. No evidence of mitral valve stenosis. Tricuspid Valve: The tricuspid valve is normal in structure. Tricuspid valve regurgitation is trivial. Aortic Valve: The aortic valve is tricuspid. Aortic  valve regurgitation is mild. Aortic regurgitation PHT measures 572 msec. No aortic stenosis is present. Aortic valve mean gradient measures 6.0 mmHg. Aortic valve peak gradient measures 10.4 mmHg. Aortic valve area, by VTI measures 2.98 cm. Pulmonic Valve: The pulmonic valve was normal in structure. Pulmonic valve regurgitation is trivial. Aorta: The aortic root is normal in size and structure. Venous: The inferior vena cava is normal in size with greater than 50% respiratory variability, suggesting right atrial pressure of 3 mmHg. IAS/Shunts: The atrial septum is grossly normal.  LEFT VENTRICLE PLAX 2D LVIDd:         5.90 cm      Diastology LVIDs:         3.60 cm      LV e' medial:    7.94 cm/s LV PW:         1.50 cm      LV E/e' medial:  11.3 LV IVS:        1.50 cm      LV e' lateral:   11.00 cm/s LVOT diam:     2.10 cm      LV E/e' lateral: 8.2 LV SV:         103 LV SV Index:   38 LVOT Area:     3.46 cm  LV Volumes (MOD) LV vol d, MOD A2C: 133.0 ml  LV vol d, MOD A4C: 215.0 ml LV vol s, MOD A2C: 58.2 ml LV vol s, MOD A4C: 84.5 ml LV SV MOD A2C:     74.8 ml LV SV MOD A4C:     215.0 ml LV SV MOD BP:      107.0 ml RIGHT VENTRICLE RV Basal diam:  3.80 cm RV Mid diam:    3.10 cm RV S prime:     15.70 cm/s TAPSE (M-mode): 2.0 cm LEFT ATRIUM             Index        RIGHT ATRIUM           Index LA diam:        3.90 cm 1.42 cm/m   RA Area:     17.30 cm LA Vol (A2C):   81.5 ml 29.59 ml/m  RA Volume:   43.70 ml  15.87 ml/m LA Vol (A4C):   79.9 ml 29.01 ml/m LA Biplane Vol: 82.4 ml 29.92 ml/m  AORTIC VALVE                     PULMONIC VALVE AV Area (Vmax):    2.98 cm      PV Vmax:       1.21 m/s AV Area (Vmean):   2.91 cm      PV Peak grad:  5.9 mmHg AV Area (VTI):     2.98 cm AV Vmax:           161.00 cm/s AV Vmean:          108.000 cm/s AV VTI:            0.347 m AV Peak Grad:      10.4 mmHg AV Mean Grad:      6.0 mmHg LVOT Vmax:         138.50 cm/s LVOT Vmean:        90.700 cm/s LVOT VTI:          0.298 m  LVOT/AV VTI ratio: 0.86 AI PHT:            572 msec  AORTA Ao Root diam: 3.60 cm Ao Asc diam:  3.30 cm MITRAL VALVE               TRICUSPID VALVE MV Area (PHT): 3.53 cm    TR Peak grad:   20.1 mmHg MV Decel Time: 215 msec    TR Vmax:        224.00 cm/s MV E velocity: 89.70 cm/s MV A velocity: 78.70 cm/s  SHUNTS MV E/A ratio:  1.14        Systemic VTI:  0.30 m                            Systemic Diam: 2.10 cm Gwyndolyn Kaufman MD Electronically signed by Gwyndolyn Kaufman MD Signature Date/Time: 11/20/2022/5:09:49 PM    Final    DG Chest Port 1 View  Result Date: 11/20/2022 CLINICAL DATA:  [id number] with chest pain EXAM: PORTABLE CHEST 1 VIEW COMPARISON:  CTA chest 11/13/2022. FINDINGS: There is mild cardiomegaly without overt CHF findings. The mediastinum is stable. Both lungs are clear. The visualized skeletal structures are unremarkable. IMPRESSION: Mild cardiomegaly without overt CHF findings.  Both lungs are clear. Electronically Signed   By: Telford Nab M.D.   On: 11/20/2022 06:27   CT Angio Chest PE W/Cm &/Or Wo Cm  Result Date:  11/13/2022 CLINICAL DATA:  Chest pain. Reported mass of unclear etiology. Also with abdominal pain. EXAM: CT ANGIOGRAPHY CHEST CT ABDOMEN AND PELVIS WITH CONTRAST TECHNIQUE: Multidetector CT imaging of the chest was performed using the standard protocol during bolus administration of intravenous contrast. Multiplanar CT image reconstructions and MIPs were obtained to evaluate the vascular anatomy. Multidetector CT imaging of the abdomen and pelvis was performed using the standard protocol during bolus administration of intravenous contrast. RADIATION DOSE REDUCTION: This exam was performed according to the departmental dose-optimization program which includes automated exposure control, adjustment of the mA and/or kV according to patient size and/or use of iterative reconstruction technique. CONTRAST:  19m OMNIPAQUE IOHEXOL 350 MG/ML SOLN COMPARISON:  CT abdomen and pelvis,  06/26/2022. Current chest radiograph. CT chest, 07/08/2017. FINDINGS: CTA CHEST FINDINGS Cardiovascular: Exam somewhat limited by suboptimal opacification of the pulmonary arteries and respiratory motion. Allowing for this, there is no evidence of a pulmonary embolism. Heart normal in size and configuration. No pericardial effusion. Normal great vessels. Mediastinum/Nodes: No neck base, mediastinal or hilar masses. No enlarged lymph nodes. Trachea and esophagus are unremarkable. Lungs/Pleura: Lungs are clear. No pleural effusion or pneumothorax. Musculoskeletal: No fracture or acute finding.  No bone lesion. Oval, low-attenuation, circumscribed subcutaneous mass, right back, 4 x 3.2 x 4.3 cm. Although nonspecific, this may reflect a sebaceous/inclusion cyst. It is new compared to the prior chest CT from 2018. Review of the MIP images confirms the above findings. CT ABDOMEN and PELVIS FINDINGS Hepatobiliary: No focal liver abnormality is seen. No gallstones, gallbladder wall thickening, or biliary dilatation. Pancreas: Unremarkable. No pancreatic ductal dilatation or surrounding inflammatory changes. Spleen: Normal in size without focal abnormality. Adrenals/Urinary Tract: Normal adrenal glands. Kidneys normal in size, orientation and position. 3.1 cm mass, upper pole the right kidney, stable from the prior CT, consistent with a hemorrhagic cyst or cyst with high protein content. Two adjacent small cysts in the midpole the right kidney, similar size, 1 cm, also unchanged. No follow-up these masses is recommended. Small hypoattenuating area along the posterior aspect of the left kidney, not fully defined, likely a cyst. No other renal masses, no stones and no hydronephrosis. Normal ureters. Bladder is unremarkable. Stomach/Bowel: Normal stomach. Small bowel and colon are normal in caliber. No wall thickening. No inflammation. Scattered left colon diverticula. Normal appendix visualized. Vascular/Lymphatic: No vascular  abnormality. Mildly enlarged pelvic lymph nodes, largest along the external iliac chains, 1 on each side measuring 1.3 cm in short axis. These are larger than on the prior CT. No abdominal lymphadenopathy. Reproductive: Unremarkable. Other: Small fat containing umbilical hernia.  No ascites. Musculoskeletal: No fracture or acute finding.  No bone lesion. Review of the MIP images confirms the above findings. IMPRESSION: CTA CHEST 1. Somewhat limited study as detailed above. No evidence of a pulmonary embolism. 2. No acute findings.  Clear lungs. 3. 4.3 cm right posterior chest wall subcutaneous mass, benign-appearing with well-defined margins and low-attenuation, likely a sebaceous cyst or epidermal inclusion cyst. CT ABDOMEN AND PELVIS 1. No acute findings within the abdomen or pelvis. 2. Normal appendix. 3. Nonspecific mild pelvic lymphadenopathy, nodes up to 1.3 cm in short axis, increased in size compared to the prior abdomen and pelvis CT from 06/26/2022. Electronically Signed   By: DLajean ManesM.D.   On: 11/13/2022 10:29   CT ABDOMEN PELVIS W CONTRAST  Result Date: 11/13/2022 CLINICAL DATA:  Chest pain. Reported mass of unclear etiology. Also with abdominal pain. EXAM: CT ANGIOGRAPHY CHEST CT ABDOMEN  AND PELVIS WITH CONTRAST TECHNIQUE: Multidetector CT imaging of the chest was performed using the standard protocol during bolus administration of intravenous contrast. Multiplanar CT image reconstructions and MIPs were obtained to evaluate the vascular anatomy. Multidetector CT imaging of the abdomen and pelvis was performed using the standard protocol during bolus administration of intravenous contrast. RADIATION DOSE REDUCTION: This exam was performed according to the departmental dose-optimization program which includes automated exposure control, adjustment of the mA and/or kV according to patient size and/or use of iterative reconstruction technique. CONTRAST:  178m OMNIPAQUE IOHEXOL 350 MG/ML SOLN  COMPARISON:  CT abdomen and pelvis, 06/26/2022. Current chest radiograph. CT chest, 07/08/2017. FINDINGS: CTA CHEST FINDINGS Cardiovascular: Exam somewhat limited by suboptimal opacification of the pulmonary arteries and respiratory motion. Allowing for this, there is no evidence of a pulmonary embolism. Heart normal in size and configuration. No pericardial effusion. Normal great vessels. Mediastinum/Nodes: No neck base, mediastinal or hilar masses. No enlarged lymph nodes. Trachea and esophagus are unremarkable. Lungs/Pleura: Lungs are clear. No pleural effusion or pneumothorax. Musculoskeletal: No fracture or acute finding.  No bone lesion. Oval, low-attenuation, circumscribed subcutaneous mass, right back, 4 x 3.2 x 4.3 cm. Although nonspecific, this may reflect a sebaceous/inclusion cyst. It is new compared to the prior chest CT from 2018. Review of the MIP images confirms the above findings. CT ABDOMEN and PELVIS FINDINGS Hepatobiliary: No focal liver abnormality is seen. No gallstones, gallbladder wall thickening, or biliary dilatation. Pancreas: Unremarkable. No pancreatic ductal dilatation or surrounding inflammatory changes. Spleen: Normal in size without focal abnormality. Adrenals/Urinary Tract: Normal adrenal glands. Kidneys normal in size, orientation and position. 3.1 cm mass, upper pole the right kidney, stable from the prior CT, consistent with a hemorrhagic cyst or cyst with high protein content. Two adjacent small cysts in the midpole the right kidney, similar size, 1 cm, also unchanged. No follow-up these masses is recommended. Small hypoattenuating area along the posterior aspect of the left kidney, not fully defined, likely a cyst. No other renal masses, no stones and no hydronephrosis. Normal ureters. Bladder is unremarkable. Stomach/Bowel: Normal stomach. Small bowel and colon are normal in caliber. No wall thickening. No inflammation. Scattered left colon diverticula. Normal appendix  visualized. Vascular/Lymphatic: No vascular abnormality. Mildly enlarged pelvic lymph nodes, largest along the external iliac chains, 1 on each side measuring 1.3 cm in short axis. These are larger than on the prior CT. No abdominal lymphadenopathy. Reproductive: Unremarkable. Other: Small fat containing umbilical hernia.  No ascites. Musculoskeletal: No fracture or acute finding.  No bone lesion. Review of the MIP images confirms the above findings. IMPRESSION: CTA CHEST 1. Somewhat limited study as detailed above. No evidence of a pulmonary embolism. 2. No acute findings.  Clear lungs. 3. 4.3 cm right posterior chest wall subcutaneous mass, benign-appearing with well-defined margins and low-attenuation, likely a sebaceous cyst or epidermal inclusion cyst. CT ABDOMEN AND PELVIS 1. No acute findings within the abdomen or pelvis. 2. Normal appendix. 3. Nonspecific mild pelvic lymphadenopathy, nodes up to 1.3 cm in short axis, increased in size compared to the prior abdomen and pelvis CT from 06/26/2022. Electronically Signed   By: DLajean ManesM.D.   On: 11/13/2022 10:29   DG Chest 2 View  Result Date: 11/13/2022 CLINICAL DATA:  Right-sided chest pain EXAM: CHEST - 2 VIEW COMPARISON:  04/10/2020 FINDINGS: Normal heart size and mediastinal contours. No acute infiltrate or edema. No effusion or pneumothorax. No acute osseous findings. Artifact from EKG leads. IMPRESSION: No evidence of  active disease. Electronically Signed   By: Jorje Guild M.D.   On: 11/13/2022 07:24    Labs:  CBC: Recent Labs    11/13/22 0645 11/20/22 0536 11/21/22 0435 11/23/22 1355  WBC 10.8* 11.3* 10.8* 11.1*  HGB 13.5 14.7 13.8 13.5  HCT 40.6 44.7 41.8 41.0  PLT 231 313 284 299    COAGS: Recent Labs    02/26/22 1530  INR 1.0  APTT 35    BMP: Recent Labs    06/26/22 1729 11/13/22 0645 11/20/22 0536 11/21/22 0435 11/23/22 1355  NA 137 137 136 134* 140  K 4.0 3.6 4.3 4.1 4.4  CL 104 104 104 103 106  CO2  '22 23 23 24 20  '$ GLUCOSE 89 142* 93 93 146*  BUN 27* 15 29* 32* 25*  CALCIUM 9.7 8.7* 8.9 9.0 8.8  CREATININE 1.92* 1.46* 1.61* 1.73* 1.96*  GFRNONAA 44* >60 54* 50*  --     LIVER FUNCTION TESTS: Recent Labs    05/25/22 2205 06/26/22 1729 11/13/22 0919 11/20/22 0536  BILITOT 0.5 0.6 0.6 0.5  AST 20 14* 14* 16  ALT 32 '23 16 18  '$ ALKPHOS 75 79 70 68  PROT 7.4 8.2* 7.0 7.8  ALBUMIN 4.5 4.8 3.7 4.1    TUMOR MARKERS: No results for input(s): "AFPTM", "CEA", "CA199", "CHROMGRNA" in the last 8760 hours.  Assessment and Plan:  Right renal lesions Was seen in consultation with Dr Anselm Pancoast regarding renal lesion ablation Scheduled today for right interpolar lesion biopsy prior to any treatments Risks and benefits of right renal interpolar lesion biopsy was discussed with the patient and/or patient's family including, but not limited to bleeding, infection, damage to adjacent structures or low yield requiring additional tests.  All of the questions were answered and there is agreement to proceed. Consent signed and in chart.  Thank you for this interesting consult.  I greatly enjoyed meeting KEEVON SLATER and look forward to participating in their care.  A copy of this report was sent to the requesting provider on this date.  Electronically Signed: Lavonia Drafts, PA-C 12/07/2022, 7:14 AM   I spent a total of    25 Minutes in face to face in clinical consultation, greater than 50% of which was counseling/coordinating care for right renal lesion biopsy

## 2022-12-07 NOTE — Procedures (Signed)
Interventional Radiology Procedure:   Indications: Indeterminate renal lesions  Procedure: US guided right renal lesion biopsy  Findings: Small exophytic lesion in right kidney interpolar region.  Technically challenging biopsy due to adjacent bowel and body habitus.  2 core biopsies obtained.  No immediate hematoma.  Complications: No immediate complications noted.     EBL: Minimal  Plan: Strict bedrest 4 hours, then plan to discharge home.    Rebecca Cairns R. Anselm Pancoast, MD  Pager: [phone]

## 2022-12-08 ENCOUNTER — Ambulatory Visit (HOSPITAL_BASED_OUTPATIENT_CLINIC_OR_DEPARTMENT_OTHER): Payer: 59 | Admitting: Family Medicine

## 2022-12-08 ENCOUNTER — Encounter (HOSPITAL_BASED_OUTPATIENT_CLINIC_OR_DEPARTMENT_OTHER): Payer: Self-pay | Admitting: Family Medicine

## 2022-12-08 VITALS — BP 166/89 | HR 75 | Ht 73.0 in | Wt 360.0 lb

## 2022-12-08 DIAGNOSIS — M109 Gout, unspecified: Secondary | ICD-10-CM | POA: Diagnosis not present

## 2022-12-08 MED ORDER — COLCHICINE 0.6 MG PO TABS: 0.6000 mg | ORAL_TABLET | Freq: Two times a day (BID) | ORAL | 0 refills | Status: DC

## 2022-12-08 NOTE — Progress Notes (Signed)
   Acute Office Visit  Subjective:     Patient ID: Calvin Bailey, male    DOB: [birth year], 42 y.o.   MRN: [id number]  Chief Complaint  Patient presents with   Foot Swelling    Gout flare, right foot. Started about a week ago   Noticed gout flare about a week ago. Did not notice any precipitating factors. Reports he usually drinks cherry juice and eats cherries to help alleviate symptoms. However, this method did not help.    Review of Systems  Constitutional:  Negative for chills and fever.  Respiratory:  Negative for cough.   Cardiovascular:  Negative for chest pain.  Musculoskeletal:  Positive for joint pain (R great toe).        Objective:    BP (!) 166/89   Pulse 75   Ht 6\' 1"  (1.854 m)   Wt (!) 360 lb (163.3 kg)   SpO2 99%   BMI 47.50 kg/m    Physical Exam Constitutional:      Appearance: Normal appearance.  Musculoskeletal:     Right foot: Prominent metatarsal heads (R great toe swollen) present.  Feet:     Right foot:     Skin integrity: Warmth present.  Neurological:     Mental Status: He is alert.  Psychiatric:        Mood and Affect: Mood normal.        Behavior: Behavior normal.        Thought Content: Thought content normal.        Judgment: Judgment normal.          Assessment & Plan:   Problem List Items Addressed This Visit       Musculoskeletal and Integument   Acute gout involving toe of right foot - Primary   Relevant Medications   colchicine 0.6 MG tablet   Advised patient normal uric acid levels are common during an acute attack; therefore, elevated uric acid levels are not diagnostic of gout. Based on clinical presentation, will treat for acute gout flare. Instructed patient that next time he experiences a gout flare, it is best to take medication within the first day of pain. Advised he can take colchicine 1.2mg  initially then 0.6mg  an hour later, then wait 12 hours to take 0.6mg , only during an acute flare. This is not a  long-term prophylactic gout medication. Patient states that he only has a gout flare "once a year." Educated pt to adequately hydrate and avoid contributing substances (alcohol, red meat, seafood). Advised patient to call office if no improvement.   Meds ordered this encounter  Medications   colchicine 0.6 MG tablet    Sig: Take 1 tablet (0.6 mg total) by mouth 2 (two) times daily.    Dispense:  60 tablet    Refill:  0    Order Specific Question:   Supervising Provider    Answer:   DE [place], [name] [[id number]    Return if symptoms worsen or fail to improve.  Les Pou, FNP

## 2022-12-09 LAB — SURGICAL PATHOLOGY

## 2022-12-09 NOTE — Addendum Note (Signed)
Addended by: Les Pou on: 12/09/2022 01:23 PM   Modules accepted: Level of Service

## 2022-12-10 ENCOUNTER — Other Ambulatory Visit: Payer: Self-pay | Admitting: Diagnostic Radiology

## 2022-12-10 DIAGNOSIS — N2889 Other specified disorders of kidney and ureter: Secondary | ICD-10-CM

## 2022-12-14 ENCOUNTER — Ambulatory Visit
Admission: RE | Admit: 2022-12-14 | Discharge: 2022-12-14 | Disposition: A | Discharge status: Home / Self Care | Payer: 59 | Source: Ambulatory Visit | Attending: Diagnostic Radiology | Admitting: Diagnostic Radiology

## 2022-12-14 DIAGNOSIS — N2889 Other specified disorders of kidney and ureter: Secondary | ICD-10-CM

## 2022-12-14 NOTE — Progress Notes (Addendum)
Chief Complaint: Patient was seen in consultation today for follow up after right renal lesion biopsy  Referring Physician(s): Ellison Hughs  History of Present Illness: Calvin Bailey is a 42 y.o. male with suspicious renal lesions, hypertension, obesity and renal insufficiency.  I saw the patient in clinic on 11/09/2022 and we decided to proceed with image guided biopsy of the suspicious lesion in the right kidney interpolar region.  Patient underwent a successful ultrasound-guided biopsy of this lesion on 12/07/2022.  Biopsy demonstrated papillary renal cell carcinoma, solid type I, nuclear grade 2.  Patient was tired for a few days after the biopsy procedure but no flank pain or urinary symptoms.  Patient is anxious about the other lesion in the right kidney upper pole.  Past Medical History:  Diagnosis Date   Dizziness 09/25/2019   GERD (gastroesophageal reflux disease)    HTN (hypertension)    Obesity hypoventilation syndrome (HCC) 04/08/2014   OSA (obstructive sleep apnea) 04/08/2014   PFO (patent foramen ovale)    Sleep apnea     Past Surgical History:  Procedure Laterality Date   two knee surgeries Left 1995    Allergies: Chlorthalidone and Lisinopril  Medications: Prior to Admission medications   Medication Sig Start Date End Date Taking? Authorizing Provider  acetaminophen (TYLENOL) 500 MG tablet Take 1 tablet (500 mg total) by mouth every 6 (six) hours as needed. Patient taking differently: Take 1,000 mg by mouth every 6 (six) hours as needed for mild pain. 11/13/22   Charlesetta Shanks, MD  amLODipine (NORVASC) 10 MG tablet Take 1 tablet (10 mg total) by mouth daily. 11/09/22   Tobb, Kardie, DO  colchicine 0.6 MG tablet Take 1 tablet (0.6 mg total) by mouth 2 (two) times daily. 12/08/22   Les Pou, FNP  cyclobenzaprine (FLEXERIL) 10 MG tablet Take 10 mg by mouth at bedtime. 11/21/22   [provider]  gabapentin (NEURONTIN) 100 MG capsule Take  100 mg by mouth at bedtime. 11/21/22   [provider]  hydrALAZINE (APRESOLINE) 100 MG tablet Take 1 tablet (100 mg total) by mouth 3 (three) times daily. Patient taking differently: Take 100 mg by mouth 2 (two) times daily. 08/24/22   Skeet Latch, MD  isosorbide mononitrate (IMDUR) 30 MG 24 hr tablet Take 1 tablet (30 mg total) by mouth daily. 11/22/22 02/20/23  Pokhrel, Corrie Mckusick, MD  ketorolac (TORADOL) 10 MG tablet Take 10 mg by mouth 3 (three) times daily. 11/18/22   [provider]  labetalol (NORMODYNE) 300 MG tablet Take 1 tablet (300 mg total) by mouth 3 (three) times daily. 11/21/22   Pokhrel, Corrie Mckusick, MD  omeprazole (PRILOSEC) 40 MG capsule Take 1 capsule by mouth daily. 11/22/22   [provider]  pantoprazole (PROTONIX) 40 MG tablet Take 1 tablet (40 mg total) by mouth 2 (two) times daily before a meal for 14 days, THEN 1 tablet (40 mg total) daily. 11/21/22 01/04/23  Pokhrel, Corrie Mckusick, MD  Semaglutide-Weight Management (WEGOVY) 1 MG/0.5ML SOAJ Inject 1 mg into the skin every 7 (seven) days. 08/24/22   Skeet Latch, MD  spironolactone (ALDACTONE) 25 MG tablet TAKE 1 TABLET(25 MG) BY MOUTH DAILY Patient taking differently: Take 25 mg by mouth daily. 11/08/22   Loel Dubonnet, NP  topiramate (TOPAMAX) 50 MG tablet Take 50 mg by mouth 2 (two) times daily. 11/21/22   [provider]  traMADol (ULTRAM) 50 MG tablet 1 to 2 tablets every 6 hours with extra strength Tylenol for pain as  needed Patient taking differently: Take 50-100 mg by mouth every 6 (six) hours as needed for moderate pain. Can take with extra strength Tylenol for pain as needed 11/13/22   Charlesetta Shanks, MD  valsartan (DIOVAN) 160 MG tablet Take 1 tablet (160 mg total) by mouth 2 (two) times daily. 07/01/22   Loel Dubonnet, NP  LISINOPRIL-HYDROCHLOROTHIAZIDE PO Take by mouth.  07/06/19  [provider]     Family History  Problem Relation Age of Onset   Hypertension Mother     Multiple sclerosis Mother    Hypertension Father    Hypertension Brother    Hyperlipidemia Paternal Grandfather    Heart failure Paternal Grandfather    Diabetes Maternal Grandmother    Heart failure Maternal Grandfather    CVA Maternal Uncle     Social History   Socioeconomic History   Marital status: Single    Spouse name: Not on file   Number of children: 2   Years of education: college   Highest education level: Some college, no degree  Occupational History   Occupation: unemployment    Fish farm manager: UNEMPLOYED    Employer: [employer]  Tobacco Use   Smoking status: Former    Packs/day: 0.10    Years: 16.00    Additional pack years: 0.00    Total pack years: 1.60    Types: Cigarettes    Quit date: 06/08/2022    Years since quitting: 0.5   Smokeless tobacco: Never  Vaping Use   Vaping Use: Never used  Substance and Sexual Activity   Alcohol use: Not Currently    Comment: sparingly    Drug use: No   Sexual activity: Yes    Partners: Female  Other Topics Concern   Not on file  Social History Narrative   Nature conservation officer   Patient is single and lives alone.   Patient is left-handed.   Patient does not drink any caffeine.   Social Determinants of Health   Financial Resource Strain: Low Risk  (07/01/2022)   Overall Financial Resource Strain (CARDIA)    Difficulty of Paying Living Expenses: Not hard at all  Food Insecurity: No Food Insecurity (11/20/2022)   Hunger Vital Sign    Worried About Running Out of Food in the Last Year: Never true    Ran Out of Food in the Last Year: Never true  Transportation Needs: No Transportation Needs (11/20/2022)   PRAPARE - Hydrologist (Medical): No    Lack of Transportation (Non-Medical): No  Physical Activity: Insufficiently Active (07/01/2022)   Exercise Vital Sign    Days of Exercise per Week: 3 days    Minutes of Exercise per Session: 30 min  Stress: No Stress Concern Present (07/01/2022)    [place]    Feeling of Stress : Only a little  Social Connections: Socially Isolated (07/01/2022)   Social Connection and Isolation Panel [NHANES]    Frequency of Communication with Friends and Family: Once a week    Frequency of Social Gatherings with Friends and Family: Once a week    Attends Religious Services: 1 to 4 times per year    Active Member of [employer] or Organizations: No    Attends Archivist Meetings: Never    Marital Status: Never married     Review of Systems  Gastrointestinal:  Positive for constipation.  Genitourinary: Negative.     Vital Signs: BP 138/72   Pulse  72   Temp 99 F (37.2 C) (Oral)   SpO2 97% Comment: room air    Physical Exam Constitutional:      Appearance: He is obese. He is not ill-appearing.  Pulmonary:     Effort: Pulmonary effort is normal.  Neurological:     Mental Status: He is alert.        Imaging: US BIOPSY (KIDNEY)  Result Date: 12/07/2022 INDICATION: 42 year old with indeterminate renal lesions. Plan for biopsy of the right kidney interpolar lesion. EXAM: ULTRASOUND-GUIDED BIOPSY OF RIGHT RENAL LESION MEDICATIONS: Moderate sedation ANESTHESIA/SEDATION: Moderate (conscious) sedation was employed during this procedure. A total of Versed 3 mg and Fentanyl 175 mcg was administered intravenously by the radiology nurse. Total intra-service moderate Sedation Time: 34 minutes. The patient's level of consciousness and vital signs were monitored continuously by radiology nursing throughout the procedure under my direct supervision. FLUOROSCOPY TIME:  None COMPLICATIONS: None immediate. PROCEDURE: Informed written consent was obtained from the patient after a thorough discussion of the procedural risks, benefits and alternatives. All questions were addressed. Maximal Sterile Barrier Technique was utilized including caps, mask, sterile gowns, sterile gloves, sterile  drape, hand hygiene and skin antiseptic. A timeout was performed prior to the initiation of the procedure. Patient was initially placed prone but the right renal lesion was difficult to visualize. Patient was repositioned with the left side down. Right renal lesion was visualized in this position using the DAX ultrasound probe. Right side of the abdomen was prepped with chlorhexidine and sterile field was created. Skin was anesthetized using 1% lidocaine. Small incision was made. Using ultrasound guidance, 17 gauge coaxial needle was directed towards the right kidney interpolar lesion. Procedure was technically difficult due to adjacent bowel, overlying ribs and patient's body habitus. The coaxial needle was placed within the exophytic lesion and 2 core biopsies were obtained with an 18 gauge core device. Specimens placed in formalin. 17 gauge coaxial needle was removed without complication. Bandage placed over the puncture site. FINDINGS: Heterogeneous exophytic lesion in the right kidney interpolar region measures roughly 2 cm. Technically challenging procedure due to adjacent bowel and body habitus. Biopsy needle was confirmed within the lesion. Two adequate core specimens obtained. No immediate bleeding or hematoma formation. IMPRESSION: Ultrasound-guided core biopsy of the right kidney interpolar lesion. Electronically Signed   By: Markus Daft M.D.   On: 12/07/2022 11:27   ECHOCARDIOGRAM COMPLETE  Result Date: 11/20/2022    ECHOCARDIOGRAM REPORT   Patient Name:   KAYVON BROADDUS Date of Exam: 11/20/2022 Medical Rec #:  [id number]        Height:       72.0 in Accession #:    [id number]       Weight:       366.0 lb Date of Birth:  Feb 12, 1981        BSA:          2.754 m Patient Age:    33 years         BP:           132/75 mmHg Patient Gender: M                HR:           62 bpm. Exam Location:  Inpatient Procedure: 2D Echo and Intracardiac Opacification Agent Indications:    chest pain  History:         Patient has prior history of Echocardiogram examinations, most  recent 02/27/2022. CHF; Risk Factors:Hypertension.  Sonographer:    Harvie Junior Referring Phys: [id number] [facility] Gasper Sells  Sonographer Comments: Patient is obese. Image acquisition challenging due to patient body habitus. IMPRESSIONS  1. Left ventricular ejection fraction, by estimation, is 60 to 65%. The left ventricle has normal function. The left ventricle has no regional wall motion abnormalities. There is mild concentric left ventricular hypertrophy. Left ventricular diastolic parameters were normal.  2. Right ventricular systolic function is normal. The right ventricular size is normal. There is normal pulmonary artery systolic pressure.  3. The mitral valve is normal in structure. Trivial mitral valve regurgitation. No evidence of mitral stenosis.  4. The aortic valve is tricuspid. Aortic valve regurgitation is mild. No aortic stenosis is present.  5. The inferior vena cava is normal in size with greater than 50% respiratory variability, suggesting right atrial pressure of 3 mmHg. Comparison(s): No significant change from prior study. FINDINGS  Left Ventricle: Left ventricular ejection fraction, by estimation, is 60 to 65%. The left ventricle has normal function. The left ventricle has no regional wall motion abnormalities. Definity contrast agent was given IV to delineate the left ventricular  endocardial borders. The left ventricular internal cavity size was normal in size. There is mild concentric left ventricular hypertrophy. Left ventricular diastolic parameters were normal. Right Ventricle: The right ventricular size is normal. No increase in right ventricular wall thickness. Right ventricular systolic function is normal. There is normal pulmonary artery systolic pressure. The tricuspid regurgitant velocity is 2.24 m/s, and  with an assumed right atrial pressure of 3 mmHg, the estimated right ventricular systolic pressure  is [id number] mmHg. Left Atrium: Left atrial size was normal in size. Right Atrium: Right atrial size was normal in size. Pericardium: There is no evidence of pericardial effusion. Mitral Valve: The mitral valve is normal in structure. Trivial mitral valve regurgitation. No evidence of mitral valve stenosis. Tricuspid Valve: The tricuspid valve is normal in structure. Tricuspid valve regurgitation is trivial. Aortic Valve: The aortic valve is tricuspid. Aortic valve regurgitation is mild. Aortic regurgitation PHT measures 572 msec. No aortic stenosis is present. Aortic valve mean gradient measures 6.0 mmHg. Aortic valve peak gradient measures 10.4 mmHg. Aortic valve area, by VTI measures 2.98 cm. Pulmonic Valve: The pulmonic valve was normal in structure. Pulmonic valve regurgitation is trivial. Aorta: The aortic root is normal in size and structure. Venous: The inferior vena cava is normal in size with greater than 50% respiratory variability, suggesting right atrial pressure of 3 mmHg. IAS/Shunts: The atrial septum is grossly normal.  LEFT VENTRICLE PLAX 2D LVIDd:         5.90 cm      Diastology LVIDs:         3.60 cm      LV e' medial:    7.94 cm/s LV PW:         1.50 cm      LV E/e' medial:  11.3 LV IVS:        1.50 cm      LV e' lateral:   11.00 cm/s LVOT diam:     2.10 cm      LV E/e' lateral: 8.2 LV SV:         103 LV SV Index:   38 LVOT Area:     3.46 cm  LV Volumes (MOD) LV vol d, MOD A2C: 133.0 ml LV vol d, MOD A4C: 215.0 ml LV vol s, MOD A2C: 58.2 ml LV vol s, MOD  A4C: 84.5 ml LV SV MOD A2C:     74.8 ml LV SV MOD A4C:     215.0 ml LV SV MOD BP:      107.0 ml RIGHT VENTRICLE RV Basal diam:  3.80 cm RV Mid diam:    3.10 cm RV S prime:     15.70 cm/s TAPSE (M-mode): 2.0 cm LEFT ATRIUM             Index        RIGHT ATRIUM           Index LA diam:        3.90 cm 1.42 cm/m   RA Area:     17.30 cm LA Vol (A2C):   81.5 ml 29.59 ml/m  RA Volume:   43.70 ml  15.87 ml/m LA Vol (A4C):   79.9 ml 29.01 ml/m LA  Biplane Vol: 82.4 ml 29.92 ml/m  AORTIC VALVE                     PULMONIC VALVE AV Area (Vmax):    2.98 cm      PV Vmax:       1.21 m/s AV Area (Vmean):   2.91 cm      PV Peak grad:  5.9 mmHg AV Area (VTI):     2.98 cm AV Vmax:           161.00 cm/s AV Vmean:          108.000 cm/s AV VTI:            0.347 m AV Peak Grad:      10.4 mmHg AV Mean Grad:      6.0 mmHg LVOT Vmax:         138.50 cm/s LVOT Vmean:        90.700 cm/s LVOT VTI:          0.298 m LVOT/AV VTI ratio: 0.86 AI PHT:            572 msec  AORTA Ao Root diam: 3.60 cm Ao Asc diam:  3.30 cm MITRAL VALVE               TRICUSPID VALVE MV Area (PHT): 3.53 cm    TR Peak grad:   20.1 mmHg MV Decel Time: 215 msec    TR Vmax:        224.00 cm/s MV E velocity: 89.70 cm/s MV A velocity: 78.70 cm/s  SHUNTS MV E/A ratio:  1.14        Systemic VTI:  0.30 m                            Systemic Diam: 2.10 cm Gwyndolyn Kaufman MD Electronically signed by Gwyndolyn Kaufman MD Signature Date/Time: 11/20/2022/5:09:49 PM    Final    DG Chest Port 1 View  Result Date: 11/20/2022 CLINICAL DATA:  [id number] with chest pain EXAM: PORTABLE CHEST 1 VIEW COMPARISON:  CTA chest 11/13/2022. FINDINGS: There is mild cardiomegaly without overt CHF findings. The mediastinum is stable. Both lungs are clear. The visualized skeletal structures are unremarkable. IMPRESSION: Mild cardiomegaly without overt CHF findings.  Both lungs are clear. Electronically Signed   By: Telford Nab M.D.   On: 11/20/2022 06:27    Labs:  CBC: Recent Labs    11/20/22 0536 11/21/22 0435 11/23/22 1355 12/07/22 0656  WBC 11.3* 10.8* 11.1* 8.4  HGB 14.7 13.8 13.5 13.3  HCT 44.7 41.8 41.0 39.7  PLT 313 284 299 281    COAGS: Recent Labs    02/26/22 1530 12/07/22 0656  INR 1.0 1.1  APTT 35  --     BMP: Recent Labs    06/26/22 1729 11/13/22 0645 11/20/22 0536 11/21/22 0435 11/23/22 1355  NA 137 137 136 134* 140  K 4.0 3.6 4.3 4.1 4.4  CL 104 104 104 103 106  CO2 22 23 23 24  20   GLUCOSE 89 142* 93 93 146*  BUN 27* 15 29* 32* 25*  CALCIUM 9.7 8.7* 8.9 9.0 8.8  CREATININE 1.92* 1.46* 1.61* 1.73* 1.96*  GFRNONAA 44* >60 54* 50*  --     LIVER FUNCTION TESTS: Recent Labs    05/25/22 2205 06/26/22 1729 11/13/22 0919 11/20/22 0536  BILITOT 0.5 0.6 0.6 0.5  AST 20 14* 14* 16  ALT 32 23 16 18   ALKPHOS 75 79 70 68  PROT 7.4 8.2* 7.0 7.8  ALBUMIN 4.5 4.8 3.7 4.1    TUMOR MARKERS: No results for input(s): "AFPTM", "CEA", "CA199", "CHROMGRNA" in the last 8760 hours.  Assessment and Plan  42 year old male with biopsy-proven papillary renal cell carcinoma in the right kidney interpolar region.  Patient has additional indeterminate renal lesions including a 3 cm lesion along the right kidney upper pole that appears to contain blood products.  There are also small indeterminate lesions in the left kidney.  We discussed management of the right renal cell carcinoma.  Patient has chronic kidney disease and his last creatinine was 1.96.  Based on the patient's morbid obesity, hypertension and chronic kidney disease, percutaneous ablation probably offers the most nephron sparing option to treat the right renal cell carcinoma.  Percutaneous biopsy of the right renal lesion was technically difficult due to the patient's body habitus and the adjacent colon.  I feel that percutaneous cryoablation of this lesion is technically feasible using ultrasound and CT guidance and hydrodissection to move the colon out of the way.  We discussed the percutaneous ablation procedure in depth.  Patient will require general anesthesia for this procedure.  We will reach out to patient's cardiologist to get clearance for general anesthesia.  We discussed the benefits of percutaneous ablation including the minimally invasive and nephron sparing nature of the procedure.  We discussed the risks of the procedure which include potential kidney damage, damage to adjacent structures and incomplete treatment.   Patient understands that he will require surveillance of the treated renal lesion and other renal lesions.  We also discussed the possibility of biopsying the right kidney upper pole lesion at the same time at the ablation.  Based on patient's body habitus and location of the upper pole lesion, it will be technically difficult to do both of these procedures at the same time and would prefer to continue with close surveillance of the right kidney upper pole lesion.  Patient would like to proceed with percutaneous cryoablation of the right renal lesion.  Plan for cardiac clearance and scheduling of the procedure.  Electronically Signed: Burman Riis 12/14/2022, 3:42 PM   I spent a total of    25 Minutes in face to face in clinical consultation, greater than 50% of which was counseling/coordinating care for renal cell carcinoma. Patient ID: Calvin Bailey, male   DOB: 02/24/1981, 42 y.o.   MRN: [id number]

## 2022-12-15 ENCOUNTER — Telehealth: Payer: Self-pay | Admitting: Cardiology

## 2022-12-15 NOTE — Telephone Encounter (Signed)
   Name: NADIR KENASTON  DOB: 1981-09-24  MRN: [id number]  Primary Cardiologist: Berniece Salines, DO  Chart reviewed as part of pre-operative protocol coverage. Because of Kamaree Finamore Couvillon's past medical history and time since last visit, he will require a follow-up in-office visit in order to better assess preoperative cardiovascular risk. Patient NS office visit 11/26/2022 for hospital follow-up.  Pre-op covering staff: - Please schedule appointment and call patient to inform them. If patient already had an upcoming appointment within acceptable timeframe, please add "pre-op clearance" to the appointment notes so provider is aware. - Please contact requesting surgeon's office via preferred method (i.e, phone, fax) to inform them of need for appointment prior to surgery.   Mayra Reel, NP  12/15/2022, 9:33 AM

## 2022-12-15 NOTE — Telephone Encounter (Signed)
   Pre-operative Risk Assessment    Patient Name: Calvin Bailey  DOB: 12/10/1980 MRN: [id number]      Request for Surgical Clearance    Procedure:   Renal Cryoablation   Date of Surgery:  Clearance TBD                                 Surgeon:  Dr. Markus Daft Surgeon's Group or Practice Name:  [facility]  Phone number:  [phone] Fax number:  [phone]   Type of Clearance Requested:   - Medical    Type of Anesthesia:  General    Additional requests/questions:    Signed, April Henson   12/15/2022, 9:12 AM

## 2022-12-15 NOTE — Telephone Encounter (Signed)
Patient scheduled for in office visit with Coletta Memos PA-C on 12/29/22

## 2022-12-16 ENCOUNTER — Other Ambulatory Visit: Payer: Self-pay | Admitting: Neurology

## 2022-12-22 ENCOUNTER — Ambulatory Visit (HOSPITAL_BASED_OUTPATIENT_CLINIC_OR_DEPARTMENT_OTHER): Payer: 59 | Admitting: Family Medicine

## 2022-12-23 ENCOUNTER — Other Ambulatory Visit: Payer: Self-pay | Admitting: Cardiology

## 2022-12-27 NOTE — Progress Notes (Deleted)
Cardiology Clinic Note   Patient Name: Calvin Bailey Date of Encounter: 12/27/2022  Primary Care Provider:  [place], Blondell Reveal, MD Primary Cardiologist:  Berniece Salines, DO  Patient Profile    Calvin Bailey 42 year old male presents the clinic today for follow-up evaluation of his essential hypertension.  Past Medical History    Past Medical History:  Diagnosis Date   Dizziness 09/25/2019   GERD (gastroesophageal reflux disease)    HTN (hypertension)    Obesity hypoventilation syndrome (HCC) 04/08/2014   OSA (obstructive sleep apnea) 04/08/2014   PFO (patent foramen ovale)    Sleep apnea    Past Surgical History:  Procedure Laterality Date   two knee surgeries Left 1995    Allergies  Allergies  Allergen Reactions   Chlorthalidone Nausea Only   Lisinopril Other (See Comments)    Coughing up blood    History of Present Illness    Calvin Bailey is a PMH of chest discomfort, hypertension, morbid obesity, GERD, LVH, chronic diastolic CHF, expressive aphasia, OSA and CKD stage III.  His PMH also includes right renal mass.  He presented to the emergency department on 11/20/2022 and was discharged on 11/21/2022.  He noted chest discomfort while he was at work.  He reported that his discomfort had been intermittent for 1 week.  He was noted to have blood pressures in the [id number] systolic over 0000000 diastolic.  His troponins were noted to be negative.  His creatinine was elevated.  Chest x-ray at that time showed cardiomegaly.  Ischemic evaluation was recommended.  He followed up with vascular and interventional radiology for evaluation of his right renal mass.  He underwent right renal biopsy and right renal cryoablation was recommended.  He presents to the clinic today for follow-up evaluation and preoperative cardiac evaluation.  He states***.  *** denies chest pain, shortness of breath, lower extremity edema, fatigue, palpitations, melena, hematuria, hemoptysis,  diaphoresis, weakness, presyncope, syncope, orthopnea, and PND.  Home Medications    Prior to Admission medications   Medication Sig Start Date End Date Taking? Authorizing Provider  acetaminophen (TYLENOL) 500 MG tablet Take 1 tablet (500 mg total) by mouth every 6 (six) hours as needed. Patient taking differently: Take 1,000 mg by mouth every 6 (six) hours as needed for mild pain. 11/13/22   Charlesetta Shanks, MD  amLODipine (NORVASC) 10 MG tablet TAKE 1 TABLET(10 MG) BY MOUTH DAILY 12/23/22   Tobb, Kardie, DO  colchicine 0.6 MG tablet Take 1 tablet (0.6 mg total) by mouth 2 (two) times daily. 12/08/22   Les Pou, FNP  cyclobenzaprine (FLEXERIL) 10 MG tablet Take 10 mg by mouth at bedtime. 11/21/22   [provider]  gabapentin (NEURONTIN) 100 MG capsule Take 100 mg by mouth at bedtime. 11/21/22   [provider]  hydrALAZINE (APRESOLINE) 100 MG tablet Take 1 tablet (100 mg total) by mouth 3 (three) times daily. Patient taking differently: Take 100 mg by mouth 2 (two) times daily. 08/24/22   Skeet Latch, MD  isosorbide mononitrate (IMDUR) 30 MG 24 hr tablet Take 1 tablet (30 mg total) by mouth daily. 11/22/22 02/20/23  Pokhrel, Corrie Mckusick, MD  ketorolac (TORADOL) 10 MG tablet Take 10 mg by mouth 3 (three) times daily. 11/18/22   [provider]  labetalol (NORMODYNE) 300 MG tablet Take 1 tablet (300 mg total) by mouth 3 (three) times daily. 11/21/22   Pokhrel, Corrie Mckusick, MD  omeprazole (PRILOSEC) 40 MG capsule Take 1 capsule by mouth daily.  11/22/22   [provider]  pantoprazole (PROTONIX) 40 MG tablet Take 1 tablet (40 mg total) by mouth 2 (two) times daily before a meal for 14 days, THEN 1 tablet (40 mg total) daily. 11/21/22 01/04/23  Pokhrel, Corrie Mckusick, MD  Semaglutide-Weight Management (WEGOVY) 1 MG/0.5ML SOAJ Inject 1 mg into the skin every 7 (seven) days. 08/24/22   Skeet Latch, MD  spironolactone (ALDACTONE) 25 MG tablet TAKE 1 TABLET(25 MG) BY MOUTH  DAILY 12/23/22   Tobb, Kardie, DO  topiramate (TOPAMAX) 50 MG tablet Take 50 mg by mouth 2 (two) times daily. 11/21/22   [provider]  traMADol (ULTRAM) 50 MG tablet 1 to 2 tablets every 6 hours with extra strength Tylenol for pain as needed Patient taking differently: Take 50-100 mg by mouth every 6 (six) hours as needed for moderate pain. Can take with extra strength Tylenol for pain as needed 11/13/22   Charlesetta Shanks, MD  valsartan (DIOVAN) 160 MG tablet Take 1 tablet (160 mg total) by mouth 2 (two) times daily. 07/01/22   Loel Dubonnet, NP  LISINOPRIL-HYDROCHLOROTHIAZIDE PO Take by mouth.  07/06/19  [provider]    Family History    Family History  Problem Relation Age of Onset   Hypertension Mother    Multiple sclerosis Mother    Hypertension Father    Hypertension Brother    Hyperlipidemia Paternal Grandfather    Heart failure Paternal Grandfather    Diabetes Maternal Grandmother    Heart failure Maternal Grandfather    CVA Maternal Uncle    He indicated that his mother is alive. He indicated that his father is alive. He indicated that the status of his brother is unknown. He indicated that the status of his maternal grandmother is unknown. He indicated that the status of his maternal grandfather is unknown. He indicated that the status of his paternal grandfather is unknown. He indicated that the status of his maternal uncle is unknown.  Social History    Social History   Socioeconomic History   Marital status: Single    Spouse name: Not on file   Number of children: 2   Years of education: college   Highest education level: Some college, no degree  Occupational History   Occupation: unemployment    Fish farm manager: UNEMPLOYED    Employer: [employer]  Tobacco Use   Smoking status: Former    Packs/day: 0.10    Years: 16.00    Additional pack years: 0.00    Total pack years: 1.60    Types: Cigarettes    Quit date: 06/08/2022    Years since  quitting: 0.5   Smokeless tobacco: Never  Vaping Use   Vaping Use: Never used  Substance and Sexual Activity   Alcohol use: Not Currently    Comment: sparingly    Drug use: No   Sexual activity: Yes    Partners: Female  Other Topics Concern   Not on file  Social History Narrative   Nature conservation officer   Patient is single and lives alone.   Patient is left-handed.   Patient does not drink any caffeine.   Social Determinants of Health   Financial Resource Strain: Low Risk  (07/01/2022)   Overall Financial Resource Strain (CARDIA)    Difficulty of Paying Living Expenses: Not hard at all  Food Insecurity: No Food Insecurity (11/20/2022)   Hunger Vital Sign    Worried About Running Out of Food in the Last Year: Never true  Ran Out of Food in the Last Year: Never true  Transportation Needs: No Transportation Needs (11/20/2022)   PRAPARE - Hydrologist (Medical): No    Lack of Transportation (Non-Medical): No  Physical Activity: Insufficiently Active (07/01/2022)   Exercise Vital Sign    Days of Exercise per Week: 3 days    Minutes of Exercise per Session: 30 min  Stress: No Stress Concern Present (07/01/2022)   [place]    Feeling of Stress : Only a little  Social Connections: Socially Isolated (07/01/2022)   Social Connection and Isolation Panel [NHANES]    Frequency of Communication with Friends and Family: Once a week    Frequency of Social Gatherings with Friends and Family: Once a week    Attends Religious Services: 1 to 4 times per year    Active Member of [employer] or Organizations: No    Attends Archivist Meetings: Never    Marital Status: Never married  Intimate Partner Violence: Not At Risk (11/20/2022)   Humiliation, Afraid, Rape, and Kick questionnaire    Fear of Current or Ex-Partner: No    Emotionally Abused: No    Physically Abused: No    Sexually Abused: No      Review of Systems    General:  No chills, fever, night sweats or weight changes.  Cardiovascular:  No chest pain, dyspnea on exertion, edema, orthopnea, palpitations, paroxysmal nocturnal dyspnea. Dermatological: No rash, lesions/masses Respiratory: No cough, dyspnea Urologic: No hematuria, dysuria Abdominal:   No nausea, vomiting, diarrhea, bright red blood per rectum, melena, or hematemesis Neurologic:  No visual changes, wkns, changes in mental status. All other systems reviewed and are otherwise negative except as noted above.  Physical Exam    VS:  There were no vitals taken for this visit. , BMI There is no height or weight on file to calculate BMI. GEN: Well nourished, well developed, in no acute distress. HEENT: normal. Neck: Supple, no JVD, carotid bruits, or masses. Cardiac: RRR, no murmurs, rubs, or gallops. No clubbing, cyanosis, edema.  Radials/DP/PT 2+ and equal bilaterally.  Respiratory:  Respirations regular and unlabored, clear to auscultation bilaterally. GI: Soft, nontender, nondistended, BS + x 4. MS: no deformity or atrophy. Skin: warm and dry, no rash. Neuro:  Strength and sensation are intact. Psych: Normal affect.  Accessory Clinical Findings    Recent Labs: 11/20/2022: ALT 18; B Natriuretic Peptide 46.5 11/23/2022: BUN 25; Creatinine, Ser 1.96; Magnesium 2.7; Potassium 4.4; Sodium 140 12/07/2022: Hemoglobin 13.3; Platelets 281   Recent Lipid Panel    Component Value Date/Time   CHOL 136 02/27/2022 0756   CHOL 149 05/30/2019 1500   TRIG 74 02/27/2022 0756   HDL 28 (L) 02/27/2022 0756   HDL 34 (L) 05/30/2019 1500   CHOLHDL 4.9 02/27/2022 0756   VLDL 15 02/27/2022 0756   LDLCALC 93 02/27/2022 0756   LDLCALC 93 05/30/2019 1500    No BP recorded.  {Refresh Note OR Click here to enter BP  :1}***    ECG personally reviewed by me today- *** - No acute changes  Echocardiogram 11/20/2022  IMPRESSIONS     1. Left ventricular ejection fraction, by  estimation, is 60 to 65%. The  left ventricle has normal function. The left ventricle has no regional  wall motion abnormalities. There is mild concentric left ventricular  hypertrophy. Left ventricular diastolic  parameters were normal.   2. Right ventricular systolic function  is normal. The right ventricular  size is normal. There is normal pulmonary artery systolic pressure.   3. The mitral valve is normal in structure. Trivial mitral valve  regurgitation. No evidence of mitral stenosis.   4. The aortic valve is tricuspid. Aortic valve regurgitation is mild. No  aortic stenosis is present.   5. The inferior vena cava is normal in size with greater than 50%  respiratory variability, suggesting right atrial pressure of 3 mmHg.   Comparison(s): No significant change from prior study.   FINDINGS   Left Ventricle: Left ventricular ejection fraction, by estimation, is 60  to 65%. The left ventricle has normal function. The left ventricle has no  regional wall motion abnormalities. Definity contrast agent was given IV  to delineate the left ventricular   endocardial borders. The left ventricular internal cavity size was normal  in size. There is mild concentric left ventricular hypertrophy. Left  ventricular diastolic parameters were normal.   Right Ventricle: The right ventricular size is normal. No increase in  right ventricular wall thickness. Right ventricular systolic function is  normal. There is normal pulmonary artery systolic pressure. The tricuspid  regurgitant velocity is 2.24 m/s, and   with an assumed right atrial pressure of 3 mmHg, the estimated right  ventricular systolic pressure is [id number] mmHg.   Left Atrium: Left atrial size was normal in size.   Right Atrium: Right atrial size was normal in size.   Pericardium: There is no evidence of pericardial effusion.   Mitral Valve: The mitral valve is normal in structure. Trivial mitral  valve regurgitation. No evidence of  mitral valve stenosis.   Tricuspid Valve: The tricuspid valve is normal in structure. Tricuspid  valve regurgitation is trivial.   Aortic Valve: The aortic valve is tricuspid. Aortic valve regurgitation is  mild. Aortic regurgitation PHT measures 572 msec. No aortic stenosis is  present. Aortic valve mean gradient measures 6.0 mmHg. Aortic valve peak  gradient measures 10.4 mmHg.  Aortic valve area, by VTI measures 2.98 cm.   Pulmonic Valve: The pulmonic valve was normal in structure. Pulmonic valve  regurgitation is trivial.   Aorta: The aortic root is normal in size and structure.   Venous: The inferior vena cava is normal in size with greater than 50%  respiratory variability, suggesting right atrial pressure of 3 mmHg.   IAS/Shunts: The atrial septum is grossly normal.   Assessment & Plan   1.  Essential hypertension-BP today***. Continue current medical therapy Heart healthy low-sodium diet-salty 6 given Increase physical activity as tolerated Maintain blood pressure log  Chest discomfort-no chest pain today.  Denies recent episodes of chest discomfort.  Admitted 2/24 with chest discomfort in the setting of hypertensive emergency.  Troponins were noted to be 13 and then 12.  Coronary CTA recommended at that time.  Creatinine was noted to be 1.96 with a baseline of around 1.61. Order nuclear stress test Order CBC, BMP  OSA-reports compliance with CPAP. Continue CPAP use Weight loss  CKD stage III-creatinine 1.96 on 11/23/2022.  Avoid nephrotoxic agents Maintain good blood pressure control Follows with PCP   Preoperative cardiac evaluation-renal cryoablation Primary Cardiologist: Berniece Salines, DO  Chart reviewed as part of pre-operative protocol coverage.  Patient was admitted on 11/20/2022 until 11/21/2022.  Coronary CTA was recommended at that time but was not completed.  Order nuclear stress test.  Pending results of ischemic evaluation we will be able to make  recommendations for upcoming surgery.  Disposition: Follow-up with  Dr. Harriet Masson or me in 6 months.     Jossie Ng. Tura Roller NP-C     12/27/2022, 5:14 PM [place] [address] Office [phone] Fax [phone]    I spent***minutes examining this patient, reviewing medications, and using patient centered shared decision making involving her cardiac care.  Prior to her visit I spent greater than 20 minutes reviewing her past medical history,  medications, and prior cardiac tests.

## 2022-12-28 ENCOUNTER — Other Ambulatory Visit (HOSPITAL_COMMUNITY): Payer: Self-pay | Admitting: Diagnostic Radiology

## 2022-12-28 DIAGNOSIS — N2889 Other specified disorders of kidney and ureter: Secondary | ICD-10-CM

## 2022-12-29 ENCOUNTER — Ambulatory Visit: Payer: 59 | Admitting: General Practice

## 2022-12-29 ENCOUNTER — Ambulatory Visit (HOSPITAL_COMMUNITY)
Admission: EM | Admit: 2022-12-29 | Discharge: 2022-12-29 | Disposition: A | Discharge status: Home / Self Care | Payer: 59 | Attending: Emergency Medicine | Admitting: Emergency Medicine

## 2022-12-29 ENCOUNTER — Encounter (HOSPITAL_COMMUNITY): Payer: Self-pay

## 2022-12-29 DIAGNOSIS — R519 Headache, unspecified: Secondary | ICD-10-CM

## 2022-12-29 MED ORDER — ACETAMINOPHEN 325 MG PO TABS
975.0000 mg | ORAL_TABLET | Freq: Once | ORAL | Status: AC
  Administered 2022-12-29: 975 mg via ORAL

## 2022-12-29 MED ORDER — ACETAMINOPHEN 325 MG PO TABS
ORAL_TABLET | ORAL | Status: AC
  Filled 2022-12-29: qty 3

## 2022-12-29 NOTE — Discharge Instructions (Addendum)
We have given you a dose of Tylenol today in clinic for your headache.  I believe that your headache is either a bad headache or due to your recent medication changes.  Please follow-up with your primary care if the next few days to ensure headache improvement.  You can take Excedrin Migraine as well, sometimes rest and caffeine can help a bad headache.  Please continue to take your blood pressure medication as scheduled.  Please seek immediate care if you develop loss of vision, thunder clap headache, or any new concerning symptoms.

## 2022-12-29 NOTE — ED Provider Notes (Signed)
[facility]    CSN: [id number] Arrival date & time: 12/29/22  1839      History   Chief Complaint Chief Complaint  Patient presents with   Headache   Mass    HPI Calvin Bailey is a 42 y.o. male.   Patient presents to clinic for right-sided temporal headache that has been present for the past 2 days.  Has had tried Tylenol and Excedrin Migraine with some minor relief, however, the headache returned shortly after.  Reports he is sensitive to light, denies any nausea, vomiting, vision changes or loss of consciousness.  Blood pressure is been maintaining in the 140s to the 150s over 80s at home. Recently started Imdur about 1.5 weeks ago and was notified that he would probably have headaches with this medication.  He is concerned because he feels that the right temporal area is swollen.  He has surgery upcoming soon for a renal mass and he is worried that he has either a blood clot or the cancer has spread. Denies hx of blood clots, denies CP or SOB. Denies new LE edema, calf pain or hematemesis.   The history is provided by the patient and medical records.  Headache Associated symptoms: photophobia   Associated symptoms: no abdominal pain, no back pain, no cough, no ear pain, no eye pain, no fever, no seizures, no sore throat and no vomiting     Past Medical History:  Diagnosis Date   Dizziness 09/25/2019   GERD (gastroesophageal reflux disease)    HTN (hypertension)    Obesity hypoventilation syndrome 04/08/2014   OSA (obstructive sleep apnea) 04/08/2014   PFO (patent foramen ovale)    Sleep apnea     Patient Active Problem List   Diagnosis Date Noted   Acute gout involving toe of right foot 12/08/2022   Hospital discharge follow-up 11/23/2022   Hypertensive urgency 11/20/2022   Chest pain 11/20/2022   Renal mass 07/01/2022   Wellness examination 05/24/2022   Intractable migraine without aura and with status migrainosus 03/31/2022   CPAP (continuous  positive airway pressure) dependence 03/31/2022   CKD stage G4/A1, GFR 15-29 and albumin creatinine ratio <30 mg/g 03/31/2022   Primary hypertension 03/31/2022   Chronic headaches 03/22/2022   Chronic kidney disease, stage 3a 02/27/2022   Expressive aphasia 02/27/2022   Occipital headache 02/27/2022   Chronic diastolic CHF (congestive heart failure) 02/27/2022   Morbid obesity 02/27/2022   GERD (gastroesophageal reflux disease)    HTN (hypertension)    Acute respiratory failure due to COVID-19 04/10/2020   ARF (acute renal failure) 04/10/2020   Dizziness 09/25/2019   Green nails 07/31/2019   Dyspnea on exertion 05/30/2019   3+ pitting edema 05/30/2019   Tinea versicolor 01/04/2019   Tinea corporis 01/04/2019   LVH (left ventricular hypertrophy) due to hypertensive disease 10/07/2017   Chronic cough 06/11/2017   Abnormal CT of the chest 06/11/2017   Herpes simplex infection of penis 02/15/2017   Hypogonadism male 07/12/2014   Erectile dysfunction due to arterial insufficiency 07/12/2014   Sleep apnea 06/21/2014   Obesity hypoventilation syndrome 04/08/2014   Morbid obesity due to excess calories 04/08/2014   OSA (obstructive sleep apnea) 04/08/2014   Nicotine dependence, cigarettes, uncomplicated [id number]   Tobacco use disorder 02/23/2011   Gastroesophageal reflux disease without esophagitis 02/09/2011   Essential hypertension 07/21/2009    Past Surgical History:  Procedure Laterality Date   two knee surgeries Left 1995       Home Medications  Prior to Admission medications   Medication Sig Start Date End Date Taking? Authorizing Provider  acetaminophen (TYLENOL) 500 MG tablet Take 1 tablet (500 mg total) by mouth every 6 (six) hours as needed. Patient taking differently: Take 1,000 mg by mouth every 6 (six) hours as needed for mild pain. 11/13/22  Yes Pfeiffer, Jeannie Done, MD  amLODipine (NORVASC) 10 MG tablet TAKE 1 TABLET(10 MG) BY MOUTH DAILY 12/23/22  Yes Tobb,  Kardie, DO  colchicine 0.6 MG tablet Take 1 tablet (0.6 mg total) by mouth 2 (two) times daily. 12/08/22  Yes Les Pou, FNP  hydrALAZINE (APRESOLINE) 100 MG tablet Take 1 tablet (100 mg total) by mouth 3 (three) times daily. Patient taking differently: Take 100 mg by mouth 2 (two) times daily. 08/24/22  Yes Skeet Latch, MD  isosorbide mononitrate (IMDUR) 30 MG 24 hr tablet Take 1 tablet (30 mg total) by mouth daily. 11/22/22 02/20/23 Yes Pokhrel, Laxman, MD  labetalol (NORMODYNE) 300 MG tablet Take 1 tablet (300 mg total) by mouth 3 (three) times daily. 11/21/22  Yes Pokhrel, Laxman, MD  spironolactone (ALDACTONE) 25 MG tablet TAKE 1 TABLET(25 MG) BY MOUTH DAILY 12/23/22  Yes Tobb, Kardie, DO  valsartan (DIOVAN) 160 MG tablet Take 1 tablet (160 mg total) by mouth 2 (two) times daily. 07/01/22  Yes Loel Dubonnet, NP  LISINOPRIL-HYDROCHLOROTHIAZIDE PO Take by mouth.  07/06/19  [provider]    Family History Family History  Problem Relation Age of Onset   Hypertension Mother    Multiple sclerosis Mother    Hypertension Father    Hypertension Brother    Hyperlipidemia Paternal Grandfather    Heart failure Paternal Grandfather    Diabetes Maternal Grandmother    Heart failure Maternal Grandfather    CVA Maternal Uncle     Social History Social History   Tobacco Use   Smoking status: Every Day    Packs/day: 0.10    Years: 16.00    Additional pack years: 0.00    Total pack years: 1.60    Types: Cigarettes    Last attempt to quit: 06/08/2022    Years since quitting: 0.5   Smokeless tobacco: Never  Vaping Use   Vaping Use: Never used  Substance Use Topics   Alcohol use: Not Currently    Comment: sparingly    Drug use: No     Allergies   Chlorthalidone and Lisinopril   Review of Systems Review of Systems  Constitutional:  Negative for chills and fever.  HENT:  Negative for ear pain and sore throat.   Eyes:  Positive for photophobia. Negative for pain  and visual disturbance.  Respiratory:  Negative for cough and shortness of breath.   Cardiovascular:  Negative for chest pain and palpitations.  Gastrointestinal:  Negative for abdominal pain and vomiting.  Genitourinary:  Negative for dysuria and hematuria.  Musculoskeletal:  Negative for arthralgias and back pain.  Skin:  Negative for color change and rash.  Neurological:  Positive for headaches. Negative for seizures and syncope.  All other systems reviewed and are negative.    Physical Exam Triage Vital Signs ED Triage Vitals  Enc Vitals Group     BP 12/29/22 1922 (!) 147/83     Pulse Rate 12/29/22 1922 67     Resp 12/29/22 1922 18     Temp 12/29/22 1922 98.5 F (36.9 C)     Temp Source 12/29/22 1922 Oral     SpO2 12/29/22 1922 99 %  Weight 12/29/22 1922 (!) 360 lb (163.3 kg)     Height 12/29/22 1922 6\' 1"  (1.854 m)     Head Circumference --      Peak Flow --      Pain Score 12/29/22 1920 7     Pain Loc --      Pain Edu? --      Excl. in [place]? --    No data found.  Updated Vital Signs BP (!) 147/83 (BP Location: Left Arm)   Pulse 67   Temp 98.5 F (36.9 C) (Oral)   Resp 18   Ht 6\' 1"  (1.854 m)   Wt (!) 360 lb (163.3 kg)   SpO2 99%   BMI 47.50 kg/m   Visual Acuity Right Eye Distance:   Left Eye Distance:   Bilateral Distance:    Right Eye Near:   Left Eye Near:    Bilateral Near:     Physical Exam Vitals and nursing note reviewed.  Constitutional:      General: He is not in acute distress.    Appearance: He is well-developed.  HENT:     Head: Normocephalic and atraumatic.     Mouth/Throat:     Mouth: Mucous membranes are moist.     Pharynx: Oropharynx is clear.  Eyes:     General: No visual field deficit or scleral icterus.    Extraocular Movements: Extraocular movements intact.     Right eye: Normal extraocular motion and no nystagmus.     Left eye: Normal extraocular motion and no nystagmus.     Conjunctiva/sclera: Conjunctivae normal.      Pupils: Pupils are equal, round, and reactive to light. Pupils are equal.  Cardiovascular:     Rate and Rhythm: Normal rate and regular rhythm.     Heart sounds: Normal heart sounds. No murmur heard. Pulmonary:     Effort: Pulmonary effort is normal. No respiratory distress.     Breath sounds: Normal breath sounds.  Musculoskeletal:        General: Normal range of motion.     Cervical back: Normal range of motion and neck supple. No rigidity.  Lymphadenopathy:     Cervical: No cervical adenopathy.  Skin:    General: Skin is warm and dry.     Capillary Refill: Capillary refill takes less than 2 seconds.  Neurological:     Mental Status: He is alert and oriented to person, place, and time.     GCS: GCS eye subscore is 4. GCS verbal subscore is 5. GCS motor subscore is 6.     Cranial Nerves: No cranial nerve deficit, dysarthria or facial asymmetry.     Sensory: No sensory deficit.     Motor: No weakness.  Psychiatric:        Mood and Affect: Mood normal.        Behavior: Behavior normal.      UC Treatments / Results  Labs (all labs ordered are listed, but only abnormal results are displayed) Labs Reviewed - No data to display  EKG   Radiology No results found.  Procedures Procedures (including critical care time)  Medications Ordered in UC Medications  acetaminophen (TYLENOL) tablet 975 mg (975 mg Oral Given 12/29/22 2026)    Initial Impression / Assessment and Plan / UC Course  I have reviewed the triage vital signs and the nursing notes.  Pertinent labs & imaging results that were available during my care of the patient were reviewed by me and considered  in my medical decision making (see chart for details).  Vitals in triage reviewed, patient is hemodynamically stable.  Cranial nerves II through XII grossly intact, GCS 15.  Coordination intact, strength 5 out of 5 in upper and lower extremities.  Low concern for acute neurological abnormalities warranting emergent  evaluation.  Patient concerned for swelling/mass to his right temporal area, however, no mass or swelling visualized on physical exam.  Area is tender to palpation, suspect bad headache.  Unable to tolerate NSAIDs due to chronic kidney disease.  Will trial Tylenol in clinic and advised follow-up with primary care for further evaluation over the next few days if no improvement.  Discussed red flag symptoms that warrant emergent evaluation, patient verbalized understanding, no questions at this time.     Final Clinical Impressions(s) / UC Diagnoses   Final diagnoses:  Bad headache     Discharge Instructions      We have given you a dose of Tylenol today in clinic for your headache.  I believe that your headache is either a bad headache or due to your recent medication changes.  Please follow-up with your primary care if the next few days to ensure headache improvement.  You can take Excedrin Migraine as well, sometimes rest and caffeine can help a bad headache.  Please continue to take your blood pressure medication as scheduled.  Please seek immediate care if you develop loss of vision, thunder clap headache, or any new concerning symptoms.      ED Prescriptions   None    PDMP not reviewed this encounter.   Miamor Ayler, [place], [place] 12/29/22 2029

## 2022-12-29 NOTE — ED Triage Notes (Signed)
Headache ongoing for 2 days. No history of migraines. No dietary changes. Taking a new med for BP.  Mass behind the right ear, noticed 3 pm today. Painful but no drainage.   No recent falls or injuries.

## 2023-01-04 ENCOUNTER — Ambulatory Visit: Payer: 59 | Attending: General Practice | Admitting: Cardiology

## 2023-01-04 ENCOUNTER — Encounter: Payer: Self-pay | Admitting: Cardiology

## 2023-01-04 VITALS — BP 168/94 | HR 65 | Ht 73.0 in | Wt 372.6 lb

## 2023-01-04 DIAGNOSIS — G4733 Obstructive sleep apnea (adult) (pediatric): Secondary | ICD-10-CM

## 2023-01-04 DIAGNOSIS — N1831 Chronic kidney disease, stage 3a: Secondary | ICD-10-CM

## 2023-01-04 DIAGNOSIS — I10 Essential (primary) hypertension: Secondary | ICD-10-CM | POA: Diagnosis not present

## 2023-01-04 DIAGNOSIS — I5032 Chronic diastolic (congestive) heart failure: Secondary | ICD-10-CM | POA: Diagnosis not present

## 2023-01-04 DIAGNOSIS — R079 Chest pain, unspecified: Secondary | ICD-10-CM

## 2023-01-04 DIAGNOSIS — E662 Morbid (severe) obesity with alveolar hypoventilation: Secondary | ICD-10-CM

## 2023-01-04 DIAGNOSIS — Z01818 Encounter for other preprocedural examination: Secondary | ICD-10-CM | POA: Diagnosis not present

## 2023-01-04 DIAGNOSIS — F17200 Nicotine dependence, unspecified, uncomplicated: Secondary | ICD-10-CM

## 2023-01-04 NOTE — Progress Notes (Signed)
Cardiology Office Note:   Date:  01/04/2023  ID:  Calvin Bailey, DOB 02-25-1981, MRN [id number]  History of Present Illness:   Calvin Bailey is a 42 y.o. male with a history of HTN, HLD, OSA, CKD, morbid obesity on Wegovy, tobacco use, recently admitted for chest pain and hypertensive emergency 11/20/22, who is here today for follow and needing peroperative clearance after recently being diagnosed with papillary renal cell carcinoma.  Diagnosed with hypertension in his 61s.  Has been difficult to control with previous difficulties with compliance.  01/2022 CT angio head and neck with no significant carotid or vertebral stenosis.  Previously been following with hypertensive clinic as well as seeing the Pharm D team.   He presented to [facility] on 11/20/2022 with complaints of chest pain and hypertensive emergency.  She was feeling persistent chest pain in the right side of his chest and working while he was doing his work.  The chest pain has been-hypertensive urgency pain is his chest and headaches also with radiation to his jaw.  He had chest pain on 11/13/2022 with no PE on CT PE.  Blood pressure was 172/109 patient states he has been out of his labetalol for a day but was compliant with his other medications.  Exam revealed 2+ pitting edema.  Pertinent labs for BUN of 29 serum creatinine 1.61 with a GFR 54, and high-sensitivity troponin of 12.  EKG without ischemic changes.  Started on Imdur with echocardiogram that was ordered. For his atypical chest pain it was decided he would need follow-up coronary CTA as an outpatient.  On 01/25/2023 he was evaluated at urgent care for complaints of headache for 2 days.  He was also concerned because he felt like the right temple area was swollen.  He has surgery upcoming soon for renal mass and was worried and stated a blood clot or cancer that has spread.  Blood pressure was much improved at 147/83.  He was treated with Tylenol for his headache.  He  was subsequently discharged home with no further changes to his medication.  He returns to clinic today he states overall he has been doing fairly well.  Unfortunately today he does have an abscessed tooth and is recently been on amoxicillin and was given 800 mg of ibuprofen by the dentist.  Blood pressure continues to be elevated today but he states he has been running in the 140s at home on his current medication regimen.  He states he has been compliant with his medication management and has not missed any doses.  He is scheduled for surgery this upcoming Friday.  Previously in February he was hospitalized with chest pain and hypertensive emergency with his ischemic workup deferred to the outpatient setting.  Unfortunately he is also not had any further ischemic workup.  He was just recently evaluated in urgent care as well for headache.  Denies any chest pain or worsening shortness of breath today.  Continues to have peripheral edema.  ROS: 10 point review of systems has been reviewed and is considered negative with the exception was been listed in the HPI  Studies Reviewed:    EKG: Sinus rhythm with a rate of 65 LVH nonspecific T wave  TTE 11/20/22 1. Left ventricular ejection fraction, by estimation, is 60 to 65%. The  left ventricle has normal function. The left ventricle has no regional  wall motion abnormalities. There is mild concentric left ventricular  hypertrophy. Left ventricular diastolic  parameters were normal.  2. Right ventricular systolic function is normal. The right ventricular  size is normal. There is normal pulmonary artery systolic pressure.   3. The mitral valve is normal in structure. Trivial mitral valve  regurgitation. No evidence of mitral stenosis.   4. The aortic valve is tricuspid. Aortic valve regurgitation is mild. No  aortic stenosis is present.   5. The inferior vena cava is normal in size with greater than 50%  respiratory variability, suggesting right  atrial pressure of 3 mmHg.   Risk Assessment/Calculations:     HYPERTENSION CONTROL Vitals:   01/04/23 1530 01/04/23 1615  BP: (!) 164/82 (!) 168/94    The patient's blood pressure is elevated above target today.  In order to address the patient's elevated BP: Blood pressure will be monitored at home to determine if medication changes need to be made.           Physical Exam:   VS:  BP (!) 168/94 (BP Location: Left Arm, Patient Position: Sitting, Cuff Size: Large)   Pulse 65   Ht 6\' 1"  (1.854 m)   Wt (!) 372 lb 9.6 oz (169 kg)   SpO2 93%   BMI 49.16 kg/m    Wt Readings from Last 3 Encounters:  01/04/23 (!) 372 lb 9.6 oz (169 kg)  12/29/22 (!) 360 lb (163.3 kg)  12/08/22 (!) 360 lb (163.3 kg)     GEN: Well nourished, well developed in no acute distress NECK: No JVD; No carotid bruits CARDIAC: RRR, no murmurs, rubs, gallops RESPIRATORY:  Clear to auscultation without rales, wheezing or rhonchi  ABDOMEN: Soft, non-tender, obese, non-distended EXTREMITIES: Trace pretibial edema; No deformity   ASSESSMENT AND PLAN:   Preoperative clearance request for procedure upcoming on Friday under general anesthesia.  Previously patient was admitted to the hospital for observation with complaints of chest pain and hypertensive emergency.  His cardiovascular workup was deferred to the outpatient setting.  There was discussion of left heart catheterization versus coronary CTA.  With his current elevated kidney function coronary CTA involves more dye than left heart catheterization.  At this time he has been scheduled for a Lexiscan Myoview to complete his ischemic workup from his previous hospitalization.  This will likely have to be done over 2 days.  Of note he also has a current abscessed tooth and has been advised they would likely not do surgical procedure with an active infection.  Hypertension with blood pressure 164/80 2 repeat was 168/94.  Patient states blood pressure 140s.  He has  been continued on amlodipine 10 mg daily milligrams 3 times a day, Imdur 30 mg daily, labetalol 300 mg 3 times daily, spironolactone 25 mg daily, and valsartan 160 mg twice daily.  Blood pressure is likely elevated today due to use of ibuprofen and pain from an abscessed tooth.  Previously workup revealed no evidence of renal artery stenosis.  Has been encouraged to continue to monitor his blood pressure at home.  CKD stage III with serum creatinine of 1.96 on 11/23/2022.  He was recently evaluated for suspicious renal lesion image guided biopsy of the right kidney interpolar region.  Biopsy demonstrated papillary renal cell carcinoma.  He has been advised to continue to avoid NSAIDs.  He had ibuprofen 800 mg with was advised not to take the ibuprofen but that she will take extra strength Tylenol.  Chronic diastolic congestive heart failure with an LVEF of 60 to 65%, mild concentric LVH, trivial mitral regurgitation.  He is continued on spironolactone  25 mg daily and labetalol 300 mg 3 times daily.  After his procedure is completed he would likely benefit from an SGLT2 inhibitor.  He has been encouraged to decrease his sodium intake, weigh daily, and watch his fluid intake as well.  Obstructive sleep apnea with obesity hypoventilation syndrome has been compliant with CPAP at bedtime  Morbid obesity with a BMI of 49.16.  Had previously been on Wegovy.  Would benefit from weight loss.  Has been encouraged to increase activity and decrease caloric intake.  Tobacco abuse says he is a current smoker.  Total cessation is recommended.  Disposition patient return to clinic to see MD/APP once testing is completed or sooner if needed.    Shared Decision Making/Informed Consent The risks [chest pain, shortness of breath, cardiac arrhythmias, dizziness, blood pressure fluctuations, myocardial infarction, stroke/transient ischemic attack, nausea, vomiting, allergic reaction, radiation exposure, metallic taste  sensation and life-threatening complications (estimated to be 1 in 10,000)], benefits (risk stratification, diagnosing coronary artery disease, treatment guidance) and alternatives of a nuclear stress test were discussed in detail with Calvin Bailey and he agrees to proceed.   Signed, Azani Brogdon, NP

## 2023-01-04 NOTE — Patient Instructions (Signed)
Medication Instructions:  Your physician recommends that you continue on your current medications as directed. Please refer to the Current Medication list given to you today.  *If you need a refill on your cardiac medications before your next appointment, please call your pharmacy*  Lab Work: -None ordered If you have labs (blood work) drawn today and your tests are completely normal, you will receive your results only by: MyChart Message (if you have MyChart) OR A paper copy in the mail If you have any lab test that is abnormal or we need to change your treatment, we will call you to review the results.  Testing/Procedures: Your provider has ordered a Lexiscan/ Exercise Myoview Stress test. This will take place at [facility]. Please report to the [facility] medical mall entrance. The volunteers at the first desk will direct you where to go.  [facility] MYOVIEW  Your provider has ordered a Stress Test with nuclear imaging. The purpose of this test is to evaluate the blood supply to your heart muscle. This procedure is referred to as a "Non-Invasive Stress Test." This is because other than having an IV started in your vein, nothing is inserted or "invades" your body. Cardiac stress tests are done to find areas of poor blood flow to the heart by determining the extent of coronary artery disease (CAD). Some patients exercise on a treadmill, which naturally increases the blood flow to your heart, while others who are unable to walk on a treadmill due to physical limitations will have a pharmacologic/chemical stress agent called Lexiscan . This medicine will mimic walking on a treadmill by temporarily increasing your coronary blood flow.   Please note: these test may take anywhere between 2-4 hours to complete  How to prepare for your Myoview test:  Nothing to eat for 6 hours prior to the test No caffeine for 24 hours prior to test No smoking 24 hours prior to test. Your medication may be taken with water.  If your  doctor stopped a medication because of this test, do not take that medication. Ladies, please do not wear dresses.  Skirts or pants are appropriate. Please wear a short sleeve shirt. No perfume, cologne or lotion. Wear comfortable walking shoes. No heels!   PLEASE NOTIFY THE OFFICE AT LEAST 24 HOURS IN ADVANCE IF YOU ARE UNABLE TO KEEP YOUR APPOINTMENT.  [phone] AND  PLEASE NOTIFY NUCLEAR MEDICINE AT [facility] AT LEAST 24 HOURS IN ADVANCE IF YOU ARE UNABLE TO KEEP YOUR APPOINTMENT. [phone]    Follow-Up: At [facility], you and your health needs are our priority.  As part of our continuing mission to provide you with exceptional heart care, we have created designated Provider Care Teams.  These Care Teams include your primary Cardiologist (physician) and Advanced Practice Providers (APPs -  Physician Assistants and Nurse Practitioners) who all work together to provide you with the care you need, when you need it.  We recommend signing up for the patient portal called "MyChart".  Sign up information is provided on this After Visit Summary.  MyChart is used to connect with patients for Virtual Visits (Telemedicine).  Patients are able to view lab/test results, encounter notes, upcoming appointments, etc.  Non-urgent messages can be sent to your provider as well.   To learn more about what you can do with MyChart, go to [email or web address].    Your next appointment:   After Lexiscan and surgery   Provider:   You may see Thomasene Ripple, DO or one of the  following Advanced Practice Providers on your designated Care Team:   Nicolasa Ducking, NP Eula Listen, PA-C Cadence Fransico Michael, [place] Charlsie Quest, NP    Other Instructions -None

## 2023-01-05 ENCOUNTER — Encounter (HOSPITAL_COMMUNITY): Payer: Self-pay | Admitting: Emergency Medicine

## 2023-01-07 ENCOUNTER — Ambulatory Visit (HOSPITAL_COMMUNITY): Admission: RE | Admit: 2023-01-07 | Payer: 59 | Source: Home / Self Care | Admitting: Diagnostic Radiology

## 2023-01-07 ENCOUNTER — Encounter (HOSPITAL_COMMUNITY): Admission: RE | Payer: Self-pay | Source: Home / Self Care

## 2023-01-07 ENCOUNTER — Ambulatory Visit (HOSPITAL_COMMUNITY): Payer: 59

## 2023-01-07 SURGERY — CT WITH ANESTHESIA
Anesthesia: General | Laterality: Right

## 2023-01-09 ENCOUNTER — Emergency Department (HOSPITAL_BASED_OUTPATIENT_CLINIC_OR_DEPARTMENT_OTHER): Payer: 59

## 2023-01-09 ENCOUNTER — Encounter (HOSPITAL_BASED_OUTPATIENT_CLINIC_OR_DEPARTMENT_OTHER): Payer: Self-pay

## 2023-01-09 ENCOUNTER — Other Ambulatory Visit: Payer: Self-pay

## 2023-01-09 ENCOUNTER — Emergency Department (HOSPITAL_BASED_OUTPATIENT_CLINIC_OR_DEPARTMENT_OTHER)
Admission: EM | Admit: 2023-01-09 | Discharge: 2023-01-09 | Disposition: A | Discharge status: Home / Self Care | Payer: 59 | Attending: Emergency Medicine | Admitting: Emergency Medicine

## 2023-01-09 DIAGNOSIS — N189 Chronic kidney disease, unspecified: Secondary | ICD-10-CM | POA: Diagnosis not present

## 2023-01-09 DIAGNOSIS — I509 Heart failure, unspecified: Secondary | ICD-10-CM | POA: Insufficient documentation

## 2023-01-09 DIAGNOSIS — Z79899 Other long term (current) drug therapy: Secondary | ICD-10-CM | POA: Diagnosis not present

## 2023-01-09 DIAGNOSIS — R0602 Shortness of breath: Secondary | ICD-10-CM | POA: Diagnosis not present

## 2023-01-09 DIAGNOSIS — M79662 Pain in left lower leg: Secondary | ICD-10-CM | POA: Diagnosis not present

## 2023-01-09 DIAGNOSIS — I130 Hypertensive heart and chronic kidney disease with heart failure and stage 1 through stage 4 chronic kidney disease, or unspecified chronic kidney disease: Secondary | ICD-10-CM | POA: Diagnosis not present

## 2023-01-09 LAB — CBC WITH DIFFERENTIAL/PLATELET
Abs Immature Granulocytes: 0.05 10*3/uL (ref 0.00–0.07)
Basophils Absolute: 0 10*3/uL (ref 0.0–0.1)
Basophils Relative: 0 %
Eosinophils Absolute: 0.2 10*3/uL (ref 0.0–0.5)
Eosinophils Relative: 1 %
HCT: 37.8 % — ABNORMAL LOW (ref 39.0–52.0)
Hemoglobin: 12.6 g/dL — ABNORMAL LOW (ref 13.0–17.0)
Immature Granulocytes: 1 %
Lymphocytes Relative: 14 %
Lymphs Abs: 1.5 10*3/uL (ref 0.7–4.0)
MCH: 28.8 pg (ref 26.0–34.0)
MCHC: 33.3 g/dL (ref 30.0–36.0)
MCV: 86.3 fL (ref 80.0–100.0)
Monocytes Absolute: 0.8 10*3/uL (ref 0.1–1.0)
Monocytes Relative: 8 %
Neutro Abs: 8.3 10*3/uL — ABNORMAL HIGH (ref 1.7–7.7)
Neutrophils Relative %: 76 %
Platelets: 280 10*3/uL (ref 150–400)
RBC: 4.38 MIL/uL (ref 4.22–5.81)
RDW: 12.7 % (ref 11.5–15.5)
WBC: 10.8 10*3/uL — ABNORMAL HIGH (ref 4.0–10.5)
nRBC: 0 % (ref 0.0–0.2)

## 2023-01-09 LAB — COMPREHENSIVE METABOLIC PANEL
ALT: 17 U/L (ref 0–44)
AST: 12 U/L — ABNORMAL LOW (ref 15–41)
Albumin: 4.2 g/dL (ref 3.5–5.0)
Alkaline Phosphatase: 81 U/L (ref 38–126)
Anion gap: 9 (ref 5–15)
BUN: 23 mg/dL — ABNORMAL HIGH (ref 6–20)
CO2: 22 mmol/L (ref 22–32)
Calcium: 9.4 mg/dL (ref 8.9–10.3)
Chloride: 104 mmol/L (ref 98–111)
Creatinine, Ser: 1.72 mg/dL — ABNORMAL HIGH (ref 0.61–1.24)
GFR, Estimated: 50 mL/min — ABNORMAL LOW (ref >60)
Glucose, Bld: 95 mg/dL (ref 70–99)
Potassium: 4.3 mmol/L (ref 3.5–5.1)
Sodium: 135 mmol/L (ref 135–145)
Total Bilirubin: 0.5 mg/dL (ref 0.3–1.2)
Total Protein: 7.5 g/dL (ref 6.5–8.1)

## 2023-01-09 LAB — D-DIMER, QUANTITATIVE: D-Dimer, Quant: 0.91 ug/mL-FEU — ABNORMAL HIGH (ref 0.00–0.50)

## 2023-01-09 LAB — TROPONIN I (HIGH SENSITIVITY)
Troponin I (High Sensitivity): 6 ng/L (ref <18)
Troponin I (High Sensitivity): 5 ng/L (ref <18)

## 2023-01-09 MED ORDER — IOHEXOL 350 MG/ML SOLN
100.0000 mL | Freq: Once | INTRAVENOUS | Status: AC | PRN
  Administered 2023-01-09: 100 mL via INTRAVENOUS

## 2023-01-09 MED ORDER — SODIUM CHLORIDE 0.9 % IV BOLUS
500.0000 mL | Freq: Once | INTRAVENOUS | Status: AC
  Administered 2023-01-09: 500 mL via INTRAVENOUS

## 2023-01-09 NOTE — ED Triage Notes (Signed)
Patient here POV from Home.  Endorses SOB and Dizziness for approximately 2 Days. Intermittent mostly and Consistent in that it has not become better or worse. No CP. No N/V/D.   NAD Noted during Triage. A&Ox4. GCS 15. Ambulatory.

## 2023-01-09 NOTE — Discharge Instructions (Signed)
Please read and follow all provided instructions.  Your diagnoses today include:  1. Shortness of breath   2. Pain of left lower leg     Tests performed today include: An EKG of your heart: No sign of stress on the heart or abnormal rhythms A chest x-ray: Was clear without signs of pneumonia or fluid buildup Cardiac enzymes - a blood test for heart muscle damage, were normal Blood counts and electrolytes: Stable kidney function Ultrasound of your left lower extremity did not show sign of DVT or blood clot Blood test screening for blood clot: Was elevated CT of the chest: Was poor quality for the smaller blood vessels, however no large blood clot seen or other problems with the lungs Vital signs. See below for your results today.   Medications prescribed:  None  Take any prescribed medications only as directed.  Follow-up instructions: Please follow-up with your primary care provider as soon as you can for further evaluation of your symptoms.   Return instructions:  SEEK IMMEDIATE MEDICAL ATTENTION IF: You have severe chest pain, especially if the pain is crushing or pressure-like and spreads to the arms, back, neck, or jaw, or if you have sweating, nausea or vomiting, or trouble with breathing. THIS IS AN EMERGENCY. Do not wait to see if the pain will go away. Get medical help at once. Call 911. DO NOT drive yourself to the hospital.  Your chest pain gets worse and does not go away after a few minutes of rest.  You have an attack of chest pain lasting longer than what you usually experience.  You have significant dizziness, if you pass out, or have trouble walking.  You have chest pain not typical of your usual pain for which you originally saw your caregiver.  You have any other emergent concerns regarding your health.  Additional Information: Chest pain comes from many different causes. Your caregiver has diagnosed you as having chest pain that is not specific for one problem,  but does not require admission.  You are at low risk for an acute heart condition or other serious illness.   Your vital signs today were: BP (!) 140/69   Pulse 67   Temp 98.5 F (36.9 C) (Oral)   Resp (!) 23   Ht 6\' 1"  (1.854 m)   Wt (!) 169 kg   SpO2 97%   BMI 49.16 kg/m  If your blood pressure (BP) was elevated above 135/85 this visit, please have this repeated by your doctor within one month. --------------

## 2023-01-09 NOTE — ED Notes (Signed)
Patient ambulates approximately 100 yards with no complaints of dizziness, lightheadedness, shortness of breath. Oxygen maintained at 99% for the duration of the ambulation.

## 2023-01-09 NOTE — ED Provider Notes (Signed)
[facility] AT [facility] Provider Note   CSN: [id number] Arrival date & time: 01/09/23  1405     History  Chief Complaint  Patient presents with   Shortness of Breath    Calvin Bailey is a 42 y.o. male.  Patient with history of hypertension, sleep apnea, recent diagnosis of renal cell carcinoma with surgery planned but not yet on treatment, history of chronic kidney disease, history of CHF and GERD --presents to the emergency department today for evaluation of shortness of breath.  Patient states that over the past 3 or so days he has had increasing shortness of breath, especially with exertion.  He reports that a week ago he was in [facility] and was able to walk around without any difficulty.  Now he gets short of breath walking around at home, including needing to take breaks walking up stairs in his house.  No associated chest pain.  No fevers or cough.  Patient has had leg pain in the left lower leg that seems new.  He does have chronic lower extremity edema, left greater than right.  He flew direct from [place] to [facility], each flight being around 4-5 hours.       Home Medications Prior to Admission medications   Medication Sig Start Date End Date Taking? Authorizing Provider  acetaminophen (TYLENOL) 500 MG tablet Take 1 tablet (500 mg total) by mouth every 6 (six) hours as needed. Patient taking differently: Take 1,000 mg by mouth every 6 (six) hours as needed for mild pain. 11/13/22   Arby Barrette, MD  amLODipine (NORVASC) 10 MG tablet TAKE 1 TABLET(10 MG) BY MOUTH DAILY 12/23/22   Tobb, Kardie, DO  colchicine 0.6 MG tablet Take 1 tablet (0.6 mg total) by mouth 2 (two) times daily. 12/08/22   Alyson Reedy, FNP  hydrALAZINE (APRESOLINE) 100 MG tablet Take 1 tablet (100 mg total) by mouth 3 (three) times daily. Patient taking differently: Take 100 mg by mouth 2 (two) times daily. 08/24/22   Chilton Si, MD  isosorbide mononitrate  (IMDUR) 30 MG 24 hr tablet Take 1 tablet (30 mg total) by mouth daily. 11/22/22 02/20/23  Pokhrel, Rebekah Chesterfield, MD  labetalol (NORMODYNE) 300 MG tablet Take 1 tablet (300 mg total) by mouth 3 (three) times daily. 11/21/22   Pokhrel, Rebekah Chesterfield, MD  spironolactone (ALDACTONE) 25 MG tablet TAKE 1 TABLET(25 MG) BY MOUTH DAILY 12/23/22   Tobb, Kardie, DO  valsartan (DIOVAN) 160 MG tablet Take 1 tablet (160 mg total) by mouth 2 (two) times daily. 07/01/22   Alver Sorrow, NP  LISINOPRIL-HYDROCHLOROTHIAZIDE PO Take by mouth.  07/06/19  [provider]      Allergies    Chlorthalidone and Lisinopril    Review of Systems   Review of Systems  Physical Exam Updated Vital Signs BP 118/68 (BP Location: Right Arm)   Pulse 68   Temp 98.5 F (36.9 C) (Oral)   Resp 14   Ht 6\' 1"  (1.854 m)   Wt (!) 169 kg   SpO2 96%   BMI 49.16 kg/m   Physical Exam Vitals and nursing note reviewed.  Constitutional:      General: He is not in acute distress.    Appearance: He is well-developed.  HENT:     Head: Normocephalic and atraumatic.  Eyes:     General:        Right eye: No discharge.        Left eye: No discharge.  Conjunctiva/sclera: Conjunctivae normal.  Cardiovascular:     Rate and Rhythm: Normal rate and regular rhythm.     Heart sounds: Normal heart sounds.  Pulmonary:     Effort: Pulmonary effort is normal.     Breath sounds: Normal breath sounds.  Abdominal:     Palpations: Abdomen is soft.     Tenderness: There is no abdominal tenderness.  Musculoskeletal:     Cervical back: Normal range of motion and neck supple.     Right lower leg: Edema present.     Left lower leg: Edema present.     Comments: Bilateral LE edema, L>R.  Patient does have tenderness without overlying erythema in the left lower shin area.  I do not feel any abscess and this does not appear to be particularly cellulitic.  It is not an area where I would typically expect to see a gouty arthritis.    Skin:     General: Skin is warm and dry.  Neurological:     Mental Status: He is alert.     ED Results / Procedures / Treatments   Labs (all labs ordered are listed, but only abnormal results are displayed) Labs Reviewed  CBC WITH DIFFERENTIAL/PLATELET - Abnormal; Notable for the following components:      Result Value   WBC 10.8 (*)    Hemoglobin 12.6 (*)    HCT 37.8 (*)    Neutro Abs 8.3 (*)    All other components within normal limits  COMPREHENSIVE METABOLIC PANEL - Abnormal; Notable for the following components:   BUN 23 (*)    Creatinine, Ser 1.72 (*)    AST 12 (*)    GFR, Estimated 50 (*)    All other components within normal limits  D-DIMER, QUANTITATIVE - Abnormal; Notable for the following components:   D-Dimer, Quant 0.91 (*)    All other components within normal limits  TROPONIN I (HIGH SENSITIVITY)  TROPONIN I (HIGH SENSITIVITY)    ED ECG REPORT   Date: 01/09/2023  Rate: 65  Rhythm: normal sinus rhythm  QRS Axis: normal  Intervals: normal  ST/T Wave abnormalities: normal  Conduction Disutrbances:none  Narrative Interpretation:   Old EKG Reviewed: TWI in aVL resolved today compared to previous, otherwise unchanged  I have personally reviewed the EKG tracing and agree with the computerized printout as noted.   Radiology CT Angio Chest PE W and/or Wo Contrast  Result Date: 01/09/2023 CLINICAL DATA:  Pulmonary embolism (PE) suspected, low to intermediate prob, positive D-dimer EXAM: CT ANGIOGRAPHY CHEST WITH CONTRAST TECHNIQUE: Multidetector CT imaging of the chest was performed using the standard protocol during bolus administration of intravenous contrast. Multiplanar CT image reconstructions and MIPs were obtained to evaluate the vascular anatomy. RADIATION DOSE REDUCTION: This exam was performed according to the departmental dose-optimization program which includes automated exposure control, adjustment of the mA and/or kV according to patient size and/or use of  iterative reconstruction technique. CONTRAST:  OMNIPAQUE IOHEXOL 350 MG/ML SOLN COMPARISON:  11/13/2022 FINDINGS: Cardiovascular: Evaluation of the pulmonary arteries is degraded by poor contrast bolus timing and beam hardening artifact related to patient body habitus. No central or main branch pulmonary embolism is identified. Nondiagnostic evaluation of the more distal pulmonary arterial branches. Pulmonary vasculature is nondilated. Thoracic aorta is normal in course and caliber. Heart size within normal limits. No pericardial effusion. Mediastinum/Nodes: No enlarged mediastinal, hilar, or axillary lymph nodes. Thyroid gland, trachea, and esophagus demonstrate no significant findings. Lungs/Pleura: Lungs are clear. No pleural  effusion or pneumothorax. Upper Abdomen: No acute abnormality. Musculoskeletal: No acute osseous abnormality. Stable well-circumscribed ovoid 4.3 cm structure within the superficial subcutaneous soft tissues of the posterior right chest wall, likely sebaceous or epidermoid cyst. Bilateral gynecomastia. Review of the MIP images confirms the above findings. IMPRESSION: 1. Evaluation of the pulmonary arteries is degraded by poor contrast bolus timing and beam hardening artifact related to patient body habitus. No central or main branch pulmonary embolism is identified. Nondiagnostic evaluation of the more distal pulmonary arterial branches. 2. Lungs are clear. 3. Stable well-circumscribed ovoid 4.3 cm structure within the superficial subcutaneous soft tissues of the posterior right chest wall, likely sebaceous or epidermoid cyst. Electronically Signed   By: Duanne Guess D.O.   On: 01/09/2023 17:01   US Venous Img Lower  Left (DVT Study)  Result Date: 01/09/2023 CLINICAL DATA:  Chronic bilateral leg swelling, recent left shin pain EXAM: LEFT LOWER EXTREMITY VENOUS DOPPLER ULTRASOUND TECHNIQUE: Gray-scale sonography with graded compression, as well as color Doppler and duplex  ultrasound were performed to evaluate the lower extremity deep venous systems from the level of the common femoral vein and including the common femoral, femoral, profunda femoral, popliteal and calf veins including the posterior tibial, peroneal and gastrocnemius veins when visible. The superficial great saphenous vein was also interrogated. Spectral Doppler was utilized to evaluate flow at rest and with distal augmentation maneuvers in the common femoral, femoral and popliteal veins. COMPARISON:  None Available. FINDINGS: Contralateral Common Femoral Vein: Respiratory phasicity is normal and symmetric with the symptomatic side. No evidence of thrombus. Normal compressibility. Common Femoral Vein: No evidence of thrombus. Normal compressibility, respiratory phasicity and response to augmentation. Saphenofemoral Junction: No evidence of thrombus. Normal compressibility and flow on color Doppler imaging. Profunda Femoral Vein: No evidence of thrombus. Normal compressibility and flow on color Doppler imaging. Femoral Vein: No evidence of thrombus. Normal compressibility, respiratory phasicity and response to augmentation. Popliteal Vein: No evidence of thrombus. Normal compressibility, respiratory phasicity and response to augmentation. Calf Veins: No evidence thrombus. However, limited visualization secondary to overlying soft tissue edema. Superficial Great Saphenous Vein: No evidence of thrombus. Normal compressibility. Venous Reflux:  None. Other Findings:  None. IMPRESSION: No evidence of deep venous thrombosis in the left lower extremity. Electronically Signed   By: Jacob Moores M.D.   On: 01/09/2023 15:47   DG Chest Portable 1 View  Result Date: 01/09/2023 CLINICAL DATA:  Shortness of breath EXAM: PORTABLE CHEST 1 VIEW COMPARISON:  Chest x-ray November 20, 2022 FINDINGS: The cardiomediastinal silhouette is unchanged in contour. No focal pulmonary opacity. No pleural effusion or pneumothorax. The  visualized upper abdomen is unremarkable. No acute osseous abnormality. IMPRESSION: No acute cardiopulmonary abnormality. Electronically Signed   By: Jacob Moores M.D.   On: 01/09/2023 14:46    Procedures Procedures    Medications Ordered in ED Medications - No data to display  ED Course/ Medical Decision Making/ A&P    Patient seen and examined. History obtained directly from patient.   Labs/EKG: Ordered CBC, BMP, d-dimer, trop.  EKG personally reviewed and interpreted as above, no concerning findings.  Imaging: Ordered chest x-ray, lower extremity Doppler for DVT.  Medications/Fluids: None ordered  Most recent vital signs reviewed and are as follows: BP 118/68 (BP Location: Right Arm)   Pulse 68   Temp 98.5 F (36.9 C) (Oral)   Resp 14   Ht 6\' 1"  (1.854 m)   Wt (!) 169 kg   SpO2 96%   BMI  49.16 kg/m   Initial impression: Shortness of breath and patient with DVT/PE risk factors.  Workup ongoing.  5:34 PM Reassessment performed. Patient appears stable during ED stay.  Per RN: Patient ambulates approximately 100 yards with no complaints of dizziness, lightheadedness, shortness of breath. Oxygen maintained at 99% for the duration of the ambulation.   Labs personally reviewed and interpreted including: CBC minimally elevated white blood cell count at 10.8, hemoglobin slightly low at 12.6 otherwise unremarkable; CMP creatinine at baseline 1.72 with BUN of 23, normal liver function test; troponin 6 >> 5; D-dimer elevated at 0.91.  Imaging personally visualized and interpreted including: Chest x-ray agree negative  Reviewed results of DVT study, negative for DVT in the left lower extremity, some soft tissue swelling noted.  Due to elevated D-dimer, CTA of the chest personally reviewed and interpreted, agree no central PE, poor bolus timing limiting evaluation of smaller vessels.  Reviewed pertinent lab work and imaging with patient at bedside. Questions answered.   We  discussed that overall findings are reassuring in regards to ACS, PE, pneumonia, heart failure, etc.  It was also reassuring that he did well with ambulation without desaturation or tachycardia.  Considering overall well appearance, vital signs and other workup, I do not feel that patient needs to be admitted for consideration of V/Q imaging.  Overall my concern for PE at this point is low.  Patient is in agreement and feels comfortable with discharge.  Most current vital signs reviewed and are as follows: BP (!) 140/69   Pulse 67   Temp 98.5 F (36.9 C) (Oral)   Resp (!) 23   Ht  (1.854 m)   Wt (!) 169 kg   SpO2 97%   BMI 49.16 kg/m   Plan: Discharge to home.   Prescriptions written for: None ordered, patient will take Tylenol at home for his lower leg pain  Other home care instructions discussed: Elevation of lower extremity  ED return instructions discussed: Discussed at length with the patient, return with persistent chest pain, worsening shortness of breath, new symptoms or other concerns.  Follow-up instructions discussed: Patient encouraged to follow-up with their PCP in 2 days.                             Medical Decision Making Amount and/or Complexity of Data Reviewed Labs: ordered. Radiology: ordered.   Patient presents for shortness of breath.  Concerning risk factors include recent airplane travel and diagnosis of renal cell carcinoma.  EKG was normal.  Troponins normal.  Patient was evaluated for thromboembolism.  Left lower extremity DVT study was negative.  He did have some pain in his leg, without obvious cellulitis or abscess.  D-dimer was elevated and therefore a CTA of the chest was ordered.  This did not demonstrate any central emboli.  Patient has been able to ambulate in the emergency department very well without desaturation or tachycardia.  Clinically he looks well.  At this time I feel low likelihood for PE, ACS, pneumonia.  Remainder of blood cell  counts are reassuring.  Does not appear septic.  He does have an upcoming stress test with cardiology in the next 1 to 2 weeks in preparation for surgery on his kidney.  The patient's vital signs, pertinent lab work and imaging were reviewed and interpreted as discussed in the ED course. Hospitalization was considered for further testing, treatments, or serial exams/observation. However as patient is well-appearing,  has a stable exam, and reassuring studies today, I do not feel that they warrant admission at this time. This plan was discussed with the patient who verbalizes agreement and comfort with this plan and seems reliable and able to return to the Emergency Department with worsening or changing symptoms.          Final Clinical Impression(s) / ED Diagnoses Final diagnoses:  Shortness of breath  Pain of left lower leg    Rx / DC Orders ED Discharge Orders     None         Renne Crigler, PA-C 01/09/23 1745    Glyn Ade, MD 01/09/23 1836

## 2023-01-15 ENCOUNTER — Emergency Department (HOSPITAL_BASED_OUTPATIENT_CLINIC_OR_DEPARTMENT_OTHER): Payer: 59

## 2023-01-15 ENCOUNTER — Other Ambulatory Visit: Payer: Self-pay

## 2023-01-15 ENCOUNTER — Emergency Department (HOSPITAL_BASED_OUTPATIENT_CLINIC_OR_DEPARTMENT_OTHER)
Admission: EM | Admit: 2023-01-15 | Discharge: 2023-01-15 | Disposition: A | Discharge status: Home / Self Care | Payer: 59 | Attending: Emergency Medicine | Admitting: Emergency Medicine

## 2023-01-15 ENCOUNTER — Encounter (HOSPITAL_BASED_OUTPATIENT_CLINIC_OR_DEPARTMENT_OTHER): Payer: Self-pay

## 2023-01-15 DIAGNOSIS — M79605 Pain in left leg: Secondary | ICD-10-CM | POA: Insufficient documentation

## 2023-01-15 DIAGNOSIS — I10 Essential (primary) hypertension: Secondary | ICD-10-CM | POA: Diagnosis not present

## 2023-01-15 DIAGNOSIS — M1710 Unilateral primary osteoarthritis, unspecified knee: Secondary | ICD-10-CM

## 2023-01-15 MED ORDER — GABAPENTIN 100 MG PO CAPS: 100.0000 mg | ORAL_CAPSULE | Freq: Three times a day (TID) | ORAL | 0 refills | Status: DC

## 2023-01-15 NOTE — Discharge Instructions (Signed)
You were seen in the emergency room today with leg pain.  Your x-ray shows severe arthritis in your knee which may be causing some of your pain but I also wonder if some of your discomfort may be from neuropathy type pain.  I am starting you on gabapentin which may need to be dose adjusted by your primary care doctor.  I am calling in a 2-week supply but will have you follow-up with your primary care to reassess and see if this is helping you.  Do not take this at the same time as tramadol or other strong pain medicines.  Do not drive a car while taking this. Do not take this medication with alcohol.

## 2023-01-15 NOTE — ED Notes (Signed)
Radiology at bedside

## 2023-01-15 NOTE — ED Provider Notes (Signed)
Emergency Department Provider Note   I have reviewed the triage vital signs and the nursing notes.   HISTORY  Chief Complaint Leg Swelling   HPI Calvin Bailey is a 42 y.o. male with PMH of lymphedema presents to the emergency department with left lower leg pain and swelling.  He has swelling of bilateral legs from his lymphedema but notes that the left leg is worse.  He has been seen for this pain in the emergency department as well as on his vascular office with a negative ultrasound for DVT x 2.  No prior x-rays.  Denies rash or fever. No injury. Has been using tramadol with minimal relief.    Past Medical History:  Diagnosis Date   Dizziness 09/25/2019   GERD (gastroesophageal reflux disease)    HTN (hypertension)    Obesity hypoventilation syndrome 04/08/2014   OSA (obstructive sleep apnea) 04/08/2014   PFO (patent foramen ovale)    Sleep apnea     Review of Systems  Constitutional: No fever/chills Cardiovascular: Denies chest pain. Respiratory: Denies shortness of breath. Gastrointestinal: No abdominal pain.  No nausea, no vomiting.   Musculoskeletal: Negative for back pain. Positive left leg pain.  Skin: Negative for rash. Neurological: Negative for headaches, focal weakness or numbness.  ____________________________________________   PHYSICAL EXAM:  VITAL SIGNS: ED Triage Vitals [01/15/23 1945]  Enc Vitals Group     BP 135/79     Pulse Rate 70     Resp 18     Temp (!) 97.4 F (36.3 C)     Temp src      SpO2 95 %     Weight (!) 370 lb (167.8 kg)     Height  (1.854 m)   Constitutional: Alert and oriented. Well appearing and in no acute distress. Eyes: Conjunctivae are normal.  Head: Atraumatic. Nose: No congestion/rhinnorhea. Mouth/Throat: Mucous membranes are moist.  Cardiovascular: Normal rate, regular rhythm. Good peripheral circulation. Grossly normal heart sounds.   Respiratory: Normal respiratory effort.  No retractions. Lungs  CTAB. Gastrointestinal: Soft and nontender. No distention.  Musculoskeletal: Positive lymphedema bilaterally. No cellulitis or ulcerations. Compartments are soft. No joint effusions.  Neurologic:  Normal speech and language.  Skin:  Skin is warm, dry and intact. No rash noted.  ____________________________________________  RADIOLOGY  DG Knee Complete 4 Views Left  Result Date: 01/15/2023 CLINICAL DATA:  Left leg pain and swelling. EXAM: LEFT KNEE - COMPLETE 4+ VIEW COMPARISON:  None Available. FINDINGS: No evidence of fracture, dislocation, or joint effusion. Lateral marginal osteophytes are present. There is marked severity medial and lateral tibiofemoral compartment space narrowing. Marked severity patellofemoral narrowing is also seen. Soft tissues are unremarkable. IMPRESSION: Marked severity tricompartmental degenerative changes. Electronically Signed   By: Aram Candela M.D.   On: 01/15/2023 20:38   DG Ankle Complete Left  Result Date: 01/15/2023 CLINICAL DATA:  Left lower leg pain and swelling. EXAM: LEFT ANKLE COMPLETE - 3+ VIEW COMPARISON:  None Available. FINDINGS: There is no evidence of fracture, dislocation, or joint effusion. There is no evidence of significant arthropathy or other focal bone abnormality. Diffuse soft tissue swelling is noted which is, in part, related to the patient's body habitus. IMPRESSION: Diffuse soft tissue swelling without evidence of acute fracture or dislocation. Electronically Signed   By: Aram Candela M.D.   On: 01/15/2023 20:34    ____________________________________________   PROCEDURES  Procedure(s) performed:   Procedures  None  ____________________________________________   INITIAL IMPRESSION / ASSESSMENT AND  PLAN / ED COURSE  Pertinent labs & imaging results that were available during my care of the patient were reviewed by me and considered in my medical decision making (see chart for details).   This patient is Presenting  for Evaluation of leg pain, which does require a range of treatment options, and is a complaint that involves a moderate risk of morbidity and mortality.  The Differential Diagnoses include lymphedema pain, DVT, neuropathy, arthritis, septic joint, fracture, dislocation, etc   I decided to review pertinent External Data, and in summary patient with lab workup on 4/14 including basic metabolic panel with baseline kidney function.  CBC with mild leukocytosis to 10.8.  Troponin negative x 2.  D-dimer slightly elevated leading to CTA of the chest.  Patient also with left lower extremity DVT ultrasound showing DVT.  Radiologic Tests Ordered, included XR left knee and ankle. I independently interpreted the images and agree with radiology interpretation.    Medical Decision Making: Summary:  Patient presents to the emergency department with continued left leg pain not responding to tramadol.  Has lymphedema on exam but no evidence of infection.  No concern for compartment syndrome or septic joint clinically.  Do not feel that repeat DVT ultrasound be beneficial as he has had 2 negative studies in the last week.  No PE on CTA.  X-ray does show significant osteoarthritis especially in the left knee.  Plan to refer to orthopedics for follow-up.  Plan to start gabapentin for possible neuropathy type pain. Advised that he will need close PCP follow up for dose adjustment. Cautioned to not take with EtOH, Tramadol, or other strong pain medication.    Patient's presentation is most consistent with acute, uncomplicated illness.   Disposition: discharge  ____________________________________________  FINAL CLINICAL IMPRESSION(S) / ED DIAGNOSES  Final diagnoses:  Left leg pain  Arthritis of knee     NEW OUTPATIENT MEDICATIONS STARTED DURING THIS VISIT:  Discharge Medication List as of 01/15/2023  9:28 PM     START taking these medications   Details  gabapentin (NEURONTIN) 100 MG capsule Take 1  capsule (100 mg total) by mouth 3 (three) times daily for 14 days., Starting Sat 01/15/2023, Until Sat 01/29/2023, Normal        Note:  This document was prepared using Dragon voice recognition software and may include unintentional dictation errors.  Alona Bene, MD, [facility]    Shenandoah Vandergriff, Arlyss Repress, MD 01/16/23 [phone]

## 2023-01-15 NOTE — ED Triage Notes (Signed)
Left lower leg swelling, pain and says it feels hot.   Says it has worsened since last visit. Recent DVT study on 4/14 that showed no blood clots.   Hx lymphedema.

## 2023-01-17 ENCOUNTER — Other Ambulatory Visit: Payer: Self-pay | Admitting: Cardiology

## 2023-01-17 ENCOUNTER — Encounter
Admission: RE | Admit: 2023-01-17 | Discharge: 2023-01-17 | Disposition: A | Discharge status: Home / Self Care | Payer: 59 | Source: Ambulatory Visit | Attending: Cardiology | Admitting: Cardiology

## 2023-01-17 DIAGNOSIS — G4733 Obstructive sleep apnea (adult) (pediatric): Secondary | ICD-10-CM

## 2023-01-17 DIAGNOSIS — N1831 Chronic kidney disease, stage 3a: Secondary | ICD-10-CM

## 2023-01-17 DIAGNOSIS — R079 Chest pain, unspecified: Secondary | ICD-10-CM

## 2023-01-17 DIAGNOSIS — Z01818 Encounter for other preprocedural examination: Secondary | ICD-10-CM

## 2023-01-17 DIAGNOSIS — E662 Morbid (severe) obesity with alveolar hypoventilation: Secondary | ICD-10-CM

## 2023-01-17 DIAGNOSIS — I5032 Chronic diastolic (congestive) heart failure: Secondary | ICD-10-CM

## 2023-01-17 DIAGNOSIS — I10 Essential (primary) hypertension: Secondary | ICD-10-CM

## 2023-01-17 DIAGNOSIS — F17200 Nicotine dependence, unspecified, uncomplicated: Secondary | ICD-10-CM

## 2023-01-17 LAB — NM MYOCAR SINGLE W/SPECT
Peak HR: 82 {beats}/min
Percent HR: 46 %
Rest HR: 57 {beats}/min
ST Depression (mm): 0 mm
Stress Nuclear Isotope Dose: 31.9 mCi

## 2023-01-17 MED ORDER — TECHNETIUM TC 99M TETROFOSMIN IV KIT
30.0000 | PACK | Freq: Once | INTRAVENOUS | Status: AC | PRN
  Administered 2023-01-17: 31.86 via INTRAVENOUS

## 2023-01-17 MED ORDER — REGADENOSON 0.4 MG/5ML IV SOLN
0.4000 mg | Freq: Once | INTRAVENOUS | Status: AC
  Administered 2023-01-17: 0.4 mg via INTRAVENOUS
  Filled 2023-01-17: qty 5

## 2023-01-17 NOTE — Progress Notes (Signed)
Read as a low risk study. No ischemia or infarct noted. Overall a reassuring study.

## 2023-01-26 ENCOUNTER — Other Ambulatory Visit: Payer: Self-pay | Admitting: Cardiology

## 2023-02-03 DIAGNOSIS — I890 Lymphedema, not elsewhere classified: Secondary | ICD-10-CM | POA: Insufficient documentation

## 2023-02-08 ENCOUNTER — Other Ambulatory Visit: Payer: Self-pay | Admitting: Radiology

## 2023-02-08 DIAGNOSIS — N2889 Other specified disorders of kidney and ureter: Secondary | ICD-10-CM

## 2023-02-08 NOTE — Progress Notes (Addendum)
Anesthesia Review:  PCP: Alyson Reedy- LOV 12/08/22  Cardiologist :  Charlsie Quest- LOV 08/05/22  Chest x-ray : 1v-01/09/23  EKG : 4/`14/24  Echo : 11/20/22  Stress test: 01/17/23  Cardiac Cath :  11/20/22  Activity level:  Sleep Study/ CPAP : Fasting Blood Sugar :      / Checks Blood Sugar -- times a day:   Blood Thinner/ Instructions /Last Dose: ASA / Instructions/ Last Dose :    01/15/23- IN ED with elft leg pain  01/09/23- IN ED with SOB  Orders requested on 02/08/23

## 2023-02-09 NOTE — Patient Instructions (Addendum)
SURGICAL WAITING ROOM VISITATION  Patients having surgery or a procedure may have no more than 2 support people in the waiting area - these visitors may rotate.    Children under the age of 38 must have an adult with them who is not the patient.  Due to an increase in RSV and influenza rates and associated hospitalizations, children ages 11 and under may not visit patients in [facility] hospitals.  If the patient needs to stay at the hospital during part of their recovery, the visitor guidelines for inpatient rooms apply. Pre-op nurse will coordinate an appropriate time for 1 support person to accompany patient in pre-op.  This support person may not rotate.    Please refer to the [facility] website for the visitor guidelines for Inpatients (after your surgery is over and you are in a regular room).       Your procedure is scheduled on:  02/16/23    Report to [facility] Main Entrance    Report to admitting at   [phone]   Call this number if you have problems the morning of surgery [phone]   Do not eat food  or drink liquids :After Midnight.                            If you have questions, please contact your surgeon's office.      Oral Hygiene is also important to reduce your risk of infection.                                    Remember - BRUSH YOUR TEETH THE MORNING OF SURGERY WITH YOUR REGULAR TOOTHPASTE  DENTURES WILL BE REMOVED PRIOR TO SURGERY PLEASE DO NOT APPLY "Poly grip" OR ADHESIVES!!!   Do NOT smoke after Midnight   Take these medicines the morning of surgery with A SIP OF WATER:  amlodipine, hydralazine, imdur, labetalol   DO NOT TAKE ANY ORAL DIABETIC MEDICATIONS DAY OF YOUR SURGERY  Bring CPAP mask and tubing day of surgery.                              You may not have any metal on your body including hair pins, jewelry, and body piercing             Do not wear make-up, lotions, powders, perfumes/cologne, or deodorant  Do not wear  nail polish including gel and S&S, artificial/acrylic nails, or any other type of covering on natural nails including finger and toenails. If you have artificial nails, gel coating, etc. that needs to be removed by a nail salon please have this removed prior to surgery or surgery may need to be canceled/ delayed if the surgeon/ anesthesia feels like they are unable to be safely monitored.   Do not shave  48 hours prior to surgery.               Men may shave face and neck.   Do not bring valuables to the hospital. [facility] IS NOT             RESPONSIBLE   FOR VALUABLES.   Contacts, glasses, dentures or bridgework may not be worn into surgery.   Bring small overnight bag day of surgery.   DO NOT BRING YOUR HOME MEDICATIONS TO THE HOSPITAL.  PHARMACY WILL DISPENSE MEDICATIONS LISTED ON YOUR MEDICATION LIST TO YOU DURING YOUR ADMISSION IN THE HOSPITAL!    Patients discharged on the day of surgery will not be allowed to drive home.  Someone NEEDS to stay with you for the first 24 hours after anesthesia.   Special Instructions: Bring a copy of your healthcare power of attorney and living will documents the day of surgery if you haven't scanned them before.              Please read over the following fact sheets you were given: IF YOU HAVE QUESTIONS ABOUT YOUR PRE-OP INSTRUCTIONS PLEASE CALL [phone]   If you received a COVID test during your pre-op visit  it is requested that you wear a mask when out in public, stay away from anyone that may not be feeling well and notify your surgeon if you develop symptoms. If you test positive for Covid or have been in contact with anyone that has tested positive in the last 10 days please notify you surgeon.    [facility] - Preparing for Surgery Before surgery, you can play an important role.  Because skin is not sterile, your skin needs to be as free of germs as possible.  You can reduce the number of germs on your skin by washing with CHG  (chlorahexidine gluconate) soap before surgery.  CHG is an antiseptic cleaner which kills germs and bonds with the skin to continue killing germs even after washing. Please DO NOT use if you have an allergy to CHG or antibacterial soaps.  If your skin becomes reddened/irritated stop using the CHG and inform your nurse when you arrive at Short Stay. Do not shave (including legs and underarms) for at least 48 hours prior to the first CHG shower.  You may shave your face/neck. Please follow these instructions carefully:  1.  Shower with CHG Soap the night before surgery and the  morning of Surgery.  2.  If you choose to wash your hair, wash your hair first as usual with your  normal  shampoo.  3.  After you shampoo, rinse your hair and body thoroughly to remove the  shampoo.                           4.  Use CHG as you would any other liquid soap.  You can apply chg directly  to the skin and wash                       Gently with a scrungie or clean washcloth.  5.  Apply the CHG Soap to your body ONLY FROM THE NECK DOWN.   Do not use on face/ open                           Wound or open sores. Avoid contact with eyes, ears mouth and genitals (private parts).                       Wash face,  Genitals (private parts) with your normal soap.             6.  Wash thoroughly, paying special attention to the area where your surgery  will be performed.  7.  Thoroughly rinse your body with warm water from the neck down.  8.  DO NOT shower/wash with your normal soap after  using and rinsing off  the CHG Soap.                9.  Pat yourself dry with a clean towel.            10.  Wear clean pajamas.            11.  Place clean sheets on your bed the night of your first shower and do not  sleep with pets. Day of Surgery : Do not apply any lotions/deodorants the morning of surgery.  Please wear clean clothes to the hospital/surgery center.  FAILURE TO FOLLOW THESE INSTRUCTIONS MAY RESULT IN THE CANCELLATION OF  YOUR SURGERY PATIENT SIGNATURE_________________________________  NURSE SIGNATURE__________________________________  ________________________________________________________________________

## 2023-02-11 ENCOUNTER — Other Ambulatory Visit: Payer: Self-pay

## 2023-02-11 ENCOUNTER — Encounter (HOSPITAL_COMMUNITY): Payer: Self-pay

## 2023-02-11 ENCOUNTER — Encounter (HOSPITAL_COMMUNITY)
Admission: RE | Admit: 2023-02-11 | Discharge: 2023-02-11 | Disposition: A | Discharge status: Home / Self Care | Payer: 59 | Source: Ambulatory Visit | Attending: Diagnostic Radiology | Admitting: Diagnostic Radiology

## 2023-02-11 DIAGNOSIS — N2889 Other specified disorders of kidney and ureter: Secondary | ICD-10-CM | POA: Insufficient documentation

## 2023-02-11 DIAGNOSIS — I509 Heart failure, unspecified: Secondary | ICD-10-CM | POA: Insufficient documentation

## 2023-02-11 DIAGNOSIS — G4733 Obstructive sleep apnea (adult) (pediatric): Secondary | ICD-10-CM | POA: Diagnosis not present

## 2023-02-11 DIAGNOSIS — Z87891 Personal history of nicotine dependence: Secondary | ICD-10-CM | POA: Diagnosis not present

## 2023-02-11 DIAGNOSIS — I130 Hypertensive heart and chronic kidney disease with heart failure and stage 1 through stage 4 chronic kidney disease, or unspecified chronic kidney disease: Secondary | ICD-10-CM | POA: Insufficient documentation

## 2023-02-11 DIAGNOSIS — N189 Chronic kidney disease, unspecified: Secondary | ICD-10-CM | POA: Insufficient documentation

## 2023-02-11 DIAGNOSIS — Z01812 Encounter for preprocedural laboratory examination: Secondary | ICD-10-CM | POA: Insufficient documentation

## 2023-02-11 DIAGNOSIS — Z01818 Encounter for other preprocedural examination: Secondary | ICD-10-CM

## 2023-02-11 HISTORY — DX: Chronic kidney disease, unspecified: N18.9

## 2023-02-11 HISTORY — DX: Heart failure, unspecified: I50.9

## 2023-02-11 HISTORY — DX: Pneumonia, unspecified organism: J18.9

## 2023-02-11 HISTORY — DX: Malignant (primary) neoplasm, unspecified: C80.1

## 2023-02-11 HISTORY — DX: Unspecified osteoarthritis, unspecified site: M19.90

## 2023-02-11 LAB — BASIC METABOLIC PANEL
Anion gap: 7 (ref 5–15)
BUN: 17 mg/dL (ref 6–20)
CO2: 23 mmol/L (ref 22–32)
Calcium: 8.8 mg/dL — ABNORMAL LOW (ref 8.9–10.3)
Chloride: 107 mmol/L (ref 98–111)
Creatinine, Ser: 1.48 mg/dL — ABNORMAL HIGH (ref 0.61–1.24)
GFR, Estimated: 60 mL/min — ABNORMAL LOW (ref >60)
Glucose, Bld: 101 mg/dL — ABNORMAL HIGH (ref 70–99)
Potassium: 4.2 mmol/L (ref 3.5–5.1)
Sodium: 137 mmol/L (ref 135–145)

## 2023-02-11 LAB — CBC WITH DIFFERENTIAL/PLATELET
Abs Immature Granulocytes: 0.03 10*3/uL (ref 0.00–0.07)
Basophils Absolute: 0 10*3/uL (ref 0.0–0.1)
Basophils Relative: 1 %
Eosinophils Absolute: 0.3 10*3/uL (ref 0.0–0.5)
Eosinophils Relative: 3 %
HCT: 39.7 % (ref 39.0–52.0)
Hemoglobin: 12.9 g/dL — ABNORMAL LOW (ref 13.0–17.0)
Immature Granulocytes: 0 %
Lymphocytes Relative: 20 %
Lymphs Abs: 1.5 10*3/uL (ref 0.7–4.0)
MCH: 28.6 pg (ref 26.0–34.0)
MCHC: 32.5 g/dL (ref 30.0–36.0)
MCV: 88 fL (ref 80.0–100.0)
Monocytes Absolute: 0.6 10*3/uL (ref 0.1–1.0)
Monocytes Relative: 8 %
Neutro Abs: 5.3 10*3/uL (ref 1.7–7.7)
Neutrophils Relative %: 68 %
Platelets: 235 10*3/uL (ref 150–400)
RBC: 4.51 MIL/uL (ref 4.22–5.81)
RDW: 13.2 % (ref 11.5–15.5)
WBC: 7.8 10*3/uL (ref 4.0–10.5)
nRBC: 0 % (ref 0.0–0.2)

## 2023-02-11 LAB — PROTIME-INR
INR: 1.1 (ref 0.8–1.2)
Prothrombin Time: 14 seconds (ref 11.4–15.2)

## 2023-02-11 NOTE — Progress Notes (Addendum)
Anesthesia Review:HTN,OSA, CHF,CKD stage 3  ZOX:WRUEAVW Ihor Dow MD   Alyson Reedy NP- LOV 12/08/22 epic  Cardiologist :  Thomasene Ripple MD ,Sheri Hammock-,NP LOV 01-04-23 preop eval epic  Chest x-ray : 1v-01/09/23  EKG : 4/`14/24  Echo : 11/20/22  Stress test: 01/17/23  Cardiac Cath :  11/20/22   Activity level: Able to climb a flight of stairs without SOB or Cp  Sleep Study/ CPAP :YES  Fasting Blood Sugar :      / Checks Blood Sugar -- times a day:    Blood Thinner/ Instructions /Last Dose: ASA / Instructions/ Last Dose :    01/15/23- IN ED with left leg pain  01/09/23- IN ED with SOB  Orders requested on 02/08/23

## 2023-02-14 ENCOUNTER — Other Ambulatory Visit: Payer: Self-pay | Admitting: Cardiology

## 2023-02-14 NOTE — Progress Notes (Signed)
Anesthesia Chart Review   Case: [id number] Date/Time: 02/16/23 0815   Procedure: RIGHT RENAL MASS CRYO ABLATION (Right)   Anesthesia type: General   Pre-op diagnosis: RIGHT RENAL MASS   [facility] / [facility]   Surgeons: Richarda Overlie, MD       DISCUSSION:42 y.o. former smoker with h/o HTN, GERD, CKD, OSA, CHF, right renal mass scheduled for above procedure 02/16/2023 with Dr. Richarda Overlie.   Pt seen by cardiology 01/04/2023. Per OV note, "Preoperative clearance request for procedure upcoming on Friday under general anesthesia. Previously patient was admitted to the hospital for observation with complaints of chest pain and hypertensive emergency. His cardiovascular workup was deferred to the outpatient setting. There was discussion of left heart catheterization versus coronary CTA. With his current elevated kidney function coronary CTA involves more dye than left heart catheterization. At this time he has been scheduled for a Lexiscan Myoview to complete his ischemic workup from his previous hospitalization. This will likely have to be done over 2 days. Of note he also has a current abscessed tooth and has been advised they would likely not do surgical procedure with an active infection."  Low risk stress test 01/17/23.   Anticipate pt can proceed with planned procedure barring acute status change.   VS: There were no vitals taken for this visit.  PROVIDERS: de [place], Buren Kos, MD is PCP   Cardiologist :  Thomasene Ripple, MD  LABS: Labs reviewed: Acceptable for surgery. (all labs ordered are listed, but only abnormal results are displayed)  Labs Reviewed  CBC WITH DIFFERENTIAL/PLATELET - Abnormal; Notable for the following components:      Result Value   Hemoglobin 12.9 (*)    All other components within normal limits  BASIC METABOLIC PANEL - Abnormal; Notable for the following components:   Glucose, Bld 101 (*)    Creatinine, Ser 1.48 (*)    Calcium 8.8 (*)    GFR, Estimated 60 (*)    All  other components within normal limits  PROTIME-INR     IMAGES:   EKG:   CV: Myocardial Perfusion 01/17/23    The study is normal. The study is low risk.   No ST deviation was noted.   LV perfusion is normal. There is no evidence of ischemia. There is no evidence of infarction.   Left ventricular function is normal. The left ventricular ejection fraction is normal (55-65%). End diastolic cavity size is normal.   This was supposed to be a 2-day study.  However, stress images are normal and does not need for rest imaging   Echo 11/20/2022 1. Left ventricular ejection fraction, by estimation, is 60 to 65%. The  left ventricle has normal function. The left ventricle has no regional  wall motion abnormalities. There is mild concentric left ventricular  hypertrophy. Left ventricular diastolic  parameters were normal.   2. Right ventricular systolic function is normal. The right ventricular  size is normal. There is normal pulmonary artery systolic pressure.   3. The mitral valve is normal in structure. Trivial mitral valve  regurgitation. No evidence of mitral stenosis.   4. The aortic valve is tricuspid. Aortic valve regurgitation is mild. No  aortic stenosis is present.   5. The inferior vena cava is normal in size with greater than 50%  respiratory variability, suggesting right atrial pressure of 3 mmHg.   Past Medical History:  Diagnosis Date   Arthritis    Cancer (HCC)    right kidney  CHF (congestive heart failure) (HCC)    pt. denies   Chronic kidney disease    Dizziness 09/25/2019   GERD (gastroesophageal reflux disease)    HTN (hypertension)    Obesity hypoventilation syndrome (HCC) 04/08/2014   OSA (obstructive sleep apnea) 04/08/2014   PFO (patent foramen ovale)    Pneumonia    Sleep apnea    wears Cpap    Past Surgical History:  Procedure Laterality Date   two knee surgeries Left 1995    MEDICATIONS:  acetaminophen (TYLENOL) 500 MG tablet   amLODipine  (NORVASC) 10 MG tablet   colchicine 0.6 MG tablet   gabapentin (NEURONTIN) 100 MG capsule   hydrALAZINE (APRESOLINE) 100 MG tablet   isosorbide mononitrate (IMDUR) 30 MG 24 hr tablet   labetalol (NORMODYNE) 300 MG tablet   OVER THE COUNTER MEDICATION   spironolactone (ALDACTONE) 25 MG tablet   valsartan (DIOVAN) 160 MG tablet   No current facility-administered medications for this encounter.     Jodell Cipro Ward, PA-C [facility] Pre-Surgical Testing [phone]

## 2023-02-15 ENCOUNTER — Other Ambulatory Visit: Payer: Self-pay | Admitting: Internal Medicine

## 2023-02-15 DIAGNOSIS — N2889 Other specified disorders of kidney and ureter: Secondary | ICD-10-CM

## 2023-02-15 NOTE — H&P (Incomplete)
Referring Physician(s): Winter,C  Supervising Physician: Richarda Overlie  Patient Status:  [facility] OP   Chief Complaint: Right renal cell carcinoma   Subjective: Pt known to IR team from right interpolar renal mass biopsy on 12/07/22 and consultations with Dr. Lowella Dandy on 11/09/22 and 12/14/22 to discuss treatment options for right papillary renal cell carcinoma. He is a 42 yo male, ex smoker,  with PMH sig for CHF, CKD, GERD, HTN, OSA, PFO, morbid obesity hypoventilation syndrome and multiple renal lesions. He  has additional indeterminate renal lesions including a 3 cm lesion along the right kidney upper pole that appears to contain blood products.  There are also small indeterminate lesions in the left kidney. Following discussions with Dr. Lowella Dandy he was deemed an appropriate candidate for cryoablation of the biopsy proven right interpolar RCC and presents today for the procedure. Patient understands that he will require surveillance of the treated renal lesion and other renal lesions .    Past Medical History:  Diagnosis Date   Arthritis    Cancer (HCC)    right kidney   CHF (congestive heart failure) (HCC)    pt. denies   Chronic kidney disease    Dizziness 09/25/2019   GERD (gastroesophageal reflux disease)    HTN (hypertension)    Obesity hypoventilation syndrome (HCC) 04/08/2014   OSA (obstructive sleep apnea) 04/08/2014   PFO (patent foramen ovale)    Pneumonia    Sleep apnea    wears Cpap   Past Surgical History:  Procedure Laterality Date   two knee surgeries Left 1995      Allergies: Chlorthalidone and Lisinopril  Medications: Prior to Admission medications   Medication Sig Start Date End Date Taking? Authorizing Provider  acetaminophen (TYLENOL) 500 MG tablet Take 1 tablet (500 mg total) by mouth every 6 (six) hours as needed. 11/13/22  Yes Pfeiffer, Lebron Conners, MD  amLODipine (NORVASC) 10 MG tablet TAKE 1 TABLET(10 MG) BY MOUTH DAILY 12/23/22  Yes Tobb, Kardie, DO   colchicine 0.6 MG tablet Take 1 tablet (0.6 mg total) by mouth 2 (two) times daily. Patient taking differently: Take 0.6 mg by mouth daily as needed (gout). 12/08/22  Yes Alyson Reedy, FNP  hydrALAZINE (APRESOLINE) 100 MG tablet Take 1 tablet (100 mg total) by mouth 3 (three) times daily. Patient taking differently: Take 100 mg by mouth 2 (two) times daily. 08/24/22  Yes Chilton Si, MD  isosorbide mononitrate (IMDUR) 30 MG 24 hr tablet Take 1 tablet (30 mg total) by mouth daily. 11/22/22 02/20/23 Yes Pokhrel, Laxman, MD  labetalol (NORMODYNE) 300 MG tablet Take 1 tablet (300 mg total) by mouth 3 (three) times daily. Patient taking differently: Take 300 mg by mouth 2 (two) times daily. 11/21/22  Yes Pokhrel, Laxman, MD  OVER THE COUNTER MEDICATION Take 15 mLs by mouth every other day. Soursop   Yes [provider]  spironolactone (ALDACTONE) 25 MG tablet TAKE 1 TABLET(25 MG) BY MOUTH DAILY 12/23/22  Yes Tobb, Kardie, DO  valsartan (DIOVAN) 160 MG tablet Take 1 tablet (160 mg total) by mouth 2 (two) times daily. 07/01/22  Yes Alver Sorrow, NP  gabapentin (NEURONTIN) 100 MG capsule Take 1 capsule (100 mg total) by mouth 3 (three) times daily for 14 days. Patient not taking: Reported on 02/09/2023 01/15/23 01/29/23  Long, Arlyss Repress, MD  LISINOPRIL-HYDROCHLOROTHIAZIDE PO Take by mouth.  07/06/19  [provider]     Vital Signs:   Physical Exam  Imaging: No results found.  Labs:  CBC: Recent Labs    11/23/22 1355 12/07/22 0656 01/09/23 1435 02/11/23 0842  WBC 11.1* 8.4 10.8* 7.8  HGB 13.5 13.3 12.6* 12.9*  HCT 41.0 39.7 37.8* 39.7  PLT 299 281 280 235    COAGS: Recent Labs    02/26/22 1530 12/07/22 0656 02/11/23 0842  INR 1.0 1.1 1.1  APTT 35  --   --     BMP: Recent Labs    11/20/22 0536 11/21/22 0435 11/23/22 1355 01/09/23 1435 02/11/23 0842  NA 136 134* 140 135 137  K 4.3 4.1 4.4 4.3 4.2  CL 104 103 106 104 107  CO2 23 24 20 22 23    GLUCOSE 93 93 146* 95 101*  BUN 29* 32* 25* 23* 17  CALCIUM 8.9 9.0 8.8 9.4 8.8*  CREATININE 1.61* 1.73* 1.96* 1.72* 1.48*  GFRNONAA 54* 50*  --  50* 60*    LIVER FUNCTION TESTS: Recent Labs    06/26/22 1729 11/13/22 0919 11/20/22 0536 01/09/23 1435  BILITOT 0.6 0.6 0.5 0.5  AST 14* 14* 16 12*  ALT 23 16 18 17   ALKPHOS 79 70 68 81  PROT 8.2* 7.0 7.8 7.5  ALBUMIN 4.8 3.7 4.1 4.2    Assessment and Plan: 42 yo male, ex smoker, with PMH sig for CHF, CKD, GERD, HTN, OSA, PFO, morbid obesity /hypoventilation syndrome and multiple renal lesions with recently diagnosed /biopsy proven papillary RCC of interpolar region rt kidney. He has additional indeterminate renal lesions including a 3 cm lesion along the right kidney upper pole that appears to contain blood products.  There are also small indeterminate lesions in the left kidney. Following discussions with Dr. Lowella Dandy he was deemed an appropriate candidate for cryoablation of the biopsy proven right interpolar RCC and presents today for the procedure. Patient understands that he will require surveillance of the treated renal lesion and other renal lesions.Details/risks of procedure, incl but not limited to, internal bleeding, infection, injury to adjacent structures, anesthesia related complications d/w pt with his understanding and consent.   This procedure involves the use of CT and because of the nature of the planned procedure, it is possible that we will have prolonged use of CT.  Potential radiation risks to you include (but are not limited to) the following: - A slightly elevated risk for cancer  several years later in life. This risk is typically less than 0.5% percent. This risk is low in comparison to the normal incidence of human cancer, which is 33% for women and 50% for men according to the [employer] Society. - Radiation induced injury can include skin redness, resembling a rash, tissue breakdown / ulcers and hair loss  (which can be temporary or permanent).   The likelihood of either of these occurring depends on the difficulty of the procedure and whether you are sensitive to radiation due to previous procedures, disease, or genetic conditions.   IF your procedure requires a prolonged use of radiation, you will be notified and given written instructions for further action.  It is your responsibility to monitor the irradiated area for the 2 weeks following the procedure and to notify your physician if you are concerned that you have suffered a radiation induced injury.      Electronically Signed: D. Jeananne Rama, PA-C 02/15/2023, 4:21 PM   I spent a total of 30 minutes at the the patient's bedside AND on the patient's hospital floor or unit, greater than 50% of which was counseling/coordinating care for CT/US guided cryoablation  of right renal cell carcinoma.

## 2023-02-15 NOTE — Anesthesia Preprocedure Evaluation (Signed)
Anesthesia Evaluation  Patient identified by MRN, date of birth, ID band Patient awake    Reviewed: Allergy & Precautions, NPO status , Patient's Chart, lab work & pertinent test results  History of Anesthesia Complications Negative for: history of anesthetic complications  Airway Mallampati: III  TM Distance: >3 FB Neck ROM: Full    Dental  (+) Dental Advisory Given, Missing   Pulmonary sleep apnea and Continuous Positive Airway Pressure Ventilation , Patient abstained from smoking., former smoker  [facility]    Pulmonary exam normal        Cardiovascular hypertension, Pt. on medications and Pt. on home beta blockers Normal cardiovascular exam   '24 TTE - EF 60 to 65%. TThere is mild concentric left ventricular hypertrophy. Trivial MR, mild AI.    Neuro/Psych  Headaches  Expressive aphasia   negative psych ROS   GI/Hepatic Neg liver ROS,GERD  Controlled and Medicated,,  Endo/Other    Morbid obesity  Renal/GU CRFRenal disease Renal mass      Musculoskeletal  (+) Arthritis ,    Abdominal  (+) + obese  Peds  Hematology  (+) Blood dyscrasia, anemia   Anesthesia Other Findings HSV  Reproductive/Obstetrics                             Anesthesia Physical Anesthesia Plan  ASA: 3  Anesthesia Plan: General   Post-op Pain Management: Tylenol PO (pre-op)*   Induction: Intravenous  PONV Risk Score and Plan: 2 and Treatment may vary due to age or medical condition, Ondansetron, Dexamethasone and Midazolam  Airway Management Planned: Oral ETT  Additional Equipment: None  Intra-op Plan:   Post-operative Plan: Extubation in OR  Informed Consent: I have reviewed the patients History and Physical, chart, labs and discussed the procedure including the risks, benefits and alternatives for the proposed anesthesia with the patient or authorized representative who has indicated his/her  understanding and acceptance.     Dental advisory given  Plan Discussed with: CRNA and Anesthesiologist  Anesthesia Plan Comments:         Anesthesia Quick Evaluation

## 2023-02-16 ENCOUNTER — Ambulatory Visit (HOSPITAL_BASED_OUTPATIENT_CLINIC_OR_DEPARTMENT_OTHER): Payer: 59 | Admitting: Certified Registered Nurse Anesthetist

## 2023-02-16 ENCOUNTER — Ambulatory Visit (HOSPITAL_COMMUNITY)
Admission: RE | Admit: 2023-02-16 | Discharge: 2023-02-16 | Disposition: A | Discharge status: Home / Self Care | Payer: 59 | Source: Ambulatory Visit | Attending: Diagnostic Radiology | Admitting: Diagnostic Radiology

## 2023-02-16 ENCOUNTER — Ambulatory Visit (HOSPITAL_COMMUNITY)
Admission: RE | Admit: 2023-02-16 | Discharge: 2023-02-16 | Disposition: A | Discharge status: Home / Self Care | Payer: 59 | Attending: Diagnostic Radiology | Admitting: Diagnostic Radiology

## 2023-02-16 ENCOUNTER — Ambulatory Visit (HOSPITAL_COMMUNITY): Payer: 59 | Admitting: Physician Assistant

## 2023-02-16 ENCOUNTER — Encounter (HOSPITAL_COMMUNITY)
Admission: RE | Disposition: A | Discharge status: Home / Self Care | Payer: Self-pay | Source: Home / Self Care | Attending: Diagnostic Radiology

## 2023-02-16 ENCOUNTER — Encounter (HOSPITAL_COMMUNITY): Payer: Self-pay | Admitting: Diagnostic Radiology

## 2023-02-16 ENCOUNTER — Other Ambulatory Visit: Payer: Self-pay

## 2023-02-16 DIAGNOSIS — N2889 Other specified disorders of kidney and ureter: Secondary | ICD-10-CM

## 2023-02-16 DIAGNOSIS — Z87891 Personal history of nicotine dependence: Secondary | ICD-10-CM

## 2023-02-16 DIAGNOSIS — I509 Heart failure, unspecified: Secondary | ICD-10-CM | POA: Diagnosis not present

## 2023-02-16 DIAGNOSIS — Z6841 Body Mass Index (BMI) 40.0 and over, adult: Secondary | ICD-10-CM | POA: Diagnosis not present

## 2023-02-16 DIAGNOSIS — N189 Chronic kidney disease, unspecified: Secondary | ICD-10-CM

## 2023-02-16 DIAGNOSIS — D631 Anemia in chronic kidney disease: Secondary | ICD-10-CM

## 2023-02-16 DIAGNOSIS — M1990 Unspecified osteoarthritis, unspecified site: Secondary | ICD-10-CM | POA: Insufficient documentation

## 2023-02-16 DIAGNOSIS — Z01818 Encounter for other preprocedural examination: Secondary | ICD-10-CM

## 2023-02-16 DIAGNOSIS — D630 Anemia in neoplastic disease: Secondary | ICD-10-CM

## 2023-02-16 DIAGNOSIS — G4733 Obstructive sleep apnea (adult) (pediatric): Secondary | ICD-10-CM | POA: Diagnosis not present

## 2023-02-16 DIAGNOSIS — C641 Malignant neoplasm of right kidney, except renal pelvis: Secondary | ICD-10-CM

## 2023-02-16 DIAGNOSIS — I129 Hypertensive chronic kidney disease with stage 1 through stage 4 chronic kidney disease, or unspecified chronic kidney disease: Secondary | ICD-10-CM | POA: Diagnosis not present

## 2023-02-16 DIAGNOSIS — I130 Hypertensive heart and chronic kidney disease with heart failure and stage 1 through stage 4 chronic kidney disease, or unspecified chronic kidney disease: Secondary | ICD-10-CM | POA: Insufficient documentation

## 2023-02-16 DIAGNOSIS — K219 Gastro-esophageal reflux disease without esophagitis: Secondary | ICD-10-CM | POA: Diagnosis not present

## 2023-02-16 DIAGNOSIS — R4701 Aphasia: Secondary | ICD-10-CM | POA: Insufficient documentation

## 2023-02-16 HISTORY — PX: RADIOLOGY WITH ANESTHESIA: SHX6223

## 2023-02-16 LAB — BASIC METABOLIC PANEL
Anion gap: 7 (ref 5–15)
BUN: 24 mg/dL — ABNORMAL HIGH (ref 6–20)
CO2: 23 mmol/L (ref 22–32)
Calcium: 8.6 mg/dL — ABNORMAL LOW (ref 8.9–10.3)
Chloride: 106 mmol/L (ref 98–111)
Creatinine, Ser: 1.55 mg/dL — ABNORMAL HIGH (ref 0.61–1.24)
GFR, Estimated: 57 mL/min — ABNORMAL LOW (ref >60)
Glucose, Bld: 101 mg/dL — ABNORMAL HIGH (ref 70–99)
Potassium: 3.9 mmol/L (ref 3.5–5.1)
Sodium: 136 mmol/L (ref 135–145)

## 2023-02-16 LAB — GLUCOSE, CAPILLARY: Glucose-Capillary: 121 mg/dL — ABNORMAL HIGH (ref 70–99)

## 2023-02-16 SURGERY — CT WITH ANESTHESIA
Anesthesia: General | Laterality: Right

## 2023-02-16 MED ORDER — SUGAMMADEX SODIUM 200 MG/2ML IV SOLN
INTRAVENOUS | Status: DC | PRN
  Administered 2023-02-16: 400 mg via INTRAVENOUS

## 2023-02-16 MED ORDER — SODIUM CHLORIDE (PF) 0.9 % IJ SOLN
INTRAMUSCULAR | Status: AC
  Filled 2023-02-16: qty 50

## 2023-02-16 MED ORDER — ACETAMINOPHEN 500 MG PO TABS
1000.0000 mg | ORAL_TABLET | Freq: Once | ORAL | Status: AC
  Administered 2023-02-16: 1000 mg via ORAL
  Filled 2023-02-16: qty 2

## 2023-02-16 MED ORDER — DEXAMETHASONE SODIUM PHOSPHATE 4 MG/ML IJ SOLN
INTRAMUSCULAR | Status: DC | PRN
  Administered 2023-02-16: 5 mg via INTRAVENOUS

## 2023-02-16 MED ORDER — ORAL CARE MOUTH RINSE: 15.0000 mL | Freq: Once | OROMUCOSAL | Status: AC

## 2023-02-16 MED ORDER — ROCURONIUM BROMIDE 10 MG/ML (PF) SYRINGE
PREFILLED_SYRINGE | INTRAVENOUS | Status: DC | PRN
  Administered 2023-02-16 (×3): 20 mg via INTRAVENOUS
  Administered 2023-02-16: 80 mg via INTRAVENOUS

## 2023-02-16 MED ORDER — SODIUM CHLORIDE 0.9 % IV SOLN
INTRAVENOUS | Status: AC
  Filled 2023-02-16: qty 20

## 2023-02-16 MED ORDER — SODIUM CHLORIDE 0.9 % IV SOLN
INTRAVENOUS | Status: AC
  Filled 2023-02-16: qty 250

## 2023-02-16 MED ORDER — FENTANYL CITRATE PF 50 MCG/ML IJ SOSY: 25.0000 ug | PREFILLED_SYRINGE | INTRAMUSCULAR | Status: DC | PRN

## 2023-02-16 MED ORDER — ONDANSETRON HCL 4 MG/2ML IJ SOLN
INTRAMUSCULAR | Status: DC | PRN
  Administered 2023-02-16: 4 mg via INTRAVENOUS

## 2023-02-16 MED ORDER — LACTATED RINGERS IV SOLN: INTRAVENOUS | Status: DC

## 2023-02-16 MED ORDER — FENTANYL CITRATE (PF) 100 MCG/2ML IJ SOLN
INTRAMUSCULAR | Status: DC | PRN
  Administered 2023-02-16 (×2): 50 ug via INTRAVENOUS
  Administered 2023-02-16: 100 ug via INTRAVENOUS
  Administered 2023-02-16: 50 ug via INTRAVENOUS

## 2023-02-16 MED ORDER — OXYCODONE HCL 5 MG/5ML PO SOLN: 5.0000 mg | Freq: Once | ORAL | Status: DC | PRN

## 2023-02-16 MED ORDER — LIDOCAINE 2% (20 MG/ML) 5 ML SYRINGE
INTRAMUSCULAR | Status: DC | PRN
  Administered 2023-02-16: 50 mg via INTRAVENOUS

## 2023-02-16 MED ORDER — OXYCODONE HCL 5 MG PO TABS: 5.0000 mg | ORAL_TABLET | Freq: Once | ORAL | Status: DC | PRN

## 2023-02-16 MED ORDER — HYDRALAZINE HCL 20 MG/ML IJ SOLN
INTRAMUSCULAR | Status: DC | PRN
  Administered 2023-02-16: 10 mg via INTRAVENOUS

## 2023-02-16 MED ORDER — PHENYLEPHRINE 80 MCG/ML (10ML) SYRINGE FOR IV PUSH (FOR BLOOD PRESSURE SUPPORT)
PREFILLED_SYRINGE | INTRAVENOUS | Status: DC | PRN
  Administered 2023-02-16: 80 ug via INTRAVENOUS

## 2023-02-16 MED ORDER — PROMETHAZINE HCL 25 MG/ML IJ SOLN: 6.2500 mg | INTRAMUSCULAR | Status: DC | PRN

## 2023-02-16 MED ORDER — CHLORHEXIDINE GLUCONATE 0.12% ORAL RINSE (MEDLINE KIT)
15.0000 mL | Freq: Once | OROMUCOSAL | Status: AC
  Administered 2023-02-16: 15 mL via OROMUCOSAL

## 2023-02-16 MED ORDER — FENTANYL CITRATE (PF) 250 MCG/5ML IJ SOLN
INTRAMUSCULAR | Status: AC
  Filled 2023-02-16: qty 5

## 2023-02-16 MED ORDER — MIDAZOLAM HCL 5 MG/5ML IJ SOLN
INTRAMUSCULAR | Status: DC | PRN
  Administered 2023-02-16: 2 mg via INTRAVENOUS

## 2023-02-16 MED ORDER — MIDAZOLAM HCL 2 MG/2ML IJ SOLN
INTRAMUSCULAR | Status: AC
  Filled 2023-02-16: qty 2

## 2023-02-16 MED ORDER — SODIUM CHLORIDE 0.9 % IV SOLN
2.0000 g | Freq: Once | INTRAVENOUS | Status: AC
  Administered 2023-02-16: 20 g via INTRAVENOUS

## 2023-02-16 MED ORDER — IOHEXOL 300 MG/ML  SOLN: 100.0000 mL | Freq: Once | INTRAMUSCULAR | Status: DC | PRN

## 2023-02-16 MED ORDER — PROPOFOL 10 MG/ML IV BOLUS
INTRAVENOUS | Status: DC | PRN
  Administered 2023-02-16: 200 mg via INTRAVENOUS

## 2023-02-16 NOTE — Progress Notes (Signed)
Took over care of patient at this time from Dayton Scrape, RN.

## 2023-02-16 NOTE — Anesthesia Postprocedure Evaluation (Signed)
Anesthesia Post Note  Patient: Calvin Bailey  Procedure(s) Performed: RIGHT RENAL MASS CRYO ABLATION (Right)     Patient location during evaluation: PACU Anesthesia Type: General Level of consciousness: awake and alert Pain management: pain level controlled Vital Signs Assessment: post-procedure vital signs reviewed and stable Respiratory status: spontaneous breathing, nonlabored ventilation and respiratory function stable Cardiovascular status: stable and blood pressure returned to baseline Anesthetic complications: no   No notable events documented.  Last Vitals:  Vitals:   02/16/23 1245 02/16/23 1300  BP: 127/80 110/60  Pulse: 68 70  Resp: 17 16  Temp:    SpO2: 94% 92%    Last Pain:  Vitals:   02/16/23 1300  PainSc: 0-No pain                 Beryle Lathe

## 2023-02-16 NOTE — Discharge Instructions (Addendum)
Cryoablation of Renal Tumors, Care After  This sheet gives you information about how to care for yourself after your procedure. Your health care provider may also give you more specific instructions. If you have problems or questions, contact your health care provider.  What can I expect after the procedure? After your procedure, it is common to have pain and discomfort in the upper abdomen/flank area. You may be given prescription pain medicines to control this if the pain is severe.   Follow these instructions at home: Puncture site care Follow instructions from your health care provider about how to take care of your incision. Make sure you:  - Wash your hands with soap and water before you change your bandage (dressing). If soap and water are not available, use hand sanitizer.  - Ok to shower 48 hours following procedure. Recommend showering with bandage on, remove bandage immediately after showering and pat area dry. No further dressing changes needed after this- ensure area remains clean and dry until fully healed.  - No submerging (swimming, bathing) for 7 days post-procedure. Check your puncture site area every day for signs of infection. Check for: - Redness, swelling, or pain. - Fluid or blood. - Warmth. - Pus or a bad smell. Activity Rest as often as needing during the first few days of recovery. No stooping, bending, or lifting more than 10 pounds for 1 week.  General instructions To prevent or treat constipation while you are taking prescription pain medicine, your health care provider may recommend that you: - Drink enough fluid to keep your urine clear or pale yellow. - Take over-the-counter or prescription medicines. - Eat foods that are high in fiber, such as fresh fruits and vegetables, whole grains, and beans. - Limit foods that are high in fat and processed sugars, such as fried and sweet foods. - Do not use any products that contain nicotine or tobacco, such as  cigarettes and e-cigarettes. If you need help quitting, ask your health care provider. - Keep all follow-up visits as told by your health care provider. This is important.  2-4 week televisit with the doctor who performed the procedure. Our office will call you to set up the appointment.   Contact a health care provider if: You cannot pass gas. You are unable to have a bowel movement within 3 days. You have a skin rash.  Get help right away if: You have a fever. You have severe or lasting pain in your abdomen, shoulder, or back. You have trouble swallowing or breathing. You have severe weakness or dizziness. You have chest pain or shortness of breath.   This information is not intended to replace advice given to you by your health care provider. Make sure you discuss any questions you have with your health care provider.

## 2023-02-16 NOTE — Transfer of Care (Signed)
Immediate Anesthesia Transfer of Care Note  Patient: Calvin Bailey  Procedure(s) Performed: RIGHT RENAL MASS CRYO ABLATION (Right)  Patient Location: PACU  Anesthesia Type:General  Level of Consciousness: awake and patient cooperative  Airway & Oxygen Therapy: Patient Spontanous Breathing and Patient connected to face mask  Post-op Assessment: Report given to RN and Post -op Vital signs reviewed and stable  Post vital signs: Reviewed and stable  Last Vitals:  Vitals Value Taken Time  BP 112/92 02/16/23 1230  Temp    Pulse 75 02/16/23 1234  Resp 15 02/16/23 1234  SpO2 94 % 02/16/23 1234  Vitals shown include unvalidated device data.  Last Pain:  Vitals:   02/16/23 0717  PainSc: 0-No pain         Complications: No notable events documented.

## 2023-02-16 NOTE — Anesthesia Procedure Notes (Signed)
Procedure Name: Intubation Date/Time: 02/16/2023 8:45 AM  Performed by: Vanessa Granada, CRNAPre-anesthesia Checklist: Emergency Drugs available, Suction available, Patient identified and Patient being monitored Patient Re-evaluated:Patient Re-evaluated prior to induction Oxygen Delivery Method: Circle system utilized Preoxygenation: Pre-oxygenation with 100% oxygen Induction Type: IV induction Ventilation: Mask ventilation with difficulty Laryngoscope Size: Glidescope and 4 Grade View: Grade I Tube type: Oral Tube size: 8.0 mm Number of attempts: 1 Airway Equipment and Method: Video-laryngoscopy Placement Confirmation: ETT inserted through vocal cords under direct vision, positive ETCO2 and breath sounds checked- equal and bilateral Secured at: 24 cm Tube secured with: Tape Dental Injury: Teeth and Oropharynx as per pre-operative assessment

## 2023-02-16 NOTE — Procedures (Signed)
Interventional Radiology Procedure:   Indications: Right renal papillary renal cell carcinoma  Procedure: CT guided cryoablation of right renal interpolar lesion  Findings: Two IceRod needles placed in right renal lesion with CT guidance.  Dilute contrast used for hydrodissection to move colon away from ablation site.  10 minute freeze - 8 minute minute thaw - 10 minute freeze performed.    Complications: None     EBL: Minimal  Plan: Recovery in PACU.  Anticipate discharge today in 3-4 hours.   Jahmai Finelli R. Lowella Dandy, MD  Pager: [phone]

## 2023-02-16 NOTE — Progress Notes (Signed)
Dayton Scrape, RN resumed care of patient.

## 2023-02-16 NOTE — Discharge Summary (Signed)
Patient ID: Calvin Bailey MRN: [id number] DOB/AGE: 1981/09/19 42 y.o.  Admit date: 02/16/2023 Discharge date: 02/16/2023  Supervising Physician: Richarda Overlie  Patient Status: [facility] - In-pt  Admission Diagnoses: right renal mass  Discharge Diagnoses:  Active Problems:   * No active hospital problems. *   Discharged Condition: good  Hospital Course:   Patient presented to [facility] IR for an image-guided right renal mass cryoablation with Dr. Lowella Dandy this morning.  Procedure occurred without major complications and patient was transferred to PACU in stable condition (VSS, right flank puncture site stable) for recovery.    Patient seen in PACU with Dr. Lowella Dandy. Patient awake and alert  no complaints at this time. Fiance and father at bedside. Foley catheter removed without difficulty, patient was able to void. Patient denies right flank pain or hematuria. right flank puncture site stable.   All questions answerer to satisfaction by Dr. Lowella Dandy. Emphasized importance of measuring blood pressure regularly and follow up with PCP for management. He was encouraged to call [facility] IR if he develops symptoms  and signs of bleeding such as fatigue, shortness of breath, lightheadedness, chest palpitations, right flank pain that does not respond to pain medicine and blood clots in urine.   Plan to discharge home today and follow-up with Dr. Lowella Dandy for follow up visit 3-4 weeks after discharge.  Consults: None  Significant Diagnostic Studies: none   Treatments: CT guided cryoablation   Discharge Exam: Blood pressure 110/60, pulse 70, temperature (!) 97.5 F (36.4 C), resp. rate 16, height 6\' 1"  (1.854 m), weight (!) 370 lb (167.8 kg), SpO2 92 %.  Physical Exam Vitals and nursing note reviewed.  Constitutional:      General: Patient is not in acute distress.    Appearance: Normal appearance. Patient is not ill-appearing.  HENT:     Head: Normocephalic and atraumatic.     Mouth/Throat:     Mouth: Mucous  membranes are moist.     Pharynx: Oropharynx is clear.  Cardiovascular:     Rate and Rhythm: Normal rate and regular rhythm.     Pulses: Normal pulses.     Heart sounds: Normal heart sounds.  Pulmonary:     Effort: Pulmonary effort is normal.     Breath sounds: Occasional inspiratory wheezing in LUL and RLL  Abdominal:     General: Abdomen is flat. Bowel sounds are normal.     Palpations: Abdomen is soft.  Musculoskeletal:     Cervical back: Neck supple.  Skin:    General: Skin is warm and dry.     Coloration: Skin is not jaundiced or pale.  Positive dressing on right flank puncture site. Site is unremarkable with no erythema, edema, tenderness, bleeding or drainage. Minimal amount of old, dry blood noted on the dressing. Dressing otherwise clean, dry, and intact.   Neurological:     Mental Status: Patient is alert and oriented to person, place, and time.  Psychiatric:        Mood and Affect: Mood normal.        Behavior: Behavior normal.        Judgment: Judgment normal.    Disposition:    Allergies as of 02/16/2023       Reactions   Chlorthalidone Nausea Only   Lisinopril Cough   Coughing up blood        Medication List     TAKE these medications    acetaminophen 500 MG tablet Commonly known as: TYLENOL Take 1 tablet (  500 mg total) by mouth every 6 (six) hours as needed.   amLODipine 10 MG tablet Commonly known as: NORVASC TAKE 1 TABLET(10 MG) BY MOUTH DAILY   colchicine 0.6 MG tablet Take 1 tablet (0.6 mg total) by mouth 2 (two) times daily. What changed:  when to take this reasons to take this   gabapentin 100 MG capsule Commonly known as: Neurontin Take 1 capsule (100 mg total) by mouth 3 (three) times daily for 14 days.   hydrALAZINE 100 MG tablet Commonly known as: APRESOLINE Take 1 tablet (100 mg total) by mouth 3 (three) times daily. What changed: when to take this   isosorbide mononitrate 30 MG 24 hr tablet Commonly known as: IMDUR Take 1  tablet (30 mg total) by mouth daily.   labetalol 300 MG tablet Commonly known as: NORMODYNE Take 1 tablet (300 mg total) by mouth 3 (three) times daily. What changed: when to take this   OVER THE COUNTER MEDICATION Take 15 mLs by mouth every other day. Soursop   spironolactone 25 MG tablet Commonly known as: ALDACTONE TAKE 1 TABLET(25 MG) BY MOUTH DAILY   valsartan 160 MG tablet Commonly known as: Diovan Take 1 tablet (160 mg total) by mouth 2 (two) times daily.        Follow-up Information     Richarda Overlie, MD Follow up.   Specialties: Interventional Radiology, Radiology Why: Follow up visit with Dr. Lowella Dandy. You will receive a call from our schedulers to set up the appointment. This can be done in person or via telephone. Contact information: [address], [address] [place] [address] [phone]                  Electronically Signed: Willette Brace, PA-C 02/16/2023, 3:23 PM   I have spent Less Than 30 Minutes discharging Calvin Bailey.

## 2023-02-17 ENCOUNTER — Encounter (HOSPITAL_COMMUNITY): Payer: Self-pay | Admitting: Diagnostic Radiology

## 2023-02-27 LAB — LAB REPORT - SCANNED
Albumin, Urine POC: 39.7
Albumin/Creatinine Ratio, Urine, POC: 19
Creatinine, POC: 206.1 mg/dL
EGFR: 50

## 2023-02-28 ENCOUNTER — Other Ambulatory Visit: Payer: Self-pay | Admitting: Cardiology

## 2023-03-02 ENCOUNTER — Other Ambulatory Visit: Payer: Self-pay | Admitting: Interventional Radiology

## 2023-03-02 ENCOUNTER — Ambulatory Visit
Admission: RE | Admit: 2023-03-02 | Discharge: 2023-03-02 | Disposition: A | Discharge status: Home / Self Care | Payer: 59 | Source: Ambulatory Visit | Attending: Student | Admitting: Student

## 2023-03-02 DIAGNOSIS — N2889 Other specified disorders of kidney and ureter: Secondary | ICD-10-CM

## 2023-03-02 DIAGNOSIS — M549 Dorsalgia, unspecified: Secondary | ICD-10-CM

## 2023-03-02 NOTE — Progress Notes (Signed)
Patient ID: OBI OGAZ, male   DOB: [birth year], 42 y.o.   MRN: [id number]       Chief Complaint: Patient was consulted remotely today (TeleHealth) for follow-up right renal lesion cryoablation  Referring Physician(s): Winter, C  History of Present Illness: Calvin Bailey is a 42 y.o. 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  Patient underwent technically successful image guided cryoablation of the papillary renal cell carcinoma on 02/16/2023.  Patient did very well immediately following the procedure and was discharged that same day.  Patient developed back pain approximately 3 days after the procedure.  He says the pain is the same location as the procedure. He rates the pain as 6 out of 10.  The pain goes away when he lies down.  He has been taking 800 mg of ibuprofen for the pain.  He says the Tylenol does not help with the pain.  He denies dysuria or hematuria.  He had one fever episode since the procedure. He was constipated for few days following the procedure but now he has regular bowel movements.  He has no other's symptoms at this time.  He went to a follow-up visit with his nephrologist since the cryoablation and had updated labs.  I do not have access to these labs but patient says that his renal function was good and that the nephrologist was happy with the results.  Patient is back to his regular activities and back to work.  Past Medical History:  Diagnosis Date   Arthritis    Cancer (HCC)    right kidney   CHF (congestive heart failure) (HCC)    pt. denies   Chronic kidney disease    Dizziness 09/25/2019   GERD (gastroesophageal reflux disease)    HTN (hypertension)    Obesity hypoventilation syndrome (HCC) 04/08/2014   OSA (obstructive sleep apnea) 04/08/2014   PFO (patent foramen ovale)    Pneumonia    Sleep apnea    wears Cpap    Past Surgical History:  Procedure Laterality Date   RADIOLOGY WITH ANESTHESIA Right 02/16/2023    Procedure: RIGHT RENAL MASS CRYO ABLATION;  Surgeon: Richarda Overlie, MD;  Location: [facility] ORS;  Service: Anesthesiology;  Laterality: Right;   two knee surgeries Left 1995    Allergies: Chlorthalidone and Lisinopril  Medications: Prior to Admission medications   Medication Sig Start Date End Date Taking? Authorizing Provider  acetaminophen (TYLENOL) 500 MG tablet Take 1 tablet (500 mg total) by mouth every 6 (six) hours as needed. 11/13/22   Arby Barrette, MD  amLODipine (NORVASC) 10 MG tablet TAKE 1 TABLET(10 MG) BY MOUTH DAILY 12/23/22   Tobb, Kardie, DO  colchicine 0.6 MG tablet Take 1 tablet (0.6 mg total) by mouth 2 (two) times daily. Patient taking differently: Take 0.6 mg by mouth daily as needed (gout). 12/08/22   Alyson Reedy, FNP  gabapentin (NEURONTIN) 100 MG capsule Take 1 capsule (100 mg total) by mouth 3 (three) times daily for 14 days. Patient not taking: Reported on 02/09/2023 01/15/23 01/29/23  Long, Arlyss Repress, MD  hydrALAZINE (APRESOLINE) 100 MG tablet Take 1 tablet (100 mg total) by mouth 3 (three) times daily. Patient taking differently: Take 100 mg by mouth 2 (two) times daily. 08/24/22   Chilton Si, MD  isosorbide mononitrate (IMDUR) 30 MG 24 hr tablet Take 1 tablet (30 mg total) by mouth daily. 11/22/22 02/20/23  Pokhrel, Rebekah Chesterfield, MD  labetalol (NORMODYNE) 300 MG tablet Take 1 tablet (300 mg  total) by mouth 3 (three) times daily. Patient taking differently: Take 300 mg by mouth 2 (two) times daily. 11/21/22   Pokhrel, Rebekah Chesterfield, MD  OVER THE COUNTER MEDICATION Take 15 mLs by mouth every other day. Soursop    [provider]  spironolactone (ALDACTONE) 25 MG tablet TAKE 1 TABLET(25 MG) BY MOUTH DAILY 03/01/23   Charlsie Quest, NP  valsartan (DIOVAN) 160 MG tablet Take 1 tablet (160 mg total) by mouth 2 (two) times daily. 07/01/22   Alver Sorrow, NP  LISINOPRIL-HYDROCHLOROTHIAZIDE PO Take by mouth.  07/06/19  [provider]     Family History  Problem  Relation Age of Onset   Hypertension Mother    Multiple sclerosis Mother    Hypertension Father    Hypertension Brother    Hyperlipidemia Paternal Grandfather    Heart failure Paternal Grandfather    Diabetes Maternal Grandmother    Heart failure Maternal Grandfather    CVA Maternal Uncle     Social History   Socioeconomic History   Marital status: Single    Spouse name: Not on file   Number of children: 2   Years of education: college   Highest education level: Some college, no degree  Occupational History   Occupation: unemployment    Associate Professor: UNEMPLOYED    Employer: [employer]  Tobacco Use   Smoking status: Former    Packs/day: 0.10    Years: 16.00    Additional pack years: 0.00    Total pack years: 1.60    Types: Cigarettes    Quit date: 06/08/2022    Years since quitting: 0.7   Smokeless tobacco: Never  Vaping Use   Vaping Use: Never used  Substance and Sexual Activity   Alcohol use: Not Currently    Comment: sparingly    Drug use: No   Sexual activity: Yes    Partners: Female  Other Topics Concern   Not on file  Social History Narrative   Corporate investment banker   Patient is single and lives alone.   Patient is left-handed.   Patient does not drink any caffeine.   Social Determinants of Health   Financial Resource Strain: Low Risk  (07/01/2022)   Overall Financial Resource Strain (CARDIA)    Difficulty of Paying Living Expenses: Not hard at all  Food Insecurity: No Food Insecurity (11/20/2022)   Hunger Vital Sign    Worried About Running Out of Food in the Last Year: Never true    Ran Out of Food in the Last Year: Never true  Transportation Needs: No Transportation Needs (11/20/2022)   PRAPARE - Administrator, Civil Service (Medical): No    Lack of Transportation (Non-Medical): No  Physical Activity: Insufficiently Active (07/01/2022)   Exercise Vital Sign    Days of Exercise per Week: 3 days    Minutes of Exercise per Session: 30 min   Stress: No Stress Concern Present (07/01/2022)   [employer] of Occupational Health - Occupational Stress Questionnaire    Feeling of Stress : Only a little  Social Connections: Socially Isolated (07/01/2022)   Social Connection and Isolation Panel [NHANES]    Frequency of Communication with Friends and Family: Once a week    Frequency of Social Gatherings with Friends and Family: Once a week    Attends Religious Services: 1 to 4 times per year    Active Member of [employer] or Organizations: No    Attends Banker Meetings: Never    Marital  Status: Never married    ECOG Status: 1 - Symptomatic but completely ambulatory  Review of Systems  Constitutional: Negative.   Respiratory: Negative.    Cardiovascular: Negative.   Genitourinary:  Positive for flank pain. Negative for dysuria and hematuria.  Musculoskeletal:  Positive for back pain.    Physical Exam No direct physical exam was performed Vital Signs: There were no vitals taken for this visit.  Imaging: CT GUIDE TISSUE ABLATION  Result Date: 02/16/2023 INDICATION: 42 year old with biopsy-proven papillary renal cell carcinoma involving the right kidney. EXAM: CT-GUIDED CRYOABLATION OF RIGHT RENAL LESION COMPARISON:  11/13/2022 MEDICATIONS: Rocephin 2 g ANESTHESIA/SEDATION: General - as administered by the Anesthesia department FLUOROSCOPY: No fluoroscopy COMPLICATIONS: None immediate. TECHNIQUE: Informed written consent was obtained from the patient after a thorough discussion of the procedural risks, benefits and alternatives. All questions were addressed. Maximal a timeout was performed prior to the initiation of the procedure. Patient was intubated. Patient was placed prone. CT images of the abdomen were obtained. The right side of the back was prepped with chlorhexidine and sterile field was created. Using CT guidance, two spinal needles were placed in order to plan the trajectory for the cryoablation needles. Two  small incisions were made. Two IceRod cryoablation needles were used. First needle was placed along the superior aspect of the lesion. Second cryoablation needle was placed along the inferior margin of the lesion. Multiple CT images were obtained to confirm needle placement. Both needles were advanced just beyond the lesion. 22 gauge needle was placed between the cryoablation needles and directed between the lateral aspect of the right kidney and the colon. Very dilute contrast was injected for hydro dissection. Approximately 300 mL of hydro dissection was used to displace the colon anterior from the cryoablation needles. An adequate space was obtained between the right colon and the ablation site. Cryoablation was performed with a 10 minute freeze followed by an 8 minute thaw followed by another 10 minute freeze. After the final thaw, the hydro dissection needle was removed and cauterization was performed in both ablation needles. Both needles removed without complication. Bandage placed over the puncture site. FINDINGS: Again noted is mildly exophytic lesion along the lateral right kidney interpolar region. Lesion measured up to 2.7 cm. Two cryoablation needles were placed within the lesion and the colon was displaced anteriorly using hydro dissection. Ice ball appeared to be sufficiently covering the lesion. Expected changes following cryoablation on the final images. Contrast within the renal collecting systems without hydronephrosis. Patient has a known indeterminate lesion in the right kidney upper pole. This lesion measures approximately 3.1 x 3.2 cm. Pre contrast Hounsfield units were around 50. Hounsfield units do not significantly change during the arterial and venous phase of contrast. Hounsfield units measured close to 59 on the portal venous phase images. Lesion remains indeterminate but suspect this could represent hemorrhagic or proteinaceous cyst. IMPRESSION: 1. Successful CT-guided cryoablation of  the right renal interpolar lesion. 2. Hyperdense lesion in the right kidney upper pole does not have significant contrast enhancement. This lesion remains indeterminate but suspect it could represent a proteinaceous or hemorrhagic cyst. Will continue surveillance of this lesion. Electronically Signed   By: Richarda Overlie M.D.   On: 02/16/2023 16:33    Labs:  CBC: Recent Labs    11/23/22 1355 12/07/22 0656 01/09/23 1435 02/11/23 0842  WBC 11.1* 8.4 10.8* 7.8  HGB 13.5 13.3 12.6* 12.9*  HCT 41.0 39.7 37.8* 39.7  PLT 299 281 280 235  COAGS: Recent Labs    12/07/22 0656 02/11/23 0842  INR 1.1 1.1    BMP: Recent Labs    11/21/22 0435 11/23/22 1355 01/09/23 1435 02/11/23 0842 02/16/23 0815  NA 134* 140 135 137 136  K 4.1 4.4 4.3 4.2 3.9  CL 103 106 104 107 106  CO2 24 20 22 23 23   GLUCOSE 93 146* 95 101* 101*  BUN 32* 25* 23* 17 24*  CALCIUM 9.0 8.8 9.4 8.8* 8.6*  CREATININE 1.73* 1.96* 1.72* 1.48* 1.55*  GFRNONAA 50*  --  50* 60* 57*    LIVER FUNCTION TESTS: Recent Labs    06/26/22 1729 11/13/22 0919 11/20/22 0536 01/09/23 1435  BILITOT 0.6 0.6 0.5 0.5  AST 14* 14* 16 12*  ALT 23 16 18 17   ALKPHOS 79 70 68 81  PROT 8.2* 7.0 7.8 7.5  ALBUMIN 4.8 3.7 4.1 4.2    TUMOR MARKERS: No results for input(s): "AFPTM", "CEA", "CA199", "CHROMGRNA" in the last 8760 hours.  Assessment and Plan:  42 year old male with history of a right kidney papillary renal cell carcinoma that was treated with cryoablation on 02/16/2023.  The procedure was technically successful but the patient has developed moderate back pain at the treatment site approximately 3 days after the procedure.  The pain goes away when he lies down and pain is being managed with ibuprofen.  It is reassuring that the patient does not have hematuria, dysuria or bowel issues.  We did have to perform significant hydrodissection to displace the colon during the procedure.  Based on the patient's symptoms, it does not  appear that he has a bowel abnormality.  He had 1 episode of fever since the procedure but does not appear to be infected at this time.  However, would like to exclude a post ablation complication such as a hematoma or possibly infection.  Will order a noncontrast CT of the abdomen and pelvis to evaluate the post procedure back pain.  Depending on the results of the CT, I may need to examine the patient to evaluate his symptoms.  With regards to his treated renal cell carcinoma, the patient will need routine follow-up the cryoablation site but also the indeterminate lesion in the right kidney upper pole.  The previous MR study was limited due to patient's difficulty holding his breath.  The right upper pole lesion was better characterized on the ablation CT images when we gave the patient contrast and did multiphase imaging.  Will plan for a CT of the abdomen with contrast using multiphase technique in 3 to 6 months.  I will contact the patient after I get the results of the noncontrast CT to evaluate his post procedural back pain.     Electronically Signed: Arn Medal 03/02/2023, 8:43 AM   I spent a total of    10 Minutes in remote  clinical consultation, greater than 50% of which was counseling/coordinating care for treated renal cell carcinoma.    Visit type: Audio only (telephone). Audio (no video) only due to patient preference. Alternative for in-person consultation at [facility], [address] [place], [place], [place].

## 2023-03-03 ENCOUNTER — Telehealth: Payer: Self-pay | Admitting: Cardiology

## 2023-03-03 ENCOUNTER — Ambulatory Visit
Admission: RE | Admit: 2023-03-03 | Discharge: 2023-03-03 | Disposition: A | Discharge status: Home / Self Care | Payer: 59 | Source: Ambulatory Visit | Attending: Interventional Radiology | Admitting: Interventional Radiology

## 2023-03-03 DIAGNOSIS — N2889 Other specified disorders of kidney and ureter: Secondary | ICD-10-CM

## 2023-03-03 DIAGNOSIS — M549 Dorsalgia, unspecified: Secondary | ICD-10-CM

## 2023-03-03 NOTE — Telephone Encounter (Signed)
Pt's medication was already sent to pt's pharmacy as requested. Confirmation received.  

## 2023-03-03 NOTE — Telephone Encounter (Signed)
*  STAT* If patient is at the pharmacy, call can be transferred to refill team.   1. Which medications need to be refilled? (please list name of each medication and dose if known) amLODipine (NORVASC) 10 MG tablet  2. Which pharmacy/location (including street and city if local pharmacy) is medication to be sent to? [id number] - [place], [place] - [address] AT [facility] OF [address]   3. Do they need a 30 day or 90 day supply?  30 day supply

## 2023-03-04 ENCOUNTER — Telehealth: Payer: Self-pay | Admitting: Diagnostic Radiology

## 2023-03-04 NOTE — Telephone Encounter (Signed)
Called patient to review recent CT exam.  CT was ordered to evaluate post ablation back pain. CT demonstrated expected post ablation changes in right kidney.  No abnormality in the adjacent colon or along the right posterior abdominal wall.  Discussed findings with patient.  He said that the flank/back pain has started to decrease.  I suspect the pain will continue to decrease over time.  Plan for follow up CT in 6 months to evaluate the ablation site and the other right renal lesion.  Patient will contact us if he has any questions or concerns.

## 2023-03-10 ENCOUNTER — Ambulatory Visit (HOSPITAL_BASED_OUTPATIENT_CLINIC_OR_DEPARTMENT_OTHER): Payer: 59 | Admitting: Family Medicine

## 2023-03-10 ENCOUNTER — Encounter (HOSPITAL_BASED_OUTPATIENT_CLINIC_OR_DEPARTMENT_OTHER): Payer: Self-pay | Admitting: Family Medicine

## 2023-03-10 ENCOUNTER — Other Ambulatory Visit (HOSPITAL_BASED_OUTPATIENT_CLINIC_OR_DEPARTMENT_OTHER): Payer: Self-pay | Admitting: Family Medicine

## 2023-03-10 VITALS — BP 141/79 | HR 73 | Ht 73.0 in | Wt 372.7 lb

## 2023-03-10 DIAGNOSIS — R42 Dizziness and giddiness: Secondary | ICD-10-CM | POA: Diagnosis not present

## 2023-03-10 DIAGNOSIS — M109 Gout, unspecified: Secondary | ICD-10-CM

## 2023-03-10 MED ORDER — ALLOPURINOL 100 MG PO TABS
100.0000 mg | ORAL_TABLET | Freq: Every day | ORAL | 1 refills | Status: DC
Start: 2023-03-10 — End: 2023-03-10

## 2023-03-10 MED ORDER — COLCHICINE 0.6 MG PO TABS
0.6000 mg | ORAL_TABLET | Freq: Two times a day (BID) | ORAL | 0 refills | Status: DC
Start: 2023-03-10 — End: 2024-06-12

## 2023-03-10 NOTE — Progress Notes (Signed)
    Procedures performed today:    None.  Independent interpretation of notes and tests performed by another provider:   None.  Brief History, Exam, Impression, and Recommendations:    BP (!) 141/79 (BP Location: Left Arm, Patient Position: Sitting, Cuff Size: Large)   Pulse 73   Ht 6\' 1"  (1.854 m)   Wt (!) 372 lb 11.2 oz (169.1 kg)   SpO2 99% Comment: RA  BMI 49.17 kg/m   Dizziness Assessment & Plan: Patient reports issues with fogginess, dizziness, difficulty concentrating.  This is something that has been going on for months, however over the past month or so he has been more aware of symptoms.  More recently he will occasionally feel as though the room is spinning.  Reports that usually first thing in the morning he will feel somewhat foggy for the first few hours.  Sometimes feels slightly dizzy when getting out of bed No change in hearing No difficulty with walking, no falls No nausea, vomiting, no headaches Some blurry vision, no double vision. Blurry vision will improve as the day goes on. No numbness, no weakness. No change in coordination. No syncopal episodes. Has been checking BP - 130/70 at home; has not checked BP during episodes. Has had some increased fatigue. Does have diagnosis of sleep apnea, uses CPAP. On exam, patient is in no acute distress, vital signs stable.  Cardiovascular exam with regular rate and rhythm, no murmur appreciated.  Lungs clear to auscultation bilaterally.  Extraocular movements intact, normal pupillary reflexes bilaterally to light and accommodation.  Normal gait in office. Uncertain etiology for recent symptoms.  We discussed considerations including medication side effect, suboptimally treated sleep apnea (possibly needing new CPAP settings), metabolic abnormality.  Feel less likely that this is related to central cause or cardiovascular cause of symptoms.  Can proceed with initial laboratory evaluation today.  Consider discussing with sleep  medicine provider to ensure no adjustments are needed to CPAP therapy. He does have follow-up with cardiology upcoming  Orders: -     Hemoglobin A1c -     TSH Rfx on Abnormal to Free T4 -     Comprehensive metabolic panel -     CBC with Differential/Platelet  Gout, unspecified cause, unspecified chronicity, unspecified site Assessment & Plan: Flares about once every other week. Has been taking colchicine PRN Uric acid today - no prior labs completed Start allopurinol at low-dose of 100 mg daily. Follow-up in 1 month; recheck uric acid at next visit  Orders: -     Uric acid  Acute gout involving toe of right foot, unspecified cause Assessment & Plan: Flares about once every other week. Has been taking colchicine PRN Uric acid today - no prior labs completed Start allopurinol at low-dose of 100 mg daily. Follow-up in 1 month; recheck uric acid at next visit  Orders: -     Colchicine; Take 1 tablet (0.6 mg total) by mouth 2 (two) times daily.  Dispense: 60 tablet; Refill: 0  Other orders -     T4F  Return in about 4 weeks (around 04/07/2023) for med check.   ___________________________________________ Ashlyne Olenick de [place], MD, ABFM, CAQSM Primary Care and Sports Medicine [facility]

## 2023-03-10 NOTE — Patient Instructions (Signed)
  Medication Instructions:  Your physician recommends that you continue on your current medications as directed. Please refer to the Current Medication list given to you today. --If you need a refill on any your medications before your next appointment, please call your pharmacy first. If no refills are authorized on file call the office.-- Lab Work: Your physician has recommended that you have lab work today: Yes If you have labs (blood work) drawn today and your tests are completely normal, you will receive your results via MyChart message OR a phone call from our staff.  Please ensure you check your voicemail in the event that you authorized detailed messages to be left on a delegated number. If you have any lab test that is abnormal or we need to change your treatment, we will call you to review the results.  Referrals/Procedures/Imaging: No  Follow-Up: Your next appointment:   Your physician recommends that you schedule a follow-up appointment in: 1 month with Dr. de [place].  You will receive a text message or e-mail with a link to a survey about your care and experience with us today! We would greatly appreciate your feedback!   Thanks for letting us be apart of your health journey!!  Primary Care and Sports Medicine   Dr. Raymond de Cuba   We encourage you to activate your patient portal called "MyChart".  Sign up information is provided on this After Visit Summary.  MyChart is used to connect with patients for Virtual Visits (Telemedicine).  Patients are able to view lab/test results, encounter notes, upcoming appointments, etc.  Non-urgent messages can be sent to your provider as well. To learn more about what you can do with MyChart, please visit --  [email or web address].    

## 2023-03-11 LAB — CBC WITH DIFFERENTIAL/PLATELET
Basophils Absolute: 0.1 10*3/uL (ref 0.0–0.2)
Basos: 1 %
EOS (ABSOLUTE): 0.3 10*3/uL (ref 0.0–0.4)
Eos: 3 %
Hematocrit: 37.8 % (ref 37.5–51.0)
Hemoglobin: 12.2 g/dL — ABNORMAL LOW (ref 13.0–17.7)
Immature Grans (Abs): 0 10*3/uL (ref 0.0–0.1)
Immature Granulocytes: 0 %
Lymphocytes Absolute: 1.7 10*3/uL (ref 0.7–3.1)
Lymphs: 18 %
MCH: 27.4 pg (ref 26.6–33.0)
MCHC: 32.3 g/dL (ref 31.5–35.7)
MCV: 85 fL (ref 79–97)
Monocytes Absolute: 0.9 10*3/uL (ref 0.1–0.9)
Monocytes: 10 %
Neutrophils Absolute: 6.4 10*3/uL (ref 1.4–7.0)
Neutrophils: 68 %
Platelets: 349 10*3/uL (ref 150–450)
RBC: 4.45 x10E6/uL (ref 4.14–5.80)
RDW: 12.9 % (ref 11.6–15.4)
WBC: 9.3 10*3/uL (ref 3.4–10.8)

## 2023-03-11 LAB — COMPREHENSIVE METABOLIC PANEL
ALT: 12 IU/L (ref 0–44)
AST: 10 IU/L (ref 0–40)
Albumin/Globulin Ratio: 1.2
Albumin: 4.1 g/dL (ref 4.1–5.1)
Alkaline Phosphatase: 118 IU/L (ref 44–121)
BUN/Creatinine Ratio: 16 (ref 9–20)
BUN: 25 mg/dL — ABNORMAL HIGH (ref 6–24)
Bilirubin Total: <0.2 mg/dL (ref 0.0–1.2)
CO2: 21 mmol/L (ref 20–29)
Calcium: 9.4 mg/dL (ref 8.7–10.2)
Chloride: 105 mmol/L (ref 96–106)
Creatinine, Ser: 1.59 mg/dL — ABNORMAL HIGH (ref 0.76–1.27)
Globulin, Total: 3.5 g/dL (ref 1.5–4.5)
Glucose: 92 mg/dL (ref 70–99)
Potassium: 4.5 mmol/L (ref 3.5–5.2)
Sodium: 139 mmol/L (ref 134–144)
Total Protein: 7.6 g/dL (ref 6.0–8.5)
eGFR: 55 mL/min/{1.73_m2} — ABNORMAL LOW (ref >59)

## 2023-03-11 LAB — HEMOGLOBIN A1C
Est. average glucose Bld gHb Est-mCnc: 111 mg/dL
Hgb A1c MFr Bld: 5.5 % (ref 4.8–5.6)

## 2023-03-11 LAB — T4F: T4,Free (Direct): 1.13 ng/dL (ref 0.82–1.77)

## 2023-03-11 LAB — URIC ACID: Uric Acid: 8.6 mg/dL — ABNORMAL HIGH (ref 3.8–8.4)

## 2023-03-11 LAB — TSH RFX ON ABNORMAL TO FREE T4: TSH: 0.296 u[IU]/mL — ABNORMAL LOW (ref 0.450–4.500)

## 2023-03-11 NOTE — Assessment & Plan Note (Signed)
Flares about once every other week. Has been taking colchicine PRN Uric acid today - no prior labs completed Start allopurinol at low-dose of 100 mg daily. Follow-up in 1 month; recheck uric acid at next visit

## 2023-03-11 NOTE — Assessment & Plan Note (Signed)
Patient reports issues with fogginess, dizziness, difficulty concentrating.  This is something that has been going on for months, however over the past month or so he has been more aware of symptoms.  More recently he will occasionally feel as though the room is spinning.  Reports that usually first thing in the morning he will feel somewhat foggy for the first few hours.  Sometimes feels slightly dizzy when getting out of bed No change in hearing No difficulty with walking, no falls No nausea, vomiting, no headaches Some blurry vision, no double vision. Blurry vision will improve as the day goes on. No numbness, no weakness. No change in coordination. No syncopal episodes. Has been checking BP - 130/70 at home; has not checked BP during episodes. Has had some increased fatigue. Does have diagnosis of sleep apnea, uses CPAP. On exam, patient is in no acute distress, vital signs stable.  Cardiovascular exam with regular rate and rhythm, no murmur appreciated.  Lungs clear to auscultation bilaterally.  Extraocular movements intact, normal pupillary reflexes bilaterally to light and accommodation.  Normal gait in office. Uncertain etiology for recent symptoms.  We discussed considerations including medication side effect, suboptimally treated sleep apnea (possibly needing new CPAP settings), metabolic abnormality.  Feel less likely that this is related to central cause or cardiovascular cause of symptoms.  Can proceed with initial laboratory evaluation today.  Consider discussing with sleep medicine provider to ensure no adjustments are needed to CPAP therapy. He does have follow-up with cardiology upcoming

## 2023-03-22 ENCOUNTER — Ambulatory Visit: Payer: 59 | Attending: Cardiology | Admitting: Cardiology

## 2023-03-23 ENCOUNTER — Telehealth: Payer: Self-pay | Admitting: Cardiology

## 2023-03-23 NOTE — Telephone Encounter (Signed)
Patient would like to speak with PharmD to discuss going back on [facility].

## 2023-03-29 ENCOUNTER — Telehealth: Payer: Self-pay

## 2023-03-29 ENCOUNTER — Other Ambulatory Visit (HOSPITAL_COMMUNITY): Payer: Self-pay

## 2023-03-29 NOTE — Telephone Encounter (Signed)
Pharmacy Patient Advocate Encounter  Prior Authorization for [facility] has been APPROVED by [facility] from 7.2.24 to 7.2.25.

## 2023-03-29 NOTE — Telephone Encounter (Signed)
Patient previously tried [place], quit 2/2 N/V.  Would like to try again if still covered on insurance.   Could you please do PA

## 2023-03-29 NOTE — Telephone Encounter (Signed)
Pharmacy Patient Advocate Encounter   Received notification from [employer] that prior authorization for [facility] is required/requested.   PA submitted to [facility] via CoverMyMeds [employer] or [facility]) confirmation # [id number]  Status is pending

## 2023-04-04 ENCOUNTER — Other Ambulatory Visit (HOSPITAL_BASED_OUTPATIENT_CLINIC_OR_DEPARTMENT_OTHER): Payer: Self-pay

## 2023-04-04 MED ORDER — WEGOVY 1 MG/0.5ML ~~LOC~~ SOAJ
1.0000 mg | SUBCUTANEOUS | 0 refills | Status: DC
  Filled 2023-04-04 – 2023-07-01 (×2): qty 2, 28d supply, fill #0

## 2023-04-04 MED ORDER — WEGOVY 0.5 MG/0.5ML ~~LOC~~ SOAJ
0.5000 mg | SUBCUTANEOUS | 0 refills | Status: DC
  Filled 2023-04-04 – 2023-05-02 (×2): qty 2, 28d supply, fill #0

## 2023-04-04 MED ORDER — WEGOVY 0.25 MG/0.5ML ~~LOC~~ SOAJ: 0.2500 mg | SUBCUTANEOUS | 0 refills | Status: DC

## 2023-04-04 NOTE — Telephone Encounter (Signed)
See other encounter.

## 2023-04-04 NOTE — Addendum Note (Signed)
Addended by: Rosalee Kaufman on: 04/04/2023 04:18 PM   Modules accepted: Orders

## 2023-04-11 ENCOUNTER — Other Ambulatory Visit (HOSPITAL_BASED_OUTPATIENT_CLINIC_OR_DEPARTMENT_OTHER): Payer: Self-pay

## 2023-04-22 ENCOUNTER — Ambulatory Visit (HOSPITAL_BASED_OUTPATIENT_CLINIC_OR_DEPARTMENT_OTHER): Payer: 59 | Admitting: Family Medicine

## 2023-05-02 ENCOUNTER — Other Ambulatory Visit (HOSPITAL_BASED_OUTPATIENT_CLINIC_OR_DEPARTMENT_OTHER): Payer: Self-pay

## 2023-05-20 ENCOUNTER — Emergency Department (HOSPITAL_BASED_OUTPATIENT_CLINIC_OR_DEPARTMENT_OTHER): Payer: 59

## 2023-05-20 ENCOUNTER — Other Ambulatory Visit: Payer: Self-pay | Admitting: Diagnostic Radiology

## 2023-05-20 ENCOUNTER — Emergency Department (HOSPITAL_BASED_OUTPATIENT_CLINIC_OR_DEPARTMENT_OTHER)
Admission: EM | Admit: 2023-05-20 | Discharge: 2023-05-20 | Disposition: A | Discharge status: Home / Self Care | Payer: 59 | Attending: Emergency Medicine | Admitting: Emergency Medicine

## 2023-05-20 ENCOUNTER — Other Ambulatory Visit: Payer: Self-pay

## 2023-05-20 DIAGNOSIS — Z85528 Personal history of other malignant neoplasm of kidney: Secondary | ICD-10-CM | POA: Insufficient documentation

## 2023-05-20 DIAGNOSIS — N189 Chronic kidney disease, unspecified: Secondary | ICD-10-CM | POA: Diagnosis not present

## 2023-05-20 DIAGNOSIS — I1310 Hypertensive heart and chronic kidney disease without heart failure, with stage 1 through stage 4 chronic kidney disease, or unspecified chronic kidney disease: Secondary | ICD-10-CM | POA: Insufficient documentation

## 2023-05-20 DIAGNOSIS — R1032 Left lower quadrant pain: Secondary | ICD-10-CM | POA: Diagnosis present

## 2023-05-20 DIAGNOSIS — M549 Dorsalgia, unspecified: Secondary | ICD-10-CM | POA: Insufficient documentation

## 2023-05-20 DIAGNOSIS — N2889 Other specified disorders of kidney and ureter: Secondary | ICD-10-CM | POA: Insufficient documentation

## 2023-05-20 DIAGNOSIS — I509 Heart failure, unspecified: Secondary | ICD-10-CM | POA: Insufficient documentation

## 2023-05-20 DIAGNOSIS — C641 Malignant neoplasm of right kidney, except renal pelvis: Secondary | ICD-10-CM

## 2023-05-20 LAB — URINALYSIS, ROUTINE W REFLEX MICROSCOPIC
Bilirubin Urine: NEGATIVE
Glucose, UA: NEGATIVE mg/dL
Hgb urine dipstick: NEGATIVE
Ketones, ur: NEGATIVE mg/dL
Leukocytes,Ua: NEGATIVE
Nitrite: NEGATIVE
Protein, ur: NEGATIVE mg/dL
Specific Gravity, Urine: 1.016 (ref 1.005–1.030)
pH: 5.5 (ref 5.0–8.0)

## 2023-05-20 LAB — COMPREHENSIVE METABOLIC PANEL
ALT: 13 U/L (ref 0–44)
AST: 14 U/L — ABNORMAL LOW (ref 15–41)
Albumin: 4 g/dL (ref 3.5–5.0)
Alkaline Phosphatase: 77 U/L (ref 38–126)
Anion gap: 9 (ref 5–15)
BUN: 20 mg/dL (ref 6–20)
CO2: 22 mmol/L (ref 22–32)
Calcium: 8.9 mg/dL (ref 8.9–10.3)
Chloride: 107 mmol/L (ref 98–111)
Creatinine, Ser: 1.74 mg/dL — ABNORMAL HIGH (ref 0.61–1.24)
GFR, Estimated: 49 mL/min — ABNORMAL LOW (ref >60)
Glucose, Bld: 105 mg/dL — ABNORMAL HIGH (ref 70–99)
Potassium: 4.3 mmol/L (ref 3.5–5.1)
Sodium: 138 mmol/L (ref 135–145)
Total Bilirubin: 0.2 mg/dL — ABNORMAL LOW (ref 0.3–1.2)
Total Protein: 7.6 g/dL (ref 6.5–8.1)

## 2023-05-20 LAB — LIPASE, BLOOD: Lipase: 50 U/L (ref 11–51)

## 2023-05-20 LAB — CBC
HCT: 39.2 % (ref 39.0–52.0)
Hemoglobin: 13 g/dL (ref 13.0–17.0)
MCH: 28 pg (ref 26.0–34.0)
MCHC: 33.2 g/dL (ref 30.0–36.0)
MCV: 84.5 fL (ref 80.0–100.0)
Platelets: 303 10*3/uL (ref 150–400)
RBC: 4.64 MIL/uL (ref 4.22–5.81)
RDW: 13.1 % (ref 11.5–15.5)
WBC: 9 10*3/uL (ref 4.0–10.5)
nRBC: 0 % (ref 0.0–0.2)

## 2023-05-20 MED ORDER — ACETAMINOPHEN 500 MG PO TABS: 500.0000 mg | ORAL_TABLET | Freq: Four times a day (QID) | ORAL | 0 refills | Status: AC | PRN

## 2023-05-20 MED ORDER — ONDANSETRON HCL 4 MG/2ML IJ SOLN
4.0000 mg | Freq: Once | INTRAMUSCULAR | Status: AC
  Administered 2023-05-20: 4 mg via INTRAVENOUS
  Filled 2023-05-20: qty 2

## 2023-05-20 MED ORDER — MORPHINE SULFATE (PF) 4 MG/ML IV SOLN
4.0000 mg | Freq: Once | INTRAVENOUS | Status: AC
  Administered 2023-05-20: 4 mg via INTRAVENOUS
  Filled 2023-05-20: qty 1

## 2023-05-20 NOTE — ED Triage Notes (Signed)
Pt states that he has had intermittent LLQ pain x 10 days. Pt denies n/v/d.  Denies urinary symptoms

## 2023-05-20 NOTE — ED Provider Notes (Signed)
[place] EMERGENCY DEPARTMENT AT [facility] Provider Note   CSN: [id number] Arrival date & time: 05/20/23  8756     History {Add pertinent medical, surgical, social history, OB history to HPI:1} Chief Complaint  Patient presents with   Abdominal Pain    Calvin Bailey is a 42 y.o. male.  HPI      This is a 42 year old male who presents with left lower quadrant pain.  Patient reports he has had pain for the last 7 to 10 days on and off.  He states sometimes he gets sharp and will last for an hour or 2.  When it goes away he still has a dull pain.  It is mostly in the left lower quadrant.  He has had some back pain.  No nausea, vomiting, change in stools.  No fevers.  No hematuria or dysuria. Home Medications Prior to Admission medications   Medication Sig Start Date End Date Taking? Authorizing Provider  acetaminophen (TYLENOL) 500 MG tablet Take 1 tablet (500 mg total) by mouth every 6 (six) hours as needed. 11/13/22   Arby Barrette, MD  allopurinol (ZYLOPRIM) 100 MG tablet TAKE 1 TABLET(100 MG) BY MOUTH DAILY 03/10/23   de [place], Buren Kos, MD  amLODipine (NORVASC) 10 MG tablet TAKE 1 TABLET(10 MG) BY MOUTH DAILY 12/23/22   Tobb, Kardie, DO  colchicine 0.6 MG tablet Take 1 tablet (0.6 mg total) by mouth 2 (two) times daily. 03/10/23   de [place], Buren Kos, MD  gabapentin (NEURONTIN) 100 MG capsule Take 1 capsule (100 mg total) by mouth 3 (three) times daily for 14 days. Patient not taking: Reported on 02/09/2023 01/15/23 01/29/23  Long, Arlyss Repress, MD  hydrALAZINE (APRESOLINE) 100 MG tablet Take 1 tablet (100 mg total) by mouth 3 (three) times daily. Patient taking differently: Take 100 mg by mouth 2 (two) times daily. 08/24/22   Chilton Si, MD  isosorbide mononitrate (IMDUR) 30 MG 24 hr tablet Take 1 tablet (30 mg total) by mouth daily. 11/22/22 02/20/23  Pokhrel, Rebekah Chesterfield, MD  labetalol (NORMODYNE) 300 MG tablet Take 1 tablet (300 mg total) by mouth 3 (three) times  daily. Patient taking differently: Take 300 mg by mouth 2 (two) times daily. 11/21/22   Pokhrel, Rebekah Chesterfield, MD  OVER THE COUNTER MEDICATION Take 15 mLs by mouth every other day. [place]    [provider]  Semaglutide-Weight Management (WEGOVY) 0.25 MG/0.5ML SOAJ Inject 0.25 mg into the skin every 7 (seven) days. 04/04/23   Tobb, Kardie, DO  Semaglutide-Weight Management (WEGOVY) 0.5 MG/0.5ML SOAJ Inject 0.5 mg into the skin every 7 (seven) days. 04/04/23   Tobb, Kardie, DO  Semaglutide-Weight Management (WEGOVY) 1 MG/0.5ML SOAJ Inject 1 mg into the skin every 7 (seven) days. 04/04/23   Tobb, Kardie, DO  spironolactone (ALDACTONE) 25 MG tablet TAKE 1 TABLET(25 MG) BY MOUTH DAILY 03/01/23   Charlsie Quest, NP  valsartan (DIOVAN) 160 MG tablet Take 1 tablet (160 mg total) by mouth 2 (two) times daily. 07/01/22   Alver Sorrow, NP  LISINOPRIL-HYDROCHLOROTHIAZIDE PO Take by mouth.  07/06/19  [provider]      Allergies    Hydrochlorothiazide, Chlorthalidone, and Lisinopril    Review of Systems   Review of Systems  Constitutional:  Negative for fever.  Gastrointestinal:  Positive for abdominal pain. Negative for diarrhea, nausea and vomiting.  Genitourinary:  Negative for dysuria and hematuria.  All other systems reviewed and are negative.   Physical Exam Updated Vital Signs BP Marland Kitchen)  152/68   Pulse 72   Temp 98.3 F (36.8 C) (Oral)   Resp 18   Ht 1.854 m (6\' 1" )   Wt (!) 167.8 kg   SpO2 100%   BMI 48.82 kg/m  Physical Exam Vitals and nursing note reviewed.  Constitutional:      Appearance: He is well-developed. He is obese. He is not ill-appearing.  HENT:     Head: Normocephalic and atraumatic.  Eyes:     Pupils: Pupils are equal, round, and reactive to light.  Cardiovascular:     Rate and Rhythm: Normal rate and regular rhythm.     Heart sounds: Normal heart sounds. No murmur heard. Pulmonary:     Effort: Pulmonary effort is normal. No respiratory distress.      Breath sounds: Normal breath sounds. No wheezing.  Abdominal:     General: Bowel sounds are normal.     Palpations: Abdomen is soft.     Tenderness: There is abdominal tenderness in the left lower quadrant. There is no guarding or rebound.  Musculoskeletal:     Cervical back: Neck supple.  Lymphadenopathy:     Cervical: No cervical adenopathy.  Skin:    General: Skin is warm and dry.  Neurological:     Mental Status: He is alert and oriented to person, place, and time.  Psychiatric:        Mood and Affect: Mood normal.     ED Results / Procedures / Treatments   Labs (all labs ordered are listed, but only abnormal results are displayed) Labs Reviewed  COMPREHENSIVE METABOLIC PANEL - Abnormal; Notable for the following components:      Result Value   Glucose, Bld 105 (*)    Creatinine, Ser 1.74 (*)    AST 14 (*)    Total Bilirubin 0.2 (*)    GFR, Estimated 49 (*)    All other components within normal limits  URINALYSIS, ROUTINE W REFLEX MICROSCOPIC - Abnormal; Notable for the following components:   Color, Urine COLORLESS (*)    All other components within normal limits  LIPASE, BLOOD  CBC    EKG None  Radiology No results found.  Procedures Procedures  {Document cardiac monitor, telemetry assessment procedure when appropriate:1}  Medications Ordered in ED Medications  morphine (PF) 4 MG/ML injection 4 mg (4 mg Intravenous Given 05/20/23 0607)  ondansetron (ZOFRAN) injection 4 mg (4 mg Intravenous Given 05/20/23 0607)    ED Course/ Medical Decision Making/ A&P   {   Click here for ABCD2, HEART and other calculatorsREFRESH Note before signing :1}                              Medical Decision Making Amount and/or Complexity of Data Reviewed Labs: ordered. Radiology: ordered.  Risk Prescription drug management.   ***  {Document critical care time when appropriate:1} {Document review of labs and clinical decision tools ie heart score, Chads2Vasc2 etc:1}   {Document your independent review of radiology images, and any outside records:1} {Document your discussion with family members, caretakers, and with consultants:1} {Document social determinants of health affecting pt's care:1} {Document your decision making why or why not admission, treatments were needed:1} Final Clinical Impression(s) / ED Diagnoses Final diagnoses:  None    Rx / DC Orders ED Discharge Orders     None

## 2023-05-20 NOTE — Discharge Instructions (Addendum)
You were seen today for abdominal pain.  Your CT shows persistent mass in your kidney.  One of them has gotten larger in size.  You need to follow-up with your doctor for further imaging.  There is a questionable kidney stone; however it is on the right side and does not likely correlate with your symptoms.  Take Tylenol as needed for ongoing pain.

## 2023-05-23 ENCOUNTER — Ambulatory Visit
Admission: RE | Admit: 2023-05-23 | Discharge: 2023-05-23 | Disposition: A | Discharge status: Home / Self Care | Payer: 59 | Source: Ambulatory Visit | Attending: Diagnostic Radiology | Admitting: Diagnostic Radiology

## 2023-05-23 DIAGNOSIS — C641 Malignant neoplasm of right kidney, except renal pelvis: Secondary | ICD-10-CM

## 2023-05-23 NOTE — Progress Notes (Signed)
Chief Complaint: Patient was consulted remotely today (TeleHealth) for follow-up of right renal cell carcinoma cryoablation.  Referring Physician(s): Winter, C  History of Present Illness: Calvin Bailey is a 42 y.o. 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  He underwent cryoablation of the right renal cell carcinoma on 02/16/2023.  Following procedure, he had back pain and had a follow-up CT that demonstrated a small subcapsular hematoma associated with the treated lesion.  Patient also has an indeterminate lesion in the right kidney upper pole which likely represents a complex or hemorrhagic cyst.  This indeterminate right renal lesion has minimally changed in size over multiple studies.  Patient was recently in the emergency department on 05/20/2023 for back pain and had a follow-up CT of the abdomen and pelvis without contrast.  Patient was told at the ED visit that his right renal lesion had enlarged and he is very concerned.  Patient says that the back pain has resolved with Tylenol.  He denies dysuria or hematuria.  There was a questionable stone at the right ureterovesical junction. Patient believes that his pain may be related to the [facility].  He says that this medication is giving him abdominal pain but he has lost approximately 25 lbs since re-starting the medication.  Past Medical History:  Diagnosis Date   Arthritis    Cancer (HCC)    right kidney   CHF (congestive heart failure) (HCC)    pt. denies   Chronic kidney disease    Dizziness 09/25/2019   GERD (gastroesophageal reflux disease)    HTN (hypertension)    Obesity hypoventilation syndrome (HCC) 04/08/2014   OSA (obstructive sleep apnea) 04/08/2014   PFO (patent foramen ovale)    Pneumonia    Sleep apnea    wears Cpap    Past Surgical History:  Procedure Laterality Date   RADIOLOGY WITH ANESTHESIA Right 02/16/2023   Procedure: RIGHT RENAL MASS CRYO ABLATION;  Surgeon: Richarda Overlie, MD;  Location: [facility];  Service: Anesthesiology;  Laterality: Right;   two knee surgeries Left 1995    Allergies: Hydrochlorothiazide, Chlorthalidone, and Lisinopril  Medications: Prior to Admission medications   Medication Sig Start Date End Date Taking? Authorizing Provider  acetaminophen (TYLENOL) 500 MG tablet Take 1 tablet (500 mg total) by mouth every 6 (six) hours as needed. 05/20/23   Horton, Mayer Masker, MD  allopurinol (ZYLOPRIM) 100 MG tablet TAKE 1 TABLET(100 MG) BY MOUTH DAILY 03/10/23   [place], Buren Kos, MD  amLODipine (NORVASC) 10 MG tablet TAKE 1 TABLET(10 MG) BY MOUTH DAILY 12/23/22   Tobb, Kardie, DO  colchicine 0.6 MG tablet Take 1 tablet (0.6 mg total) by mouth 2 (two) times daily. 03/10/23   [place], Buren Kos, MD  gabapentin (NEURONTIN) 100 MG capsule Take 1 capsule (100 mg total) by mouth 3 (three) times daily for 14 days. Patient not taking: Reported on 02/09/2023 01/15/23 01/29/23  Long, Arlyss Repress, MD  hydrALAZINE (APRESOLINE) 100 MG tablet Take 1 tablet (100 mg total) by mouth 3 (three) times daily. Patient taking differently: Take 100 mg by mouth 2 (two) times daily. 08/24/22   Chilton Si, MD  isosorbide mononitrate (IMDUR) 30 MG 24 hr tablet Take 1 tablet (30 mg total) by mouth daily. 11/22/22 02/20/23  Pokhrel, Rebekah Chesterfield, MD  labetalol (NORMODYNE) 300 MG tablet Take 1 tablet (300 mg total) by mouth 3 (three) times daily. Patient taking differently: Take 300 mg by mouth 2 (two) times daily. 11/21/22  Pokhrel, Laxman, MD  OVER THE COUNTER MEDICATION Take 15 mLs by mouth every other day. [place]    [provider]  Semaglutide-Weight Management (WEGOVY) 0.25 MG/0.5ML SOAJ Inject 0.25 mg into the skin every 7 (seven) days. 04/04/23   Tobb, Kardie, DO  Semaglutide-Weight Management (WEGOVY) 0.5 MG/0.5ML SOAJ Inject 0.5 mg into the skin every 7 (seven) days. 04/04/23   Tobb, Kardie, DO  Semaglutide-Weight Management (WEGOVY) 1 MG/0.5ML SOAJ Inject 1 mg into the  skin every 7 (seven) days. 04/04/23   Tobb, Kardie, DO  spironolactone (ALDACTONE) 25 MG tablet TAKE 1 TABLET(25 MG) BY MOUTH DAILY 03/01/23   Charlsie Quest, NP  valsartan (DIOVAN) 160 MG tablet Take 1 tablet (160 mg total) by mouth 2 (two) times daily. 07/01/22   Alver Sorrow, NP  LISINOPRIL-HYDROCHLOROTHIAZIDE PO Take by mouth.  07/06/19  [provider]     Family History  Problem Relation Age of Onset   Hypertension Mother    Multiple sclerosis Mother    Hypertension Father    Hypertension Brother    Hyperlipidemia Paternal Grandfather    Heart failure Paternal Grandfather    Diabetes Maternal Grandmother    Heart failure Maternal Grandfather    CVA Maternal Uncle     Social History   Socioeconomic History   Marital status: Single    Spouse name: Not on file   Number of children: 2   Years of education: college   Highest education level: Some college, no degree  Occupational History   Occupation: unemployment    Associate Professor: UNEMPLOYED    Employer: [employer]  Tobacco Use   Smoking status: Former    Current packs/day: 0.00    Average packs/day: 0.1 packs/day for 16.0 years (1.6 ttl pk-yrs)    Types: Cigarettes    Start date: 06/08/2006    Quit date: 06/08/2022    Years since quitting: 0.9   Smokeless tobacco: Never  Vaping Use   Vaping status: Never Used  Substance and Sexual Activity   Alcohol use: Not Currently    Comment: sparingly    Drug use: No   Sexual activity: Yes    Partners: Female  Other Topics Concern   Not on file  Social History Narrative   Corporate investment banker   Patient is single and lives alone.   Patient is left-handed.   Patient does not drink any caffeine.   Social Determinants of Health   Financial Resource Strain: Low Risk  (07/01/2022)   Overall Financial Resource Strain (CARDIA)    Difficulty of Paying Living Expenses: Not hard at all  Food Insecurity: No Food Insecurity (11/20/2022)   Hunger Vital Sign    Worried About  Running Out of Food in the Last Year: Never true    Ran Out of Food in the Last Year: Never true  Transportation Needs: No Transportation Needs (11/20/2022)   PRAPARE - Administrator, Civil Service (Medical): No    Lack of Transportation (Non-Medical): No  Physical Activity: Insufficiently Active (03/10/2023)   Exercise Vital Sign    Days of Exercise per Week: 4 days    Minutes of Exercise per Session: 20 min  Stress: No Stress Concern Present (07/01/2022)   [employer] of Occupational Health - Occupational Stress Questionnaire    Feeling of Stress : Only a little  Social Connections: Socially Isolated (07/01/2022)   Social Connection and Isolation Panel [NHANES]    Frequency of Communication with Friends and Family: Once a week  Frequency of Social Gatherings with Friends and Family: Once a week    Attends Religious Services: 1 to 4 times per year    Active Member of [employer] or Organizations: No    Attends Banker Meetings: Never    Marital Status: Never married    ECOG Status: 1 - Symptomatic but completely ambulatory  Review of Systems  Constitutional: Negative.   Gastrointestinal:  Positive for abdominal pain.  Genitourinary:  Negative for difficulty urinating, dysuria and hematuria.      Physical Exam No direct physical exam was performed   Vital Signs: There were no vitals taken for this visit.  Imaging: CT Renal Stone Study  Result Date: 05/20/2023 CLINICAL DATA:  Cryoablation of biopsy-proven papillary renal cell carcinoma 02/16/2023. Presents with left lower quadrant pain. EXAM: CT ABDOMEN AND PELVIS WITHOUT CONTRAST TECHNIQUE: Multidetector CT imaging of the abdomen and pelvis was performed following the standard protocol without IV contrast. RADIATION DOSE REDUCTION: This exam was performed according to the departmental dose-optimization program which includes automated exposure control, adjustment of the mA and/or kV according to  patient size and/or use of iterative reconstruction technique. COMPARISON:  CT without contrast 03/03/2023, CT with contrast 11/13/2022, MRI abdomen without and with contrast 08/28/2022. FINDINGS: Lower chest: Visualized lung bases are clear. The most superior dome of the right hemidiaphragm was not included in the scan. Hepatobiliary: 20 cm length and mildly steatotic. The extreme liver dome was excluded. The visualized liver otherwise unremarkable without contrast. The gallbladder and bile ducts are unremarkable. Pancreas: Unremarkable without contrast. Spleen: Unremarkable without contrast. Adrenals/Urinary Tract: There is no adrenal mass. The left renal cortex unremarkable without contrast. The treated hypodense mass of the outer mid right kidney today measures 2.7 x 1.6 cm. Of note, on the MRI this was smaller in size, measuring 2.4 x 1.2 cm. Consider PET-CT to assess for potential significance of this. There is a slightly hyperdense 3.3 x 2.8 cm mass in the superior pole, unchanged in size but on MRI was described as potentially a poorly enhancing neoplasm. There are no intrarenal stones. There is no hydroureteronephrosis. On series 2 axial 79 there is a possible 1 mm right UVJ stone. If this is an actual stone then it is nonobstructing. This could however be beam hardening artifact in the pelvis. Both ureters are otherwise unremarkable. There is mild bladder thickening versus underdistention. Stomach/Bowel: No dilatation or wall thickening including the appendix. Moderate fecal stasis. Diffuse diverticulosis without diverticulitis. Vascular/Lymphatic: No significant vascular findings are present. No enlarged abdominal or pelvic lymph nodes. Reproductive: Prostate is unremarkable. Other: Small umbilical fat hernia with umbilical rectus diastasis. No incarcerated hernia. No free air, free fluid or focal inflammatory process. Musculoskeletal: No acute or significant osseous findings. IMPRESSION: 1. Possible 1 mm  right UVJ stone versus beam hardening artifact. There is no hydroureteronephrosis. No intrarenal stones. 2. Mild bladder thickening versus underdistention. Correlate clinically for cystitis. 3. The treated hypodense mass of the outer mid right kidney today measures 2.7 x 1.6 cm. Of note, on the pretreatment MRI this was smaller in size, measuring 2.4 x 1.2 cm. Consider PET-CT to assess for potential significance of this. 4. 3.3 x 2.8 cm slightly hyperdense mass in the superior pole of the right kidney, unchanged in size but on MRI this was described as potentially a poorly enhancing neoplasm. 5. Constipation and diverticulosis. 6. Umbilical fat hernia with mild umbilical rectus diastasis. No incarcerated hernia. 7. Mildly prominent liver with mild steatosis. Electronically Signed  By: Almira Bar M.D.   On: 05/20/2023 06:59    Labs:  CBC: Recent Labs    01/09/23 1435 02/11/23 0842 03/10/23 1423 05/20/23 0540  WBC 10.8* 7.8 9.3 9.0  HGB 12.6* 12.9* 12.2* 13.0  HCT 37.8* 39.7 37.8 39.2  PLT 280 235 349 303    COAGS: Recent Labs    12/07/22 0656 02/11/23 0842  INR 1.1 1.1    BMP: Recent Labs    01/09/23 1435 02/11/23 0842 02/16/23 0815 03/10/23 1423 05/20/23 0540  NA 135 137 136 139 138  K 4.3 4.2 3.9 4.5 4.3  CL 104 107 106 105 107  CO2 22 23 23 21 22   GLUCOSE 95 101* 101* 92 105*  BUN 23* 17 24* 25* 20  CALCIUM 9.4 8.8* 8.6* 9.4 8.9  CREATININE 1.72* 1.48* 1.55* 1.59* 1.74*  GFRNONAA 50* 60* 57*  --  49*    LIVER FUNCTION TESTS: Recent Labs    11/20/22 0536 01/09/23 1435 03/10/23 1423 05/20/23 0540  BILITOT 0.5 0.5 <0.2 0.2*  AST 16 12* 10 14*  ALT 18 17 12 13   ALKPHOS 68 81 118 77  PROT 7.8 7.5 7.6 7.6  ALBUMIN 4.1 4.2 4.1 4.0    TUMOR MARKERS: No results for input(s): "AFPTM", "CEA", "CA199", "CHROMGRNA" in the last 8760 hours.  Assessment and Plan:  42 year old with biopsy-proven papillary cell renal carcinoma in the right kidney.  This lesion was  treated with cryoablation on 02/16/2023.  Patient has had two CT examinations of the abdomen and pelvis since the treatment.  The most recent CT demonstrated a stable hyperdense lesion in the right kidney upper pole that is minimally changed.  I suspect this is a hemorrhagic or complex cyst but need to continue surveillance.  The treated renal cell carcinoma was reported to have enlarged since the pretreatment lesion but this report is misleading.  The patient developed a subcapsular hematoma in this area following the ablation and this area is actually significantly smaller compared to the posttreatment CT on 03/03/2023.  The right renal lesion is measured at 2.7 x 1.7 cm on 05/20/2023 but this area measured 4.1 x 2.4 cm on the posttreatment CT on 03/03/2023.  This most recent noncontrast CT is not adequate to evaluate for tumor recurrence.  CT confirms that the subcapsular hematoma at the ablation site is resolving.  I explained these imaging findings to the patient and reassured him that there is no clear evidence of disease progression based on this recent noncontrast CT.  Patient continues to have the indeterminate hyperdense lesion along the right kidney pole that measures over 3 cm but this lesion has minimally changed over multiple studies and we will continue watching this area closely.  Due to patient's body habitus, previous MRI study was limited and we will plan to follow up with a CT of the abdomen (with and without contrast using renal tumor protocol) in November 2024.  Patient wants to stop [facility] but recommend he discuss with his ordering physician before he stops taking the medication.     Electronically Signed: Arn Medal 05/23/2023, 9:59 AM   I spent a total of    15 Minutes in remote  clinical consultation, greater than 50% of which was counseling/coordinating care for treated right renal cell carcinoma.    Visit type: Audio only (telephone). Audio (no video) only due to patient  preference. Alternative for in-person consultation at [facility], Patient ID: Calvin Bailey, male   DOB: March 01, 1981,  42 y.o.   MRN: [id number]

## 2023-05-25 ENCOUNTER — Other Ambulatory Visit: Payer: Self-pay | Admitting: Cardiology

## 2023-05-25 ENCOUNTER — Other Ambulatory Visit: Payer: Self-pay | Admitting: Cardiovascular Disease

## 2023-05-25 NOTE — Telephone Encounter (Signed)
Patient of Dr. Tobb. Please review for refill. Thank you!  

## 2023-06-10 ENCOUNTER — Ambulatory Visit: Payer: 59 | Attending: Cardiology | Admitting: Cardiology

## 2023-06-10 ENCOUNTER — Other Ambulatory Visit: Payer: Self-pay | Admitting: Cardiology

## 2023-06-10 ENCOUNTER — Encounter: Payer: Self-pay | Admitting: Cardiology

## 2023-06-10 VITALS — BP 142/94 | HR 67 | Ht 73.0 in | Wt 395.4 lb

## 2023-06-10 DIAGNOSIS — G4733 Obstructive sleep apnea (adult) (pediatric): Secondary | ICD-10-CM

## 2023-06-10 DIAGNOSIS — N1831 Chronic kidney disease, stage 3a: Secondary | ICD-10-CM | POA: Diagnosis not present

## 2023-06-10 DIAGNOSIS — I10 Essential (primary) hypertension: Secondary | ICD-10-CM

## 2023-06-10 MED ORDER — ISOSORBIDE MONONITRATE ER 30 MG PO TB24: 30.0000 mg | ORAL_TABLET | Freq: Every day | ORAL | 0 refills | Status: DC

## 2023-06-10 MED ORDER — ISOSORBIDE MONONITRATE ER 30 MG PO TB24: 30.0000 mg | ORAL_TABLET | Freq: Every day | ORAL | 1 refills | Status: DC

## 2023-06-10 NOTE — Patient Instructions (Signed)
Medication Instructions:  Your physician has recommended you make the following change in your medication:  START: Imdur 30 mg once daily  *If you need a refill on your cardiac medications before your next appointment, please call your pharmacy*   Lab Work: None   Testing/Procedures: None   Follow-Up: At [facility], you and your health needs are our priority.  As part of our continuing mission to provide you with exceptional heart care, we have created designated Provider Care Teams.  These Care Teams include your primary Cardiologist (physician) and Advanced Practice Providers (APPs -  Physician Assistants and Nurse Practitioners) who all work together to provide you with the care you need, when you need it.    Your next appointment:   6 month(s)  Provider:   Thomasene Ripple, DO     Other Instructions Please follow up with pharmacy staff about medication.

## 2023-06-10 NOTE — Progress Notes (Signed)
Cardiology Office Note:    Date:  06/10/2023   ID:  TREQUAN WALMSLEY, DOB June 21, 1981, MRN [id number]  PCP:  de [place], [name], MD  Cardiologist:  Thomasene Ripple, DO  Electrophysiologist:  None   Referring MD: de [place], [name], MD   " I am doing ok"  History of Present Illness:    Calvin Bailey is a 42 y.o. male with a hx of chronic hypertension has been on multiple antihypertensive, tobacco use, chronic kidney disease, obstructive sleep apnea, dyslipidemia and obesity here today for follow-up visit.  Since I last saw the patient he has been seen in the hospital for multiple reasons.  During one of his hospital visit he was experiencing chest pain Imdur was started.  He tells me that his Imdur has helped him significantly.  His blood pressure has been under control with this.  But unfortunately he ran out and did not have any refills.  Past Medical History:  Diagnosis Date   Arthritis    Cancer (HCC)    right kidney   CHF (congestive heart failure) (HCC)    pt. denies   Chronic kidney disease    Dizziness 09/25/2019   GERD (gastroesophageal reflux disease)    HTN (hypertension)    Obesity hypoventilation syndrome (HCC) 04/08/2014   OSA (obstructive sleep apnea) 04/08/2014   PFO (patent foramen ovale)    Pneumonia    Sleep apnea    wears Cpap    Past Surgical History:  Procedure Laterality Date   RADIOLOGY WITH ANESTHESIA Right 02/16/2023   Procedure: RIGHT RENAL MASS CRYO ABLATION;  Surgeon: Richarda Overlie, MD;  Location: [facility];  Service: Anesthesiology;  Laterality: Right;   two knee surgeries Left 1995    Current Medications: Current Meds  Medication Sig   acetaminophen (TYLENOL) 500 MG tablet Take 1 tablet (500 mg total) by mouth every 6 (six) hours as needed.   allopurinol (ZYLOPRIM) 100 MG tablet TAKE 1 TABLET(100 MG) BY MOUTH DAILY   amLODipine (NORVASC) 10 MG tablet TAKE 1 TABLET(10 MG) BY MOUTH DAILY   colchicine 0.6 MG tablet Take 1 tablet (0.6 mg total) by  mouth 2 (two) times daily.   hydrALAZINE (APRESOLINE) 100 MG tablet TAKE 1 TABLET(100 MG) BY MOUTH THREE TIMES DAILY   isosorbide mononitrate (IMDUR) 30 MG 24 hr tablet Take 1 tablet (30 mg total) by mouth daily.   labetalol (NORMODYNE) 300 MG tablet Take 1 tablet (300 mg total) by mouth 3 (three) times daily. (Patient taking differently: Take 300 mg by mouth 2 (two) times daily.)   OVER THE COUNTER MEDICATION Take 15 mLs by mouth every other day. Soursop   spironolactone (ALDACTONE) 25 MG tablet TAKE 1 TABLET(25 MG) BY MOUTH DAILY   valsartan (DIOVAN) 160 MG tablet TAKE 1 TABLET(160 MG) BY MOUTH TWICE DAILY     Allergies:   Hydrochlorothiazide, Chlorthalidone, and Lisinopril   Social History   Socioeconomic History   Marital status: Single    Spouse name: Not on file   Number of children: 2   Years of education: college   Highest education level: Some college, no degree  Occupational History   Occupation: unemployment    Associate Professor: UNEMPLOYED    Employer: [employer]  Tobacco Use   Smoking status: Former    Current packs/day: 0.00    Average packs/day: 0.1 packs/day for 16.0 years (1.6 ttl pk-yrs)    Types: Cigarettes    Start date: 06/08/2006    Quit date: 06/08/2022  Years since quitting: 1.0   Smokeless tobacco: Never  Vaping Use   Vaping status: Never Used  Substance and Sexual Activity   Alcohol use: Not Currently    Comment: sparingly    Drug use: No   Sexual activity: Yes    Partners: Female  Other Topics Concern   Not on file  Social History Narrative   Corporate investment banker   Patient is single and lives alone.   Patient is left-handed.   Patient does not drink any caffeine.   Social Determinants of Health   Financial Resource Strain: Low Risk  (07/01/2022)   Overall Financial Resource Strain (CARDIA)    Difficulty of Paying Living Expenses: Not hard at all  Food Insecurity: No Food Insecurity (11/20/2022)   Hunger Vital Sign    Worried About Running Out  of Food in the Last Year: Never true    Ran Out of Food in the Last Year: Never true  Transportation Needs: No Transportation Needs (11/20/2022)   PRAPARE - Administrator, Civil Service (Medical): No    Lack of Transportation (Non-Medical): No  Physical Activity: Insufficiently Active (03/10/2023)   Exercise Vital Sign    Days of Exercise per Week: 4 days    Minutes of Exercise per Session: 20 min  Stress: No Stress Concern Present (07/01/2022)   [employer] of Occupational Health - Occupational Stress Questionnaire    Feeling of Stress : Only a little  Social Connections: Socially Isolated (07/01/2022)   Social Connection and Isolation Panel [NHANES]    Frequency of Communication with Friends and Family: Once a week    Frequency of Social Gatherings with Friends and Family: Once a week    Attends Religious Services: 1 to 4 times per year    Active Member of [employer] or Organizations: No    Attends Engineer, structural: Never    Marital Status: Never married     Family History: The patient's family history includes CVA in his maternal uncle; Diabetes in his maternal grandmother; Heart failure in his maternal grandfather and paternal grandfather; Hyperlipidemia in his paternal grandfather; Hypertension in his brother, father, and mother; Multiple sclerosis in his mother.  ROS:   Review of Systems  Constitution: Negative for decreased appetite, fever and weight gain.  HENT: Negative for congestion, ear discharge, hoarse voice and sore throat.   Eyes: Negative for discharge, redness, vision loss in right eye and visual halos.  Cardiovascular: Negative for chest pain, dyspnea on exertion, leg swelling, orthopnea and palpitations.  Respiratory: Negative for cough, hemoptysis, shortness of breath and snoring.   Endocrine: Negative for heat intolerance and polyphagia.  Hematologic/Lymphatic: Negative for bleeding problem. Does not bruise/bleed easily.  Skin: Negative  for flushing, nail changes, rash and suspicious lesions.  Musculoskeletal: Negative for arthritis, joint pain, muscle cramps, myalgias, neck pain and stiffness.  Gastrointestinal: Negative for abdominal pain, bowel incontinence, diarrhea and excessive appetite.  Genitourinary: Negative for decreased libido, genital sores and incomplete emptying.  Neurological: Negative for brief paralysis, focal weakness, headaches and loss of balance.  Psychiatric/Behavioral: Negative for altered mental status, depression and suicidal ideas.  Allergic/Immunologic: Negative for HIV exposure and persistent infections.    EKGs/Labs/Other Studies Reviewed:    The following studies were reviewed today:   EKG:  The ekg ordered today demonstrates   Recent Labs: 11/20/2022: B Natriuretic Peptide 46.5 11/23/2022: Magnesium 2.7 03/10/2023: TSH 0.296 05/20/2023: ALT 13; BUN 20; Creatinine, Ser 1.74; Hemoglobin 13.0; Platelets 303; Potassium 4.3; Sodium  138  Recent Lipid Panel    Component Value Date/Time   CHOL 136 02/27/2022 0756   CHOL 149 05/30/2019 1500   TRIG 74 02/27/2022 0756   HDL 28 (L) 02/27/2022 0756   HDL 34 (L) 05/30/2019 1500   CHOLHDL 4.9 02/27/2022 0756   VLDL 15 02/27/2022 0756   LDLCALC 93 02/27/2022 0756   LDLCALC 93 05/30/2019 1500    Physical Exam:    VS:  BP (!) 142/94 (BP Location: Left Arm, Patient Position: Sitting, Cuff Size: Large)   Pulse 67   Ht 6\' 1"  (1.854 m)   Wt (!) 395 lb 6.4 oz (179.4 kg)   SpO2 97%   BMI 52.17 kg/m     Wt Readings from Last 3 Encounters:  06/10/23 (!) 395 lb 6.4 oz (179.4 kg)  05/20/23 (!) 370 lb (167.8 kg)  03/10/23 (!) 372 lb 11.2 oz (169.1 kg)     GEN: Well nourished, well developed in no acute distress HEENT: Normal NECK: No JVD; No carotid bruits LYMPHATICS: No lymphadenopathy CARDIAC: S1S2 noted,RRR, no murmurs, rubs, gallops RESPIRATORY:  Clear to auscultation without rales, wheezing or rhonchi  ABDOMEN: Soft, non-tender,  non-distended, +bowel sounds, no guarding. EXTREMITIES: No edema, No cyanosis, no clubbing MUSCULOSKELETAL:  No deformity  SKIN: Warm and dry NEUROLOGIC:  Alert and oriented x 3, non-focal PSYCHIATRIC:  Normal affect, good insight  ASSESSMENT:    1. Essential hypertension   2. OSA (obstructive sleep apnea)   3. Chronic kidney disease, stage 3a (HCC)   4. Morbid obesity due to excess calories (HCC)     PLAN:     His blood pressure is elevated in the office today.  Will restart Imdur 30 mg daily.  Continue on the hydralazine 100 mg 3 times daily, the labetalol 300 mg 3 times daily, amlodipine 100 mg daily, valsartan 160 mg twice daily and spironolactone 25 mg daily.  I congratulated the patient today also because he tells me that he quit smoking 2 months ago.  He also should follow-up with our pharmacist colleagues as well.  He tells me he has been experiencing constipation on Wegovy.  Avoid nephrotoxin  The patient is in agreement with the above plan. The patient left the office in stable condition.  The patient will follow up in   Medication Adjustments/Labs and Tests Ordered: Current medicines are reviewed at length with the patient today.  Concerns regarding medicines are outlined above.  No orders of the defined types were placed in this encounter.  Meds ordered this encounter  Medications   isosorbide mononitrate (IMDUR) 30 MG 24 hr tablet    Sig: Take 1 tablet (30 mg total) by mouth daily.    Dispense:  90 tablet    Refill:  0   isosorbide mononitrate (IMDUR) 30 MG 24 hr tablet    Sig: Take 1 tablet (30 mg total) by mouth daily.    Dispense:  30 tablet    Refill:  1    Patient Instructions  Medication Instructions:  Your physician has recommended you make the following change in your medication:  START: Imdur 30 mg once daily  *If you need a refill on your cardiac medications before your next appointment, please call your pharmacy*   Lab  Work: None   Testing/Procedures: None   Follow-Up: At [facility], you and your health needs are our priority.  As part of our continuing mission to provide you with exceptional heart care, we have created designated Provider Care  Teams.  These Care Teams include your primary Cardiologist (physician) and Advanced Practice Providers (APPs -  Physician Assistants and Nurse Practitioners) who all work together to provide you with the care you need, when you need it.    Your next appointment:   6 month(s)  Provider:   Thomasene Ripple, DO     Other Instructions Please follow up with pharmacy staff about medication.    Adopting a Healthy Lifestyle.  Know what a healthy weight is for you (roughly BMI <25) and aim to maintain this   Aim for 7+ servings of fruits and vegetables daily   65-80+ fluid ounces of water or unsweet tea for healthy kidneys   Limit to max 1 drink of alcohol per day; avoid smoking/tobacco   Limit animal fats in diet for cholesterol and heart health - choose grass fed whenever available   Avoid highly processed foods, and foods high in saturated/trans fats   Aim for low stress - take time to unwind and care for your mental health   Aim for 150 min of moderate intensity exercise weekly for heart health, and weights twice weekly for bone health   Aim for 7-9 hours of sleep daily   When it comes to diets, agreement about the perfect plan isnt easy to find, even among the experts. Experts at the [facility] [employer] developed an idea known as the Healthy Eating Plate. Just imagine a plate divided into logical, healthy portions.   The emphasis is on diet quality:   Load up on vegetables and fruits - one-half of your plate: Aim for color and variety, and remember that potatoes dont count.   Go for whole grains - one-quarter of your plate: Whole wheat, barley, wheat berries, quinoa, oats, brown rice, and foods made with them. If you want  pasta, go with whole wheat pasta.   Protein power - one-quarter of your plate: Fish, chicken, beans, and nuts are all healthy, versatile protein sources. Limit red meat.   The diet, however, does go beyond the plate, offering a few other suggestions.   Use healthy plant oils, such as olive, canola, soy, corn, sunflower and peanut. Check the labels, and avoid partially hydrogenated oil, which have unhealthy trans fats.   If youre thirsty, drink water. Coffee and tea are good in moderation, but skip sugary drinks and limit milk and dairy products to one or two daily servings.   The type of carbohydrate in the diet is more important than the amount. Some sources of carbohydrates, such as vegetables, fruits, whole grains, and beans-are healthier than others.   Finally, stay active  Signed, Thomasene Ripple, DO  06/10/2023 9:33 AM    [facility]

## 2023-06-15 ENCOUNTER — Ambulatory Visit: Payer: 59 | Attending: Cardiovascular Disease

## 2023-06-15 NOTE — Progress Notes (Deleted)
Office Visit    Patient Name: Calvin Bailey Date of Encounter: 06/15/2023  Primary Care Provider:  de [place], Buren Kos, MD Primary Cardiologist:  Thomasene Ripple, DO  Chief Complaint    Hypertension  Significant Past Medical History   CHF Chronic diastolic  OSA On CPAP  CKD 8/24 SCr 1.74, eGFR 49  Morbid obesity On Wegovy  migraine     Allergies  Allergen Reactions   Hydrochlorothiazide Other (See Comments)   Chlorthalidone Nausea Only   Lisinopril Cough    Coughing up blood    History of Present Illness    Calvin Bailey is a 42 y.o. male patient of Dr Servando Salina, in the office today for hypertension management.  She most recently saw him last month, at which time his BP was 142/94.  He noted that he had been given isosorbide mono at a previous ER visit and felt best when on it.  Had since run out of med, and had no refills available.  She re-started his isosorbide.  Patient was enrolled in the Advanced hypertension RPM research last October  Blood Pressure Goal:  130/80  Current Medications: amlodipine 10 mg every day, hydralazine 100 mg tid, labetalol 300 mg tid, spironolactone 25 mg every day, valsartan 160 mg bid   Previously tried:   ACEI - cough; thiazides - nausea  Family Hx:     Social Hx:      Tobacco:  Alcohol:  Caffeine: Diet:      Exercise:   Home BP readings:      Adherence Assessment  Do you ever forget to take your medication? [] Yes [] No  Do you ever skip doses due to side effects? [] Yes [] No  Do you have trouble affording your medicines? [] Yes [] No  Are you ever unable to pick up your medication due to transportation difficulties? [] Yes [] No  Do you ever stop taking your medications because you don't believe they are helping? [] Yes [] No  Do you check your weight daily? [] Yes [] No   Adherence strategy: ***  Barriers to obtaining medications: ***     Accessory Clinical Findings    Lab Results  Component Value Date   CREATININE  1.74 (H) 05/20/2023   BUN 20 05/20/2023   NA 138 05/20/2023   K 4.3 05/20/2023   CL 107 05/20/2023   CO2 22 05/20/2023   Lab Results  Component Value Date   ALT 13 05/20/2023   AST 14 (L) 05/20/2023   ALKPHOS 77 05/20/2023   BILITOT 0.2 (L) 05/20/2023   Lab Results  Component Value Date   HGBA1C 5.5 03/10/2023    Home Medications    Current Outpatient Medications  Medication Sig Dispense Refill   acetaminophen (TYLENOL) 500 MG tablet Take 1 tablet (500 mg total) by mouth every 6 (six) hours as needed. 30 tablet 0   allopurinol (ZYLOPRIM) 100 MG tablet TAKE 1 TABLET(100 MG) BY MOUTH DAILY 90 tablet 0   amLODipine (NORVASC) 10 MG tablet TAKE 1 TABLET(10 MG) BY MOUTH DAILY 90 tablet 3   colchicine 0.6 MG tablet Take 1 tablet (0.6 mg total) by mouth 2 (two) times daily. 60 tablet 0   gabapentin (NEURONTIN) 100 MG capsule Take 1 capsule (100 mg total) by mouth 3 (three) times daily for 14 days. (Patient not taking: Reported on 02/09/2023) 42 capsule 0   hydrALAZINE (APRESOLINE) 100 MG tablet TAKE 1 TABLET(100 MG) BY MOUTH THREE TIMES DAILY 270 tablet 3   isosorbide mononitrate (IMDUR) 30 MG 24  hr tablet Take 1 tablet (30 mg total) by mouth daily. 90 tablet 0   isosorbide mononitrate (IMDUR) 30 MG 24 hr tablet TAKE 1 TABLET(30 MG) BY MOUTH DAILY 90 tablet 0   labetalol (NORMODYNE) 300 MG tablet Take 1 tablet (300 mg total) by mouth 3 (three) times daily. (Patient taking differently: Take 300 mg by mouth 2 (two) times daily.) 180 tablet 3   OVER THE COUNTER MEDICATION Take 15 mLs by mouth every other day. Soursop     Semaglutide-Weight Management (WEGOVY) 0.25 MG/0.5ML SOAJ Inject 0.25 mg into the skin every 7 (seven) days. (Patient not taking: Reported on 06/10/2023) 2 mL 0   Semaglutide-Weight Management (WEGOVY) 0.5 MG/0.5ML SOAJ Inject 0.5 mg into the skin every 7 (seven) days. (Patient not taking: Reported on 06/10/2023) 2 mL 0   Semaglutide-Weight Management (WEGOVY) 1 MG/0.5ML SOAJ  Inject 1 mg into the skin every 7 (seven) days. (Patient not taking: Reported on 06/10/2023) 2 mL 0   spironolactone (ALDACTONE) 25 MG tablet TAKE 1 TABLET(25 MG) BY MOUTH DAILY 90 tablet 1   valsartan (DIOVAN) 160 MG tablet TAKE 1 TABLET(160 MG) BY MOUTH TWICE DAILY 180 tablet 3   No current facility-administered medications for this visit.     No BP recorded.  {Refresh Note OR Click here to enter BP  :1}***   Assessment & Plan    No problem-specific Assessment & Plan notes found for this encounter.   Phillips Hay PharmD CPP [facility]  [address] [place], [place] [address] [phone]

## 2023-06-24 ENCOUNTER — Other Ambulatory Visit (HOSPITAL_BASED_OUTPATIENT_CLINIC_OR_DEPARTMENT_OTHER): Payer: Self-pay | Admitting: Family

## 2023-07-01 ENCOUNTER — Other Ambulatory Visit (HOSPITAL_BASED_OUTPATIENT_CLINIC_OR_DEPARTMENT_OTHER): Payer: Self-pay

## 2023-07-11 ENCOUNTER — Other Ambulatory Visit (HOSPITAL_BASED_OUTPATIENT_CLINIC_OR_DEPARTMENT_OTHER): Payer: Self-pay

## 2023-07-26 ENCOUNTER — Other Ambulatory Visit (HOSPITAL_BASED_OUTPATIENT_CLINIC_OR_DEPARTMENT_OTHER): Payer: Self-pay | Admitting: Family

## 2023-08-15 ENCOUNTER — Other Ambulatory Visit: Payer: Self-pay | Admitting: Interventional Radiology

## 2023-08-15 DIAGNOSIS — N2889 Other specified disorders of kidney and ureter: Secondary | ICD-10-CM

## 2023-08-17 ENCOUNTER — Other Ambulatory Visit: Payer: Self-pay | Admitting: Cardiology

## 2023-08-17 ENCOUNTER — Other Ambulatory Visit (HOSPITAL_BASED_OUTPATIENT_CLINIC_OR_DEPARTMENT_OTHER): Payer: Self-pay

## 2023-08-17 MED ORDER — WEGOVY 1 MG/0.5ML ~~LOC~~ SOAJ
1.0000 mg | SUBCUTANEOUS | 0 refills | Status: DC
  Filled 2023-08-17: qty 2, 28d supply, fill #0

## 2023-08-18 ENCOUNTER — Encounter (HOSPITAL_BASED_OUTPATIENT_CLINIC_OR_DEPARTMENT_OTHER): Payer: Self-pay | Admitting: Family Medicine

## 2023-08-18 ENCOUNTER — Other Ambulatory Visit (HOSPITAL_BASED_OUTPATIENT_CLINIC_OR_DEPARTMENT_OTHER): Payer: Self-pay

## 2023-08-18 ENCOUNTER — Encounter (HOSPITAL_BASED_OUTPATIENT_CLINIC_OR_DEPARTMENT_OTHER): Payer: Self-pay

## 2023-08-19 ENCOUNTER — Ambulatory Visit
Admission: RE | Admit: 2023-08-19 | Discharge: 2023-08-19 | Disposition: A | Discharge status: Home / Self Care | Payer: 59 | Source: Ambulatory Visit | Attending: Interventional Radiology

## 2023-08-19 DIAGNOSIS — N2889 Other specified disorders of kidney and ureter: Secondary | ICD-10-CM

## 2023-08-19 MED ORDER — IOPAMIDOL (ISOVUE-300) INJECTION 61%
500.0000 mL | Freq: Once | INTRAVENOUS | Status: AC | PRN
  Administered 2023-08-19: 80 mL via INTRAVENOUS

## 2023-09-08 ENCOUNTER — Other Ambulatory Visit: Payer: Self-pay | Admitting: Cardiology

## 2023-09-10 ENCOUNTER — Other Ambulatory Visit: Payer: Self-pay | Admitting: Cardiovascular Disease

## 2023-10-10 ENCOUNTER — Encounter (HOSPITAL_BASED_OUTPATIENT_CLINIC_OR_DEPARTMENT_OTHER): Payer: Self-pay | Admitting: Emergency Medicine

## 2023-10-10 ENCOUNTER — Emergency Department (HOSPITAL_BASED_OUTPATIENT_CLINIC_OR_DEPARTMENT_OTHER)
Admission: EM | Admit: 2023-10-10 | Discharge: 2023-10-10 | Disposition: A | Discharge status: Home / Self Care | Payer: 59 | Attending: Emergency Medicine | Admitting: Emergency Medicine

## 2023-10-10 ENCOUNTER — Other Ambulatory Visit: Payer: Self-pay

## 2023-10-10 DIAGNOSIS — E875 Hyperkalemia: Secondary | ICD-10-CM | POA: Insufficient documentation

## 2023-10-10 DIAGNOSIS — Z20822 Contact with and (suspected) exposure to covid-19: Secondary | ICD-10-CM | POA: Insufficient documentation

## 2023-10-10 DIAGNOSIS — I10 Essential (primary) hypertension: Secondary | ICD-10-CM

## 2023-10-10 DIAGNOSIS — J069 Acute upper respiratory infection, unspecified: Secondary | ICD-10-CM | POA: Diagnosis not present

## 2023-10-10 DIAGNOSIS — R0602 Shortness of breath: Secondary | ICD-10-CM | POA: Diagnosis present

## 2023-10-10 DIAGNOSIS — Z79899 Other long term (current) drug therapy: Secondary | ICD-10-CM | POA: Diagnosis not present

## 2023-10-10 DIAGNOSIS — I129 Hypertensive chronic kidney disease with stage 1 through stage 4 chronic kidney disease, or unspecified chronic kidney disease: Secondary | ICD-10-CM | POA: Diagnosis not present

## 2023-10-10 DIAGNOSIS — R0609 Other forms of dyspnea: Secondary | ICD-10-CM

## 2023-10-10 DIAGNOSIS — N1832 Chronic kidney disease, stage 3b: Secondary | ICD-10-CM | POA: Insufficient documentation

## 2023-10-10 DIAGNOSIS — R058 Other specified cough: Secondary | ICD-10-CM

## 2023-10-10 LAB — RESP PANEL BY RT-PCR (RSV, FLU A&B, COVID)  RVPGX2
Influenza A by PCR: NEGATIVE
Influenza B by PCR: NEGATIVE
Resp Syncytial Virus by PCR: NEGATIVE
SARS Coronavirus 2 by RT PCR: NEGATIVE

## 2023-10-10 LAB — BASIC METABOLIC PANEL
Anion gap: 9 (ref 5–15)
BUN: 25 mg/dL — ABNORMAL HIGH (ref 6–20)
CO2: 21 mmol/L — ABNORMAL LOW (ref 22–32)
Calcium: 9.3 mg/dL (ref 8.9–10.3)
Chloride: 104 mmol/L (ref 98–111)
Creatinine, Ser: 1.82 mg/dL — ABNORMAL HIGH (ref 0.61–1.24)
GFR, Estimated: 46 mL/min — ABNORMAL LOW (ref >60)
Glucose, Bld: 111 mg/dL — ABNORMAL HIGH (ref 70–99)
Potassium: 5.9 mmol/L — ABNORMAL HIGH (ref 3.5–5.1)
Sodium: 134 mmol/L — ABNORMAL LOW (ref 135–145)

## 2023-10-10 LAB — CBC
HCT: 42.7 % (ref 39.0–52.0)
Hemoglobin: 14.2 g/dL (ref 13.0–17.0)
MCH: 28.3 pg (ref 26.0–34.0)
MCHC: 33.3 g/dL (ref 30.0–36.0)
MCV: 85.2 fL (ref 80.0–100.0)
Platelets: 237 10*3/uL (ref 150–400)
RBC: 5.01 MIL/uL (ref 4.22–5.81)
RDW: 13.1 % (ref 11.5–15.5)
WBC: 6 10*3/uL (ref 4.0–10.5)
nRBC: 0 % (ref 0.0–0.2)

## 2023-10-10 LAB — TROPONIN I (HIGH SENSITIVITY)
Troponin I (High Sensitivity): 7 ng/L (ref <18)
Troponin I (High Sensitivity): 6 ng/L (ref <18)

## 2023-10-10 LAB — BRAIN NATRIURETIC PEPTIDE: B Natriuretic Peptide: 11.8 pg/mL (ref 0.0–100.0)

## 2023-10-10 LAB — POTASSIUM: Potassium: 6.2 mmol/L — ABNORMAL HIGH (ref 3.5–5.1)

## 2023-10-10 MED ORDER — INSULIN ASPART 100 UNIT/ML IJ SOLN
6.0000 [IU] | Freq: Once | INTRAMUSCULAR | Status: AC
  Administered 2023-10-10: 6 [IU] via INTRAVENOUS

## 2023-10-10 MED ORDER — DEXTROSE 50 % IV SOLN
50.0000 mL | Freq: Once | INTRAVENOUS | Status: AC
  Administered 2023-10-10: 50 mL via INTRAVENOUS
  Filled 2023-10-10: qty 50

## 2023-10-10 MED ORDER — ALBUTEROL SULFATE (2.5 MG/3ML) 0.083% IN NEBU
5.0000 mg | INHALATION_SOLUTION | Freq: Once | RESPIRATORY_TRACT | Status: AC
  Administered 2023-10-10: 5 mg via RESPIRATORY_TRACT
  Filled 2023-10-10: qty 6

## 2023-10-10 MED ORDER — SODIUM BICARBONATE 8.4 % IV SOLN
50.0000 meq | Freq: Once | INTRAVENOUS | Status: AC
  Administered 2023-10-10: 50 meq via INTRAVENOUS
  Filled 2023-10-10: qty 50

## 2023-10-10 MED ORDER — IPRATROPIUM BROMIDE 0.02 % IN SOLN
0.5000 mg | Freq: Once | RESPIRATORY_TRACT | Status: AC
  Administered 2023-10-10: 0.5 mg via RESPIRATORY_TRACT
  Filled 2023-10-10: qty 2.5

## 2023-10-10 MED ORDER — SODIUM CHLORIDE 0.9 % IV BOLUS
1000.0000 mL | Freq: Once | INTRAVENOUS | Status: AC
  Administered 2023-10-10: 1000 mL via INTRAVENOUS

## 2023-10-10 MED ORDER — SODIUM ZIRCONIUM CYCLOSILICATE 10 G PO PACK
10.0000 g | PACK | Freq: Once | ORAL | Status: AC
  Administered 2023-10-10: 10 g via ORAL
  Filled 2023-10-10: qty 1

## 2023-10-10 MED ORDER — CALCIUM GLUCONATE 10 % IV SOLN
1.0000 g | Freq: Once | INTRAVENOUS | Status: AC
  Administered 2023-10-10: 1 g via INTRAVENOUS
  Filled 2023-10-10: qty 10

## 2023-10-10 NOTE — ED Provider Notes (Signed)
 [facility] [place] [facility] Provider Note   CSN: [id number] Arrival date & time: 10/10/23  1752     History  Chief Complaint  Patient presents with   Shortness of Breath    Calvin Bailey is a 43 y.o. male.  Pt with c/o non prod cough, sob/doe in past week and with cough. States felt as if had flu last week - felt he may have pneumonia so he went to urgent care today and was told no definite pna, but ?vascular congestion on cxr. Denies chest pain or discomfort. No pleuritic pain. Smoker. Denies hx regular mdi use. No fever/chills. No new leg pain or swelling. No recent trauma, travel, surgery, or immobility. No specific known ill contacts. No change in meds. Indicates was given steroid shot and albuterol  treatment which seem to have helped. Indicates has been on fruit diet, eating large amounts fruits.   The history is provided by the patient and medical records.  Shortness of Breath Associated symptoms: cough   Associated symptoms: no abdominal pain, no chest pain, no diaphoresis, no fever, no headaches, no neck pain, no rash, no sore throat and no vomiting        Home Medications Prior to Admission medications   Medication Sig Start Date End Date Taking? Authorizing Provider  acetaminophen  (TYLENOL ) 500 MG tablet Take 1 tablet (500 mg total) by mouth every 6 (six) hours as needed. 05/20/23   Horton, Charmaine FALCON, MD  allopurinol  (ZYLOPRIM ) 100 MG tablet TAKE 1 TABLET(100 MG) BY MOUTH DAILY 03/10/23   [place], Quintin PARAS, MD  amLODipine  (NORVASC ) 10 MG tablet TAKE 1 TABLET(10 MG) BY MOUTH DAILY 12/23/22   Tobb, Kardie, DO  colchicine  0.6 MG tablet Take 1 tablet (0.6 mg total) by mouth 2 (two) times daily. 03/10/23   [place], Quintin PARAS, MD  gabapentin  (NEURONTIN ) 100 MG capsule Take 1 capsule (100 mg total) by mouth 3 (three) times daily for 14 days. Patient not taking: Reported on 02/09/2023 01/15/23 01/29/23  Darra Fonda MATSU, MD  hydrALAZINE  (APRESOLINE ) 100  MG tablet TAKE 1 TABLET(100 MG) BY MOUTH THREE TIMES DAILY 05/26/23   Raford Riggs, MD  isosorbide  mononitrate (IMDUR ) 30 MG 24 hr tablet TAKE 1 TABLET(30 MG) BY MOUTH DAILY 06/10/23   Tobb, Kardie, DO  isosorbide  mononitrate (IMDUR ) 30 MG 24 hr tablet TAKE 1 TABLET BY MOUTH DAILY 09/08/23   Tobb, Kardie, DO  labetalol  (NORMODYNE ) 300 MG tablet TAKE 1 TABLET(300 MG) BY MOUTH TWICE DAILY 09/12/23   Tobb, Kardie, DO  OVER THE COUNTER MEDICATION Take 15 mLs by mouth every other day. [employer]    [provider]  Semaglutide -Weight Management (WEGOVY ) 0.25 MG/0.5ML SOAJ Inject 0.25 mg into the skin every 7 (seven) days. Patient not taking: Reported on 06/10/2023 04/04/23   Tobb, Kardie, DO  Semaglutide -Weight Management (WEGOVY ) 0.5 MG/0.5ML SOAJ Inject 0.5 mg into the skin every 7 (seven) days. Patient not taking: Reported on 06/10/2023 04/04/23   Tobb, Kardie, DO  Semaglutide -Weight Management (WEGOVY ) 1 MG/0.5ML SOAJ Inject 1 mg into the skin every 7 (seven) days. 08/17/23   Tobb, Kardie, DO  spironolactone  (ALDACTONE ) 25 MG tablet TAKE 1 TABLET(25 MG) BY MOUTH DAILY 06/24/23   Tobb, Kardie, DO  valsartan  (DIOVAN ) 160 MG tablet TAKE 1 TABLET(160 MG) BY MOUTH TWICE DAILY 07/26/23   Tobb, Kardie, DO  LISINOPRIL -HYDROCHLOROTHIAZIDE  PO Take by mouth.  07/06/19  [provider]      Allergies    Hydrochlorothiazide , Chlorthalidone ,  and Lisinopril     Review of Systems   Review of Systems  Constitutional:  Negative for chills, diaphoresis and fever.  HENT:  Positive for congestion and rhinorrhea. Negative for sore throat.   Eyes:  Negative for redness.  Respiratory:  Positive for cough and shortness of breath.   Cardiovascular:  Negative for chest pain, palpitations and leg swelling.  Gastrointestinal:  Negative for abdominal pain, nausea and vomiting.  Genitourinary:  Negative for dysuria and flank pain.  Musculoskeletal:  Negative for neck pain and neck stiffness.  Skin:  Negative  for rash.  Neurological:  Negative for headaches.  Hematological:  Does not bruise/bleed easily.  Psychiatric/Behavioral:  Negative for confusion.     Physical Exam Updated Vital Signs BP (!) 147/82   Pulse 67   Temp 98.2 F (36.8 C) (Oral)   Resp 20   Wt (!) 172.4 kg   SpO2 100%   BMI 50.13 kg/m  Physical Exam Vitals and nursing note reviewed.  Constitutional:      Appearance: Normal appearance. He is well-developed.  HENT:     Head: Atraumatic.     Right Ear: Tympanic membrane normal.     Left Ear: Tympanic membrane normal.     Nose: Nose normal.     Mouth/Throat:     Mouth: Mucous membranes are moist.     Pharynx: Oropharynx is clear.  Eyes:     General: No scleral icterus.    Conjunctiva/sclera: Conjunctivae normal.     Pupils: Pupils are equal, round, and reactive to light.  Neck:     Trachea: No tracheal deviation.     Comments: No stiffness or rigidity.  Cardiovascular:     Rate and Rhythm: Normal rate and regular rhythm.     Pulses: Normal pulses.     Heart sounds: Normal heart sounds. No murmur heard.    No friction rub. No gallop.  Pulmonary:     Effort: Pulmonary effort is normal. No accessory muscle usage or respiratory distress.     Breath sounds: Normal breath sounds.     Comments: Large body habitus, generally faint breath sounds bil, ?sl wheeze.  Abdominal:     General: Bowel sounds are normal. There is no distension.     Palpations: Abdomen is soft.     Tenderness: There is no abdominal tenderness.     Comments: Obese.   Genitourinary:    Comments: No cva tenderness. Musculoskeletal:        General: No tenderness.     Cervical back: Normal range of motion and neck supple. No rigidity.     Comments: Mild symmetric bilateral ankle edema. No asymmetric or severe leg swelling, pain or tenderness.   Lymphadenopathy:     Cervical: No cervical adenopathy.  Skin:    General: Skin is warm and dry.     Findings: No rash.  Neurological:     Mental  Status: He is alert.     Comments: Alert, speech clear.   Psychiatric:        Mood and Affect: Mood normal.     ED Results / Procedures / Treatments   Labs (all labs ordered are listed, but only abnormal results are displayed) Results for orders placed or performed during the hospital encounter of 10/10/23  Resp panel by RT-PCR (RSV, Flu A&B, Covid) Anterior Nasal Swab   Collection Time: 10/10/23  7:06 PM   Specimen: Anterior Nasal Swab  Result Value Ref Range   SARS Coronavirus 2 by RT  PCR NEGATIVE NEGATIVE   Influenza A by PCR NEGATIVE NEGATIVE   Influenza B by PCR NEGATIVE NEGATIVE   Resp Syncytial Virus by PCR NEGATIVE NEGATIVE  Basic metabolic panel   Collection Time: 10/10/23  7:06 PM  Result Value Ref Range   Sodium 134 (L) 135 - 145 mmol/L   Potassium 5.9 (H) 3.5 - 5.1 mmol/L   Chloride 104 98 - 111 mmol/L   CO2 21 (L) 22 - 32 mmol/L   Glucose, Bld 111 (H) 70 - 99 mg/dL   BUN 25 (H) 6 - 20 mg/dL   Creatinine, Ser 8.17 (H) 0.61 - 1.24 mg/dL   Calcium  9.3 8.9 - 10.3 mg/dL   GFR, Estimated 46 (L) >60 mL/min   Anion gap 9 5 - 15  CBC   Collection Time: 10/10/23  7:06 PM  Result Value Ref Range   WBC 6.0 4.0 - 10.5 K/uL   RBC 5.01 4.22 - 5.81 MIL/uL   Hemoglobin 14.2 13.0 - 17.0 g/dL   HCT 57.2 60.9 - 47.9 %   MCV 85.2 80.0 - 100.0 fL   MCH 28.3 26.0 - 34.0 pg   MCHC 33.3 30.0 - 36.0 g/dL   RDW 86.8 88.4 - 84.4 %   Platelets 237 150 - 400 K/uL   nRBC 0.0 0.0 - 0.2 %  Troponin I (High Sensitivity)   Collection Time: 10/10/23  7:06 PM  Result Value Ref Range   Troponin I (High Sensitivity) 7 <18 ng/L  Brain natriuretic peptide   Collection Time: 10/10/23  7:07 PM  Result Value Ref Range   B Natriuretic Peptide 11.8 0.0 - 100.0 pg/mL  Potassium   Collection Time: 10/10/23  8:10 PM  Result Value Ref Range   Potassium 6.2 (H) 3.5 - 5.1 mmol/L  Troponin I (High Sensitivity)   Collection Time: 10/10/23  8:10 PM  Result Value Ref Range   Troponin I (High  Sensitivity) 6 <18 ng/L     EKG EKG Interpretation Date/Time:  Monday October 10 2023 18:11:30 EST Ventricular Rate:  63 PR Interval:  200 QRS Duration:  98 QT Interval:  388 QTC Calculation: 397 R Axis:   34  Text Interpretation: Normal sinus rhythm No significant change since last tracing Confirmed by Bernard Drivers ([id number]) on 10/10/2023 8:01:33 PM  Radiology No results found.  Procedures Procedures    Medications Ordered in ED Medications  albuterol  (PROVENTIL ) (2.5 MG/3ML) 0.083% nebulizer solution 5 mg (5 mg Nebulization Given 10/10/23 1930)  ipratropium (ATROVENT ) nebulizer solution 0.5 mg (0.5 mg Nebulization Given 10/10/23 1930)  sodium zirconium cyclosilicate  (LOKELMA ) packet 10 g (10 g Oral Given 10/10/23 2015)  dextrose  50 % solution 50 mL (50 mLs Intravenous Given 10/10/23 2114)  calcium  gluconate inj 10% (1 g) URGENT USE ONLY! (1 g Intravenous Given 10/10/23 2123)  insulin  aspart (novoLOG ) injection 6 Units (6 Units Intravenous Given 10/10/23 2113)  sodium bicarbonate  injection 50 mEq (50 mEq Intravenous Given 10/10/23 2114)  albuterol  (PROVENTIL ) (2.5 MG/3ML) 0.083% nebulizer solution 5 mg (5 mg Nebulization Given 10/10/23 2109)  sodium chloride  0.9 % bolus 1,000 mL (1,000 mLs Intravenous New Bag/Given 10/10/23 2112)    ED Course/ Medical Decision Making/ A&P                                 Medical Decision Making Problems Addressed: Dyspnea on exertion: acute illness or injury with systemic symptoms Essential hypertension: chronic illness or injury  Hyperkalemia: acute illness or injury Non-productive cough: acute illness or injury with systemic symptoms Stage 3b chronic kidney disease (HCC): chronic illness or injury that poses a threat to life or bodily functions URI with cough and congestion: acute illness or injury with systemic symptoms that poses a threat to life or bodily functions  Amount and/or Complexity of Data Reviewed Independent Historian: spouse     Details: hx External Data Reviewed: notes. Labs: ordered. Decision-making details documented in ED Course. Radiology:  Decision-making details documented in ED Course. ECG/medicine tests: ordered and independent interpretation performed. Decision-making details documented in ED Course.  Risk Prescription drug management. Decision regarding hospitalization.   Iv ns. Continuous pulse ox and cardiac monitoring. Labs ordered/sent. Imaging ordered.   Differential diagnosis includes  . Dispo decision including potential need for admission considered - will get labs and imaging and reassess.   Reviewed nursing notes and prior charts for additional history. External reports reviewed. Pt with a couple cta studies within past year negative for PE.  Additional history from: family.   Cardiac monitor: sinus rhythm, rate 70.  Albuterol  and atrovent  neb.   Labs reviewed/interpreted by me - wbc and hgb normal. Bnp normal. Ckd. K mildly high.  (Pt is on bblocker, arb and spironolactone ).  Cal gluc, d50, albuterol , insulin , hco3 iv, ivf, lokelma  po. Initial and delta trop normal and not increasing - felt not c/w acs.  Recent Xray report  reviewed/interpreted by me - ?vascular congestion (pt with relatively large body habitus, ?possibly contributing to reading of prior study). Bnp normal, felt not c/w acute chf, pt also without increased swelling, without orthopnea.   Pt is breathing comfortably, no distress. No wheezing or increased wob. No pain or discomfort.   Rec close pcp/cardiology f/u, recheck k in one day.  Return precautions provided.          Final Clinical Impression(s) / ED Diagnoses Final diagnoses:  Non-productive cough  URI with cough and congestion  Stage 3b chronic kidney disease (HCC)  Hyperkalemia  Essential hypertension  Dyspnea on exertion    Rx / DC Orders ED Discharge Orders          Ordered    Ambulatory referral to Cardiology       Comments: If you have not  heard from the Cardiology office within the next 72 hours please call [phone].   10/10/23 2137              Bernard Drivers, MD 10/10/23 2140

## 2023-10-10 NOTE — ED Notes (Signed)
 Discharge paperwork reviewed entirely with patient, including follow up care. Pain was under control. No prescriptions were called in, but all questions were addressed.  Pt verbalized understanding as well as all parties involved. No questions or concerns voiced at the time of discharge. No acute distress noted.   Pt ambulated out to PVA without incident or assistance.  Pt advised they will notify their PCP immediately. and Pt advised they will seek followup care with a specialist and followup with their PCP.

## 2023-10-10 NOTE — ED Triage Notes (Signed)
 Shortness of breath x 1 week , worse with exertion and ambulation , had xray at [facility] today  showing no pneumonia , sent here for evaluation for possible congestive heart failure . Denies chest pain . Calvin Bailey

## 2023-10-10 NOTE — ED Notes (Signed)
 Patient states he feels better after medication administration.

## 2023-10-10 NOTE — Discharge Instructions (Addendum)
 It was our pleasure to provide your ER care today - we hope that you feel better.  Drink plenty of fluids/stay well hydrated.   From today's labs, your potassium level is high for which you were given medication in the ER - drink adequate water , eat fruits and vegetables in moderation, see attached information, and follow up with your doctor/cardiologist in the next 1 days time  - have your potassium rechecked then, and discuss any possible medication adjustment with your doctor  (if not able to be seen by your doctor in the next 1-2 days, you may return to ER tomorrow for recheck of potassium).  Return to ER right away if worse, new symptoms, high fevers, chest pain, increased trouble breathing, or other concern.

## 2023-10-11 ENCOUNTER — Telehealth: Payer: Self-pay | Admitting: Cardiology

## 2023-10-11 NOTE — Telephone Encounter (Signed)
 Pt c/o medication issue:  1. Name of Medication: isosorbide  mononitrate (IMDUR ) 30 MG 24 hr tablet   2. How are you currently taking this medication (dosage and times per day)?    3. Are you having a reaction (difficulty breathing--STAT)? no  4. What is your medication issue? Patient believe that the medication cause his potassium to be hight. Please advise

## 2023-10-11 NOTE — Telephone Encounter (Signed)
 Called pt. He states the only thing that has changed is his medications (Imdur ). He does state he is eating more fruits now. He went to the ED yesterday after being so tired while moving around. He thought it was pneumonia, was seen at Urgent care. Was told he was having signs of heart failure and should go to the ED. After lab results, pt was given mediation to decrease Potassium. Pt reports feeling a little better today. He was told to have labs redrawn today, however noo active lab order in his chart.

## 2023-10-13 NOTE — Progress Notes (Deleted)
Cardiology Clinic Note   Patient Name: Calvin Bailey Date of Encounter: 10/13/2023  Primary Care Provider:  de [place], Buren Kos, MD Primary Cardiologist:  Thomasene Ripple, DO  Patient Profile    Calvin Bailey 43 year old male presents to the clinic  today for follow-up evaluation of his chronic diastolic CHF and essential hypertension.  Past Medical History    Past Medical History:  Diagnosis Date   Arthritis    Cancer (HCC)    right kidney   CHF (congestive heart failure) (HCC)    pt. denies   Chronic kidney disease    Dizziness 09/25/2019   GERD (gastroesophageal reflux disease)    HTN (hypertension)    Obesity hypoventilation syndrome (HCC) 04/08/2014   OSA (obstructive sleep apnea) 04/08/2014   PFO (patent foramen ovale)    Pneumonia    Sleep apnea    wears Cpap   Past Surgical History:  Procedure Laterality Date   RADIOLOGY WITH ANESTHESIA Right 02/16/2023   Procedure: RIGHT RENAL MASS CRYO ABLATION;  Surgeon: Richarda Overlie, MD;  Location: [facility];  Service: Anesthesiology;  Laterality: Right;   two knee surgeries Left 1995    Allergies  Allergies  Allergen Reactions   Hydrochlorothiazide Other (See Comments)   Chlorthalidone Nausea Only   Lisinopril Cough    Coughing up blood    History of Present Illness    Calvin Bailey has a PMH of essential hypertension, ED, LVH, chronic diastolic CHF, OSA, hypogonadism, CKD stage III, dizziness, chronic cough, morbid obesity, tobacco use, and renal mass.  He was seen in follow-up by Dr. Servando Salina on 06/10/2023.  During that time he reported that he was doing okay.  He had had multiple hospital visits.  He did note that during one of his hospital visits he was experiencing chest discomfort and he was started on Imdur.  He noted that the Imdur had helped him significantly.  His blood pressure was well-controlled.  However, he had run out of the medication and did not have refills.  His Imdur was restarted.  He was continued  on hydralazine, labetalol, amlodipine, valsartan, and spironolactone.  Follow-up was planned for 2 months.  He has had several hospital visits.  He presents to the clinic today for follow-up evaluation and states***.  *** denies chest pain, shortness of breath, lower extremity edema, fatigue, palpitations, melena, hematuria, hemoptysis, diaphoresis, weakness, presyncope, syncope, orthopnea, and PND.  Essential hypertension-BP today***. Maintain blood pressure log Heart healthy low-sodium diet Continue Imdur, hydralazine, labetalol, amlodipine, valsartan, spironolactone Repeat BMP  OSA-reports compliance with CPAP.  Waking up well rested.  Discussed benefits of CPAP use and encourage compliance. Avoid supine sleeping Continue CPAP use Continue weight loss  CKD stage IIIa-creatinine*** Avoid nephrotoxic agents Follows with PCP  Morbid obesity-weight today***. Reduced calorie diet Increase physical activity as tolerated Continue semaglutide  Disposition: Follow-up with Dr. Orvilla Fus or me in 6 months.  Home Medications    Prior to Admission medications   Medication Sig Start Date End Date Taking? Authorizing Provider  acetaminophen (TYLENOL) 500 MG tablet Take 1 tablet (500 mg total) by mouth every 6 (six) hours as needed. 05/20/23   Horton, Mayer Masker, MD  allopurinol (ZYLOPRIM) 100 MG tablet TAKE 1 TABLET(100 MG) BY MOUTH DAILY 03/10/23   [place], Buren Kos, MD  amLODipine (NORVASC) 10 MG tablet TAKE 1 TABLET(10 MG) BY MOUTH DAILY 12/23/22   Tobb, Kardie, DO  colchicine 0.6 MG tablet Take 1 tablet (0.6 mg total) by  mouth 2 (two) times daily. 03/10/23   [place], Buren Kos, MD  gabapentin (NEURONTIN) 100 MG capsule Take 1 capsule (100 mg total) by mouth 3 (three) times daily for 14 days. Patient not taking: Reported on 02/09/2023 01/15/23 01/29/23  Long, Arlyss Repress, MD  hydrALAZINE (APRESOLINE) 100 MG tablet TAKE 1 TABLET(100 MG) BY MOUTH THREE TIMES DAILY 05/26/23   Chilton Si, MD   isosorbide mononitrate (IMDUR) 30 MG 24 hr tablet TAKE 1 TABLET(30 MG) BY MOUTH DAILY 06/10/23   Tobb, Kardie, DO  isosorbide mononitrate (IMDUR) 30 MG 24 hr tablet TAKE 1 TABLET BY MOUTH DAILY 09/08/23   Tobb, Kardie, DO  labetalol (NORMODYNE) 300 MG tablet TAKE 1 TABLET(300 MG) BY MOUTH TWICE DAILY 09/12/23   Tobb, Kardie, DO  OVER THE COUNTER MEDICATION Take 15 mLs by mouth every other day. [facility]    [provider]  Semaglutide-Weight Management (WEGOVY) 0.25 MG/0.5ML SOAJ Inject 0.25 mg into the skin every 7 (seven) days. Patient not taking: Reported on 06/10/2023 04/04/23   Thomasene Ripple, DO  Semaglutide-Weight Management (WEGOVY) 0.5 MG/0.5ML SOAJ Inject 0.5 mg into the skin every 7 (seven) days. Patient not taking: Reported on 06/10/2023 04/04/23   Tobb, Lavona Mound, DO  Semaglutide-Weight Management (WEGOVY) 1 MG/0.5ML SOAJ Inject 1 mg into the skin every 7 (seven) days. 08/17/23   Tobb, Kardie, DO  spironolactone (ALDACTONE) 25 MG tablet TAKE 1 TABLET(25 MG) BY MOUTH DAILY 06/24/23   Tobb, Kardie, DO  valsartan (DIOVAN) 160 MG tablet TAKE 1 TABLET(160 MG) BY MOUTH TWICE DAILY 07/26/23   Tobb, Kardie, DO  LISINOPRIL-HYDROCHLOROTHIAZIDE PO Take by mouth.  07/06/19  [provider]    Family History    Family History  Problem Relation Age of Onset   Hypertension Mother    Multiple sclerosis Mother    Hypertension Father    Hypertension Brother    Hyperlipidemia Paternal Grandfather    Heart failure Paternal Grandfather    Diabetes Maternal Grandmother    Heart failure Maternal Grandfather    CVA Maternal Uncle    He indicated that his mother is alive. He indicated that his father is alive. He indicated that the status of his brother is unknown. He indicated that the status of his maternal grandmother is unknown. He indicated that the status of his maternal grandfather is unknown. He indicated that the status of his paternal grandfather is unknown. He indicated that the status  of his maternal uncle is unknown.  Social History    Social History   Socioeconomic History   Marital status: Single    Spouse name: Not on file   Number of children: 2   Years of education: college   Highest education level: Some college, no degree  Occupational History   Occupation: unemployment    Associate Professor: UNEMPLOYED    Employer: [employer]  Tobacco Use   Smoking status: Former    Current packs/day: 0.00    Average packs/day: 0.1 packs/day for 16.0 years (1.6 ttl pk-yrs)    Types: Cigarettes    Start date: 06/08/2006    Quit date: 06/08/2022    Years since quitting: 1.3   Smokeless tobacco: Never  Vaping Use   Vaping status: Never Used  Substance and Sexual Activity   Alcohol use: Not Currently    Comment: sparingly    Drug use: No   Sexual activity: Yes    Partners: Female  Other Topics Concern   Not on file  Social History Narrative  Corporate investment banker   Patient is single and lives alone.   Patient is left-handed.   Patient does not drink any caffeine.   Social Drivers of Corporate investment banker Strain: Low Risk  (07/01/2022)   Overall Financial Resource Strain (CARDIA)    Difficulty of Paying Living Expenses: Not hard at all  Food Insecurity: No Food Insecurity (11/20/2022)   Hunger Vital Sign    Worried About Running Out of Food in the Last Year: Never true    Ran Out of Food in the Last Year: Never true  Transportation Needs: Unknown (11/20/2022)   PRAPARE - Administrator, Civil Service (Medical): Not on file    Lack of Transportation (Non-Medical): No  Physical Activity: Insufficiently Active (03/10/2023)   Exercise Vital Sign    Days of Exercise per Week: 4 days    Minutes of Exercise per Session: 20 min  Stress: No Stress Concern Present (07/01/2022)   [employer] of Occupational Health - Occupational Stress Questionnaire    Feeling of Stress : Only a little  Social Connections: Socially Isolated (07/01/2022)   Social  Connection and Isolation Panel [NHANES]    Frequency of Communication with Friends and Family: Once a week    Frequency of Social Gatherings with Friends and Family: Once a week    Attends Religious Services: 1 to 4 times per year    Active Member of [employer] or Organizations: No    Attends Banker Meetings: Never    Marital Status: Never married  Intimate Partner Violence: Not At Risk (11/20/2022)   Humiliation, Afraid, Rape, and Kick questionnaire    Fear of Current or Ex-Partner: No    Emotionally Abused: No    Physically Abused: No    Sexually Abused: No     Review of Systems    General:  No chills, fever, night sweats or weight changes.  Cardiovascular:  No chest pain, dyspnea on exertion, edema, orthopnea, palpitations, paroxysmal nocturnal dyspnea. Dermatological: No rash, lesions/masses Respiratory: No cough, dyspnea Urologic: No hematuria, dysuria Abdominal:   No nausea, vomiting, diarrhea, bright red blood per rectum, melena, or hematemesis Neurologic:  No visual changes, wkns, changes in mental status. All other systems reviewed and are otherwise negative except as noted above.  Physical Exam    VS:  There were no vitals taken for this visit. , BMI There is no height or weight on file to calculate BMI. GEN: Well nourished, well developed, in no acute distress. HEENT: normal. Neck: Supple, no JVD, carotid bruits, or masses. Cardiac: RRR, no murmurs, rubs, or gallops. No clubbing, cyanosis, edema.  Radials/DP/PT 2+ and equal bilaterally.  Respiratory:  Respirations regular and unlabored, clear to auscultation bilaterally. GI: Soft, nontender, nondistended, BS + x 4. MS: no deformity or atrophy. Skin: warm and dry, no rash. Neuro:  Strength and sensation are intact. Psych: Normal affect.  Accessory Clinical Findings    Recent Labs: 11/23/2022: Magnesium 2.7 03/10/2023: TSH 0.296 05/20/2023: ALT 13 10/10/2023: B Natriuretic Peptide 11.8; BUN 25; Creatinine,  Ser 1.82; Hemoglobin 14.2; Platelets 237; Potassium 6.2; Sodium 134   Recent Lipid Panel    Component Value Date/Time   CHOL 136 02/27/2022 0756   CHOL 149 05/30/2019 1500   TRIG 74 02/27/2022 0756   HDL 28 (L) 02/27/2022 0756   HDL 34 (L) 05/30/2019 1500   CHOLHDL 4.9 02/27/2022 0756   VLDL 15 02/27/2022 0756   LDLCALC 93 02/27/2022 0756   LDLCALC 93 05/30/2019 1500  No BP recorded.  {Refresh Note OR Click here to enter BP  :1}***    ECG personally reviewed by me today- ***     Echocardiogram 11/20/2022  IMPRESSIONS     1. Left ventricular ejection fraction, by estimation, is 60 to 65%. The  left ventricle has normal function. The left ventricle has no regional  wall motion abnormalities. There is mild concentric left ventricular  hypertrophy. Left ventricular diastolic  parameters were normal.   2. Right ventricular systolic function is normal. The right ventricular  size is normal. There is normal pulmonary artery systolic pressure.   3. The mitral valve is normal in structure. Trivial mitral valve  regurgitation. No evidence of mitral stenosis.   4. The aortic valve is tricuspid. Aortic valve regurgitation is mild. No  aortic stenosis is present.   5. The inferior vena cava is normal in size with greater than 50%  respiratory variability, suggesting right atrial pressure of 3 mmHg.   Comparison(s): No significant change from prior study.   FINDINGS   Left Ventricle: Left ventricular ejection fraction, by estimation, is 60  to 65%. The left ventricle has normal function. The left ventricle has no  regional wall motion abnormalities. Definity contrast agent was given IV  to delineate the left ventricular   endocardial borders. The left ventricular internal cavity size was normal  in size. There is mild concentric left ventricular hypertrophy. Left  ventricular diastolic parameters were normal.   Right Ventricle: The right ventricular size is normal. No increase  in  right ventricular wall thickness. Right ventricular systolic function is  normal. There is normal pulmonary artery systolic pressure. The tricuspid  regurgitant velocity is 2.24 m/s, and   with an assumed right atrial pressure of 3 mmHg, the estimated right  ventricular systolic pressure is 23.1 mmHg.   Left Atrium: Left atrial size was normal in size.   Right Atrium: Right atrial size was normal in size.   Pericardium: There is no evidence of pericardial effusion.   Mitral Valve: The mitral valve is normal in structure. Trivial mitral  valve regurgitation. No evidence of mitral valve stenosis.   Tricuspid Valve: The tricuspid valve is normal in structure. Tricuspid  valve regurgitation is trivial.   Aortic Valve: The aortic valve is tricuspid. Aortic valve regurgitation is  mild. Aortic regurgitation PHT measures 572 msec. No aortic stenosis is  present. Aortic valve mean gradient measures 6.0 mmHg. Aortic valve peak  gradient measures 10.4 mmHg.  Aortic valve area, by VTI measures 2.98 cm.   Pulmonic Valve: The pulmonic valve was normal in structure. Pulmonic valve  regurgitation is trivial.   Aorta: The aortic root is normal in size and structure.   Venous: The inferior vena cava is normal in size with greater than 50%  respiratory variability, suggesting right atrial pressure of 3 mmHg.   IAS/Shunts: The atrial septum is grossly normal.      Assessment & Plan   1.  ***   Thomasene Ripple. Cythina Mickelsen NP-C     10/13/2023, 1:23 PM [facility] [address] Office [phone] Fax [phone]    I spent***minutes examining this patient, reviewing medications, and using patient centered shared decision making involving their cardiac care.   I spent greater than 20 minutes reviewing their past medical history,  medications, and prior cardiac tests.

## 2023-10-17 ENCOUNTER — Ambulatory Visit: Payer: 59 | Admitting: General Practice

## 2023-10-19 NOTE — ED Provider Notes (Signed)
Had seen patient at [facility] 1/13, and referred to next day pcp f/u.  In reviewing recent charts, I noticed no updates in EPIC.    Today, I called patient and was able to reach Calvin Bailey. He indicates is feeling fine, no new/worsening symptoms. He indicates pcp office did not give f/u until next month. I re-iterated importance of prompt K recheck, and indicated may go to one of our urgent cares (or free-standing EDs if not able to get to urgent care during open hours), and discussed locations, today for that recheck. Pt indicates he will do so.        Cathren Laine, MD 10/19/23 1122

## 2023-10-31 ENCOUNTER — Ambulatory Visit: Payer: 59 | Attending: Physician Assistant | Admitting: Physician Assistant

## 2023-10-31 NOTE — Progress Notes (Unsigned)
 This encounter was created in error - please disregard.

## 2023-12-02 ENCOUNTER — Encounter: Payer: Self-pay | Admitting: Cardiology

## 2023-12-02 ENCOUNTER — Ambulatory Visit: Payer: 59 | Attending: Cardiology | Admitting: Cardiology

## 2023-12-02 ENCOUNTER — Telehealth: Payer: Self-pay

## 2023-12-02 VITALS — BP 148/100 | HR 65 | Ht 73.0 in | Wt 391.4 lb

## 2023-12-02 DIAGNOSIS — I10 Essential (primary) hypertension: Secondary | ICD-10-CM | POA: Diagnosis not present

## 2023-12-02 DIAGNOSIS — F17200 Nicotine dependence, unspecified, uncomplicated: Secondary | ICD-10-CM

## 2023-12-02 DIAGNOSIS — R1310 Dysphagia, unspecified: Secondary | ICD-10-CM | POA: Insufficient documentation

## 2023-12-02 DIAGNOSIS — Z0000 Encounter for general adult medical examination without abnormal findings: Secondary | ICD-10-CM

## 2023-12-02 DIAGNOSIS — N183 Chronic kidney disease, stage 3 unspecified: Secondary | ICD-10-CM

## 2023-12-02 DIAGNOSIS — I1A0 Resistant hypertension: Secondary | ICD-10-CM

## 2023-12-02 MED ORDER — ISOSORBIDE MONONITRATE ER 30 MG PO TB24: 30.0000 mg | ORAL_TABLET | Freq: Every day | ORAL | 3 refills | Status: DC

## 2023-12-02 MED ORDER — HYDRALAZINE HCL 100 MG PO TABS: 100.0000 mg | ORAL_TABLET | Freq: Three times a day (TID) | ORAL | 3 refills | Status: DC

## 2023-12-02 NOTE — Telephone Encounter (Signed)
 Called patient per health coaching referral for smoking cessation from Dr. Servando Salina. Patient stated that Fridays works best for him. Patient has been scheduled for his initial health coaching appointment over the phone per his request on 12/09/23 at 9:00am. Patient will be mailed/emailed the welcome letter/code of ethics and Quit4Life workbook for his review.   Renaee Munda, MS, ERHD, [facility]  Care Guide, Health & Wellness Coach [address] [place] [address] Telephone: [phone] Email: [email or web address]

## 2023-12-02 NOTE — Progress Notes (Signed)
 Cardiology Office Note:    Date:  12/02/2023   ID:  Calvin Bailey, DOB Jun 16, 1981, MRN [id number]  PCP:  de [place], [name], MD  Cardiologist:  Thomasene Ripple, DO  Electrophysiologist:  None   Referring MD: de [place], [name], MD   " I am doing ok"  History of Present Illness:    Calvin Bailey is a 43 y.o. male with a hx of chronic hypertension has been on multiple antihypertensive, tobacco use, chronic kidney disease, obstructive sleep apnea, dyslipidemia and obesity here today for follow-up visit.  Since his visit with me he has been working with bariatric surgeon at [facility].   He is planning to undergo bariatric surgery in the near future. He is currently on amlodipine and hydralazine for blood pressure control, but has run out of isosorbide. The patient reports that his blood pressure has been well-controlled with this regimen, with readings at home around 140/76 or 137/76. The patient is not on any blood thinners or diabetes medication. The patient is also a smoker and has been referred to a digital smoking cessation program.    Past Medical History:  Diagnosis Date   Arthritis    Cancer (HCC)    right kidney   CHF (congestive heart failure) (HCC)    pt. denies   Chronic kidney disease    Dizziness 09/25/2019   GERD (gastroesophageal reflux disease)    HTN (hypertension)    Obesity hypoventilation syndrome (HCC) 04/08/2014   OSA (obstructive sleep apnea) 04/08/2014   PFO (patent foramen ovale)    Pneumonia    Sleep apnea    wears Cpap    Past Surgical History:  Procedure Laterality Date   RADIOLOGY WITH ANESTHESIA Right 02/16/2023   Procedure: RIGHT RENAL MASS CRYO ABLATION;  Surgeon: Richarda Overlie, MD;  Location: [facility];  Service: Anesthesiology;  Laterality: Right;   two knee surgeries Left 1995    Current Medications: Current Meds  Medication Sig   acetaminophen (TYLENOL) 500 MG tablet Take 1 tablet (500 mg total) by mouth every 6 (six) hours as needed.    allopurinol (ZYLOPRIM) 100 MG tablet TAKE 1 TABLET(100 MG) BY MOUTH DAILY   amLODipine (NORVASC) 10 MG tablet TAKE 1 TABLET(10 MG) BY MOUTH DAILY   colchicine 0.6 MG tablet Take 1 tablet (0.6 mg total) by mouth 2 (two) times daily.   isosorbide mononitrate (IMDUR) 30 MG 24 hr tablet TAKE 1 TABLET(30 MG) BY MOUTH DAILY   labetalol (NORMODYNE) 300 MG tablet TAKE 1 TABLET(300 MG) BY MOUTH TWICE DAILY   OVER THE COUNTER MEDICATION Take 15 mLs by mouth every other day. Soursop   spironolactone (ALDACTONE) 25 MG tablet TAKE 1 TABLET(25 MG) BY MOUTH DAILY   valsartan (DIOVAN) 160 MG tablet TAKE 1 TABLET(160 MG) BY MOUTH TWICE DAILY   [DISCONTINUED] hydrALAZINE (APRESOLINE) 100 MG tablet TAKE 1 TABLET(100 MG) BY MOUTH THREE TIMES DAILY   [DISCONTINUED] isosorbide mononitrate (IMDUR) 30 MG 24 hr tablet TAKE 1 TABLET BY MOUTH DAILY     Allergies:   Hydrochlorothiazide, Chlorthalidone, and Lisinopril   Social History   Socioeconomic History   Marital status: Single    Spouse name: Not on file   Number of children: 2   Years of education: college   Highest education level: Some college, no degree  Occupational History   Occupation: unemployment    Associate Professor: UNEMPLOYED    Employer: [employer]  Tobacco Use   Smoking status: Former    Current packs/day: 0.00  Average packs/day: 0.1 packs/day for 16.0 years (1.6 ttl pk-yrs)    Types: Cigarettes    Start date: 06/08/2006    Quit date: 06/08/2022    Years since quitting: 1.4   Smokeless tobacco: Never  Vaping Use   Vaping status: Never Used  Substance and Sexual Activity   Alcohol use: Not Currently    Comment: sparingly    Drug use: No   Sexual activity: Yes    Partners: Female  Other Topics Concern   Not on file  Social History Narrative   Corporate investment banker   Patient is single and lives alone.   Patient is left-handed.   Patient does not drink any caffeine.   Social Drivers of Corporate investment banker Strain: Low Risk   (11/30/2023)   Received from [facility]   Overall Financial Resource Strain (CARDIA)    Difficulty of Paying Living Expenses: Not hard at all  Food Insecurity: No Food Insecurity (11/30/2023)   Received from [facility]   Hunger Vital Sign    Worried About Running Out of Food in the Last Year: Never true    Ran Out of Food in the Last Year: Never true  Transportation Needs: No Transportation Needs (11/30/2023)   Received from [facility] - Transportation    Lack of Transportation (Medical): No    Lack of Transportation (Non-Medical): No  Physical Activity: Insufficiently Active (03/10/2023)   Exercise Vital Sign    Days of Exercise per Week: 4 days    Minutes of Exercise per Session: 20 min  Stress: No Stress Concern Present (07/01/2022)   [employer] of Occupational Health - Occupational Stress Questionnaire    Feeling of Stress : Only a little  Social Connections: Socially Isolated (07/01/2022)   Social Connection and Isolation Panel [NHANES]    Frequency of Communication with Friends and Family: Once a week    Frequency of Social Gatherings with Friends and Family: Once a week    Attends Religious Services: 1 to 4 times per year    Active Member of [employer] or Organizations: No    Attends Engineer, structural: Never    Marital Status: Never married     Family History: The patient's family history includes CVA in his maternal uncle; Diabetes in his maternal grandmother; Heart failure in his maternal grandfather and paternal grandfather; Hyperlipidemia in his paternal grandfather; Hypertension in his brother, father, and mother; Multiple sclerosis in his mother.  ROS:   Review of Systems  Constitution: Negative for decreased appetite, fever and weight gain.  HENT: Negative for congestion, ear discharge, hoarse voice and sore throat.   Eyes: Negative for discharge, redness, vision loss in right eye and visual halos.  Cardiovascular: Negative for chest pain,  dyspnea on exertion, leg swelling, orthopnea and palpitations.  Respiratory: Negative for cough, hemoptysis, shortness of breath and snoring.   Endocrine: Negative for heat intolerance and polyphagia.  Hematologic/Lymphatic: Negative for bleeding problem. Does not bruise/bleed easily.  Skin: Negative for flushing, nail changes, rash and suspicious lesions.  Musculoskeletal: Negative for arthritis, joint pain, muscle cramps, myalgias, neck pain and stiffness.  Gastrointestinal: Negative for abdominal pain, bowel incontinence, diarrhea and excessive appetite.  Genitourinary: Negative for decreased libido, genital sores and incomplete emptying.  Neurological: Negative for brief paralysis, focal weakness, headaches and loss of balance.  Psychiatric/Behavioral: Negative for altered mental status, depression and suicidal ideas.  Allergic/Immunologic: Negative for HIV exposure and persistent infections.    EKGs/Labs/Other Studies Reviewed:  The following studies were reviewed today:   EKG:  The ekg ordered today demonstrates   Recent Labs: 03/10/2023: TSH 0.296 05/20/2023: ALT 13 10/10/2023: B Natriuretic Peptide 11.8; BUN 25; Creatinine, Ser 1.82; Hemoglobin 14.2; Platelets 237; Potassium 6.2; Sodium 134  Recent Lipid Panel    Component Value Date/Time   CHOL 136 02/27/2022 0756   CHOL 149 05/30/2019 1500   TRIG 74 02/27/2022 0756   HDL 28 (L) 02/27/2022 0756   HDL 34 (L) 05/30/2019 1500   CHOLHDL 4.9 02/27/2022 0756   VLDL 15 02/27/2022 0756   LDLCALC 93 02/27/2022 0756   LDLCALC 93 05/30/2019 1500    Physical Exam:    VS:  BP (!) 148/100   Pulse 65   Ht 6\' 1"  (1.854 m)   Wt (!) 391 lb 6.4 oz (177.5 kg)   SpO2 96%   BMI 51.64 kg/m     Wt Readings from Last 3 Encounters:  12/02/23 (!) 391 lb 6.4 oz (177.5 kg)  10/10/23 (!) 380 lb (172.4 kg)  06/10/23 (!) 395 lb 6.4 oz (179.4 kg)     GEN: Well nourished, well developed in no acute distress HEENT: Normal NECK: No JVD;  No carotid bruits LYMPHATICS: No lymphadenopathy CARDIAC: S1S2 noted,RRR, no murmurs, rubs, gallops RESPIRATORY:  Clear to auscultation without rales, wheezing or rhonchi  ABDOMEN: Soft, non-tender, non-distended, +bowel sounds, no guarding. EXTREMITIES: No edema, No cyanosis, no clubbing MUSCULOSKELETAL:  No deformity  SKIN: Warm and dry NEUROLOGIC:  Alert and oriented x 3, non-focal PSYCHIATRIC:  Normal affect, good insight  ASSESSMENT:    1. Needs smoking cessation education     PLAN:    Hypertension Blood pressure elevated at 148/100. Patient has been non-compliant with Isosorbide and Hydralazine dosing. Discussed the importance of consistent medication use and the potential benefits of upcoming bariatric surgery on hypertension management. -Resume Isosorbide, refill prescription. -Increase Hydralazine 100mg  ( a pil and a half daily) so 150mg  twice a day. He was not able to take 100mg  TID.   Smoking Cessation Patient is a current smoker. Discussed the benefits of smoking cessation, especially in the context of upcoming surgery. -Refer to health coach,  for smoking cessation support.  Bariatric Surgery Scheduled for the beginning of May. Discussed the potential benefits on hypertension and overall health. -Complete necessary paperwork for surgery. -Plan for follow-up post-surgery.  Chronic Kidney Disease No changes or new symptoms reported. -Continue current management plan.  Avoid nephrotoxin  I completed a form for the patient for his bariatric surgeon. He denies any symptoms of chest pain. The patient does not have any unstable cardiac conditions.  Upon evaluation today, she can achieve 4 METs or greater without anginal symptoms.  According to [facility] and AHA guidelines, she requires no further cardiac workup prior to his noncardiac surgery and should be at acceptable risk.  Our service is available as necessary in the perioperative period.  The patient is in agreement with  the above plan. The patient left the office in stable condition.  The patient will follow up in   Medication Adjustments/Labs and Tests Ordered: Current medicines are reviewed at length with the patient today.  Concerns regarding medicines are outlined above.  Orders Placed This Encounter  Procedures   Referral to HRT/VAS Care Navigation   Meds ordered this encounter  Medications   isosorbide mononitrate (IMDUR) 30 MG 24 hr tablet    Sig: Take 1 tablet (30 mg total) by mouth daily.    Dispense:  90 tablet  Refill:  3    Please send a replace/new response with 90-Day Supply if appropriate to maximize member benefit. Requesting 1 year supply.   hydrALAZINE (APRESOLINE) 100 MG tablet    Sig: Take 1 tablet (100 mg total) by mouth 3 (three) times daily.    Dispense:  270 tablet    Refill:  3    Patient Instructions  Medication Instructions:  Your physician has recommended you make the following change in your medication:  INCREASE Hydralazine 100 mg three times daily *If you need a refill on your cardiac medications before your next appointment, please call your pharmacy*    Follow-Up: At [facility], you and your health needs are our priority.  As part of our continuing mission to provide you with exceptional heart care, we have created designated Provider Care Teams.  These Care Teams include your primary Cardiologist (physician) and Advanced Practice Providers (APPs -  Physician Assistants and Nurse Practitioners) who all work together to provide you with the care you need, when you need it.  Your next appointment:   6 month(s)  Provider:   Thomasene Ripple, DO     Other Instructions Please see our staff Renaee Munda in regards to smoking cessation.   1-800-QUIT-NOW ([phone])   If you interested in 1-1 telephonic tobacco cessation coaching with [employer], please call [phone] to enroll today.   Steps to Quit Smoking Smoking tobacco is the  leading cause of preventable death. It can affect almost every organ in the body. Smoking puts you and people around you at risk for many serious, long-lasting (chronic) diseases. Quitting smoking can be hard, but it is one of the best things that you can do for your health. It is never too late to quit. Do not give up if you cannot quit the first time. Some people need to try many times to quit. Do your best to stick to your quit plan, and talk with your doctor if you have any questions or concerns. How do I get ready to quit? Pick a date to quit. Set a date within the next 2 weeks to give you time to prepare. Write down the reasons why you are quitting. Keep this list in places where you will see it often. Tell your family, friends, and co-workers that you are quitting. Their support is important. Talk with your doctor about the choices that may help you quit. Find out if your health insurance will pay for these treatments. Know the people, places, things, and activities that make you want to smoke (triggers). Avoid them. What first steps can I take to quit smoking? Throw away all cigarettes at home, at work, and in your car. Throw away the things that you use when you smoke, such as ashtrays and lighters. Clean your car. Empty the ashtray. Clean your home, including curtains and carpets. What can I do to help me quit smoking? Talk with your doctor about taking medicines and seeing a counselor. You are more likely to succeed when you do both. If you are pregnant or breastfeeding: Talk with your doctor about counseling or other ways to quit smoking. Do not take medicine to help you quit smoking unless your doctor tells you to. Quit right away Quit smoking completely, instead of slowly cutting back on how much you smoke over a period of time. Stopping smoking right away may be more successful than slowly quitting. Go to counseling. In-person is best if this is an option. You are  more likely to  quit if you go to counseling sessions regularly. Take medicine You may take medicines to help you quit. Some medicines need a prescription, and some you can buy over-the-counter. Some medicines may contain a drug called nicotine to replace the nicotine in cigarettes. Medicines may: Help you stop having the desire to smoke (cravings). Help to stop the problems that come when you stop smoking (withdrawal symptoms). Your doctor may ask you to use: Nicotine patches, gum, or lozenges. Nicotine inhalers or sprays. Non-nicotine medicine that you take by mouth. Find resources Find resources and other ways to help you quit smoking and remain smoke-free after you quit. They include: Online chats with a Veterinary surgeon. Phone quitlines. Printed Materials engineer. Support groups or group counseling. Text messaging programs. Mobile phone apps. Use apps on your mobile phone or tablet that can help you stick to your quit plan. Examples of free services include Quit Guide from the CDC and smokefree.gov  What can I do to make it easier to quit?  Talk to your family and friends. Ask them to support and encourage you. Call a phone quitline, such as 1-800-QUIT-NOW, reach out to support groups, or work with a Veterinary surgeon. Ask people who smoke to not smoke around you. Avoid places that make you want to smoke, such as: Bars. Parties. Smoke-break areas at work. Spend time with people who do not smoke. Lower the stress in your life. Stress can make you want to smoke. Try these things to lower stress: Getting regular exercise. Doing deep-breathing exercises. Doing yoga. Meditating. What benefits will I see if I quit smoking? Over time, you may have: A better sense of smell and taste. Less coughing and sore throat. A slower heart rate. Lower blood pressure. Clearer skin. Better breathing. Fewer sick days. Summary Quitting smoking can be hard, but it is one of the best things that you can do for your  health. Do not give up if you cannot quit the first time. Some people need to try many times to quit. When you decide to quit smoking, make a plan to help you succeed. Quit smoking right away, not slowly over a period of time. When you start quitting, get help and support to keep you smoke-free. This information is not intended to replace advice given to you by your health care provider. Make sure you discuss any questions you have with your health care provider. Document Revised: 09/04/2021 Document Reviewed: 09/04/2021 Elsevier Patient Education  2023 Elsevier Inc.        Adopting a Healthy Lifestyle.  Know what a healthy weight is for you (roughly BMI <25) and aim to maintain this   Aim for 7+ servings of fruits and vegetables daily   65-80+ fluid ounces of water or unsweet tea for healthy kidneys   Limit to max 1 drink of alcohol per day; avoid smoking/tobacco   Limit animal fats in diet for cholesterol and heart health - choose grass fed whenever available   Avoid highly processed foods, and foods high in saturated/trans fats   Aim for low stress - take time to unwind and care for your mental health   Aim for 150 min of moderate intensity exercise weekly for heart health, and weights twice weekly for bone health   Aim for 7-9 hours of sleep daily   When it comes to diets, agreement about the perfect plan isnt easy to find, even among the experts. Experts at the [facility] [employer] developed an idea  known as the Healthy Eating Plate. Just imagine a plate divided into logical, healthy portions.   The emphasis is on diet quality:   Load up on vegetables and fruits - one-half of your plate: Aim for color and variety, and remember that potatoes dont count.   Go for whole grains - one-quarter of your plate: Whole wheat, barley, wheat berries, quinoa, oats, brown rice, and foods made with them. If you want pasta, go with whole wheat pasta.   Protein power -  one-quarter of your plate: Fish, chicken, beans, and nuts are all healthy, versatile protein sources. Limit red meat.   The diet, however, does go beyond the plate, offering a few other suggestions.   Use healthy plant oils, such as olive, canola, soy, corn, sunflower and peanut. Check the labels, and avoid partially hydrogenated oil, which have unhealthy trans fats.   If youre thirsty, drink water. Coffee and tea are good in moderation, but skip sugary drinks and limit milk and dairy products to one or two daily servings.   The type of carbohydrate in the diet is more important than the amount. Some sources of carbohydrates, such as vegetables, fruits, whole grains, and beans-are healthier than others.   Finally, stay active  Signed, Thomasene Ripple, DO  12/02/2023 10:32 PM    [facility]

## 2023-12-02 NOTE — Patient Instructions (Signed)
 Medication Instructions:  Your physician has recommended you make the following change in your medication:  INCREASE Hydralazine 100 mg three times daily *If you need a refill on your cardiac medications before your next appointment, please call your pharmacy*    Follow-Up: At [facility], you and your health needs are our priority.  As part of our continuing mission to provide you with exceptional heart care, we have created designated Provider Care Teams.  These Care Teams include your primary Cardiologist (physician) and Advanced Practice Providers (APPs -  Physician Assistants and Nurse Practitioners) who all work together to provide you with the care you need, when you need it.  Your next appointment:   6 month(s)  Provider:   Thomasene Ripple, DO     Other Instructions Please see our staff Renaee Munda in regards to smoking cessation.   1-800-QUIT-NOW [phone])   If you interested in 1-1 telephonic tobacco cessation coaching with [employer], please call [phone] to enroll today.   Steps to Quit Smoking Smoking tobacco is the leading cause of preventable death. It can affect almost every organ in the body. Smoking puts you and people around you at risk for many serious, long-lasting (chronic) diseases. Quitting smoking can be hard, but it is one of the best things that you can do for your health. It is never too late to quit. Do not give up if you cannot quit the first time. Some people need to try many times to quit. Do your best to stick to your quit plan, and talk with your doctor if you have any questions or concerns. How do I get ready to quit? Pick a date to quit. Set a date within the next 2 weeks to give you time to prepare. Write down the reasons why you are quitting. Keep this list in places where you will see it often. Tell your family, friends, and co-workers that you are quitting. Their support is important. Talk with your doctor about the  choices that may help you quit. Find out if your health insurance will pay for these treatments. Know the people, places, things, and activities that make you want to smoke (triggers). Avoid them. What first steps can I take to quit smoking? Throw away all cigarettes at home, at work, and in your car. Throw away the things that you use when you smoke, such as ashtrays and lighters. Clean your car. Empty the ashtray. Clean your home, including curtains and carpets. What can I do to help me quit smoking? Talk with your doctor about taking medicines and seeing a counselor. You are more likely to succeed when you do both. If you are pregnant or breastfeeding: Talk with your doctor about counseling or other ways to quit smoking. Do not take medicine to help you quit smoking unless your doctor tells you to. Quit right away Quit smoking completely, instead of slowly cutting back on how much you smoke over a period of time. Stopping smoking right away may be more successful than slowly quitting. Go to counseling. In-person is best if this is an option. You are more likely to quit if you go to counseling sessions regularly. Take medicine You may take medicines to help you quit. Some medicines need a prescription, and some you can buy over-the-counter. Some medicines may contain a drug called nicotine to replace the nicotine in cigarettes. Medicines may: Help you stop having the desire to smoke (cravings). Help to stop the problems that come when you  stop smoking (withdrawal symptoms). Your doctor may ask you to use: Nicotine patches, gum, or lozenges. Nicotine inhalers or sprays. Non-nicotine medicine that you take by mouth. Find resources Find resources and other ways to help you quit smoking and remain smoke-free after you quit. They include: Online chats with a Veterinary surgeon. Phone quitlines. Printed Materials engineer. Support groups or group counseling. Text messaging programs. Mobile phone  apps. Use apps on your mobile phone or tablet that can help you stick to your quit plan. Examples of free services include Quit Guide from the CDC and smokefree.gov  What can I do to make it easier to quit?  Talk to your family and friends. Ask them to support and encourage you. Call a phone quitline, such as 1-800-QUIT-NOW, reach out to support groups, or work with a Veterinary surgeon. Ask people who smoke to not smoke around you. Avoid places that make you want to smoke, such as: Bars. Parties. Smoke-break areas at work. Spend time with people who do not smoke. Lower the stress in your life. Stress can make you want to smoke. Try these things to lower stress: Getting regular exercise. Doing deep-breathing exercises. Doing yoga. Meditating. What benefits will I see if I quit smoking? Over time, you may have: A better sense of smell and taste. Less coughing and sore throat. A slower heart rate. Lower blood pressure. Clearer skin. Better breathing. Fewer sick days. Summary Quitting smoking can be hard, but it is one of the best things that you can do for your health. Do not give up if you cannot quit the first time. Some people need to try many times to quit. When you decide to quit smoking, make a plan to help you succeed. Quit smoking right away, not slowly over a period of time. When you start quitting, get help and support to keep you smoke-free. This information is not intended to replace advice given to you by your health care provider. Make sure you discuss any questions you have with your health care provider. Document Revised: 09/04/2021 Document Reviewed: 09/04/2021 Elsevier Patient Education  2023 [employer].

## 2023-12-07 ENCOUNTER — Encounter: Payer: Self-pay | Admitting: Cardiology

## 2023-12-09 ENCOUNTER — Other Ambulatory Visit: Payer: Self-pay | Admitting: Diagnostic Radiology

## 2023-12-09 ENCOUNTER — Ambulatory Visit: Payer: Self-pay

## 2023-12-09 DIAGNOSIS — N2889 Other specified disorders of kidney and ureter: Secondary | ICD-10-CM

## 2023-12-09 DIAGNOSIS — C641 Malignant neoplasm of right kidney, except renal pelvis: Secondary | ICD-10-CM

## 2023-12-09 DIAGNOSIS — Z0000 Encounter for general adult medical examination without abnormal findings: Secondary | ICD-10-CM

## 2023-12-09 NOTE — Progress Notes (Signed)
 HEALTH & WELLNESS COACHING INITIAL INTAKE   Appointment Outcome: Completed, Session #: Initial                         Start time: 9:01am   End time: 9:49am   Total Mins: 48 minutes     What are the Patient's goals from Coaching? Patient wants to quit smoking in the next 30 days.  Why did they seek coaching now? Patient has attempted to quit smoking previously and was able to stay smoke free for 32 days but relapsed. Patient is seeking coaching now to develop strategies to quit smoking to be eligible for bariatric surgery and remain smoke free moving forward.   Readiness - What stage is the patient in regarding their goal(s)? Patient is in the preparation stage of smoking cessation.     Coaching Progress Notes: Patient reported that he smokes about a pack of cigarettes daily. Patient stated that the number of cigarettes he smokes depends on how stressful work is, being bored at home in the evenings/weekends, and on his days off. Patient stated that smoking relaxes him.  Patient desires to quit smoking because he is preparing to have bariatric surgery and have to be nicotine free Patient shared that he has attempted to quit smoking more than once. Patient has quit cold [place] for 14 and 32 days but resumed smoking.   Patient longest time he goes without smoking is 2 hours while working, varying depending on his stress level. Patient smokes while working, smokes right after waking up, and 30 minutes after eating. Patient stated that he confident that he can quits smoking; he has to put his mind into doing it.   Coaching Outcomes Patient decided that he wants to reduce the number of cigarettes that he smokes over time with the aid of medication (Wellbutrin) and nicotine lozenges.  Sent message to Dr. Servando Salina regarding patient's interest in medication to aid with smoking cessation.   Patient decided to engage in physical activity as a distraction from smoking and to manage stress.    Patient wants to work on extending the time to 45 minutes to 1 hours after eating before smoking.  Patient will use nicotine lozenges to help reduce smoking by breaking the habit of picking up a cigarette to smoke.    AGREEMENTS SECTION   Overall Goal(s): Quit smoking within the next 30 days to become eligible for bariatric surgery.                                                Agreement/Action Steps:  Track smoking via page 8 in [employer] mini quits - page 28 Extend time after eating by 45 minutes to 1 hour Distract self (e.g., wash dishes, call support person) Continue to smoke during work every 2 hours Walk every day during work week at [employer] days at [place] Use lozenges to reduce the number of cigarettes smoked daily Limit interactions around people when they are smoking    Patient review of Health Coaching Agreement Reviewed Coaching Agreement and Code of Ethics with Patient during initial session. Answered any questions the patient had if any regarding the Coaching Agreement and Code of Ethics. Patient verbally agreed to adhere to the Coaching Agreement and to abide by the Code of Ethics.  Mailed patient with a hard/electronic copy  of the Coaching Agreement and Code of Ethics.     Referrals: N/A  Resources: Quit4Life workbook was mailed to patient. Action steps was provided via email.

## 2023-12-09 NOTE — Progress Notes (Signed)
 Reviewed with patient during health coaching session nicotine replacement therapy and medication options to assist with smoking cessation.   Patient stated that he is interested in taking Wellbutrin to assist with smoking cessation.   Patient desires to quit smoking in the next 30 days to be eligible for bariatric surgery.  Patient smokes one pack of cigarettes per day, stress is his main trigger. Patient will also use lozenges that he has at home to help him cut back.   Renaee Munda, MS, ERHD, [facility]  Care Guide, Health & Wellness Coach [address], [address] [place] [address] Telephone: [phone] Email: [email or web address]

## 2023-12-16 ENCOUNTER — Telehealth: Payer: Self-pay

## 2023-12-16 ENCOUNTER — Ambulatory Visit: Payer: Self-pay | Attending: Cardiology

## 2023-12-16 ENCOUNTER — Other Ambulatory Visit: Payer: Self-pay

## 2023-12-16 ENCOUNTER — Ambulatory Visit
Admission: RE | Admit: 2023-12-16 | Discharge: 2023-12-16 | Disposition: A | Discharge status: Home / Self Care | Source: Ambulatory Visit | Attending: Diagnostic Radiology | Admitting: Diagnostic Radiology

## 2023-12-16 DIAGNOSIS — Z85528 Personal history of other malignant neoplasm of kidney: Secondary | ICD-10-CM | POA: Insufficient documentation

## 2023-12-16 DIAGNOSIS — N2889 Other specified disorders of kidney and ureter: Secondary | ICD-10-CM

## 2023-12-16 DIAGNOSIS — F17200 Nicotine dependence, unspecified, uncomplicated: Secondary | ICD-10-CM

## 2023-12-16 DIAGNOSIS — Z0000 Encounter for general adult medical examination without abnormal findings: Secondary | ICD-10-CM

## 2023-12-16 MED ORDER — IOPAMIDOL (ISOVUE-300) INJECTION 61%
100.0000 mL | Freq: Once | INTRAVENOUS | Status: AC | PRN
Start: 2023-12-16 — End: 2023-12-16
  Administered 2023-12-16: 100 mL via INTRAVENOUS

## 2023-12-16 NOTE — Telephone Encounter (Signed)
 Tried calling patient back for health coaching appointment. Patient stated that he had his phone on silent and missed the call. See encounter for appointment.   Renaee Munda, MS, ERHD, [facility]  Care Guide, Health & Wellness Coach [facility] Care Navigation Telephone: [phone] Email: [email or web address]

## 2023-12-16 NOTE — Telephone Encounter (Signed)
 Called patient for health coaching appointment. Patient did not answer. Left message for patient to return call to hold appointment or to reschedule.   Renaee Munda, MS, ERHD, [facility]  Care Guide, Health & Wellness Coach [facility] Heart & Vascular Care Navigation Telephone: [phone] Email: [email or web address]

## 2023-12-16 NOTE — Progress Notes (Signed)
 Appointment Outcome: Completed, Session #: 1                         Start time: 9:07am   End time: 9:42am   Total Mins: 35 minutes  AGREEMENTS SECTION   Overall Goal(s): Quit smoking within the next 30 days to become eligible for bariatric surgery.                                                 Agreement/Action Steps:  Track smoking via page 8 in [employer] mini quits - page 28 Extend time after eating by 45 minutes to 1 hour Distract self (e.g., wash dishes, call support person) Continue to smoke during work every 2 hours Walk every day during work week at [employer] days at [place] Use lozenges to reduce the number of cigarettes smoked daily Limit interactions around people when they are smoking    Progress Notes:  Patient reported that he reduced his smoking to 6 cigarettes by 3/19, and 4 cigarettes on 3/20. Patient shared that he does not have a pack of cigarettes, so he waits until the urge is unbearable to go buy a single from the store.   Patient reported not smoking first thing in the morning yesterday and was able to go a long without wanting to smoke. Patient mentioned having the urge to smoke now because he just ate.   Patient reported buying 2 singles yesterday morning, 1 single around 4pm, and 1 before the store closed. Patient stated that the day before he wanted to smoke more than 6 cigarettes, but he continued to talk himself through the urges.  Patient plans to not buy a pack of cigarettes so he does not resume to smoking more and prolong the time between going to buy a single cigarette from the store.  Patient stated that putting his mind on something else such as real estate paperwork helps some but does not take the urge to smoke away completely.   Patient recalled how quitting smoking previously helped lower his blood pressure and his provider reduced his dosage of hypertensive medication.      Coaching Outcomes: Patient decided that over the  next two weeks, he will aim to not smoke more than 4 cigarettes per day. Patient did not set a quit date but intends on not smoking while at his fianc house starting on 3/24 to help with limiting his smoking.   Patient has graduated to biweekly sessions instead of weekly sessions and have 5 biweekly sessions left due to using 1 session for one-week f/u.  Patient will continue to implement his action steps as outlined above except for using lozenges.  Patient expressed interest in using medication to assist with the withdrawal symptoms/carvings. Discussed medication options per CDC. Patient decided he wanted to try Wellbutrin.     Attempted: Fulfilled - Patient tracked smoking and practiced mini quits by extending time after eating before smoking and limiting interactions with others who smoke.   Not met - patient did not attempt walking over the past week but stated that he will start walking.    Not attempted: Dropped/Revised - Patient will not be using lozenges to reduce the number of cigarettes smoked daily.    Referrals: Sent message to Dr. Servando Salina regarding patient's interest in Wellbutrin as an aid  for smoking cessation to assist with withdrawal/cravings to determine if he was a candidate for the medication.

## 2023-12-16 NOTE — Progress Notes (Signed)
 Referral placed for smoking cessation virtual care.

## 2023-12-19 ENCOUNTER — Emergency Department (HOSPITAL_BASED_OUTPATIENT_CLINIC_OR_DEPARTMENT_OTHER): Admitting: Radiology

## 2023-12-19 ENCOUNTER — Other Ambulatory Visit: Payer: Self-pay

## 2023-12-19 ENCOUNTER — Encounter (HOSPITAL_BASED_OUTPATIENT_CLINIC_OR_DEPARTMENT_OTHER): Payer: Self-pay

## 2023-12-19 ENCOUNTER — Emergency Department (HOSPITAL_BASED_OUTPATIENT_CLINIC_OR_DEPARTMENT_OTHER)
Admission: EM | Admit: 2023-12-19 | Discharge: 2023-12-19 | Disposition: A | Discharge status: Home / Self Care | Attending: Emergency Medicine | Admitting: Emergency Medicine

## 2023-12-19 ENCOUNTER — Telehealth: Payer: Self-pay | Admitting: Cardiology

## 2023-12-19 DIAGNOSIS — I2510 Atherosclerotic heart disease of native coronary artery without angina pectoris: Secondary | ICD-10-CM | POA: Insufficient documentation

## 2023-12-19 DIAGNOSIS — R2243 Localized swelling, mass and lump, lower limb, bilateral: Secondary | ICD-10-CM | POA: Diagnosis not present

## 2023-12-19 DIAGNOSIS — J45909 Unspecified asthma, uncomplicated: Secondary | ICD-10-CM | POA: Diagnosis not present

## 2023-12-19 DIAGNOSIS — R0602 Shortness of breath: Secondary | ICD-10-CM | POA: Diagnosis present

## 2023-12-19 DIAGNOSIS — Z87891 Personal history of nicotine dependence: Secondary | ICD-10-CM | POA: Diagnosis not present

## 2023-12-19 DIAGNOSIS — R062 Wheezing: Secondary | ICD-10-CM

## 2023-12-19 LAB — CBC WITH DIFFERENTIAL/PLATELET
Abs Immature Granulocytes: 0.02 10*3/uL (ref 0.00–0.07)
Basophils Absolute: 0.1 10*3/uL (ref 0.0–0.1)
Basophils Relative: 1 %
Eosinophils Absolute: 0.3 10*3/uL (ref 0.0–0.5)
Eosinophils Relative: 4 %
HCT: 43.6 % (ref 39.0–52.0)
Hemoglobin: 14.5 g/dL (ref 13.0–17.0)
Immature Granulocytes: 0 %
Lymphocytes Relative: 24 %
Lymphs Abs: 2 10*3/uL (ref 0.7–4.0)
MCH: 28.6 pg (ref 26.0–34.0)
MCHC: 33.3 g/dL (ref 30.0–36.0)
MCV: 86 fL (ref 80.0–100.0)
Monocytes Absolute: 0.8 10*3/uL (ref 0.1–1.0)
Monocytes Relative: 10 %
Neutro Abs: 5.2 10*3/uL (ref 1.7–7.7)
Neutrophils Relative %: 61 %
Platelets: 256 10*3/uL (ref 150–400)
RBC: 5.07 MIL/uL (ref 4.22–5.81)
RDW: 13.6 % (ref 11.5–15.5)
WBC: 8.3 10*3/uL (ref 4.0–10.5)
nRBC: 0 % (ref 0.0–0.2)

## 2023-12-19 LAB — COMPREHENSIVE METABOLIC PANEL
ALT: 14 U/L (ref 0–44)
AST: 12 U/L — ABNORMAL LOW (ref 15–41)
Albumin: 4.5 g/dL (ref 3.5–5.0)
Alkaline Phosphatase: 73 U/L (ref 38–126)
Anion gap: 11 (ref 5–15)
BUN: 26 mg/dL — ABNORMAL HIGH (ref 6–20)
CO2: 22 mmol/L (ref 22–32)
Calcium: 9 mg/dL (ref 8.9–10.3)
Chloride: 106 mmol/L (ref 98–111)
Creatinine, Ser: 1.9 mg/dL — ABNORMAL HIGH (ref 0.61–1.24)
GFR, Estimated: 44 mL/min — ABNORMAL LOW (ref >60)
Glucose, Bld: 102 mg/dL — ABNORMAL HIGH (ref 70–99)
Potassium: 4.2 mmol/L (ref 3.5–5.1)
Sodium: 139 mmol/L (ref 135–145)
Total Bilirubin: 0.4 mg/dL (ref 0.0–1.2)
Total Protein: 7.5 g/dL (ref 6.5–8.1)

## 2023-12-19 LAB — BRAIN NATRIURETIC PEPTIDE: B Natriuretic Peptide: 16.6 pg/mL (ref 0.0–100.0)

## 2023-12-19 LAB — TROPONIN I (HIGH SENSITIVITY): Troponin I (High Sensitivity): 9 ng/L (ref <18)

## 2023-12-19 LAB — RESP PANEL BY RT-PCR (RSV, FLU A&B, COVID)  RVPGX2
Influenza A by PCR: NEGATIVE
Influenza B by PCR: NEGATIVE
Resp Syncytial Virus by PCR: NEGATIVE
SARS Coronavirus 2 by RT PCR: NEGATIVE

## 2023-12-19 LAB — D-DIMER, QUANTITATIVE: D-Dimer, Quant: 0.39 ug{FEU}/mL (ref 0.00–0.50)

## 2023-12-19 MED ORDER — IPRATROPIUM-ALBUTEROL 0.5-2.5 (3) MG/3ML IN SOLN
3.0000 mL | RESPIRATORY_TRACT | Status: AC
  Administered 2023-12-19: 3 mL via RESPIRATORY_TRACT
  Filled 2023-12-19: qty 3

## 2023-12-19 MED ORDER — ALBUTEROL SULFATE HFA 108 (90 BASE) MCG/ACT IN AERS
2.0000 | INHALATION_SPRAY | Freq: Once | RESPIRATORY_TRACT | Status: AC
  Administered 2023-12-19: 2 via RESPIRATORY_TRACT
  Filled 2023-12-19: qty 6.7

## 2023-12-19 MED ORDER — METHYLPREDNISOLONE SODIUM SUCC 125 MG IJ SOLR
125.0000 mg | INTRAMUSCULAR | Status: AC
  Administered 2023-12-19: 125 mg via INTRAVENOUS
  Filled 2023-12-19: qty 2

## 2023-12-19 MED ORDER — AEROCHAMBER PLUS FLO-VU MISC
1.0000 | Freq: Once | Status: AC
  Administered 2023-12-19: 1
  Filled 2023-12-19: qty 1

## 2023-12-19 MED ORDER — PREDNISONE 20 MG PO TABS: 40.0000 mg | ORAL_TABLET | Freq: Every day | ORAL | 0 refills | Status: AC

## 2023-12-19 NOTE — Discharge Instructions (Signed)
 You were seen for your shortness of breath and wheezing in the emergency department.   At home, please use your inhalers for any wheezing.  Please take the steroids we have prescribed you for your wheezing and shortness of breath as well.    Check your MyChart online for the results of any tests that had not resulted by the time you left the emergency department.   Follow-up with your primary doctor in 2-3 days regarding your visit.    Return immediately to the emergency department if you experience any of the following: Difficulty breathing, chest pain, or any other concerning symptoms.    Thank you for visiting our Emergency Department. It was a pleasure taking care of you today.

## 2023-12-19 NOTE — ED Triage Notes (Signed)
 In for eval of SOB onset yesterday with intermittent headache. Denies coughing, sore throat, ear pain, and congestion. Clear BBS.

## 2023-12-19 NOTE — Telephone Encounter (Signed)
 Patient identification verified by 2 forms. Shade Flood, RN     Called and spoke to patient  Patient states:  - SOB began yesterday evening  - has had headache along with shortness of breath  - does not check HR/ BP at home. Unable to provide this information at time of call  Patient denies:  - hx of SOB - hx of asthma  - chest pain, vision changes, weakness dizziness              Interventions/Plan: - Patient scheduled for OV with APP    Reviewed ED warning signs/precautions  Patient agrees with plan, no questions at this time

## 2023-12-19 NOTE — Telephone Encounter (Signed)
 Pt c/o Shortness Of Breath: STAT if SOB developed within the last 24 hours or pt is noticeably SOB on the phone  1. Are you currently SOB (can you hear that pt is SOB on the phone)? Yes  2. How long have you been experiencing SOB? Yesterday   3. Are you SOB when sitting or when up moving around? Both  4. Are you currently experiencing any other symptoms? No

## 2023-12-19 NOTE — ED Provider Notes (Signed)
 [place] EMERGENCY DEPARTMENT AT [facility] Provider Note   CSN: [id number] Arrival date & time: 12/19/23  1741     History  Chief Complaint  Patient presents with   Shortness of Breath    Calvin Bailey is a 43 y.o. male.  43 year old male history of CAD, asthma, and tobacco use who presents emergency department shortness of breath.  Since yesterday patient has been having shortness of breath.  Says that it is present at rest, with exertion, and when laying down.  No fevers, cough, chest pain, or new leg swelling.  Patient reports that he does have some chest tightness when he gets short of breath but denies chest pain otherwise.  Says he has a history of kidney disease and has had high potassium for this in the past.  Normal urine output recently.  Also reports history of kidney cancer that was treated.       Home Medications Prior to Admission medications   Medication Sig Start Date End Date Taking? Authorizing Provider  predniSONE (DELTASONE) 20 MG tablet Take 2 tablets (40 mg total) by mouth daily for 5 days. 12/19/23 12/24/23 Yes Rondel Baton, MD  acetaminophen (TYLENOL) 500 MG tablet Take 1 tablet (500 mg total) by mouth every 6 (six) hours as needed. 05/20/23   Horton, Mayer Masker, MD  allopurinol (ZYLOPRIM) 100 MG tablet TAKE 1 TABLET(100 MG) BY MOUTH DAILY 03/10/23   de [place], Buren Kos, MD  amLODipine (NORVASC) 10 MG tablet TAKE 1 TABLET(10 MG) BY MOUTH DAILY 12/23/22   Tobb, Kardie, DO  colchicine 0.6 MG tablet Take 1 tablet (0.6 mg total) by mouth 2 (two) times daily. 03/10/23   de [place], Buren Kos, MD  gabapentin (NEURONTIN) 100 MG capsule Take 1 capsule (100 mg total) by mouth 3 (three) times daily for 14 days. Patient not taking: Reported on 02/09/2023 01/15/23 01/29/23  Long, Arlyss Repress, MD  hydrALAZINE (APRESOLINE) 100 MG tablet Take 1 tablet (100 mg total) by mouth 3 (three) times daily. 12/02/23   Tobb, Kardie, DO  isosorbide mononitrate (IMDUR) 30 MG 24 hr  tablet TAKE 1 TABLET(30 MG) BY MOUTH DAILY 06/10/23   Tobb, Kardie, DO  isosorbide mononitrate (IMDUR) 30 MG 24 hr tablet Take 1 tablet (30 mg total) by mouth daily. 12/02/23   Tobb, Kardie, DO  labetalol (NORMODYNE) 300 MG tablet TAKE 1 TABLET(300 MG) BY MOUTH TWICE DAILY 09/12/23   Tobb, Kardie, DO  OVER THE COUNTER MEDICATION Take 15 mLs by mouth every other day. [place]    [provider]  Semaglutide-Weight Management (WEGOVY) 0.25 MG/0.5ML SOAJ Inject 0.25 mg into the skin every 7 (seven) days. Patient not taking: Reported on 12/02/2023 04/04/23   Tobb, Lavona Mound, DO  Semaglutide-Weight Management (WEGOVY) 0.5 MG/0.5ML SOAJ Inject 0.5 mg into the skin every 7 (seven) days. Patient not taking: Reported on 12/02/2023 04/04/23   Tobb, Lavona Mound, DO  Semaglutide-Weight Management (WEGOVY) 1 MG/0.5ML SOAJ Inject 1 mg into the skin every 7 (seven) days. Patient not taking: Reported on 12/02/2023 08/17/23   Tobb, Lavona Mound, DO  spironolactone (ALDACTONE) 25 MG tablet TAKE 1 TABLET(25 MG) BY MOUTH DAILY 06/24/23   Tobb, Kardie, DO  valsartan (DIOVAN) 160 MG tablet TAKE 1 TABLET(160 MG) BY MOUTH TWICE DAILY 07/26/23   Tobb, Kardie, DO  LISINOPRIL-HYDROCHLOROTHIAZIDE PO Take by mouth.  07/06/19  [provider]      Allergies    Hydrochlorothiazide, Chlorthalidone, and Lisinopril    Review of Systems   Review of  Systems  Physical Exam Updated Vital Signs BP (!) 141/73   Pulse 66   Temp 98.6 F (37 C)   Resp 20   Ht 6\' 1"  (1.854 m)   Wt (!) 172.4 kg   SpO2 97%   BMI 50.13 kg/m  Physical Exam Vitals and nursing note reviewed.  Constitutional:      General: He is not in acute distress.    Appearance: He is well-developed.  HENT:     Head: Normocephalic and atraumatic.     Right Ear: External ear normal.     Left Ear: External ear normal.     Nose: Nose normal.  Eyes:     Extraocular Movements: Extraocular movements intact.     Conjunctiva/sclera: Conjunctivae normal.     Pupils:  Pupils are equal, round, and reactive to light.  Cardiovascular:     Rate and Rhythm: Normal rate and regular rhythm.     Heart sounds: Normal heart sounds.  Pulmonary:     Effort: Pulmonary effort is normal. No respiratory distress.     Breath sounds: Wheezing (Faint, expiratory.  Limited due to habitus.) present.  Musculoskeletal:     Cervical back: Normal range of motion and neck supple.     Right lower leg: Edema present.     Left lower leg: Edema present.  Skin:    General: Skin is warm and dry.  Neurological:     Mental Status: He is alert. Mental status is at baseline.  Psychiatric:        Mood and Affect: Mood normal.        Behavior: Behavior normal.     ED Results / Procedures / Treatments   Labs (all labs ordered are listed, but only abnormal results are displayed) Labs Reviewed  COMPREHENSIVE METABOLIC PANEL - Abnormal; Notable for the following components:      Result Value   Glucose, Bld 102 (*)    BUN 26 (*)    Creatinine, Ser 1.90 (*)    AST 12 (*)    GFR, Estimated 44 (*)    All other components within normal limits  RESP PANEL BY RT-PCR (RSV, FLU A&B, COVID)  [id number]  CBC WITH DIFFERENTIAL/PLATELET  BRAIN NATRIURETIC PEPTIDE  D-DIMER, QUANTITATIVE  TROPONIN I (HIGH SENSITIVITY)    EKG None  Radiology DG Chest 2 View Result Date: 12/19/2023 CLINICAL DATA:  Shortness of breath and intermittent headache. EXAM: CHEST - 2 VIEW COMPARISON:  CT chest and chest x-ray dated January 09, 2023. FINDINGS: The heart size and mediastinal contours are within normal limits. Normal pulmonary vascularity. Minimal streaky atelectasis in the right middle lobe. No focal consolidation, pleural effusion, or pneumothorax. No acute osseous abnormality. IMPRESSION: No active cardiopulmonary disease. Electronically Signed   By: Obie Dredge M.D.   On: 12/19/2023 20:29    Procedures Procedures    Medications Ordered in ED Medications  methylPREDNISolone sodium succinate  (SOLU-MEDROL) 125 mg/2 mL injection 125 mg (125 mg Intravenous Given 12/19/23 2037)  ipratropium-albuterol (DUONEB) 0.5-2.5 (3) MG/3ML nebulizer solution 3 mL (3 mLs Nebulization Given 12/19/23 2039)  albuterol (VENTOLIN HFA) 108 (90 Base) MCG/ACT inhaler 2 puff (2 puffs Inhalation Given 12/19/23 2129)  Aerochamber Plus device 1 each (1 each Other Given 12/19/23 2130)    ED Course/ Medical Decision Making/ A&P                                 Medical  Decision Making Amount and/or Complexity of Data Reviewed Labs: ordered.  Risk Prescription drug management.   Calvin Bailey is a 43 y.o. male with comorbidities that complicate the patient evaluation including CKD, asthma, and tobacco use who presents emergency department shortness of breath.  Since yesterday patient has been having shortness of breath.     Initial Ddx:  URI, pneumonia, CHF, DVT/PE, MI, volume overload, kidney failure  MDM/Course:  Patient presents to the emergency department with shortness of breath since yesterday.  On exam does have some lower extremity edema but no Rales.  Does have some faint wheezing as well but his exam is somewhat limited due to habitus.  Get a chest x-ray that did not show evidence of pneumonia.  BNP WNL.  EKG with some ST changes in the anterior leads but troponin was WNL.  Given the duration of his symptoms and lack of chest pain do not feel that repeat troponin is warranted.  Did have a D-dimer which was WNL as well.  Was given DuoNebs and Solu-Medrol and upon re-evaluation patient remained stable and satting well on room air.  Feel that he may have some wheezing and reactive airway disease so was given steroids, and inhaler, and instructed to follow-up with his primary doctor in several days.  This patient presents to the ED for concern of complaints listed in HPI, this involves an extensive number of treatment options, and is a complaint that carries with it a high risk of complications and  morbidity. Disposition including potential need for admission considered.   Dispo: DC Home. Return precautions discussed including, but not limited to, those listed in the AVS. Allowed pt time to ask questions which were answered fully prior to dc.  Records reviewed Outpatient Clinic Notes The following labs were independently interpreted: Chemistry and show CKD I independently reviewed the following imaging with scope of interpretation limited to determining acute life threatening conditions related to emergency care: Chest x-ray and agree with the radiologist interpretation with the following exceptions: none I personally reviewed and interpreted cardiac monitoring: normal sinus rhythm  I personally reviewed and interpreted the pt's EKG: see above for interpretation  I have reviewed the patients home medications and made adjustments as needed  Portions of this note were generated with Dragon dictation software. Dictation errors may occur despite best attempts at proofreading.     Final Clinical Impression(s) / ED Diagnoses Final diagnoses:  Wheezing  Shortness of breath    Rx / DC Orders ED Discharge Orders          Ordered    predniSONE (DELTASONE) 20 MG tablet  Daily        12/19/23 2118              Rondel Baton, MD 12/19/23 2138

## 2023-12-19 NOTE — ED Notes (Signed)
 Patient provided with inhaler/spacer. Indications for use discussed with patient. Patient able to perform independently.

## 2023-12-20 ENCOUNTER — Ambulatory Visit: Attending: Nurse Practitioner | Admitting: Nurse Practitioner

## 2023-12-20 ENCOUNTER — Encounter: Payer: Self-pay | Admitting: Nurse Practitioner

## 2023-12-20 VITALS — BP 138/78 | HR 86 | Wt 387.7 lb

## 2023-12-20 DIAGNOSIS — R0602 Shortness of breath: Secondary | ICD-10-CM

## 2023-12-20 DIAGNOSIS — N1832 Chronic kidney disease, stage 3b: Secondary | ICD-10-CM | POA: Diagnosis not present

## 2023-12-20 DIAGNOSIS — G4733 Obstructive sleep apnea (adult) (pediatric): Secondary | ICD-10-CM

## 2023-12-20 DIAGNOSIS — E782 Mixed hyperlipidemia: Secondary | ICD-10-CM

## 2023-12-20 DIAGNOSIS — I10 Essential (primary) hypertension: Secondary | ICD-10-CM

## 2023-12-20 DIAGNOSIS — F17200 Nicotine dependence, unspecified, uncomplicated: Secondary | ICD-10-CM

## 2023-12-20 DIAGNOSIS — N183 Chronic kidney disease, stage 3 unspecified: Secondary | ICD-10-CM

## 2023-12-20 NOTE — Progress Notes (Unsigned)
 Office Visit    Patient Name: Calvin Bailey Date of Encounter: 12/20/2023  Primary Care Provider:  de [place], Buren Kos, MD Primary Cardiologist:  Thomasene Ripple, DO  Chief Complaint    43 year old male with a history of hypertension, hyperlipidemia, CKD, OSA, obesity, and tobacco use who presents for follow-up related to shortness of breath.  Past Medical History    Past Medical History:  Diagnosis Date   Arthritis    Cancer (HCC)    right kidney   CHF (congestive heart failure) (HCC)    pt. denies   Chronic kidney disease    Dizziness 09/25/2019   GERD (gastroesophageal reflux disease)    HTN (hypertension)    Obesity hypoventilation syndrome (HCC) 04/08/2014   OSA (obstructive sleep apnea) 04/08/2014   PFO (patent foramen ovale)    Pneumonia    Sleep apnea    wears Cpap   Past Surgical History:  Procedure Laterality Date   RADIOLOGY WITH ANESTHESIA Right 02/16/2023   Procedure: RIGHT RENAL MASS CRYO ABLATION;  Surgeon: Richarda Overlie, MD;  Location: [facility];  Service: Anesthesiology;  Laterality: Right;   two knee surgeries Left 1995    Allergies  Allergies  Allergen Reactions   Hydrochlorothiazide Other (See Comments)   Chlorthalidone Nausea Only   Lisinopril Cough    Coughing up blood     Labs/Other Studies Reviewed    The following studies were reviewed today:  Cardiac Studies & Procedures   ______________________________________________________________________________________________     ECHOCARDIOGRAM  ECHOCARDIOGRAM COMPLETE 11/20/2022  Narrative ECHOCARDIOGRAM REPORT    Patient Name:   Calvin Bailey Date of Exam: 11/20/2022 Medical Rec #:  [id number]        Height:       72.0 in Accession #:    [id number]       Weight:       366.0 lb Date of Birth:  1981-03-21        BSA:          2.754 m Patient Age:    41 years         BP:           132/75 mmHg Patient Gender: M                HR:           62 bpm. Exam Location:   Inpatient  Procedure: 2D Echo and Intracardiac Opacification Agent  Indications:    chest pain  History:        Patient has prior history of Echocardiogram examinations, most recent 02/27/2022. CHF; Risk Factors:Hypertension.  Sonographer:    Cathie Hoops Referring Phys: [id number] [facility] A Izora Ribas   Sonographer Comments: Patient is obese. Image acquisition challenging due to patient body habitus. IMPRESSIONS   1. Left ventricular ejection fraction, by estimation, is 60 to 65%. The left ventricle has normal function. The left ventricle has no regional wall motion abnormalities. There is mild concentric left ventricular hypertrophy. Left ventricular diastolic parameters were normal. 2. Right ventricular systolic function is normal. The right ventricular size is normal. There is normal pulmonary artery systolic pressure. 3. The mitral valve is normal in structure. Trivial mitral valve regurgitation. No evidence of mitral stenosis. 4. The aortic valve is tricuspid. Aortic valve regurgitation is mild. No aortic stenosis is present. 5. The inferior vena cava is normal in size with greater than 50% respiratory variability, suggesting right atrial pressure of 3 mmHg.  Comparison(s): No significant change from prior  study.  FINDINGS Left Ventricle: Left ventricular ejection fraction, by estimation, is 60 to 65%. The left ventricle has normal function. The left ventricle has no regional wall motion abnormalities. Definity contrast agent was given IV to delineate the left ventricular endocardial borders. The left ventricular internal cavity size was normal in size. There is mild concentric left ventricular hypertrophy. Left ventricular diastolic parameters were normal.  Right Ventricle: The right ventricular size is normal. No increase in right ventricular wall thickness. Right ventricular systolic function is normal. There is normal pulmonary artery systolic pressure. The tricuspid regurgitant  velocity is 2.24 m/s, and with an assumed right atrial pressure of 3 mmHg, the estimated right ventricular systolic pressure is 23.1 mmHg.  Left Atrium: Left atrial size was normal in size.  Right Atrium: Right atrial size was normal in size.  Pericardium: There is no evidence of pericardial effusion.  Mitral Valve: The mitral valve is normal in structure. Trivial mitral valve regurgitation. No evidence of mitral valve stenosis.  Tricuspid Valve: The tricuspid valve is normal in structure. Tricuspid valve regurgitation is trivial.  Aortic Valve: The aortic valve is tricuspid. Aortic valve regurgitation is mild. Aortic regurgitation PHT measures 572 msec. No aortic stenosis is present. Aortic valve mean gradient measures 6.0 mmHg. Aortic valve peak gradient measures 10.4 mmHg. Aortic valve area, by VTI measures 2.98 cm.  Pulmonic Valve: The pulmonic valve was normal in structure. Pulmonic valve regurgitation is trivial.  Aorta: The aortic root is normal in size and structure.  Venous: The inferior vena cava is normal in size with greater than 50% respiratory variability, suggesting right atrial pressure of 3 mmHg.  IAS/Shunts: The atrial septum is grossly normal.   LEFT VENTRICLE PLAX 2D LVIDd:         5.90 cm      Diastology LVIDs:         3.60 cm      LV e' medial:    7.94 cm/s LV PW:         1.50 cm      LV E/e' medial:  11.3 LV IVS:        1.50 cm      LV e' lateral:   11.00 cm/s LVOT diam:     2.10 cm      LV E/e' lateral: 8.2 LV SV:         103 LV SV Index:   38 LVOT Area:     3.46 cm  LV Volumes (MOD) LV vol d, MOD A2C: 133.0 ml LV vol d, MOD A4C: 215.0 ml LV vol s, MOD A2C: 58.2 ml LV vol s, MOD A4C: 84.5 ml LV SV MOD A2C:     74.8 ml LV SV MOD A4C:     215.0 ml LV SV MOD BP:      107.0 ml  RIGHT VENTRICLE RV Basal diam:  3.80 cm RV Mid diam:    3.10 cm RV S prime:     15.70 cm/s TAPSE (M-mode): 2.0 cm  LEFT ATRIUM             Index        RIGHT ATRIUM            Index LA diam:        3.90 cm 1.42 cm/m   RA Area:     17.30 cm LA Vol (A2C):   81.5 ml 29.59 ml/m  RA Volume:   43.70 ml  15.87 ml/m LA Vol (A4C):  79.9 ml 29.01 ml/m LA Biplane Vol: 82.4 ml 29.92 ml/m AORTIC VALVE                     PULMONIC VALVE AV Area (Vmax):    2.98 cm      PV Vmax:       1.21 m/s AV Area (Vmean):   2.91 cm      PV Peak grad:  5.9 mmHg AV Area (VTI):     2.98 cm AV Vmax:           161.00 cm/s AV Vmean:          108.000 cm/s AV VTI:            0.347 m AV Peak Grad:      10.4 mmHg AV Mean Grad:      6.0 mmHg LVOT Vmax:         138.50 cm/s LVOT Vmean:        90.700 cm/s LVOT VTI:          0.298 m LVOT/AV VTI ratio: 0.86 AI PHT:            572 msec  AORTA Ao Root diam: 3.60 cm Ao Asc diam:  3.30 cm  MITRAL VALVE               TRICUSPID VALVE MV Area (PHT): 3.53 cm    TR Peak grad:   20.1 mmHg MV Decel Time: 215 msec    TR Vmax:        224.00 cm/s MV E velocity: 89.70 cm/s MV A velocity: 78.70 cm/s  SHUNTS MV E/A ratio:  1.14        Systemic VTI:  0.30 m Systemic Diam: 2.10 cm  Laurance Flatten MD Electronically signed by Laurance Flatten MD Signature Date/Time: 11/20/2022/5:09:49 PM    Final          ______________________________________________________________________________________________     Recent Labs: 03/10/2023: TSH 0.296 12/19/2023: ALT 14; B Natriuretic Peptide 16.6; BUN 26; Creatinine, Ser 1.90; Hemoglobin 14.5; Platelets 256; Potassium 4.2; Sodium 139  Recent Lipid Panel    Component Value Date/Time   CHOL 136 02/27/2022 0756   CHOL 149 05/30/2019 1500   TRIG 74 02/27/2022 0756   HDL 28 (L) 02/27/2022 0756   HDL 34 (L) 05/30/2019 1500   CHOLHDL 4.9 02/27/2022 0756   VLDL 15 02/27/2022 0756   LDLCALC 93 02/27/2022 0756   LDLCALC 93 05/30/2019 1500    History of Present Illness    43 year old male with the above past medical history including hypertension, hyperlipidemia, CKD, OSA, obesity and tobacco  use.   Most recent echocardiogram in 10/2022 showed EF 60 to 65%, normal LV function, no RWMA, mild concentric LVH, normal RV systolic function, mild aortic valve regurgitation, no significant change from prior study.  Lexiscan Myoview in 12/2022 was low risk, negative for ischemia or infarction, EF 55 to 65%. He was last seen in the office on 12/02/2023 and was stable from a cardiac standpoint.  BP was mildly elevated.  He ran out of his isosorbide prior to his visit.  His prescription was refilled, hydralazine was increased to 150 mg twice daily.  He was planning to participate in digital smoking cessation program.  Distally, he was hoping to schedule bariatric surgery for the beginning of May.  He was evaluated in the ED on 12/19/2023 in the setting of shortness of breath.  He was noted to have mild bilateral lower extremity edema.  BNP was normal.  EKG without acute changes, troponin was negative.  D-dimer was negative.  He was treated with nebulizer and Solu-Medrol with improvement in his symptoms.  He was discharged home in stable condition and was advised to follow-up with his PCP.  He presents today for follow-up.  Since his last visit he has done well from a cardiac standpoint.  Since his recent ED visit his symptoms have improved dramatically, his shortness of breath has essentially resolved.  He has stable dependent bilateral lower extremity edema, he denies chest pain, palpitations, PND, orthopnea, weight gain.  Weight is actually down since his last visit.  Overall, he reports feeling well.   Home Medications    Current Outpatient Medications  Medication Sig Dispense Refill   amLODipine (NORVASC) 10 MG tablet TAKE 1 TABLET(10 MG) BY MOUTH DAILY 90 tablet 3   hydrALAZINE (APRESOLINE) 100 MG tablet Take 1 tablet (100 mg total) by mouth 3 (three) times daily. 270 tablet 3   isosorbide mononitrate (IMDUR) 30 MG 24 hr tablet Take 1 tablet (30 mg total) by mouth daily. 90 tablet 3   labetalol  (NORMODYNE) 300 MG tablet TAKE 1 TABLET(300 MG) BY MOUTH TWICE DAILY 180 tablet 1   spironolactone (ALDACTONE) 25 MG tablet TAKE 1 TABLET(25 MG) BY MOUTH DAILY 30 tablet 11   valsartan (DIOVAN) 160 MG tablet TAKE 1 TABLET(160 MG) BY MOUTH TWICE DAILY 180 tablet 3   acetaminophen (TYLENOL) 500 MG tablet Take 1 tablet (500 mg total) by mouth every 6 (six) hours as needed. (Patient not taking: Reported on 12/20/2023) 30 tablet 0   allopurinol (ZYLOPRIM) 100 MG tablet TAKE 1 TABLET(100 MG) BY MOUTH DAILY (Patient not taking: Reported on 12/20/2023) 90 tablet 0   colchicine 0.6 MG tablet Take 1 tablet (0.6 mg total) by mouth 2 (two) times daily. (Patient not taking: Reported on 12/20/2023) 60 tablet 0   gabapentin (NEURONTIN) 100 MG capsule Take 1 capsule (100 mg total) by mouth 3 (three) times daily for 14 days. (Patient not taking: Reported on 02/09/2023) 42 capsule 0   isosorbide mononitrate (IMDUR) 30 MG 24 hr tablet TAKE 1 TABLET(30 MG) BY MOUTH DAILY 90 tablet 0   OVER THE COUNTER MEDICATION Take 15 mLs by mouth every other day. Soursop (Patient not taking: Reported on 12/20/2023)     predniSONE (DELTASONE) 20 MG tablet Take 2 tablets (40 mg total) by mouth daily for 5 days. (Patient not taking: Reported on 12/20/2023) 10 tablet 0   Semaglutide-Weight Management (WEGOVY) 0.25 MG/0.5ML SOAJ Inject 0.25 mg into the skin every 7 (seven) days. (Patient not taking: Reported on 12/20/2023) 2 mL 0   Semaglutide-Weight Management (WEGOVY) 0.5 MG/0.5ML SOAJ Inject 0.5 mg into the skin every 7 (seven) days. (Patient not taking: Reported on 12/20/2023) 2 mL 0   Semaglutide-Weight Management (WEGOVY) 1 MG/0.5ML SOAJ Inject 1 mg into the skin every 7 (seven) days. (Patient not taking: Reported on 12/20/2023) 2 mL 0   vardenafil (LEVITRA) 10 MG tablet Take 10 mg by mouth as needed. (Patient not taking: Reported on 12/20/2023)     No current facility-administered medications for this visit.     Review of Systems    He  denies chest pain, palpitations, dyspnea, pnd, orthopnea, n, v, dizziness, syncope, edema, weight gain, or early satiety. All other systems reviewed and are otherwise negative except as noted above.   Physical Exam    VS:  BP 138/78   Pulse 86   Wt (!) 387 lb  11.2 oz (175.9 kg)   SpO2 93%   BMI 51.15 kg/m   GEN: Well nourished, well developed, in no acute distress. HEENT: normal. Neck: Supple, no JVD, carotid bruits, or masses. Cardiac: RRR, no murmurs, rubs, or gallops. No clubbing, cyanosis, edema.  Radials/DP/PT 2+ and equal bilaterally.  Respiratory:  Respirations regular and unlabored, clear to auscultation bilaterally. GI: Soft, nontender, nondistended, BS + x 4. MS: no deformity or atrophy. Skin: warm and dry, no rash. Neuro:  Strength and sensation are intact. Psych: Normal affect.  Accessory Clinical Findings    ECG personally reviewed by me today -    - no EKG in office today.   Lab Results  Component Value Date   WBC 8.3 12/19/2023   HGB 14.5 12/19/2023   HCT 43.6 12/19/2023   MCV 86.0 12/19/2023   PLT 256 12/19/2023   Lab Results  Component Value Date   CREATININE 1.90 (H) 12/19/2023   BUN 26 (H) 12/19/2023   NA 139 12/19/2023   K 4.2 12/19/2023   CL 106 12/19/2023   CO2 22 12/19/2023   Lab Results  Component Value Date   ALT 14 12/19/2023   AST 12 (L) 12/19/2023   ALKPHOS 73 12/19/2023   BILITOT 0.4 12/19/2023   Lab Results  Component Value Date   CHOL 136 02/27/2022   HDL 28 (L) 02/27/2022   LDLCALC 93 02/27/2022   TRIG 74 02/27/2022   CHOLHDL 4.9 02/27/2022    Lab Results  Component Value Date   HGBA1C 5.5 03/10/2023    Assessment & Plan    1. Shortness of breath: Most recent echocardiogram in 10/2022 showed EF 60 to 65%, normal LV function, no RWMA, mild concentric LVH, normal RV systolic function, mild aortic valve regurgitation, no significant change from prior study.  Lexiscan Myoview in 12/2022 was low risk, negative for ischemia or  infarction, EF 55 to 65%. Recent ED visit in the setting of shortness of breath.  He was noted to have mild bilateral lower extremity edema.  BNP was normal.  EKG without acute changes, troponin was negative.  D-dimer was negative.  He was treated with nebulizers and Solu-Medrol with improvement in his symptoms.  His symptoms have now completely resolved.  No indication for further testing at this time.  2. Hypertension: BP well controlled, improved with resumption of Imdur, increase hydralazine dosing.. Continue current antihypertensive regimen.   3. CKD stage IIIb: Creatinine was stable at 1.90 in 11/2023.  4. OSA: Adherent to CPAP.  5. Obesity: Pending possible bariatric surgery.  6. Tobacco use: Full cessation advised.  7. Disposition: Follow-up per recall with Dr. Servando Salina in 05/2024.      Joylene Grapes, NP 12/20/2023, 3:56 PM

## 2023-12-21 ENCOUNTER — Encounter: Payer: Self-pay | Admitting: Nurse Practitioner

## 2023-12-23 ENCOUNTER — Inpatient Hospital Stay: Admission: RE | Admit: 2023-12-23 | Source: Ambulatory Visit

## 2023-12-26 ENCOUNTER — Other Ambulatory Visit (HOSPITAL_BASED_OUTPATIENT_CLINIC_OR_DEPARTMENT_OTHER): Payer: Self-pay | Admitting: Family

## 2023-12-27 ENCOUNTER — Ambulatory Visit
Admission: RE | Admit: 2023-12-27 | Discharge: 2023-12-27 | Disposition: A | Discharge status: Home / Self Care | Source: Ambulatory Visit | Attending: Diagnostic Radiology | Admitting: Diagnostic Radiology

## 2023-12-27 DIAGNOSIS — C641 Malignant neoplasm of right kidney, except renal pelvis: Secondary | ICD-10-CM

## 2023-12-30 ENCOUNTER — Telehealth: Payer: Self-pay

## 2023-12-30 ENCOUNTER — Ambulatory Visit: Payer: Self-pay

## 2023-12-30 DIAGNOSIS — Z0000 Encounter for general adult medical examination without abnormal findings: Secondary | ICD-10-CM

## 2023-12-30 NOTE — Telephone Encounter (Signed)
 Called patient per scheduled telephonic health coaching appointment. Patient did not answer. Left message for patient to return call to hold session today or reschedule if necessary.    Renaee Munda, MS, ERHD, NBC-HWC  Care Guide [facility] Vascular Care Navigation Telephone: [phone] Email: [email or web address]

## 2023-12-30 NOTE — Telephone Encounter (Signed)
 Follow up call from earlier today to determine if patient wanted to reschedule health coaching appointment. Patient did not answer. Left message for patient to return call.   Renaee Munda, MS, ERHD, NBC-HWC  Care Guide [facility] Heart & Vascular Care Navigation Telephone: [phone] Email: [email or web address]

## 2024-01-11 DIAGNOSIS — R5381 Other malaise: Secondary | ICD-10-CM | POA: Insufficient documentation

## 2024-01-23 ENCOUNTER — Ambulatory Visit (HOSPITAL_BASED_OUTPATIENT_CLINIC_OR_DEPARTMENT_OTHER): Admitting: Family Medicine

## 2024-01-27 ENCOUNTER — Other Ambulatory Visit

## 2024-01-28 ENCOUNTER — Emergency Department (HOSPITAL_BASED_OUTPATIENT_CLINIC_OR_DEPARTMENT_OTHER)
Admission: EM | Admit: 2024-01-28 | Discharge: 2024-01-28 | Disposition: A | Discharge status: Home / Self Care | Attending: Emergency Medicine | Admitting: Emergency Medicine

## 2024-01-28 ENCOUNTER — Other Ambulatory Visit: Payer: Self-pay

## 2024-01-28 ENCOUNTER — Encounter (HOSPITAL_BASED_OUTPATIENT_CLINIC_OR_DEPARTMENT_OTHER): Payer: Self-pay

## 2024-01-28 ENCOUNTER — Emergency Department (HOSPITAL_BASED_OUTPATIENT_CLINIC_OR_DEPARTMENT_OTHER): Admitting: Radiology

## 2024-01-28 DIAGNOSIS — Z79899 Other long term (current) drug therapy: Secondary | ICD-10-CM | POA: Insufficient documentation

## 2024-01-28 DIAGNOSIS — Z85528 Personal history of other malignant neoplasm of kidney: Secondary | ICD-10-CM | POA: Diagnosis not present

## 2024-01-28 DIAGNOSIS — I130 Hypertensive heart and chronic kidney disease with heart failure and stage 1 through stage 4 chronic kidney disease, or unspecified chronic kidney disease: Secondary | ICD-10-CM | POA: Insufficient documentation

## 2024-01-28 DIAGNOSIS — N189 Chronic kidney disease, unspecified: Secondary | ICD-10-CM | POA: Diagnosis not present

## 2024-01-28 DIAGNOSIS — R0600 Dyspnea, unspecified: Secondary | ICD-10-CM | POA: Insufficient documentation

## 2024-01-28 DIAGNOSIS — I509 Heart failure, unspecified: Secondary | ICD-10-CM | POA: Diagnosis not present

## 2024-01-28 DIAGNOSIS — R0602 Shortness of breath: Secondary | ICD-10-CM | POA: Diagnosis present

## 2024-01-28 LAB — CBC WITH DIFFERENTIAL/PLATELET
Abs Immature Granulocytes: 0.06 10*3/uL (ref 0.00–0.07)
Basophils Absolute: 0.1 10*3/uL (ref 0.0–0.1)
Basophils Relative: 1 %
Eosinophils Absolute: 0.3 10*3/uL (ref 0.0–0.5)
Eosinophils Relative: 3 %
HCT: 43.2 % (ref 39.0–52.0)
Hemoglobin: 14.6 g/dL (ref 13.0–17.0)
Immature Granulocytes: 1 %
Lymphocytes Relative: 25 %
Lymphs Abs: 2.3 10*3/uL (ref 0.7–4.0)
MCH: 29.4 pg (ref 26.0–34.0)
MCHC: 33.8 g/dL (ref 30.0–36.0)
MCV: 86.9 fL (ref 80.0–100.0)
Monocytes Absolute: 0.6 10*3/uL (ref 0.1–1.0)
Monocytes Relative: 7 %
Neutro Abs: 5.8 10*3/uL (ref 1.7–7.7)
Neutrophils Relative %: 63 %
Platelets: 243 10*3/uL (ref 150–400)
RBC: 4.97 MIL/uL (ref 4.22–5.81)
RDW: 13.5 % (ref 11.5–15.5)
WBC: 9.1 10*3/uL (ref 4.0–10.5)
nRBC: 0 % (ref 0.0–0.2)

## 2024-01-28 LAB — COMPREHENSIVE METABOLIC PANEL WITH GFR
ALT: 25 U/L (ref 0–44)
AST: 21 U/L (ref 15–41)
Albumin: 4.4 g/dL (ref 3.5–5.0)
Alkaline Phosphatase: 90 U/L (ref 38–126)
Anion gap: 11 (ref 5–15)
BUN: 26 mg/dL — ABNORMAL HIGH (ref 6–20)
CO2: 23 mmol/L (ref 22–32)
Calcium: 9.3 mg/dL (ref 8.9–10.3)
Chloride: 106 mmol/L (ref 98–111)
Creatinine, Ser: 2.03 mg/dL — ABNORMAL HIGH (ref 0.61–1.24)
GFR, Estimated: 41 mL/min — ABNORMAL LOW (ref >60)
Glucose, Bld: 116 mg/dL — ABNORMAL HIGH (ref 70–99)
Potassium: 4.2 mmol/L (ref 3.5–5.1)
Sodium: 141 mmol/L (ref 135–145)
Total Bilirubin: 0.2 mg/dL (ref 0.0–1.2)
Total Protein: 7.1 g/dL (ref 6.5–8.1)

## 2024-01-28 LAB — PRO BRAIN NATRIURETIC PEPTIDE: Pro Brain Natriuretic Peptide: 72.7 pg/mL (ref <300.0)

## 2024-01-28 LAB — D-DIMER, QUANTITATIVE: D-Dimer, Quant: 0.37 ug{FEU}/mL (ref 0.00–0.50)

## 2024-01-28 LAB — TROPONIN T, HIGH SENSITIVITY
Troponin T High Sensitivity: <15 ng/L (ref <19)
Troponin T High Sensitivity: <15 ng/L (ref <19)

## 2024-01-28 MED ORDER — IPRATROPIUM-ALBUTEROL 0.5-2.5 (3) MG/3ML IN SOLN
3.0000 mL | Freq: Once | RESPIRATORY_TRACT | Status: AC
  Administered 2024-01-28: 3 mL via RESPIRATORY_TRACT
  Filled 2024-01-28: qty 3

## 2024-01-28 MED ORDER — PREDNISONE 10 MG PO TABS
40.0000 mg | ORAL_TABLET | Freq: Every day | ORAL | 0 refills | Status: AC
Start: 2024-01-28 — End: 2024-02-01

## 2024-01-28 NOTE — ED Triage Notes (Signed)
 POV, Short of breath for two weeks, pt was off work today and decided to get checked.   Hx of CKD III. Chest pain 7 out 10 described as burning with physical activity.  Aox4

## 2024-01-28 NOTE — ED Provider Notes (Signed)
 [place] EMERGENCY DEPARTMENT AT [facility] Provider Note   CSN: [id number] Arrival date & time: 01/28/24  1906     History {Add pertinent medical, surgical, social history, OB history to HPI:1} Chief Complaint  Patient presents with   Shortness of Breath    Calvin Bailey is a 43 y.o. male.  HPI     42 year old male with a history of hypertension, hyperlipidemia, CKD, OSA, kidney cancer May 2024 cryoablation, obesity, tobacco use   End of Feb had the flu, got back to work and felt short of breath with minimal exertion Yesterday walked half a mile and then chest burns. Some days can walk longer distances and feel totally find, no dyspnea or chest pain.  Thought maybe could be CPAP machine, took amoxicillin  and it didn't help.  Chest burning if go for a long time like a mile but not every time.  No chest pain today, did have pain yesterday with walking  half a mile-more of a  burning  No cough, no fever, no congestion, no sore throat No orthopnea No change in leg swelling/pain. A few weeks ago was worse then improved.    2-3 weeks ago worsening symptoms.  Does something small then feels out of breath. Came in to ED 3/24 . Saw Cardiologist and they did not feel it was the heart at that time.  12/2022 had stress test was low risk.   Does feel that breathing treatments helped in March. No known hx of COPD or asthma but does smoke   Past Medical History:  Diagnosis Date   Arthritis    Cancer (HCC)    right kidney   CHF (congestive heart failure) (HCC)    pt. denies   Chronic kidney disease    Dizziness 09/25/2019   GERD (gastroesophageal reflux disease)    HTN (hypertension)    Obesity hypoventilation syndrome (HCC) 04/08/2014   OSA (obstructive sleep apnea) 04/08/2014   PFO (patent foramen ovale)    Pneumonia    Sleep apnea    wears Cpap     Home Medications Prior to Admission medications   Medication Sig Start Date End Date Taking? Authorizing  Provider  acetaminophen  (TYLENOL ) 500 MG tablet Take 1 tablet (500 mg total) by mouth every 6 (six) hours as needed. Patient not taking: Reported on 12/20/2023 05/20/23   Rory Collard, MD  allopurinol  (ZYLOPRIM ) 100 MG tablet TAKE 1 TABLET(100 MG) BY MOUTH DAILY Patient not taking: Reported on 12/20/2023 03/10/23   [place], Alonza Jansky, MD  amLODipine  (NORVASC ) 10 MG tablet TAKE 1 TABLET(10 MG) BY MOUTH DAILY 12/23/22   Tobb, Kardie, DO  colchicine  0.6 MG tablet Take 1 tablet (0.6 mg total) by mouth 2 (two) times daily. Patient not taking: Reported on 12/20/2023 03/10/23   [place], Alonza Jansky, MD  gabapentin  (NEURONTIN ) 100 MG capsule Take 1 capsule (100 mg total) by mouth 3 (three) times daily for 14 days. Patient not taking: Reported on 02/09/2023 01/15/23 01/29/23  Long, Shereen Dike, MD  hydrALAZINE  (APRESOLINE ) 100 MG tablet Take 1 tablet (100 mg total) by mouth 3 (three) times daily. 12/02/23   Tobb, Kardie, DO  isosorbide  mononitrate (IMDUR ) 30 MG 24 hr tablet TAKE 1 TABLET(30 MG) BY MOUTH DAILY 06/10/23   Tobb, Kardie, DO  isosorbide  mononitrate (IMDUR ) 30 MG 24 hr tablet Take 1 tablet (30 mg total) by mouth daily. 12/02/23   Tobb, Kardie, DO  labetalol  (NORMODYNE ) 300 MG tablet TAKE 1 TABLET(300 MG) BY MOUTH  TWICE DAILY 09/12/23   Tobb, Kardie, DO  OVER THE COUNTER MEDICATION Take 15 mLs by mouth every other day. Soursop Patient not taking: Reported on 12/20/2023    [provider]  Semaglutide -Weight Management (WEGOVY ) 0.25 MG/0.5ML SOAJ Inject 0.25 mg into the skin every 7 (seven) days. Patient not taking: Reported on 12/20/2023 04/04/23   Tobb, Kardie, DO  Semaglutide -Weight Management (WEGOVY ) 0.5 MG/0.5ML SOAJ Inject 0.5 mg into the skin every 7 (seven) days. Patient not taking: Reported on 12/20/2023 04/04/23   Tobb, Kardie, DO  Semaglutide -Weight Management (WEGOVY ) 1 MG/0.5ML SOAJ Inject 1 mg into the skin every 7 (seven) days. Patient not taking: Reported on 12/20/2023 08/17/23   Tobb,  Kardie, DO  spironolactone  (ALDACTONE ) 25 MG tablet TAKE 1 TABLET(25 MG) BY MOUTH DAILY 06/24/23   Tobb, Kardie, DO  valsartan  (DIOVAN ) 160 MG tablet TAKE 1 TABLET(160 MG) BY MOUTH TWICE DAILY 07/26/23   Tobb, Kardie, DO  vardenafil  (LEVITRA ) 10 MG tablet Take 10 mg by mouth as needed. Patient not taking: Reported on 12/20/2023 04/07/17   [provider]  LISINOPRIL -HYDROCHLOROTHIAZIDE  PO Take by mouth.  07/06/19  [provider]      Allergies    Hydrochlorothiazide , Chlorthalidone , and Lisinopril     Review of Systems   Review of Systems  Physical Exam Updated Vital Signs BP (!) 146/86 (BP Location: Right Arm)   Pulse 65   Temp 97.9 F (36.6 C)   Resp 20   Ht 6\' 1"  (1.854 m)   Wt (!) 172.4 kg   SpO2 99%   BMI 50.13 kg/m  Physical Exam  ED Results / Procedures / Treatments   Labs (all labs ordered are listed, but only abnormal results are displayed) Labs Reviewed  COMPREHENSIVE METABOLIC PANEL WITH GFR - Abnormal; Notable for the following components:      Result Value   Glucose, Bld 116 (*)    BUN 26 (*)    Creatinine, Ser 2.03 (*)    GFR, Estimated 41 (*)    All other components within normal limits  CBC WITH DIFFERENTIAL/PLATELET  PRO BRAIN NATRIURETIC PEPTIDE  TROPONIN T, HIGH SENSITIVITY  TROPONIN T, HIGH SENSITIVITY    EKG EKG Interpretation Date/Time:  Saturday Jan 28 2024 19:18:47 EDT Ventricular Rate:  66 PR Interval:  182 QRS Duration:  96 QT Interval:  398 QTC Calculation: 417 R Axis:   46  Text Interpretation: Normal sinus rhythm Normal ECG When compared with ECG of 19-Dec-2023 20:34, PREVIOUS ECG IS PRESENT Will repeat, STE in V2, baseline wander Confirmed by Scarlette Currier ([id number]) on 01/28/2024 7:21:42 PM  Radiology DG Chest 2 View Result Date: 01/28/2024 CLINICAL DATA:  Shortness of breath for 2 weeks EXAM: CHEST - 2 VIEW COMPARISON:  12/19/2023 FINDINGS: Stable cardiomediastinal silhouette. No focal consolidation, pleural  effusion, or pneumothorax. No displaced rib fractures. IMPRESSION: No active cardiopulmonary disease. Electronically Signed   By: Rozell Cornet M.D.   On: 01/28/2024 19:51    Procedures Procedures  {Document cardiac monitor, telemetry assessment procedure when appropriate:1}  Medications Ordered in ED Medications - No data to display  ED Course/ Medical Decision Making/ A&P   {   Click here for ABCD2, HEART and other calculatorsREFRESH Note before signing :1}                               *** Chest x-ray completed to evaluate interpreted by me shows no signs of pneumonia, pneumothorax,  pulmonary edema.  Labs completed and personally evaluated interpreted by me show a normal BNP, normal troponin, creatinine mildly elevated from previous from 1.9-2.03.  No anemia or leukocytosis.   {Document critical care time when appropriate:1} {Document review of labs and clinical decision tools ie heart score, Chads2Vasc2 etc:1}  {Document your independent review of radiology images, and any outside records:1} {Document your discussion with family members, caretakers, and with consultants:1} {Document social determinants of health affecting pt's care:1} {Document your decision making why or why not admission, treatments were needed:1} Final Clinical Impression(s) / ED Diagnoses Final diagnoses:  None    Rx / DC Orders ED Discharge Orders     None

## 2024-01-30 ENCOUNTER — Telehealth: Payer: Self-pay | Admitting: Cardiology

## 2024-01-30 NOTE — Telephone Encounter (Signed)
  Pt c/o Shortness Of Breath: STAT if SOB developed within the last 24 hours or pt is noticeably SOB on the phone  1. Are you currently SOB (can you hear that pt is SOB on the phone)? No   2. How long have you been experiencing SOB? 2 weeks   3. Are you SOB when sitting or when up moving around? Both   4. Are you currently experiencing any other symptoms? Dizziness ,BP is normal 135/70   Pt said, he is experiencing SOB and its getting worst

## 2024-01-30 NOTE — Telephone Encounter (Signed)
 Called and spoke to pt.  He is c/o SOB that has become worse over the last two weeks. He also has some dizziness and abdominal bloating/pain.  He was in the [facility] ER on Saturday, CXR was negative.  Pt states he feels like it is related to being fluid overloaded and is asking for lasix .  States he took this medication back in January and it helped.

## 2024-01-31 NOTE — Telephone Encounter (Signed)
 Spoke with patient and he is scheduled with APP. ED precautions discussed

## 2024-02-01 ENCOUNTER — Encounter: Payer: Self-pay | Admitting: Internal Medicine

## 2024-02-01 ENCOUNTER — Ambulatory Visit
Admission: RE | Admit: 2024-02-01 | Discharge: 2024-02-01 | Disposition: A | Discharge status: Home / Self Care | Source: Ambulatory Visit | Attending: Diagnostic Radiology | Admitting: Diagnostic Radiology

## 2024-02-01 ENCOUNTER — Ambulatory Visit: Admitting: Internal Medicine

## 2024-02-01 VITALS — BP 128/78 | HR 76 | Temp 98.7°F | Ht 73.0 in | Wt 382.4 lb

## 2024-02-01 DIAGNOSIS — C642 Malignant neoplasm of left kidney, except renal pelvis: Secondary | ICD-10-CM | POA: Diagnosis not present

## 2024-02-01 DIAGNOSIS — R0602 Shortness of breath: Secondary | ICD-10-CM | POA: Diagnosis not present

## 2024-02-01 DIAGNOSIS — G4733 Obstructive sleep apnea (adult) (pediatric): Secondary | ICD-10-CM | POA: Diagnosis not present

## 2024-02-01 DIAGNOSIS — F17210 Nicotine dependence, cigarettes, uncomplicated: Secondary | ICD-10-CM | POA: Diagnosis not present

## 2024-02-01 LAB — NITRIC OXIDE: Nitric Oxide: 31

## 2024-02-01 MED ORDER — IOPAMIDOL (ISOVUE-300) INJECTION 61%
100.0000 mL | Freq: Once | INTRAVENOUS | Status: AC | PRN
  Administered 2024-02-01: 100 mL via INTRAVENOUS

## 2024-02-01 NOTE — Patient Instructions (Signed)
 Recommend obtaining pulmonary function testing to assess lung function Recommend obtaining CT of the chest with a history of kidney cancer to make sure there is no disease in the lungs Follow-up with cardiology Follow-up with nephrology Follow-up with Dr. Julietta Ogren interventional radiology for kidney cancer We will help with your sleep apnea machine and sleep apnea Follow-up with bariatric surgery evaluation  Avoid Allergens and Irritants Avoid secondhand smoke Avoid SICK contacts Recommend  Masking  when appropriate Recommend Keep up-to-date with vaccinations

## 2024-02-01 NOTE — Progress Notes (Signed)
 [facility] Pulmonary Medicine Consultation      Date: 02/01/2024,   MRN# [id number] Calvin Bailey Nov 17, 1980   CHIEF COMPLAINT:   Assessment of shortness of breath   HISTORY OF PRESENT ILLNESS   43 year old morbidly obese African-American male seen today for assessment of shortness of breath  January 2025 diagnosed with influenza A Patient had urgent care visit was given prednisone  and antibiotics  February 2025 admitted for shortness of breath has CKD stage III found to have hyperkalemia-electrolytes managed properly patient symptoms resolved  May 2025-admitted for shortness of breath in the ER patient self medicated himself with Lasix  and symptoms improved  Patient also has a diagnosis of kidney cancer which is being taken care of by interventional radiology undergoing cryotherapy  Patient smokes 1 pack a day for the last 10 to 12 years Currently smokes  Patient being evaluated for bariatric surgery currently weighs 382 pounds  No exacerbation at this time No evidence of heart failure at this time No evidence or signs of infection at this time No respiratory distress No fevers, chills, nausea, vomiting, diarrhea No evidence of lower extremity edema No evidence hemoptysis    Chest x-ray Jan 28, 2024 independently reviewed by me today No significant findings No pneumonia No effusions No edema   PAST MEDICAL HISTORY   Past Medical History:  Diagnosis Date   Arthritis    Cancer (HCC)    right kidney   CHF (congestive heart failure) (HCC)    pt. denies   Chronic kidney disease    Dizziness 09/25/2019   GERD (gastroesophageal reflux disease)    HTN (hypertension)    Obesity hypoventilation syndrome (HCC) 04/08/2014   OSA (obstructive sleep apnea) 04/08/2014   PFO (patent foramen ovale)    Pneumonia    Sleep apnea    wears Cpap     SURGICAL HISTORY   Past Surgical History:  Procedure Laterality Date   RADIOLOGY WITH ANESTHESIA Right 02/16/2023    Procedure: RIGHT RENAL MASS CRYO ABLATION;  Surgeon: Elene Griffes, MD;  Location: [facility];  Service: Anesthesiology;  Laterality: Right;   two knee surgeries Left 1995     FAMILY HISTORY   Family History  Problem Relation Age of Onset   Hypertension Mother    Multiple sclerosis Mother    Hypertension Father    Hypertension Brother    Hyperlipidemia Paternal Grandfather    Heart failure Paternal Grandfather    Diabetes Maternal Grandmother    Heart failure Maternal Grandfather    CVA Maternal Uncle      SOCIAL HISTORY   Social History   Tobacco Use   Smoking status: Every Day    Current packs/day: 0.00    Average packs/day: 0.1 packs/day for 16.0 years (1.6 ttl pk-yrs)    Types: Cigarettes    Start date: 06/08/2006    Last attempt to quit: 06/08/2022    Years since quitting: 1.6   Smokeless tobacco: Never   Tobacco comments:    12/20/2023 patient smokes 3 cigarettes daily  Vaping Use   Vaping status: Never Used  Substance Use Topics   Alcohol use: Not Currently    Comment: sparingly    Drug use: No     MEDICATIONS    Home Medication:  Current Outpatient Rx   Order #: [id number] Class: Print   Order #: [id number] Class: Normal   Order #: [id number] Class: Normal   Order #: [id number] Class: Normal   Order #: [id number] Class: Normal   Order #: [id number] Class: Normal  Order #: [id number] Class: Normal   Order #: [id number] Class: Normal   Order #: [id number] Class: Normal   Order #: [id number] Class: Historical Med   Order #: [id number] Class: Normal   Order #: [id number] Class: Sample   Order #: [id number] Class: Normal   Order #: [id number] Class: Normal   Order #: [id number] Class: Normal   Order #: [id number] Class: Normal   Order #: [id number] Class: Historical Med    Current Medication:  Current Outpatient Medications:    acetaminophen  (TYLENOL ) 500 MG tablet, Take 1 tablet (500 mg total) by mouth every 6 (six) hours as needed. (Patient not taking: Reported on 12/20/2023), Disp:  30 tablet, Rfl: 0   allopurinol  (ZYLOPRIM ) 100 MG tablet, TAKE 1 TABLET(100 MG) BY MOUTH DAILY (Patient not taking: Reported on 12/20/2023), Disp: 90 tablet, Rfl: 0   amLODipine  (NORVASC ) 10 MG tablet, TAKE 1 TABLET(10 MG) BY MOUTH DAILY, Disp: 90 tablet, Rfl: 3   colchicine  0.6 MG tablet, Take 1 tablet (0.6 mg total) by mouth 2 (two) times daily. (Patient not taking: Reported on 12/20/2023), Disp: 60 tablet, Rfl: 0   gabapentin  (NEURONTIN ) 100 MG capsule, Take 1 capsule (100 mg total) by mouth 3 (three) times daily for 14 days. (Patient not taking: Reported on 02/09/2023), Disp: 42 capsule, Rfl: 0   hydrALAZINE  (APRESOLINE ) 100 MG tablet, Take 1 tablet (100 mg total) by mouth 3 (three) times daily., Disp: 270 tablet, Rfl: 3   isosorbide  mononitrate (IMDUR ) 30 MG 24 hr tablet, TAKE 1 TABLET(30 MG) BY MOUTH DAILY, Disp: 90 tablet, Rfl: 0   isosorbide  mononitrate (IMDUR ) 30 MG 24 hr tablet, Take 1 tablet (30 mg total) by mouth daily., Disp: 90 tablet, Rfl: 3   labetalol  (NORMODYNE ) 300 MG tablet, TAKE 1 TABLET(300 MG) BY MOUTH TWICE DAILY, Disp: 180 tablet, Rfl: 1   OVER THE COUNTER MEDICATION, Take 15 mLs by mouth every other day. Soursop (Patient not taking: Reported on 12/20/2023), Disp: , Rfl:    predniSONE  (DELTASONE ) 10 MG tablet, Take 4 tablets (40 mg total) by mouth daily for 4 days., Disp: 16 tablet, Rfl: 0   Semaglutide -Weight Management (WEGOVY ) 0.25 MG/0.5ML SOAJ, Inject 0.25 mg into the skin every 7 (seven) days. (Patient not taking: Reported on 12/20/2023), Disp: 2 mL, Rfl: 0   Semaglutide -Weight Management (WEGOVY ) 0.5 MG/0.5ML SOAJ, Inject 0.5 mg into the skin every 7 (seven) days. (Patient not taking: Reported on 12/20/2023), Disp: 2 mL, Rfl: 0   Semaglutide -Weight Management (WEGOVY ) 1 MG/0.5ML SOAJ, Inject 1 mg into the skin every 7 (seven) days. (Patient not taking: Reported on 12/20/2023), Disp: 2 mL, Rfl: 0   spironolactone  (ALDACTONE ) 25 MG tablet, TAKE 1 TABLET(25 MG) BY MOUTH DAILY,  Disp: 30 tablet, Rfl: 11   valsartan  (DIOVAN ) 160 MG tablet, TAKE 1 TABLET(160 MG) BY MOUTH TWICE DAILY, Disp: 180 tablet, Rfl: 3   vardenafil  (LEVITRA ) 10 MG tablet, Take 10 mg by mouth as needed. (Patient not taking: Reported on 12/20/2023), Disp: , Rfl:     ALLERGIES   Hydrochlorothiazide , Chlorthalidone , and Lisinopril   BP 128/78 (BP Location: Right Arm, Patient Position: Sitting, Cuff Size: Large)   Pulse 76   Temp 98.7 F (37.1 C) (Oral)   Ht 6\' 1"  (1.854 m)   Wt (!) 382 lb 6.4 oz (173.5 kg)   SpO2 96%   BMI 50.45 kg/m    Review of Systems: Gen:  Denies  fever, sweats, chills weight loss  HEENT: Denies blurred vision, double vision, ear pain, eye pain, hearing loss, nose bleeds, sore throat Cardiac:  No dizziness, chest pain or heaviness, chest tightness,edema, No JVD Resp:   No cough, -sputum production, +shortness of breath,-wheezing, -hemoptysis,  Other:  All other systems negative   Physical Examination:   General Appearance: No distress  EYES PERRLA, EOM intact.   NECK Supple, No JVD Pulmonary: normal breath sounds, No wheezing.  CardiovascularNormal S1,S2.  No m/r/g.   Abdomen: Benign, Soft, non-tender. Neurology UE/LE 5/5 strength, no focal deficits Ext pulses intact, cap refill intact +edema ALL OTHER ROS ARE NEGATIVE      IMAGING    DG Chest 2 View Result Date: 01/28/2024 CLINICAL DATA:  Shortness of breath for 2 weeks EXAM: CHEST - 2 VIEW COMPARISON:  12/19/2023 FINDINGS: Stable cardiomediastinal silhouette. No focal consolidation, pleural effusion, or pneumothorax. No displaced rib fractures. IMPRESSION: No active cardiopulmonary disease. Electronically Signed   By: Rozell Cornet M.D.   On: 01/28/2024 19:51      ASSESSMENT/PLAN   43 year old pleasant African-American male seen today for ongoing shortness of breath likely related to underlying reactive airways disease and COPD due to extensive smoking history in the setting of morbid obesity and  deconditioned state with underlying signs and symptoms of chronic kidney disease stage III with diastolic dysfunction in the setting of diagnosis of kidney carcinoma, underlying diagnosis of OSA  Assessment of shortness of breath Recommend pulmonary function test Findings are highly suggestive of COPD FeNO measured 31 suggesting underlying reactive airways disease and type II inflammation At this time patient does not have any symptoms Will not prescribe any maintenance therapy at this time Continue to use albuterol  as needed  Smoking Assessment and Cessation Counseling Upon further questioning, Patient smokes 1 ppd I have advised patient to quit/stop smoking as soon as possible due to high risk for multiple medical problems Patient is willing to quit smoking  I have advised patient that we can assist and have options of Nicotine  replacement therapy. I also advised patient on behavioral therapy and can provide oral medication therapy in conjunction with the other therapies Follow up next Office visit  for assessment of smoking cessation Smoking cessation counseling advised for 4 minutes   Cardiac dysfunction Follow-up cardiology Lasix  as prescribed  Kidney carcinoma Follow-up interventional radiology cryosurgery ablation therapy performed Recommend CT chest to assess for metastatic disease  Follow-up bariatric surgery Follow-up evaluation  History of OSA Obtain CPAP download   MEDICATION ADJUSTMENTS/LABS AND TESTS ORDERED: Recommend obtaining pulmonary function testing to assess lung function Recommend obtaining CT of the chest with a history of kidney cancer to make sure there is no disease in the lungs Can use albuterol  as needed Follow-up with cardiology Follow-up with nephrology Follow-up with Dr. Julietta Ogren interventional radiology for kidney cancer We will help with your sleep apnea machine and sleep apnea Follow-up with bariatric surgery evaluation Avoid Allergens and  Irritants Avoid secondhand smoke Avoid SICK contacts Recommend  Masking  when appropriate Recommend Keep up-to-date with vaccinations   CURRENT MEDICATIONS REVIEWED AT LENGTH WITH PATIENT TODAY   Patient  satisfied with Plan of action and management. All questions answered  Follow up 4 weeks  I spent a total of 67 minutes reviewing chart data, face-to-face evaluation with the patient, counseling and coordination of care as detailed above.     Lady Pier, M.D.  [facility]  Medical Director [facility] Medical Director [facility] Cardio-Pulmonary Department

## 2024-02-02 ENCOUNTER — Encounter (HOSPITAL_BASED_OUTPATIENT_CLINIC_OR_DEPARTMENT_OTHER): Payer: Self-pay | Admitting: Family Medicine

## 2024-02-02 ENCOUNTER — Encounter: Payer: Self-pay | Admitting: Cardiology

## 2024-02-02 ENCOUNTER — Ambulatory Visit (HOSPITAL_BASED_OUTPATIENT_CLINIC_OR_DEPARTMENT_OTHER): Admitting: Family Medicine

## 2024-02-02 VITALS — BP 139/75 | HR 68 | Ht 73.0 in | Wt 385.5 lb

## 2024-02-02 DIAGNOSIS — R7989 Other specified abnormal findings of blood chemistry: Secondary | ICD-10-CM

## 2024-02-02 DIAGNOSIS — N1832 Chronic kidney disease, stage 3b: Secondary | ICD-10-CM | POA: Diagnosis not present

## 2024-02-02 DIAGNOSIS — R0602 Shortness of breath: Secondary | ICD-10-CM

## 2024-02-02 MED ORDER — FUROSEMIDE 20 MG PO TABS: 20.0000 mg | ORAL_TABLET | Freq: Every day | ORAL | 0 refills | Status: DC | PRN

## 2024-02-02 NOTE — Assessment & Plan Note (Signed)
 Patient reports that he has been having ongoing shortness of breath.  This has been an intermittent problem for him and he more recently has had issues over the last 1 to 2 weeks.  He did have recent evaluation with pulmonologist and does have upcoming appointment with his cardiologist.  He did have Lasix  prescribed in the past and still had some this available and has been taking this intermittently as well and has found relief when taking this medication.  Plan with pulmonology evaluation was to proceed with PFTs as well as CT scan of the chest.  He notes shortness of breath with activity and also with lying down at times. On exam, patient is in no acute distress, vital signs stable, normal oxygenation on room air.  Cardiovascular Sam with regular rate and rhythm.  Lungs clear to auscultation bilaterally. Reviewed prior records from pulmonology as well as ER visits.  Reviewed prior labs and imaging.  Chest x-ray normal recently.  BMP during emergency department visit also normal.  On labs, there has been gradual increase in creatinine over time.  Over the past year, this has increased from about 1.59 up to 2.03. Discussed concerns related to diuretic use and how this could negatively impact the kidneys.  Given this, we will need to be judicious in use of medication.  Discussed that labs and imaging recently would indicate that there was not a severe degree of volume overload.  Certainly his symptomatic improvement with use of Lasix  suggest some possible degree of excess fluid.  I do feel that patient would benefit from having testing as recommended by pulmonology.  Also recommend further evaluation with cardiology.  In the meantime, can utilize Lasix  if noticing shortness of breath as well as weight gain.  We will proceed with low-dose of medication in order to balance potential negative effects that this could have on kidneys and again cautioned patient on this today.

## 2024-02-02 NOTE — Progress Notes (Signed)
    Procedures performed today:    None.  Independent interpretation of notes and tests performed by another provider:   None.  Brief History, Exam, Impression, and Recommendations:    BP 139/75 (BP Location: Right Arm, Patient Position: Sitting, Cuff Size: Large)   Pulse 68   Ht 6\' 1"  (1.854 m)   Wt (!) 385 lb 8 oz (174.9 kg)   SpO2 98%   BMI 50.86 kg/m   Stage 3b chronic kidney disease (HCC) Assessment & Plan: Has had gradual increase in serum creatinine and corresponding decrease in EGFR.  He indicates that he continues to follow with his nephrologist.  As discussed separately, will need to be mindful of this in relation to management of other condition, particularly with presenting complaint of shortness of breath.  Recommend continue close follow-up with nephrologist.   Shortness of breath Assessment & Plan: Patient reports that he has been having ongoing shortness of breath.  This has been an intermittent problem for him and he more recently has had issues over the last 1 to 2 weeks.  He did have recent evaluation with pulmonologist and does have upcoming appointment with his cardiologist.  He did have Lasix  prescribed in the past and still had some this available and has been taking this intermittently as well and has found relief when taking this medication.  Plan with pulmonology evaluation was to proceed with PFTs as well as CT scan of the chest.  He notes shortness of breath with activity and also with lying down at times. On exam, patient is in no acute distress, vital signs stable, normal oxygenation on room air.  Cardiovascular Sam with regular rate and rhythm.  Lungs clear to auscultation bilaterally. Reviewed prior records from pulmonology as well as ER visits.  Reviewed prior labs and imaging.  Chest x-ray normal recently.  BMP during emergency department visit also normal.  On labs, there has been gradual increase in creatinine over time.  Over the past year, this has  increased from about 1.59 up to 2.03. Discussed concerns related to diuretic use and how this could negatively impact the kidneys.  Given this, we will need to be judicious in use of medication.  Discussed that labs and imaging recently would indicate that there was not a severe degree of volume overload.  Certainly his symptomatic improvement with use of Lasix  suggest some possible degree of excess fluid.  I do feel that patient would benefit from having testing as recommended by pulmonology.  Also recommend further evaluation with cardiology.  In the meantime, can utilize Lasix  if noticing shortness of breath as well as weight gain.  We will proceed with low-dose of medication in order to balance potential negative effects that this could have on kidneys and again cautioned patient on this today.   Other orders -     Furosemide ; Take 1 tablet (20 mg total) by mouth daily as needed.  Dispense: 30 tablet; Refill: 0  Return in about 2 months (around 04/03/2024).  Spent 36 minutes on this patient encounter, including preparation, chart review, face-to-face counseling with patient and coordination of care, and documentation of encounter   ___________________________________________ Jameshia Hayashida de [place], MD, ABFM, [facility] Primary Care and Sports Medicine [facility]

## 2024-02-02 NOTE — Assessment & Plan Note (Signed)
 Has had gradual increase in serum creatinine and corresponding decrease in EGFR.  He indicates that he continues to follow with his nephrologist.  As discussed separately, will need to be mindful of this in relation to management of other condition, particularly with presenting complaint of shortness of breath.  Recommend continue close follow-up with nephrologist.

## 2024-02-02 NOTE — Patient Instructions (Signed)
   Medication Instructions:  Your physician recommends that you continue on your current medications as directed. Please refer to the Current Medication list given to you today. --If you need a refill on any your medications before your next appointment, please call your pharmacy first. If no refills are authorized on file call the office.--   Follow-Up: Your next appointment:   Your physician recommends that you schedule a follow-up appointment in: 2-3 month follow up  with Dr. de [place]  You will receive a text message or e-mail with a link to a survey about your care and experience with [place] today! We would greatly appreciate your feedback!   Thanks for letting us be apart of your health journey!!  Primary Care and Sports Medicine   Dr. Ceasar Mons [place]   We encourage you to activate your patient portal called "MyChart".  Sign up information is provided on this After Visit Summary.  MyChart is used to connect with patients for Virtual Visits (Telemedicine).  Patients are able to view lab/test results, encounter notes, upcoming appointments, etc.  Non-urgent messages can be sent to your provider as well. To learn more about what you can do with MyChart, please visit --  [email or web address].

## 2024-02-06 LAB — PROTEIN / CREATININE RATIO, URINE
Albumin, U: 16.3
Creatinine, Urine: 139.7

## 2024-02-06 LAB — MICROALBUMIN / CREATININE URINE RATIO
Albumin, Urine POC: 16.3
Creatinine, POC: 139.7 mg/dL
Microalb Creat Ratio: 12

## 2024-02-08 ENCOUNTER — Other Ambulatory Visit (HOSPITAL_COMMUNITY): Payer: Self-pay | Admitting: Family Medicine

## 2024-02-08 ENCOUNTER — Other Ambulatory Visit (HOSPITAL_BASED_OUTPATIENT_CLINIC_OR_DEPARTMENT_OTHER): Payer: Self-pay | Admitting: Family Medicine

## 2024-02-09 LAB — T3: T3, Total: 125 ng/dL (ref 71–180)

## 2024-02-09 LAB — TSH+FREE T4
Free T4: 0.9 ng/dL (ref 0.82–1.77)
TSH: 0.972 u[IU]/mL (ref 0.450–4.500)

## 2024-02-10 ENCOUNTER — Ambulatory Visit (HOSPITAL_BASED_OUTPATIENT_CLINIC_OR_DEPARTMENT_OTHER): Payer: Self-pay | Admitting: Family Medicine

## 2024-02-15 NOTE — Progress Notes (Deleted)
 Cardiology Clinic Note   Patient Name: Calvin Bailey Date of Encounter: 02/15/2024  Primary Care Provider:  [place], Alonza Jansky, MD Primary Cardiologist:  Kardie Tobb, DO  Patient Profile    Calvin Bailey 43 year old male presents to the clinic  today for follow-up evaluation of his chronic diastolic CHF and essential hypertension.   Past Medical History    Past Medical History:  Diagnosis Date   Arthritis    Cancer (HCC)    right kidney   CHF (congestive heart failure) (HCC)    pt. denies   Chronic kidney disease    Dizziness 09/25/2019   GERD (gastroesophageal reflux disease)    HTN (hypertension)    Obesity hypoventilation syndrome (HCC) 04/08/2014   OSA (obstructive sleep apnea) 04/08/2014   PFO (patent foramen ovale)    Pneumonia    Sleep apnea    wears Cpap   Past Surgical History:  Procedure Laterality Date   RADIOLOGY WITH ANESTHESIA Right 02/16/2023   Procedure: RIGHT RENAL MASS CRYO ABLATION;  Surgeon: Elene Griffes, MD;  Location: [facility];  Service: Anesthesiology;  Laterality: Right;   two knee surgeries Left 1995    Allergies  Allergies  Allergen Reactions   Hydrochlorothiazide  Other (See Comments)   Chlorthalidone  Nausea Only   Lisinopril  Cough    Coughing up blood    History of Present Illness    Calvin Bailey has a PMH of essential hypertension, ED, LVH, chronic diastolic CHF, OSA, hypogonadism, CKD stage III, dizziness, chronic cough, morbid obesity, tobacco use, and renal mass.  Echocardiogram 2/24 showed an LVEF of 60 to 65% and mild concentric LVH with normal RV function, mild aortic valve regurgitation and no significant changes from his previous study.  Nuclear stress testing 4/24 showed low risk and was negative for ischemia.   He was seen in follow-up by Dr. Emmette Harms on 06/10/2023.  During that time he reported that he was doing okay.  He had had multiple hospital visits.  He did note that during one of his hospital visits he was  experiencing chest discomfort and he was started on Imdur .  He noted that the Imdur  had helped him significantly.  His blood pressure was well-controlled.  However, he had run out of the medication and did not have refills.  His Imdur  was restarted.  He was continued on hydralazine , labetalol , amlodipine , valsartan , and spironolactone .  Follow-up was planned for 2 months.  He was seen in follow-up by Marlana Silvan NP on 12/20/2023.  He was evaluated for shortness of breath.  He was seen and evaluated in the emergency department on 11/29/2023.  He noted shortness of breath.  He was noted to have mild bilateral lower extremity swelling.  His BNP was normal.  His EKG showed no acute changes.  His cardiac troponins were negative.  His D-dimer was negative.  He was treated with Solu-Medrol  and nebulizer.  He noted improvement in his symptoms.  He was discharged home in stable condition and instructed to follow-up with his PCP.  On cardiology follow-up he was doing well from a cardiac standpoint.  His shortness of breath had resolved.  He had stable dependent bilateral lower extremity edema.  He denied chest pain palpitations orthopnea and PND.  His weight was stable.  His weight was down from his previous visit.  Follow-up was planned for 9/25 with Dr. Emmette Harms  He contacted nurse triage line on 01/30/2024.  He noted increased work of breathing x 2 weeks.  He noted this with rest and with physical activity.  He reported dizziness and noted that his blood pressure was 135/70.  He felt his shortness of breath was getting worse.    He presents to the clinic today for follow-up evaluation and states***.   *** denies chest pain, shortness of breath, lower extremity edema, fatigue, palpitations, melena, hematuria, hemoptysis, diaphoresis, weakness, presyncope, syncope, orthopnea, and PND.   DOE-echocardiogram 2/24 showed normal LVEF, mild concentric LVH and normal RV function.  Nuclear stress testing 4/24 showed low risk and no  ischemia.  Previously seen in the emergency department 3/25.  He received Solu-Medrol  and nebulizer treatments which improved his symptoms.  Lab work 01/28/2024 showed creatinine of 2.03, stable H&H.  Symptoms appear to be related to respiratory issues. Follow-up with PCP Heart healthy low-sodium diet Continue albuterol  use  Essential hypertension-BP today***. Maintain blood pressure log Heart healthy low-sodium diet Continue Imdur , hydralazine , labetalol , amlodipine , valsartan , spironolactone  Repeat BMP   OSA-reports compliance with CPAP.  Waking up well rested.  Discussed benefits of CPAP use and encourage compliance. Avoid supine sleeping Continue CPAP use Continue weight loss   CKD stage IIIa-creatinine*** Avoid nephrotoxic agents Follows with PCP   Morbid obesity-weight today***.  Continues to plan for bariatric surgery. Reduced calorie diet Increase physical activity as tolerated Continue semaglutide    Disposition: Follow-up with Dr. Norris Bee or me in 6 months.  Home Medications    Prior to Admission medications   Medication Sig Start Date End Date Taking? Authorizing Provider  acetaminophen  (TYLENOL ) 500 MG tablet Take 1 tablet (500 mg total) by mouth every 6 (six) hours as needed. 05/20/23   Horton, Vonzella Guernsey, MD  albuterol  (VENTOLIN  HFA) 108 (90 Base) MCG/ACT inhaler Inhale into the lungs. 06/02/17   [provider]  allopurinol  (ZYLOPRIM ) 100 MG tablet TAKE 1 TABLET(100 MG) BY MOUTH DAILY 03/10/23   de [place], Alonza Jansky, MD  amLODipine  (NORVASC ) 10 MG tablet TAKE 1 TABLET(10 MG) BY MOUTH DAILY 12/23/22   Tobb, Kardie, DO  colchicine  0.6 MG tablet Take 1 tablet (0.6 mg total) by mouth 2 (two) times daily. 03/10/23   de [place], [name], MD  furosemide  (LASIX ) 20 MG tablet Take 1 tablet (20 mg total) by mouth daily as needed. 02/02/24   [place], Alonza Jansky, MD  hydrALAZINE  (APRESOLINE ) 100 MG tablet Take 1 tablet (100 mg total) by mouth 3 (three) times daily. 12/02/23   Tobb,  Kardie, DO  ibuprofen  (ADVIL ) 200 MG tablet Take 600 mg by mouth.    [provider]  isosorbide  mononitrate (IMDUR ) 30 MG 24 hr tablet TAKE 1 TABLET(30 MG) BY MOUTH DAILY 06/10/23   Tobb, Kardie, DO  isosorbide  mononitrate (IMDUR ) 30 MG 24 hr tablet Take 1 tablet (30 mg total) by mouth daily. 12/02/23   Tobb, Kardie, DO  labetalol  (NORMODYNE ) 300 MG tablet TAKE 1 TABLET(300 MG) BY MOUTH TWICE DAILY 09/12/23   Tobb, Kardie, DO  spironolactone  (ALDACTONE ) 25 MG tablet TAKE 1 TABLET(25 MG) BY MOUTH DAILY 06/24/23   Tobb, Kardie, DO  valsartan  (DIOVAN ) 160 MG tablet TAKE 1 TABLET(160 MG) BY MOUTH TWICE DAILY 07/26/23   Tobb, Kardie, DO  LISINOPRIL -HYDROCHLOROTHIAZIDE  PO Take by mouth.  07/06/19  [provider]    Family History    Family History  Problem Relation Age of Onset   Hypertension Mother    Multiple sclerosis Mother    Hypertension Father    Hypertension Brother    Hyperlipidemia Paternal Grandfather    Heart  failure Paternal Grandfather    Diabetes Maternal Grandmother    Heart failure Maternal Grandfather    CVA Maternal Uncle    He indicated that his mother is alive. He indicated that his father is alive. He indicated that the status of his brother is unknown. He indicated that the status of his maternal grandmother is unknown. He indicated that the status of his maternal grandfather is unknown. He indicated that the status of his paternal grandfather is unknown. He indicated that the status of his maternal uncle is unknown.  Social History    Social History   Socioeconomic History   Marital status: Single    Spouse name: Not on file   Number of children: 2   Years of education: college   Highest education level: Some college, no degree  Occupational History   Occupation: unemployment    Associate Professor: UNEMPLOYED    Employer: [employer]  Tobacco Use   Smoking status: Every Day    Current packs/day: 0.00    Average packs/day: 0.1 packs/day for 16.0  years (1.6 ttl pk-yrs)    Types: Cigarettes    Start date: 06/08/2006    Last attempt to quit: 06/08/2022    Years since quitting: 1.6    Passive exposure: Current   Smokeless tobacco: Never   Tobacco comments:    02/01/24 patient smokes 3-6 cigarettes daily  Vaping Use   Vaping status: Never Used  Substance and Sexual Activity   Alcohol use: Not Currently    Comment: sparingly    Drug use: No   Sexual activity: Yes    Partners: Female  Other Topics Concern   Not on file  Social History Narrative   Corporate investment banker   Patient is single and lives alone.   Patient is left-handed.   Patient does not drink any caffeine.   Social Drivers of Corporate investment banker Strain: Low Risk  (11/30/2023)   Received from [facility]   Overall Financial Resource Strain (CARDIA)    Difficulty of Paying Living Expenses: Not hard at all  Food Insecurity: No Food Insecurity (11/30/2023)   Received from [facility]   Hunger Vital Sign    Worried About Running Out of Food in the Last Year: Never true    Ran Out of Food in the Last Year: Never true  Transportation Needs: No Transportation Needs (11/30/2023)   Received from [facility] - Transportation    Lack of Transportation (Medical): No    Lack of Transportation (Non-Medical): No  Physical Activity: Insufficiently Active (03/10/2023)   Exercise Vital Sign    Days of Exercise per Week: 4 days    Minutes of Exercise per Session: 20 min  Stress: No Stress Concern Present (01/20/2024)   Received from [facility] - Occupational Stress Questionnaire    Feeling of Stress : Not at all  Social Connections: Socially Isolated (07/01/2022)   Social Connection and Isolation Panel [NHANES]    Frequency of Communication with Friends and Family: Once a week    Frequency of Social Gatherings with Friends and Family: Once a week    Attends Religious Services: 1 to 4 times per year    Active Member  of [employer] or Organizations: No    Attends Banker Meetings: Never    Marital Status: Never married  Intimate Partner Violence: Not At Risk (01/20/2024)   Received from [facility]   HITS  Over the last 12 months how often did your partner physically hurt you?: Never    Over the last 12 months how often did your partner insult you or talk down to you?: Never    Over the last 12 months how often did your partner threaten you with physical harm?: Never    Over the last 12 months how often did your partner scream or curse at you?: Never     Review of Systems    General:  No chills, fever, night sweats or weight changes.  Cardiovascular:  No chest pain, dyspnea on exertion, edema, orthopnea, palpitations, paroxysmal nocturnal dyspnea. Dermatological: No rash, lesions/masses Respiratory: No cough, dyspnea Urologic: No hematuria, dysuria Abdominal:   No nausea, vomiting, diarrhea, bright red blood per rectum, melena, or hematemesis Neurologic:  No visual changes, wkns, changes in mental status. All other systems reviewed and are otherwise negative except as noted above.  Physical Exam    VS:  There were no vitals taken for this visit. , BMI There is no height or weight on file to calculate BMI. GEN: Well nourished, well developed, in no acute distress. HEENT: normal. Neck: Supple, no JVD, carotid bruits, or masses. Cardiac: RRR, no murmurs, rubs, or gallops. No clubbing, cyanosis, edema.  Radials/DP/PT 2+ and equal bilaterally.  Respiratory:  Respirations regular and unlabored, clear to auscultation bilaterally. GI: Soft, nontender, nondistended, BS + x 4. MS: no deformity or atrophy. Skin: warm and dry, no rash. Neuro:  Strength and sensation are intact. Psych: Normal affect.  Accessory Clinical Findings    Recent Labs: 12/19/2023: B Natriuretic Peptide 16.6 01/28/2024: ALT 25; BUN 26; Creatinine, Ser 2.03; Hemoglobin 14.6; Platelets 243; Potassium 4.2; Pro Brain  Natriuretic Peptide 72.7; Sodium 141 02/08/2024: TSH 0.972   Recent Lipid Panel    Component Value Date/Time   CHOL 136 02/27/2022 0756   CHOL 149 05/30/2019 1500   TRIG 74 02/27/2022 0756   HDL 28 (L) 02/27/2022 0756   HDL 34 (L) 05/30/2019 1500   CHOLHDL 4.9 02/27/2022 0756   VLDL 15 02/27/2022 0756   LDLCALC 93 02/27/2022 0756   LDLCALC 93 05/30/2019 1500    No BP recorded.  {Refresh Note OR Click here to enter BP  :1}***    ECG personally reviewed by me today- ***    Echocardiogram 11/20/2022   IMPRESSIONS     1. Left ventricular ejection fraction, by estimation, is 60 to 65%. The  left ventricle has normal function. The left ventricle has no regional  wall motion abnormalities. There is mild concentric left ventricular  hypertrophy. Left ventricular diastolic  parameters were normal.   2. Right ventricular systolic function is normal. The right ventricular  size is normal. There is normal pulmonary artery systolic pressure.   3. The mitral valve is normal in structure. Trivial mitral valve  regurgitation. No evidence of mitral stenosis.   4. The aortic valve is tricuspid. Aortic valve regurgitation is mild. No  aortic stenosis is present.   5. The inferior vena cava is normal in size with greater than 50%  respiratory variability, suggesting right atrial pressure of 3 mmHg.   Comparison(s): No significant change from prior study.   FINDINGS   Left Ventricle: Left ventricular ejection fraction, by estimation, is 60  to 65%. The left ventricle has normal function. The left ventricle has no  regional wall motion abnormalities. Definity  contrast agent was given IV  to delineate the left ventricular   endocardial borders. The left ventricular internal  cavity size was normal  in size. There is mild concentric left ventricular hypertrophy. Left  ventricular diastolic parameters were normal.   Right Ventricle: The right ventricular size is normal. No increase in  right  ventricular wall thickness. Right ventricular systolic function is  normal. There is normal pulmonary artery systolic pressure. The tricuspid  regurgitant velocity is 2.24 m/s, and   with an assumed right atrial pressure of 3 mmHg, the estimated right  ventricular systolic pressure is 23.1 mmHg.   Left Atrium: Left atrial size was normal in size.   Right Atrium: Right atrial size was normal in size.   Pericardium: There is no evidence of pericardial effusion.   Mitral Valve: The mitral valve is normal in structure. Trivial mitral  valve regurgitation. No evidence of mitral valve stenosis.   Tricuspid Valve: The tricuspid valve is normal in structure. Tricuspid  valve regurgitation is trivial.   Aortic Valve: The aortic valve is tricuspid. Aortic valve regurgitation is  mild. Aortic regurgitation PHT measures 572 msec. No aortic stenosis is  present. Aortic valve mean gradient measures 6.0 mmHg. Aortic valve peak  gradient measures 10.4 mmHg.  Aortic valve area, by VTI measures 2.98 cm.   Pulmonic Valve: The pulmonic valve was normal in structure. Pulmonic valve  regurgitation is trivial.   Aorta: The aortic root is normal in size and structure.   Venous: The inferior vena cava is normal in size with greater than 50%  respiratory variability, suggesting right atrial pressure of 3 mmHg.   IAS/Shunts: The atrial septum is grossly normal.       Assessment & Plan   1.  ***   Chet Cota. Harace Mccluney NP-C     02/15/2024, 6:20 PM [facility] [address] Office [phone] Fax [phone]    I spent***minutes examining this patient, reviewing medications, and using patient centered shared decision making involving their cardiac care.   I spent  20 minutes reviewing past medical history,  medications, and prior cardiac tests.

## 2024-02-17 ENCOUNTER — Ambulatory Visit: Attending: General Practice | Admitting: General Practice

## 2024-02-18 ENCOUNTER — Emergency Department (HOSPITAL_BASED_OUTPATIENT_CLINIC_OR_DEPARTMENT_OTHER)
Admission: EM | Admit: 2024-02-18 | Discharge: 2024-02-18 | Disposition: A | Discharge status: Home / Self Care | Attending: Emergency Medicine | Admitting: Emergency Medicine

## 2024-02-18 ENCOUNTER — Other Ambulatory Visit (HOSPITAL_BASED_OUTPATIENT_CLINIC_OR_DEPARTMENT_OTHER): Payer: Self-pay

## 2024-02-18 ENCOUNTER — Emergency Department (HOSPITAL_BASED_OUTPATIENT_CLINIC_OR_DEPARTMENT_OTHER)

## 2024-02-18 DIAGNOSIS — I509 Heart failure, unspecified: Secondary | ICD-10-CM | POA: Diagnosis not present

## 2024-02-18 DIAGNOSIS — Z79899 Other long term (current) drug therapy: Secondary | ICD-10-CM | POA: Diagnosis not present

## 2024-02-18 DIAGNOSIS — M542 Cervicalgia: Secondary | ICD-10-CM | POA: Diagnosis present

## 2024-02-18 DIAGNOSIS — N189 Chronic kidney disease, unspecified: Secondary | ICD-10-CM | POA: Insufficient documentation

## 2024-02-18 DIAGNOSIS — M5412 Radiculopathy, cervical region: Secondary | ICD-10-CM | POA: Insufficient documentation

## 2024-02-18 DIAGNOSIS — I130 Hypertensive heart and chronic kidney disease with heart failure and stage 1 through stage 4 chronic kidney disease, or unspecified chronic kidney disease: Secondary | ICD-10-CM | POA: Insufficient documentation

## 2024-02-18 MED ORDER — PREDNISONE 20 MG PO TABS
ORAL_TABLET | ORAL | 0 refills | Status: DC
  Filled 2024-02-18: qty 20, 12d supply, fill #0

## 2024-02-18 MED ORDER — CYCLOBENZAPRINE HCL 10 MG PO TABS
10.0000 mg | ORAL_TABLET | Freq: Two times a day (BID) | ORAL | 0 refills | Status: DC | PRN
  Filled 2024-02-18: qty 14, 7d supply, fill #0

## 2024-02-18 NOTE — ED Triage Notes (Signed)
 Pt c/I RT side neck pain, concern for pinched nerve. Reports opening difficult pkg and felt a sharp pain in neck x 4 days pta. Tylenol1000 mg at 0900. Tx at [facility] after injury, currently taking prednisone 

## 2024-02-18 NOTE — Discharge Instructions (Signed)
 Please read and follow all provided instructions.  Your diagnoses today include:  1. Cervical radiculopathy     Tests performed today include: CT scan of the neck: Shows some arthritis changes and spurring, there is some nerve pinching higher up in the spine, unfortunately this is not a very clear picture of the lower spine Vital signs. See below for your results today.   Medications prescribed:  Prednisone  - steroid medicine   It is best to take this medication in the morning to prevent sleeping problems. If you are diabetic, monitor your blood sugar closely and stop taking Prednisone  if blood sugar is over 300. Take with food to prevent stomach upset.   Flexeril  (cyclobenzaprine ) - muscle relaxer medication  DO NOT drive or perform any activities that require you to be awake and alert because this medicine can make you drowsy.   Take any prescribed medications only as directed.  Home care instructions:  Follow any educational materials contained in this packet.  BE VERY CAREFUL not to take multiple medicines containing Tylenol  (also called acetaminophen ). Doing so can lead to an overdose which can damage your liver and cause liver failure and possibly death.   Follow-up instructions: Please follow-up with your primary care provider in the next 5 days for further evaluation of your symptoms.   Return instructions:  Please return to the Emergency Department if you experience worsening symptoms.  Please return if you have any other emergent concerns.  Additional Information:  Your vital signs today were: BP (!) 147/86   Pulse 67   Temp 99.1 F (37.3 C) (Oral)   Resp 17   SpO2 98%  If your blood pressure (BP) was elevated above 135/85 this visit, please have this repeated by your doctor within one month. --------------

## 2024-02-18 NOTE — ED Provider Notes (Signed)
 [place] EMERGENCY DEPARTMENT AT [facility] Provider Note   CSN: [id number] Arrival date & time: 02/18/24  1143     History  No chief complaint on file.   Calvin Bailey is a 43 y.o. male.  Patient with history of GERD, hypertension, chronic kidney disease, heart failure --presents to the emergency department today for evaluation of neck pain on the right neck with radiation into the right arm down to his hands.  He feels that sensation of "sand" on the fingertips.  He has had some weakness in the right arm.  No lower extremity symptoms.  No left-sided symptoms. Patient denies warning symptoms of back pain including: fecal incontinence, urinary retention or overflow incontinence, night sweats, waking from sleep with back pain, unexplained fevers or weight loss, h/o cancer, IVDU, recent trauma.  States that he was seen at urgent care on Thursday.  Started on prednisone  burst.  He is also been taking Tylenol .  He has been having difficulty sleeping due to the symptoms.  No history of similar symptoms.        Home Medications Prior to Admission medications   Medication Sig Start Date End Date Taking? Authorizing Provider  acetaminophen  (TYLENOL ) 500 MG tablet Take 1 tablet (500 mg total) by mouth every 6 (six) hours as needed. 05/20/23   Horton, Vonzella Guernsey, MD  albuterol  (VENTOLIN  HFA) 108 [phone] Base) MCG/ACT inhaler Inhale into the lungs. 06/02/17   [provider]  allopurinol  (ZYLOPRIM ) 100 MG tablet TAKE 1 TABLET(100 MG) BY MOUTH DAILY 03/10/23   de [place], Alonza Jansky, MD  amLODipine  (NORVASC ) 10 MG tablet TAKE 1 TABLET(10 MG) BY MOUTH DAILY 12/23/22   Tobb, Kardie, DO  colchicine  0.6 MG tablet Take 1 tablet (0.6 mg total) by mouth 2 (two) times daily. 03/10/23   de [place], [name], MD  furosemide  (LASIX ) 20 MG tablet Take 1 tablet (20 mg total) by mouth daily as needed. 02/02/24   [place], Alonza Jansky, MD  hydrALAZINE  (APRESOLINE ) 100 MG tablet Take 1 tablet (100 mg total) by  mouth 3 (three) times daily. 12/02/23   Tobb, Kardie, DO  ibuprofen  (ADVIL ) 200 MG tablet Take 600 mg by mouth.    [provider]  isosorbide  mononitrate (IMDUR ) 30 MG 24 hr tablet TAKE 1 TABLET(30 MG) BY MOUTH DAILY 06/10/23   Tobb, Kardie, DO  isosorbide  mononitrate (IMDUR ) 30 MG 24 hr tablet Take 1 tablet (30 mg total) by mouth daily. 12/02/23   Tobb, Kardie, DO  labetalol  (NORMODYNE ) 300 MG tablet TAKE 1 TABLET(300 MG) BY MOUTH TWICE DAILY 09/12/23   Tobb, Kardie, DO  spironolactone  (ALDACTONE ) 25 MG tablet TAKE 1 TABLET(25 MG) BY MOUTH DAILY 06/24/23   Tobb, Kardie, DO  valsartan  (DIOVAN ) 160 MG tablet TAKE 1 TABLET(160 MG) BY MOUTH TWICE DAILY 07/26/23   Tobb, Kardie, DO  LISINOPRIL -HYDROCHLOROTHIAZIDE  PO Take by mouth.  07/06/19  [provider]      Allergies    Hydrochlorothiazide , Chlorthalidone , and Lisinopril     Review of Systems   Review of Systems  Physical Exam Updated Vital Signs BP (!) 147/86   Pulse 67   Temp 99.1 F (37.3 C) (Oral)   Resp 17   SpO2 98%  Physical Exam Vitals and nursing note reviewed.  Constitutional:      Appearance: He is well-developed.  HENT:     Head: Normocephalic and atraumatic.  Eyes:     Conjunctiva/sclera: Conjunctivae normal.  Pulmonary:     Effort: No respiratory distress.  Musculoskeletal:     Cervical back: Normal range of motion and neck supple.  Skin:    General: Skin is warm and dry.  Neurological:     Mental Status: He is alert.     Motor: Weakness present.     Comments: Upper extremity myotomes tested bilaterally:  C5 Shoulder abduction 5/5 C6 Elbow flexion/wrist extension 4+/5 on right, 5/5 on left C7 Elbow extension 4+/5 on right, 5/5 on left C8 Finger flexion 4+/5 on right, 5/5 on left T1 Finger abduction 5/5  Lower extremity myotomes tested bilaterally: L4 Ankle dorsiflexion 5/5 S1 Ankle plantar flexion 5/5      ED Results / Procedures / Treatments   Labs (all labs ordered are listed, but  only abnormal results are displayed) Labs Reviewed - No data to display  EKG None  Radiology No results found.  Procedures Procedures    Medications Ordered in ED Medications - No data to display  ED Course/ Medical Decision Making/ A&P    Patient seen and examined. History obtained directly from patient.   Labs/EKG: None ordered  Imaging: Given that this is a new problem for patient, will obtain CT imaging of the cervical spine  Medications/Fluids: None ordered  Most recent vital signs reviewed and are as follows: BP (!) 147/86   Pulse 67   Temp 99.1 F (37.3 C) (Oral)   Resp 17   SpO2 98%   Initial impression: Likely right-sided cervical radiculopathy.  No evidence of a central cord etiology at this time.  Agree with prednisone .  1:08 PM Reassessment performed. Patient appears stable.  Imaging personally visualized and interpreted including: CT imaging of the cervical spine, agree with arthritic changes, there is difficulty with visualization due to habitus.  Reviewed pertinent imaging with patient at bedside. Questions answered.   Most current vital signs reviewed and are as follows: BP (!) 147/86   Pulse 67   Temp 99.1 F (37.3 C) (Oral)   Resp 17   SpO2 98%   Plan: Discharge to home.  Recommended longer taper course of prednisone .  Prescriptions written for: Prednisone  and Flexeril   Patient counseled on proper use of muscle relaxant medication.  They were told not to drink alcohol, drive any vehicle, or do any dangerous activities while taking this medication.  Patient verbalized understanding.  Other home care instructions discussed: Rest, avoidance of heavy lifting, pushing, pulling  ED return instructions discussed: Worsening weakness, uncontrolled pain, lower extremity symptoms, new or worsening symptoms.  Follow-up instructions discussed: Patient encouraged to follow-up with their PCP in 7 days.  Also provided neurosurgery referral.                                  Medical Decision Making Amount and/or Complexity of Data Reviewed Radiology: ordered.  Risk Prescription drug management.   Patient with right upper extremity weakness and radiating pain, suggestive of cervical radiculopathy.  Arthritic changes noted on CT today.  Patient without lower extremity symptoms.  Do not suspect central cord problem today.  Agree with ongoing prednisone , will prolong course.  Patient would benefit from PCP/outpatient neurosurgery follow-up.  No indication for emergent consultation today.  The patient's vital signs, pertinent lab work and imaging were reviewed and interpreted as discussed in the ED course. Hospitalization was considered for further testing, treatments, or serial exams/observation. However as patient is well-appearing, has a stable exam, and reassuring studies today, I do not feel that  they warrant admission at this time. This plan was discussed with the patient who verbalizes agreement and comfort with this plan and seems reliable and able to return to the Emergency Department with worsening or changing symptoms.          Final Clinical Impression(s) / ED Diagnoses Final diagnoses:  Cervical radiculopathy    Rx / DC Orders ED Discharge Orders          Ordered    predniSONE  (DELTASONE ) 20 MG tablet        02/18/24 1306    cyclobenzaprine  (FLEXERIL ) 10 MG tablet  2 times daily PRN        02/18/24 1306              Lyna Sandhoff, PA-C 02/18/24 1310    Zackowski, Scott, MD 02/19/24 [phone]

## 2024-02-24 ENCOUNTER — Ambulatory Visit (INDEPENDENT_AMBULATORY_CARE_PROVIDER_SITE_OTHER): Admitting: Family Medicine

## 2024-02-24 ENCOUNTER — Inpatient Hospital Stay: Admitting: Student

## 2024-02-24 ENCOUNTER — Encounter (HOSPITAL_BASED_OUTPATIENT_CLINIC_OR_DEPARTMENT_OTHER): Payer: Self-pay | Admitting: Family Medicine

## 2024-02-24 VITALS — BP 150/91 | HR 62 | Ht 73.0 in | Wt >= 6400 oz

## 2024-02-24 DIAGNOSIS — M5412 Radiculopathy, cervical region: Secondary | ICD-10-CM | POA: Insufficient documentation

## 2024-02-24 NOTE — Assessment & Plan Note (Signed)
 Patient presents to the emergency department for evaluation of neck pain.  He was also having radiation of pain into right arm and also had some changes to sensation.  Patient additionally reported some weakness.  He ultimately had CT scan performed in the ED with arthritic changes noted.  He did have steroid taper extended in the emergency department and had evaluation with [facility] after emergency department visit.  He did have MRI arranged which she has completed.  He has not received results of this yet but has appointment next week with [facility]. Reviewed records from emergency department visit.  Given that he does have appointment upcoming with [facility] next week, recommend that he continue with scheduled follow-up.  We did review general considerations related to management options.

## 2024-02-24 NOTE — Progress Notes (Signed)
    Procedures performed today:    None.  Independent interpretation of notes and tests performed by another provider:   None.  Brief History, Exam, Impression, and Recommendations:    BP (!) 150/91 (BP Location: Left Arm, Patient Position: Sitting, Cuff Size: Large)   Pulse 62   Ht 6\' 1"  (1.854 m)   Wt (!) 404 lb 9.6 oz (183.5 kg)   SpO2 98%   BMI 53.38 kg/m   Cervical radiculopathy Assessment & Plan: Patient presents to the emergency department for evaluation of neck pain.  He was also having radiation of pain into right arm and also had some changes to sensation.  Patient additionally reported some weakness.  He ultimately had CT scan performed in the ED with arthritic changes noted.  He did have steroid taper extended in the emergency department and had evaluation with [facility] after emergency department visit.  He did have MRI arranged which she has completed.  He has not received results of this yet but has appointment next week with [facility]. Reviewed records from emergency department visit.  Given that he does have appointment upcoming with [facility] next week, recommend that he continue with scheduled follow-up.  We did review general considerations related to management options.   Return if symptoms worsen or fail to improve.   ___________________________________________ Catrinia Racicot de [place], MD, ABFM, [facility]

## 2024-02-24 NOTE — Patient Instructions (Signed)

## 2024-03-05 ENCOUNTER — Other Ambulatory Visit: Payer: Self-pay | Admitting: Diagnostic Radiology

## 2024-03-05 DIAGNOSIS — N2889 Other specified disorders of kidney and ureter: Secondary | ICD-10-CM

## 2024-03-07 ENCOUNTER — Ambulatory Visit
Admission: RE | Admit: 2024-03-07 | Discharge: 2024-03-07 | Disposition: A | Discharge status: Home / Self Care | Source: Ambulatory Visit | Attending: Diagnostic Radiology | Admitting: Diagnostic Radiology

## 2024-03-07 ENCOUNTER — Ambulatory Visit: Admitting: Internal Medicine

## 2024-03-07 DIAGNOSIS — N2889 Other specified disorders of kidney and ureter: Secondary | ICD-10-CM

## 2024-03-07 HISTORY — PX: IR RADIOLOGIST EVAL & MGMT: IMG5224

## 2024-03-07 NOTE — Progress Notes (Addendum)
 Chief Complaint: Patient was consulted remotely today (TeleHealth) for follow-up of right renal lesion cryoablation.  Referring Physician(s): Winter, C  History of Present Illness: Calvin Bailey is a 43 y.o. 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  He underwent cryoablation of the right renal cell carcinoma on 02/16/2023.  Following procedure, he had back pain and had a follow-up CT that demonstrated a small subcapsular hematoma associated with the treated lesion.  Patient also has an indeterminate lesion in the right kidney upper pole which likely represents a complex or hemorrhagic cyst. He had a follow-up CT of the abdomen with and without contrast on 02/01/24.  He denies urinary symptoms.  No hematuria or dysuria.  His main complaint is neck pain that radiates to the right arm.  He was in the ED on 02/18/24 for the neck symptoms and currently on disability.  He reports that he is scheduled to have an epidural injection. ]  PMH is significant for CKD, essential hypertension, morbid obesity and obstructive sleep apnea.   Past Medical History:  Diagnosis Date   Arthritis    Cancer (HCC)    right kidney   CHF (congestive heart failure) (HCC)    pt. denies   Chronic kidney disease    Dizziness 09/25/2019   GERD (gastroesophageal reflux disease)    HTN (hypertension)    Obesity hypoventilation syndrome (HCC) 04/08/2014   OSA (obstructive sleep apnea) 04/08/2014   PFO (patent foramen ovale)    Pneumonia    Sleep apnea    wears Cpap    Past Surgical History:  Procedure Laterality Date   RADIOLOGY WITH ANESTHESIA Right 02/16/2023   Procedure: RIGHT RENAL MASS CRYO ABLATION;  Surgeon: Elene Griffes, MD;  Location: [facility];  Service: Anesthesiology;  Laterality: Right;   two knee surgeries Left 1995    Allergies: Hydrochlorothiazide , Chlorthalidone , and Lisinopril   Medications: Prior to Admission medications   Medication Sig Start Date End  Date Taking? Authorizing Provider  acetaminophen  (TYLENOL ) 500 MG tablet Take 1 tablet (500 mg total) by mouth every 6 (six) hours as needed. 05/20/23   Horton, Vonzella Guernsey, MD  albuterol  (VENTOLIN  HFA) 108 (90 Base) MCG/ACT inhaler Inhale into the lungs. 06/02/17   [provider]  allopurinol  (ZYLOPRIM ) 100 MG tablet TAKE 1 TABLET(100 MG) BY MOUTH DAILY 03/10/23   [place], Alonza Jansky, MD  amLODipine  (NORVASC ) 10 MG tablet TAKE 1 TABLET(10 MG) BY MOUTH DAILY 12/23/22   Tobb, Kardie, DO  colchicine  0.6 MG tablet Take 1 tablet (0.6 mg total) by mouth 2 (two) times daily. 03/10/23   [place], Alonza Jansky, MD  cyclobenzaprine  (FLEXERIL ) 10 MG tablet Take 1 tablet (10 mg total) by mouth 2 (two) times daily as needed for muscle spasms. 02/18/24   Geiple, Joshua, PA-C  furosemide  (LASIX ) 20 MG tablet Take 1 tablet (20 mg total) by mouth daily as needed. 02/02/24   [place], Alonza Jansky, MD  hydrALAZINE  (APRESOLINE ) 100 MG tablet Take 1 tablet (100 mg total) by mouth 3 (three) times daily. 12/02/23   Tobb, Kardie, DO  ibuprofen  (ADVIL ) 200 MG tablet Take 600 mg by mouth.    [provider]  isosorbide  mononitrate (IMDUR ) 30 MG 24 hr tablet TAKE 1 TABLET(30 MG) BY MOUTH DAILY 06/10/23   Tobb, Kardie, DO  isosorbide  mononitrate (IMDUR ) 30 MG 24 hr tablet Take 1 tablet (30 mg total) by mouth daily. 12/02/23   Tobb, Kardie, DO  labetalol  (NORMODYNE ) 300 MG  tablet TAKE 1 TABLET(300 MG) BY MOUTH TWICE DAILY 09/12/23   Tobb, Kardie, DO  predniSONE  (DELTASONE ) 20 MG tablet Take 3 tablets (60mg  total) by mouth for 3 days, then take 2 tablets (40mg  total) for 3 days, then take 1 tablet (20mg ) for 3 days, then take HALF tablet (10mg ) for 3 days. 02/18/24   Geiple, Joshua, PA-C  spironolactone  (ALDACTONE ) 25 MG tablet TAKE 1 TABLET(25 MG) BY MOUTH DAILY 06/24/23   Tobb, Kardie, DO  valsartan  (DIOVAN ) 160 MG tablet TAKE 1 TABLET(160 MG) BY MOUTH TWICE DAILY 07/26/23   Tobb, Kardie, DO  LISINOPRIL -HYDROCHLOROTHIAZIDE  PO Take  by mouth.  07/06/19  [provider]     Family History  Problem Relation Age of Onset   Hypertension Mother    Multiple sclerosis Mother    Hypertension Father    Hypertension Brother    Hyperlipidemia Paternal Grandfather    Heart failure Paternal Grandfather    Diabetes Maternal Grandmother    Heart failure Maternal Grandfather    CVA Maternal Uncle     Social History   Socioeconomic History   Marital status: Single    Spouse name: Not on file   Number of children: 2   Years of education: college   Highest education level: Some college, no degree  Occupational History   Occupation: unemployment    Associate Professor: UNEMPLOYED    Employer: [employer]  Tobacco Use   Smoking status: Every Day    Current packs/day: 0.00    Average packs/day: 0.1 packs/day for 16.0 years (1.6 ttl pk-yrs)    Types: Cigarettes    Start date: 06/08/2006    Last attempt to quit: 06/08/2022    Years since quitting: 1.7    Passive exposure: Current   Smokeless tobacco: Never   Tobacco comments:    02/01/24 patient smokes 3-6 cigarettes daily  Vaping Use   Vaping status: Never Used  Substance and Sexual Activity   Alcohol use: Not Currently    Comment: sparingly    Drug use: No   Sexual activity: Yes    Partners: Female  Other Topics Concern   Not on file  Social History Narrative   Corporate investment banker   Patient is single and lives alone.   Patient is left-handed.   Patient does not drink any caffeine.   Social Drivers of Corporate investment banker Strain: Low Risk  (11/30/2023)   Received from [facility]   Overall Financial Resource Strain (CARDIA)    Difficulty of Paying Living Expenses: Not hard at all  Food Insecurity: No Food Insecurity (11/30/2023)   Received from [facility]   Hunger Vital Sign    Worried About Running Out of Food in the Last Year: Never true    Ran Out of Food in the Last Year: Never true  Transportation Needs: No Transportation Needs (11/30/2023)    Received from [facility] - Transportation    Lack of Transportation (Medical): No    Lack of Transportation (Non-Medical): No  Physical Activity: Insufficiently Active (03/10/2023)   Exercise Vital Sign    Days of Exercise per Week: 4 days    Minutes of Exercise per Session: 20 min  Stress: No Stress Concern Present (01/20/2024)   Received from [facility] - Occupational Stress Questionnaire    Feeling of Stress : Not at all  Social Connections: Socially Isolated (07/01/2022)   Social Connection and Isolation Panel [NHANES]  Frequency of Communication with Friends and Family: Once a week    Frequency of Social Gatherings with Friends and Family: Once a week    Attends Religious Services: 1 to 4 times per year    Active Member of [employer] or Organizations: No    Attends Banker Meetings: Never    Marital Status: Never married      Review of Systems  Constitutional: Negative.   Respiratory:  Positive for shortness of breath.   Cardiovascular:  Negative for chest pain.  Genitourinary: Negative.       Physical Exam Constitutional:      Appearance: He is obese. He is not ill-appearing.  Pulmonary:     Effort: Pulmonary effort is normal.  Neurological:     Mental Status: He is alert.     Vital Signs: There were no vitals taken for this visit.  Imaging: CT Cervical Spine Wo Contrast Result Date: 02/18/2024 CLINICAL DATA:  Cervical radiculopathy EXAM: CT CERVICAL SPINE WITHOUT CONTRAST TECHNIQUE: Multidetector CT imaging of the cervical spine was performed without intravenous contrast. Multiplanar CT image reconstructions were also generated. RADIATION DOSE REDUCTION: This exam was performed according to the departmental dose-optimization program which includes automated exposure control, adjustment of the mA and/or kV according to patient size and/or use of iterative reconstruction technique. COMPARISON:  None  Available. FINDINGS: Alignment: Straightening of cervical lordosis. Skull base and vertebrae: No acute fracture. No primary bone lesion or focal pathologic process. Soft tissues and spinal canal: No prevertebral fluid or swelling. No visible canal hematoma. Disc levels: Limited assessment of soft tissues given body habitus, a disc herniation when would likely not be visible. Multilevel endplate and uncovertebral spurring, uncovertebral spurring focally pronounced on the right at C2-3 where there is foraminal impingement. Elsewhere on the symptomatic right side no bony impingement to explain right arm symptoms Upper chest: Negative. IMPRESSION: Scattered degenerative spurring without acute finding. Electronically Signed   By: Ronnette Coke M.D.   On: 02/18/2024 12:46    CT ABDOMEN W WO CONTRAST 02/01/2024 9:03 AM CDT   CLINICAL HISTORY: Male, 43 years old. Follow-up post kidney cryoablation. Follow up to post Right Kidney cryoablation; 100 mL isovue  300; GFR 41   COMPARISON: CT abdomen from 08/19/2023   PROCEDURE COMMENTS: CT of the abdomen was performed following uneventful administration of IV contrast. Oral contrast was not administered prior to the examination. CT was performed using one or more dose reduction techniques including automated exposure control, adjustment of the mA and/or kV according to patient size, and/or use of iterative reconstruction technique.Unless otherwise stated, incidental findings identified in this report do not require routine follow-up. 3-D reconstruction volume rendered imaging was performed on a separate workstation if necessary. Unless otherwise stated, incidental findings identified in this report do not require routine follow-up.   FINDINGS:   Lower thorax: Bilateral lung bases are clear. No pleural or pericardial effusion. Heart is of normal size.   Liver and biliary tree: Liver is normal in morphology. No focal liver lesions are identified. Hepatic,  portal, and superior mesenteric veins are patent. No intra- or extrahepatic biliary dilatation.   Gallbladder: Normal   Spleen: Normal. Small splenule  inferior to the splenic parenchyma.   Pancreas: Normal.   Adrenal glands: Mild thickening of bilateral adrenal glands.   Kidneys and ureters: Symmetric bilateral enhancement. No hydronephrosis. No radiopaque renal calculi. The anterior post procedural changes compatible with prior cryoablation to the right mid pole exophytic mass. The ablation cavity appears to  have slightly involuted when compared to the prior examination and currently measures 1.9 x 1.4 cm. Previously this area had measured 2.2 x 1.9 cm. No abnormal enhancement is identified on postcontrast images and within the cryoablation bed to suggest local recurrence or residual disease.   Gastrointestinal tract: No bowel obstruction or wall thickening. Normal appendix in the right lower quadrant.   Peritoneal cavity: No free fluid or free air.   Vasculature: Scattered calcified and noncalcified atherosclerotic calcifications involving the abdominal aorta. No abdominal aortic aneurysm, dissection or high-grade focal stenosis. IVC is patent.   Lymph nodes: No pathologically enlarged lymphadenopathy in the abdomen or pelvis.   Abdominal wall: Normal.   Musculoskeletal: No suspicious osseous lesions.   IMPRESSION:   1. Status post thermal ablation of the right midpole renal mass, with interval resolution of the posterior medial cavity when compared to the prior study. No suspicious or worrisome abnormal enhancement is identified within the operative bed to suggest local recurrence or residual disease. Continued attention on follow-up is advised.   2. No evidence of new enhancing renal mass appreciated. No hydronephrosis obstructive uropathy or radiopaque renal calculi.   Electronically signed by: Edrie Gower MD 02/01/2024 07:03 PM EDT RP  Labs:  CBC: Recent  Labs    05/20/23 0540 10/10/23 1906 12/19/23 2027 01/28/24 1931  WBC 9.0 6.0 8.3 9.1  HGB 13.0 14.2 14.5 14.6  HCT 39.2 42.7 43.6 43.2  PLT 303 237 256 243    COAGS: No results for input(s): INR, APTT in the last 8760 hours.  BMP: Recent Labs    05/20/23 0540 10/10/23 1906 10/10/23 2010 12/19/23 2027 01/28/24 1931  NA 138 134*  --  139 141  K 4.3 5.9* 6.2* 4.2 4.2  CL 107 104  --  106 106  CO2 22 21*  --  22 23  GLUCOSE 105* 111*  --  102* 116*  BUN 20 25*  --  26* 26*  CALCIUM  8.9 9.3  --  9.0 9.3  CREATININE 1.74* 1.82*  --  1.90* 2.03*  GFRNONAA 49* 46*  --  44* 41*    LIVER FUNCTION TESTS: Recent Labs    03/10/23 1423 05/20/23 0540 12/19/23 2027 01/28/24 1931  BILITOT <0.2 0.2* 0.4 0.2  AST 10 14* 12* 21  ALT 12 13 14 25   ALKPHOS 118 77 73 90  PROT 7.6 7.6 7.5 7.1  ALBUMIN 4.1 4.0 4.5 4.4    TUMOR MARKERS: No results for input(s): AFPTM, CEA, CA199, CHROMGRNA in the last 8760 hours.  Assessment and Plan:  43 year old with biopsy-proven papillary cell renal carcinoma in the right kidney.  The right renal cell carcinoma was treated with cryoablation on 02/16/2023.  Patient has had at least 4 CTs of the abdomen since the procedure for variety of reasons.  Currently he has no urinary symptoms.  Most recent CT of the abdomen was performed on 02/01/2024.  There was no evidence for residual or recurrent tumor at the right renal ablation site.  Patient continues to have a hyperdense lesion along the right kidney upper pole that measures approximately 2.8 cm but there is no significant enhancement in this lesion.  I suspect this is a hemorrhagic or proteinaceous cyst.  This lesion is minimally changed in size since an abdominal MRI on 08/28/2022.  No other areas are concerning for renal lesions.  At this point, the patient needs surveillance of his ablation site and the indeterminate but stable hyperdense lesion in the right kidney upper  pole.  Patient has  chronic kidney disease and his creatinine was 2.03 and GFR was 41 on 01/28/2024.  His creatinine has been slowly increasing over the past year. Will plan for follow-up MRI of the abdomen with and without contrast in 6 months due to the increasing creatinine level.  Patient will need follow-up creatinine prior to the MRI procedure.  Plan to see the patient after the MRI.    Electronically Signed: Salbador Crate 03/07/2024, 8:34 AM   I spent a total of    5 Minutes in remote  clinical consultation, greater than 50% of which was counseling/coordinating care for follow up renal cell carcinoma.    Visit type: Audio and video (EPIC/MyChart).   Patient ID: Calvin Bailey, male   DOB: Apr 14, 1981, 43 y.o.   MRN: [id number]

## 2024-03-27 ENCOUNTER — Ambulatory Visit: Admitting: Internal Medicine

## 2024-03-27 ENCOUNTER — Encounter

## 2024-04-01 ENCOUNTER — Other Ambulatory Visit: Payer: Self-pay | Admitting: Cardiology

## 2024-04-03 ENCOUNTER — Other Ambulatory Visit: Payer: Self-pay

## 2024-04-03 ENCOUNTER — Emergency Department (HOSPITAL_BASED_OUTPATIENT_CLINIC_OR_DEPARTMENT_OTHER)

## 2024-04-03 ENCOUNTER — Emergency Department (HOSPITAL_BASED_OUTPATIENT_CLINIC_OR_DEPARTMENT_OTHER)
Admission: EM | Admit: 2024-04-03 | Discharge: 2024-04-03 | Disposition: A | Discharge status: Home / Self Care | Attending: Emergency Medicine | Admitting: Emergency Medicine

## 2024-04-03 ENCOUNTER — Encounter (HOSPITAL_BASED_OUTPATIENT_CLINIC_OR_DEPARTMENT_OTHER): Payer: Self-pay

## 2024-04-03 DIAGNOSIS — R079 Chest pain, unspecified: Secondary | ICD-10-CM | POA: Diagnosis present

## 2024-04-03 DIAGNOSIS — I2510 Atherosclerotic heart disease of native coronary artery without angina pectoris: Secondary | ICD-10-CM | POA: Diagnosis not present

## 2024-04-03 DIAGNOSIS — I890 Lymphedema, not elsewhere classified: Secondary | ICD-10-CM | POA: Insufficient documentation

## 2024-04-03 DIAGNOSIS — N189 Chronic kidney disease, unspecified: Secondary | ICD-10-CM | POA: Insufficient documentation

## 2024-04-03 DIAGNOSIS — Z79899 Other long term (current) drug therapy: Secondary | ICD-10-CM | POA: Diagnosis not present

## 2024-04-03 DIAGNOSIS — R7989 Other specified abnormal findings of blood chemistry: Secondary | ICD-10-CM | POA: Diagnosis not present

## 2024-04-03 DIAGNOSIS — I129 Hypertensive chronic kidney disease with stage 1 through stage 4 chronic kidney disease, or unspecified chronic kidney disease: Secondary | ICD-10-CM | POA: Diagnosis not present

## 2024-04-03 LAB — BASIC METABOLIC PANEL WITH GFR
Anion gap: 13 (ref 5–15)
BUN: 16 mg/dL (ref 6–20)
CO2: 23 mmol/L (ref 22–32)
Calcium: 9.1 mg/dL (ref 8.9–10.3)
Chloride: 101 mmol/L (ref 98–111)
Creatinine, Ser: 1.63 mg/dL — ABNORMAL HIGH (ref 0.61–1.24)
GFR, Estimated: 53 mL/min — ABNORMAL LOW (ref >60)
Glucose, Bld: 128 mg/dL — ABNORMAL HIGH (ref 70–99)
Potassium: 3.9 mmol/L (ref 3.5–5.1)
Sodium: 136 mmol/L (ref 135–145)

## 2024-04-03 LAB — CBC
HCT: 42.5 % (ref 39.0–52.0)
Hemoglobin: 14.2 g/dL (ref 13.0–17.0)
MCH: 28.7 pg (ref 26.0–34.0)
MCHC: 33.4 g/dL (ref 30.0–36.0)
MCV: 85.9 fL (ref 80.0–100.0)
Platelets: 235 K/uL (ref 150–400)
RBC: 4.95 MIL/uL (ref 4.22–5.81)
RDW: 12.9 % (ref 11.5–15.5)
WBC: 7.6 K/uL (ref 4.0–10.5)
nRBC: 0 % (ref 0.0–0.2)

## 2024-04-03 LAB — TROPONIN T, HIGH SENSITIVITY
Troponin T High Sensitivity: 17 ng/L (ref <19)
Troponin T High Sensitivity: <15 ng/L (ref <19)

## 2024-04-03 LAB — PRO BRAIN NATRIURETIC PEPTIDE: Pro Brain Natriuretic Peptide: <36 pg/mL (ref <300.0)

## 2024-04-03 MED ORDER — LIDOCAINE VISCOUS HCL 2 % MT SOLN
15.0000 mL | Freq: Once | OROMUCOSAL | Status: AC
  Administered 2024-04-03: 15 mL via ORAL
  Filled 2024-04-03: qty 15

## 2024-04-03 MED ORDER — ALUM & MAG HYDROXIDE-SIMETH 200-200-20 MG/5ML PO SUSP
30.0000 mL | Freq: Once | ORAL | Status: AC
  Administered 2024-04-03: 30 mL via ORAL
  Filled 2024-04-03: qty 30

## 2024-04-03 NOTE — ED Provider Notes (Signed)
 [facility] Provider Note   CSN: [id number] Arrival date & time: 04/03/24  1446     Patient presents with: Chest Pain   Calvin Bailey is a 43 y.o. male with past medical history significant for HTN, OSA, GERD, obesity hypoventilation syndrome, CKD, LVH, who presents with concern for central chest pain starting 3 days ago. Pain has remained constant, is dull / achy. SOB with exertion. Reports some indigestion and worse with burping / eating. Does also have some pinched nerves in neck that occasionally radiate down right arm and wondering if it could be related to this. Pain is very dull during our exam. Supposed to see his cardiologist in the morning.     Chest Pain      Prior to Admission medications   Medication Sig Start Date End Date Taking? Authorizing Provider  acetaminophen  (TYLENOL ) 500 MG tablet Take 1 tablet (500 mg total) by mouth every 6 (six) hours as needed. 05/20/23   Horton, Charmaine FALCON, MD  albuterol  (VENTOLIN  HFA) [phone] Base) MCG/ACT inhaler Inhale into the lungs. 06/02/17   [provider]  allopurinol  (ZYLOPRIM ) 100 MG tablet TAKE 1 TABLET(100 MG) BY MOUTH DAILY 03/10/23   de [place], Quintin PARAS, MD  amLODipine  (NORVASC ) 10 MG tablet TAKE 1 TABLET(10 MG) BY MOUTH DAILY 12/23/22   Tobb, Kardie, DO  colchicine  0.6 MG tablet Take 1 tablet (0.6 mg total) by mouth 2 (two) times daily. 03/10/23   de [place], Quintin PARAS, MD  cyclobenzaprine  (FLEXERIL ) 10 MG tablet Take 1 tablet (10 mg total) by mouth 2 (two) times daily as needed for muscle spasms. 02/18/24   Geiple, Joshua, PA-C  furosemide  (LASIX ) 20 MG tablet Take 1 tablet (20 mg total) by mouth daily as needed. 02/02/24   de [place], Quintin PARAS, MD  hydrALAZINE  (APRESOLINE ) 100 MG tablet Take 1 tablet (100 mg total) by mouth 3 (three) times daily. 12/02/23   Tobb, Kardie, DO  ibuprofen  (ADVIL ) 200 MG tablet Take 600 mg by mouth.    [provider]  isosorbide  mononitrate  (IMDUR ) 30 MG 24 hr tablet TAKE 1 TABLET(30 MG) BY MOUTH DAILY 06/10/23   Tobb, Kardie, DO  isosorbide  mononitrate (IMDUR ) 30 MG 24 hr tablet Take 1 tablet (30 mg total) by mouth daily. 12/02/23   Tobb, Kardie, DO  labetalol  (NORMODYNE ) 300 MG tablet TAKE 1 TABLET(300 MG) BY MOUTH TWICE DAILY 09/12/23   Tobb, Kardie, DO  predniSONE  (DELTASONE ) 20 MG tablet Take 3 tablets (60mg  total) by mouth for 3 days, then take 2 tablets (40mg  total) for 3 days, then take 1 tablet (20mg ) for 3 days, then take HALF tablet (10mg ) for 3 days. 02/18/24   Geiple, Joshua, PA-C  spironolactone  (ALDACTONE ) 25 MG tablet TAKE 1 TABLET(25 MG) BY MOUTH DAILY 06/24/23   Tobb, Kardie, DO  valsartan  (DIOVAN ) 160 MG tablet TAKE 1 TABLET(160 MG) BY MOUTH TWICE DAILY 07/26/23   Tobb, Kardie, DO  LISINOPRIL -HYDROCHLOROTHIAZIDE  PO Take by mouth.  07/06/19  [provider]    Allergies: Hydrochlorothiazide , Chlorthalidone , and Lisinopril     Review of Systems  Cardiovascular:  Positive for chest pain.  All other systems reviewed and are negative.   Updated Vital Signs BP 132/68 (BP Location: Right Arm)   Pulse 86   Temp 98.4 F (36.9 C) (Oral)   Resp 16   Ht 6' 1 (1.854 m)   Wt (!) 172.4 kg   SpO2 96%   BMI 50.13 kg/m  Physical Exam Vitals and nursing note reviewed.  Constitutional:      General: He is not in acute distress.    Appearance: Normal appearance.  HENT:     Head: Normocephalic and atraumatic.  Eyes:     General:        Right eye: No discharge.        Left eye: No discharge.  Cardiovascular:     Rate and Rhythm: Normal rate and regular rhythm.     Heart sounds: No murmur heard.    No friction rub. No gallop.  Pulmonary:     Effort: Pulmonary effort is normal.     Breath sounds: Normal breath sounds.  Abdominal:     General: Bowel sounds are normal.     Palpations: Abdomen is soft.  Musculoskeletal:     Comments: Lymphedema of bilateral lower extremities  Skin:    General: Skin is warm  and dry.     Capillary Refill: Capillary refill takes less than 2 seconds.  Neurological:     Mental Status: He is alert and oriented to person, place, and time.  Psychiatric:        Mood and Affect: Mood normal.        Behavior: Behavior normal.     (all labs ordered are listed, but only abnormal results are displayed) Labs Reviewed  BASIC METABOLIC PANEL WITH GFR - Abnormal; Notable for the following components:      Result Value   Glucose, Bld 128 (*)    Creatinine, Ser 1.63 (*)    GFR, Estimated 53 (*)    All other components within normal limits  CBC  PRO BRAIN NATRIURETIC PEPTIDE  TROPONIN T, HIGH SENSITIVITY  TROPONIN T, HIGH SENSITIVITY    EKG: EKG Interpretation Date/Time:  Tuesday April 03 2024 14:54:08 EDT Ventricular Rate:  69 PR Interval:  204 QRS Duration:  106 QT Interval:  400 QTC Calculation: 428 R Axis:   24  Text Interpretation: Normal sinus rhythm Normal ECG When compared with ECG of 28-Jan-2024 19:24, PREVIOUS ECG IS PRESENT Confirmed by Simon Rea [phone]) on 04/03/2024 3:47:24 PM  Radiology: [facility] Chest 2 View Result Date: 04/03/2024 CLINICAL DATA:  Chest pain and shortness of breath. EXAM: CHEST - 2 VIEW COMPARISON:  Jan 28, 2024 FINDINGS: The heart size and mediastinal contours are within normal limits. Minimal left lingular subsegmental atelectasis is noted. Right lung is clear. The visualized skeletal structures are unremarkable. IMPRESSION: Minimal left lingular subsegmental atelectasis. Electronically Signed   By: Lynwood Landy Raddle M.D.   On: 04/03/2024 15:36     Procedures   Medications Ordered in the ED  alum & mag hydroxide-simeth (MAALOX/MYLANTA) 200-200-20 MG/5ML suspension 30 mL (30 mLs Oral Given 04/03/24 1534)    And  lidocaine  (XYLOCAINE ) 2 % viscous mouth solution 15 mL (15 mLs Oral Given 04/03/24 1534)                                    Medical Decision Making Amount and/or Complexity of Data Reviewed Labs: ordered. Radiology:  ordered.  Risk OTC drugs. Prescription drug management.   This patient is a 43 y.o. male  who presents to the ED for concern of chest pain, shob with exertion.   Differential diagnoses prior to evaluation: The emergent differential diagnosis includes, but is not limited to,  ACS, AAS, PE, Mallory-Weiss, Boerhaave's, Pneumonia, acute bronchitis, asthma or COPD exacerbation, anxiety,  MSK pain or traumatic injury to the chest, acid reflux versus other . This is not an exhaustive differential.   Past Medical History / Co-morbidities / Social History: HTN, OSA, GERD, obesity hypoventilation syndrome, CKD, LVH  Additional history: Chart reviewed. Pertinent results include: Reviewed echo, stress test from previous cardiology evaluations, previous lab work, imaging.  Physical Exam: Physical exam performed. The pertinent findings include: Vital signs overall stable in the emergency department, initially with some mild tachypnea, respirations 24, normotensive blood pressure throughout exam, stable oxygen saturation on room air.  No respiratory distress noted.  Lab Tests/Imaging studies: I personally interpreted labs/imaging and the pertinent results include:  CBC unremarkable, BMP with elevated creatinine at 1.63, fairly stable to slightly improved compared to baseline.  Troponin x 2 negative, BNP unremarkable, plain film chest x-ray independently interpreted by myself shows some left lingular atelectasis, no evidence of acute infiltrate, fluid overload or other pathology.  Cardiac monitoring: EKG obtained and interpreted by myself and attending physician which shows: NSR   Medications: I ordered medication including Gi cocktail -- symptoms somewhat improved on recheck.  I have reviewed the patients home medicines and have made adjustments as needed.   Disposition: After consideration of the diagnostic results and the patients response to treatment, I feel that patient does have obesity, tobacco  use, left-ventricular hypertrophy, hypertension who is having some shortness of breath with exertion, fairly high risk for CAD, but with negative stress test from 1 year ago, overall normal echo, and cardiology follow up in the morning, given reassuring workup today despite his risk factors I do think reasonable for him to follow-up with cardiologist in the morning.  emergency department workup does not suggest an emergent condition requiring admission or immediate intervention beyond what has been performed at this time. The plan is: as above. The patient is safe for discharge and has been instructed to return immediately for worsening symptoms, change in symptoms or any other concerns.   Final diagnoses:  Chest pain, unspecified type    ED Discharge Orders     None          Rosan Sherlean VEAR DEVONNA 04/03/24 1756    Tegeler, Lonni PARAS, MD 04/04/24 [id number]

## 2024-04-03 NOTE — ED Triage Notes (Signed)
 Pt reports gradual onset of centralized CP starting 3 days ago. Pain has remained constant and dull/achy. Pt reports associated SOB with exertion. Pt talking in complete sentences. Breathing even and unlabored. Denies dizziness, weakness, fatigue, fever. NAD noted in triage. Hx of coronary artery disease.

## 2024-04-03 NOTE — Discharge Instructions (Signed)
 Please follow-up closely with your cardiologist as you have planned in the morning, your workup in the emergency department today was reassuring, but if you have worsening chest pain, shortness of breath I recommend that you return for further evaluation.  Continue taking your home omeprazole to help with acid reflux.

## 2024-04-04 ENCOUNTER — Encounter: Payer: Self-pay | Admitting: Cardiology

## 2024-04-04 ENCOUNTER — Ambulatory Visit: Admitting: Orthopaedic Surgery

## 2024-04-04 ENCOUNTER — Ambulatory Visit: Attending: Cardiology | Admitting: Cardiology

## 2024-04-04 VITALS — BP 147/87 | HR 71 | Ht 73.0 in | Wt 397.4 lb

## 2024-04-04 DIAGNOSIS — E782 Mixed hyperlipidemia: Secondary | ICD-10-CM

## 2024-04-04 DIAGNOSIS — R0602 Shortness of breath: Secondary | ICD-10-CM

## 2024-04-04 DIAGNOSIS — R079 Chest pain, unspecified: Secondary | ICD-10-CM

## 2024-04-04 DIAGNOSIS — F17200 Nicotine dependence, unspecified, uncomplicated: Secondary | ICD-10-CM

## 2024-04-04 DIAGNOSIS — N1832 Chronic kidney disease, stage 3b: Secondary | ICD-10-CM

## 2024-04-04 DIAGNOSIS — I10 Essential (primary) hypertension: Secondary | ICD-10-CM

## 2024-04-04 DIAGNOSIS — I5032 Chronic diastolic (congestive) heart failure: Secondary | ICD-10-CM

## 2024-04-04 DIAGNOSIS — G4733 Obstructive sleep apnea (adult) (pediatric): Secondary | ICD-10-CM

## 2024-04-04 MED ORDER — DAPAGLIFLOZIN PROPANEDIOL 10 MG PO TABS: 10.0000 mg | ORAL_TABLET | Freq: Every day | ORAL | 3 refills | Status: DC

## 2024-04-04 MED ORDER — DAPAGLIFLOZIN PROPANEDIOL 10 MG PO TABS: 10.0000 mg | ORAL_TABLET | Freq: Every day | ORAL | Status: DC

## 2024-04-04 NOTE — Progress Notes (Signed)
 Cardiology Office Note   Date:  04/04/2024  ID:  [name], DOB Nov 21, 1980, MRN [id number] PCP: de [place], Quintin PARAS, MD  [facility] Providers Cardiologist:  Dub Huntsman, DO     History of Present Illness Calvin Bailey is a 43 y.o. male with a past medical history of hypertension, hyperlipidemia, OSA, CKD, morbid obesity, tobacco abuse, chronic HFpEF, CKD stage IIIa, chronic chest pain, who is here today for follow-up.   Diagnosed with hypertension in his 12s.  Has been difficult to control previous difficulties with compliance.  01/2022 CT angio of the head and neck with no significant carotid or vertebral stenosis.  Previously been followed with hypertensive clinic as well as seen the Pharm.D. team.  Presented to [facility] in February 2024 with complaints of chest pain and hypertensive urgency.  Was feeling persistent chest pain in the right side of his chest while he was at work.  Chest pain had been related to hypertensive urgency.  Had chest pain 11/13/2022 with no PE on CT of the chest.  Blood pressure was 172/29.  Serum creatinine 1.61 with BUN of 29 and GFR 54, high-sensitivity troponin 12, EKG without ischemic changes.  Started on Imdur  and echocardiogram was ordered.  And revealed an LVEF of 60-65%, normal LV function, no RWMA, mild concentric LVH, normal RV systolic function, mild aortic valve regurgitation, no significant change from prior study.  It was recommended he have a coronary CTA as an outpatient.  Evaluated in urgent care 01/25/23 with complaints of a headache for 2 days.  Concerns if it was in the right temple area was swollen.  He had surgery upcoming soon for renal mass and is worried about a blood clot or cancer that had spread.  Blood pressure was much improved to 147/83.  He was treated with Tylenol  for his headache.  He was subsequently discharged.  He was scheduled for Lexiscan  Myoview  to complete his ischemic workup from previous hospitalization.   Coronary CTA was deferred due to elevated serum creatinine.  Lexiscan  Myoview  was considered low risk, negative for ischemia or infarction, EF 55 to 65%.  He was last seen in clinic 12/20/2023 at that time he was planning on participating in a digital smoking cessation program  He was evaluated in the ED on 12/19/2023 in the setting of shortness of breath.  He was noted to have mild bilateral lower extremity edema.  BNP was normal.  EKG was without acute changes, troponin was negative, D-dimer was negative.  He was treated with nebulizer and Solu-Medrol  with improvement in his symptoms and was discharged home in stable condition.  There were no medication changes that were made or further testing that was ordered at that time.   He has been evaluated in the emergency department 3 times since his last visit in clinic.  Last emergency department visit was 04/03/24 where he had complaints of chest pain.  Pain had remained constant for approximately 3 days with associated shortness of breath on exertion.  The chest pain was centralized.  He said it would occasionally radiate down his right arm and wondered if that was related to pinched nerves in his neck.  He states that the pain was dull.  Vital signs revealed a blood pressure 132/68, pulse of 86, respirations of 16, temperature 98.4.  Serum creatinine 1.63, GFR 53, blood sugar 128, high-sensitivity troponin negative x 2, BNP less than 36.  Remainder of the workup was unrevealing and he was discharged home.  He returns to clinic today with continued shortness of breath, weight gain, dyspnea on exertion, peripheral edema.  He states he has followed up with pulmonary and nephrology has been advised that his issue is a cardiac issue.  He was recently evaluated in the emergency department yesterday with complaints of chest pain with negative troponins and a normal BNP.  Question BNP due to his size whether it is a negative test.  He states that he has had several  medication changes where he was restarted on chlorthalidone  by his nephrologist.  He has no longer taking furosemide  and his hydralazine  was decreased.  Overall his kidney function has improved.  He states that he has been compliant with his current medication regimen without undue side effects.  Has noted an uptick in his blood pressure with weight gain.  ROS: 10 point review of systems has been reviewed and considered negative exception was been listed in the HPI  Studies Reviewed     Lexiscan  MPI 01/17/2023   The study is normal. The study is low risk.   No ST deviation was noted.   LV perfusion is normal. There is no evidence of ischemia. There is no evidence of infarction.   Left ventricular function is normal. The left ventricular ejection fraction is normal (55-65%). End diastolic cavity size is normal.   This was supposed to be a 2-day study.  However, stress images are normal and does not need for rest imaging   TTE 11/20/22 1. Left ventricular ejection fraction, by estimation, is 60 to 65%. The  left ventricle has normal function. The left ventricle has no regional  wall motion abnormalities. There is mild concentric left ventricular  hypertrophy. Left ventricular diastolic  parameters were normal.   2. Right ventricular systolic function is normal. The right ventricular  size is normal. There is normal pulmonary artery systolic pressure.   3. The mitral valve is normal in structure. Trivial mitral valve  regurgitation. No evidence of mitral stenosis.   4. The aortic valve is tricuspid. Aortic valve regurgitation is mild. No  aortic stenosis is present.   5. The inferior vena cava is normal in size with greater than 50%  respiratory variability, suggesting right atrial pressure of 3 mmHg.    Risk Assessment/Calculations   HYPERTENSION CONTROL Vitals:   04/04/24 1545  BP: (!) 147/87    The patient's blood pressure is elevated above target today.  In order to address the  patient's elevated BP:           Physical Exam VS:  BP (!) 147/87   Pulse 71   Ht 6' 1 (1.854 m)   Wt (!) 397 lb 6.4 oz (180.3 kg)   SpO2 94%   BMI 52.43 kg/m        Wt Readings from Last 3 Encounters:  04/04/24 (!) 397 lb 6.4 oz (180.3 kg)  04/03/24 (!) 380 lb (172.4 kg)  02/24/24 (!) 404 lb 9.6 oz (183.5 kg)    GEN: Well nourished, well developed in no acute distress NECK: Unable to determine JVD due to body habitus; No carotid bruits CARDIAC: RRR, no murmurs, rubs, gallops RESPIRATORY:  Clear with diminished bases to auscultation without rales, wheezing or rhonchi  ABDOMEN: Soft, non-tender, obese, non-distended EXTREMITIES:  1+ edema; No deformity   ASSESSMENT AND PLAN Atypical chest pain with associated shortness of breath patient was recently evaluated in the emergency department negative troponins and negative BNP.  Chest pain has decreased but the shortness of  breath continues to progress.  He also has weight gain in peripheral edema.  His nephrologist recently had taken him off of furosemide  and placed him on chlorthalidone .  He has been scheduled for an updated echocardiogram for progressive shortness of breath and for his chest discomfort where he has had several visits to the emergency department he is being scheduled for an additional Lexiscan  Myoview  to rule out any ischemic causes.  He has concerns today stating that he has been told by his pulmonologist and his nephrologist that his symptoms are likely cardiac related.  He was advised today that testing will allow us  to rule in or out ischemic causes of his symptoms.  Chronic HFpEF with worsening shortness of breath, weight gain, peripheral edema.  Patient is on spironolactone  1225 mg daily and chlorthalidone  12.5 mg daily valsartan  160 mg twice daily no longer on diuretic therapy.  Difficult to determine fluid status due to body habitus.  Would likely benefit from SGLT2 inhibitor of Farxiga  10 mg daily with starting  medication today with a follow-up BMP in 2 weeks.  Will also reach out to nephrology to ensure they okay with him starting medication as the patient states nephrology is questioning his current medication regimen.  He is also being scheduled for an updated echocardiogram to ensure there is not been a change in heart function or wall motion abnormalities.  Hypertension with a blood pressure 147/87 today.  Slightly elevated.  He has been continued on amlodipine  10 mg daily, Imdur  30 mg daily, labetalol  300 mg twice daily, spironolactone  25 mg daily valsartan  160 mg twice daily and chlorthalidone  12.5 mg daily.  Patient states hydralazine  dosing was decreased by nephrology.  He has been encouraged to continue to monitor his pressure 1 to 2 hours postmedication administration as well.  CKD stage IIIb with serum creatinine of 1.63.  Slight improvement in kidney function with improvement in blood pressure.  Ongoing management per nephrology.  OSA restates he has been adherent to CPAP therapy.  Super obesity with a BMI of 52.43 which makes prognosis and treatment difficult.  He has pending possible bariatric surgery.  Tobacco abuse with total cessation recommended    Informed Consent   Shared Decision Making/Informed Consent The risks [chest pain, shortness of breath, cardiac arrhythmias, dizziness, blood pressure fluctuations, myocardial infarction, stroke/transient ischemic attack, nausea, vomiting, allergic reaction, radiation exposure, metallic taste sensation and life-threatening complications (estimated to be 1 in 10,000)], benefits (risk stratification, diagnosing coronary artery disease, treatment guidance) and alternatives of a nuclear stress test were discussed in detail with Mr. Cowie and he agrees to proceed.     Dispo: Patient to return to clinic to see MD/APP once testing is completed or sooner if needed for further evaluation  Signed, Timea Breed, NP

## 2024-04-04 NOTE — Patient Instructions (Signed)
 Medication Instructions:  Your physician recommends the following medication changes.  START TAKING: Farxiga  10 mg daily  *If you need a refill on your cardiac medications before your next appointment, please call your pharmacy*  Lab Work: Your provider would like for you to return in 2 weeks to have the following labs drawn: BMP.   Please go to [facility] [address] ([facility]) [address], [place] [address] You do not need an appointment.  They are open from 8 am- 4:30 pm.  Lunch from 1:00 pm- 2:00 pm You DO NOT need to be fasting.   You may also go to one of the following [facility]:  [address] [place], [place] [address] Phone: [phone] Lab hours: Mon-Fri 8 am- 5 pm    Lunch 12 pm- 1 pm  [address] [place],  [place]  [address]  [place]  Phone: [phone] Lab hours: 7 am- 4 pm Lunch 12 pm-1 pm   [address] [place],  [place]  [address]  [place]  Phone: [phone] Lab hours: Mon-Fri 8 am- 5 pm    Lunch 12 pm- 1 pm  If you have labs (blood work) drawn today and your tests are completely normal, you will receive your results only by: MyChart Message (if you have MyChart) OR A paper copy in the mail If you have any lab test that is abnormal or we need to change your treatment, we will call you to review the results.  Testing/Procedures: Your provider has ordered a Lexiscan / Exercise Myoview  Stress test. This will take place at [facility]. Please report to the [facility] medical mall entrance. The volunteers at the first desk will direct you where to go.  [facility] MYOVIEW   Your provider has ordered a Stress Test with nuclear imaging. The purpose of this test is to evaluate the blood supply to your heart muscle. This procedure is referred to as a Non-Invasive Stress Test. This is because other than having an IV started in your vein, nothing is inserted or invades your body. Cardiac stress tests are done to find areas of poor blood flow to the heart by determining  the extent of coronary artery disease (CAD). Some patients exercise on a treadmill, which naturally increases the blood flow to your heart, while others who are unable to walk on a treadmill due to physical limitations will have a pharmacologic/chemical stress agent called Lexiscan  . This medicine will mimic walking on a treadmill by temporarily increasing your coronary blood flow.   How to prepare for your Myoview  test:  Nothing to eat for 6 hours prior to the test No caffeine for 24 hours prior to test No smoking 24 hours prior to test. Your medication may be taken with water.  If your doctor stopped a medication because of this test, do not take that medication. Ladies, please do not wear dresses.  Skirts or pants are appropriate. Please wear a short sleeve shirt. No perfume, cologne or lotion. Wear comfortable walking shoes. No heels!   PLEASE NOTIFY THE OFFICE AT LEAST 24 HOURS IN ADVANCE IF YOU ARE UNABLE TO KEEP YOUR APPOINTMENT.  [phone] AND  PLEASE NOTIFY NUCLEAR MEDICINE AT [facility] AT LEAST 24 HOURS IN ADVANCE IF YOU ARE UNABLE TO KEEP YOUR APPOINTMENT. [phone]   Your physician has requested that you have an echocardiogram. Echocardiography is a painless test that uses sound waves to create images of your heart. It provides your doctor with information about the size and shape of your heart and how well  your heart's chambers and valves are working.   You may receive an ultrasound enhancing agent through an IV if needed to better visualize your heart during the echo. This procedure takes approximately one hour.  There are no restrictions for this procedure.  This will take place at [address] [facility]) [address], [place] [address]  Please note: We ask at that you not bring children with you during ultrasound (echo/ vascular) testing. Due to room size and safety concerns, children are not allowed in the ultrasound rooms during exams. Our front office staff  cannot provide observation of children in our lobby area while testing is being conducted. An adult accompanying a patient to their appointment will only be allowed in the ultrasound room at the discretion of the ultrasound technician under special circumstances. We apologize for any inconvenience.   Follow-Up: At [facility], you and your health needs are our priority.  As part of our continuing mission to provide you with exceptional heart care, our providers are all part of one team.  This team includes your primary Cardiologist (physician) and Advanced Practice Providers or APPs (Physician Assistants and Nurse Practitioners) who all work together to provide you with the care you need, when you need it.  Your next appointment:   6 week(s)  Provider:   Tylene Lunch, NP

## 2024-04-05 ENCOUNTER — Telehealth: Payer: Self-pay | Admitting: Pharmacy Technician

## 2024-04-05 ENCOUNTER — Other Ambulatory Visit (HOSPITAL_COMMUNITY): Payer: Self-pay

## 2024-04-05 MED ORDER — EMPAGLIFLOZIN 10 MG PO TABS
10.0000 mg | ORAL_TABLET | Freq: Every day | ORAL | 11 refills | Status: AC
  Filled 2024-04-05 – 2024-05-11 (×3): qty 30, 30d supply, fill #0
  Filled 2024-06-09: qty 30, 30d supply, fill #1
  Filled 2024-07-13: qty 30, 30d supply, fill #0
  Filled 2024-08-16: qty 30, 30d supply, fill #1
  Filled 2024-09-17: qty 30, 30d supply, fill #2
  Filled 2024-10-19: qty 30, 30d supply, fill #3

## 2024-04-05 NOTE — Addendum Note (Signed)
 Addended by: DASIE SHARLET RAMAN on: 04/05/2024 10:51 AM   Modules accepted: Orders

## 2024-04-05 NOTE — Telephone Encounter (Signed)
 Spoke with patient and reviewed that Farxiga  was denied so we sent in Jardiance  which will be covered by his insurance. Advised to not take both. He verbalized understanding with no further questions at this time.

## 2024-04-05 NOTE — Telephone Encounter (Signed)
 Ran test claim for JARDIANCE . For a 30 day supply and the co-pay is 0.00 . PA is not needed at this time. Nothing saying this is a transition fill.   This test claim was processed through [facility]- copay amounts may vary at other pharmacies due to pharmacy/plan contracts, or as the patient moves through the different stages of their insurance plan.

## 2024-04-05 NOTE — Addendum Note (Signed)
 Addended by: DASIE SHARLET RAMAN on: 04/05/2024 12:27 PM   Modules accepted: Orders

## 2024-04-05 NOTE — Telephone Encounter (Signed)
 Pharmacy Patient Advocate Encounter  Received notification from [employer] that Prior Authorization for FARXIGA  has been DENIED.  See denial reason below. No denial letter attached in CMM. Will attach denial letter to Media tab once received.  INSURANCE WANTS JARDIANCE . I RAN JARDIANCE  AND JARDIANCE  GOES THROUGH [employer] AND COPAY IS FREE. CAN THE PATIENT BE CHANGED TO JARDIANCE ?

## 2024-04-05 NOTE — Telephone Encounter (Signed)
 Pharmacy Patient Advocate Encounter   Received notification from Reavis, Angela D, RN that prior authorization for farxiga  10mg  is required/requested.   Insurance verification completed.   The patient is insured through [employer] .   Per test claim: PA required; PA submitted to above mentioned insurance via latent Key/confirmation #/EOC [id number] Status is pending       Brand and generic rejected saying plan/benefit exclusion. Alt options: jardiance 

## 2024-04-06 ENCOUNTER — Ambulatory Visit (HOSPITAL_BASED_OUTPATIENT_CLINIC_OR_DEPARTMENT_OTHER): Admitting: Family Medicine

## 2024-04-12 ENCOUNTER — Ambulatory Visit: Admitting: Internal Medicine

## 2024-04-16 ENCOUNTER — Other Ambulatory Visit (HOSPITAL_COMMUNITY): Payer: Self-pay

## 2024-04-17 ENCOUNTER — Telehealth: Payer: Self-pay | Admitting: Cardiology

## 2024-04-17 NOTE — Telephone Encounter (Signed)
Left voicemail to schedule echo appt

## 2024-04-19 ENCOUNTER — Ambulatory Visit (HOSPITAL_BASED_OUTPATIENT_CLINIC_OR_DEPARTMENT_OTHER)
Admission: RE | Admit: 2024-04-19 | Discharge: 2024-04-19 | Disposition: A | Discharge status: Home / Self Care | Source: Ambulatory Visit | Attending: Family Medicine | Admitting: Family Medicine

## 2024-04-19 DIAGNOSIS — C642 Malignant neoplasm of left kidney, except renal pelvis: Secondary | ICD-10-CM | POA: Diagnosis present

## 2024-04-19 DIAGNOSIS — R0602 Shortness of breath: Secondary | ICD-10-CM | POA: Diagnosis present

## 2024-04-19 NOTE — Telephone Encounter (Signed)
 Scheduled appt in [place] for 05/24/24

## 2024-04-27 ENCOUNTER — Encounter (HOSPITAL_BASED_OUTPATIENT_CLINIC_OR_DEPARTMENT_OTHER): Payer: Self-pay

## 2024-04-27 ENCOUNTER — Other Ambulatory Visit: Payer: Self-pay

## 2024-04-27 ENCOUNTER — Emergency Department (HOSPITAL_BASED_OUTPATIENT_CLINIC_OR_DEPARTMENT_OTHER): Admitting: Radiology

## 2024-04-27 ENCOUNTER — Emergency Department (HOSPITAL_BASED_OUTPATIENT_CLINIC_OR_DEPARTMENT_OTHER)
Admission: EM | Admit: 2024-04-27 | Discharge: 2024-04-27 | Disposition: A | Discharge status: Home / Self Care | Attending: Emergency Medicine | Admitting: Emergency Medicine

## 2024-04-27 DIAGNOSIS — R0602 Shortness of breath: Secondary | ICD-10-CM | POA: Insufficient documentation

## 2024-04-27 DIAGNOSIS — R0789 Other chest pain: Secondary | ICD-10-CM | POA: Diagnosis not present

## 2024-04-27 DIAGNOSIS — Z79899 Other long term (current) drug therapy: Secondary | ICD-10-CM | POA: Insufficient documentation

## 2024-04-27 DIAGNOSIS — R600 Localized edema: Secondary | ICD-10-CM | POA: Diagnosis not present

## 2024-04-27 DIAGNOSIS — I129 Hypertensive chronic kidney disease with stage 1 through stage 4 chronic kidney disease, or unspecified chronic kidney disease: Secondary | ICD-10-CM | POA: Insufficient documentation

## 2024-04-27 DIAGNOSIS — N189 Chronic kidney disease, unspecified: Secondary | ICD-10-CM | POA: Diagnosis not present

## 2024-04-27 LAB — PRO BRAIN NATRIURETIC PEPTIDE: Pro Brain Natriuretic Peptide: 157 pg/mL (ref <300.0)

## 2024-04-27 LAB — CBC
HCT: 42 % (ref 39.0–52.0)
Hemoglobin: 13.9 g/dL (ref 13.0–17.0)
MCH: 29 pg (ref 26.0–34.0)
MCHC: 33.1 g/dL (ref 30.0–36.0)
MCV: 87.7 fL (ref 80.0–100.0)
Platelets: 224 K/uL (ref 150–400)
RBC: 4.79 MIL/uL (ref 4.22–5.81)
RDW: 13 % (ref 11.5–15.5)
WBC: 7 K/uL (ref 4.0–10.5)
nRBC: 0 % (ref 0.0–0.2)

## 2024-04-27 LAB — TROPONIN T, HIGH SENSITIVITY
Troponin T High Sensitivity: 18 ng/L (ref <19)
Troponin T High Sensitivity: 16 ng/L (ref <19)

## 2024-04-27 LAB — RESP PANEL BY RT-PCR (RSV, FLU A&B, COVID)  RVPGX2
Influenza A by PCR: NEGATIVE
Influenza B by PCR: NEGATIVE
Resp Syncytial Virus by PCR: NEGATIVE
SARS Coronavirus 2 by RT PCR: NEGATIVE

## 2024-04-27 LAB — BASIC METABOLIC PANEL WITH GFR
Anion gap: 13 (ref 5–15)
BUN: 18 mg/dL (ref 6–20)
CO2: 20 mmol/L — ABNORMAL LOW (ref 22–32)
Calcium: 9.4 mg/dL (ref 8.9–10.3)
Chloride: 107 mmol/L (ref 98–111)
Creatinine, Ser: 1.74 mg/dL — ABNORMAL HIGH (ref 0.61–1.24)
GFR, Estimated: 49 mL/min — ABNORMAL LOW (ref >60)
Glucose, Bld: 115 mg/dL — ABNORMAL HIGH (ref 70–99)
Potassium: 4.4 mmol/L (ref 3.5–5.1)
Sodium: 139 mmol/L (ref 135–145)

## 2024-04-27 MED ORDER — FUROSEMIDE 20 MG PO TABS
20.0000 mg | ORAL_TABLET | Freq: Once | ORAL | Status: DC
  Filled 2024-04-27: qty 1

## 2024-04-27 MED ORDER — FUROSEMIDE 10 MG/ML IJ SOLN: 20.0000 mg | Freq: Once | INTRAMUSCULAR | Status: DC

## 2024-04-27 MED ORDER — ALBUTEROL SULFATE (2.5 MG/3ML) 0.083% IN NEBU
2.5000 mg | INHALATION_SOLUTION | Freq: Once | RESPIRATORY_TRACT | Status: AC
  Administered 2024-04-27: 2.5 mg via RESPIRATORY_TRACT
  Filled 2024-04-27: qty 3

## 2024-04-27 MED ORDER — FUROSEMIDE 20 MG PO TABS
20.0000 mg | ORAL_TABLET | Freq: Once | ORAL | Status: AC
  Administered 2024-04-27: 20 mg via ORAL

## 2024-04-27 MED ORDER — FUROSEMIDE 20 MG PO TABS: 20.0000 mg | ORAL_TABLET | Freq: Every day | ORAL | 0 refills | Status: DC

## 2024-04-27 NOTE — ED Triage Notes (Signed)
 Pt c/o SHOB x 60mo, tightness in chest. Denies issues taking home meds, reports haven't taken them today yet.

## 2024-04-27 NOTE — ED Provider Notes (Signed)
 [place] EMERGENCY DEPARTMENT AT [facility] Provider Note   CSN: [id number] Arrival date & time: 04/27/24  1349     Patient presents with: Shortness of Breath   Calvin Bailey is a 43 y.o. male.   43 year old male presents to ED with past medical history significant for HTN, OSA, GERD, obesity hypoventilation syndrome, CKD, LVH, who presents with shortness of breath and chest tightness for 1 month.  Patient reports she has been seen at the ED earlier in July for same.  Patient reports shortness of breath only occurs when walking, and standing for long period time.  Patient reports quitting smoking 1 month ago and has been smoking for 20 years.  Patient denies any recent travel, illness, unilateral leg swelling, or major changes in activity.  Patient reports occasional productive cough with clear phlegm.  Patient reports nebulized albuterol  has been helpful in the past, he has at home inhaler that provides no relief.  Patient had an out patient CT scan done at [facility] yesterday with results still pending.  And has a COPD and CHF workup with respiratory in a few weeks.  Patient also reports abdominal pain for the past month and changes in bowel frequency/consistency.    Shortness of Breath Associated symptoms: abdominal pain and chest pain   Associated symptoms: no diaphoresis, no ear pain, no fever, no headaches and no vomiting        Prior to Admission medications   Medication Sig Start Date End Date Taking? Authorizing Provider  furosemide  (LASIX ) 20 MG tablet Take 1 tablet (20 mg total) by mouth daily. 04/27/24  Yes Myriam Fonda RAMAN, PA-C  tiZANidine (ZANAFLEX) 4 MG tablet Take 4 mg by mouth every 8 (eight) hours as needed. 04/05/24  Yes [provider]  acetaminophen  (TYLENOL ) 500 MG tablet Take 1 tablet (500 mg total) by mouth every 6 (six) hours as needed. 05/20/23   Horton, Charmaine FALCON, MD  albuterol  (VENTOLIN  HFA) 108 (90 Base) MCG/ACT inhaler Inhale into the  lungs. 06/02/17   [provider]  allopurinol  (ZYLOPRIM ) 100 MG tablet TAKE 1 TABLET(100 MG) BY MOUTH DAILY 03/10/23   de [place], Quintin PARAS, MD  amLODipine  (NORVASC ) 10 MG tablet TAKE 1 TABLET(10 MG) BY MOUTH DAILY 12/23/22   Tobb, Kardie, DO  chlorthalidone  (HYGROTON ) 25 MG tablet Take 12.5 mg by mouth daily. 02/16/24   [provider]  colchicine  0.6 MG tablet Take 1 tablet (0.6 mg total) by mouth 2 (two) times daily. 03/10/23   [place], Quintin PARAS, MD  cyclobenzaprine  (FLEXERIL ) 10 MG tablet Take 1 tablet (10 mg total) by mouth 2 (two) times daily as needed for muscle spasms. 02/18/24   Desiderio Fonda, PA-C  empagliflozin  (JARDIANCE ) 10 MG TABS tablet Take 1 tablet (10 mg total) by mouth daily before breakfast. 04/05/24   Gerard Frederick, NP  hydrALAZINE  (APRESOLINE ) 100 MG tablet Take 1 tablet (100 mg total) by mouth 3 (three) times daily. 12/02/23   Tobb, Kardie, DO  ibuprofen  (ADVIL ) 200 MG tablet Take 600 mg by mouth.    [provider]  isosorbide  mononitrate (IMDUR ) 30 MG 24 hr tablet TAKE 1 TABLET(30 MG) BY MOUTH DAILY 06/10/23   Tobb, Kardie, DO  isosorbide  mononitrate (IMDUR ) 30 MG 24 hr tablet Take 1 tablet (30 mg total) by mouth daily. 12/02/23   Tobb, Kardie, DO  labetalol  (NORMODYNE ) 300 MG tablet TAKE 1 TABLET(300 MG) BY MOUTH TWICE DAILY 09/12/23   Tobb, Kardie, DO  predniSONE  (DELTASONE ) 20 MG tablet  Take 3 tablets (60mg  total) by mouth for 3 days, then take 2 tablets (40mg  total) for 3 days, then take 1 tablet (20mg ) for 3 days, then take HALF tablet (10mg ) for 3 days. 02/18/24   Geiple, Aizen Duval, PA-C  spironolactone  (ALDACTONE ) 25 MG tablet TAKE 1 TABLET(25 MG) BY MOUTH DAILY 06/24/23   Tobb, Kardie, DO  valsartan  (DIOVAN ) 160 MG tablet TAKE 1 TABLET(160 MG) BY MOUTH TWICE DAILY 07/26/23   Tobb, Kardie, DO  LISINOPRIL -HYDROCHLOROTHIAZIDE  PO Take by mouth.  07/06/19  [provider]    Allergies: Hydrochlorothiazide , Chlorthalidone , and Lisinopril     Review of  Systems  Constitutional:  Negative for activity change, appetite change, chills, diaphoresis, fatigue and fever.  HENT:  Negative for congestion, ear discharge and ear pain.   Eyes:  Negative for discharge and visual disturbance.  Respiratory:  Positive for shortness of breath.   Cardiovascular:  Positive for chest pain and leg swelling. Negative for palpitations.  Gastrointestinal:  Positive for abdominal pain, constipation and diarrhea. Negative for abdominal distention, blood in stool, nausea, rectal pain and vomiting.  Genitourinary:  Negative for dysuria and flank pain.  Musculoskeletal:  Negative for back pain and gait problem.  Neurological:  Negative for dizziness, light-headedness and headaches.    Updated Vital Signs BP (!) 142/74 (BP Location: Right Arm)   Pulse 70   Temp 98.7 F (37.1 C) (Oral)   Resp 18   SpO2 99%   Physical Exam Constitutional:      Appearance: He is obese.  HENT:     Head: Normocephalic and atraumatic.     Mouth/Throat:     Mouth: Mucous membranes are moist.     Pharynx: Oropharynx is clear.  Eyes:     Extraocular Movements: Extraocular movements intact.     Pupils: Pupils are equal, round, and reactive to light.  Cardiovascular:     Rate and Rhythm: Normal rate.  Pulmonary:     Effort: Pulmonary effort is normal. No tachypnea or respiratory distress.     Breath sounds: No decreased breath sounds, wheezing, rhonchi or rales.  Musculoskeletal:     Cervical back: Normal range of motion.     Right lower leg: No tenderness. Edema present.     Left lower leg: No tenderness. Edema present.  Skin:    General: Skin is warm and dry.     Capillary Refill: Capillary refill takes less than 2 seconds.  Neurological:     Mental Status: He is alert.     (all labs ordered are listed, but only abnormal results are displayed) Labs Reviewed  BASIC METABOLIC PANEL WITH GFR - Abnormal; Notable for the following components:      Result Value   CO2 20 (*)     Glucose, Bld 115 (*)    Creatinine, Ser 1.74 (*)    GFR, Estimated 49 (*)    All other components within normal limits  RESP PANEL BY RT-PCR (RSV, FLU A&B, COVID)  RVPGX2  CBC  PRO BRAIN NATRIURETIC PEPTIDE  TROPONIN T, HIGH SENSITIVITY  TROPONIN T, HIGH SENSITIVITY    EKG: None  Radiology: DG Chest 2 View Result Date: 04/27/2024 CLINICAL DATA:  Shortness of breath EXAM: CHEST - 2 VIEW COMPARISON:  Chest radiograph April 03, 2024, chest CT April 19, 2024 FINDINGS: The heart size and mediastinal contours are within normal limits. Linear scarring in left upper lobe. No new consolidation or nodule. Mild bilateral vascular congestion, increased to prior. The visualized skeletal structures are unremarkable.  IMPRESSION: Bilateral mild vascular congestion. No new pulmonary consolidation or nodule. Electronically Signed   By: Megan  Zare M.D.   On: 04/27/2024 14:47     Procedures   Medications Ordered in the ED  albuterol  (PROVENTIL ) (2.5 MG/3ML) 0.083% nebulizer solution 2.5 mg (2.5 mg Nebulization Given 04/27/24 1605)  furosemide  (LASIX ) tablet 20 mg (20 mg Oral Given 04/27/24 1755)                                   Medical Decision Making 43 year old male presents to the ED with complaints of shortness of breath chest tightness x 1 month.  Patient is sitting on ED bed in no obvious distress with normal breathing pattern.  Patient denies any family medical history of PE, recent travel, recent illness, or decreased activity.  Patient PERC negative, Wells score 0.  Patient has bilateral lower extremity edema that is chronic.  Patient reports he used to be on Lasix  but has D/C.  Patient reports he has not been on Lasix  in several months.  Patient has no tenderness in abdomen.  After albuterol  treatment patient reports chest tightness has eased.  Lungs are clear to auscultation in all fields.  After further discussion with patient he agreed to start Lasix  due to bilateral edema and mild vascular  congestion on chest x-ray.  Patient has follow-up with pulmonology already scheduled for next week and was advised to follow-up with PCP.  Patient agreed with treatment plan and felt comfortable with discharge.  Amount and/or Complexity of Data Reviewed Labs: ordered. Radiology: ordered.  Risk Prescription drug management.    Final diagnoses:  Lower extremity edema    ED Discharge Orders          Ordered    furosemide  (LASIX ) 20 MG tablet  Daily        04/27/24 1743               Myriam Fonda GORMAN DEVONNA 04/27/24 2137    Doretha Folks, MD 04/27/24 2243

## 2024-05-02 ENCOUNTER — Ambulatory Visit: Admitting: Internal Medicine

## 2024-05-02 DIAGNOSIS — R0602 Shortness of breath: Secondary | ICD-10-CM

## 2024-05-02 LAB — PULMONARY FUNCTION TEST
DL/VA % pred: 124 %
DL/VA: 5.61 ml/min/mmHg/L
DLCO cor % pred: 107 %
DLCO cor: 35.68 ml/min/mmHg
DLCO unc % pred: 105 %
DLCO unc: 34.95 ml/min/mmHg
FEF 25-75 Post: 0.73 L/s
FEF 25-75 Pre: 2.45 L/s
FEF2575-%Change-Post: -70 %
FEF2575-%Pred-Post: 17 %
FEF2575-%Pred-Pre: 60 %
FEV1-%Change-Post: -19 %
FEV1-%Pred-Post: 56 %
FEV1-%Pred-Pre: 69 %
FEV1-Post: 2.52 L
FEV1-Pre: 3.13 L
FEV1FVC-%Change-Post: 0 %
FEV1FVC-%Pred-Pre: 94 %
FEV6-%Change-Post: -19 %
FEV6-%Pred-Post: 60 %
FEV6-%Pred-Pre: 74 %
FEV6-Post: 3.37 L
FEV6-Pre: 4.16 L
FEV6FVC-%Change-Post: 0 %
FEV6FVC-%Pred-Post: 102 %
FEV6FVC-%Pred-Pre: 101 %
FVC-%Change-Post: -19 %
FVC-%Pred-Post: 59 %
FVC-%Pred-Pre: 73 %
FVC-Post: 3.38 L
FVC-Pre: 4.2 L
Post FEV1/FVC ratio: 75 %
Post FEV6/FVC ratio: 100 %
Pre FEV1/FVC ratio: 74 %
Pre FEV6/FVC Ratio: 99 %
RV % pred: 86 %
RV: 1.79 L
TLC % pred: 82 %
TLC: 6.22 L

## 2024-05-02 NOTE — Patient Instructions (Signed)
 Full PFT performed today.

## 2024-05-02 NOTE — Progress Notes (Signed)
 Full PFT performed today.

## 2024-05-03 ENCOUNTER — Encounter
Admission: RE | Admit: 2024-05-03 | Discharge: 2024-05-03 | Disposition: A | Discharge status: Home / Self Care | Source: Ambulatory Visit | Attending: Cardiology | Admitting: Cardiology

## 2024-05-03 ENCOUNTER — Other Ambulatory Visit: Payer: Self-pay | Admitting: Cardiology

## 2024-05-03 DIAGNOSIS — R079 Chest pain, unspecified: Secondary | ICD-10-CM | POA: Insufficient documentation

## 2024-05-03 MED ORDER — TECHNETIUM TC 99M TETROFOSMIN IV KIT
30.0000 | PACK | Freq: Once | INTRAVENOUS | Status: AC | PRN
  Administered 2024-05-03: 31.02 via INTRAVENOUS

## 2024-05-03 NOTE — Progress Notes (Signed)
     Calvin Bailey presented for a Lexiscan  nuclear stress test today.  I Andreina Outten, NP, provided direct supervision and was present during the stress portion of the study today, which was completed 05/03/2024 without significant symptoms, immediate complications, or acute ST/T changes on ECG.  Stress imaging is pending at this time.  Preliminary ECG findings may be listed in the chart, but the stress test result will not be finalized until perfusion imaging is complete.  Jamila Slatten, NP  05/03/2024, 12:34 PM

## 2024-05-04 ENCOUNTER — Encounter
Admission: RE | Admit: 2024-05-04 | Discharge: 2024-05-04 | Disposition: A | Discharge status: Home / Self Care | Source: Ambulatory Visit | Attending: Cardiology | Admitting: Cardiology

## 2024-05-04 ENCOUNTER — Ambulatory Visit: Payer: Self-pay | Admitting: Cardiology

## 2024-05-04 LAB — NM MYOCAR MULTI W/SPECT W/WALL MOTION / EF
LV dias vol: 245 mL (ref 62–150)
LV sys vol: 107 mL (ref 4.2–5.8)
MPHR: 176 {beats}/min
Nuc Stress EF: 56 %
Peak HR: 99 {beats}/min
Percent HR: 56 %
Rest HR: 61 {beats}/min
Rest Nuclear Isotope Dose: 31.9 mCi
SDS: 10
SRS: 0
SSS: 10
Stress Nuclear Isotope Dose: 31 mCi
TID: 1.13

## 2024-05-04 MED ORDER — REGADENOSON 0.4 MG/5ML IV SOLN
0.4000 mg | Freq: Once | INTRAVENOUS | Status: AC
  Administered 2024-05-03: 0.4 mg via INTRAVENOUS

## 2024-05-04 MED ORDER — TECHNETIUM TC 99M TETROFOSMIN IV KIT
31.9300 | PACK | Freq: Once | INTRAVENOUS | Status: AC | PRN
  Administered 2024-05-04: 31.93 via INTRAVENOUS

## 2024-05-04 NOTE — Progress Notes (Signed)
 Small region of ischemia in the mid inferior lateral wall and mid to distal inferior and inferior apical wall.  It is considered a moderate risk scan.  Recommend moving follow-up appointment up to discuss next steps in further detail.

## 2024-05-05 ENCOUNTER — Other Ambulatory Visit: Payer: Self-pay | Admitting: Cardiology

## 2024-05-09 ENCOUNTER — Encounter: Payer: Self-pay | Admitting: Cardiology

## 2024-05-09 ENCOUNTER — Ambulatory Visit: Attending: Cardiology | Admitting: Cardiology

## 2024-05-09 VITALS — BP 174/104 | HR 68 | Ht 73.0 in | Wt 397.0 lb

## 2024-05-09 DIAGNOSIS — N1832 Chronic kidney disease, stage 3b: Secondary | ICD-10-CM

## 2024-05-09 DIAGNOSIS — I5032 Chronic diastolic (congestive) heart failure: Secondary | ICD-10-CM

## 2024-05-09 DIAGNOSIS — R072 Precordial pain: Secondary | ICD-10-CM

## 2024-05-09 DIAGNOSIS — F17200 Nicotine dependence, unspecified, uncomplicated: Secondary | ICD-10-CM

## 2024-05-09 DIAGNOSIS — I10 Essential (primary) hypertension: Secondary | ICD-10-CM

## 2024-05-09 DIAGNOSIS — E782 Mixed hyperlipidemia: Secondary | ICD-10-CM | POA: Diagnosis not present

## 2024-05-09 DIAGNOSIS — G4733 Obstructive sleep apnea (adult) (pediatric): Secondary | ICD-10-CM

## 2024-05-09 DIAGNOSIS — R931 Abnormal findings on diagnostic imaging of heart and coronary circulation: Secondary | ICD-10-CM

## 2024-05-09 DIAGNOSIS — Z79899 Other long term (current) drug therapy: Secondary | ICD-10-CM

## 2024-05-09 MED ORDER — AMLODIPINE BESYLATE 5 MG PO TABS: 5.0000 mg | ORAL_TABLET | Freq: Two times a day (BID) | ORAL | 3 refills | Status: DC

## 2024-05-09 NOTE — Progress Notes (Signed)
 Cardiology Office Note   Date:  05/09/2024  ID:  BAYARD MORE, DOB 22-May-1981, MRN [id number] PCP: de [place], Quintin PARAS, MD  [facility] Providers Cardiologist:  Dub Huntsman, DO     History of Present Illness QUINCEY QUESINBERRY is a 43 y.o. male with a past medical history of hypertension, hyperlipidemia, OSA, CKD, superobesity, tobacco abuse, chronic HFpEF, sleep chronic chest pain, who presents today for follow-up.   Diagnosed with hypertension in his 68s.  Has been difficult to control previous difficulties with compliance.  01/2022 CT angio of the head and neck with no significant carotid or vertebral stenosis.  Previously been followed with hypertensive clinic as well as seen the Pharm.D. team.  Presented to [facility] in February 2024 with complaints of chest pain and hypertensive urgency.  Was feeling persistent chest pain in the right side of his chest while he was at work.  Chest pain had been related to hypertensive urgency.  Had chest pain 11/13/2022 with no PE on CT of the chest.  Blood pressure was 172/29.  Serum creatinine 1.61 with BUN of 29 and GFR 54, high-sensitivity troponin 12, EKG without ischemic changes.  Started on Imdur  and echocardiogram was ordered.  And revealed an LVEF of 60-65%, normal LV function, no RWMA, mild concentric LVH, normal RV systolic function, mild aortic valve regurgitation, no significant change from prior study.  It was recommended he have a coronary CTA as an outpatient.  Evaluated in urgent care 01/25/23 with complaints of a headache for 2 days.  Concerns if it was in the right temple area was swollen.  He had surgery upcoming soon for renal mass and is worried about a blood clot or cancer that had spread.  Blood pressure was much improved to 147/83.  He was treated with Tylenol  for his headache.  He was subsequently discharged.  He was scheduled for Lexiscan  Myoview  to complete his ischemic workup from previous hospitalization.  Coronary  CTA was deferred due to elevated serum creatinine.  Lexiscan  Myoview  was considered low risk, negative for ischemia or infarction, EF 55 to 65%.  He was last seen in clinic 12/20/2023 at that time he was planning on participating in a digital smoking cessation program  He was evaluated in the ED on 12/19/2023 in the setting of shortness of breath.  He was noted to have mild bilateral lower extremity edema.  BNP was normal.  EKG was without acute changes, troponin was negative, D-dimer was negative.  He was treated with nebulizer and Solu-Medrol  with improvement in his symptoms and was discharged home in stable condition.  There were no medication changes that were made or further testing that was ordered at that time.  Multiple emergency department visits for varying complaints of chest and abdominal pain. Work-ups were unrevealing and he was discharged home.   He was last seen in clinic 04/04/2024 with continued shortness of breath, weight gain, dyspnea on exertion, peripheral edema.  He had followed up with pulmonary and nephrology but a positive history was a cardiac issue.  He had recently been evaluated in the emergency department the day before with complaints of chest pains with negative troponins.  He was scheduled for a 2-day Lexiscan  to rule out any ischemic causes.  Was evaluated in the emergency department at [facility] 04/27/2024 with complaints of shortness of breath abdominal pain and chest pain.  Been complaining of the shortness of breath and chest tightness for 1 month.  He was in the ED  bed with no obvious distress with normal breathing pattern.  Workup was unrevealing and he was discharged home with furosemide  20 mg daily.  He returns to clinic today    ROS: 10 point review of systems has been reviewed and considered negative exception was been listed in the HPI  Studies Reviewed     Lexiscan  MPI 05/04/2024 Pharmacological myocardial perfusion imaging study with small region of ischemia  in the basal to mid inferolateral wall and mid to distal inferior and inferoapical wall Unable to definitively exclude diaphragmatic attenuation artifact as attenuation correction images not available Normal wall motion, EF estimated at 56% No EKG changes concerning for ischemia at peak stress or in recovery. Moderate risk scan  Lexiscan  MPI 01/17/2023   The study is normal. The study is low risk.   No ST deviation was noted.   LV perfusion is normal. There is no evidence of ischemia. There is no evidence of infarction.   Left ventricular function is normal. The left ventricular ejection fraction is normal (55-65%). End diastolic cavity size is normal.   This was supposed to be a 2-day study.  However, stress images are normal and does not need for rest imaging   TTE 11/20/22 1. Left ventricular ejection fraction, by estimation, is 60 to 65%. The  left ventricle has normal function. The left ventricle has no regional  wall motion abnormalities. There is mild concentric left ventricular  hypertrophy. Left ventricular diastolic  parameters were normal.   2. Right ventricular systolic function is normal. The right ventricular  size is normal. There is normal pulmonary artery systolic pressure.   3. The mitral valve is normal in structure. Trivial mitral valve  regurgitation. No evidence of mitral stenosis.   4. The aortic valve is tricuspid. Aortic valve regurgitation is mild. No  aortic stenosis is present.   5. The inferior vena cava is normal in size with greater than 50%  respiratory variability, suggesting right atrial pressure of 3 mmHg.    Risk Assessment/Calculations        Physical Exam VS:  BP (!) 174/104   Pulse 68   Ht 6' 1 (1.854 m)   Wt (!) 397 lb (180.1 kg)   SpO2 97%   BMI 52.38 kg/m        Wt Readings from Last 3 Encounters:  05/09/24 (!) 397 lb (180.1 kg)  04/04/24 (!) 397 lb 6.4 oz (180.3 kg)  04/03/24 (!) 380 lb (172.4 kg)    GEN: Well nourished, well  developed in no acute distress NECK: Unable to determine JVD due to body habitus; No carotid bruits CARDIAC: RRR, no murmurs, rubs, gallops RESPIRATORY:  Clear with diminished bases to auscultation without rales, wheezing or rhonchi  ABDOMEN: Soft, non-tender, obese, non-distended EXTREMITIES: 1+ peripheral edema to BLE; No deformity   ASSESSMENT AND PLAN Abnormal stress testing with atypical chest discomfort and associated shortness of breath with multiple emergency department visits.  He continues to complain of ongoing chest discomfort and recently underwent Lexiscan  Myoview  which was concerning for ischemia.  There was no infarct and we could not rule out small region of ischemia in the basal and mid inferolateral wall and mid to distal inferior and inferior apical wall.  With the scan being considered a moderate risk scan ongoing symptoms of chest pain and worsening shortness of breath he has been scheduled for right and left heart catheterization.  He has been sent for updated labs today.  Chronic HFpEF with worsening shortness of breath,  weight gain, peripheral edema.  He is continued on his usual regimen.  Is difficult to determine fluid status due to his body habitus.  Had to be changed from Farxiga  to Jardiance  for insurance coverage.  Is sent for updated labs today.  Previously reached out to nephrology to ensure that he was okay for SGLT2 inhibitor therapy which he was okay with them.  He is scheduled for an updated echocardiogram that has yet to be completed but is scheduled for later in the month.  Primary hypertension with a blood pressure today 170/104 repeat blood pressures were completed but were higher.  Patient states that he has been out of his amlodipine  for approximately a month.  He has restarted on amlodipine  10 mg daily and continued on chlorthalidone  12 point Lasix  20 mg daily, hydralazine  100 mg 3 times daily and further discussion he has only been taking it twice a day  advised to take 3 times a day, Imdur  30 g daily, labetalol  300 mg twice daily, and Diovan  160 mg twice daily.  He states that previously on 12/dosing was decreased by nephrology and he had stopped taking some of his medications because his blood pressure was low.  Blood pressure was high in recent emergency department visit, last visit, and visit today.  Restarted on amlodipine  with a new prescription sent to the pharmacy of his choice he has been encouraged to monitor his pressure 1 to 2 hours postmedication administration as well.  CKD stage IIIb with a baseline serum creatinine of 1.6-1.7.  Ongoing management per nephrology.  OSA restates he was compliant with CPAP therapy.  Superobesity with a BMI of 52.43 makes pregnancies and treatment difficult.  He continues to be on possible bariatric surgery.  Would benefit greatly from weight loss  Tobacco abuse with total cessation recommended.     Informed Consent   Shared Decision Making/Informed Consent The risks [stroke (1 in 1000), death (1 in 1000), kidney failure [usually temporary] (1 in 500), bleeding (1 in 200), allergic reaction [possibly serious] (1 in 200)], benefits (diagnostic support and management of coronary artery disease) and alternatives of a cardiac catheterization were discussed in detail with Mr. Needle and he is willing to proceed.     Dispo: Patient to return to clinic see MD/APP 2 to 3 weeks postprocedure or sooner if needed for further recommendation  Signed, Valerye Kobus, NP

## 2024-05-09 NOTE — H&P (View-Only) (Signed)
 Cardiology Office Note   Date:  05/09/2024  ID:  Calvin Bailey, DOB 07/04/1981, MRN [id number] PCP: de [place], Quintin PARAS, MD  [facility] Providers Cardiologist:  Dub Huntsman, DO     History of Present Illness Calvin Bailey is a 43 y.o. male with a past medical history of hypertension, hyperlipidemia, OSA, CKD, superobesity, tobacco abuse, chronic HFpEF, sleep chronic chest pain, who presents today for follow-up.   Diagnosed with hypertension in his 75s.  Has been difficult to control previous difficulties with compliance.  01/2022 CT angio of the head and neck with no significant carotid or vertebral stenosis.  Previously been followed with hypertensive clinic as well as seen the Pharm.D. team.  Presented to [facility] in February 2024 with complaints of chest pain and hypertensive urgency.  Was feeling persistent chest pain in the right side of his chest while he was at work.  Chest pain had been related to hypertensive urgency.  Had chest pain 11/13/2022 with no PE on CT of the chest.  Blood pressure was 172/29.  Serum creatinine 1.61 with BUN of 29 and GFR 54, high-sensitivity troponin 12, EKG without ischemic changes.  Started on Imdur  and echocardiogram was ordered.  And revealed an LVEF of 60-65%, normal LV function, no RWMA, mild concentric LVH, normal RV systolic function, mild aortic valve regurgitation, no significant change from prior study.  It was recommended he have a coronary CTA as an outpatient.  Evaluated in urgent care 01/25/23 with complaints of a headache for 2 days.  Concerns if it was in the right temple area was swollen.  He had surgery upcoming soon for renal mass and is worried about a blood clot or cancer that had spread.  Blood pressure was much improved to 147/83.  He was treated with Tylenol  for his headache.  He was subsequently discharged.  He was scheduled for Lexiscan  Myoview  to complete his ischemic workup from previous hospitalization.  Coronary  CTA was deferred due to elevated serum creatinine.  Lexiscan  Myoview  was considered low risk, negative for ischemia or infarction, EF 55 to 65%.  He was last seen in clinic 12/20/2023 at that time he was planning on participating in a digital smoking cessation program  He was evaluated in the ED on 12/19/2023 in the setting of shortness of breath.  He was noted to have mild bilateral lower extremity edema.  BNP was normal.  EKG was without acute changes, troponin was negative, D-dimer was negative.  He was treated with nebulizer and Solu-Medrol  with improvement in his symptoms and was discharged home in stable condition.  There were no medication changes that were made or further testing that was ordered at that time.  Multiple emergency department visits for varying complaints of chest and abdominal pain. Work-ups were unrevealing and he was discharged home.   He was last seen in clinic 04/04/2024 with continued shortness of breath, weight gain, dyspnea on exertion, peripheral edema.  He had followed up with pulmonary and nephrology but a positive history was a cardiac issue.  He had recently been evaluated in the emergency department the day before with complaints of chest pains with negative troponins.  He was scheduled for a 2-day Lexiscan  to rule out any ischemic causes.  Was evaluated in the emergency department at [facility] 04/27/2024 with complaints of shortness of breath abdominal pain and chest pain.  Been complaining of the shortness of breath and chest tightness for 1 month.  He was in the ED  bed with no obvious distress with normal breathing pattern.  Workup was unrevealing and he was discharged home with furosemide  20 mg daily.  He returns to clinic today    ROS: 10 point review of systems has been reviewed and considered negative exception was been listed in the HPI  Studies Reviewed     Lexiscan  MPI 05/04/2024 Pharmacological myocardial perfusion imaging study with small region of ischemia  in the basal to mid inferolateral wall and mid to distal inferior and inferoapical wall Unable to definitively exclude diaphragmatic attenuation artifact as attenuation correction images not available Normal wall motion, EF estimated at 56% No EKG changes concerning for ischemia at peak stress or in recovery. Moderate risk scan  Lexiscan  MPI 01/17/2023   The study is normal. The study is low risk.   No ST deviation was noted.   LV perfusion is normal. There is no evidence of ischemia. There is no evidence of infarction.   Left ventricular function is normal. The left ventricular ejection fraction is normal (55-65%). End diastolic cavity size is normal.   This was supposed to be a 2-day study.  However, stress images are normal and does not need for rest imaging   TTE 11/20/22 1. Left ventricular ejection fraction, by estimation, is 60 to 65%. The  left ventricle has normal function. The left ventricle has no regional  wall motion abnormalities. There is mild concentric left ventricular  hypertrophy. Left ventricular diastolic  parameters were normal.   2. Right ventricular systolic function is normal. The right ventricular  size is normal. There is normal pulmonary artery systolic pressure.   3. The mitral valve is normal in structure. Trivial mitral valve  regurgitation. No evidence of mitral stenosis.   4. The aortic valve is tricuspid. Aortic valve regurgitation is mild. No  aortic stenosis is present.   5. The inferior vena cava is normal in size with greater than 50%  respiratory variability, suggesting right atrial pressure of 3 mmHg.    Risk Assessment/Calculations        Physical Exam VS:  BP (!) 174/104   Pulse 68   Ht 6' 1 (1.854 m)   Wt (!) 397 lb (180.1 kg)   SpO2 97%   BMI 52.38 kg/m        Wt Readings from Last 3 Encounters:  05/09/24 (!) 397 lb (180.1 kg)  04/04/24 (!) 397 lb 6.4 oz (180.3 kg)  04/03/24 (!) 380 lb (172.4 kg)    GEN: Well nourished, well  developed in no acute distress NECK: Unable to determine JVD due to body habitus; No carotid bruits CARDIAC: RRR, no murmurs, rubs, gallops RESPIRATORY:  Clear with diminished bases to auscultation without rales, wheezing or rhonchi  ABDOMEN: Soft, non-tender, obese, non-distended EXTREMITIES: 1+ peripheral edema to BLE; No deformity   ASSESSMENT AND PLAN Abnormal stress testing with atypical chest discomfort and associated shortness of breath with multiple emergency department visits.  He continues to complain of ongoing chest discomfort and recently underwent Lexiscan  Myoview  which was concerning for ischemia.  There was no infarct and we could not rule out small region of ischemia in the basal and mid inferolateral wall and mid to distal inferior and inferior apical wall.  With the scan being considered a moderate risk scan ongoing symptoms of chest pain and worsening shortness of breath he has been scheduled for right and left heart catheterization.  He has been sent for updated labs today.  Chronic HFpEF with worsening shortness of breath,  weight gain, peripheral edema.  He is continued on his usual regimen.  Is difficult to determine fluid status due to his body habitus.  Had to be changed from Farxiga  to Jardiance  for insurance coverage.  Is sent for updated labs today.  Previously reached out to nephrology to ensure that he was okay for SGLT2 inhibitor therapy which he was okay with them.  He is scheduled for an updated echocardiogram that has yet to be completed but is scheduled for later in the month.  Primary hypertension with a blood pressure today 170/104 repeat blood pressures were completed but were higher.  Patient states that he has been out of his amlodipine  for approximately a month.  He has restarted on amlodipine  10 mg daily and continued on chlorthalidone  12 point Lasix  20 mg daily, hydralazine  100 mg 3 times daily and further discussion he has only been taking it twice a day  advised to take 3 times a day, Imdur  30 g daily, labetalol  300 mg twice daily, and Diovan  160 mg twice daily.  He states that previously on 12/dosing was decreased by nephrology and he had stopped taking some of his medications because his blood pressure was low.  Blood pressure was high in recent emergency department visit, last visit, and visit today.  Restarted on amlodipine  with a new prescription sent to the pharmacy of his choice he has been encouraged to monitor his pressure 1 to 2 hours postmedication administration as well.  CKD stage IIIb with a baseline serum creatinine of 1.6-1.7.  Ongoing management per nephrology.  OSA restates he was compliant with CPAP therapy.  Superobesity with a BMI of 52.43 makes pregnancies and treatment difficult.  He continues to be on possible bariatric surgery.  Would benefit greatly from weight loss  Tobacco abuse with total cessation recommended.     Informed Consent   Shared Decision Making/Informed Consent The risks [stroke (1 in 1000), death (1 in 1000), kidney failure [usually temporary] (1 in 500), bleeding (1 in 200), allergic reaction [possibly serious] (1 in 200)], benefits (diagnostic support and management of coronary artery disease) and alternatives of a cardiac catheterization were discussed in detail with Mr. Needle and he is willing to proceed.     Dispo: Patient to return to clinic see MD/APP 2 to 3 weeks postprocedure or sooner if needed for further recommendation  Signed, Valerye Kobus, NP

## 2024-05-09 NOTE — Patient Instructions (Signed)
 Medication Instructions:  Your physician recommends the following medication changes.  CHANGE: Amlodipine  5 mg two times daily  *If you need a refill on your cardiac medications before your next appointment, please call your pharmacy*  Lab Work: Your provider would like for you to have following labs drawn today CBC, BMP.   If you have labs (blood work) drawn today and your tests are completely normal, you will receive your results only by: MyChart Message (if you have MyChart) OR A paper copy in the mail If you have any lab test that is abnormal or we need to change your treatment, we will call you to review the results.  Testing/Procedures:  [facility] DEPT OF [facility] [place] [address] [place] [address] Dept: [phone] Loc: [phone]  BRISTOL SOY  05/09/2024  You are scheduled for a Cardiac Catheterization on Friday, August 22 with Dr. Lonni End.  1. Please arrive at the [facility] Entrance of [facility], [address], [place] [address] at 6:30 AM (This is 1 hour(s) prior to your procedure time).  Proceed to the Check-In Desk directly inside the entrance.  Procedure Parking: Use the entrance off of the [address] side of the hospital. Turn right upon entering and follow the driveway to parking that is directly in front of the Heart [facility] Vascular Center. There is no valet parking available at this entrance, however there is an awning directly in front of the Heart & Vascular Center for drop off/ pick up for patients.  Special note: Every effort is made to have your procedure done on time. Please understand that emergencies sometimes delay scheduled procedures.  2. Diet: No solid foods after midnight. You may have clear liquids until you arrive at the hospital.  List of approved liquids water , clear juice, clear tea, black coffee, fruit juices, non-citric  and without pulp, carbonated beverages, Gatorade, Kool -Aid, plain Jello-O and plain ice popsicles.   3. Hydration: You may drink approved liquids (see below) until you arrive at the hospital. On the way to the hospital, please drink 8 oz (1/2 plastic bottle) of water .    List of approved liquids water , clear juice, clear tea, black coffee, fruit juices, non-citric and without pulp, carbonated beverages, Gatorade, Kool -Aid, plain Jello-O and plain ice popsicles.      4. Labs: You will need to have blood drawn on Wednesday, August 13 at [facility], Go to 1st desk on your right to register.  Address: [address] [place], [place] [address]  Open: 8am - 5pm  Phone: [phone]. You do not need to be fasting.  5. Medication instructions in preparation for your procedure:   Contrast Allergy: No  Stop taking, Diovan  (Valsartan ) Thursday, August 21,  JardianceThursday, August 21,  Diuretics : Lasix  and Spironolactone  Friday, August,22  On the morning of your procedure, take your morning medicines NOT listed above.  You may use sips of water .  6. Plan to go home the same day, you will only stay overnight if medically necessary. 7. Bring a current list of your medications and current insurance cards. 8. You MUST have a responsible person to drive you home. 9. Someone MUST be with you the first 24 hours after you arrive home or your discharge will be delayed. 10. Please wear clothes that are easy to get on and off and wear slip-on shoes.  Thank you for  allowing us  to care for you!   -- [facility]   Follow-Up: At [facility], you and your health needs are our priority.  As part of our continuing mission to provide you with exceptional heart care, our providers are all part of one team.  This team includes your primary Cardiologist (physician) and Advanced Practice Providers or APPs (Physician Assistants and Nurse Practitioners)  who all work together to provide you with the care you need, when you need it.  Your next appointment:   2 - 3 week(s)  Provider:   Tylene Lunch, NP

## 2024-05-10 ENCOUNTER — Ambulatory Visit: Admitting: Internal Medicine

## 2024-05-10 ENCOUNTER — Ambulatory Visit: Payer: Self-pay | Admitting: Cardiology

## 2024-05-10 LAB — CBC
Hematocrit: 43.6 % (ref 37.5–51.0)
Hemoglobin: 14.2 g/dL (ref 13.0–17.7)
MCH: 29.3 pg (ref 26.6–33.0)
MCHC: 32.6 g/dL (ref 31.5–35.7)
MCV: 90 fL (ref 79–97)
Platelets: 258 x10E3/uL (ref 150–450)
RBC: 4.85 x10E6/uL (ref 4.14–5.80)
RDW: 12.9 % (ref 11.6–15.4)
WBC: 8.1 x10E3/uL (ref 3.4–10.8)

## 2024-05-10 LAB — BASIC METABOLIC PANEL WITH GFR
BUN/Creatinine Ratio: 12 (ref 9–20)
BUN: 20 mg/dL (ref 6–24)
CO2: 19 mmol/L — ABNORMAL LOW (ref 20–29)
Calcium: 9.3 mg/dL (ref 8.7–10.2)
Chloride: 106 mmol/L (ref 96–106)
Creatinine, Ser: 1.67 mg/dL — ABNORMAL HIGH (ref 0.76–1.27)
Glucose: 86 mg/dL (ref 70–99)
Potassium: 4.7 mmol/L (ref 3.5–5.2)
Sodium: 140 mmol/L (ref 134–144)
eGFR: 51 mL/min/1.73 — ABNORMAL LOW (ref >59)

## 2024-05-10 NOTE — Progress Notes (Signed)
 Kidney function remains at baseline.  CBC is unremarkable with stable hemoglobin.  No additional changes needed to her medication regimen prior to her upcoming procedure.

## 2024-05-11 ENCOUNTER — Other Ambulatory Visit: Payer: Self-pay

## 2024-05-11 ENCOUNTER — Other Ambulatory Visit (HOSPITAL_BASED_OUTPATIENT_CLINIC_OR_DEPARTMENT_OTHER): Payer: Self-pay

## 2024-05-12 LAB — LAB REPORT - SCANNED
Albumin, Urine POC: 57.1
Creatinine, POC: 152.3 mg/dL
Microalb Creat Ratio: 37

## 2024-05-17 ENCOUNTER — Ambulatory Visit (HOSPITAL_BASED_OUTPATIENT_CLINIC_OR_DEPARTMENT_OTHER): Admitting: Family Medicine

## 2024-05-17 ENCOUNTER — Telehealth: Payer: Self-pay | Admitting: *Deleted

## 2024-05-17 NOTE — Telephone Encounter (Signed)
 Called to confirm/remind patient of their procedure.   Scheduled for: R/L heart cath  [x]  Date 05/18/24  [x]  Time 07:30 am [x]  Arrival time 06:30 am [x]  Location [facility]   [x]  Designated Driver  [x]  Instructions, time, and location reviewed with patient    [x]  H&P within 30 days  [x]  EKG within 30 days  [x]  Orders  [x]  Labs  [x]  GFR 51  [x]  Diet  [x]  Medication instructions Reviewed  [x]  Hydration orders reviewed   [x]  Spoke with patient and reviewed all information []  VM Full/unable to leave message  []  Phone not in service  Patient did ask about a Gisele and advised that he needs a person to take him home and be with him 24 hours after the procedure. Reviewed that they would not do his procedure if he does not have a person bringing him. He verbalized understanding.

## 2024-05-18 ENCOUNTER — Ambulatory Visit
Admission: RE | Admit: 2024-05-18 | Discharge: 2024-05-18 | Disposition: A | Discharge status: Home / Self Care | Attending: Internal Medicine | Admitting: Internal Medicine

## 2024-05-18 ENCOUNTER — Encounter
Admission: RE | Disposition: A | Discharge status: Home / Self Care | Payer: Self-pay | Source: Home / Self Care | Attending: Internal Medicine

## 2024-05-18 DIAGNOSIS — R0602 Shortness of breath: Secondary | ICD-10-CM

## 2024-05-18 DIAGNOSIS — Z79899 Other long term (current) drug therapy: Secondary | ICD-10-CM | POA: Insufficient documentation

## 2024-05-18 DIAGNOSIS — Z720 Tobacco use: Secondary | ICD-10-CM | POA: Insufficient documentation

## 2024-05-18 DIAGNOSIS — E785 Hyperlipidemia, unspecified: Secondary | ICD-10-CM | POA: Diagnosis not present

## 2024-05-18 DIAGNOSIS — Z6841 Body Mass Index (BMI) 40.0 and over, adult: Secondary | ICD-10-CM | POA: Diagnosis not present

## 2024-05-18 DIAGNOSIS — R931 Abnormal findings on diagnostic imaging of heart and coronary circulation: Secondary | ICD-10-CM | POA: Diagnosis not present

## 2024-05-18 DIAGNOSIS — R9439 Abnormal result of other cardiovascular function study: Secondary | ICD-10-CM | POA: Diagnosis present

## 2024-05-18 DIAGNOSIS — I5032 Chronic diastolic (congestive) heart failure: Secondary | ICD-10-CM | POA: Insufficient documentation

## 2024-05-18 DIAGNOSIS — N1832 Chronic kidney disease, stage 3b: Secondary | ICD-10-CM | POA: Diagnosis not present

## 2024-05-18 DIAGNOSIS — I272 Pulmonary hypertension, unspecified: Secondary | ICD-10-CM | POA: Diagnosis not present

## 2024-05-18 DIAGNOSIS — I2510 Atherosclerotic heart disease of native coronary artery without angina pectoris: Secondary | ICD-10-CM | POA: Insufficient documentation

## 2024-05-18 DIAGNOSIS — I130 Hypertensive heart and chronic kidney disease with heart failure and stage 1 through stage 4 chronic kidney disease, or unspecified chronic kidney disease: Secondary | ICD-10-CM | POA: Diagnosis not present

## 2024-05-18 DIAGNOSIS — G4733 Obstructive sleep apnea (adult) (pediatric): Secondary | ICD-10-CM | POA: Diagnosis not present

## 2024-05-18 DIAGNOSIS — R079 Chest pain, unspecified: Secondary | ICD-10-CM | POA: Diagnosis present

## 2024-05-18 HISTORY — PX: RIGHT/LEFT HEART CATH AND CORONARY ANGIOGRAPHY: CATH118266

## 2024-05-18 LAB — POCT I-STAT EG7
Acid-base deficit: 4 mmol/L — ABNORMAL HIGH (ref 0.0–2.0)
Bicarbonate: 23 mmol/L (ref 20.0–28.0)
Calcium, Ion: 1.23 mmol/L (ref 1.15–1.40)
HCT: 41 % (ref 39.0–52.0)
Hemoglobin: 13.9 g/dL (ref 13.0–17.0)
O2 Saturation: 70 %
Potassium: 4.2 mmol/L (ref 3.5–5.1)
Sodium: 142 mmol/L (ref 135–145)
TCO2: 24 mmol/L (ref 22–32)
pCO2, Ven: 47.3 mmHg (ref 44–60)
pH, Ven: 7.294 (ref 7.25–7.43)
pO2, Ven: 41 mmHg (ref 32–45)

## 2024-05-18 LAB — POCT I-STAT 7, (LYTES, BLD GAS, ICA,H+H)
Acid-base deficit: 5 mmol/L — ABNORMAL HIGH (ref 0.0–2.0)
Bicarbonate: 20.6 mmol/L (ref 20.0–28.0)
Calcium, Ion: 1.24 mmol/L (ref 1.15–1.40)
HCT: 41 % (ref 39.0–52.0)
Hemoglobin: 13.9 g/dL (ref 13.0–17.0)
O2 Saturation: 95 %
Potassium: 4.2 mmol/L (ref 3.5–5.1)
Sodium: 141 mmol/L (ref 135–145)
TCO2: 22 mmol/L (ref 22–32)
pCO2 arterial: 39.9 mmHg (ref 32–48)
pH, Arterial: 7.32 — ABNORMAL LOW (ref 7.35–7.45)
pO2, Arterial: 81 mmHg — ABNORMAL LOW (ref 83–108)

## 2024-05-18 SURGERY — RIGHT/LEFT HEART CATH AND CORONARY ANGIOGRAPHY
Anesthesia: Moderate Sedation | Laterality: Bilateral

## 2024-05-18 MED ORDER — HEPARIN SODIUM (PORCINE) 1000 UNIT/ML IJ SOLN
INTRAMUSCULAR | Status: DC | PRN
  Administered 2024-05-18: 6000 [IU] via INTRAVENOUS

## 2024-05-18 MED ORDER — VERAPAMIL HCL 2.5 MG/ML IV SOLN
INTRAVENOUS | Status: DC | PRN
  Administered 2024-05-18 (×2): 2.5 mg via INTRA_ARTERIAL

## 2024-05-18 MED ORDER — HYDRALAZINE HCL 20 MG/ML IJ SOLN: 10.0000 mg | INTRAMUSCULAR | Status: DC | PRN

## 2024-05-18 MED ORDER — ASPIRIN 81 MG PO CHEW
81.0000 mg | CHEWABLE_TABLET | ORAL | Status: AC
  Administered 2024-05-18: 81 mg via ORAL

## 2024-05-18 MED ORDER — SODIUM CHLORIDE 0.9 % IV SOLN: 250.0000 mL | INTRAVENOUS | Status: DC | PRN

## 2024-05-18 MED ORDER — FENTANYL CITRATE (PF) 100 MCG/2ML IJ SOLN
INTRAMUSCULAR | Status: DC | PRN
  Administered 2024-05-18: 25 ug via INTRAVENOUS

## 2024-05-18 MED ORDER — ACETAMINOPHEN 325 MG PO TABS: 650.0000 mg | ORAL_TABLET | ORAL | Status: DC | PRN

## 2024-05-18 MED ORDER — VERAPAMIL HCL 2.5 MG/ML IV SOLN
INTRAVENOUS | Status: AC
  Filled 2024-05-18: qty 2

## 2024-05-18 MED ORDER — SODIUM CHLORIDE 0.9% FLUSH: 3.0000 mL | INTRAVENOUS | Status: DC | PRN

## 2024-05-18 MED ORDER — FREE WATER: 250.0000 mL | Freq: Once | Status: DC

## 2024-05-18 MED ORDER — ONDANSETRON HCL 4 MG/2ML IJ SOLN: 4.0000 mg | Freq: Four times a day (QID) | INTRAMUSCULAR | Status: DC | PRN

## 2024-05-18 MED ORDER — MIDAZOLAM HCL 2 MG/2ML IJ SOLN
INTRAMUSCULAR | Status: DC | PRN
  Administered 2024-05-18: 1 mg via INTRAVENOUS

## 2024-05-18 MED ORDER — SODIUM CHLORIDE 0.9% FLUSH: 3.0000 mL | Freq: Two times a day (BID) | INTRAVENOUS | Status: DC

## 2024-05-18 MED ORDER — LIDOCAINE HCL 1 % IJ SOLN
INTRAMUSCULAR | Status: AC
  Filled 2024-05-18: qty 20

## 2024-05-18 MED ORDER — FENTANYL CITRATE (PF) 100 MCG/2ML IJ SOLN
INTRAMUSCULAR | Status: AC
  Filled 2024-05-18: qty 2

## 2024-05-18 MED ORDER — ASPIRIN 81 MG PO TBEC
81.0000 mg | DELAYED_RELEASE_TABLET | Freq: Every day | ORAL | Status: DC
Start: 2024-05-18 — End: 2024-06-12

## 2024-05-18 MED ORDER — ASPIRIN 81 MG PO CHEW
CHEWABLE_TABLET | ORAL | Status: AC
  Filled 2024-05-18: qty 1

## 2024-05-18 MED ORDER — HEPARIN SODIUM (PORCINE) 1000 UNIT/ML IJ SOLN
INTRAMUSCULAR | Status: AC
  Filled 2024-05-18: qty 10

## 2024-05-18 MED ORDER — NITROGLYCERIN 0.4 MG SL SUBL: 0.4000 mg | SUBLINGUAL_TABLET | SUBLINGUAL | 99 refills | Status: AC | PRN

## 2024-05-18 MED ORDER — NITROGLYCERIN 1 MG/10 ML FOR IR/CATH LAB
INTRA_ARTERIAL | Status: AC
  Filled 2024-05-18: qty 10

## 2024-05-18 MED ORDER — LABETALOL HCL 5 MG/ML IV SOLN
10.0000 mg | INTRAVENOUS | Status: DC | PRN
Start: 2024-05-18 — End: 2024-05-18

## 2024-05-18 MED ORDER — MIDAZOLAM HCL 2 MG/2ML IJ SOLN
INTRAMUSCULAR | Status: AC
Start: 2024-05-18 — End: 2024-05-18
  Filled 2024-05-18: qty 2

## 2024-05-18 MED ORDER — FREE WATER
250.0000 mL | Freq: Once | Status: AC
  Administered 2024-05-18: 250 mL via ORAL

## 2024-05-18 MED ORDER — HEPARIN (PORCINE) IN NACL 1000-0.9 UT/500ML-% IV SOLN
INTRAVENOUS | Status: DC | PRN
  Administered 2024-05-18: 1000 mL

## 2024-05-18 MED ORDER — IOHEXOL 300 MG/ML  SOLN
INTRAMUSCULAR | Status: DC | PRN
  Administered 2024-05-18: 32 mL

## 2024-05-18 MED ORDER — LIDOCAINE HCL (PF) 1 % IJ SOLN
INTRAMUSCULAR | Status: DC | PRN
  Administered 2024-05-18 (×2): 2 mL

## 2024-05-18 MED ORDER — NITROGLYCERIN 1 MG/10 ML FOR IR/CATH LAB
INTRA_ARTERIAL | Status: DC | PRN
  Administered 2024-05-18: 200 ug via INTRACORONARY

## 2024-05-18 MED ORDER — SODIUM CHLORIDE 0.9 % IV SOLN
250.0000 mL | INTRAVENOUS | Status: DC | PRN
  Administered 2024-05-18: 250 mL via INTRAVENOUS

## 2024-05-18 MED ORDER — HEPARIN (PORCINE) IN NACL 1000-0.9 UT/500ML-% IV SOLN
INTRAVENOUS | Status: AC
  Filled 2024-05-18: qty 1000

## 2024-05-18 MED ORDER — FUROSEMIDE 20 MG PO TABS: 20.0000 mg | ORAL_TABLET | Freq: Every day | ORAL | Status: AC | PRN

## 2024-05-18 SURGICAL SUPPLY — 13 items
CATH BALLN WEDGE 5F 110CM (CATHETERS) IMPLANT
CATH INFINITI 5 FR JL3.5 (CATHETERS) IMPLANT
CATH INFINITI JR4 5F (CATHETERS) IMPLANT
DEVICE RAD TR BAND REGULAR (VASCULAR PRODUCTS) IMPLANT
DRAPE BRACHIAL (DRAPES) IMPLANT
GLIDESHEATH SLEND SS 6F .021 (SHEATH) IMPLANT
GUIDEWIRE INQWIRE 1.5J.035X260 (WIRE) IMPLANT
KIT SYRINGE INJ CVI SPIKEX1 (MISCELLANEOUS) IMPLANT
PACK CARDIAC CATH (CUSTOM PROCEDURE TRAY) ×1 IMPLANT
PANNUS RETENTION SYSTEM 2 PAD (MISCELLANEOUS) IMPLANT
SET ATX-X65L (MISCELLANEOUS) IMPLANT
SHEATH GLIDE SLENDER 4/5FR (SHEATH) IMPLANT
STATION PROTECTION PRESSURIZED (MISCELLANEOUS) IMPLANT

## 2024-05-18 NOTE — Interval H&P Note (Signed)
 History and Physical Interval Note:  05/18/2024 7:03 AM  Calvin Bailey  has presented today for surgery, with the diagnosis of shortness of breath, chest pressure, and abnormal stress test.  The various methods of treatment have been discussed with the patient and family. After consideration of risks, benefits and other options for treatment, the patient has consented to  Procedure(s): RIGHT/LEFT HEART CATH AND CORONARY ANGIOGRAPHY (Bilateral) as a surgical intervention.  The patient's history has been reviewed, patient examined, no change in status, stable for surgery.  I have reviewed the patient's chart and labs.  Questions were answered to the patient's satisfaction.    Cath Lab Visit (complete for each Cath Lab visit)  Clinical Evaluation Leading to the Procedure:   ACS: No.  Non-ACS:    Anginal Classification: CCS III  Anti-ischemic medical therapy: Maximal Therapy (2 or more classes of medications)  Non-Invasive Test Results: Intermediate-risk stress test findings: cardiac mortality 1-3%/year  Prior CABG: No previous CABG  Calvin Bailey

## 2024-05-21 ENCOUNTER — Encounter: Payer: Self-pay | Admitting: Internal Medicine

## 2024-05-22 ENCOUNTER — Ambulatory Visit: Payer: Self-pay | Admitting: Cardiology

## 2024-05-22 NOTE — Progress Notes (Signed)
 Recommendation after catheterization continue with medical therapy and risk factor modification to prevent progression of CAD.  Continue on isosorbide  and amlodipine  to treat possible component of coronary vasospasms.  Recommend continue with previously scheduled echocardiogram for further assessment of possible LVOT/aortic valve gradient.  If echocardiogram is unrevealing or inconclusive we will consider cardiac MRI.

## 2024-05-24 ENCOUNTER — Ambulatory Visit (HOSPITAL_COMMUNITY)
Admission: RE | Admit: 2024-05-24 | Discharge: 2024-05-24 | Disposition: A | Discharge status: Home / Self Care | Source: Ambulatory Visit | Attending: Cardiology | Admitting: Cardiology

## 2024-05-24 DIAGNOSIS — R0602 Shortness of breath: Secondary | ICD-10-CM | POA: Insufficient documentation

## 2024-05-24 LAB — ECHOCARDIOGRAM COMPLETE
AR max vel: 3.69 cm2
AV Area VTI: 4.06 cm2
AV Area mean vel: 3.75 cm2
AV Mean grad: 9.8 mmHg
AV Peak grad: 17.9 mmHg
Ao pk vel: 2.12 m/s
Area-P 1/2: 3.31 cm2
S' Lateral: 3.2 cm

## 2024-05-25 ENCOUNTER — Encounter: Payer: Self-pay | Admitting: Internal Medicine

## 2024-05-25 ENCOUNTER — Ambulatory Visit: Admitting: Internal Medicine

## 2024-05-25 ENCOUNTER — Ambulatory Visit: Admitting: Cardiology

## 2024-05-25 VITALS — BP 120/80 | HR 76 | Temp 98.4°F | Ht 73.0 in | Wt 392.4 lb

## 2024-05-25 DIAGNOSIS — F17210 Nicotine dependence, cigarettes, uncomplicated: Secondary | ICD-10-CM | POA: Diagnosis not present

## 2024-05-25 DIAGNOSIS — J449 Chronic obstructive pulmonary disease, unspecified: Secondary | ICD-10-CM | POA: Diagnosis not present

## 2024-05-25 DIAGNOSIS — G4733 Obstructive sleep apnea (adult) (pediatric): Secondary | ICD-10-CM

## 2024-05-25 DIAGNOSIS — Z720 Tobacco use: Secondary | ICD-10-CM

## 2024-05-25 DIAGNOSIS — R911 Solitary pulmonary nodule: Secondary | ICD-10-CM

## 2024-05-25 DIAGNOSIS — I10 Essential (primary) hypertension: Secondary | ICD-10-CM

## 2024-05-25 NOTE — Progress Notes (Signed)
 [facility] [place] [facility] Consultation      Date: 05/25/2024,   MRN# [id number] REVEL STELLMACH [id number]  July 2025 pulmonary function testing Pulmonary function testing postbronchodilator FEV1 FVC ratio 75% predicted FEV1 is 59% predicted FVC is 56% predicted TLC 82% predicted RV 86% predicted DLCO 105% predicted No significant bronchodilator response Flow-volume loops suggestive of restrictive and obstructive pattern Findings suggest underlying obstructive lung disease and restrictive lung disease most likely from morbid obesity   CHIEF COMPLAINT:   Follow-up assessment for shortness of breath Follow-up assessment for tobacco abuse Follow-up assessment for COPD Follow-up assessment for abnormal CT chest   HISTORY OF PRESENT ILLNESS  Follow-up assessment for shortness of breath January 2025 diagnosed with influenza A Patient had urgent care visit was given prednisone  and antibiotics  February 2025 admitted for shortness of breath has CKD stage III found to have hyperkalemia-electrolytes managed properly patient symptoms resolved  May 2025-admitted for shortness of breath in the ER patient self medicated himself with Lasix  and symptoms improved  Patient also has a diagnosis of kidney cancer which is being taken care of by interventional radiology undergoing cryotherapy  Patient continues to smoke Patient being evaluated for bariatric surgery currently weighs 392 pounds  No exacerbation at this time No evidence of heart failure at this time No evidence or signs of infection at this time No respiratory distress No fevers, chills, nausea, vomiting, diarrhea No evidence of lower extremity edema No evidence hemoptysis     CT chest July 2025 Left lower lobe lung nodule subcentimeter in nature 2 to 3 mm Follow-up CT in 3 months    PAST MEDICAL HISTORY   Past Medical History:  Diagnosis Date   Arthritis    Cancer (HCC)    right kidney   CHF (congestive  heart failure) (HCC)    pt. denies   Chronic kidney disease    Dizziness 09/25/2019   GERD (gastroesophageal reflux disease)    HTN (hypertension)    Obesity hypoventilation syndrome (HCC) 04/08/2014   OSA (obstructive sleep apnea) 04/08/2014   PFO (patent foramen ovale)    Pneumonia    Sleep apnea    wears Cpap     SURGICAL HISTORY   Past Surgical History:  Procedure Laterality Date   IR RADIOLOGIST EVAL & MGMT  03/07/2024   RADIOLOGY WITH ANESTHESIA Right 02/16/2023   Procedure: RIGHT RENAL MASS CRYO ABLATION;  Surgeon: Philip Cornet, MD;  Location: [facility];  Service: Anesthesiology;  Laterality: Right;   RIGHT/LEFT HEART CATH AND CORONARY ANGIOGRAPHY Bilateral 05/18/2024   Procedure: RIGHT/LEFT HEART CATH AND CORONARY ANGIOGRAPHY;  Surgeon: Mady Bruckner, MD;  Location: [facility] INVASIVE CV LAB;  Service: Cardiovascular;  Laterality: Bilateral;   two knee surgeries Left 1995     FAMILY HISTORY   Family History  Problem Relation Age of Onset   Hypertension Mother    Multiple sclerosis Mother    Hypertension Father    Hypertension Brother    Hyperlipidemia Paternal Grandfather    Heart failure Paternal Grandfather    Diabetes Maternal Grandmother    Heart failure Maternal Grandfather    CVA Maternal Uncle      SOCIAL HISTORY   Social History   Tobacco Use   Smoking status: Every Day    Current packs/day: 0.00    Average packs/day: 0.1 packs/day for 16.0 years (1.6 ttl pk-yrs)    Types: Cigarettes    Start date: 06/08/2006    Last attempt to quit: 06/08/2022    Years  since quitting: 1.9    Passive exposure: Current   Smokeless tobacco: Never   Tobacco comments:    02/01/24 patient smokes 3-6 cigarettes daily  Vaping Use   Vaping status: Never Used  Substance Use Topics   Alcohol use: Not Currently    Comment: sparingly    Drug use: No     MEDICATIONS    Home Medication:  Current Outpatient Rx   Order #: [id number] Class: Print   Order #: [id number] Class:  Normal   Order #: [id number] Class: Normal   Order #: [id number] Class: OTC   Order #: [id number] Class: Normal   Order #: [id number] Class: Normal   Order #: [id number] Class: No Print   Order #: [id number] Class: Normal   Order #: [id number] Class: Normal   Order #: [id number] Class: Normal   Order #: [id number] Class: Normal   Order #: [id number] Class: Normal   Order #: [id number] Class: Normal    Current Medication:  Current Outpatient Medications:    acetaminophen  (TYLENOL ) 500 MG tablet, Take 1 tablet (500 mg total) by mouth every 6 (six) hours as needed., Disp: 30 tablet, Rfl: 0   allopurinol  (ZYLOPRIM ) 100 MG tablet, TAKE 1 TABLET(100 MG) BY MOUTH DAILY (Patient taking differently: Take 100 mg by mouth daily as needed (gout pain).), Disp: 90 tablet, Rfl: 0   amLODipine  (NORVASC ) 5 MG tablet, Take 1 tablet (5 mg total) by mouth 2 (two) times daily., Disp: 180 tablet, Rfl: 3   aspirin  EC 81 MG tablet, Take 1 tablet (81 mg total) by mouth daily. Swallow whole., Disp: , Rfl:    colchicine  0.6 MG tablet, Take 1 tablet (0.6 mg total) by mouth 2 (two) times daily. (Patient taking differently: Take 0.6 mg by mouth 2 (two) times daily as needed (gout).), Disp: 60 tablet, Rfl: 0   empagliflozin  (JARDIANCE ) 10 MG TABS tablet, Take 1 tablet (10 mg total) by mouth daily before breakfast., Disp: 30 tablet, Rfl: 11   furosemide  (LASIX ) 20 MG tablet, Take 1 tablet (20 mg total) by mouth daily as needed for fluid or edema., Disp: , Rfl:    hydrALAZINE  (APRESOLINE ) 100 MG tablet, Take 1 tablet (100 mg total) by mouth 3 (three) times daily. (Patient taking differently: Take 100 mg by mouth 2 (two) times daily.), Disp: 270 tablet, Rfl: 3   isosorbide  mononitrate (IMDUR ) 30 MG 24 hr tablet, Take 1 tablet (30 mg total) by mouth daily., Disp: 90 tablet, Rfl: 3   labetalol  (NORMODYNE ) 300 MG tablet, TAKE 1 TABLET(300 MG) BY MOUTH TWICE DAILY, Disp: 180 tablet, Rfl: 1   nitroGLYCERIN  (NITROSTAT ) 0.4 MG SL tablet, Place 1 tablet  (0.4 mg total) under the tongue every 5 (five) minutes as needed for chest pain., Disp: 25 tablet, Rfl: PRN   spironolactone  (ALDACTONE ) 25 MG tablet, TAKE 1 TABLET(25 MG) BY MOUTH DAILY, Disp: 30 tablet, Rfl: 11   valsartan  (DIOVAN ) 160 MG tablet, TAKE 1 TABLET(160 MG) BY MOUTH TWICE DAILY, Disp: 180 tablet, Rfl: 3    ALLERGIES   Hydrochlorothiazide  and Lisinopril   BP 120/80   Pulse 76   Temp 98.4 F (36.9 C)   Ht 6' 1 (1.854 m)   Wt (!) 392 lb 6.4 oz (178 kg)   SpO2 96%   BMI 51.77 kg/m   Review of Systems: Gen:  Denies  fever, sweats, chills weight loss  HEENT: Denies blurred vision, double vision, ear pain, eye pain, hearing loss, nose bleeds, sore throat Cardiac:  No dizziness, chest pain or heaviness, chest tightness,edema, No JVD Resp:   No  cough, -sputum production, +shortness of breath,-wheezing, -hemoptysis,  Other:  All other systems negative   Physical Examination:   General Appearance: No distress  EYES PERRLA, EOM intact.   NECK Supple, No JVD Pulmonary: normal breath sounds, No wheezing.  CardiovascularNormal S1,S2.  No m/r/g.   Abdomen: Benign, Soft, non-tender. Neurology UE/LE 5/5 strength, no focal deficits Ext pulses intact, cap refill intact +edema ALL OTHER ROS ARE NEGATIVE      IMAGING    ECHOCARDIOGRAM COMPLETE Result Date: 05/24/2024    ECHOCARDIOGRAM REPORT   Patient Name:   MORLEY GAUMER Date of Exam: 05/24/2024 Medical Rec #:  [id number]        Height:       73.0 in Accession #:    [id number]       Weight:       402.0 lb Date of Birth:  1981/01/17        BSA:          2.895 m Patient Age:    42 years         BP:           147/87 mmHg Patient Gender: M                HR:           68 bpm. Exam Location:  [address] Procedure: 2D Echo, Cardiac Doppler, Color Doppler and Strain Analysis (Both            Spectral and Color Flow Doppler were utilized during procedure). Indications:    R06.02 Shortness of Breath  History:        Patient has  prior history of Echocardiogram examinations, most                 recent 11/20/2022. CHF, Signs/Symptoms:Shortness of Breath,                 Edema, Dizziness/Lightheadedness and Chest Pain; Risk                 Factors:Sleep Apnea, Hypertension, Dyslipidemia, Family History                 of Coronary Artery Disease and Current Smoker. Obesity, Chronic                 Kidney Disease.  Sonographer:    Heather Hawks RDCS Referring Phys: SHERI HAMMOCK IMPRESSIONS  1. Left ventricular ejection fraction, by estimation, is 60 to 65%. The left ventricle has normal function. Left ventricular endocardial border not optimally defined to evaluate regional wall motion. The left ventricular internal cavity size was mildly dilated. Left ventricular diastolic parameters are consistent with Grade II diastolic dysfunction (pseudonormalization). The average left ventricular global longitudinal strain is -18.3 %. The global longitudinal strain is normal.  2. Right ventricular systolic function is normal. The right ventricular size is moderately enlarged. Tricuspid regurgitation signal is inadequate for assessing PA pressure.  3. Left atrial size was moderately dilated.  4. The mitral valve is normal in structure. No evidence of mitral valve regurgitation. No evidence of mitral stenosis.  5. The aortic valve is tricuspid. Aortic valve regurgitation is trivial. Aortic valve sclerosis/calcification is present, without any evidence of aortic stenosis. Aortic valve area, by VTI measures 4.06 cm. Aortic valve mean gradient measures 9.8 mmHg.  Aortic valve Vmax measures 2.12 m/s.  6. The inferior vena cava is normal in size with greater than 50% respiratory variability, suggesting right atrial pressure of 3 mmHg.  FINDINGS  Left Ventricle: Left ventricular ejection fraction, by estimation, is 60 to 65%. The left ventricle has normal function. Left ventricular endocardial border not optimally defined to evaluate regional wall motion. The  average left ventricular global longitudinal strain is -18.3 %. Strain was performed and the global longitudinal strain is normal. The left ventricular internal cavity size was mildly dilated. There is no left ventricular hypertrophy. Left ventricular diastolic parameters are consistent with Grade II diastolic dysfunction (pseudonormalization). Normal left ventricular filling pressure. Right Ventricle: The right ventricular size is moderately enlarged. No increase in right ventricular wall thickness. Right ventricular systolic function is normal. Tricuspid regurgitation signal is inadequate for assessing PA pressure. Left Atrium: Left atrial size was moderately dilated. Right Atrium: Right atrial size was normal in size. Pericardium: There is no evidence of pericardial effusion. Mitral Valve: The mitral valve is normal in structure. Mild mitral annular calcification. No evidence of mitral valve regurgitation. No evidence of mitral valve stenosis. Tricuspid Valve: The tricuspid valve is normal in structure. Tricuspid valve regurgitation is not demonstrated. No evidence of tricuspid stenosis. Aortic Valve: The aortic valve is tricuspid. Aortic valve regurgitation is trivial. Aortic valve sclerosis/calcification is present, without any evidence of aortic stenosis. Aortic valve mean gradient measures 9.8 mmHg. Aortic valve peak gradient measures 17.9 mmHg. Aortic valve area, by VTI measures 4.06 cm. Pulmonic Valve: The pulmonic valve was normal in structure. Pulmonic valve regurgitation is not visualized. No evidence of pulmonic stenosis. Aorta: The aortic root is normal in size and structure. Venous: The inferior vena cava is normal in size with greater than 50% respiratory variability, suggesting right atrial pressure of 3 mmHg. IAS/Shunts: No atrial level shunt detected by color flow Doppler.  LEFT VENTRICLE PLAX 2D LVIDd:         6.20 cm   Diastology LVIDs:         3.20 cm   LV e' medial:    7.18 cm/s LV PW:          1.40 cm   LV E/e' medial:  12.0 LV IVS:        0.90 cm   LV e' lateral:   11.30 cm/s LVOT diam:     2.60 cm   LV E/e' lateral: 7.7 LV SV:         165 LV SV Index:   57        2D Longitudinal Strain LVOT Area:     5.31 cm  2D Strain GLS (A4C):   -19.1 %                          2D Strain GLS (A3C):   -16.4 %                          2D Strain GLS (A2C):   -19.5 %                          2D Strain GLS Avg:     -18.3 % RIGHT VENTRICLE RV Basal diam:  4.95 cm RV Mid diam:    4.30 cm RV S prime:     17.90 cm/s TAPSE (M-mode): 2.9 cm LEFT ATRIUM              Index        RIGHT ATRIUM           Index LA diam:  4.50 cm  1.55 cm/m   RA Pressure: 3.00 mmHg LA Vol (A2C):   146.0 ml 50.43 ml/m  RA Area:     26.80 cm LA Vol (A4C):   85.7 ml  29.60 ml/m  RA Volume:   91.30 ml  31.54 ml/m LA Biplane Vol: 117.0 ml 40.42 ml/m  AORTIC VALVE AV Area (Vmax):    3.69 cm AV Area (Vmean):   3.75 cm AV Area (VTI):     4.06 cm AV Vmax:           211.75 cm/s AV Vmean:          143.750 cm/s AV VTI:            0.407 m AV Peak Grad:      17.9 mmHg AV Mean Grad:      9.8 mmHg LVOT Vmax:         147.33 cm/s LVOT Vmean:        101.667 cm/s LVOT VTI:          0.311 m LVOT/AV VTI ratio: 0.76  AORTA Ao Root diam: 3.30 cm Ao Asc diam:  3.60 cm MITRAL VALVE               TRICUSPID VALVE MV Area (PHT)  cm         Estimated RAP:  3.00 mmHg MV Decel Time: 229 msec MV E velocity: 86.48 cm/s  SHUNTS MV A velocity: 88.55 cm/s  Systemic VTI:  0.31 m MV E/A ratio:  0.98        Systemic Diam: 2.60 cm Wilbert Bihari MD Electronically signed by Wilbert Bihari MD Signature Date/Time: 05/24/2024/9:24:09 AM    Final    CARDIAC CATHETERIZATION Result Date: 05/18/2024 Conclusions: 70% ostial/proximal RCA stenosis that improves to 30% with intracoronary nitroglycerin  suggesting a significant component of vasospasm.  Otherwise, no angiographically significant coronary artery disease. Upper normal to mildly elevated left heart filling pressures (PCWP 17,  LVEDP 18 mmHg). Moderately elevated right heart filling pressures (mean RA 13, RV 43/14 mmHg). Mild pulmonary hypertension (PA 40/19, mean 26 mmHg). Normal Fick cardiac output/index (CO 8.2 L/min, CI 2.8 L/min/m). Moderate aortic valve/LVOT peak to peak gradient (24 mmHg). Recommendations: Continue medical therapy and risk factor modification to prevent progression of CAD.  Continue isosorbide  mononitrate and amlodipine  for treatment of possible component of coronary vasospasm. Proceed with echocardiogram for further assessment of possible LVOT/aortic valve gradient.  If echo is inconclusive, cardiac MRI may be helpful. Maintain net even to slightly negative fluid balance. Avoid nephrotoxic agents, including NSAIDs. Lonni Hanson, MD [facility]  NM Myocar Multi W/Spect Marisela Motion / EF Result Date: 05/04/2024 Pharmacological myocardial perfusion imaging study with small region of ischemia in the basal to mid inferolateral wall and mid to distal inferior and inferoapical wall Unable to definitively exclude diaphragmatic attenuation artifact as attenuation correction images not available Normal wall motion, EF estimated at 56% No EKG changes concerning for ischemia at peak stress or in recovery. Moderate risk scan Signed, Velinda Lunger, MD, Ph.D [facility]   DG Chest 2 View Result Date: 04/27/2024 CLINICAL DATA:  Shortness of breath EXAM: CHEST - 2 VIEW COMPARISON:  Chest radiograph April 03, 2024, chest CT April 19, 2024 FINDINGS: The heart size and mediastinal contours are within normal limits. Linear scarring in left upper lobe. No new consolidation or nodule. Mild bilateral vascular congestion, increased to prior. The visualized skeletal structures are unremarkable. IMPRESSION: Bilateral mild vascular congestion. No new pulmonary consolidation or  nodule. Electronically Signed   By: Megan  Zare M.D.   On: 04/27/2024 14:47      ASSESSMENT/PLAN   43 year old pleasant African-American male seen today  for ongoing shortness of breath likely related to underlying reactive airways disease and COPD due to extensive smoking history in the setting of morbid obesity and deconditioned state with underlying signs and symptoms of chronic kidney disease stage III with diastolic dysfunction in the setting of diagnosis of kidney carcinoma, underlying diagnosis of OSA  Assessment & Plan Chronic obstructive pulmonary disease, unspecified COPD type (HCC) FEV1 59% predicted Continue to use albuterol  as needed Avoid Allergens and Irritants Avoid secondhand smoke Avoid SICK contacts Recommend  Masking  when appropriate Recommend Keep up-to-date with vaccinations No exacerbation at this time No evidence of heart failure at this time No evidence or signs of infection at this time No respiratory distress No fevers, chills, nausea, vomiting, diarrhea No evidence of lower extremity edema No evidence hemoptysis  OSA (obstructive sleep apnea) We will take care of your sleep apnea-you may need to bring your machine for further evaluation Tobacco abuse Smoking Assessment and Cessation Counseling Upon further questioning, Patient smokes 1/2 ppd I have advised patient to quit/stop smoking as soon as possible due to high risk for multiple medical problems Patient is willing to quit smoking  I have advised patient that we can assist and have options of Nicotine  replacement therapy. I also advised patient on behavioral therapy and can provide oral medication therapy in conjunction with the other therapies Follow up next Office visit  for assessment of smoking cessation Smoking cessation counseling advised for 4 minutes  Morbid obesity (HCC) Obesity -recommend significant weight loss -recommend changing diet  Deconditioned state -Recommend increased daily activity and exercise  Lung nodule Recommend follow up CT chest in 3 months   Cardiac dysfunction Follow-up cardiology Lasix  as prescribed  Kidney  carcinoma Follow-up interventional radiology cryosurgery ablation therapy performed Recommend CT chest to assess for metastatic disease  Follow-up bariatric surgery Follow-up evaluation   MEDICATION ADJUSTMENTS/LABS AND TESTS ORDERED: Recommend obtaining CT of the chest in 3 months Can use albuterol  as needed Follow-up with cardiology Follow-up with nephrology Follow-up with Dr. Philip interventional radiology for kidney cancer We will help with your sleep apnea machine and sleep apnea-please bring in your machine for assessment Follow-up with bariatric surgery evaluation Avoid Allergens and Irritants Avoid secondhand smoke Avoid SICK contacts Recommend  Masking  when appropriate Recommend Keep up-to-date with vaccinations  CURRENT MEDICATIONS REVIEWED AT LENGTH WITH PATIENT TODAY   Patient  satisfied with Plan of action and management. All questions answered   Follow up 3 months   I spent a total of 48 minutes dedicated to the care of this patient on the date of this encounter to include pre-visit review of records, face-to-face time with the patient discussing conditions above, post visit ordering of testing, clinical documentation with the electronic health record, making appropriate referrals as documented, and communicating necessary information to the patient's healthcare team.    The Patient requires high complexity decision making for assessment and support, frequent evaluation and titration of therapies, application of advanced monitoring technologies and extensive interpretation of multiple databases.  Patient satisfied with Plan of action and management. All questions answered    Nickolas Alm Cellar, M.D.  [facility]  Medical Director [facility] Medical Director [facility] Cardio-Pulmonary Department

## 2024-05-25 NOTE — Patient Instructions (Signed)
 Please stop smoking Recommend weight loss-recommend losing 10 pounds in the next 3 months  We discussed your breathing test-your lung function is approximately 50% of what it should be We will take care of your sleep apnea-you may need to bring your machine for further evaluation  For the spot on your lungs we will recommend a follow-up CT chest in 3 months  Follow-up with cardiology as scheduled  Continue to use albuterol  as needed  Avoid Allergens and Irritants Avoid secondhand smoke Avoid SICK contacts Recommend  Masking  when appropriate Recommend Keep up-to-date with vaccinations

## 2024-05-25 NOTE — Assessment & Plan Note (Signed)
 Obesity -recommend significant weight loss -recommend changing diet  Deconditioned state -Recommend increased daily activity and exercise

## 2024-05-25 NOTE — Assessment & Plan Note (Signed)
 We will take care of your sleep apnea-you may need to bring your machine for further evaluation

## 2024-05-29 ENCOUNTER — Other Ambulatory Visit: Payer: Self-pay | Admitting: Internal Medicine

## 2024-05-29 DIAGNOSIS — J449 Chronic obstructive pulmonary disease, unspecified: Secondary | ICD-10-CM

## 2024-05-29 MED ORDER — ALBUTEROL SULFATE HFA 108 (90 BASE) MCG/ACT IN AERS
2.0000 | INHALATION_SPRAY | Freq: Four times a day (QID) | RESPIRATORY_TRACT | 10 refills | Status: AC | PRN
Start: 2024-05-29 — End: ?

## 2024-05-29 NOTE — Progress Notes (Signed)
 Heart squeeze is noted at 60 to 65%, there is mild enlargement noted in the left ventricle likely related to high blood pressure.  Left atrium is moderately dilated.  Calcification of the aortic valve is noted without narrowing or aortic stenosis.  Will continue to monitor this with surveillance studies.

## 2024-05-29 NOTE — Progress Notes (Signed)
 Albuterol ordered.

## 2024-06-01 ENCOUNTER — Ambulatory Visit: Attending: Cardiology | Admitting: Cardiology

## 2024-06-01 ENCOUNTER — Encounter: Payer: Self-pay | Admitting: Cardiology

## 2024-06-01 VITALS — BP 152/86 | HR 73 | Ht 73.0 in | Wt 393.8 lb

## 2024-06-01 DIAGNOSIS — E669 Obesity, unspecified: Secondary | ICD-10-CM

## 2024-06-01 DIAGNOSIS — R600 Localized edema: Secondary | ICD-10-CM | POA: Diagnosis not present

## 2024-06-01 DIAGNOSIS — I5032 Chronic diastolic (congestive) heart failure: Secondary | ICD-10-CM | POA: Diagnosis not present

## 2024-06-01 DIAGNOSIS — F17200 Nicotine dependence, unspecified, uncomplicated: Secondary | ICD-10-CM

## 2024-06-01 DIAGNOSIS — Z79899 Other long term (current) drug therapy: Secondary | ICD-10-CM | POA: Diagnosis not present

## 2024-06-01 DIAGNOSIS — I10 Essential (primary) hypertension: Secondary | ICD-10-CM

## 2024-06-01 DIAGNOSIS — N1832 Chronic kidney disease, stage 3b: Secondary | ICD-10-CM | POA: Diagnosis not present

## 2024-06-01 DIAGNOSIS — E782 Mixed hyperlipidemia: Secondary | ICD-10-CM

## 2024-06-01 DIAGNOSIS — G4733 Obstructive sleep apnea (adult) (pediatric): Secondary | ICD-10-CM

## 2024-06-01 MED ORDER — HYDRALAZINE HCL 100 MG PO TABS: 100.0000 mg | ORAL_TABLET | Freq: Two times a day (BID) | ORAL | Status: DC

## 2024-06-01 NOTE — Patient Instructions (Signed)
 Medication Instructions:  Test claim being run for Ozempic  and Zepbound,  *If you need a refill on your cardiac medications before your next appointment, please call your pharmacy*  Lab Work: Your provider would like for you to have following labs drawn today BMP.   If you have labs (blood work) drawn today and your tests are completely normal, you will receive your results only by: MyChart Message (if you have MyChart) OR A paper copy in the mail If you have any lab test that is abnormal or we need to change your treatment, we will call you to review the results.  Testing/Procedures: No test ordered today   Follow-Up: At [facility], you and your health needs are our priority.  As part of our continuing mission to provide you with exceptional heart care, our providers are all part of one team.  This team includes your primary Cardiologist (physician) and Advanced Practice Providers or APPs (Physician Assistants and Nurse Practitioners) who all work together to provide you with the care you need, when you need it.  Your next appointment:   3 month(s)  Provider:   You may see one of the following Advanced Practice Providers on your designated Care Team:   Lonni Meager, NP Lesley Maffucci, PA-C Bernardino Bring, PA-C Cadence [place], PA-C Tylene Lunch, NP Barnie Hila, NP

## 2024-06-01 NOTE — Progress Notes (Signed)
 Cardiology Office Note   Date:  06/01/2024  ID:  Calvin Bailey, DOB Feb 10, 1981, MRN [id number] PCP: de Bailey, Calvin PARAS, MD  [facility] Providers Cardiologist:  Dub Huntsman, DO     History of Present Illness Calvin Bailey is a 43 y.o. male with a past medical history of hypertension, hyperlipidemia, OSA, CKD, superobesity, tobacco abuse, chronic HFpEF, chronic chest pain, sleep apnea, who presents today for follow-up.   Diagnosed with hypertension in his 28s.  Has been difficult to control previous difficulties with compliance.  01/2022 CT angio of the head and neck with no significant carotid or vertebral stenosis.  Previously been followed with hypertensive clinic as well as seen the Pharm.D. team.  Presented to [facility] in February 2024 with complaints of chest pain and hypertensive urgency.  Was feeling persistent chest pain in the right side of his chest while he was at work.  Chest pain had been related to hypertensive urgency.  Had chest pain 11/13/2022 with no PE on CT of the chest.  Blood pressure was 172/29.  Serum creatinine 1.61 with BUN of 29 and GFR 54, high-sensitivity troponin 12, EKG without ischemic changes.  Started on Imdur  and echocardiogram was ordered.  And revealed an LVEF of 60-65%, normal LV function, no RWMA, mild concentric LVH, normal RV systolic function, mild aortic valve regurgitation, no significant change from prior study.  It was recommended he have a coronary CTA as an outpatient.  Evaluated in urgent care 01/25/23 with complaints of a headache for 2 days.  Concerns if it was in the right temple area was swollen.  He had surgery upcoming soon for renal mass and is worried about a blood clot or cancer that had spread.  Blood pressure was much improved to 147/83.  He was treated with Tylenol  for his headache.  He was subsequently discharged.  He was scheduled for Lexiscan  Myoview  to complete his ischemic workup from previous hospitalization.   Coronary CTA was deferred due to elevated serum creatinine.  Lexiscan  Myoview  was considered low risk, negative for ischemia or infarction, EF 55 to 65%.  He was last seen in clinic 12/20/2023 at that time he was planning on participating in a digital smoking cessation program  He was evaluated in the ED on 12/19/2023 in the setting of shortness of breath.  He was noted to have mild bilateral lower extremity edema.  BNP was normal.  EKG was without acute changes, troponin was negative, D-dimer was negative.  He was treated with nebulizer and Solu-Medrol  with improvement in his symptoms and was discharged home in stable condition.  There were no medication changes that were made or further testing that was ordered at that time.  Multiple emergency department visits for varying complaints of chest and abdominal pain. Work-ups were unrevealing and he was discharged home.  He was seen in clinic 04/04/2024 with continued shortness of breath, weight gain, dyspnea exertion, peripheral edema.  He had follow-up with pulmonary and nephrology.  He had recent been evaluated emergency department the day before with complaints of chest pain with negative troponins.  He was scheduled for today Lexiscan  to rule out ischemic causes.  He was evaluated in the emergency department at [facility] on 04/28/2019 with complaints of shortness of breath abdominal pain chest pain.  He complained of shortness of breath and chest tightness for approximately month.  He was in the ED with no obvious distress and normal breathing pattern.  Workup was unrevealing and he was  discharged home with furosemide  20 mg daily.   He was last seen in clinic 05/09/2024 at that time continued to have shortness of breath and atypical chest discomfort.  With recently undergoing a Lexiscan  Myoview  concerning for ischemia he was scheduled for right and left heart catheterization.  Amlodipine  was increased to 5 mg twice daily for suboptimally controlled blood  pressure.  He was started on SGLT2 inhibitor therapy and scheduled for an updated echocardiogram.  He returns to clinic today stating overall he has been doing well from a cardiac perspective.  He denies any recurrent chest pain.  Continues to have shortness of breath.  States that he followed back up with pulmonary and was advised this time that is likely issues with his lungs.  He has been continued on his current medications and states he is working on his compliance.  States he is compliant with his CPAP.  Denies any recent hospitalizations or visits to the emergency department.  States that he also has upcoming appointment with nephrology.  He has several questions today related to needing to work on his weight loss and is curious if he would qualify again for Ozempic  or Zepbound therapy.  ROS: 10 point review of systems has been reviewed and considered negative exception was been listed in the HPI  Studies Reviewed EKG Interpretation Date/Time:  Friday June 01 2024 11:43:14 EDT Ventricular Rate:  73 PR Interval:  190 QRS Duration:  104 QT Interval:  382 QTC Calculation: 420 R Axis:   14  Text Interpretation: Normal sinus rhythm Incomplete right bundle branch block When compared with ECG of 27-Apr-2024 13:57, No significant change since last tracing Confirmed by Gerard Frederick ([id number]) on 06/01/2024 2:36:27 PM    2D echo 05/24/2024 1. Left ventricular ejection fraction, by estimation, is 60 to 65%. The  left ventricle has normal function. Left ventricular endocardial border  not optimally defined to evaluate regional wall motion. The left  ventricular internal cavity size was mildly  dilated. Left ventricular diastolic parameters are consistent with Grade  II diastolic dysfunction (pseudonormalization). The average left  ventricular global longitudinal strain is -18.3 %. The global longitudinal  strain is normal.   2. Right ventricular systolic function is normal. The right  ventricular  size is moderately enlarged. Tricuspid regurgitation signal is inadequate  for assessing PA pressure.   3. Left atrial size was moderately dilated.   4. The mitral valve is normal in structure. No evidence of mitral valve  regurgitation. No evidence of mitral stenosis.   5. The aortic valve is tricuspid. Aortic valve regurgitation is trivial.  Aortic valve sclerosis/calcification is present, without any evidence of  aortic stenosis. Aortic valve area, by VTI measures 4.06 cm. Aortic valve  mean gradient measures 9.8 mmHg.   Aortic valve Vmax measures 2.12 m/s.   6. The inferior vena cava is normal in size with greater than 50%  respiratory variability, suggesting right atrial pressure of 3 mmHg.   R/LHC 05/18/2024 Conclusions: 70% ostial/proximal RCA stenosis that improves to 30% with intracoronary nitroglycerin  suggesting a significant component of vasospasm.  Otherwise, no angiographically significant coronary artery disease. Upper normal to mildly elevated left heart filling pressures (PCWP 17, LVEDP 18 mmHg). Moderately elevated right heart filling pressures (mean RA 13, RV 43/14 mmHg). Mild pulmonary hypertension (PA 40/19, mean 26 mmHg). Normal Fick cardiac output/index (CO 8.2 L/min, CI 2.8 L/min/m). Moderate aortic valve/LVOT peak to peak gradient (24 mmHg).   Recommendations: Continue medical therapy and risk factor modification to  prevent progression of CAD.  Continue isosorbide  mononitrate and amlodipine  for treatment of possible component of coronary vasospasm. Proceed with echocardiogram for further assessment of possible LVOT/aortic valve gradient.  If echo is inconclusive, cardiac MRI may be helpful. Maintain net even to slightly negative fluid balance. Avoid nephrotoxic agents, including NSAIDs.  Lexiscan  MPI 05/04/2024 Pharmacological myocardial perfusion imaging study with small region of ischemia in the basal to mid inferolateral wall and mid to distal  inferior and inferoapical wall Unable to definitively exclude diaphragmatic attenuation artifact as attenuation correction images not available Normal wall motion, EF estimated at 56% No EKG changes concerning for ischemia at peak stress or in recovery. Moderate risk scan   Lexiscan  MPI 01/17/2023   The study is normal. The study is low risk.   No ST deviation was noted.   LV perfusion is normal. There is no evidence of ischemia. There is no evidence of infarction.   Left ventricular function is normal. The left ventricular ejection fraction is normal (55-65%). End diastolic cavity size is normal.   This was supposed to be a 2-day study.  However, stress images are normal and does not need for rest imaging   TTE 11/20/22 1. Left ventricular ejection fraction, by estimation, is 60 to 65%. The  left ventricle has normal function. The left ventricle has no regional  wall motion abnormalities. There is mild concentric left ventricular  hypertrophy. Left ventricular diastolic  parameters were normal.   2. Right ventricular systolic function is normal. The right ventricular  size is normal. There is normal pulmonary artery systolic pressure.   3. The mitral valve is normal in structure. Trivial mitral valve  regurgitation. No evidence of mitral stenosis.   4. The aortic valve is tricuspid. Aortic valve regurgitation is mild. No  aortic stenosis is present.   5. The inferior vena cava is normal in size with greater than 50%  respiratory variability, suggesting right atrial pressure of 3 mmHg.    Risk Assessment/Calculations   HYPERTENSION CONTROL Vitals:   06/01/24 1133 06/01/24 1138  BP: (!) 182/89 (!) 152/86    The patient's blood pressure is elevated above target today.  In order to address the patient's elevated BP: Blood pressure will be monitored at home to determine if medication changes need to be made. (Patient stated he does have his medication prior to arrival for his  appointment)          Physical Exam VS:  BP (!) 152/86 (BP Location: Right Arm, Patient Position: Sitting, Cuff Size: Large)   Pulse 73   Ht 6' 1 (1.854 m)   Wt (!) 393 lb 12.8 oz (178.6 kg)   SpO2 98%   BMI 51.96 kg/m        Wt Readings from Last 3 Encounters:  06/01/24 (!) 393 lb 12.8 oz (178.6 kg)  05/25/24 (!) 392 lb 6.4 oz (178 kg)  05/18/24 (!) 402 lb (182.3 kg)    GEN: Well nourished, well developed in no acute distress NECK: Unable to determine JVD due to body habitus; No carotid bruits CARDIAC: RRR, no murmurs, rubs, gallops RESPIRATORY:  Clear to auscultation without rales, wheezing or rhonchi  ABDOMEN: Soft, non-tender, obese, non-distended EXTREMITIES: 1+ peripheral edema to the bilateral lower extremities; No deformity   ASSESSMENT AND PLAN Chronic HFpEF with chronic shortness of breath and peripheral edema.  Difficult to determine fluid status due to body habitus.  He was recently changed from Farxiga  to Jardiance  due to insurance coverage and has  been tolerating the medication well.  He is also continued on valsartan  60 mg twice daily, spironolactone  25 mg daily, furosemide  20 mg daily as needed for fluid and edema, and Imdur  and hydralazine .  Recent echocardiogram revealed LVEF of 60 to 65%, Mild enlargement noted in the left ventricle likely related to blood pressure and left atrium is moderately dilated the right ventricle is moderately dilated and there calcification of the aortic valve is noted without narrowing or aortic stenosis. Recent right heart catheterization revealed mild pulmonary hypertension, moderately elevated right heart filling pressures, upper normal to mildly elevated left heart filling pressure with an LVEDP of 18 mmHg.  Discussed cardiac MRI today to have further assessment LVOT/aortic valve gradient.  Will schedule on return as he has upcoming testing already scheduled with pulmonary.  EKG today reveals sinus rhythm with a rate of 73 with an  incomplete right bundle branch block with no other significant changes.  Primary hypertension with a blood pressure of 182/89 and recheck 152/86.  He has just recently taken all of his medications prior to his arrival at his appointment.  He is continued on amlodipine  5 mg twice daily, furosemide  20 mg, hydralazine  100 mg twice daily, Imdur  30 mg daily, labetalol  300 mg twice daily, spironolactone  25 mg daily, valsartan  160 mg twice daily.  He has been encouraged to continue to monitor his pressure 1 to 2 hours postmedication administration at home as well.  CKD stage IIIb with baseline serum creatinine 1.6/1.7.  Encouraged to continue to avoid NSAIDs.  Ongoing management per nephrology.  OSA stating that he has been compliant with CPAP therapy.  Superobesity with a BMI of 51.96.  Makes pregnancies and treatment difficult.  He continues to work on his weight loss journey.  We are running through his insurance today to determine which GLP-1 would be covered either Zepbound or Ozempic .  Depending on which medication is covered one of the other can be prescribed to assist with weight loss.  Tobacco abuse with total cessation continue to be recommended.       Dispo: Patient to  return to clinic to see MD/APP in 3 months or sooner if needed for further evaluation  Signed, Sumit Branham, NP

## 2024-06-02 LAB — BASIC METABOLIC PANEL WITH GFR
BUN/Creatinine Ratio: 11 (ref 9–20)
BUN: 19 mg/dL (ref 6–24)
CO2: 21 mmol/L (ref 20–29)
Calcium: 9.5 mg/dL (ref 8.7–10.2)
Chloride: 105 mmol/L (ref 96–106)
Creatinine, Ser: 1.76 mg/dL — ABNORMAL HIGH (ref 0.76–1.27)
Glucose: 95 mg/dL (ref 70–99)
Potassium: 5 mmol/L (ref 3.5–5.2)
Sodium: 140 mmol/L (ref 134–144)
eGFR: 48 mL/min/1.73 — ABNORMAL LOW (ref >59)

## 2024-06-04 ENCOUNTER — Telehealth: Payer: Self-pay | Admitting: Pharmacy Technician

## 2024-06-04 ENCOUNTER — Ambulatory Visit: Payer: Self-pay | Admitting: Cardiology

## 2024-06-04 ENCOUNTER — Other Ambulatory Visit (HOSPITAL_COMMUNITY): Payer: Self-pay

## 2024-06-04 NOTE — Progress Notes (Signed)
 Kidney function remained stable post heart catheterization.  Continue current medication regimen unchanged at this time.

## 2024-06-04 NOTE — Addendum Note (Signed)
 Addended by: Ritter Helsley D on: 06/04/2024 03:31 PM   Modules accepted: Orders

## 2024-06-04 NOTE — Telephone Encounter (Signed)
 Pharmacy Patient Advocate Encounter  Received notification from [facility] that Prior Authorization for zepbound has been APPROVED from 06/04/24 to 12/02/24. Ran test claim, Copay is $0.00- one month. This test claim was processed through [facility]- copay amounts may vary at other pharmacies due to pharmacy/plan contracts, or as the patient moves through the different stages of their insurance plan.   PA #/Case ID/Reference #: [id number]

## 2024-06-04 NOTE — Telephone Encounter (Signed)
 Pharmacy Patient Advocate Encounter   Received notification from Pt Calls Messages that prior authorization for zepbound is required/requested.   Insurance verification completed.   The patient is insured through [employer] .   Per test claim: PA required; PA submitted to above mentioned insurance via Latent Key/confirmation #/EOC [id number] Status is pending

## 2024-06-08 ENCOUNTER — Ambulatory Visit (HOSPITAL_BASED_OUTPATIENT_CLINIC_OR_DEPARTMENT_OTHER): Admitting: Family Medicine

## 2024-06-11 ENCOUNTER — Other Ambulatory Visit: Payer: Self-pay

## 2024-06-11 ENCOUNTER — Emergency Department (HOSPITAL_BASED_OUTPATIENT_CLINIC_OR_DEPARTMENT_OTHER)

## 2024-06-11 ENCOUNTER — Inpatient Hospital Stay (HOSPITAL_BASED_OUTPATIENT_CLINIC_OR_DEPARTMENT_OTHER)
Admission: EM | Admit: 2024-06-11 | Discharge: 2024-06-13 | DRG: 280 | Disposition: A | Discharge status: Home / Self Care | Attending: Cardiology | Admitting: Cardiology

## 2024-06-11 ENCOUNTER — Inpatient Hospital Stay (HOSPITAL_COMMUNITY)
Admission: EM | Disposition: A | Discharge status: Home / Self Care | Payer: Self-pay | Source: Home / Self Care | Attending: Cardiology

## 2024-06-11 ENCOUNTER — Encounter (HOSPITAL_COMMUNITY): Payer: Self-pay | Admitting: Cardiovascular Disease

## 2024-06-11 DIAGNOSIS — Z6841 Body Mass Index (BMI) 40.0 and over, adult: Secondary | ICD-10-CM | POA: Diagnosis not present

## 2024-06-11 DIAGNOSIS — Z823 Family history of stroke: Secondary | ICD-10-CM | POA: Diagnosis not present

## 2024-06-11 DIAGNOSIS — Q2112 Patent foramen ovale: Secondary | ICD-10-CM | POA: Diagnosis not present

## 2024-06-11 DIAGNOSIS — N1832 Chronic kidney disease, stage 3b: Secondary | ICD-10-CM | POA: Diagnosis present

## 2024-06-11 DIAGNOSIS — Z7984 Long term (current) use of oral hypoglycemic drugs: Secondary | ICD-10-CM | POA: Diagnosis not present

## 2024-06-11 DIAGNOSIS — I130 Hypertensive heart and chronic kidney disease with heart failure and stage 1 through stage 4 chronic kidney disease, or unspecified chronic kidney disease: Secondary | ICD-10-CM | POA: Diagnosis present

## 2024-06-11 DIAGNOSIS — Z888 Allergy status to other drugs, medicaments and biological substances status: Secondary | ICD-10-CM

## 2024-06-11 DIAGNOSIS — I2119 ST elevation (STEMI) myocardial infarction involving other coronary artery of inferior wall: Secondary | ICD-10-CM | POA: Diagnosis not present

## 2024-06-11 DIAGNOSIS — I2102 ST elevation (STEMI) myocardial infarction involving left anterior descending coronary artery: Secondary | ICD-10-CM | POA: Diagnosis not present

## 2024-06-11 DIAGNOSIS — I2510 Atherosclerotic heart disease of native coronary artery without angina pectoris: Secondary | ICD-10-CM

## 2024-06-11 DIAGNOSIS — I2542 Coronary artery dissection: Secondary | ICD-10-CM | POA: Diagnosis present

## 2024-06-11 DIAGNOSIS — Z8249 Family history of ischemic heart disease and other diseases of the circulatory system: Secondary | ICD-10-CM

## 2024-06-11 DIAGNOSIS — Z833 Family history of diabetes mellitus: Secondary | ICD-10-CM

## 2024-06-11 DIAGNOSIS — E662 Morbid (severe) obesity with alveolar hypoventilation: Secondary | ICD-10-CM | POA: Diagnosis present

## 2024-06-11 DIAGNOSIS — F17210 Nicotine dependence, cigarettes, uncomplicated: Secondary | ICD-10-CM | POA: Diagnosis present

## 2024-06-11 DIAGNOSIS — Z716 Tobacco abuse counseling: Secondary | ICD-10-CM | POA: Diagnosis not present

## 2024-06-11 DIAGNOSIS — I5032 Chronic diastolic (congestive) heart failure: Secondary | ICD-10-CM | POA: Diagnosis present

## 2024-06-11 DIAGNOSIS — Z820 Family history of epilepsy and other diseases of the nervous system: Secondary | ICD-10-CM

## 2024-06-11 DIAGNOSIS — Z79899 Other long term (current) drug therapy: Secondary | ICD-10-CM | POA: Diagnosis not present

## 2024-06-11 DIAGNOSIS — Z743 Need for continuous supervision: Secondary | ICD-10-CM | POA: Diagnosis not present

## 2024-06-11 DIAGNOSIS — I10 Essential (primary) hypertension: Secondary | ICD-10-CM | POA: Diagnosis not present

## 2024-06-11 DIAGNOSIS — I2489 Other forms of acute ischemic heart disease: Secondary | ICD-10-CM | POA: Diagnosis not present

## 2024-06-11 DIAGNOSIS — Z560 Unemployment, unspecified: Secondary | ICD-10-CM | POA: Diagnosis not present

## 2024-06-11 DIAGNOSIS — I160 Hypertensive urgency: Secondary | ICD-10-CM | POA: Diagnosis present

## 2024-06-11 DIAGNOSIS — Z720 Tobacco use: Secondary | ICD-10-CM | POA: Diagnosis not present

## 2024-06-11 DIAGNOSIS — Z7982 Long term (current) use of aspirin: Secondary | ICD-10-CM

## 2024-06-11 DIAGNOSIS — I213 ST elevation (STEMI) myocardial infarction of unspecified site: Principal | ICD-10-CM

## 2024-06-11 DIAGNOSIS — R079 Chest pain, unspecified: Secondary | ICD-10-CM | POA: Diagnosis not present

## 2024-06-11 HISTORY — PX: LEFT HEART CATH AND CORONARY ANGIOGRAPHY: CATH118249

## 2024-06-11 HISTORY — PX: CORONARY/GRAFT ACUTE MI REVASCULARIZATION: CATH118305

## 2024-06-11 LAB — MRSA NEXT GEN BY PCR, NASAL: MRSA by PCR Next Gen: NOT DETECTED

## 2024-06-11 LAB — LIPID PANEL
Cholesterol: 133 mg/dL (ref 0–200)
HDL: 31 mg/dL — ABNORMAL LOW (ref >40)
LDL Cholesterol: 43 mg/dL (ref 0–99)
Total CHOL/HDL Ratio: 4.3 ratio
Triglycerides: 294 mg/dL — ABNORMAL HIGH (ref <150)
VLDL: 59 mg/dL — ABNORMAL HIGH (ref 0–40)

## 2024-06-11 LAB — CBC WITH DIFFERENTIAL/PLATELET
Abs Immature Granulocytes: 0.02 K/uL (ref 0.00–0.07)
Basophils Absolute: 0 K/uL (ref 0.0–0.1)
Basophils Relative: 0 %
Eosinophils Absolute: 0.3 K/uL (ref 0.0–0.5)
Eosinophils Relative: 4 %
HCT: 47 % (ref 39.0–52.0)
Hemoglobin: 15.7 g/dL (ref 13.0–17.0)
Immature Granulocytes: 0 %
Lymphocytes Relative: 20 %
Lymphs Abs: 2 K/uL (ref 0.7–4.0)
MCH: 28.6 pg (ref 26.0–34.0)
MCHC: 33.4 g/dL (ref 30.0–36.0)
MCV: 85.6 fL (ref 80.0–100.0)
Monocytes Absolute: 0.8 K/uL (ref 0.1–1.0)
Monocytes Relative: 9 %
Neutro Abs: 6.5 K/uL (ref 1.7–7.7)
Neutrophils Relative %: 67 %
Platelets: 260 K/uL (ref 150–400)
RBC: 5.49 MIL/uL (ref 4.22–5.81)
RDW: 12.8 % (ref 11.5–15.5)
WBC: 9.7 K/uL (ref 4.0–10.5)
nRBC: 0 % (ref 0.0–0.2)

## 2024-06-11 LAB — COMPREHENSIVE METABOLIC PANEL WITH GFR
ALT: 21 U/L (ref 0–44)
AST: 22 U/L (ref 15–41)
Albumin: 4.6 g/dL (ref 3.5–5.0)
Alkaline Phosphatase: 116 U/L (ref 38–126)
Anion gap: 14 (ref 5–15)
BUN: 21 mg/dL — ABNORMAL HIGH (ref 6–20)
CO2: 23 mmol/L (ref 22–32)
Calcium: 10.2 mg/dL (ref 8.9–10.3)
Chloride: 101 mmol/L (ref 98–111)
Creatinine, Ser: 1.73 mg/dL — ABNORMAL HIGH (ref 0.61–1.24)
GFR, Estimated: 49 mL/min — ABNORMAL LOW (ref >60)
Glucose, Bld: 100 mg/dL — ABNORMAL HIGH (ref 70–99)
Potassium: 4.5 mmol/L (ref 3.5–5.1)
Sodium: 138 mmol/L (ref 135–145)
Total Bilirubin: 0.4 mg/dL (ref 0.0–1.2)
Total Protein: 8.3 g/dL — ABNORMAL HIGH (ref 6.5–8.1)

## 2024-06-11 LAB — PROTIME-INR
INR: 1 (ref 0.8–1.2)
Prothrombin Time: 13.4 s (ref 11.4–15.2)

## 2024-06-11 LAB — TROPONIN T, HIGH SENSITIVITY: Troponin T High Sensitivity: 17 ng/L (ref 0–19)

## 2024-06-11 LAB — CREATININE, SERUM
Creatinine, Ser: 1.6 mg/dL — ABNORMAL HIGH (ref 0.61–1.24)
GFR, Estimated: 54 mL/min — ABNORMAL LOW (ref >60)

## 2024-06-11 LAB — HEMOGLOBIN A1C
Hgb A1c MFr Bld: 5.3 % (ref 4.8–5.6)
Mean Plasma Glucose: 105.41 mg/dL

## 2024-06-11 LAB — LACTIC ACID, PLASMA: Lactic Acid, Venous: 1.1 mmol/L (ref 0.5–1.9)

## 2024-06-11 LAB — APTT: aPTT: 32 s (ref 24–36)

## 2024-06-11 SURGERY — CORONARY/GRAFT ACUTE MI REVASCULARIZATION
Anesthesia: LOCAL

## 2024-06-11 MED ORDER — MORPHINE SULFATE (PF) 2 MG/ML IV SOLN: 2.0000 mg | INTRAVENOUS | Status: DC | PRN

## 2024-06-11 MED ORDER — ISOSORBIDE MONONITRATE ER 30 MG PO TB24
30.0000 mg | ORAL_TABLET | Freq: Every day | ORAL | Status: DC
Start: 2024-06-12 — End: 2024-06-12

## 2024-06-11 MED ORDER — LIDOCAINE HCL (PF) 1 % IJ SOLN
INTRAMUSCULAR | Status: AC
  Filled 2024-06-11: qty 30

## 2024-06-11 MED ORDER — LABETALOL HCL 5 MG/ML IV SOLN: 10.0000 mg | INTRAVENOUS | Status: AC | PRN

## 2024-06-11 MED ORDER — HYDRALAZINE HCL 50 MG PO TABS
100.0000 mg | ORAL_TABLET | Freq: Two times a day (BID) | ORAL | Status: DC
  Administered 2024-06-11 – 2024-06-13 (×4): 100 mg via ORAL
  Filled 2024-06-11 (×4): qty 2

## 2024-06-11 MED ORDER — VERAPAMIL HCL 2.5 MG/ML IV SOLN
INTRAVENOUS | Status: DC | PRN
  Administered 2024-06-11: 10 mL via INTRA_ARTERIAL

## 2024-06-11 MED ORDER — ORAL CARE MOUTH RINSE: 15.0000 mL | OROMUCOSAL | Status: DC | PRN

## 2024-06-11 MED ORDER — NITROGLYCERIN 1 MG/10 ML FOR IR/CATH LAB
INTRA_ARTERIAL | Status: DC | PRN
  Administered 2024-06-11 (×3): 300 ug via INTRACORONARY

## 2024-06-11 MED ORDER — ENOXAPARIN SODIUM 40 MG/0.4ML IJ SOSY
40.0000 mg | PREFILLED_SYRINGE | INTRAMUSCULAR | Status: DC
  Administered 2024-06-12 – 2024-06-13 (×2): 40 mg via SUBCUTANEOUS
  Filled 2024-06-11 (×2): qty 0.4

## 2024-06-11 MED ORDER — ONDANSETRON HCL 4 MG/2ML IJ SOLN: 4.0000 mg | Freq: Four times a day (QID) | INTRAMUSCULAR | Status: DC | PRN

## 2024-06-11 MED ORDER — NITROGLYCERIN 1 MG/10 ML FOR IR/CATH LAB
INTRA_ARTERIAL | Status: AC
  Filled 2024-06-11: qty 10

## 2024-06-11 MED ORDER — FREE WATER
500.0000 mL | Freq: Once | Status: AC
  Administered 2024-06-11: 500 mL via ORAL

## 2024-06-11 MED ORDER — ASPIRIN 81 MG PO TBEC
81.0000 mg | DELAYED_RELEASE_TABLET | Freq: Every day | ORAL | Status: DC
  Administered 2024-06-12: 81 mg via ORAL
  Filled 2024-06-11: qty 1

## 2024-06-11 MED ORDER — IRBESARTAN 150 MG PO TABS
150.0000 mg | ORAL_TABLET | Freq: Every day | ORAL | Status: DC
  Administered 2024-06-12 – 2024-06-13 (×2): 150 mg via ORAL
  Filled 2024-06-11 (×2): qty 1

## 2024-06-11 MED ORDER — ACETAMINOPHEN 325 MG PO TABS: 650.0000 mg | ORAL_TABLET | ORAL | Status: DC | PRN

## 2024-06-11 MED ORDER — HEPARIN SODIUM (PORCINE) 5000 UNIT/ML IJ SOLN
4000.0000 [IU] | Freq: Once | INTRAMUSCULAR | Status: AC
  Administered 2024-06-11: 4000 [IU] via INTRAVENOUS
  Filled 2024-06-11: qty 1

## 2024-06-11 MED ORDER — SODIUM CHLORIDE 0.9% FLUSH: 3.0000 mL | INTRAVENOUS | Status: DC | PRN

## 2024-06-11 MED ORDER — HEPARIN SODIUM (PORCINE) 1000 UNIT/ML IJ SOLN
INTRAMUSCULAR | Status: DC | PRN
  Administered 2024-06-11: 10000 [IU] via INTRAVENOUS

## 2024-06-11 MED ORDER — OXYCODONE HCL 5 MG PO TABS: 5.0000 mg | ORAL_TABLET | ORAL | Status: DC | PRN

## 2024-06-11 MED ORDER — SODIUM CHLORIDE 0.9 % IV SOLN: 250.0000 mL | INTRAVENOUS | Status: AC | PRN

## 2024-06-11 MED ORDER — HYDRALAZINE HCL 20 MG/ML IJ SOLN: 10.0000 mg | INTRAMUSCULAR | Status: AC | PRN

## 2024-06-11 MED ORDER — FENTANYL CITRATE (PF) 100 MCG/2ML IJ SOLN
INTRAMUSCULAR | Status: DC | PRN
  Administered 2024-06-11: 25 ug via INTRAVENOUS

## 2024-06-11 MED ORDER — VERAPAMIL HCL 2.5 MG/ML IV SOLN
INTRAVENOUS | Status: AC
  Filled 2024-06-11: qty 2

## 2024-06-11 MED ORDER — HEPARIN SODIUM (PORCINE) 1000 UNIT/ML IJ SOLN
INTRAMUSCULAR | Status: AC
  Filled 2024-06-11: qty 10

## 2024-06-11 MED ORDER — ASPIRIN 81 MG PO CHEW
324.0000 mg | CHEWABLE_TABLET | Freq: Once | ORAL | Status: AC
  Administered 2024-06-11: 324 mg via ORAL
  Filled 2024-06-11: qty 4

## 2024-06-11 MED ORDER — SODIUM CHLORIDE 0.9% FLUSH
3.0000 mL | Freq: Two times a day (BID) | INTRAVENOUS | Status: DC
Start: 2024-06-11 — End: 2024-06-13
  Administered 2024-06-11 – 2024-06-13 (×4): 3 mL via INTRAVENOUS

## 2024-06-11 MED ORDER — SODIUM CHLORIDE 0.9 % IV SOLN
INTRAVENOUS | Status: AC | PRN
  Administered 2024-06-11: 10 mL/h via INTRAVENOUS

## 2024-06-11 MED ORDER — NITROGLYCERIN 0.4 MG SL SUBL: 0.4000 mg | SUBLINGUAL_TABLET | SUBLINGUAL | Status: DC | PRN

## 2024-06-11 MED ORDER — FENTANYL CITRATE (PF) 100 MCG/2ML IJ SOLN
INTRAMUSCULAR | Status: AC
  Filled 2024-06-11: qty 2

## 2024-06-11 MED ORDER — SODIUM CHLORIDE 0.9 % IV SOLN: INTRAVENOUS | Status: AC

## 2024-06-11 MED ORDER — SPIRONOLACTONE 25 MG PO TABS
25.0000 mg | ORAL_TABLET | Freq: Every day | ORAL | Status: DC
  Administered 2024-06-12 – 2024-06-13 (×2): 25 mg via ORAL
  Filled 2024-06-11 (×2): qty 1

## 2024-06-11 MED ORDER — MIDAZOLAM HCL 2 MG/2ML IJ SOLN
INTRAMUSCULAR | Status: DC | PRN
  Administered 2024-06-11: 2 mg via INTRAVENOUS

## 2024-06-11 MED ORDER — AMLODIPINE BESYLATE 5 MG PO TABS
5.0000 mg | ORAL_TABLET | Freq: Two times a day (BID) | ORAL | Status: DC
Start: 2024-06-11 — End: 2024-06-12
  Administered 2024-06-11: 5 mg via ORAL
  Filled 2024-06-11: qty 1

## 2024-06-11 MED ORDER — HEPARIN (PORCINE) IN NACL 1000-0.9 UT/500ML-% IV SOLN
INTRAVENOUS | Status: DC | PRN
  Administered 2024-06-11 (×2): 500 mL

## 2024-06-11 MED ORDER — MIDAZOLAM HCL 2 MG/2ML IJ SOLN
INTRAMUSCULAR | Status: AC
  Filled 2024-06-11: qty 2

## 2024-06-11 MED ORDER — EMPAGLIFLOZIN 10 MG PO TABS
10.0000 mg | ORAL_TABLET | Freq: Every day | ORAL | Status: DC
  Administered 2024-06-12 – 2024-06-13 (×2): 10 mg via ORAL
  Filled 2024-06-11 (×2): qty 1

## 2024-06-11 MED ORDER — LABETALOL HCL 300 MG PO TABS
300.0000 mg | ORAL_TABLET | Freq: Two times a day (BID) | ORAL | Status: DC
  Administered 2024-06-11 – 2024-06-13 (×4): 300 mg via ORAL
  Filled 2024-06-11 (×5): qty 1

## 2024-06-11 MED ORDER — IOHEXOL 350 MG/ML SOLN
INTRAVENOUS | Status: DC | PRN
  Administered 2024-06-11: 55 mL

## 2024-06-11 SURGICAL SUPPLY — 9 items
CATH 5FR JL3.5 JR4 ANG PIG MP (CATHETERS) IMPLANT
DEVICE RAD COMP TR BAND LRG (VASCULAR PRODUCTS) IMPLANT
ELECT DEFIB PAD ADLT CADENCE (PAD) IMPLANT
GLIDESHEATH SLEND SS 6F .021 (SHEATH) IMPLANT
GUIDEWIRE INQWIRE 1.5J.035X260 (WIRE) IMPLANT
KIT SYRINGE INJ CVI SPIKEX1 (MISCELLANEOUS) IMPLANT
MAT PREVALON FULL STRYKER (MISCELLANEOUS) IMPLANT
PACK CARDIAC CATHETERIZATION (CUSTOM PROCEDURE TRAY) ×1 IMPLANT
SET ATX-X65L (MISCELLANEOUS) IMPLANT

## 2024-06-11 NOTE — ED Notes (Signed)
 MD in room

## 2024-06-11 NOTE — ED Notes (Signed)
Patient roomed

## 2024-06-11 NOTE — ED Notes (Signed)
 Placed on PADS.

## 2024-06-11 NOTE — ED Notes (Signed)
 Cath Lab called for report,

## 2024-06-11 NOTE — ED Provider Notes (Signed)
 [place] EMERGENCY DEPARTMENT AT [facility] Provider Note   CSN: [id number] Arrival date & time: 06/11/24  1807     Patient presents with: Chest Pain   Calvin Bailey is a 43 y.o. male.   HPI 42 year old male presents with acute chest pain.  About 20 minutes ago he was at his son's practice and developed acute chest pain.  Feels a get tightness and burning in the middle of his chest and radiates up into his neck.  Has felt a little short of breath on the drive over here.  Pain also radiates down his left arm.  No abdominal or back pain.  Has a prior history of CHF, smoking, hypertension, diastolic CHF.  Recently had a heart cath that showed likely vasospasm of his RCA.  Prior to Admission medications   Medication Sig Start Date End Date Taking? Authorizing Provider  acetaminophen  (TYLENOL ) 500 MG tablet Take 1 tablet (500 mg total) by mouth every 6 (six) hours as needed. 05/20/23   Horton, Charmaine FALCON, MD  albuterol  (VENTOLIN  HFA) 108 (90 Base) MCG/ACT inhaler Inhale 2 puffs into the lungs every 6 (six) hours as needed for wheezing or shortness of breath. 05/29/24   Kasa, Kurian, MD  allopurinol  (ZYLOPRIM ) 100 MG tablet TAKE 1 TABLET(100 MG) BY MOUTH DAILY Patient taking differently: Take 100 mg by mouth daily as needed (gout pain). 03/10/23   de [place], [name], MD  amLODipine  (NORVASC ) 5 MG tablet Take 1 tablet (5 mg total) by mouth 2 (two) times daily. 05/09/24 05/09/25  Gerard Frederick, NP  aspirin  EC 81 MG tablet Take 1 tablet (81 mg total) by mouth daily. Swallow whole. 05/18/24 05/18/25  End, Lonni, MD  colchicine  0.6 MG tablet Take 1 tablet (0.6 mg total) by mouth 2 (two) times daily. Patient taking differently: Take 0.6 mg by mouth 2 (two) times daily as needed (gout). 03/10/23   de [place], Quintin PARAS, MD  empagliflozin  (JARDIANCE ) 10 MG TABS tablet Take 1 tablet (10 mg total) by mouth daily before breakfast. 04/05/24   Gerard Frederick, NP  furosemide  (LASIX ) 20 MG tablet  Take 1 tablet (20 mg total) by mouth daily as needed for fluid or edema. 05/18/24   End, Lonni, MD  hydrALAZINE  (APRESOLINE ) 100 MG tablet Take 1 tablet (100 mg total) by mouth 2 (two) times daily. 06/01/24 06/01/25  Gerard Frederick, NP  isosorbide  mononitrate (IMDUR ) 30 MG 24 hr tablet Take 1 tablet (30 mg total) by mouth daily. 12/02/23   Tobb, Kardie, DO  labetalol  (NORMODYNE ) 300 MG tablet TAKE 1 TABLET(300 MG) BY MOUTH TWICE DAILY 09/12/23   Tobb, Kardie, DO  nitroGLYCERIN  (NITROSTAT ) 0.4 MG SL tablet Place 1 tablet (0.4 mg total) under the tongue every 5 (five) minutes as needed for chest pain. 05/18/24   End, Lonni, MD  spironolactone  (ALDACTONE ) 25 MG tablet TAKE 1 TABLET(25 MG) BY MOUTH DAILY 06/24/23   Tobb, Kardie, DO  valsartan  (DIOVAN ) 160 MG tablet TAKE 1 TABLET(160 MG) BY MOUTH TWICE DAILY 07/26/23   Tobb, Kardie, DO  LISINOPRIL -HYDROCHLOROTHIAZIDE  PO Take by mouth.  07/06/19  [provider]    Allergies: Hydrochlorothiazide  and Lisinopril     Review of Systems  Constitutional:  Negative for diaphoresis.  Respiratory:  Positive for shortness of breath.   Cardiovascular:  Positive for chest pain and leg swelling (chronic).  Gastrointestinal:  Negative for abdominal pain and vomiting.  Musculoskeletal:  Negative for back pain.    Updated Vital Signs BP (!) 191/113  Pulse 76   Temp 98.3 F (36.8 C)   Resp 19   Ht 6' 1 (1.854 m)   Wt (!) 172.4 kg   SpO2 99%   BMI 50.13 kg/m   Physical Exam Vitals and nursing note reviewed.  Constitutional:      Appearance: He is well-developed. He is obese. He is not diaphoretic.  HENT:     Head: Normocephalic and atraumatic.  Cardiovascular:     Rate and Rhythm: Normal rate and regular rhythm.     Pulses:          Radial pulses are 2+ on the left side.     Heart sounds: Normal heart sounds.  Pulmonary:     Effort: Pulmonary effort is normal.     Breath sounds: Normal breath sounds.  Abdominal:     Palpations:  Abdomen is soft.     Tenderness: There is no abdominal tenderness.  Skin:    General: Skin is warm and dry.  Neurological:     Mental Status: He is alert.     (all labs ordered are listed, but only abnormal results are displayed) Labs Reviewed  HEMOGLOBIN A1C  CBC WITH DIFFERENTIAL/PLATELET  PROTIME-INR  APTT  COMPREHENSIVE METABOLIC PANEL WITH GFR  LIPID PANEL  LACTIC ACID, PLASMA  TROPONIN T, HIGH SENSITIVITY    EKG: EKG Interpretation Date/Time:  Monday June 11 2024 18:14:06 EDT Ventricular Rate:  73 PR Interval:  192 QRS Duration:  100 QT Interval:  384 QTC Calculation: 423 R Axis:   36  Text Interpretation: ** Critical Test Result: STEMI Normal sinus rhythm ST elevation consider inferior injury or acute infarct ** ** ACUTE MI / STEMI ** ** Confirmed by Freddi Hamilton [phone]) on 06/11/2024 6:16:09 PM  Radiology: No results found.   .Critical Care  Performed by: Freddi Hamilton, MD Authorized by: Freddi Hamilton, MD   Critical care provider statement:    Critical care time (minutes):  30   Critical care time was exclusive of:  Separately billable procedures and treating other patients   Critical care was necessary to treat or prevent imminent or life-threatening deterioration of the following conditions:  Circulatory failure, cardiac failure and shock   Critical care was time spent personally by me on the following activities:  Development of treatment plan with patient or surrogate, discussions with consultants, evaluation of patient's response to treatment, examination of patient, ordering and review of laboratory studies, ordering and review of radiographic studies, ordering and performing treatments and interventions, pulse oximetry, re-evaluation of patient's condition and review of old charts    Medications Ordered in the ED  0.9 %  sodium chloride  infusion (has no administration in time range)  aspirin  chewable tablet 324 mg (324 mg Oral Given 06/11/24  1824)  heparin  injection 4,000 Units (4,000 Units Intravenous Given 06/11/24 1826)                                    Medical Decision Making Amount and/or Complexity of Data Reviewed Labs: ordered. Radiology: ordered. ECG/medicine tests: ordered and independent interpretation performed.    Details: STEMI  Risk OTC drugs. Prescription drug management. Decision regarding hospitalization.   Patient presents with chest pain.  ECG shows inferior STEMI.  He is pretty hypertensive, otherwise not in any distress.  He was given aspirin , IV heparin .  I discussed case with Dr. Wonda of cardiology, he agrees with emergent transfer for heart  catheterization.  Of note, patient did have a heart catheterization a few weeks ago that showed the RCA stenosis and probable spasm.  No back or abdominal pain, low suspicion for aortic dissection despite his quite elevated blood pressure.  Transferred with [facility] in stable condition.     Final diagnoses:  ST elevation myocardial infarction (STEMI), unspecified artery [facility])    ED Discharge Orders     None          Freddi Hamilton, MD 06/11/24 1842

## 2024-06-11 NOTE — ED Notes (Signed)
 Called Kim at [facility] to activate code [employer]

## 2024-06-11 NOTE — ED Triage Notes (Signed)
 Pt POV reporting L side chest pain that radiates to L arm, began 20 min ago, endorses SOB. Hx CHF.

## 2024-06-11 NOTE — H&P (Signed)
 Cardiology Admission History and Physical   Patient ID: MAXUM CASSARINO MRN: [id number]; DOB: 10-28-1980   Admission date: 06/11/2024  PCP:  de Bailey, Calvin J, MD   [facility] Providers Cardiologist:  Dub Huntsman, DO       Chief Complaint:  Chest pain  Patient Profile: Calvin Bailey is a 43 y.o. male with HTN and morbid obesity who is being seen 06/11/2024 for the evaluation of STEMI.  History of Present Illness: Mr. Calvin Bailey has a hx of nonobstructive CAD and now presents with an acute inferior STEMI. His chest pain began abruptly at 6pm tonight when he was helping his son put on shoulder pads for football practice. He describes a severe ache that started over the left chest and radiated to the jaw. There was associated shortness of breath. No diaphoresis or lightheadedness. He felt similar symptoms in the past but never this severe. His pain has improved but persists on arrival to the cath lab.   He underwent cardiac cath in August 2025 showing ostial RCA spasm, but no other significant CAD was noted. RHC showed elevated left and right heart diastolic filling pressures. He has been taking his antihypertensive medications as prescribed. No other complaints.    Past Medical History:  Diagnosis Date   Arthritis    Cancer (HCC)    right kidney   CHF (congestive heart failure) (HCC)    pt. denies   Chronic kidney disease    Dizziness 09/25/2019   GERD (gastroesophageal reflux disease)    HTN (hypertension)    Obesity hypoventilation syndrome (HCC) 04/08/2014   OSA (obstructive sleep apnea) 04/08/2014   PFO (patent foramen ovale)    Pneumonia    Sleep apnea    wears Cpap   Past Surgical History:  Procedure Laterality Date   IR RADIOLOGIST EVAL & MGMT  03/07/2024   RADIOLOGY WITH ANESTHESIA Right 02/16/2023   Procedure: RIGHT RENAL MASS CRYO ABLATION;  Surgeon: Philip Cornet, MD;  Location: [facility];  Service: Anesthesiology;  Laterality: Right;   RIGHT/LEFT HEART  CATH AND CORONARY ANGIOGRAPHY Bilateral 05/18/2024   Procedure: RIGHT/LEFT HEART CATH AND CORONARY ANGIOGRAPHY;  Surgeon: Mady Bruckner, MD;  Location: [facility] INVASIVE CV LAB;  Service: Cardiovascular;  Laterality: Bilateral;   two knee surgeries Left 1995     Medications Prior to Admission: Prior to Admission medications   Medication Sig Start Date End Date Taking? Authorizing Provider  acetaminophen  (TYLENOL ) 500 MG tablet Take 1 tablet (500 mg total) by mouth every 6 (six) hours as needed. 05/20/23   Horton, Charmaine FALCON, MD  albuterol  (VENTOLIN  HFA) 108 (90 Base) MCG/ACT inhaler Inhale 2 puffs into the lungs every 6 (six) hours as needed for wheezing or shortness of breath. 05/29/24   Kasa, Kurian, MD  allopurinol  (ZYLOPRIM ) 100 MG tablet TAKE 1 TABLET(100 MG) BY MOUTH DAILY Patient taking differently: Take 100 mg by mouth daily as needed (gout pain). 03/10/23   de Bailey, Calvin PARAS, MD  amLODipine  (NORVASC ) 5 MG tablet Take 1 tablet (5 mg total) by mouth 2 (two) times daily. 05/09/24 05/09/25  Gerard Frederick, NP  aspirin  EC 81 MG tablet Take 1 tablet (81 mg total) by mouth daily. Swallow whole. 05/18/24 05/18/25  End, Bruckner, MD  colchicine  0.6 MG tablet Take 1 tablet (0.6 mg total) by mouth 2 (two) times daily. Patient taking differently: Take 0.6 mg by mouth 2 (two) times daily as needed (gout). 03/10/23   de Bailey, Calvin J, MD  empagliflozin  (JARDIANCE ) 10  MG TABS tablet Take 1 tablet (10 mg total) by mouth daily before breakfast. 04/05/24   Gerard Frederick, NP  furosemide  (LASIX ) 20 MG tablet Take 1 tablet (20 mg total) by mouth daily as needed for fluid or edema. 05/18/24   End, Lonni, MD  hydrALAZINE  (APRESOLINE ) 100 MG tablet Take 1 tablet (100 mg total) by mouth 2 (two) times daily. 06/01/24 06/01/25  Gerard Frederick, NP  isosorbide  mononitrate (IMDUR ) 30 MG 24 hr tablet Take 1 tablet (30 mg total) by mouth daily. 12/02/23   Tobb, Kardie, DO  labetalol  (NORMODYNE ) 300 MG tablet TAKE 1  TABLET(300 MG) BY MOUTH TWICE DAILY 09/12/23   Tobb, Kardie, DO  nitroGLYCERIN  (NITROSTAT ) 0.4 MG SL tablet Place 1 tablet (0.4 mg total) under the tongue every 5 (five) minutes as needed for chest pain. 05/18/24   End, Lonni, MD  spironolactone  (ALDACTONE ) 25 MG tablet TAKE 1 TABLET(25 MG) BY MOUTH DAILY 06/24/23   Tobb, Kardie, DO  valsartan  (DIOVAN ) 160 MG tablet TAKE 1 TABLET(160 MG) BY MOUTH TWICE DAILY 07/26/23   Tobb, Kardie, DO  LISINOPRIL -HYDROCHLOROTHIAZIDE  PO Take by mouth.  07/06/19  [provider]     Allergies:    Allergies  Allergen Reactions   Hydrochlorothiazide  Other (See Comments)    ED   Lisinopril  Cough    Coughing up blood    Social History:   Social History   Socioeconomic History   Marital status: Single    Spouse name: Not on file   Number of children: 2   Years of education: college   Highest education level: Some college, no degree  Occupational History   Occupation: unemployment    Associate Professor: UNEMPLOYED    Employer: [employer]  Tobacco Use   Smoking status: Every Day    Current packs/day: 0.00    Average packs/day: 0.1 packs/day for 16.0 years (1.6 ttl pk-yrs)    Types: Cigarettes    Start date: 06/08/2006    Last attempt to quit: 06/08/2022    Years since quitting: 2.0    Passive exposure: Current   Smokeless tobacco: Never   Tobacco comments:    02/01/24 patient smokes 3-6 cigarettes daily  Vaping Use   Vaping status: Never Used  Substance and Sexual Activity   Alcohol use: Not Currently    Comment: sparingly    Drug use: No   Sexual activity: Yes    Partners: Female  Other Topics Concern   Not on file  Social History Narrative   Corporate investment banker   Patient is single and lives alone.   Patient is left-handed.   Patient does not drink any caffeine.   Social Drivers of Corporate investment banker Strain: Low Risk  (11/30/2023)   Received from [facility]   Overall Financial Resource Strain (CARDIA)    Difficulty  of Paying Living Expenses: Not hard at all  Food Insecurity: No Food Insecurity (11/30/2023)   Received from [facility]   Hunger Vital Sign    Within the past 12 months, you worried that your food would run out before you got the money to buy more.: Never true    Within the past 12 months, the food you bought just didn't last and you didn't have money to get more.: Never true  Transportation Needs: No Transportation Needs (11/30/2023)   Received from [facility] - Transportation    Lack of Transportation (Medical): No    Lack of Transportation (Non-Medical): No  Physical Activity: Insufficiently  Active (03/10/2023)   Exercise Vital Sign    Days of Exercise per Week: 4 days    Minutes of Exercise per Session: 20 min  Stress: No Stress Concern Present (01/20/2024)   Received from [facility] - Occupational Stress Questionnaire    Feeling of Stress : Not at all  Social Connections: Socially Isolated (07/01/2022)   Social Connection and Isolation Panel    Frequency of Communication with Friends and Family: Once a week    Frequency of Social Gatherings with Friends and Family: Once a week    Attends Religious Services: 1 to 4 times per year    Active Member of [employer] or Organizations: No    Attends Banker Meetings: Never    Marital Status: Never married  Intimate Partner Violence: Not At Risk (01/20/2024)   Received from [facility]   HITS    Over the last 12 months how often did your partner physically hurt you?: Never    Over the last 12 months how often did your partner insult you or talk down to you?: Never    Over the last 12 months how often did your partner threaten you with physical harm?: Never    Over the last 12 months how often did your partner scream or curse at you?: Never     Family History:   The patient's family history includes CVA in his maternal uncle; Diabetes in his maternal grandmother; Heart  failure in his maternal grandfather and paternal grandfather; Hyperlipidemia in his paternal grandfather; Hypertension in his brother, father, and mother; Multiple sclerosis in his mother.    ROS:  Please see the history of present illness.  All other ROS reviewed and negative.     Physical Exam/Data: Vitals:   06/11/24 2145 06/11/24 2200 06/11/24 2215 06/11/24 2217  BP:  (!) 152/79  (!) 152/79  Pulse: 66 67 67 65  Resp: 19 (!) 21 15   Temp:      SpO2: 96% 96% 97%   Weight:      Height:        Intake/Output Summary (Last 24 hours) at 06/11/2024 2301 Last data filed at 06/11/2024 2230 Gross per 24 hour  Intake 509.69 ml  Output 1550 ml  Net -1040.31 ml      06/11/2024    6:11 PM 06/01/2024   11:38 AM 05/25/2024   10:29 AM  Last 3 Weights  Weight (lbs) 380 lb 393 lb 12.8 oz 392 lb 6.4 oz  Weight (kg) 172.367 kg 178.627 kg 177.991 kg     Body mass index is 50.13 kg/m.  General:  Well nourished, well developed, in no acute distress HEENT: normal Neck: no JVD Vascular: No carotid bruits; Distal pulses 2+ bilaterally   Cardiac:  normal S1, S2; RRR; no murmur  Lungs:  clear to auscultation bilaterally, no wheezing, rhonchi or rales  Abd: soft, nontender, no hepatomegaly  Ext: Diffuse 2+ lower leg edema Musculoskeletal:  No deformities, BUE and BLE strength normal and equal Skin: warm and dry  Neuro:  CNs 2-12 intact, no focal abnormalities noted Psych:  Normal affect   EKG:  The ECG that was done was personally reviewed and demonstrates NSR with inferior injury pattern (STEMI)  Relevant CV Studies: Cardiac cath and echo studies reviewed  Laboratory Data: High Sensitivity Troponin:  No results for input(s): TROPONINIHS in the last 720 hours.    Chemistry Recent Labs  Lab 06/11/24 1822  NA  138  K 4.5  CL 101  CO2 23  GLUCOSE 100*  BUN 21*  CREATININE 1.73*  CALCIUM  10.2  GFRNONAA 49*  ANIONGAP 14    Recent Labs  Lab 06/11/24 1822  PROT 8.3*  ALBUMIN 4.6   AST 22  ALT 21  ALKPHOS 116  BILITOT 0.4   Lipids  Recent Labs  Lab 06/11/24 1822  CHOL 133  TRIG 294*  HDL 31*  LDLCALC 43  CHOLHDL 4.3   Hematology Recent Labs  Lab 06/11/24 1822  WBC 9.7  RBC 5.49  HGB 15.7  HCT 47.0  MCV 85.6  MCH 28.6  MCHC 33.4  RDW 12.8  PLT 260   Thyroid  No results for input(s): TSH, FREET4 in the last 168 hours. BNPNo results for input(s): BNP, PROBNP in the last 168 hours.  DDimer No results for input(s): DDIMER in the last 168 hours.  Radiology/Studies:  CARDIAC CATHETERIZATION Result Date: 06/11/2024 1.  Diffuse tapering of the mid and distal LAD with angiographic appearance of diffuse intramural hematoma and apical dissection 2.  Widely patent RCA, left main, proximal LAD, and left circumflex/intermediate branches with no significant stenoses 3.  Normal LVEDP After review of his films in comparison to his old study, the patient may have both coronary vasospasm and currently with spontaneous coronary artery dissection.  On today's study, the apical LAD is involved and since the LAD wraps the apex and supplies the distal inferior wall I think this is the etiology of his inferior ST elevation. Plan medical therapy.     Assessment and Plan: Acute inferior STEMI HTN, malignant HFpEF, chronic Tobacco abuse CKD 3b Supermorbid obesity  Plan emergency cardiac cath and possible PCI. Emergency implied consent obtained. Heparin  administered. Continue multidrug antihypertensive therapy. Smoking cessation counseling. Further disposition/medical therapy recommendations pending his cardiac catheterization results. Reviewed recent cath films for baseline assessment.   Risk Assessment/Risk Scores:   TIMI Risk Score for ST  Elevation MI:   The patient's TIMI risk score is 1, which indicates a 1.6% risk of all cause mortality at 30 days.      Code Status: Full Code  Severity of Illness: The appropriate patient status for this patient is  INPATIENT. Inpatient status is judged to be reasonable and necessary in order to provide the required intensity of service to ensure the patient's safety. The patient's presenting symptoms, physical exam findings, and initial radiographic and laboratory data in the context of their chronic comorbidities is felt to place them at high risk for further clinical deterioration. Furthermore, it is not anticipated that the patient will be medically stable for discharge from the hospital within 2 midnights of admission.   * I certify that at the point of admission it is my clinical judgment that the patient will require inpatient hospital care spanning beyond 2 midnights from the point of admission due to high intensity of service, high risk for further deterioration and high frequency of surveillance required.*  For questions or updates, please contact [facility] Please consult [email or web address] for contact info under       Signed, Ozell Fell, MD  06/11/2024 11:01 PM

## 2024-06-11 NOTE — ED Notes (Signed)
 Care Link at bedside

## 2024-06-12 ENCOUNTER — Other Ambulatory Visit (HOSPITAL_COMMUNITY): Payer: Self-pay

## 2024-06-12 ENCOUNTER — Encounter (HOSPITAL_COMMUNITY): Payer: Self-pay | Admitting: Cardiovascular Disease

## 2024-06-12 ENCOUNTER — Telehealth (HOSPITAL_COMMUNITY): Payer: Self-pay | Admitting: Pharmacy Technician

## 2024-06-12 ENCOUNTER — Ambulatory Visit: Admitting: Internal Medicine

## 2024-06-12 ENCOUNTER — Inpatient Hospital Stay (HOSPITAL_COMMUNITY)

## 2024-06-12 DIAGNOSIS — I2489 Other forms of acute ischemic heart disease: Secondary | ICD-10-CM | POA: Diagnosis not present

## 2024-06-12 DIAGNOSIS — I2542 Coronary artery dissection: Secondary | ICD-10-CM

## 2024-06-12 DIAGNOSIS — I160 Hypertensive urgency: Secondary | ICD-10-CM

## 2024-06-12 LAB — ECHOCARDIOGRAM COMPLETE
Area-P 1/2: 3.7 cm2
Calc EF: 67.5 %
Height: 73 in
S' Lateral: 3.7 cm
Single Plane A2C EF: 72 %
Single Plane A4C EF: 61 %
Weight: 6067.06 [oz_av]

## 2024-06-12 LAB — CBC
HCT: 45.1 % (ref 39.0–52.0)
Hemoglobin: 15.2 g/dL (ref 13.0–17.0)
MCH: 29 pg (ref 26.0–34.0)
MCHC: 33.7 g/dL (ref 30.0–36.0)
MCV: 86.1 fL (ref 80.0–100.0)
Platelets: 233 K/uL (ref 150–400)
RBC: 5.24 MIL/uL (ref 4.22–5.81)
RDW: 12.6 % (ref 11.5–15.5)
WBC: 8.4 K/uL (ref 4.0–10.5)
nRBC: 0 % (ref 0.0–0.2)

## 2024-06-12 LAB — GLUCOSE, CAPILLARY: Glucose-Capillary: 102 mg/dL — ABNORMAL HIGH (ref 70–99)

## 2024-06-12 LAB — TROPONIN I (HIGH SENSITIVITY)
Troponin I (High Sensitivity): 3681 ng/L (ref <18)
Troponin I (High Sensitivity): 3937 ng/L (ref <18)

## 2024-06-12 MED ORDER — CLONIDINE HCL 0.2 MG/24HR TD PTWK
0.2000 mg | MEDICATED_PATCH | TRANSDERMAL | Status: DC
Start: 2024-06-12 — End: 2024-06-12
  Administered 2024-06-12: 0.2 mg via TRANSDERMAL
  Filled 2024-06-12: qty 1

## 2024-06-12 MED ORDER — CHLORHEXIDINE GLUCONATE CLOTH 2 % EX PADS
6.0000 | MEDICATED_PAD | Freq: Every day | CUTANEOUS | Status: DC
  Administered 2024-06-12: 6 via TOPICAL

## 2024-06-12 MED ORDER — NICOTINE 14 MG/24HR TD PT24
14.0000 mg | MEDICATED_PATCH | Freq: Every day | TRANSDERMAL | Status: DC
  Filled 2024-06-12: qty 1

## 2024-06-12 MED ORDER — AMLODIPINE BESYLATE 10 MG PO TABS
10.0000 mg | ORAL_TABLET | Freq: Every day | ORAL | Status: DC
  Administered 2024-06-13: 10 mg via ORAL
  Filled 2024-06-12: qty 1

## 2024-06-12 MED ORDER — ISOSORBIDE DINITRATE 30 MG PO TABS
30.0000 mg | ORAL_TABLET | Freq: Three times a day (TID) | ORAL | Status: DC
  Administered 2024-06-12 – 2024-06-13 (×4): 30 mg via ORAL
  Filled 2024-06-12 (×6): qty 1

## 2024-06-12 MED ORDER — AMLODIPINE BESYLATE 10 MG PO TABS
10.0000 mg | ORAL_TABLET | Freq: Two times a day (BID) | ORAL | Status: DC
Start: 2024-06-12 — End: 2024-06-12
  Administered 2024-06-12: 10 mg via ORAL
  Filled 2024-06-12: qty 1

## 2024-06-12 MED FILL — Lidocaine HCl Local Preservative Free (PF) Inj 1%: INTRAMUSCULAR | Qty: 30 | Status: AC

## 2024-06-12 NOTE — Progress Notes (Signed)
 Patient c/o dizziness and not feeling well.  BP 93/42 while sitting. Feet put up and rechecked BP now 94/52. Dr. Ganji notified and order received to take Catapres  patch off patient. Will continue to monitor patient's BP.

## 2024-06-12 NOTE — TOC Initial Note (Addendum)
 Transition of Care [facility]) - Initial/Assessment Note    Patient Details  Name: Calvin Bailey MRN: [id number] Date of Birth: October 14, 1980  Transition of Care [facility]) CM/SW Contact:    Justina Delcia Czar, RN Phone Number: [phone] 06/12/2024, 4:39 PM  Clinical Narrative:                 Inpatient CM spoke to pt and friend at bedside. Pt states he was independent pta. Has PCP and insurance. Has CPAP at home.  Will continue to follow for dc needs.   Expected Discharge Plan: Home/Self Care Barriers to Discharge: Continued Medical Work up   Patient Goals and CMS Choice Patient states their goals for this hospitalization and ongoing recovery are:: wants to recover   Expected Discharge Plan and Services   Discharge Planning Services: CM Consult   Living arrangements for the past 2 months: Apartment                   Prior Living Arrangements/Services Living arrangements for the past 2 months: Apartment   Patient language and need for interpreter reviewed:: Yes Do you feel safe going back to the place where you live?: Yes      Need for Family Participation in Patient Care: No (Comment) Care giver support system in place?: No (comment)   Criminal Activity/Legal Involvement Pertinent to Current Situation/Hospitalization: No - Comment as needed  Activities of Daily Living   ADL Screening (condition at time of admission) Independently performs ADLs?: No Does the patient have a NEW difficulty with bathing/dressing/toileting/self-feeding that is expected to last >3 days?: No Does the patient have a NEW difficulty with getting in/out of bed, walking, or climbing stairs that is expected to last >3 days?: No Does the patient have a NEW difficulty with communication that is expected to last >3 days?: No Is the patient deaf or have difficulty hearing?: No Does the patient have difficulty seeing, even when wearing glasses/contacts?: No Does the patient have difficulty concentrating,  remembering, or making decisions?: No  Permission Sought/Granted Permission sought to share information with : Case Manager, Family Supports, PCP Permission granted to share information with : Yes, Verbal Permission Granted              Emotional Assessment Appearance:: Appears stated age Attitude/Demeanor/Rapport: Engaged Affect (typically observed): Accepting Orientation: : Oriented to Self, Oriented to Place, Oriented to  Time, Oriented to Situation   Psych Involvement: No (comment)  Admission diagnosis:  STEMI (ST elevation myocardial infarction) (HCC) [I21.3] ST elevation myocardial infarction (STEMI), unspecified artery (HCC) [I21.3] STEMI involving left anterior descending coronary artery (HCC) [I21.02] Patient Active Problem List   Diagnosis Date Noted   ST elevation myocardial infarction involving left anterior descending (LAD) coronary artery (HCC) 06/11/2024   Spontaneous dissection of coronary artery 06/11/2024   Abnormal nuclear cardiac imaging test 05/18/2024   Cervical radiculopathy 02/24/2024   Physical deconditioning 01/11/2024   Personal history of kidney cancer 12/16/2023   Dysphagia 12/02/2023   Lymphedema of both lower extremities 02/03/2023   Gout 12/08/2022   Hospital discharge follow-up 11/23/2022   Hypertensive urgency 11/20/2022   Chest pain of uncertain etiology 11/20/2022   Renal mass 07/01/2022   Wellness examination 05/24/2022   Intractable migraine without aura and with status migrainosus 03/31/2022   CPAP (continuous positive airway pressure) dependence 03/31/2022   Chronic kidney disease 03/31/2022   Primary hypertension 03/31/2022   Chronic headaches 03/22/2022   Chronic kidney disease, stage 3a (HCC) 02/27/2022  Expressive aphasia 02/27/2022   Occipital headache 02/27/2022   Chronic diastolic CHF (congestive heart failure) (HCC) 02/27/2022   Morbid obesity (HCC) 02/27/2022   GERD (gastroesophageal reflux disease)    HTN  (hypertension)    Acute respiratory failure due to COVID-19 (HCC) 04/10/2020   ARF (acute renal failure) (HCC) 04/10/2020   Dizziness 09/25/2019   Green nails 07/31/2019   Shortness of breath 05/30/2019   3+ pitting edema 05/30/2019   Tinea versicolor 01/04/2019   Tinea corporis 01/04/2019   LVH (left ventricular hypertrophy) due to hypertensive disease 10/07/2017   Chronic cough 06/11/2017   Abnormal CT of the chest 06/11/2017   Herpes simplex infection of penis 02/15/2017   Hypogonadism male 07/12/2014   Erectile dysfunction due to arterial insufficiency 07/12/2014   Sleep apnea 06/21/2014   Obesity hypoventilation syndrome (HCC) 04/08/2014   Morbid obesity due to excess calories (HCC) 04/08/2014   OSA (obstructive sleep apnea) 04/08/2014   Nicotine  dependence, cigarettes, uncomplicated 02/23/2011   Tobacco use disorder 02/23/2011   Gastroesophageal reflux disease without esophagitis 02/09/2011   Essential hypertension 07/21/2009   PCP:  de [place], [name], MD Pharmacy:   [id number] - [place], [place] - [address] AT [facility] OF [address] [place] [address] Phone: [phone] Fax: [phone]  [facility] [address] [place] [address] Phone: [phone] Fax: [phone]  [facility] - [place], [place] - [address] [place] [address] Phone: [phone] Fax: [phone]     Social Drivers of Health (SDOH) Social History: SDOH Screenings   Food Insecurity: No Food Insecurity (06/11/2024)  Housing: Low Risk  (06/11/2024)  Transportation Needs: No Transportation Needs (06/11/2024)  Utilities: Not At Risk (06/11/2024)  Alcohol Screen: Low Risk  (07/01/2022)  Depression (PHQ2-9): Low Risk  (02/24/2024)  Financial Resource Strain: Low Risk  (11/30/2023)   Received from [facility]  Physical  Activity: Insufficiently Active (03/10/2023)  Social Connections: Socially Isolated (07/01/2022)  Stress: No Stress Concern Present (01/20/2024)   Received from [facility]  Tobacco Use: High Risk (06/01/2024)   SDOH Interventions:     Readmission Risk Interventions     No data to display

## 2024-06-12 NOTE — Telephone Encounter (Signed)
 Patient Product/process development scientist completed.    The patient is insured through [facility] Patient has [employer], may use a copay card, and/or apply for patient assistance if available.    Ran test claim for isosorbide -hydralazine  (Bidil ) 20-37.5 mg  and the current 30 day co-pay is $0.00.   This test claim was processed through [facility]- copay amounts may vary at other pharmacies due to pharmacy/plan contracts, or as the patient moves through the different stages of their insurance plan.     Reyes Sharps, CPHT Pharmacy Technician III Certified Patient Advocate [facility] Patient Advocate Team Direct Number: [phone]  Fax: [phone]

## 2024-06-12 NOTE — Progress Notes (Deleted)
 [facility] [facility] Consultation      Date: 06/12/2024,   MRN# [id number] Calvin Bailey Feb 14, 1981  July 2025 pulmonary function testing Pulmonary function testing postbronchodilator FEV1 FVC ratio 75% predicted FEV1 is 59% predicted FVC is 56% predicted TLC 82% predicted RV 86% predicted DLCO 105% predicted No significant bronchodilator response Flow-volume loops suggestive of restrictive and obstructive pattern Findings suggest underlying obstructive lung disease and restrictive lung disease most likely from morbid obesity   CHIEF COMPLAINT:   Follow-up assessment for shortness of breath Follow-up assessment for tobacco abuse Follow-up assessment for COPD Follow-up assessment for abnormal CT chest   HISTORY OF PRESENT ILLNESS  Follow-up assessment for shortness of breath January 2025 diagnosed with influenza A Patient had urgent care visit was given prednisone  and antibiotics  February 2025 admitted for shortness of breath has CKD stage III found to have hyperkalemia-electrolytes managed properly patient symptoms resolved  May 2025-admitted for shortness of breath in the ER patient self medicated himself with Lasix  and symptoms improved Patient also has a diagnosis of kidney cancer which is being taken care of by interventional radiology undergoing cryotherapy  Patient continues to smoke Patient being evaluated for bariatric surgery currently weighs 392 pounds  No exacerbation at this time No evidence of heart failure at this time No evidence or signs of infection at this time No respiratory distress No fevers, chills, nausea, vomiting, diarrhea No evidence of lower extremity edema No evidence hemoptysis      CT chest July 2025 Left lower lobe lung nodule subcentimeter 2 to 3 mm Follow-up CT in 3 months    PAST MEDICAL HISTORY   Past Medical History:  Diagnosis Date   Arthritis    Cancer (HCC)    right kidney   CHF (congestive heart  failure) (HCC)    pt. 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     SURGICAL HISTORY   Past Surgical History:  Procedure Laterality Date   CORONARY/GRAFT ACUTE MI REVASCULARIZATION N/A 06/11/2024   Procedure: Coronary/Graft Acute MI Revascularization;  Surgeon: Wonda Sharper, MD;  Location: [facility] INVASIVE CV LAB;  Service: Cardiovascular;  Laterality: N/A;   IR RADIOLOGIST EVAL & MGMT  03/07/2024   LEFT HEART CATH AND CORONARY ANGIOGRAPHY N/A 06/11/2024   Procedure: LEFT HEART CATH AND CORONARY ANGIOGRAPHY;  Surgeon: Wonda Sharper, MD;  Location: [facility] INVASIVE CV LAB;  Service: Cardiovascular;  Laterality: N/A;   RADIOLOGY WITH ANESTHESIA Right 02/16/2023   Procedure: RIGHT RENAL MASS CRYO ABLATION;  Surgeon: Philip Cornet, MD;  Location: [facility] ORS;  Service: Anesthesiology;  Laterality: Right;   RIGHT/LEFT HEART CATH AND CORONARY ANGIOGRAPHY Bilateral 05/18/2024   Procedure: RIGHT/LEFT HEART CATH AND CORONARY ANGIOGRAPHY;  Surgeon: Mady Bruckner, MD;  Location: [facility] INVASIVE CV LAB;  Service: Cardiovascular;  Laterality: Bilateral;   two knee surgeries Left 1995     FAMILY HISTORY   Family History  Problem Relation Age of Onset   Hypertension Mother    Multiple sclerosis Mother    Hypertension Father    Hypertension Brother    Hyperlipidemia Paternal Grandfather    Heart failure Paternal Grandfather    Diabetes Maternal Grandmother  Heart failure Maternal Grandfather    CVA Maternal Uncle      SOCIAL HISTORY   Social History   Tobacco Use   Smoking status: Every Day    Current packs/day: 0.00    Average packs/day: 0.1 packs/day for 45.0 years (1.6 ttl pk-yrs)    Types:  Cigarettes    Start date: 06/08/2006    Last attempt to quit: 06/08/2022    Years since quitting: 45.0    Passive exposure: Current   Smokeless tobacco: Never   Tobacco comments:    02/01/24 patient smokes 3-6 cigarettes daily  Vaping Use   Vaping status: Never Used  Substance Use Topics   Alcohol use: Not Currently    Comment: sparingly    Drug use: No     MEDICATIONS    Home Medication:     Current Medication: No current facility-administered medications for this visit. No current outpatient medications on file.  Facility-Administered Medications Ordered in Other Visits:    0.9 %  sodium chloride  infusion, , Intravenous, Continuous, Wonda Sharper, MD   0.9 %  sodium chloride  infusion, 250 mL, Intravenous, PRN, Cooper, Michael, MD   acetaminophen  (TYLENOL ) tablet 650 mg, 650 mg, Oral, Q4H PRN, Cooper, Michael, MD   amLODipine  (NORVASC ) tablet 10 mg, 10 mg, Oral, BID, Ganji, Jay, MD, 10 mg at 06/12/24 0945   aspirin  EC tablet 81 mg, 81 mg, Oral, Daily, Wonda Sharper, MD, 81 mg at 06/12/24 9056   Chlorhexidine  Gluconate Cloth 2 % PADS 6 each, 6 each, Topical, Daily, Ganji, Jay, MD   cloNIDine  (CATAPRES  - Dosed in mg/24 hr) patch 0.2 mg, 0.2 mg, Transdermal, Weekly, Ganji, Jay, MD, 0.2 mg at 06/12/24 9051   empagliflozin  (JARDIANCE ) tablet 10 mg, 10 mg, Oral, QAC breakfast, Cooper, Michael, MD, 10 mg at 06/12/24 0741   enoxaparin  (LOVENOX ) injection 40 mg, 40 mg, Subcutaneous, Q24H, Cooper, Michael, MD, 40 mg at 06/12/24 9052   hydrALAZINE  (APRESOLINE ) tablet 100 mg, 100 mg, Oral, BID, Cooper, Michael, MD, 100 mg at 06/12/24 1004   irbesartan  (AVAPRO ) tablet 150 mg, 150 mg, Oral, Daily, Wonda Sharper, MD, 150 mg at 06/12/24 9053   isosorbide  dinitrate (ISORDIL ) tablet 30 mg, 30 mg, Oral, TID, Ganji, Jay, MD, 30 mg at 06/12/24 0949   labetalol  (NORMODYNE ) tablet 300 mg, 300 mg, Oral, BID, Cooper, Michael, MD, 300 mg at 06/12/24 9052   morphine  (PF) 2 MG/ML injection 2 mg, 2 mg,  Intravenous, Q1H PRN, Wonda Sharper, MD   nicotine  (NICODERM CQ  - dosed in mg/24 hours) patch 14 mg, 14 mg, Transdermal, Daily, Ganji, Jay, MD   nitroGLYCERIN  (NITROSTAT ) SL tablet 0.4 mg, 0.4 mg, Sublingual, Q5 min PRN, Wonda Sharper, MD   ondansetron  (ZOFRAN ) injection 4 mg, 4 mg, Intravenous, Q6H PRN, Wonda Sharper, MD   Oral care mouth rinse, 15 mL, Mouth Rinse, PRN, Ladona Heinz, MD   oxyCODONE  (Oxy IR/ROXICODONE ) immediate release tablet 5-10 mg, 5-10 mg, Oral, Q4H PRN, Wonda Sharper, MD   sodium chloride  flush (NS) 0.9 % injection 3 mL, 3 mL, Intravenous, Q12H, Wonda Sharper, MD, 3 mL at 06/12/24 0950   sodium chloride  flush (NS) 0.9 % injection 3 mL, 3 mL, Intravenous, PRN, Wonda Sharper, MD   spironolactone  (ALDACTONE ) tablet 25 mg, 25 mg, Oral, Daily, Wonda Sharper, MD, 25 mg at 06/12/24 0945    ALLERGIES   Hydralazine  hcl, Hydrochlorothiazide , and Lisinopril   There were no vitals taken for this visit.     Review of Systems: Gen:  Denies  fever, sweats, chills weight loss  HEENT: Denies blurred vision, double vision, ear pain, eye pain, hearing loss, nose bleeds, sore throat Cardiac:  No dizziness, chest pain or heaviness, chest tightness,edema, No JVD Resp:   No cough, -sputum production, -shortness of breath,-wheezing, -hemoptysis,  Other:  All other systems negative   Physical Examination:   General Appearance: No distress  EYES PERRLA, EOM intact.   NECK Supple, No JVD Pulmonary: normal breath sounds, No wheezing.  CardiovascularNormal S1,S2.  No m/r/g.   Abdomen: Benign, Soft, non-tender. Neurology UE/LE 5/5 strength, no focal deficits Ext pulses intact, cap refill intact ALL OTHER ROS ARE NEGATIVE       IMAGING    CARDIAC CATHETERIZATION Result Date: 06/11/2024 1.  Diffuse tapering of the mid and distal LAD with angiographic appearance of diffuse intramural hematoma and apical dissection 2.  Widely patent RCA, left main, proximal LAD, and  left circumflex/intermediate branches with no significant stenoses 3.  Normal LVEDP After review of his films in comparison to his old study, the patient may have both coronary vasospasm and currently with spontaneous coronary artery dissection.  On today's study, the apical LAD is involved and since the LAD wraps the apex and supplies the distal inferior wall I think this is the etiology of his inferior ST elevation. Plan medical therapy.   ECHOCARDIOGRAM COMPLETE Result Date: 05/24/2024    ECHOCARDIOGRAM REPORT   Patient Name:   JONCARLOS ATKISON Date of Exam: 05/24/2024 Medical Rec #:  [id number]        Height:       73.0 in Accession #:    [id number]       Weight:       402.0 lb Date of Birth:  October 23, 1980        BSA:          2.895 m Patient Age:    42 years         BP:           147/87 mmHg Patient Gender: M                HR:           68 bpm. Exam Location:  [address] Procedure: 2D Echo, Cardiac Doppler, Color Doppler and Strain Analysis (Both            Spectral and Color Flow Doppler were utilized during procedure). Indications:    R06.02 Shortness of Breath  History:        Patient has prior history of Echocardiogram examinations, most                 recent 11/20/2022. CHF, Signs/Symptoms:Shortness of Breath,                 Edema, Dizziness/Lightheadedness and Chest Pain; Risk                 Factors:Sleep Apnea, Hypertension, Dyslipidemia, Family History                 of Coronary Artery Disease and Current Smoker. Obesity, Chronic                 Kidney Disease.  Sonographer:    Heather Hawks RDCS Referring Phys: SHERI HAMMOCK IMPRESSIONS  1. Left ventricular ejection fraction, by estimation, is 60 to 65%. The left ventricle has normal function. Left ventricular endocardial border not optimally defined to evaluate regional wall motion. The left ventricular internal cavity size was mildly dilated. Left  ventricular diastolic parameters are consistent with Grade II diastolic dysfunction  (pseudonormalization). The average left ventricular global longitudinal strain is -18.3 %. The global longitudinal strain is normal.  2. Right ventricular systolic function is normal. The right ventricular size is moderately enlarged. Tricuspid regurgitation signal is inadequate for assessing PA pressure.  3. Left atrial size was moderately dilated.  4. The mitral valve is normal in structure. No evidence of mitral valve regurgitation. No evidence of mitral stenosis.  5. The aortic valve is tricuspid. Aortic valve regurgitation is trivial. Aortic valve sclerosis/calcification is present, without any evidence of aortic stenosis. Aortic valve area, by VTI measures 4.06 cm. Aortic valve mean gradient measures 9.8 mmHg.  Aortic valve Vmax measures 2.12 m/s.  6. The inferior vena cava is normal in size with greater than 50% respiratory variability, suggesting right atrial pressure of 3 mmHg. FINDINGS  Left Ventricle: Left ventricular ejection fraction, by estimation, is 60 to 65%. The left ventricle has normal function. Left ventricular endocardial border not optimally defined to evaluate regional wall motion. The average left ventricular global longitudinal strain is -18.3 %. Strain was performed and the global longitudinal strain is normal. The left ventricular internal cavity size was mildly dilated. There is no left ventricular hypertrophy. Left ventricular diastolic parameters are consistent with Grade II diastolic dysfunction (pseudonormalization). Normal left ventricular filling pressure. Right Ventricle: The right ventricular size is moderately enlarged. No increase in right ventricular wall thickness. Right ventricular systolic function is normal. Tricuspid regurgitation signal is inadequate for assessing PA pressure. Left Atrium: Left atrial size was moderately dilated. Right Atrium: Right atrial size was normal in size. Pericardium: There is no evidence of pericardial effusion. Mitral Valve: The mitral valve  is normal in structure. Mild mitral annular calcification. No evidence of mitral valve regurgitation. No evidence of mitral valve stenosis. Tricuspid Valve: The tricuspid valve is normal in structure. Tricuspid valve regurgitation is not demonstrated. No evidence of tricuspid stenosis. Aortic Valve: The aortic valve is tricuspid. Aortic valve regurgitation is trivial. Aortic valve sclerosis/calcification is present, without any evidence of aortic stenosis. Aortic valve mean gradient measures 9.8 mmHg. Aortic valve peak gradient measures 17.9 mmHg. Aortic valve area, by VTI measures 4.06 cm. Pulmonic Valve: The pulmonic valve was normal in structure. Pulmonic valve regurgitation is not visualized. No evidence of pulmonic stenosis. Aorta: The aortic root is normal in size and structure. Venous: The inferior vena cava is normal in size with greater than 50% respiratory variability, suggesting right atrial pressure of 3 mmHg. IAS/Shunts: No atrial level shunt detected by color flow Doppler.  LEFT VENTRICLE PLAX 2D LVIDd:         6.20 cm   Diastology LVIDs:         3.20 cm   LV e' medial:    7.18 cm/s LV PW:         1.40 cm   LV E/e' medial:  12.0 LV IVS:        0.90 cm   LV e' lateral:   11.30 cm/s LVOT diam:     2.60 cm   LV E/e' lateral: 7.7 LV SV:         165 LV SV Index:   57        2D Longitudinal Strain LVOT Area:     5.31 cm  2D Strain GLS (A4C):   -19.1 %  2D Strain GLS (A3C):   -16.4 %                          2D Strain GLS (A2C):   -19.5 %                          2D Strain GLS Avg:     -18.3 % RIGHT VENTRICLE RV Basal diam:  4.95 cm RV Mid diam:    4.30 cm RV S prime:     17.90 cm/s TAPSE (M-mode): 2.9 cm LEFT ATRIUM              Index        RIGHT ATRIUM           Index LA diam:        4.50 cm  1.55 cm/m   RA Pressure: 3.00 mmHg LA Vol (A2C):   146.0 ml 50.43 ml/m  RA Area:     26.80 cm LA Vol (A4C):   85.7 ml  29.60 ml/m  RA Volume:   91.30 ml  31.54 ml/m LA Biplane Vol: 117.0  ml 40.42 ml/m  AORTIC VALVE AV Area (Vmax):    3.69 cm AV Area (Vmean):   3.75 cm AV Area (VTI):     4.06 cm AV Vmax:           211.75 cm/s AV Vmean:          143.750 cm/s AV VTI:            0.407 m AV Peak Grad:      17.9 mmHg AV Mean Grad:      9.8 mmHg LVOT Vmax:         147.33 cm/s LVOT Vmean:        101.667 cm/s LVOT VTI:          0.311 m LVOT/AV VTI ratio: 0.76  AORTA Ao Root diam: 3.30 cm Ao Asc diam:  3.60 cm MITRAL VALVE               TRICUSPID VALVE MV Area (PHT)  cm         Estimated RAP:  3.00 mmHg MV Decel Time: 229 msec MV E velocity: 86.48 cm/s  SHUNTS MV A velocity: 88.55 cm/s  Systemic VTI:  0.31 m MV E/A ratio:  0.98        Systemic Diam: 2.60 cm Wilbert Bihari MD Electronically signed by Wilbert Bihari MD Signature Date/Time: 05/24/2024/9:24:09 AM    Final    CARDIAC CATHETERIZATION Result Date: 05/18/2024 Conclusions: 70% ostial/proximal RCA stenosis that improves to 30% with intracoronary nitroglycerin  suggesting a significant component of vasospasm.  Otherwise, no angiographically significant coronary artery disease. Upper normal to mildly elevated left heart filling pressures (PCWP 17, LVEDP 18 mmHg). Moderately elevated right heart filling pressures (mean RA 13, RV 43/14 mmHg). Mild pulmonary hypertension (PA 40/19, mean 26 mmHg). Normal Fick cardiac output/index (CO 8.2 L/min, CI 2.8 L/min/m). Moderate aortic valve/LVOT peak to peak gradient (24 mmHg). Recommendations: Continue medical therapy and risk factor modification to prevent progression of CAD.  Continue isosorbide  mononitrate and amlodipine  for treatment of possible component of coronary vasospasm. Proceed with echocardiogram for further assessment of possible LVOT/aortic valve gradient.  If echo is inconclusive, cardiac MRI may be helpful. Maintain net even to slightly negative fluid balance. Avoid nephrotoxic agents, including NSAIDs. Lonni Hanson, MD [facility]     ASSESSMENT/PLAN  43 year old pleasant  African-American male seen today for ongoing shortness of breath likely related to underlying reactive airways disease and COPD due to extensive smoking history in the setting of morbid obesity and deconditioned state with underlying signs and symptoms of chronic kidney disease stage III with diastolic dysfunction in the setting of diagnosis of kidney carcinoma, underlying diagnosis of OSA  Assessment & Plan   Assessment & Plan Chronic obstructive pulmonary disease, unspecified COPD type (HCC) FEV1 59% predicted Continue to use albuterol  as needed Avoid Allergens and Irritants Avoid secondhand smoke Avoid SICK contacts Recommend  Masking  when appropriate Recommend Keep up-to-date with vaccinations No exacerbation at this time No evidence of heart failure at this time No evidence or signs of infection at this time No respiratory distress No fevers, chills, nausea, vomiting, diarrhea No evidence of lower extremity edema No evidence hemoptysis   OSA (obstructive sleep apnea) We will take care of your sleep apnea-you may need to bring your machine for further evaluation  Tobacco abuse Smoking Assessment and Cessation Counseling Upon further questioning, Patient smokes *** I have advised patient to quit/stop smoking as soon as possible due to high risk for multiple medical problems Patient is willing to quit smoking *** is NOT willing to quit smoking I have advised patient that we can assist and have options of Nicotine  replacement therapy. I also advised patient on behavioral therapy and can provide oral medication therapy in conjunction with the other therapies Follow up next Office visit  for assessment of smoking cessation Smoking cessation counseling advised for 4 minutes    Morbid obesity (HCC) Obesity -recommend significant weight loss -recommend changing diet  Deconditioned state -Recommend increased daily activity and exercise    Lung nodule Recommend follow up CT  chest in 3 months     Cardiac dysfunction Follow-up cardiology Lasix  as prescribed   Kidney carcinoma Follow-up interventional radiology cryosurgery ablation therapy performed Recommend CT chest to assess for metastatic disease   Follow-up bariatric surgery Follow-up evaluation     MEDICATION ADJUSTMENTS/LABS AND TESTS ORDERED: Recommend obtaining CT of the chest in 3 months Can use albuterol  as needed Follow-up with cardiology Follow-up with nephrology Follow-up with Dr. Philip interventional radiology for kidney cancer We will help with your sleep apnea machine and sleep apnea-please bring in your machine for assessment Follow-up with bariatric surgery evaluation Avoid Allergens and Irritants Avoid secondhand smoke Avoid SICK contacts Recommend  Masking  when appropriate Recommend Keep up-to-date with vaccinations  Cardiac dysfunction Follow-up cardiology Lasix  as prescribed  Kidney carcinoma Follow-up interventional radiology cryosurgery ablation therapy performed Recommend CT chest to assess for metastatic disease  Follow-up bariatric surgery Follow-up evaluation   MEDICATION ADJUSTMENTS/LABS AND TESTS ORDERED: Recommend obtaining CT of the chest in 3 months Can use albuterol  as needed Follow-up with cardiology Follow-up with nephrology Follow-up with Dr. Philip interventional radiology for kidney cancer We will help with your sleep apnea machine and sleep apnea-please bring in your machine for assessment Follow-up with bariatric surgery evaluation Avoid Allergens and Irritants Avoid secondhand smoke Avoid SICK contacts Recommend  Masking  when appropriate Recommend Keep up-to-date with vaccinations  CURRENT MEDICATIONS REVIEWED AT LENGTH WITH PATIENT TODAY   Patient  satisfied with Plan of action and management. All questions answered   Follow up 3 months   I spent a total of 48 minutes dedicated to the care of this patient on the date of this  encounter to include pre-visit review of records, face-to-face time with the patient discussing conditions  above, post visit ordering of testing, clinical documentation with the electronic health record, making appropriate referrals as documented, and communicating necessary information to the patient's healthcare team.    The Patient requires high complexity decision making for assessment and support, frequent evaluation and titration of therapies, application of advanced monitoring technologies and extensive interpretation of multiple databases.  Patient satisfied with Plan of action and management. All questions answered    Nickolas Alm Cellar, M.D.  [facility]  Medical Director [facility] Medical Director [facility] Cardio-Pulmonary Department

## 2024-06-12 NOTE — Progress Notes (Signed)
 Chaplain responded to AD spiritual consult. Paperwork and information given and all questions answered at this time. Pt's father Waylan bedside. Chaplains remain available as needs arise.

## 2024-06-12 NOTE — Progress Notes (Signed)
 Patient Name: Calvin Bailey Date of Encounter: 06/12/2024 [facility] Cardiologist: Kardie Tobb, DO  06/11/2024 .admit Length of stay: 1  Interval Summary  .    Calvin Bailey is a 43 y.o. African American male patient with morbid obesity, OSA on CPAP, hypertension admitted with chest pain, EKG revealing hyperacute T wave abnormality in inferolateral leads suggestive of acute STEMI, emergently taken to the cardiac catheterization lab on 06/12/2024 revealing SCAD involving the LAD with intramural hematoma otherwise no significant coronary artery disease.  This morning he is stable and denies any chest pain.  Physical Exam    Vitals:   06/12/24 0615 06/12/24 0630 06/12/24 0645 06/12/24 0700  BP:  (!) 155/78    Pulse: (!) 57 63 62   Resp: 16 19 18    Temp:    98.1 F (36.7 C)  TempSrc:    Axillary  SpO2: 90% 96% 95%   Weight:      Height:       Physical Exam Constitutional:      Comments: Morbidly obese in no acute distress.  Neck:     Vascular: No carotid bruit or JVD.  Cardiovascular:     Rate and Rhythm: Normal rate and regular rhythm.     Pulses: Intact distal pulses.          Carotid pulses are 2+ on the right side and 2+ on the left side.    Heart sounds: Heart sounds are distant. No murmur heard.    No gallop.  Pulmonary:     Effort: Pulmonary effort is normal.     Breath sounds: Normal breath sounds.  Abdominal:     General: Abdomen is protuberant. Bowel sounds are normal.     Palpations: Abdomen is soft.     Comments: Obese. Pannus present  Musculoskeletal:     Right lower leg: Edema (large adipose tissue with trace edema) present.     Left lower leg: Edema (large adipose tissue with trace edema) present.        06/11/2024   11:13 PM 06/11/2024    6:11 PM 06/01/2024   11:38 AM  Last 3 Weights  Weight (lbs) 379 lb 3.1 oz 380 lb 393 lb 12.8 oz  Weight (kg) 172 kg 172.367 kg 178.627 kg      Labs   Lab Results  Component Value Date   NA  138 06/11/2024   K 4.5 06/11/2024   CO2 23 06/11/2024   GLUCOSE 100 (H) 06/11/2024   BUN 21 (H) 06/11/2024   CREATININE 1.60 (H) 06/11/2024   CALCIUM  10.2 06/11/2024   EGFR 48 (L) 06/01/2024   GFRNONAA 54 (L) 06/11/2024       Latest Ref Rng & Units 06/11/2024   11:14 PM 06/11/2024    6:22 PM 06/01/2024   12:27 PM  BMP  Glucose 70 - 99 mg/dL  899  95   BUN 6 - 20 mg/dL  21  19   Creatinine 9.38 - 1.24 mg/dL 8.39  8.26  8.23   BUN/Creat Ratio 9 - 20   11   Sodium 135 - 145 mmol/L  138  140   Potassium 3.5 - 5.1 mmol/L  4.5  5.0   Chloride 98 - 111 mmol/L  101  105   CO2 22 - 32 mmol/L  23  21   Calcium  8.9 - 10.3 mg/dL  89.7  9.5        Latest Ref Rng & Units 06/12/2024  12:32 AM 06/11/2024    6:22 PM 05/18/2024    8:07 AM  CBC  WBC 4.0 - 10.5 K/uL 8.4  9.7    Hemoglobin 13.0 - 17.0 g/dL 84.7  84.2  86.0   Hematocrit 39.0 - 52.0 % 45.1  47.0  41.0   Platelets 150 - 400 K/uL 233  260      Lab Results  Component Value Date   CHOL 133 06/11/2024   HDL 31 (L) 06/11/2024   LDLCALC 43 06/11/2024   TRIG 294 (H) 06/11/2024   CHOLHDL 4.3 06/11/2024    Lab Results  Component Value Date   TSH 0.972 02/08/2024    Lab Results  Component Value Date   HGBA1C 5.3 06/11/2024    Cardiac Panel (last 3 results) Recent Labs    06/11/24 2314 06/12/24 0032  TROPONINIHS 3,937* 3,681*    BNP (last 3 results) Recent Labs    10/10/23 1907 12/19/23 2027  BNP 11.8 16.6    ProBNP (last 3 results) Recent Labs    01/28/24 1931 04/03/24 1513 04/27/24 1612  PROBNP 72.7 <36.0 157.0     Intake/Output Summary (Last 24 hours) at 06/12/2024 0839 Last data filed at 06/12/2024 0600 Gross per 24 hour  Intake 509.69 ml  Output 2100 ml  Net -1590.31 ml    Net IO Since Admission: -1,590.31 mL [06/12/24 0839]  Tele/EKG/Cardiac studies    Telemetry: NSR  EKG:  EKG 06/11/2024 at 1814 hrs.: Normal sinus rhythm with rate of 73 bpm, hyperacute T wave abnormality inferior leads  and lateral leads with reciprocal ST depression in 1 and aVL, consider acute inferolateral STEMI.  Echocardiogram 05/24/2024:    1. Left ventricular ejection fraction, by estimation, is 60 to 65%. The left ventricle has normal function. Left ventricular endocardial border not optimally defined to evaluate regional wall motion. The left ventricular internal cavity size was mildly  dilated. Left ventricular diastolic parameters are consistent with Grade II diastolic dysfunction (pseudonormalization). The average left ventricular global longitudinal strain is -18.3 %. The global longitudinal strain is normal.  2. Right ventricular systolic function is normal. The right ventricular size is moderately enlarged. Tricuspid regurgitation signal is inadequate for assessing PA pressure.  3. Left atrial size was moderately dilated.  4. The mitral valve is normal in structure. No evidence of mitral valve regurgitation. No evidence of mitral stenosis.  5. The aortic valve is tricuspid. Aortic valve regurgitation is trivial.  CARDIAC CATHETERIZATION 06/11/2024 1.  Diffuse tapering of the mid and distal LAD with angiographic appearance of diffuse intramural hematoma and apical dissection 2.  Widely patent RCA, left main, proximal LAD, and left circumflex/intermediate branches with no significant stenoses 3.  Normal LVEDP    Radiology   Imaging results have been reviewed CT Chest 04/19/24: 1. 2 by 3 mm left lower lobe nodule, poorly seen on the prior exam, surveillance suggested. 2. Known 3.1 cm complex exophytic lesion of the right kidney upper pole, indeterminate on noncontrast CT. Today's chest CT was not optimized to assess the kidneys. No change3 from 08/24/23 3. Mild bilateral airway thickening. 4. Thoracic spondylosis.  Current Meds:     Current Facility-Administered Medications:    0.9 %  sodium chloride  infusion, , Intravenous, Continuous, Wonda Sharper, MD   0.9 %  sodium chloride   infusion, 250 mL, Intravenous, PRN, Cooper, Michael, MD   acetaminophen  (TYLENOL ) tablet 650 mg, 650 mg, Oral, Q4H PRN, Wonda Sharper, MD   amLODipine  (NORVASC ) tablet 10 mg, 10 mg, Oral, BID,  Ladona Heinz, MD   aspirin  EC tablet 81 mg, 81 mg, Oral, Daily, Wonda Sharper, MD   Chlorhexidine  Gluconate Cloth 2 % PADS 6 each, 6 each, Topical, Daily, Doriann Zuch, MD   cloNIDine  (CATAPRES  - Dosed in mg/24 hr) patch 0.2 mg, 0.2 mg, Transdermal, Weekly, Elita Dame, MD   empagliflozin  (JARDIANCE ) tablet 10 mg, 10 mg, Oral, QAC breakfast, Cooper, Michael, MD, 10 mg at 06/12/24 0741   enoxaparin  (LOVENOX ) injection 40 mg, 40 mg, Subcutaneous, Q24H, Wonda Sharper, MD   hydrALAZINE  (APRESOLINE ) tablet 100 mg, 100 mg, Oral, BID, Cooper, Michael, MD, 100 mg at 06/11/24 2217   irbesartan  (AVAPRO ) tablet 150 mg, 150 mg, Oral, Daily, Wonda Sharper, MD   isosorbide  dinitrate (ISORDIL ) tablet 30 mg, 30 mg, Oral, TID, Jeremiah Tarpley, MD   labetalol  (NORMODYNE ) tablet 300 mg, 300 mg, Oral, BID, Cooper, Michael, MD, 300 mg at 06/11/24 2217   morphine  (PF) 2 MG/ML injection 2 mg, 2 mg, Intravenous, Q1H PRN, Wonda Sharper, MD   nitroGLYCERIN  (NITROSTAT ) SL tablet 0.4 mg, 0.4 mg, Sublingual, Q5 min PRN, Wonda Sharper, MD   ondansetron  (ZOFRAN ) injection 4 mg, 4 mg, Intravenous, Q6H PRN, Wonda Sharper, MD   Oral care mouth rinse, 15 mL, Mouth Rinse, PRN, Ladona Heinz, MD   oxyCODONE  (Oxy IR/ROXICODONE ) immediate release tablet 5-10 mg, 5-10 mg, Oral, Q4H PRN, Wonda Sharper, MD   sodium chloride  flush (NS) 0.9 % injection 3 mL, 3 mL, Intravenous, Q12H, Wonda Sharper, MD, 3 mL at 06/11/24 2218   sodium chloride  flush (NS) 0.9 % injection 3 mL, 3 mL, Intravenous, PRN, Wonda Sharper, MD   spironolactone  (ALDACTONE ) tablet 25 mg, 25 mg, Oral, Daily, Wonda Sharper, MD  Assessment & Plan .     1.  STEMI inferolaterally secondary to scad involving mid to distal LAD. 2.  Hypertensive urgency 3.  Stage III chronic  kidney disease secondary to hypertension 4.  Morbid obesity with obesity hypoventilation 5.  OSA on CPAP 6.  Tobacco use disorder  Recommendation: Will discontinue aspirin  in view of scad.  No significant coronary disease was noted by cardiac catheterization.  Needs optimization of hypertension, will increase his amlodipine  from 5 mg to 10 mg daily and add Catapres  patch 0.2 mg q. weekly along with continued labetalol  300 mg p.o. twice daily, hydralazine  100 mg p.o. 3 times daily and will change isosorbide  mononitrate to isosorbide  dinitrate 30 mg p.o. 3 times daily.  Continue Avapro  150 mg daily along with spironolactone  25 mg daily.  Suspect his hypotension is related to poor dietary habits and morbid obesity.  I have discussed with the patient regarding lifestyle modification.  With regard to OSA, he is presently using CPAP regularly and encouraged him to continue to do so.  Smoking cessation discussed with the patient.  Advised him that he will have high risk of recurrence of scad if he continues to smoke.  Transfer patient to the floor, will continue to follow, hopefully home tomorrow if blood pressure is controlled.   For questions or updates, please contact [facility] Please consult [email or web address] for contact info under        Signed,   Heinz Ladona, MD, [facility] 06/12/2024, 8:39 AM [facility] [address] [place], [place] [address] Phone: [phone]. Fax:  [phone]

## 2024-06-13 ENCOUNTER — Other Ambulatory Visit (HOSPITAL_COMMUNITY): Payer: Self-pay

## 2024-06-13 ENCOUNTER — Telehealth: Payer: Self-pay | Admitting: Cardiology

## 2024-06-13 ENCOUNTER — Ambulatory Visit: Admitting: Pharmacist Clinician (PhC)/ Clinical Pharmacy Specialist

## 2024-06-13 DIAGNOSIS — Z720 Tobacco use: Secondary | ICD-10-CM

## 2024-06-13 MED ORDER — ISOSORB DINITRATE-HYDRALAZINE 20-37.5 MG PO TABS
2.0000 | ORAL_TABLET | Freq: Three times a day (TID) | ORAL | 2 refills | Status: DC
  Filled 2024-06-13 – 2024-07-13 (×2): qty 180, 30d supply, fill #0
  Filled 2024-08-16: qty 180, 30d supply, fill #1

## 2024-06-13 MED ORDER — NICOTINE 14 MG/24HR TD PT24
14.0000 mg | MEDICATED_PATCH | Freq: Every day | TRANSDERMAL | 0 refills | Status: AC
  Filled 2024-06-13: qty 28, 28d supply, fill #0

## 2024-06-13 MED ORDER — AMLODIPINE BESYLATE 10 MG PO TABS
10.0000 mg | ORAL_TABLET | Freq: Every day | ORAL | 3 refills | Status: AC
  Filled 2024-06-13 – 2024-07-13 (×2): qty 30, 30d supply, fill #0
  Filled 2024-08-16: qty 30, 30d supply, fill #1
  Filled 2024-09-17: qty 30, 30d supply, fill #2
  Filled 2024-10-19: qty 30, 30d supply, fill #3

## 2024-06-13 MED ORDER — ISOSORB DINITRATE-HYDRALAZINE 20-37.5 MG PO TABS
1.0000 | ORAL_TABLET | Freq: Three times a day (TID) | ORAL | 3 refills | Status: DC
  Filled 2024-06-13: qty 90, 30d supply, fill #0

## 2024-06-13 MED ORDER — CLONIDINE HCL 0.1 MG PO TABS
0.1000 mg | ORAL_TABLET | Freq: Three times a day (TID) | ORAL | 2 refills | Status: DC
  Filled 2024-06-13: qty 60, 20d supply, fill #0
  Filled 2024-07-03: qty 60, 20d supply, fill #1
  Filled 2024-07-27: qty 60, 20d supply, fill #2

## 2024-06-13 MED ORDER — CLONIDINE HCL 0.1 MG PO TABS
0.1000 mg | ORAL_TABLET | Freq: Three times a day (TID) | ORAL | Status: DC
Start: 2024-06-13 — End: 2024-06-13
  Administered 2024-06-13: 0.1 mg via ORAL
  Filled 2024-06-13: qty 1

## 2024-06-13 NOTE — Progress Notes (Signed)
 Discharge instructions went over with patient again after Cardiac Rehab and pharmacy both talked with patient. All questions answered and support given.

## 2024-06-13 NOTE — Progress Notes (Addendum)
 Pt has been ambulating with staff without chest pain.  Educated pt on MI, SCAD, post MI activity restrictions, exercise guidelines, heart-healthy eating, NTG use, tobacco cessation, and CRPII.  MI book and tobacco cessation resources provided to pt.  Pt motivated to quit smoking.  Will place referral for [facility] CRPII.    Cardiac Rehab Phase 1 JD Lennon, [place] BSN [phone] 06/13/24 1:04 PM

## 2024-06-13 NOTE — Telephone Encounter (Signed)
   Transition of Care Follow-up Phone Call Request    Patient Name: Calvin Bailey Date of Birth: 04-11-1981 Date of Encounter: 06/13/2024  Primary Care Provider:  [place], Quintin PARAS, MD Primary Cardiologist:  Kardie Tobb, DO  Calvin Bailey  Please reach out to Calvin Bailey within 48 hours of discharge to confirm appointment and review transition of care protocol questionnaire. Anticipated discharge date: 9/17  Manuelita Rummer, NP  06/13/2024, 11:29 AM

## 2024-06-13 NOTE — Plan of Care (Signed)
  Problem: Clinical Measurements: Goal: Respiratory complications will improve Outcome: Progressing   Problem: Clinical Measurements: Goal: Cardiovascular complication will be avoided Outcome: Progressing   Problem: Clinical Measurements: Goal: Diagnostic test results will improve Outcome: Progressing   Problem: Clinical Measurements: Goal: Will remain free from infection Outcome: Progressing   Problem: Education: Goal: Knowledge of General Education information will improve Description: Including pain rating scale, medication(s)/side effects and non-pharmacologic comfort measures Outcome: Progressing

## 2024-06-13 NOTE — Plan of Care (Signed)
Discharge instructions given to patient.  Questions answered and support given.

## 2024-06-13 NOTE — Discharge Summary (Addendum)
 Discharge Summary   Patient ID: Calvin Bailey MRN: [id number]; DOB: Jun 10, 1981  Admit date: 06/11/2024 Discharge date: 06/13/2024  PCP:  [place], Calvin Bailey   [facility] Providers Cardiologist:  Calvin Huntsman, DO    Discharge Diagnoses  Principal Problem:   ST elevation myocardial infarction involving left anterior descending (LAD) coronary artery [facility]) Active Problems:   Hypertensive urgency   Spontaneous dissection of coronary artery   Diagnostic Studies/Procedures   Cath: 06/11/2024  1.  Diffuse tapering of the mid and distal LAD with angiographic appearance of diffuse intramural hematoma and apical dissection 2.  Widely patent RCA, left main, proximal LAD, and left circumflex/intermediate branches with no significant stenoses 3.  Normal LVEDP   After review of his films in comparison to his old study, the patient may have both coronary vasospasm and currently with spontaneous coronary artery dissection.  On today's study, the apical LAD is involved and since the LAD wraps the apex and supplies the distal inferior wall I think this is the etiology of his inferior ST elevation. Plan medical therapy.  Diagnostic Dominance: Right   Echo: 06/12/2024  IMPRESSIONS     1. Left ventricular ejection fraction, by estimation, is 60 to 65%. The  left ventricle has normal function. The left ventricle has no regional  wall motion abnormalities. There is mild left ventricular hypertrophy.  Left ventricular diastolic parameters  were normal.   2. Right ventricular systolic function is normal. The right ventricular  size is normal.   3. The mitral valve is normal in structure. No evidence of mitral valve  regurgitation. No evidence of mitral stenosis.   4. The aortic valve is normal in structure. Aortic valve regurgitation is  not visualized. No aortic stenosis is present.   5. The inferior vena cava is normal in size with greater than 50%  respiratory variability,  suggesting right atrial pressure of 3 mmHg.   FINDINGS   Left Ventricle: Left ventricular ejection fraction, by estimation, is 60  to 65%. The left ventricle has normal function. The left ventricle has no  regional wall motion abnormalities. The left ventricular internal cavity  size was normal in size. There is   mild left ventricular hypertrophy. Left ventricular diastolic parameters  were normal.   Right Ventricle: The right ventricular size is normal. No increase in  right ventricular wall thickness. Right ventricular systolic function is  normal.   Left Atrium: Left atrial size was normal in size.   Right Atrium: Right atrial size was normal in size.   Pericardium: There is no evidence of pericardial effusion.   Mitral Valve: The mitral valve is normal in structure. No evidence of  mitral valve regurgitation. No evidence of mitral valve stenosis.   Tricuspid Valve: The tricuspid valve is normal in structure. Tricuspid  valve regurgitation is not demonstrated. No evidence of tricuspid  stenosis.   Aortic Valve: The aortic valve is normal in structure. Aortic valve  regurgitation is not visualized. No aortic stenosis is present.   Pulmonic Valve: The pulmonic valve was normal in structure. Pulmonic valve  regurgitation is not visualized. No evidence of pulmonic stenosis.   Aorta: The aortic root is normal in size and structure.   Venous: The inferior vena cava is normal in size with greater than 50%  respiratory variability, suggesting right atrial pressure of 3 mmHg.   IAS/Shunts: No atrial level shunt detected by color flow Doppler.   _____________   History of Present Illness  Calvin Bailey is a 43 y.o. male with HTN and morbid obesity who was seen 06/11/2024 for the evaluation of STEMI. [name] has a hx of nonobstructive CAD, now presented with acute inferior STEMI. His chest pain began abruptly at 6pm the night of admission when he was helping his son put on  shoulder pads for football practice. He described a severe ache that started over the left chest and radiated to the jaw. There was associated shortness of breath. No diaphoresis or lightheadedness. He felt similar symptoms in the past but never this severe. His pain has improved but persisted on arrival to the cath lab.    He underwent cardiac cath in August 2025 showing ostial RCA spasm, but no other significant CAD was noted. RHC showed elevated left and right heart diastolic filling pressures. He has been taking his antihypertensive medications as prescribed. No other complaints.   Hospital Course    STEMI SCAD -- Underwent cardiac catheterization/15 with diffuse tapering of mid/distal LAD with appearance of intramural hematoma and a dissection.  Recommendations for continued medical therapy. ASA was stopped per Bailey prior to discharge.   Hypertension -- Continue labetalol  300 mg twice daily, spironolactone  25 mg daily, valsartan  160 mg daily amlodipine  10 mg daily, BiDil  2 tablets 3 times daily, clonidine  0.1 mg 3 times a day  Stage III CKD -- Stable creatinine at 1.6  Tobacco abuse -- Cessation advised, ordered for nicotine  patches  OSA on CPAP  Patient was seen by Dr. Ladona and deemed stable for discharge home. Follow up arranged in the office.  Did the patient have an acute coronary syndrome (MI, NSTEMI, STEMI, etc) this admission?:  Yes                               AHA/ACC ACS Clinical Performance & Quality Measures: Aspirin  prescribed? - No - SCAD, no ASA per Bailey ADP Receptor Inhibitor (Plavix/Clopidogrel, Brilinta/Ticagrelor or Effient/Prasugrel) prescribed (includes medically managed patients)? - No - SCAD, no plavix per Bailey Beta Blocker prescribed? - Yes High Intensity Statin (Lipitor 40-80mg  or Crestor 20-40mg ) prescribed? - No - SCAD EF assessed during THIS hospitalization? - Yes For EF <40%, was ACEI/ARB prescribed? - Not Applicable (EF >/= 40%) For EF <40%, Aldosterone  Antagonist (Spironolactone  or Eplerenone) prescribed? - Not Applicable (EF >/= 40%) Cardiac Rehab Phase II ordered (including medically managed patients)? - Yes    The patient will be scheduled for a TOC follow up appointment in 10-14 days.  A message has been sent to the [facility] and Scheduling Pool at the office where the patient should be seen for follow up.  _____________  Discharge Vitals Blood pressure (!) 141/70, pulse 68, temperature 97.9 F (36.6 C), temperature source Oral, resp. rate 15, height 6' 1 (1.854 m), weight (!) 175.2 kg, SpO2 94%.  Filed Weights   06/11/24 1811 06/11/24 2313 06/13/24 0500  Weight: (!) 172.4 kg (!) 172 kg (!) 175.2 kg    Labs & Radiologic Studies  CBC Recent Labs    06/11/24 1822 06/12/24 0032  WBC 9.7 8.4  NEUTROABS 6.5  --   HGB 15.7 15.2  HCT 47.0 45.1  MCV 85.6 86.1  PLT 260 233   Basic Metabolic Panel Recent Labs    90/84/74 1822 06/11/24 2314  NA 138  --   K 4.5  --   CL 101  --   CO2 23  --   GLUCOSE  100*  --   BUN 21*  --   CREATININE 1.73* 1.60*  CALCIUM  10.2  --    Liver Function Tests Recent Labs    06/11/24 1822  AST 22  ALT 21  ALKPHOS 116  BILITOT 0.4  PROT 8.3*  ALBUMIN 4.6   No results for input(s): LIPASE, AMYLASE in the last 72 hours. High Sensitivity Troponin:   Recent Labs  Lab 06/11/24 2314 06/12/24 0032  TROPONINIHS 3,937* 3,681*    Recent Labs  Lab 06/11/24 1826  TRNPT 17    BNP Invalid input(s): POCBNP No results for input(s): PROBNP in the last 72 hours.  No results for input(s): BNP in the last 72 hours.  D-Dimer No results for input(s): DDIMER in the last 72 hours. Hemoglobin A1C Recent Labs    06/11/24 1850  HGBA1C 5.3   Fasting Lipid Panel Recent Labs    06/11/24 1822  CHOL 133  HDL 31*  LDLCALC 43  TRIG 705*  CHOLHDL 4.3   No results found for: LIPOA  Thyroid  Function Tests No results for input(s): TSH, T4TOTAL, T3FREE, THYROIDAB in the  last 72 hours.  Invalid input(s): FREET3 _____________  ECHOCARDIOGRAM COMPLETE Result Date: 06/12/2024    ECHOCARDIOGRAM REPORT   Patient Name:   Calvin Bailey Date of Exam: 06/12/2024 Medical Rec #:  [id number]        Height:       73.0 in Accession #:    [id number]       Weight:       379.2 lb Date of Birth:  1980/10/17        BSA:          2.824 m Patient Age:    42 years         BP:           168/108 mmHg Patient Gender: M                HR:           63 bpm. Exam Location:  Inpatient Procedure: 2D Echo (Both Spectral and Color Flow Doppler were utilized during            procedure). Indications:    Acute Ischemic Heart Disease  History:        Patient has prior history of Echocardiogram examinations. Risk                 Factors:Hypertension.  Sonographer:    Charmaine Gaskins Referring Phys: [phone] MICHAEL COOPER IMPRESSIONS  1. Left ventricular ejection fraction, by estimation, is 60 to 65%. The left ventricle has normal function. The left ventricle has no regional wall motion abnormalities. There is mild left ventricular hypertrophy. Left ventricular diastolic parameters were normal.  2. Right ventricular systolic function is normal. The right ventricular size is normal.  3. The mitral valve is normal in structure. No evidence of mitral valve regurgitation. No evidence of mitral stenosis.  4. The aortic valve is normal in structure. Aortic valve regurgitation is not visualized. No aortic stenosis is present.  5. The inferior vena cava is normal in size with greater than 50% respiratory variability, suggesting right atrial pressure of 3 mmHg. FINDINGS  Left Ventricle: Left ventricular ejection fraction, by estimation, is 60 to 65%. The left ventricle has normal function. The left ventricle has no regional wall motion abnormalities. The left ventricular internal cavity size was normal in size. There is  mild left ventricular hypertrophy. Left ventricular diastolic parameters were  normal. Right Ventricle:  The right ventricular size is normal. No increase in right ventricular wall thickness. Right ventricular systolic function is normal. Left Atrium: Left atrial size was normal in size. Right Atrium: Right atrial size was normal in size. Pericardium: There is no evidence of pericardial effusion. Mitral Valve: The mitral valve is normal in structure. No evidence of mitral valve regurgitation. No evidence of mitral valve stenosis. Tricuspid Valve: The tricuspid valve is normal in structure. Tricuspid valve regurgitation is not demonstrated. No evidence of tricuspid stenosis. Aortic Valve: The aortic valve is normal in structure. Aortic valve regurgitation is not visualized. No aortic stenosis is present. Pulmonic Valve: The pulmonic valve was normal in structure. Pulmonic valve regurgitation is not visualized. No evidence of pulmonic stenosis. Aorta: The aortic root is normal in size and structure. Venous: The inferior vena cava is normal in size with greater than 50% respiratory variability, suggesting right atrial pressure of 3 mmHg. IAS/Shunts: No atrial level shunt detected by color flow Doppler.  LEFT VENTRICLE PLAX 2D LVIDd:         5.90 cm      Diastology LVIDs:         3.70 cm      LV e' medial:    7.29 cm/s LV PW:         1.40 cm      LV E/e' medial:  11.1 LV IVS:        1.30 cm      LV e' lateral:   9.03 cm/s LVOT diam:     2.40 cm      LV E/e' lateral: 9.0 LVOT Area:     4.52 cm  LV Volumes (MOD) LV vol d, MOD A2C: 176.0 ml LV vol d, MOD A4C: 191.0 ml LV vol s, MOD A2C: 49.3 ml LV vol s, MOD A4C: 74.4 ml LV SV MOD A2C:     126.7 ml LV SV MOD A4C:     191.0 ml LV SV MOD BP:      126.8 ml RIGHT VENTRICLE RV Basal diam:  4.10 cm RV Mid diam:    3.40 cm RV S prime:     15.20 cm/s TAPSE (M-mode): 2.8 cm LEFT ATRIUM              Index        RIGHT ATRIUM           Index LA diam:        3.60 cm  1.27 cm/m   RA Area:     21.20 cm LA Vol (A2C):   79.7 ml  28.22 ml/m  RA Volume:   58.20 ml  20.61 ml/m LA Vol  (A4C):   100.0 ml 35.41 ml/m LA Biplane Vol: 91.9 ml  32.54 ml/m   AORTA Ao Root diam: 3.30 cm Ao Asc diam:  3.70 cm MITRAL VALVE MV Area (PHT): 3.70 cm    SHUNTS MV Decel Time: 205 msec    Systemic Diam: 2.40 cm MV E velocity: 81.20 cm/s MV A velocity: 98.50 cm/s MV E/A ratio:  0.82 Aditya Sabharwal Electronically signed by Ria Commander Signature Date/Time: 06/12/2024/5:50:54 PM    Final    CARDIAC CATHETERIZATION Result Date: 06/11/2024 1.  Diffuse tapering of the mid and distal LAD with angiographic appearance of diffuse intramural hematoma and apical dissection 2.  Widely patent RCA, left main, proximal LAD, and left circumflex/intermediate branches with no significant stenoses 3.  Normal LVEDP After review of his films in  comparison to his old study, the patient may have both coronary vasospasm and currently with spontaneous coronary artery dissection.  On today's study, the apical LAD is involved and since the LAD wraps the apex and supplies the distal inferior wall I think this is the etiology of his inferior ST elevation. Plan medical therapy.   ECHOCARDIOGRAM COMPLETE Result Date: 05/24/2024    ECHOCARDIOGRAM REPORT   Patient Name:   Calvin Bailey Date of Exam: 05/24/2024 Medical Rec #:  [id number]        Height:       73.0 in Accession #:    [id number]       Weight:       402.0 lb Date of Birth:  03/29/1981        BSA:          2.895 m Patient Age:    42 years         BP:           147/87 mmHg Patient Gender: M                HR:           68 bpm. Exam Location:  [address] Procedure: 2D Echo, Cardiac Doppler, Color Doppler and Strain Analysis (Both            Spectral and Color Flow Doppler were utilized during procedure). Indications:    R06.02 Shortness of Breath  History:        Patient has prior history of Echocardiogram examinations, most                 recent 11/20/2022. CHF, Signs/Symptoms:Shortness of Breath,                 Edema, Dizziness/Lightheadedness and Chest Pain; Risk                  Factors:Sleep Apnea, Hypertension, Dyslipidemia, Family History                 of Coronary Artery Disease and Current Smoker. Obesity, Chronic                 Kidney Disease.  Sonographer:    Heather Hawks RDCS Referring Phys: SHERI HAMMOCK IMPRESSIONS  1. Left ventricular ejection fraction, by estimation, is 60 to 65%. The left ventricle has normal function. Left ventricular endocardial border not optimally defined to evaluate regional wall motion. The left ventricular internal cavity size was mildly dilated. Left ventricular diastolic parameters are consistent with Grade II diastolic dysfunction (pseudonormalization). The average left ventricular global longitudinal strain is -18.3 %. The global longitudinal strain is normal.  2. Right ventricular systolic function is normal. The right ventricular size is moderately enlarged. Tricuspid regurgitation signal is inadequate for assessing PA pressure.  3. Left atrial size was moderately dilated.  4. The mitral valve is normal in structure. No evidence of mitral valve regurgitation. No evidence of mitral stenosis.  5. The aortic valve is tricuspid. Aortic valve regurgitation is trivial. Aortic valve sclerosis/calcification is present, without any evidence of aortic stenosis. Aortic valve area, by VTI measures 4.06 cm. Aortic valve mean gradient measures 9.8 mmHg.  Aortic valve Vmax measures 2.12 m/s.  6. The inferior vena cava is normal in size with greater than 50% respiratory variability, suggesting right atrial pressure of 3 mmHg. FINDINGS  Left Ventricle: Left ventricular ejection fraction, by estimation, is 60 to 65%. The left ventricle has normal function. Left ventricular endocardial border  not optimally defined to evaluate regional wall motion. The average left ventricular global longitudinal strain is -18.3 %. Strain was performed and the global longitudinal strain is normal. The left ventricular internal cavity size was mildly dilated. There  is no left ventricular hypertrophy. Left ventricular diastolic parameters are consistent with Grade II diastolic dysfunction (pseudonormalization). Normal left ventricular filling pressure. Right Ventricle: The right ventricular size is moderately enlarged. No increase in right ventricular wall thickness. Right ventricular systolic function is normal. Tricuspid regurgitation signal is inadequate for assessing PA pressure. Left Atrium: Left atrial size was moderately dilated. Right Atrium: Right atrial size was normal in size. Pericardium: There is no evidence of pericardial effusion. Mitral Valve: The mitral valve is normal in structure. Mild mitral annular calcification. No evidence of mitral valve regurgitation. No evidence of mitral valve stenosis. Tricuspid Valve: The tricuspid valve is normal in structure. Tricuspid valve regurgitation is not demonstrated. No evidence of tricuspid stenosis. Aortic Valve: The aortic valve is tricuspid. Aortic valve regurgitation is trivial. Aortic valve sclerosis/calcification is present, without any evidence of aortic stenosis. Aortic valve mean gradient measures 9.8 mmHg. Aortic valve peak gradient measures 17.9 mmHg. Aortic valve area, by VTI measures 4.06 cm. Pulmonic Valve: The pulmonic valve was normal in structure. Pulmonic valve regurgitation is not visualized. No evidence of pulmonic stenosis. Aorta: The aortic root is normal in size and structure. Venous: The inferior vena cava is normal in size with greater than 50% respiratory variability, suggesting right atrial pressure of 3 mmHg. IAS/Shunts: No atrial level shunt detected by color flow Doppler.  LEFT VENTRICLE PLAX 2D LVIDd:         6.20 cm   Diastology LVIDs:         3.20 cm   LV e' medial:    7.18 cm/s LV PW:         1.40 cm   LV E/e' medial:  12.0 LV IVS:        0.90 cm   LV e' lateral:   11.30 cm/s LVOT diam:     2.60 cm   LV E/e' lateral: 7.7 LV SV:         165 LV SV Index:   57        2D Longitudinal Strain  LVOT Area:     5.31 cm  2D Strain GLS (A4C):   -19.1 %                          2D Strain GLS (A3C):   -16.4 %                          2D Strain GLS (A2C):   -19.5 %                          2D Strain GLS Avg:     -18.3 % RIGHT VENTRICLE RV Basal diam:  4.95 cm RV Mid diam:    4.30 cm RV S prime:     17.90 cm/s TAPSE (M-mode): 2.9 cm LEFT ATRIUM              Index        RIGHT ATRIUM           Index LA diam:        4.50 cm  1.55 cm/m   RA Pressure: 3.00 mmHg LA Vol (A2C):   146.0 ml  50.43 ml/m  RA Area:     26.80 cm LA Vol (A4C):   85.7 ml  29.60 ml/m  RA Volume:   91.30 ml  31.54 ml/m LA Biplane Vol: 117.0 ml 40.42 ml/m  AORTIC VALVE AV Area (Vmax):    3.69 cm AV Area (Vmean):   3.75 cm AV Area (VTI):     4.06 cm AV Vmax:           211.75 cm/s AV Vmean:          143.750 cm/s AV VTI:            0.407 m AV Peak Grad:      17.9 mmHg AV Mean Grad:      9.8 mmHg LVOT Vmax:         147.33 cm/s LVOT Vmean:        101.667 cm/s LVOT VTI:          0.311 m LVOT/AV VTI ratio: 0.76  AORTA Ao Root diam: 3.30 cm Ao Asc diam:  3.60 cm MITRAL VALVE               TRICUSPID VALVE MV Area (PHT)  cm         Estimated RAP:  3.00 mmHg MV Decel Time: 229 msec MV E velocity: 86.48 cm/s  SHUNTS MV A velocity: 88.55 cm/s  Systemic VTI:  0.31 m MV E/A ratio:  0.98        Systemic Diam: 2.60 cm Wilbert Bihari Bailey Electronically signed by Wilbert Bihari Bailey Signature Date/Time: 05/24/2024/9:24:09 AM    Final    CARDIAC CATHETERIZATION Result Date: 05/18/2024 Conclusions: 70% ostial/proximal RCA stenosis that improves to 30% with intracoronary nitroglycerin  suggesting a significant component of vasospasm.  Otherwise, no angiographically significant coronary artery disease. Upper normal to mildly elevated left heart filling pressures (PCWP 17, LVEDP 18 mmHg). Moderately elevated right heart filling pressures (mean RA 13, RV 43/14 mmHg). Mild pulmonary hypertension (PA 40/19, mean 26 mmHg). Normal Fick cardiac output/index (CO 8.2 L/min,  CI 2.8 L/min/m). Moderate aortic valve/LVOT peak to peak gradient (24 mmHg). Recommendations: Continue medical therapy and risk factor modification to prevent progression of CAD.  Continue isosorbide  mononitrate and amlodipine  for treatment of possible component of coronary vasospasm. Proceed with echocardiogram for further assessment of possible LVOT/aortic valve gradient.  If echo is inconclusive, cardiac MRI may be helpful. Maintain net even to slightly negative fluid balance. Avoid nephrotoxic agents, including NSAIDs. Lonni Hanson, Bailey [facility]   Disposition Pt is being discharged home today in good condition.  Follow-up Plans & Appointments  Follow-up Information     [place], Quintin PARAS, Bailey Follow up.   Specialty: Family Medicine Why: please contact PCP to schedule hospital follow up appt Contact information: [address] [place] [address] [phone]                Discharge Instructions     AMB referral to Phase II Cardiac Rehabilitation   Complete by: As directed    Diagnosis: STEMI   After initial evaluation and assessments completed: Virtual Based Care may be provided alone or in conjunction with Phase 2 Cardiac Rehab based on patient barriers.: Yes   Intensive Cardiac Rehabilitation (ICR) [facility] location only OR Traditional Cardiac Rehabilitation (TCR) *If criteria for ICR are not met will enroll in TCR [facility] only): Yes       Discharge Medications Allergies as of 06/13/2024       Reactions   Hydrochlorothiazide  Other (See  Comments)   Erectile dysfunction    Lisinopril  Cough   Coughing up blood        Medication List     STOP taking these medications    hydrALAZINE  100 MG tablet Commonly known as: APRESOLINE    isosorbide  mononitrate 30 MG 24 hr tablet Commonly known as: IMDUR        TAKE these medications    acetaminophen  500 MG tablet Commonly known as: TYLENOL  Take 1 tablet (500 mg total) by mouth every 6 (six) hours as  needed.   albuterol  108 (90 Base) MCG/ACT inhaler Commonly known as: VENTOLIN  HFA Inhale 2 puffs into the lungs every 6 (six) hours as needed for wheezing or shortness of breath.   amLODipine  10 MG tablet Commonly known as: NORVASC  Take 1 tablet (10 mg total) by mouth daily. Start taking on: June 14, 2024 What changed:  medication strength how much to take when to take this   cloNIDine  0.1 MG tablet Commonly known as: CATAPRES  Take 1 tablet (0.1 mg total) by mouth 3 (three) times daily.   furosemide  20 MG tablet Commonly known as: LASIX  Take 1 tablet (20 mg total) by mouth daily as needed for fluid or edema.   isosorbide -hydrALAZINE  20-37.5 MG tablet Commonly known as: BIDIL  Take 2 tablets by mouth 3 (three) times daily.   Jardiance  10 MG Tabs tablet Generic drug: empagliflozin  Take 1 tablet (10 mg total) by mouth daily before breakfast.   labetalol  300 MG tablet Commonly known as: NORMODYNE  TAKE 1 TABLET(300 MG) BY MOUTH TWICE DAILY   nicotine  14 mg/24hr patch Commonly known as: NICODERM CQ  - dosed in mg/24 hours Place 1 patch (14 mg total) onto the skin daily. Start taking on: June 14, 2024   nitroGLYCERIN  0.4 MG SL tablet Commonly known as: Nitrostat  Place 1 tablet (0.4 mg total) under the tongue every 5 (five) minutes as needed for chest pain.   spironolactone  25 MG tablet Commonly known as: ALDACTONE  TAKE 1 TABLET(25 MG) BY MOUTH DAILY   valsartan  160 MG tablet Commonly known as: DIOVAN  TAKE 1 TABLET(160 MG) BY MOUTH TWICE DAILY         Outstanding Labs/Studies  N/a   Duration of Discharge Encounter: APP Time: 20 minutes   Signed, Manuelita Rummer, NP 06/13/2024, 12:30 PM

## 2024-06-14 ENCOUNTER — Telehealth: Payer: Self-pay | Admitting: Pharmacy Technician

## 2024-06-14 ENCOUNTER — Telehealth: Payer: Self-pay | Admitting: Pharmacist

## 2024-06-14 ENCOUNTER — Telehealth (HOSPITAL_COMMUNITY): Payer: Self-pay

## 2024-06-14 ENCOUNTER — Other Ambulatory Visit (HOSPITAL_COMMUNITY): Payer: Self-pay

## 2024-06-14 ENCOUNTER — Ambulatory Visit: Attending: Cardiology | Admitting: Pharmacist

## 2024-06-14 ENCOUNTER — Encounter: Payer: Self-pay | Admitting: Pharmacist

## 2024-06-14 MED ORDER — ZEPBOUND 2.5 MG/0.5ML ~~LOC~~ SOAJ
2.5000 mg | SUBCUTANEOUS | 0 refills | Status: DC
Start: 2024-06-14 — End: 2024-07-04
  Filled 2024-06-14: qty 2, 28d supply, fill #0

## 2024-06-14 NOTE — Telephone Encounter (Signed)
 PA approved see other encounter for more info

## 2024-06-14 NOTE — Telephone Encounter (Signed)
 Called patient to see if he is interested in the Cardiac Rehab Program. Patient expressed interest. Explained scheduling process and went over insurance, patient verbalized understanding. Will contact patient for scheduling once f/u has been completed.

## 2024-06-14 NOTE — Assessment & Plan Note (Signed)
 Patient has not met goal of at least 5% of body weight loss with comprehensive lifestyle modifications alone in the past 3-6 months. Pharmacotherapy is appropriate to pursue as augmentation. Tried Wegovy  in the past but was not able to tolerate it due to side effects. Pt admits he was not following good diet when he was on Wegovy . This time around he is ready to make dietary changes. PA for Zepbound  has been approved Will start Zepbound  2.5 mg once a week  . Confirmed patient has personal or family history of medullary thyroid  carcinoma (MTC) or Multiple Endocrine Neoplasia syndrome type 2 (MEN 2). Injection technique reviewed at today's visit.  Advised patient on common side effects including nausea, diarrhea, dyspepsia, decreased appetite, and fatigue. Counseled patient on reducing meal size and how to titrate medication to minimize side effects. Counseled patient to call if intolerable side effects or if experiencing dehydration, abdominal pain, or dizziness. Along with pharmacotherapy, the patient will follow dietary modifications and aim for at least 150 minutes of moderate-intensity exercise per week, plus resistance training twice a week (as recommended by the American Heart Association). This resistance training--such as weightlifting, bodyweight exercises, or using resistance bands, adapted to the patient's ability--will help prevent muscle loss.  Phone follow-ups will be conducted every 4 weeks for dose titration until the patient reaches the effective therapeutic dose and target weight.

## 2024-06-14 NOTE — Telephone Encounter (Signed)
 Pt insurance is active and benefits verified through [facility] Co-pay $35, DED 0/0 met, out of pocket $3,000/$3,000 met, co-insurance 0%. no pre-authorization required, Peter/[employer] 06/14/2024@1 :59, REF# [id number]  TCR/ICR? ICR Visit(date of service)limitation? 36 visit limit Can multiple codes be used on the same date of service/visit?(IF ITS A LIMIT) yes  Is this a lifetime maximum or an annual maximum? annual Has the member used any of these services to date? no Is there a time limit (weeks/months) on start of program and/or program completion? no  Will contact patient to see if he is interested in the Cardiac Rehab Program. If interested, patient will need to complete follow up appt. Once completed, patient will be contacted for scheduling upon review by the RN Navigator.

## 2024-06-14 NOTE — Progress Notes (Signed)
 Patient ID: Calvin Bailey                 DOB: 10/30/1980                    MRN: [id number]     HPI: Calvin Bailey is a 43 y.o. male patient referred to pharmacy clinic by Dr.Tobb to initiate GLP1-RA therapy. PMH is significant for OSA on CPAP, hypertension, CAD, recent STEMI,CKD, tobacco abuse, chronic HFpEF,and obesity. Most recent BMI 50.96 kg/m .  Baseline weight and BMI: 392 lbs 51.97  kg/m  Current weight and BMI: 386 lbs 50.96 kg/m  Current meds that affect weight: none    Diet:  B- egg wrap  L - salads grilled chicken - dressing  D- vegetables, spaghetti squash, mushroom,  Snacks: chitose, picked okra  Drink:  water , orange juice   Exercise: none due to recent STEMI - will be going to PT sessions in few weeks     Social History:  Smoking: quit 06/11/2024  Drinking: none  Labs: Lab Results  Component Value Date   HGBA1C 5.3 06/11/2024    Wt Readings from Last 1 Encounters:  06/14/24 (!) 389 lb (176.4 kg)    BP Readings from Last 1 Encounters:  06/14/24 102/67   Pulse Readings from Last 1 Encounters:  06/14/24 64       Component Value Date/Time   CHOL 133 06/11/2024 1822   CHOL 149 05/30/2019 1500   TRIG 294 (H) 06/11/2024 1822   HDL 31 (L) 06/11/2024 1822   HDL 34 (L) 05/30/2019 1500   CHOLHDL 4.3 06/11/2024 1822   VLDL 59 (H) 06/11/2024 1822   LDLCALC 43 06/11/2024 1822   LDLCALC 93 05/30/2019 1500    Past Medical History:  Diagnosis Date   Arthritis    Cancer (HCC)    right kidney   CHF (congestive heart failure) (HCC)    pt. 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 30 tablet 0   albuterol  (VENTOLIN  HFA) 108 (90 Base) MCG/ACT inhaler Inhale 2 puffs into the lungs every 6 (six) hours as needed for wheezing or shortness of breath. 8 g 10   amLODipine  (NORVASC ) 10 MG tablet Take 1 tablet (10 mg total) by mouth daily. 90 tablet 3   cloNIDine  (CATAPRES ) 0.1 MG tablet Take 1 tablet (0.1 mg total) by mouth 3 (three) times daily. 60 tablet 2   empagliflozin  (JARDIANCE ) 10 MG TABS tablet Take 1 tablet (10 mg total) by mouth daily before breakfast. 30 tablet 11   furosemide  (LASIX ) 20 MG tablet Take 1 tablet (20 mg total) by mouth daily as needed for fluid or edema.     isosorbide -hydrALAZINE  (BIDIL ) 20-37.5 MG tablet Take 2 tablets by mouth 3 (three) times daily. 180 tablet 2   labetalol  (NORMODYNE ) 300 MG tablet TAKE 1 TABLET(300 MG) BY MOUTH TWICE DAILY 180 tablet 1   nicotine  (NICODERM CQ  - DOSED  IN MG/24 HOURS) 14 mg/24hr patch Place 1 patch (14 mg total) onto the skin daily. 28 patch 0   nitroGLYCERIN  (NITROSTAT ) 0.4 MG SL tablet Place 1 tablet (0.4 mg total) under the tongue every 5 (five) minutes as needed for chest pain. 25 tablet PRN   spironolactone  (ALDACTONE ) 25 MG tablet TAKE 1 TABLET(25 MG) BY MOUTH DAILY 30 tablet 11   valsartan  (DIOVAN ) 160 MG tablet TAKE 1 TABLET(160 MG) BY MOUTH TWICE DAILY 180 tablet 3   [DISCONTINUED] LISINOPRIL -HYDROCHLOROTHIAZIDE  PO Take by mouth.     No current facility-administered medications on file prior to visit.    Allergies  Allergen Reactions   Hydrochlorothiazide  Other (See Comments)    Erectile dysfunction    Lisinopril  Cough    Coughing up blood     Assessment/Plan:  1. Weight loss -   Problem  Morbid Obesity Due to Excess Calories (Hcc)   Morbid obesity due to excess calories [facility]) Patient has not met goal of at least 5% of body weight loss with comprehensive  lifestyle modifications alone in the past 3-6 months. Pharmacotherapy is appropriate to pursue as augmentation. Tried Wegovy  in the past but was not able to tolerate it due to side effects. Pt admits he was not following good diet when he was on Wegovy . This time around he is ready to make dietary changes. PA for Zepbound  has been approved Will start Zepbound  2.5 mg once a week  . Confirmed patient has personal or family history of medullary thyroid  carcinoma (MTC) or Multiple Endocrine Neoplasia syndrome type 2 (MEN 2). Injection technique reviewed at today's visit.  Advised patient on common side effects including nausea, diarrhea, dyspepsia, decreased appetite, and fatigue. Counseled patient on reducing meal size and how to titrate medication to minimize side effects. Counseled patient to call if intolerable side effects or if experiencing dehydration, abdominal pain, or dizziness. Along with pharmacotherapy, the patient will follow dietary modifications and aim for at least 150 minutes of moderate-intensity exercise per week, plus resistance training twice a week (as recommended by the American Heart Association). This resistance training--such as weightlifting, bodyweight exercises, or using resistance bands, adapted to the patient's ability--will help prevent muscle loss.  Phone follow-ups will be conducted every 4 weeks for dose titration until the patient reaches the effective therapeutic dose and target weight.    Robbi Blanch, Pharm.D [facility]. [facility] [address], [place], [place] [address] Phone: [phone]; Fax: [phone]

## 2024-06-14 NOTE — Telephone Encounter (Signed)
     Patient got today and it was free. Prior auth approval on file until 12/02/24

## 2024-06-15 NOTE — Telephone Encounter (Signed)
 Patient seen by Robbi Blanch PharmD 9/18

## 2024-06-15 NOTE — Telephone Encounter (Signed)
 Seen by Robbi Blanch PharmD 9/18

## 2024-06-21 NOTE — Progress Notes (Signed)
 Cardiology Office Note    Date:  06/23/2024  ID:  [name], DOB Jan 02, 1981, MRN [id number] PCP:  de [place], [name], MD  Cardiologist:  Dub Huntsman, DO  Electrophysiologist:  None   Chief Complaint: Follow up for SCAD and HTN   History of Present Illness: Calvin Bailey    Calvin Bailey is a 43 y.o. male with visit-pertinent history of hypertension, hyperlipidemia, OSA, CKD, superobesity, tobacco abuse, chronic HFpEF, chronic chest pain, sleep apnea.  Patient was initially diagnosed with hypertension in his 26s, noted to be difficult to control with previous difficulties with compliance.  In 01/2022 CT angio of the head and neck with no significant carotid or vertebral stenosis.  Patient previously followed with hypertension clinic as well as the Pharm.D. team.  Insert 2024 patient presented to [facility] with complaints of chest pain and hypertensive urgency, was feeling persistent chest pain in the right side of his chest while he was at work.  Chest pain had been related to hypertensive urgency.  Serum creatinine 1.61 with BUN of 29 and eGFR of 54, high sensitive troponin 12, EKG without ischemic changes.  Patient was started on Imdur  and echo was ordered and revealed an LVEF of 60 to 65%, normal LV function, no RWMA, mild concentric LVH, normal RV systolic function, mild aortic valve regurgitation with no significant change from prior study.  It was recommended that he have a coronary CTA as an outpatient.  Coronary CTA was deferred due to elevated serum creatinine.  Patient went Lexiscan  Myoview  that was considered low risk, negative for ischemia or infarction, EF 55 to 65%.  Patient was seen in clinic on 12/20/2023, at that time he is planning on participating in a digital smoking cessation program.  He is evaluated in the ED on 12/19/2023 in setting of shortness of breath, he was noted to have mild bilateral lower extremity edema, BNP was normal.  EKG was without acute changes, troponin  was negative, D-dimer was negative.  He was treated with nebulizer and Solu-Medrol  with improvement in symptoms and was discharged home.  On 04/04/2024 patient was seen in clinic with continued shortness of breath, weight gain, dyspnea on exertion and peripheral edema.  Patient had follow-up with pulmonary and nephrology.  Patient was evaluated at [facility] on 04/27/2024 with complaints of shortness of breath, abdominal pain and chest pain.  He was seen in the ED with no obvious distress and normal breathing pattern workup was unrevealing he was discharged home on furosemide  20 mg daily.  Patient was seen in clinic on 05/09/2024, at that time he continued of shortness of breath and atypical chest discomfort having recently undergone a Lexiscan  Myoview  concerning for ischemia he was scheduled for right and left heart catheterization.  Amlodipine  was increased to 5 mg twice daily for suboptimally controlled blood pressure, started on SGLT2 inhibitor therapy.  R/LHC on 05/18/2024 indicated 70% ostial proximal RCA stenosis that improved to 30% with intracoronary nitroglycerin  suggesting a significant opponent of vasospasm, otherwise no angiographically significant coronary artery disease, noted have upper normal to mildly elevated left heart filling pressures, moderately elevated right heart filling pressures, mild pulmonary hypertension with normal cardiac output, moderate aortic valve/LVOT peak gradient of 24 mmHg.  Medical therapy and risk factor modification was recommended.  Echo on 05/24/2024 indicated LVEF 60 to 65%, LV internal cavity size is mildly dilated, G2 DD, RV systolic function was normal, RV moderately enlarged.  On 06/11/2024 patient presented to [facility] for evaluation  of STEMI.  Patient presented with acute inferior STEMI, noted chest pain began abruptly at 6 PM the night of admission when he was helping his son put on shoulder pads for football practice.  He described a severe ache that started  over the left chest and rated to the jaw.  There was associated shortness of breath.  Patient underwent cardiac cath on 9/15 with diffuse tapering of mid/distal LAD with appearance of intramural hematoma and dissection, recommendations for continued medical therapy, ASA was stopped per MD prior to discharge.  Patient was continued on labetalol  300 mg twice daily, spironolactone  25 mg daily, valsartan  160 mg daily, amlodipine  10 mg daily, BiDil  2 tablets 3 times daily and clonidine  0.1 mg 3 times daily.  Today he presents for follow up. He reports that he is doing ok, he denies any further chest pain. He continues to note ongoing dyspnea on exertion.  Patient reports that his lower extremities are always swollen, he is planning to start on Zepbound .  He reports that his blood pressure has overall been very well-controlled at home with highest blood pressure reading 140/70.  Patient reports that he has stopped smoking, prior to recent event was smoking a pack of cigarettes a day.  He reports compliance with his CPAP device.  Patient denies any increased palpitations, orthopnea or PND.  Patient does note concerns regarding use of clonidine , notes that he has been significantly fatigued after taking the medication and would like to consider alternatives. ROS: .   Today he denies fatigue, palpitations, melena, hematuria, hemoptysis, diaphoresis, weakness, presyncope, syncope, orthopnea, and PND.  All other systems are reviewed and otherwise negative. Studies Reviewed: Calvin Bailey   EKG:  EKG is ordered today, personally reviewed, demonstrating  EKG Interpretation Date/Time:  Friday June 22 2024 14:17:43 EDT Ventricular Rate:  64 PR Interval:  202 QRS Duration:  104 QT Interval:  388 QTC Calculation: 400 R Axis:   23  Text Interpretation: Normal sinus rhythm Possible Inferior infarct (cited on or before 11-Jun-2024) When compared with ECG of 12-Jun-2024 08:48, Non-specific change in ST segment in Lateral leads  T wave inversion now evident in Lateral leads Confirmed by Arrie Zuercher [id number]) on 06/22/2024 5:37:31 PM   CV Studies: Cardiac studies reviewed are outlined and summarized above. Otherwise please see EMR for full report. Cardiac Studies & Procedures   ______________________________________________________________________________________________ CARDIAC CATHETERIZATION  CARDIAC CATHETERIZATION 06/11/2024  Conclusion 1.  Diffuse tapering of the mid and distal LAD with angiographic appearance of diffuse intramural hematoma and apical dissection 2.  Widely patent RCA, left main, proximal LAD, and left circumflex/intermediate branches with no significant stenoses 3.  Normal LVEDP  After review of his films in comparison to his old study, the patient may have both coronary vasospasm and currently with spontaneous coronary artery dissection.  On today's study, the apical LAD is involved and since the LAD wraps the apex and supplies the distal inferior wall I think this is the etiology of his inferior ST elevation. Plan medical therapy.  Findings Coronary Findings Diagnostic  Dominance: Right  Left Main Vessel is large. Vessel is angiographically normal.  Left Anterior Descending Vessel is large. There is mild diffuse disease throughout the vessel. Dist LAD-1 lesion is 75% stenosed. Dist LAD-2 lesion is 95% stenosed.  First Diagonal Branch Vessel is moderate in size.  Ramus Intermedius Vessel is large. The vessel exhibits minimal luminal irregularities.  Left Circumflex Vessel is large. The vessel exhibits minimal luminal irregularities.  First Obtuse Marginal Branch Vessel  is small in size.  Second Obtuse Marginal Branch Vessel is large in size.  Third Obtuse Marginal Branch Vessel is moderate in size.  Fourth Obtuse Marginal Branch Vessel is moderate in size.  Right Coronary Artery Vessel is large. Widely patent vessel with no stenosis.  The ostium is widely patent without any  evidence of a ostial lesion.  Right Posterior Descending Artery Vessel is moderate in size.  Right Posterior Atrioventricular Artery Vessel is small in size.  First Right Posterolateral Branch Vessel is small in size.  Intervention  No interventions have been documented.   CARDIAC CATHETERIZATION  CARDIAC CATHETERIZATION 05/18/2024  Conclusion Conclusions: 70% ostial/proximal RCA stenosis that improves to 30% with intracoronary nitroglycerin  suggesting a significant component of vasospasm.  Otherwise, no angiographically significant coronary artery disease. Upper normal to mildly elevated left heart filling pressures (PCWP 17, LVEDP 18 mmHg). Moderately elevated right heart filling pressures (mean RA 13, RV 43/14 mmHg). Mild pulmonary hypertension (PA 40/19, mean 26 mmHg). Normal Fick cardiac output/index (CO 8.2 L/min, CI 2.8 L/min/m). Moderate aortic valve/LVOT peak to peak gradient (24 mmHg).  Recommendations: Continue medical therapy and risk factor modification to prevent progression of CAD.  Continue isosorbide  mononitrate and amlodipine  for treatment of possible component of coronary vasospasm. Proceed with echocardiogram for further assessment of possible LVOT/aortic valve gradient.  If echo is inconclusive, cardiac MRI may be helpful. Maintain net even to slightly negative fluid balance. Avoid nephrotoxic agents, including NSAIDs.  Lonni Hanson, MD [facility]  Findings Coronary Findings Diagnostic  Dominance: Right  Left Main Vessel is large. Vessel is angiographically normal.  Left Anterior Descending Vessel is large. Vessel is angiographically normal.  First Diagonal Branch Vessel is moderate in size.  Ramus Intermedius Vessel is large. Vessel is angiographically normal.  Left Circumflex Vessel is large. Vessel is angiographically normal.  First Obtuse Marginal Branch Vessel is small in size.  Second Obtuse Marginal Branch Vessel is large  in size.  Third Obtuse Marginal Branch Vessel is moderate in size.  Fourth Obtuse Marginal Branch Vessel is moderate in size.  Right Coronary Artery Vessel is large. Ost RCA to Prox RCA lesion is 70% stenosed. The lesion is spasm. Improves to 30% with intracoronary nitroglycerin  administration.  Right Posterior Descending Artery Vessel is moderate in size.  Right Posterior Atrioventricular Artery Vessel is small in size.  First Right Posterolateral Branch Vessel is small in size.  Intervention  No interventions have been documented.   STRESS TESTS  NM MYOCAR MULTI W/SPECT W 05/04/2024  Narrative Pharmacological myocardial perfusion imaging study with small region of ischemia in the basal to mid inferolateral wall and mid to distal inferior and inferoapical wall Unable to definitively exclude diaphragmatic attenuation artifact as attenuation correction images not available Normal wall motion, EF estimated at 56% No EKG changes concerning for ischemia at peak stress or in recovery. Moderate risk scan   Signed, Velinda Lunger, MD, Ph.D [facility]   ECHOCARDIOGRAM  ECHOCARDIOGRAM COMPLETE 06/12/2024  Narrative ECHOCARDIOGRAM REPORT    Patient Name:   Calvin Bailey Date of Exam: 06/12/2024 Medical Rec #:  [id number]        Height:       73.0 in Accession #:    [id number]       Weight:       379.2 lb Date of Birth:  11-Jun-1981        BSA:          2.824 m Patient Age:    42 years  BP:           168/108 mmHg Patient Gender: M                HR:           63 bpm. Exam Location:  Inpatient  Procedure: 2D Echo (Both Spectral and Color Flow Doppler were utilized during procedure).  Indications:    Acute Ischemic Heart Disease  History:        Patient has prior history of Echocardiogram examinations. Risk Factors:Hypertension.  Sonographer:    Charmaine Gaskins Referring Phys: [phone] MICHAEL COOPER  IMPRESSIONS   1. Left ventricular ejection fraction, by  estimation, is 60 to 65%. The left ventricle has normal function. The left ventricle has no regional wall motion abnormalities. There is mild left ventricular hypertrophy. Left ventricular diastolic parameters were normal. 2. Right ventricular systolic function is normal. The right ventricular size is normal. 3. The mitral valve is normal in structure. No evidence of mitral valve regurgitation. No evidence of mitral stenosis. 4. The aortic valve is normal in structure. Aortic valve regurgitation is not visualized. No aortic stenosis is present. 5. The inferior vena cava is normal in size with greater than 50% respiratory variability, suggesting right atrial pressure of 3 mmHg.  FINDINGS Left Ventricle: Left ventricular ejection fraction, by estimation, is 60 to 65%. The left ventricle has normal function. The left ventricle has no regional wall motion abnormalities. The left ventricular internal cavity size was normal in size. There is mild left ventricular hypertrophy. Left ventricular diastolic parameters were normal.  Right Ventricle: The right ventricular size is normal. No increase in right ventricular wall thickness. Right ventricular systolic function is normal.  Left Atrium: Left atrial size was normal in size.  Right Atrium: Right atrial size was normal in size.  Pericardium: There is no evidence of pericardial effusion.  Mitral Valve: The mitral valve is normal in structure. No evidence of mitral valve regurgitation. No evidence of mitral valve stenosis.  Tricuspid Valve: The tricuspid valve is normal in structure. Tricuspid valve regurgitation is not demonstrated. No evidence of tricuspid stenosis.  Aortic Valve: The aortic valve is normal in structure. Aortic valve regurgitation is not visualized. No aortic stenosis is present.  Pulmonic Valve: The pulmonic valve was normal in structure. Pulmonic valve regurgitation is not visualized. No evidence of pulmonic stenosis.  Aorta:  The aortic root is normal in size and structure.  Venous: The inferior vena cava is normal in size with greater than 50% respiratory variability, suggesting right atrial pressure of 3 mmHg.  IAS/Shunts: No atrial level shunt detected by color flow Doppler.   LEFT VENTRICLE PLAX 2D LVIDd:         5.90 cm      Diastology LVIDs:         3.70 cm      LV e' medial:    7.29 cm/s LV PW:         1.40 cm      LV E/e' medial:  11.1 LV IVS:        1.30 cm      LV e' lateral:   9.03 cm/s LVOT diam:     2.40 cm      LV E/e' lateral: 9.0 LVOT Area:     4.52 cm  LV Volumes (MOD) LV vol d, MOD A2C: 176.0 ml LV vol d, MOD A4C: 191.0 ml LV vol s, MOD A2C: 49.3 ml LV vol s, MOD A4C: 74.4 ml LV  SV MOD A2C:     126.7 ml LV SV MOD A4C:     191.0 ml LV SV MOD BP:      126.8 ml  RIGHT VENTRICLE RV Basal diam:  4.10 cm RV Mid diam:    3.40 cm RV S prime:     15.20 cm/s TAPSE (M-mode): 2.8 cm  LEFT ATRIUM              Index        RIGHT ATRIUM           Index LA diam:        3.60 cm  1.27 cm/m   RA Area:     21.20 cm LA Vol (A2C):   79.7 ml  28.22 ml/m  RA Volume:   58.20 ml  20.61 ml/m LA Vol (A4C):   100.0 ml 35.41 ml/m LA Biplane Vol: 91.9 ml  32.54 ml/m  AORTA Ao Root diam: 3.30 cm Ao Asc diam:  3.70 cm  MITRAL VALVE MV Area (PHT): 3.70 cm    SHUNTS MV Decel Time: 205 msec    Systemic Diam: 2.40 cm MV E velocity: 81.20 cm/s MV A velocity: 98.50 cm/s MV E/A ratio:  0.82  Aditya Sabharwal Electronically signed by Ria Commander Signature Date/Time: 06/12/2024/5:50:54 PM    Final          ______________________________________________________________________________________________       Current Reported Medications:.    Current Meds  Medication Sig   acetaminophen  (TYLENOL ) 500 MG tablet Take 1 tablet (500 mg total) by mouth every 6 (six) hours as needed.   albuterol  (VENTOLIN  HFA) 108 (90 Base) MCG/ACT inhaler Inhale 2 puffs into the lungs every 6 (six) hours as  needed for wheezing or shortness of breath.   amLODipine  (NORVASC ) 10 MG tablet Take 1 tablet (10 mg total) by mouth daily.   cloNIDine  (CATAPRES ) 0.1 MG tablet Take 1 tablet (0.1 mg total) by mouth 3 (three) times daily.   empagliflozin  (JARDIANCE ) 10 MG TABS tablet Take 1 tablet (10 mg total) by mouth daily before breakfast.   furosemide  (LASIX ) 20 MG tablet Take 1 tablet (20 mg total) by mouth daily as needed for fluid or edema.   isosorbide -hydrALAZINE  (BIDIL ) 20-37.5 MG tablet Take 2 tablets by mouth 3 (three) times daily.   labetalol  (NORMODYNE ) 300 MG tablet TAKE 1 TABLET(300 MG) BY MOUTH TWICE DAILY   nicotine  (NICODERM CQ  - DOSED IN MG/24 HOURS) 14 mg/24hr patch Place 1 patch (14 mg total) onto the skin daily.   nitroGLYCERIN  (NITROSTAT ) 0.4 MG SL tablet Place 1 tablet (0.4 mg total) under the tongue every 5 (five) minutes as needed for chest pain.   spironolactone  (ALDACTONE ) 25 MG tablet TAKE 1 TABLET(25 MG) BY MOUTH DAILY   tirzepatide  (ZEPBOUND ) 2.5 MG/0.5ML Pen Inject 2.5 mg into the skin once a week.   valsartan  (DIOVAN ) 160 MG tablet TAKE 1 TABLET(160 MG) BY MOUTH TWICE DAILY   Physical Exam:    VS:  BP (!) 142/86   Pulse 64   Ht 6' 1 (1.854 m)   Wt (!) 389 lb (176.4 kg)   SpO2 95%   BMI 51.32 kg/m    Wt Readings from Last 3 Encounters:  06/22/24 (!) 389 lb (176.4 kg)  06/14/24 (!) 389 lb (176.4 kg)  06/13/24 (!) 386 lb 3.9 oz (175.2 kg)    GEN: Well nourished, well developed in no acute distress NECK: No JVD; No carotid bruits CARDIAC: RRR, no murmurs, rubs, gallops  RESPIRATORY:  Clear to auscultation without rales, wheezing or rhonchi  ABDOMEN: Soft, non-tender, non-distended EXTREMITIES:  No edema; No acute deformity     Asessement and Plan:.    STEMI/SCAD: Patient presented as a inferior STEMI on 06/11/2024, cardiac catheterization with diffuse tapering of mid/distal LAD with appearance of intramural hematoma and dissection.  Recommendation for continued  medical therapy, ASA was stopped. Today he reports that he has been doing okay, denies any further chest discomfort, continues to note ongoing dyspnea on exertion, denies any significant changes.  Cath site is clean and intact without evidence of hematoma. Discussed with Dr. Wendel, will hold off on starting cardiac rehab at this time until blood pressure is better controlled.  Patient notes he is planning to have ESI in a few weeks, per Dr. Wendel patient can proceed. Patient agreeable to 2 to 3-week follow-up, cardiac rehab to be reevaluated at that time.  Reviewed ED precautions.  Continue amlodipine  10 mg daily, Jardiance  10 mg daily, Lasix  20 mg daily as needed, BiDil , labetalol  300 mg twice daily, spironolactone  25 mg daily and valsartan  160 mg daily.  Hypertension: Blood pressure today 142/86.  Patient reports that at home his blood pressure has been controlled with highest reading being 140/70.  Patient notes concerns with clonidine , notes that this makes him significantly fatigued and he does not feel that this is a good long-term option.  Discussed with Pharm.D. who recommended potentially conversion to a clonidine  patch at 0.1 mg, also discussed with patient increasing his valsartan  to 320 mg daily if renal function is stable on lab work today.  Patient hesitant to make any further changes, agreed to see results of lab work prior to changes today.  Discussed with patient the need for good blood pressure control given recent SCAD, patient verbalized his understanding.  Continue amlodipine  10 mg daily, clonidine , Jardiance , Lasix  20 mg daily as needed, BiDil  20-37.5 mg, 2 tablets 3 times a day, labetalol  300 mg twice daily, spironolactone  25 mg daily and valsartan  160 mg daily. Reviewed ED precautions. Check BMET today.   Chronic HFpEF: Patient with chronic shortness of breath and peripheral edema.  Patient continues to note some dyspnea on exertion and lower extremity edema, difficult to evaluate  fluid status due to body habitus.  Echocardiogram on 06/12/2024 indicated LVEF 60 to 65%, no RWMA, mild LVH, diastolic parameters were normal, RV systolic function and size was normal, no evidence of mitral valve regurgitation or stenosis, aortic valve regurgitation was not visualized, no stenosis was present.  Continue Jardiance  10 mg daily, Lasix  20 mg daily as needed for increased redness of breath or lower extremity edema, BiDil , labetalol  300 mg twice daily, spironolactone  25 mg daily and valsartan  160 mg daily. Check BMET today.   CKD stage III: Last creatinine 1.6.  Per notes patient's baseline serum creatinine 1.6/1.7.  Patient followed by nephrology. Check Bmet today.  Tobacco abuse: Patient reports that he has stopped smoking.  Congratulated.   OSA on CPAP: Reports CPAP adherence.     Cardiac Rehabilitation Eligibility Assessment  The patient is NOT ready to start cardiac rehabilitation due to: Other (Needs better blood pressure control)   Disposition: F/u with Tylene Lunch, NP in 2-3 weeks per patient request.   Signed, Lorren Rossetti D Noga Fogg, NP

## 2024-06-22 ENCOUNTER — Encounter: Payer: Self-pay | Admitting: Cardiology

## 2024-06-22 ENCOUNTER — Ambulatory Visit: Admitting: Cardiology

## 2024-06-22 ENCOUNTER — Ambulatory Visit: Attending: Cardiology | Admitting: Cardiology

## 2024-06-22 VITALS — BP 142/86 | HR 64 | Ht 73.0 in | Wt 389.0 lb

## 2024-06-22 DIAGNOSIS — I5032 Chronic diastolic (congestive) heart failure: Secondary | ICD-10-CM | POA: Diagnosis not present

## 2024-06-22 DIAGNOSIS — G4733 Obstructive sleep apnea (adult) (pediatric): Secondary | ICD-10-CM

## 2024-06-22 DIAGNOSIS — I2542 Coronary artery dissection: Secondary | ICD-10-CM | POA: Diagnosis not present

## 2024-06-22 DIAGNOSIS — F17200 Nicotine dependence, unspecified, uncomplicated: Secondary | ICD-10-CM

## 2024-06-22 DIAGNOSIS — I10 Essential (primary) hypertension: Secondary | ICD-10-CM

## 2024-06-22 DIAGNOSIS — N1832 Chronic kidney disease, stage 3b: Secondary | ICD-10-CM | POA: Diagnosis not present

## 2024-06-22 NOTE — Patient Instructions (Addendum)
 Medication Instructions:  Your physician recommends that you continue on your current medications as directed. Please refer to the Current Medication list given to you today. *If you need a refill on your cardiac medications before your next appointment, please call your pharmacy*  Lab Work: TODAY-BMET If you have labs (blood work) drawn today and your tests are completely normal, you will receive your results only by: MyChart Message (if you have MyChart) OR A paper copy in the mail If you have any lab test that is abnormal or we need to change your treatment, we will call you to review the results.  Testing/Procedures: NONE ORDERED  Follow-Up: At [facility], you and your health needs are our priority.  As part of our continuing mission to provide you with exceptional heart care, our providers are all part of one team.  This team includes your primary Cardiologist (physician) and Advanced Practice Providers or APPs (Physician Assistants and Nurse Practitioners) who all work together to provide you with the care you need, when you need it.  Your next appointment:   2-3 week(s)  Provider:   Tylene Lunch, NP    We recommend signing up for the patient portal called MyChart.  Sign up information is provided on this After Visit Summary.  MyChart is used to connect with patients for Virtual Visits (Telemedicine).  Patients are able to view lab/test results, encounter notes, upcoming appointments, etc.  Non-urgent messages can be sent to your provider as well.   To learn more about what you can do with MyChart, go to [email or web address].   Other Instructions

## 2024-06-23 ENCOUNTER — Encounter: Payer: Self-pay | Admitting: Cardiology

## 2024-06-25 ENCOUNTER — Ambulatory Visit: Admitting: Cardiology

## 2024-06-25 ENCOUNTER — Other Ambulatory Visit: Payer: Self-pay | Admitting: Cardiovascular Disease

## 2024-06-25 ENCOUNTER — Other Ambulatory Visit (HOSPITAL_BASED_OUTPATIENT_CLINIC_OR_DEPARTMENT_OTHER): Payer: Self-pay | Admitting: Cardiology

## 2024-06-29 ENCOUNTER — Telehealth: Payer: Self-pay

## 2024-06-29 ENCOUNTER — Other Ambulatory Visit: Payer: Self-pay | Admitting: Cardiology

## 2024-06-29 NOTE — Telephone Encounter (Signed)
   Pre-operative Risk Assessment    Patient Name: Calvin Bailey  DOB: December 19, 1980 MRN: [id number]   Date of last office visit: 06/22/24 ROSABEL MOSE, NP Date of next office visit: 07/12/24 SHERI HAMMOCK, NP   Request for Surgical Clearance    Procedure:  RIGHT C6-C7 ESI  Date of Surgery:  Clearance TBD                                Surgeon:  DR DEATRICE MANUS Surgeon's Group or Practice Name:  [facility] Phone number:  [phone] Fax number:  [phone]   Type of Clearance Requested:   - Medical    Type of Anesthesia:  Not Indicated   Additional requests/questions:    Signed, Lucie DELENA Ku   06/29/2024, 11:01 AM

## 2024-06-29 NOTE — Telephone Encounter (Signed)
   Patient Name: Calvin Bailey  DOB: 1981-09-21 MRN: [id number]  Primary Cardiologist: Kardie Tobb, DO  Chart reviewed as part of pre-operative protocol coverage. Given past medical history and time since last visit, based on ACC/AHA guidelines, [name] is at acceptable risk for the planned procedure without further cardiovascular testing.   Per Katlyn West, NP on 06/22/2024:  Patient notes he is planning to have ESI in a few weeks, per Dr. Wendel patient can proceed.   The patient was advised that if he develops new symptoms prior to surgery to contact our office to arrange for a follow-up visit, and he verbalized understanding.  I will route this recommendation to the requesting party via Epic fax function and remove from pre-op pool.  Please call with questions.  Lamarr Satterfield, NP 06/29/2024, 11:08 AM

## 2024-07-03 ENCOUNTER — Other Ambulatory Visit (HOSPITAL_COMMUNITY): Payer: Self-pay

## 2024-07-03 ENCOUNTER — Other Ambulatory Visit: Payer: Self-pay | Admitting: Cardiology

## 2024-07-03 ENCOUNTER — Other Ambulatory Visit (HOSPITAL_BASED_OUTPATIENT_CLINIC_OR_DEPARTMENT_OTHER): Payer: Self-pay

## 2024-07-04 ENCOUNTER — Other Ambulatory Visit (HOSPITAL_BASED_OUTPATIENT_CLINIC_OR_DEPARTMENT_OTHER): Payer: Self-pay

## 2024-07-04 MED ORDER — ZEPBOUND 5 MG/0.5ML ~~LOC~~ SOAJ
5.0000 mg | SUBCUTANEOUS | 0 refills | Status: DC
  Filled 2024-07-04 – 2024-07-05 (×2): qty 2, 28d supply, fill #0

## 2024-07-04 NOTE — Telephone Encounter (Addendum)
 Patient called and LVM, tolerates 2.5 mg Zepbound  dose well without side effects. Will send 5 mg dose to pharmacy.

## 2024-07-05 ENCOUNTER — Other Ambulatory Visit (HOSPITAL_BASED_OUTPATIENT_CLINIC_OR_DEPARTMENT_OTHER): Payer: Self-pay

## 2024-07-05 ENCOUNTER — Other Ambulatory Visit: Payer: Self-pay

## 2024-07-06 ENCOUNTER — Other Ambulatory Visit (HOSPITAL_BASED_OUTPATIENT_CLINIC_OR_DEPARTMENT_OTHER): Payer: Self-pay

## 2024-07-10 LAB — BASIC METABOLIC PANEL WITH GFR
BUN/Creatinine Ratio: 12 (ref 9–20)
BUN: 20 mg/dL (ref 6–24)
CO2: 19 mmol/L — ABNORMAL LOW (ref 20–29)
Calcium: 9.1 mg/dL (ref 8.7–10.2)
Chloride: 105 mmol/L (ref 96–106)
Creatinine, Ser: 1.68 mg/dL — ABNORMAL HIGH (ref 0.76–1.27)
Glucose: 83 mg/dL (ref 70–99)
Potassium: 4.6 mmol/L (ref 3.5–5.2)
Sodium: 137 mmol/L (ref 134–144)
eGFR: 51 mL/min/1.73 — ABNORMAL LOW (ref >59)

## 2024-07-11 ENCOUNTER — Ambulatory Visit: Payer: Self-pay | Admitting: Cardiology

## 2024-07-12 ENCOUNTER — Ambulatory Visit: Attending: Cardiology | Admitting: Cardiology

## 2024-07-12 ENCOUNTER — Encounter: Payer: Self-pay | Admitting: Cardiology

## 2024-07-12 ENCOUNTER — Ambulatory Visit

## 2024-07-12 VITALS — BP 117/69 | HR 73 | Ht 73.0 in | Wt 378.8 lb

## 2024-07-12 DIAGNOSIS — I2510 Atherosclerotic heart disease of native coronary artery without angina pectoris: Secondary | ICD-10-CM

## 2024-07-12 DIAGNOSIS — G4733 Obstructive sleep apnea (adult) (pediatric): Secondary | ICD-10-CM

## 2024-07-12 DIAGNOSIS — R002 Palpitations: Secondary | ICD-10-CM | POA: Diagnosis not present

## 2024-07-12 DIAGNOSIS — I2542 Coronary artery dissection: Secondary | ICD-10-CM

## 2024-07-12 DIAGNOSIS — N1832 Chronic kidney disease, stage 3b: Secondary | ICD-10-CM

## 2024-07-12 DIAGNOSIS — F17200 Nicotine dependence, unspecified, uncomplicated: Secondary | ICD-10-CM

## 2024-07-12 DIAGNOSIS — I5032 Chronic diastolic (congestive) heart failure: Secondary | ICD-10-CM | POA: Diagnosis not present

## 2024-07-12 DIAGNOSIS — I10 Essential (primary) hypertension: Secondary | ICD-10-CM

## 2024-07-12 NOTE — Patient Instructions (Signed)
 Medication Instructions:  Your physician recommends that you continue on your current medications as directed. Please refer to the Current Medication list given to you today.   *If you need a refill on your cardiac medications before your next appointment, please call your pharmacy*  Lab Work: None ordered at this time   Testing/Procedures: ZIO XT- Long Term Monitor Instructions  Your physician has requested you wear a ZIO patch monitor for 14 days.  This is a single patch monitor. [employer] supplies one patch monitor per enrollment. Additional stickers are not available. Please do not apply patch if you will be having a Nuclear Stress Test, Echocardiogram, Cardiac CT, MRI, or Chest Xray during the period you would be wearing the monitor. The patch cannot be worn during these tests. You cannot remove and re-apply the ZIO XT patch monitor.  Your ZIO patch monitor will be mailed 3 day USPS to your address on file. It may take 3-5 days to receive your monitor after you have been enrolled. Once you have received your monitor, please review the enclosed instructions. Your monitor has already been registered assigning a specific monitor serial number to you.  Billing and Patient Assistance Program Information  We have supplied [employer] with any of your insurance information on file for billing purposes.  [employer] offers a sliding scale Patient Assistance Program for patients that do not have insurance, or whose insurance does not completely cover the cost of the ZIO monitor.  You must apply for the Patient Assistance Program to qualify for this discounted rate.  To apply, please call [employer] at [phone], select option 4, select option 2, ask to apply for Patient Assistance Program. Meredeth will ask your household income, and how many people are in your household. They will quote your out-of-pocket cost based on that information. [employer] will also be able to set up a 15-month, interest-free payment plan if  needed.  Applying the monitor   Shave hair from upper left chest.  Hold abrader disc by orange tab. Rub abrader in 40 strokes over the upper left chest as indicated in your monitor instructions.  Clean area with 4 enclosed alcohol pads. Let dry.  Apply patch as indicated in monitor instructions. Patch will be placed under collarbone on left side of chest with arrow pointing upward.  Rub patch adhesive wings for 2 minutes. Remove white label marked 1. Remove the white label marked 2. Rub patch adhesive wings for 2 additional minutes.  While looking in a mirror, press and release button in center of patch. A small green light will flash 3-4 times. This will be your only indicator that the monitor has been turned on.  Do not shower for the first 24 hours. You may shower after the first 24 hours.  Press the button if you feel a symptom. You will hear a small click. Record Date, Time and Symptom in the Patient Logbook.  When you are ready to remove the patch, follow instructions on the last 2 pages of Patient Logbook.  Stick patch monitor into the tabs at the bottom of the return box.  Place Patient Logbook in the blue and white box. Use locking tab on box and tape box closed securely. The blue and white box has prepaid postage on it. Please place it in the mailbox as soon as possible. Your physician should have your test results approximately 7-14 days after the monitor has been mailed back to [facility].  Call [facility] Customer Care at [phone] if you have  questions regarding your ZIO XT patch monitor.  Call them immediately if you see an orange light blinking on your monitor.  If your monitor falls off in less than 4 days, contact our Monitor department at [phone].  If your monitor becomes loose or falls off after 4 days call [employer] at [phone] for suggestions on securing your monitor.   Follow-Up: At [facility], you and your health needs are our priority.   As part of our continuing mission to provide you with exceptional heart care, our providers are all part of one team.  This team includes your primary Cardiologist (physician) and Advanced Practice Providers or APPs (Physician Assistants and Nurse Practitioners) who all work together to provide you with the care you need, when you need it.  Your next appointment:   6 week(s)  Provider:   You may see Kardie Tobb, DO or Tylene Lunch, NP

## 2024-07-12 NOTE — Progress Notes (Signed)
**Note Calvin-Identified via Obfuscation**  Cardiology Office Note   Date:  07/16/2024  ID:  EDWEN Bailey, DOB 1981/06/19, MRN [id number] PCP: Calvin Peru, [name], MD  [facility] Providers Cardiologist:  Dub Huntsman, DO     History of Present Illness Calvin Bailey is a 43 y.o. male with a past medical history of hypertension, hyperlipidemia, OSA, CKD, superobesity, tobacco abuse, chronic HFpEF, chronic chest pain, sleep apnea, coronary artery disease (acute inferior STEMI/ischemic 06/11/2024), who is here today for follow-up.   Diagnosed with hypertension in his 24s.  Has been difficult to control previous difficulties with compliance.  01/2022 CT angio of the head and neck with no significant carotid or vertebral stenosis.  Previously been followed with hypertensive clinic as well as seen the Pharm.D. team.  Presented to [facility] in February 2024 with complaints of chest pain and hypertensive urgency.  Was feeling persistent chest pain in the right side of his chest while he was at work.  Chest pain had been related to hypertensive urgency.  Had chest pain 11/13/2022 with no PE on CT of the chest.  Blood pressure was 172/29.  Serum creatinine 1.61 with BUN of 29 and GFR 54, high-sensitivity troponin 12, EKG without ischemic changes.  Started on Imdur  and echocardiogram was ordered.  And revealed an LVEF of 60-65%, normal LV function, no RWMA, mild concentric LVH, normal RV systolic function, mild aortic valve regurgitation, no significant change from prior study.  It was recommended he have a coronary CTA as an outpatient.  Evaluated in urgent care 01/25/23 with complaints of a headache for 2 days.  Concerns if it was in the right temple area was swollen.  He had surgery upcoming soon for renal mass and is worried about a blood clot or cancer that had spread.  Blood pressure was much improved to 147/83.  He was treated with Tylenol  for his headache.  He was subsequently discharged.  He was scheduled for Lexiscan  Myoview   to complete his ischemic workup from previous hospitalization.  Coronary CTA was deferred due to elevated serum creatinine.  Lexiscan  Myoview  was considered low risk, negative for ischemia or infarction, EF 55 to 65%.  He was last seen in clinic 12/20/2023 at that time he was planning on participating in a digital smoking cessation program  He was evaluated in the ED on 12/19/2023 in the setting of shortness of breath.  He was noted to have mild bilateral lower extremity edema.  BNP was normal.  EKG was without acute changes, troponin was negative, D-dimer was negative.  He was treated with nebulizer and Solu-Medrol  with improvement in his symptoms and was discharged home in stable condition.  There were no medication changes that were made or further testing that was ordered at that time.  Multiple emergency department visits for varying complaints of chest and abdominal pain. Work-ups were unrevealing and he was discharged home.  He was seen in clinic 04/04/2024 with continued shortness of breath, weight gain, dyspnea exertion, peripheral edema.  He had follow-up with pulmonary and nephrology.  He had recent been evaluated emergency department the day before with complaints of chest pain with negative troponins.  He was scheduled for today Lexiscan  to rule out ischemic causes.  He was evaluated in the emergency department at [facility] on 04/28/2019 with complaints of shortness of breath abdominal pain chest pain.  He complained of shortness of breath and chest tightness for approximately month.  He was in the ED with no obvious distress and normal breathing  pattern.  Workup was unrevealing and he was discharged home with furosemide  20 mg daily.  He was seen in clinic 05/09/2024 continued to have shortness of breath and atypical chest discomfort.  Recently undergone Lexiscan  Myoview  concerning for ischemia was scheduled for right and left heart catheterization.  Amlodipine  was increased to 5 mg twice daily for  suboptimally controlled blood pressure.  He was started on SGLT2 inhibitor therapy was scheduled for an updated echocardiogram.  He was last seen in clinic 06/01/2024 send he was doing well from a cardiac perspective.  Denied any recurrent chest pain.  Continue to complain of shortness of breath.  Stated he followed back with pulmonary and was advised it was likely of an issue with his lungs.  He continued to stay compliant with his medications.  He had several questions today related to weight loss and was curious if he qualified for Ozempic  or Zepbound  therapy.   He was hospitalized at [facility] from 9/15 - 06/13/2024 for ST elevation myocardial infarction involving the left anterior descending.  He was taken emergently to the Cath Lab on 05/11/2554 and was found to have diffuse tapering of the mid and distal LAD with angiographic appearance of diffuse intramural hematoma and apical dissection.  Widely patent RCA, left main, proximal LAD, and left circumflex/intermittent branches with no significant stenosis, normal LVEDP.  After review of his films in comparison to his old study the patient may have both coronary vasospasm and currently with spontaneous coronary artery dissection.  Today stenting the apical LAD was involved and since the LAD wraps the apex and supplies the distal inferior wall if the etiology of his inferior ST elevation was medical therapy.  Echocardiogram revealed an LVEF of 60 to 65% left ventricle was normal function.  He was aspirin  was stopped due to hematoma and dissection.  He was continued on labetalol  300 mg twice daily, spironolactone  25 mg daily, valsartan  60 mg daily, amlodipine  10 mg daily, BiDil  2 tablets 3 times daily, clonidine  0.1 mg 3 times daily started on nicotine  patches and was considered stable for discharge.  He was seen in clinic 06/23/2024 by MARLA Mose NP.  At that time he stated he was doing okay he denied any further chest pain.  He continued to note ongoing  dyspnea on exertion.  He reported his lower extremities are always swollen.  He had planned on starting Zepbound  he reported blood pressure overall been well-controlled at home besides blood pressure reading 140/70.  He reported stop smoking prior to his recent event he was smoking a pack of cigarettes per day.  Reported compliance with CPAP device.  EKG showed sinus rhythm with possible inferior infarct.  His cath site was clean and intact with no evidence of hematoma there we have recommended holding off on cardiac rehab until his blood pressure was better controlled as he was planning on having ESI in a few weeks.  Dr. Tekturna was a DOD and was agreeable for 2 to 3-week follow-up and cardiac rehab could be reevaluated at that time.  Reviewed ED precautions as well.  Valsartan  was increased to 320 mg daily if renal function remained stable.  He was sent for labs.  He returns to clinic today stating overall he was doing well from a cardiac perspective.  He denies any chest pain but continues to have some exertional shortness of breath.  States that he has been compliant with his current medication regimen but he previously had not tolerated the increase in  the clonidine  to 0.2 mg 3 times daily as previously he had in the hospital.  States he has been compliant with CPAP.  Chest pain had essentially resolved.  He had stopped smoking.  Was to have injections in his neck for his discomfort.  Prior injections did not work so he was unsure whether he was going to continue with those injections or not.  He stated that he had not started cardiac rehab and had to wait until this appointment prior to.  He had not had any recurrent hospitalizations or visits to the emergency department.  ROS: 10 point review of system have been reviewed and considered negative except ones been listed in the HPI  Studies Reviewed EKG Interpretation Date/Time:  Thursday July 12 2024 11:33:49 EDT Ventricular Rate:  73 PR  Interval:  190 QRS Duration:  102 QT Interval:  378 QTC Calculation: 416 R Axis:   3  Text Interpretation: Normal sinus rhythm Inferior infarct (cited on or before 12-Jun-2024) When compared with ECG of 22-Jun-2024 14:17, No acute changes Confirmed by Gerard Frederick ([id number]) on 07/12/2024 11:35:47 AM    2D echo 05/24/2024 1. Left ventricular ejection fraction, by estimation, is 60 to 65%. The  left ventricle has normal function. Left ventricular endocardial border  not optimally defined to evaluate regional wall motion. The left  ventricular internal cavity size was mildly  dilated. Left ventricular diastolic parameters are consistent with Grade  II diastolic dysfunction (pseudonormalization). The average left  ventricular global longitudinal strain is -18.3 %. The global longitudinal  strain is normal.   2. Right ventricular systolic function is normal. The right ventricular  size is moderately enlarged. Tricuspid regurgitation signal is inadequate  for assessing PA pressure.   3. Left atrial size was moderately dilated.   4. The mitral valve is normal in structure. No evidence of mitral valve  regurgitation. No evidence of mitral stenosis.   5. The aortic valve is tricuspid. Aortic valve regurgitation is trivial.  Aortic valve sclerosis/calcification is present, without any evidence of  aortic stenosis. Aortic valve area, by VTI measures 4.06 cm. Aortic valve  mean gradient measures 9.8 mmHg.   Aortic valve Vmax measures 2.12 m/s.   6. The inferior vena cava is normal in size with greater than 50%  respiratory variability, suggesting right atrial pressure of 3 mmHg.    R/LHC 05/18/2024 Conclusions: 70% ostial/proximal RCA stenosis that improves to 30% with intracoronary nitroglycerin  suggesting a significant component of vasospasm.  Otherwise, no angiographically significant coronary artery disease. Upper normal to mildly elevated left heart filling pressures (PCWP 17, LVEDP 18  mmHg). Moderately elevated right heart filling pressures (mean RA 13, RV 43/14 mmHg). Mild pulmonary hypertension (PA 40/19, mean 26 mmHg). Normal Fick cardiac output/index (CO 8.2 L/min, CI 2.8 L/min/m). Moderate aortic valve/LVOT peak to peak gradient (24 mmHg).   Recommendations: Continue medical therapy and risk factor modification to prevent progression of CAD.  Continue isosorbide  mononitrate and amlodipine  for treatment of possible component of coronary vasospasm. Proceed with echocardiogram for further assessment of possible LVOT/aortic valve gradient.  If echo is inconclusive, cardiac MRI may be helpful. Maintain net even to slightly negative fluid balance. Avoid nephrotoxic agents, including NSAIDs.   Lexiscan  MPI 05/04/2024 Pharmacological myocardial perfusion imaging study with small region of ischemia in the basal to mid inferolateral wall and mid to distal inferior and inferoapical wall Unable to definitively exclude diaphragmatic attenuation artifact as attenuation correction images not available Normal wall motion, EF estimated at 56% No  EKG changes concerning for ischemia at peak stress or in recovery. Moderate risk scan   Lexiscan  MPI 01/17/2023   The study is normal. The study is low risk.   No ST deviation was noted.   LV perfusion is normal. There is no evidence of ischemia. There is no evidence of infarction.   Left ventricular function is normal. The left ventricular ejection fraction is normal (55-65%). End diastolic cavity size is normal.   This was supposed to be a 2-day study.  However, stress images are normal and does not need for rest imaging   TTE 11/20/22 1. Left ventricular ejection fraction, by estimation, is 60 to 65%. The  left ventricle has normal function. The left ventricle has no regional  wall motion abnormalities. There is mild concentric left ventricular  hypertrophy. Left ventricular diastolic  parameters were normal.   2. Right ventricular  systolic function is normal. The right ventricular  size is normal. There is normal pulmonary artery systolic pressure.   3. The mitral valve is normal in structure. Trivial mitral valve  regurgitation. No evidence of mitral stenosis.   4. The aortic valve is tricuspid. Aortic valve regurgitation is mild. No  aortic stenosis is present.   5. The inferior vena cava is normal in size with greater than 50%  respiratory variability, suggesting right atrial pressure of 3 mmHg.   Risk Assessment/Calculations     Physical Exam VS:  BP 117/69 (BP Location: Left Arm, Patient Position: Sitting, Cuff Size: Large)   Pulse 73 Comment: 80 oximeter  Ht 6' 1 (1.854 m)   Wt (!) 378 lb 12.8 oz (171.8 kg)   SpO2 96%   BMI 49.98 kg/m        Wt Readings from Last 3 Encounters:  07/12/24 (!) 378 lb 12.8 oz (171.8 kg)  06/22/24 (!) 389 lb (176.4 kg)  06/14/24 (!) 389 lb (176.4 kg)    GEN: Well nourished, well developed in no acute distress NECK: No JVD; No carotid bruits CARDIAC: RRR, no murmurs, rubs, gallops RESPIRATORY:  Clear to auscultation without rales, wheezing or rhonchi  ABDOMEN: Soft, non-tender, obese, non-distended EXTREMITIES:  No edema; No deformity   ASSESSMENT AND PLAN Coronary artery disease with recent STEMI/SCAD 08/11/2024, cardiac catheterization with diffuse tapering of mid/distal LAD with appearance of intramural hematoma and dissection.  Recommendation for continued medical therapy, aspirin  was stopped.  Today he denies any chest discomfort.  EKG reveals sinus rhythm with rate of 73 with an old inferior infarct.  He continues on his current medication regimen.  With his blood pressure being better controlled today and with him being undecided about injection he has been cleared for cardiac rehab to increase his stamina and get him more active.  Hypertension with a blood pressure today 117/69.  Blood pressure is better controlled.  He has been continued on amlodipine  10 mg daily,  clonidine  0.1 mg 3 times daily, Jardiance  10 mg daily, furosemide  20 mg as needed for edema, BiDil  20/37.5 mg 2 tablets 3 times daily, labetalol  300 mg twice daily, spironolactone  25 mg daily, and valsartan  160 mg daily.  He has been encouraged to continue to monitor blood pressures 1 to 2 hours postmedication administration at home as well.  Chronic HFpEF with his chronic shortness of breath and peripheral edema he continues to note some dyspnea on exertion.  It is difficult to evaluate his fluid status due to his body habitus.  Echocardiogram completed on 06/12/2024 indicated LVEF of 60 to 65%, no  RWMA, mild LVH, RV systolic function and size were normal no evidence of mitral valve regurgitation or stenosis and aortic valve regurgitation was not visualized.  He has continued on GDMT of Jardiance  10 mg daily, furosemide  20 mg as needed and by ADL as well as spironolactone .  Discussed cardiac MRI today to further assessment LVOT and aortic valve gradient.  Patient is agreeable to scheduling cardiac MRI on return.  Palpitations that been ongoing since his discharge from the hospital.  EKG is unrevealing today's reveal sinus rhythm with rate of 73 with no ectopy.  He has been placed on a ZIO XT monitor for 14 days to rule out any ectopy or pauses.  CKD stage III was last seen creatinine 1.6 patient continues to be followed by nephrology.  Tobacco abuse where he states he is a former smoker, congratulated on his efforts to stop smoking.  OSA on CPAP where he reports adherence.  Morbid obesity with a BMI of 49.98.  Continue on GLP-1 weight loss will continue to be beneficial to his overall prognosis.   Cardiac Rehabilitation Eligibility Assessment  The patient is NOT ready to start cardiac rehabilitation due to: Other (Needs better blood pressure control)       Dispo: Patient to return to clinic to see MD/APP in 6 weeks or sooner if needed for reevaluation  Signed, Cambree Hendrix, NP

## 2024-07-13 ENCOUNTER — Telehealth: Payer: Self-pay | Admitting: Cardiology

## 2024-07-13 ENCOUNTER — Other Ambulatory Visit: Payer: Self-pay | Admitting: Cardiology

## 2024-07-13 ENCOUNTER — Other Ambulatory Visit (HOSPITAL_BASED_OUTPATIENT_CLINIC_OR_DEPARTMENT_OTHER): Payer: Self-pay

## 2024-07-13 ENCOUNTER — Other Ambulatory Visit: Payer: Self-pay

## 2024-07-13 MED ORDER — LABETALOL HCL 300 MG PO TABS
300.0000 mg | ORAL_TABLET | Freq: Two times a day (BID) | ORAL | 1 refills | Status: DC
  Filled 2024-07-13: qty 60, 30d supply, fill #0

## 2024-07-13 NOTE — Telephone Encounter (Signed)
*  STAT* If patient is at the pharmacy, call can be transferred to refill team.   1. Which medications need to be refilled? (please list name of each medication and dose if known)   labetalol  (NORMODYNE ) 300 MG tablet    2. Which pharmacy/location (including street and city if local pharmacy) is medication to be sent to?  [facility]    3. Do they need a 30 day or 90 day supply? 90

## 2024-07-13 NOTE — Telephone Encounter (Signed)
 Refill sent.

## 2024-07-14 ENCOUNTER — Other Ambulatory Visit (HOSPITAL_BASED_OUTPATIENT_CLINIC_OR_DEPARTMENT_OTHER): Payer: Self-pay

## 2024-07-16 ENCOUNTER — Encounter: Payer: Self-pay | Admitting: Cardiology

## 2024-07-20 ENCOUNTER — Telehealth (HOSPITAL_COMMUNITY): Payer: Self-pay | Admitting: *Deleted

## 2024-07-20 ENCOUNTER — Other Ambulatory Visit (HOSPITAL_BASED_OUTPATIENT_CLINIC_OR_DEPARTMENT_OTHER): Payer: Self-pay

## 2024-07-20 ENCOUNTER — Ambulatory Visit: Admitting: Nurse Practitioner

## 2024-07-20 NOTE — Telephone Encounter (Signed)
 Chart reviewed. Patient appropriate for cardiac rehab scheduling.[name] RN BSN

## 2024-07-23 ENCOUNTER — Telehealth (HOSPITAL_COMMUNITY): Payer: Self-pay

## 2024-07-23 NOTE — Telephone Encounter (Signed)
 Called patient to see if he was interested in participating in the Cardiac Rehab Program. Patient will come in for orientation on 11/18 and will attend the 8:15 exercise class.  Sent MyChart message.

## 2024-07-25 ENCOUNTER — Other Ambulatory Visit: Payer: Self-pay

## 2024-07-25 ENCOUNTER — Emergency Department (HOSPITAL_BASED_OUTPATIENT_CLINIC_OR_DEPARTMENT_OTHER)
Admission: EM | Admit: 2024-07-25 | Discharge: 2024-07-25 | Disposition: A | Discharge status: Home / Self Care | Attending: Emergency Medicine | Admitting: Emergency Medicine

## 2024-07-25 ENCOUNTER — Encounter (HOSPITAL_BASED_OUTPATIENT_CLINIC_OR_DEPARTMENT_OTHER): Payer: Self-pay | Admitting: Emergency Medicine

## 2024-07-25 ENCOUNTER — Emergency Department (HOSPITAL_BASED_OUTPATIENT_CLINIC_OR_DEPARTMENT_OTHER)

## 2024-07-25 DIAGNOSIS — Z79899 Other long term (current) drug therapy: Secondary | ICD-10-CM | POA: Insufficient documentation

## 2024-07-25 DIAGNOSIS — I509 Heart failure, unspecified: Secondary | ICD-10-CM | POA: Diagnosis not present

## 2024-07-25 DIAGNOSIS — N189 Chronic kidney disease, unspecified: Secondary | ICD-10-CM | POA: Diagnosis not present

## 2024-07-25 DIAGNOSIS — R079 Chest pain, unspecified: Secondary | ICD-10-CM | POA: Diagnosis present

## 2024-07-25 DIAGNOSIS — R0789 Other chest pain: Secondary | ICD-10-CM | POA: Diagnosis not present

## 2024-07-25 DIAGNOSIS — I130 Hypertensive heart and chronic kidney disease with heart failure and stage 1 through stage 4 chronic kidney disease, or unspecified chronic kidney disease: Secondary | ICD-10-CM | POA: Diagnosis not present

## 2024-07-25 LAB — CBC
HCT: 43.5 % (ref 39.0–52.0)
Hemoglobin: 14.2 g/dL (ref 13.0–17.0)
MCH: 28.2 pg (ref 26.0–34.0)
MCHC: 32.6 g/dL (ref 30.0–36.0)
MCV: 86.5 fL (ref 80.0–100.0)
Platelets: 279 K/uL (ref 150–400)
RBC: 5.03 MIL/uL (ref 4.22–5.81)
RDW: 12.4 % (ref 11.5–15.5)
WBC: 10.2 K/uL (ref 4.0–10.5)
nRBC: 0 % (ref 0.0–0.2)

## 2024-07-25 LAB — BASIC METABOLIC PANEL WITH GFR
Anion gap: 12 (ref 5–15)
BUN: 19 mg/dL (ref 6–20)
CO2: 23 mmol/L (ref 22–32)
Calcium: 9.9 mg/dL (ref 8.9–10.3)
Chloride: 104 mmol/L (ref 98–111)
Creatinine, Ser: 1.85 mg/dL — ABNORMAL HIGH (ref 0.61–1.24)
GFR, Estimated: 45 mL/min — ABNORMAL LOW (ref >60)
Glucose, Bld: 97 mg/dL (ref 70–99)
Potassium: 4.8 mmol/L (ref 3.5–5.1)
Sodium: 139 mmol/L (ref 135–145)

## 2024-07-25 LAB — TROPONIN T, HIGH SENSITIVITY
Troponin T High Sensitivity: 17 ng/L (ref 0–19)
Troponin T High Sensitivity: 16 ng/L (ref 0–19)

## 2024-07-25 MED ORDER — LIDOCAINE VISCOUS HCL 2 % MT SOLN
15.0000 mL | Freq: Once | OROMUCOSAL | Status: AC
  Administered 2024-07-25: 15 mL via ORAL
  Filled 2024-07-25: qty 15

## 2024-07-25 MED ORDER — ALUM & MAG HYDROXIDE-SIMETH 200-200-20 MG/5ML PO SUSP
30.0000 mL | Freq: Once | ORAL | Status: AC
  Administered 2024-07-25: 30 mL via ORAL
  Filled 2024-07-25: qty 30

## 2024-07-25 NOTE — ED Provider Notes (Signed)
 [facility] Provider Note   CSN: [id number] Arrival date & time: 07/25/24  1225     Patient presents with: Chest Pain   Calvin Bailey is a 43 y.o. male.  Past medical history significant for GERD, CKD, CHF, hypertension, STEMI, and OSA presents today for 1 week of left-sided chest pain.  Patient denies fever, chills, nausea, vomiting, shortness of breath, leg swelling.    Chest Pain      Prior to Admission medications   Medication Sig Start Date End Date Taking? Authorizing Provider  acetaminophen  (TYLENOL ) 500 MG tablet Take 1 tablet (500 mg total) by mouth every 6 (six) hours as needed. 05/20/23   Horton, Charmaine FALCON, MD  albuterol  (VENTOLIN  HFA) 108 (90 Base) MCG/ACT inhaler Inhale 2 puffs into the lungs every 6 (six) hours as needed for wheezing or shortness of breath. 05/29/24   Kasa, Kurian, MD  amLODipine  (NORVASC ) 10 MG tablet Take 1 tablet (10 mg total) by mouth daily. 06/14/24   Ladona Heinz, MD  chlorthalidone  (HYGROTON ) 25 MG tablet Take 12.5 mg by mouth daily. 06/29/24   [provider]  cloNIDine  (CATAPRES ) 0.1 MG tablet Take 1 tablet (0.1 mg total) by mouth 3 (three) times daily. 06/13/24   Ladona Heinz, MD  empagliflozin  (JARDIANCE ) 10 MG TABS tablet Take 1 tablet (10 mg total) by mouth daily before breakfast. 04/05/24   Gerard Frederick, NP  furosemide  (LASIX ) 20 MG tablet Take 1 tablet (20 mg total) by mouth daily as needed for fluid or edema. 05/18/24   End, Lonni, MD  isosorbide -hydrALAZINE  (BIDIL ) 20-37.5 MG tablet Take 2 tablets by mouth 3 (three) times daily. 06/13/24   Henry Manuelita NOVAK, NP  labetalol  (NORMODYNE ) 300 MG tablet Take 1 tablet (300 mg total) by mouth 2 (two) times daily. 07/13/24   Tobb, Kardie, DO  nicotine  (NICODERM CQ  - DOSED IN MG/24 HOURS) 14 mg/24hr patch Place 1 patch (14 mg total) onto the skin daily. 06/14/24   Ladona Heinz, MD  nitroGLYCERIN  (NITROSTAT ) 0.4 MG SL tablet Place 1 tablet (0.4  mg total) under the tongue every 5 (five) minutes as needed for chest pain. 05/18/24   End, Lonni, MD  spironolactone  (ALDACTONE ) 25 MG tablet TAKE 1 TABLET(25 MG) BY MOUTH DAILY 06/29/24   Tobb, Kardie, DO  tirzepatide  (ZEPBOUND ) 5 MG/0.5ML Pen Inject 5 mg into the skin once a week. 07/04/24   Tobb, Kardie, DO  tiZANidine (ZANAFLEX) 4 MG tablet Take 4 mg by mouth every 8 (eight) hours as needed. 07/01/24   [provider]  valsartan  (DIOVAN ) 160 MG tablet Take 1 tablet (160 mg total) by mouth 2 (two) times daily. 07/17/24   Tobb, Kardie, DO  LISINOPRIL -HYDROCHLOROTHIAZIDE  PO Take by mouth.  07/06/19  [provider]    Allergies: Hydrochlorothiazide  and Lisinopril     Review of Systems  Cardiovascular:  Positive for chest pain.    Updated Vital Signs BP (!) 144/85 (BP Location: Right Arm)   Pulse 75   Temp 97.7 F (36.5 C)   Resp 18   Ht 6' 1 (1.854 m)   Wt (!) 172.4 kg   SpO2 96%   BMI 50.13 kg/m   Physical Exam Vitals and nursing note reviewed.  Constitutional:      General: He is not in acute distress.    Appearance: He is well-developed. He is obese.  HENT:     Head: Normocephalic and atraumatic.  Eyes:     Conjunctiva/sclera: Conjunctivae normal.  Cardiovascular:     Rate and Rhythm: Normal rate and regular rhythm.     Heart sounds: Normal heart sounds. No murmur heard. Pulmonary:     Effort: Pulmonary effort is normal. No respiratory distress.     Breath sounds: Normal breath sounds.  Abdominal:     Palpations: Abdomen is soft.     Tenderness: There is no abdominal tenderness.  Musculoskeletal:        General: No swelling.     Cervical back: Neck supple.     Comments: Chronic skin changes on bilateral lower extremities without pitting edema  Skin:    General: Skin is warm and dry.     Capillary Refill: Capillary refill takes less than 2 seconds.  Neurological:     General: No focal deficit present.     Mental Status: He is alert and  oriented to person, place, and time.  Psychiatric:        Mood and Affect: Mood normal.     (all labs ordered are listed, but only abnormal results are displayed) Labs Reviewed  BASIC METABOLIC PANEL WITH GFR - Abnormal; Notable for the following components:      Result Value   Creatinine, Ser 1.85 (*)    GFR, Estimated 45 (*)    All other components within normal limits  CBC  TROPONIN T, HIGH SENSITIVITY  TROPONIN T, HIGH SENSITIVITY    EKG: None  Radiology: DG Chest 2 View Result Date: 07/25/2024 EXAM: 2 VIEW(S) XRAY OF THE CHEST 07/25/2024 12:48:00 PM COMPARISON: 04/27/2024 CLINICAL HISTORY: chest pain. Pt c/o L chest pain x 1 week, unchanged FINDINGS: LUNGS AND PLEURA: No focal pulmonary opacity. No pulmonary edema. No pleural effusion. No pneumothorax. HEART AND MEDIASTINUM: No acute abnormality of the cardiac and mediastinal silhouettes. BONES AND SOFT TISSUES: No acute osseous abnormality. IMPRESSION: 1. No acute cardiopulmonary process. Electronically signed by: Franky Crease MD 07/25/2024 02:07 PM EDT RP Workstation: [id number]     Procedures   Medications Ordered in the ED  alum & mag hydroxide-simeth (MAALOX/MYLANTA) 200-200-20 MG/5ML suspension 30 mL (has no administration in time range)    And  lidocaine  (XYLOCAINE ) 2 % viscous mouth solution 15 mL (has no administration in time range)                                    Medical Decision Making Amount and/or Complexity of Data Reviewed Labs: ordered. Radiology: ordered.   This patient presents to the ED for concern of chest pain, this involves an extensive number of treatment options, and is a complaint that carries with it a high risk of complications and morbidity.  The differential diagnosis includes STEMI, NSTEMI, arrhythmia, GERD, anxiety, anemia, electrolyte abnormality   Co morbidities / Chronic conditions that complicate the patient evaluation  GERD, CKD, CHF, hypertension, STEMI, and  OSA   Additional history obtained:  Additional history obtained from EMR External records from outside source obtained and reviewed including cardiology and pulmonology notes   Lab Tests:  I Ordered, and personally interpreted labs.  The pertinent results include: Elevated creatinine at 1.85 which is slightly elevated from baseline, troponin 17, 16, CBC unremarkable   Imaging Studies ordered:  I ordered imaging studies including chest x-ray I independently visualized and interpreted imaging which showed no acute cardiopulmonary process I agree with the radiologist interpretation   Cardiac Monitoring: / EKG:  The patient was maintained on a cardiac  monitor.  I personally viewed and interpreted the cardiac monitored which showed an underlying rhythm of: Sinus rhythm   Problem List / ED Course / Critical interventions / Medication management I ordered medication including GI cocktail   I have reviewed the patients home medicines and have made adjustments as needed   Test / Admission - Considered:  Consider for admission or further workup however patient's vital signs, physical exam, labs, imaging are reassuring.  Patient advised to follow-up with his primary care cardiologist if his symptoms persist for further evaluation workup.  Patient given return precautions.  I feel patient safer discharge at this time.     Final diagnoses:  Atypical chest pain    ED Discharge Orders     None          Francis Ileana LOISE DEVONNA 07/25/24 1655    Darra Fonda MATSU, MD 07/26/24 [phone]

## 2024-07-25 NOTE — ED Triage Notes (Signed)
 Pt c/o L chest pain x 1 week, unchanged.

## 2024-07-25 NOTE — Discharge Instructions (Signed)
 Today you were seen for chest pain.  Your workup while in the emergency department was reassuring.  Please follow-up with your primary care or cardiologist if your symptoms persist.  Please return to the ED if you have crushing chest pain, worsening symptoms, or uncontrollable vomiting.  Thank you for letting us  treat you today. After reviewing your labs and imaging, I feel you are safe to go home. Please follow up with your PCP in the next several days and provide them with your records from this visit. Return to the Emergency Room if pain becomes severe or symptoms worsen.

## 2024-07-27 ENCOUNTER — Other Ambulatory Visit (HOSPITAL_BASED_OUTPATIENT_CLINIC_OR_DEPARTMENT_OTHER): Payer: Self-pay

## 2024-07-27 ENCOUNTER — Ambulatory Visit (HOSPITAL_BASED_OUTPATIENT_CLINIC_OR_DEPARTMENT_OTHER)
Admission: RE | Admit: 2024-07-27 | Discharge: 2024-07-27 | Disposition: A | Discharge status: Home / Self Care | Source: Ambulatory Visit | Attending: Internal Medicine | Admitting: Internal Medicine

## 2024-07-27 DIAGNOSIS — R911 Solitary pulmonary nodule: Secondary | ICD-10-CM | POA: Diagnosis present

## 2024-07-28 ENCOUNTER — Other Ambulatory Visit (HOSPITAL_BASED_OUTPATIENT_CLINIC_OR_DEPARTMENT_OTHER): Payer: Self-pay

## 2024-07-30 ENCOUNTER — Ambulatory Visit (HOSPITAL_BASED_OUTPATIENT_CLINIC_OR_DEPARTMENT_OTHER): Admitting: Family Medicine

## 2024-07-30 ENCOUNTER — Encounter: Payer: Self-pay | Admitting: Radiology

## 2024-07-30 ENCOUNTER — Encounter (HOSPITAL_BASED_OUTPATIENT_CLINIC_OR_DEPARTMENT_OTHER): Payer: Self-pay | Admitting: Family Medicine

## 2024-07-30 ENCOUNTER — Other Ambulatory Visit: Payer: Self-pay

## 2024-07-30 ENCOUNTER — Other Ambulatory Visit (HOSPITAL_BASED_OUTPATIENT_CLINIC_OR_DEPARTMENT_OTHER): Payer: Self-pay

## 2024-07-30 VITALS — BP 123/78 | HR 75 | Temp 98.5°F | Resp 18 | Ht 73.0 in | Wt 368.0 lb

## 2024-07-30 DIAGNOSIS — R079 Chest pain, unspecified: Secondary | ICD-10-CM | POA: Diagnosis not present

## 2024-07-30 MED ORDER — SPIRONOLACTONE 25 MG PO TABS
25.0000 mg | ORAL_TABLET | Freq: Once | ORAL | 0 refills | Status: AC
  Filled 2024-07-30: qty 90, 90d supply, fill #0

## 2024-07-30 NOTE — Progress Notes (Signed)
    Procedures performed today:    None.  Independent interpretation of notes and tests performed by another provider:   None.  Brief History, Exam, Impression, and Recommendations:    BP 123/78 (BP Location: Left Arm, Patient Position: Sitting, Cuff Size: Large)   Pulse 75   Temp 98.5 F (36.9 C) (Oral)   Resp 18   Ht 6' 1 (1.854 m)   Wt (!) 368 lb (166.9 kg)   SpO2 97%   BMI 48.55 kg/m   Chest pain, unspecified type Assessment & Plan: Patient reports recent issues with chest pain.  He does indicate that chest pain feels different than prior episodes of chest pain.  He has had multiple emergency department visits with chest pain earlier this year, was diagnosed with STEMI about 1 month ago when presenting to ED with chest pain.  Current chest pain feels different and is more related to positional changes.  He will feel some chest pain that will radiate up into neck and produce headache.  He wonders if it could be related to musculoskeletal etiology, specifically related to underlying known cervical disc disease.  Denies any current issues with shortness of breath or trouble breathing. Patient previously was working with physical therapy, however after recent STEMI he was advised on stopping PT and with recommendation to begin cardiac rehab program. On exam, patient is in no acute distress, vital signs stable.  Cardiovascular Sam with regular rate and rhythm.  Lungs clear to auscultation bilaterally. EKG completed in office today.  EKG shows normal sinus rhythm, normal QRS, no T wave abnormality. We discussed considerations, I do feel that current symptoms are likely related to musculoskeletal etiology.  Unfortunately, there is some limitation in regards to treatment options.  He cannot utilize NSAID at this point given cardiac history.  May continue with Tylenol .  He has also not able to proceed with PT as per cardiology.  Can utilize home exercise program as per PT.  Ultimately, may  need to consider resuming physical therapy once cleared following cardiac rehab.  He also indicates that there was discussion with orthopedist regarding injection therapy, can consider following up with orthopedist to review procedural recommendations.  Orders: -     EKG 12-Lead  Other orders -     Spironolactone ; Take 1 tablet (25 mg total) by mouth once for 1 dose.  Dispense: 90 tablet; Refill: 0  Return in about 4 months (around 11/27/2024).   ___________________________________________ Taima Rada [place], MD, ABFM, CAQSM Primary Care and Sports Medicine [facility]

## 2024-07-30 NOTE — Assessment & Plan Note (Signed)
 Patient reports recent issues with chest pain.  He does indicate that chest pain feels different than prior episodes of chest pain.  He has had multiple emergency department visits with chest pain earlier this year, was diagnosed with STEMI about 1 month ago when presenting to ED with chest pain.  Current chest pain feels different and is more related to positional changes.  He will feel some chest pain that will radiate up into neck and produce headache.  He wonders if it could be related to musculoskeletal etiology, specifically related to underlying known cervical disc disease.  Denies any current issues with shortness of breath or trouble breathing. Patient previously was working with physical therapy, however after recent STEMI he was advised on stopping PT and with recommendation to begin cardiac rehab program. On exam, patient is in no acute distress, vital signs stable.  Cardiovascular Sam with regular rate and rhythm.  Lungs clear to auscultation bilaterally. EKG completed in office today.  EKG shows normal sinus rhythm, normal QRS, no T wave abnormality. We discussed considerations, I do feel that current symptoms are likely related to musculoskeletal etiology.  Unfortunately, there is some limitation in regards to treatment options.  He cannot utilize NSAID at this point given cardiac history.  May continue with Tylenol .  He has also not able to proceed with PT as per cardiology.  Can utilize home exercise program as per PT.  Ultimately, may need to consider resuming physical therapy once cleared following cardiac rehab.  He also indicates that there was discussion with orthopedist regarding injection therapy, can consider following up with orthopedist to review procedural recommendations.

## 2024-08-06 ENCOUNTER — Ambulatory Visit: Payer: Self-pay | Admitting: Internal Medicine

## 2024-08-08 LAB — LAB REPORT - SCANNED
Albumin, Urine POC: 11.1
Creatinine, POC: 184.2 mg/dL
Microalb Creat Ratio: 6

## 2024-08-10 ENCOUNTER — Telehealth (HOSPITAL_BASED_OUTPATIENT_CLINIC_OR_DEPARTMENT_OTHER): Payer: Self-pay | Admitting: *Deleted

## 2024-08-10 NOTE — Telephone Encounter (Signed)
   Pre-operative Risk Assessment    Patient Name: Calvin Bailey  DOB: [birth year] MRN: [id number]   Date of last office visit: 07/12/24 SHERI HAMMOCK, NP Date of next office visit: 08/31/24 SHERI HAMMOCK, NP 3 MONTH F/U   Request for Surgical Clearance    Procedure:  C6-7 ANTERIOR CERVICAL FUSION  Date of Surgery:  Clearance TBD                                Surgeon:  DR. ALM MOLT Surgeon's Group or Practice Name:  [facility] Phone number:  [phone] Fax number:  [phone] EXT [phone] [name]   Type of Clearance Requested:   - Medical    Type of Anesthesia:  General    Additional requests/questions:    [name]   08/10/2024, 4:25 PM

## 2024-08-13 ENCOUNTER — Telehealth: Payer: Self-pay

## 2024-08-13 ENCOUNTER — Telehealth (HOSPITAL_COMMUNITY): Payer: Self-pay

## 2024-08-13 NOTE — Telephone Encounter (Signed)
 Preoperative team,   Patient has upcoming appointment scheduled with cardiology on 08/31/2024.  Please add preoperative cardiac evaluation to appointment notes.  I will defer cardiac evaluation to upcoming appointment.  Thank you for your help.  Josefa HERO. Hannia Matchett NP-C     08/13/2024, 11:57 AM [facility] [address] [place], [place] [address] Office [phone]

## 2024-08-13 NOTE — Telephone Encounter (Signed)
 Pt has appt 08/30/24 with Tylene Lunch, NP. I will update all parties involved.

## 2024-08-13 NOTE — Telephone Encounter (Signed)
 Confirmed cardiac orientation appointment time of 08/14/24 at 0800.

## 2024-08-13 NOTE — Telephone Encounter (Signed)
 Follow up on monitor

## 2024-08-14 ENCOUNTER — Encounter (HOSPITAL_COMMUNITY)
Admission: RE | Admit: 2024-08-14 | Discharge: 2024-08-14 | Disposition: A | Discharge status: Home / Self Care | Source: Ambulatory Visit | Attending: Cardiology | Admitting: Cardiology

## 2024-08-14 ENCOUNTER — Telehealth: Payer: Self-pay | Admitting: Pharmacist

## 2024-08-14 ENCOUNTER — Encounter (HOSPITAL_COMMUNITY): Payer: Self-pay

## 2024-08-14 ENCOUNTER — Other Ambulatory Visit: Payer: Self-pay | Admitting: Cardiology

## 2024-08-14 VITALS — BP 122/78 | HR 72 | Ht 73.63 in | Wt 358.0 lb

## 2024-08-14 DIAGNOSIS — I2542 Coronary artery dissection: Secondary | ICD-10-CM | POA: Diagnosis not present

## 2024-08-14 DIAGNOSIS — I2102 ST elevation (STEMI) myocardial infarction involving left anterior descending coronary artery: Secondary | ICD-10-CM | POA: Diagnosis present

## 2024-08-14 MED ORDER — ZEPBOUND 7.5 MG/0.5ML ~~LOC~~ SOAJ: 7.5000 mg | SUBCUTANEOUS | 1 refills | Status: DC

## 2024-08-14 NOTE — Progress Notes (Signed)
 Cardiac Rehab Medication Review by a Nurse  Does the patient  feel that his/her medications are working for him/her?  yes  Has the patient been experiencing any side effects to the medications prescribed?  yes  Does the patient measure his/her own blood pressure or blood glucose at home?  yes   Does the patient have any problems obtaining medications due to transportation or finances?   no  Understanding of regimen: good Understanding of indications: good Potential of compliance: good    Nurse comments: Raynald is taking his medications as prescribed and has a good understanding of what his medications are for. Rayquan says he has had difficulty sleeping since his cardiac event. Deanna has a BP cuff/ monitor. Rhythm checks his blood pressures every 2-3 days.    Hadassah Gaw [facility] RN 08/14/2024 8:19 AM

## 2024-08-14 NOTE — Progress Notes (Signed)
 Cardiac Individual Treatment Plan  Patient Details  Name: Calvin Bailey MRN: [id number] Date of Birth: [birth year] Referring Provider:   Flowsheet Row INTENSIVE CARDIAC REHAB ORIENT from 08/14/2024 in [facility]  Referring Provider Sheena Pugh, DO    Initial Encounter Date:  Flowsheet Row INTENSIVE CARDIAC REHAB ORIENT from 08/14/2024 in [facility]  Date 08/14/24    Visit Diagnosis: 06/11/24 STEMI  06/11/24 SCAD involving mid to distal LAD  Patient's Home Medications on Admission:  Current Outpatient Medications:    acetaminophen  (TYLENOL ) 500 MG tablet, Take 1 tablet (500 mg total) by mouth every 6 (six) hours as needed., Disp: 30 tablet, Rfl: 0   albuterol  (VENTOLIN  HFA) 108 (90 Base) MCG/ACT inhaler, Inhale 2 puffs into the lungs every 6 (six) hours as needed for wheezing or shortness of breath., Disp: 8 g, Rfl: 10   amLODipine  (NORVASC ) 10 MG tablet, Take 1 tablet (10 mg total) by mouth daily., Disp: 90 tablet, Rfl: 3   cloNIDine  (CATAPRES ) 0.1 MG tablet, Take 1 tablet (0.1 mg total) by mouth 3 (three) times daily., Disp: 60 tablet, Rfl: 2   empagliflozin  (JARDIANCE ) 10 MG TABS tablet, Take 1 tablet (10 mg total) by mouth daily before breakfast., Disp: 30 tablet, Rfl: 11   furosemide  (LASIX ) 20 MG tablet, Take 1 tablet (20 mg total) by mouth daily as needed for fluid or edema., Disp: , Rfl:    isosorbide -hydrALAZINE  (BIDIL ) 20-37.5 MG tablet, Take 2 tablets by mouth 3 (three) times daily., Disp: 180 tablet, Rfl: 2   labetalol  (NORMODYNE ) 300 MG tablet, Take 1 tablet (300 mg total) by mouth 2 (two) times daily., Disp: 180 tablet, Rfl: 1   nicotine  (NICODERM CQ  - DOSED IN MG/24 HOURS) 14 mg/24hr patch, Place 1 patch (14 mg total) onto the skin daily., Disp: 28 patch, Rfl: 0   nitroGLYCERIN  (NITROSTAT ) 0.4 MG SL tablet, Place 1 tablet (0.4 mg total) under the tongue  every 5 (five) minutes as needed for chest pain., Disp: 25 tablet, Rfl: PRN   spironolactone  (ALDACTONE ) 25 MG tablet, Take 1 tablet (25 mg total) by mouth once for 1 dose. (Patient taking differently: Take 25 mg by mouth daily.), Disp: 90 tablet, Rfl: 0   tiZANidine (ZANAFLEX) 4 MG tablet, Take 4 mg by mouth every 8 (eight) hours as needed., Disp: , Rfl:    valsartan  (DIOVAN ) 160 MG tablet, Take 1 tablet (160 mg total) by mouth 2 (two) times daily., Disp: 180 tablet, Rfl: 3   chlorthalidone  (HYGROTON ) 25 MG tablet, Take 12.5 mg by mouth daily. (Patient not taking: Reported on 08/14/2024), Disp: , Rfl:    tirzepatide  (ZEPBOUND ) 7.5 MG/0.5ML Pen, Inject 7.5 mg into the skin once a week., Disp: 2 mL, Rfl: 1  Past Medical History: Past Medical History:  Diagnosis Date   Arthritis    Cancer (HCC)    right kidney   CHF (congestive heart failure) (HCC)    pt. 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    Tobacco Use: Social History   Tobacco Use  Smoking Status Every  Day   Current packs/day: 0.25   Average packs/day: 0.1 packs/day for 18.2 years (1.8 ttl pk-yrs)   Types: Cigarettes   Start date: 06/08/2006   Passive exposure: Current  Smokeless Tobacco Never  Tobacco Comments   02/01/24 patient smokes 3-6 cigarettes daily    Labs: Review Flowsheet  More data exists      Latest Ref Rng & Units 10/26/2021 02/27/2022 03/10/2023 05/18/2024 06/11/2024  Labs for ITP Cardiac and Pulmonary Rehab  Cholestrol 0 - 200 mg/dL - 863  - - 866   LDL (calc) 0 - 99 mg/dL - 93  - - 43   HDL-C >59 mg/dL - 28  - - 31   Trlycerides <150 mg/dL - 74  - - 705   Hemoglobin A1c 4.8 - 5.6 % 5.4  5.4  5.5  - 5.3    PH, Arterial 7.35 - 7.45 - - - 7.320  -  PCO2 arterial 32 - 48 mmHg - - - 39.9  -  Bicarbonate 20.0 - 28.0 mmol/L - - - 20.6  23.0  -  TCO2 22 - 32 mmol/L - - - 22  24  -  Acid-base deficit 0.0 - 2.0 mmol/L - - - 5.0  4.0  -  O2 Saturation % - - - 95  70  -    Details       Multiple values from one day are sorted in reverse-chronological order         Capillary Blood Glucose: Lab Results  Component Value Date   GLUCAP 102 (H) 06/12/2024   GLUCAP 121 (H) 02/16/2023   GLUCAP 101 (H) 02/26/2022   GLUCAP 81 05/03/2013     Exercise Target Goals: Exercise Program Goal: Individual exercise prescription set using results from initial 6 min walk test and THRR while considering  patient's activity barriers and safety.   Exercise Prescription Goal: Initial exercise prescription builds to 30-45 minutes a day of aerobic activity, 2-3 days per week.  Home exercise guidelines will be given to patient during program as part of exercise prescription that the participant will acknowledge.  Activity Barriers & Risk Stratification:  Activity Barriers & Cardiac Risk Stratification - 08/14/24 0912       Activity Barriers & Cardiac Risk Stratification   Activity Barriers Arthritis;Back Problems;Other (comment)    Comments Herniated disc since May 2025 C6-C7, associated with chronic pain. Getting steroid injections.    Cardiac Risk Stratification High          6 Minute Walk:  6 Minute Walk     Row Name 08/14/24 0925         6 Minute Walk   Phase Initial     Distance 1224 feet     Walk Time 6 minutes     # of Rest Breaks 0     MPH 2.32     METS 2.76     RPE 12     Perceived Dyspnea  1     VO2 Peak 9.66     Symptoms Yes (comment)     Comments Mild shortness of breath. Back pain, 3-4/ 10 on pain scale. Chronic due to herniated disc C6-C7.     Resting HR 72 bpm     Resting BP 122/78     Resting Oxygen Saturation  98 %     Exercise Oxygen Saturation  during 6 min walk 97 %      Max Ex. HR 91 bpm     Max Ex. BP  122/60     2 Minute Post BP 106/60        Oxygen Initial Assessment:   Oxygen Re-Evaluation:   Oxygen Discharge (Final Oxygen Re-Evaluation):   Initial Exercise Prescription:  Initial Exercise Prescription - 08/14/24 1000       Date of Initial Exercise RX and Referring Provider   Date 08/14/24    Referring Provider Tobb, Kardie, DO    Expected Discharge Date 11/07/24      Recumbant Bike   Level 2    RPM 25    Watts 15    Minutes 15    METs 2      NuStep   Level 2    SPM 95    Minutes 15    METs 2.3      Prescription Details   Frequency (times per week) 3    Duration Progress to 30 minutes of continuous aerobic without signs/symptoms of physical distress      Intensity   THRR 40-80% of Max Heartrate 70-140    Ratings of Perceived Exertion 11-13    Perceived Dyspnea 0-4      Progression   Progression Continue to progress workloads to maintain intensity without signs/symptoms of physical distress.      Resistance Training   Training Prescription Yes    Weight 4 lbs    Reps 10-15          Perform Capillary Blood Glucose checks as needed.  Exercise Prescription Changes:   Exercise Comments:   Exercise Goals and Review:   Exercise Goals     Row Name 08/14/24 0803             Exercise Goals   Increase Physical Activity Yes       Intervention Provide advice, education, support and counseling about physical activity/exercise needs.;Develop an individualized exercise prescription for aerobic and resistive training based on initial evaluation findings, risk stratification, comorbidities and participant's personal goals.       Expected Outcomes Short Term: Attend rehab on a regular basis to increase amount of physical activity.;Long Term: Add in home exercise to make exercise part of routine and to increase amount of physical activity.;Long Term: Exercising regularly at least 3-5 days a week.       Increase  Strength and Stamina Yes       Intervention Provide advice, education, support and counseling about physical activity/exercise needs.;Develop an individualized exercise prescription for aerobic and resistive training based on initial evaluation findings, risk stratification, comorbidities and participant's personal goals.       Expected Outcomes Short Term: Increase workloads from initial exercise prescription for resistance, speed, and METs.;Short Term: Perform resistance training exercises routinely during rehab and add in resistance training at home;Long Term: Improve cardiorespiratory fitness, muscular endurance and strength as measured by increased METs and functional capacity ( )       Able to understand and use rate of perceived exertion (RPE) scale Yes       Intervention Provide education and explanation on how to use RPE scale       Expected Outcomes Short Term: Able to use RPE daily in rehab to express subjective intensity level;Long Term:  Able to use RPE to guide intensity level when exercising independently       Knowledge and understanding of Target Heart Rate Range (THRR) Yes       Intervention Provide education and explanation of THRR including how the numbers were predicted and where they are located for reference  Expected Outcomes Short Term: Able to state/look up THRR;Long Term: Able to use THRR to govern intensity when exercising independently;Short Term: Able to use daily as guideline for intensity in rehab       Able to check pulse independently Yes       Intervention Provide education and demonstration on how to check pulse in carotid and radial arteries.;Review the importance of being able to check your own pulse for safety during independent exercise       Expected Outcomes Short Term: Able to explain why pulse checking is important during independent exercise;Long Term: Able to check pulse independently and accurately       Understanding of Exercise Prescription Yes        Intervention Provide education, explanation, and written materials on patient's individual exercise prescription       Expected Outcomes Short Term: Able to explain program exercise prescription;Long Term: Able to explain home exercise prescription to exercise independently          Exercise Goals Re-Evaluation :   Discharge Exercise Prescription (Final Exercise Prescription Changes):   Nutrition:  Target Goals: Understanding of nutrition guidelines, daily intake of sodium 1500mg , cholesterol 200mg , calories 30% from fat and 7% or less from saturated fats, daily to have 5 or more servings of fruits and vegetables.  Biometrics:  Pre Biometrics - 08/14/24 0757       Pre Biometrics   Waist Circumference 56.5 inches    Hip Circumference 60 inches    Waist to Hip Ratio 0.94 %    Triceps Skinfold 35 mm    % Body Fat 44.3 %    Grip Strength 14 kg    Flexibility --   Not performed. Herniated disc in back.   Single Leg Stand 17.12 seconds           Nutrition Therapy Plan and Nutrition Goals:   Nutrition Assessments:  MEDIFICTS Score Key: >=70 Need to make dietary changes  40-70 Heart Healthy Diet <= 40 Therapeutic Level Cholesterol Diet    Picture Your Plate Scores: <59 Unhealthy dietary pattern with much room for improvement. 41-50 Dietary pattern unlikely to meet recommendations for good health and room for improvement. 51-60 More healthful dietary pattern, with some room for improvement.  >60 Healthy dietary pattern, although there may be some specific behaviors that could be improved.    Nutrition Goals Re-Evaluation:   Nutrition Goals Re-Evaluation:   Nutrition Goals Discharge (Final Nutrition Goals Re-Evaluation):   Psychosocial: Target Goals: Acknowledge presence or absence of significant depression and/or stress, maximize coping skills, provide positive support system. Participant is able to verbalize types and ability to use techniques and skills  needed for reducing stress and depression.  Initial Review & Psychosocial Screening:  Initial Psych Review & Screening - 08/14/24 0947       Initial Review   Current issues with Current Sleep Concerns;Current Anxiety/Panic;Current Stress Concerns    Source of Stress Concerns Chronic Illness;Occupation;Unable to participate in former interests or hobbies;Unable to perform yard/household activities    Comments Kairee says he has had some anxiety and depression regarding his recent MI, Shahzaib says he has had diffculty sleeping at night since his hospitalization. Discussed sleep hygiene. Enourage to discuss with his PCP Dr Penne if his symptoms persist.      Family Dynamics   Good Support System? Yes   Manson has his brothers for support. Demonta has 4 children aged 9-22.     Barriers   Psychosocial barriers to participate in  program The patient should benefit from training in stress management and relaxation.      Screening Interventions   Interventions Encouraged to exercise;Provide feedback about the scores to participant;To provide support and resources with identified psychosocial needs    Expected Outcomes Long Term Goal: Stressors or current issues are controlled or eliminated.;Short Term goal: Identification and review with participant of any Quality of Life or Depression concerns found by scoring the questionnaire.;Long Term goal: The participant improves quality of Life and PHQ9 Scores as seen by post scores and/or verbalization of changes          Quality of Life Scores:  Quality of Life - 08/14/24 1025       Quality of Life   Select Quality of Life      Quality of Life Scores   Health/Function Pre 15.82 %    Socioeconomic Pre 18.57 %    Psych/Spiritual Pre 17.07 %    Family Pre 22.8 %    GLOBAL Pre 17.73 %         Scores of 19 and below usually indicate a poorer quality of life in these areas.  A difference of  2-3 points is a clinically meaningful difference.  A  difference of 2-3 points in the total score of the Quality of Life Index has been associated with significant improvement in overall quality of life, self-image, physical symptoms, and general health in studies assessing change in quality of life.  PHQ-9: Review Flowsheet  More data exists      08/14/2024 07/30/2024 02/24/2024 02/02/2024 03/10/2023  Depression screen PHQ 2/9  Decreased Interest 0 0 0 0 1  Down, Depressed, Hopeless 0 0 0 0 0  PHQ - 2 Score 0 0 0 0 1  Altered sleeping 3 0 0 0 1  Tired, decreased energy 1 0 0 0 1  Change in appetite 0 0 0 0 1  Feeling bad or failure about yourself  0 0 0 0 0  Trouble concentrating 1 0 0 0 0  Moving slowly or fidgety/restless 0 0 0 0 0  Suicidal thoughts 0 0 0 0 0  PHQ-9 Score 5 0  0  0  4   Difficult doing work/chores Somewhat difficult Not difficult at all Not difficult at all Not difficult at all Somewhat difficult    Details       Data saved with a previous flowsheet row definition        Interpretation of Total Score  Total Score Depression Severity:  1-4 = Minimal depression, 5-9 = Mild depression, 10-14 = Moderate depression, 15-19 = Moderately severe depression, 20-27 = Severe depression   Psychosocial Evaluation and Intervention:   Psychosocial Re-Evaluation:   Psychosocial Discharge (Final Psychosocial Re-Evaluation):   Vocational Rehabilitation: Provide vocational rehab assistance to qualifying candidates.   Vocational Rehab Evaluation & Intervention:  Vocational Rehab - 08/14/24 0952       Initial Vocational Rehab Evaluation & Intervention   Assessment shows need for Vocational Rehabilitation No   Estel has been out of work due to oneok since may. Erek does not need vocational rehab for job retraining at this time.         Education: Education Goals: Education classes will be provided on a weekly basis, covering required topics. Participant will state understanding/return demonstration of topics  presented.     Core Videos: Exercise    Move It!  Clinical staff conducted group or individual video education with verbal and written material and  guidebook.  Patient learns the recommended Pritikin exercise program. Exercise with the goal of living a long, healthy life. Some of the health benefits of exercise include controlled diabetes, healthier blood pressure levels, improved cholesterol levels, improved heart and lung capacity, improved sleep, and better body composition. Everyone should speak with their doctor before starting or changing an exercise routine.  Biomechanical Limitations Clinical staff conducted group or individual video education with verbal and written material and guidebook.  Patient learns how biomechanical limitations can impact exercise and how we can mitigate and possibly overcome limitations to have an impactful and balanced exercise routine.  Body Composition Clinical staff conducted group or individual video education with verbal and written material and guidebook.  Patient learns that body composition (ratio of muscle mass to fat mass) is a key component to assessing overall fitness, rather than body weight alone. Increased fat mass, especially visceral belly fat, can put us  at increased risk for metabolic syndrome, type 2 diabetes, heart disease, and even death. It is recommended to combine diet and exercise (cardiovascular and resistance training) to improve your body composition. Seek guidance from your physician and exercise physiologist before implementing an exercise routine.  Exercise Action Plan Clinical staff conducted group or individual video education with verbal and written material and guidebook.  Patient learns the recommended strategies to achieve and enjoy long-term exercise adherence, including variety, self-motivation, self-efficacy, and positive decision making. Benefits of exercise include fitness, good health, weight management, more energy,  better sleep, less stress, and overall well-being.  Medical   Heart Disease Risk Reduction Clinical staff conducted group or individual video education with verbal and written material and guidebook.  Patient learns our heart is our most vital organ as it circulates oxygen, nutrients, white blood cells, and hormones throughout the entire body, and carries waste away. Data supports a plant-based eating plan like the Pritikin Program for its effectiveness in slowing progression of and reversing heart disease. The video provides a number of recommendations to address heart disease.   Metabolic Syndrome and Belly Fat  Clinical staff conducted group or individual video education with verbal and written material and guidebook.  Patient learns what metabolic syndrome is, how it leads to heart disease, and how one can reverse it and keep it from coming back. You have metabolic syndrome if you have 3 of the following 5 criteria: abdominal obesity, high blood pressure, high triglycerides, low HDL cholesterol, and high blood sugar.  Hypertension and Heart Disease Clinical staff conducted group or individual video education with verbal and written material and guidebook.  Patient learns that high blood pressure, or hypertension, is very common in the [place] . Hypertension is largely due to excessive salt intake, but other important risk factors include being overweight, physical inactivity, drinking too much alcohol, smoking, and not eating enough potassium from fruits and vegetables. High blood pressure is a leading risk factor for heart attack, stroke, congestive heart failure, dementia, kidney failure, and premature death. Long-term effects of excessive salt intake include stiffening of the arteries and thickening of heart muscle and organ damage. Recommendations include ways to reduce hypertension and the risk of heart disease.  Diseases of Our Time - Focusing on Diabetes Clinical staff conducted group  or individual video education with verbal and written material and guidebook.  Patient learns why the best way to stop diseases of our time is prevention, through food and other lifestyle changes. Medicine (such as prescription pills and surgeries) is often only a Band-Aid on the problem,  not a long-term solution. Most common diseases of our time include obesity, type 2 diabetes, hypertension, heart disease, and cancer. The Pritikin Program is recommended and has been proven to help reduce, reverse, and/or prevent the damaging effects of metabolic syndrome.  Nutrition   Overview of the Pritikin Eating Plan  Clinical staff conducted group or individual video education with verbal and written material and guidebook.  Patient learns about the Pritikin Eating Plan for disease risk reduction. The Pritikin Eating Plan emphasizes a wide variety of unrefined, minimally-processed carbohydrates, like fruits, vegetables, whole grains, and legumes. Go, Caution, and Stop food choices are explained. Plant-based and lean animal proteins are emphasized. Rationale provided for low sodium intake for blood pressure control, low added sugars for blood sugar stabilization, and low added fats and oils for coronary artery disease risk reduction and weight management.  Calorie Density  Clinical staff conducted group or individual video education with verbal and written material and guidebook.  Patient learns about calorie density and how it impacts the Pritikin Eating Plan. Knowing the characteristics of the food you choose will help you decide whether those foods will lead to weight gain or weight loss, and whether you want to consume more or less of them. Weight loss is usually a side effect of the Pritikin Eating Plan because of its focus on low calorie-dense foods.  Label Reading  Clinical staff conducted group or individual video education with verbal and written material and guidebook.  Patient learns about the Pritikin  recommended label reading guidelines and corresponding recommendations regarding calorie density, added sugars, sodium content, and whole grains.  Dining Out - Part 1  Clinical staff conducted group or individual video education with verbal and written material and guidebook.  Patient learns that restaurant meals can be sabotaging because they can be so high in calories, fat, sodium, and/or sugar. Patient learns recommended strategies on how to positively address this and avoid unhealthy pitfalls.  Facts on Fats  Clinical staff conducted group or individual video education with verbal and written material and guidebook.  Patient learns that lifestyle modifications can be just as effective, if not more so, as many medications for lowering your risk of heart disease. A Pritikin lifestyle can help to reduce your risk of inflammation and atherosclerosis (cholesterol build-up, or plaque, in the artery walls). Lifestyle interventions such as dietary choices and physical activity address the cause of atherosclerosis. A review of the types of fats and their impact on blood cholesterol levels, along with dietary recommendations to reduce fat intake is also included.  Nutrition Action Plan  Clinical staff conducted group or individual video education with verbal and written material and guidebook.  Patient learns how to incorporate Pritikin recommendations into their lifestyle. Recommendations include planning and keeping personal health goals in mind as an important part of their success.  Healthy Mind-Set    Healthy Minds, Bodies, Hearts  Clinical staff conducted group or individual video education with verbal and written material and guidebook.  Patient learns how to identify when they are stressed. Video will discuss the impact of that stress, as well as the many benefits of stress management. Patient will also be introduced to stress management techniques. The way we think, act, and feel has an impact on  our hearts.  How Our Thoughts Can Heal Our Hearts  Clinical staff conducted group or individual video education with verbal and written material and guidebook.  Patient learns that negative thoughts can cause depression and anxiety. This can result in  negative lifestyle behavior and serious health problems. Cognitive behavioral therapy is an effective method to help control our thoughts in order to change and improve our emotional outlook.  Additional Videos:  Exercise    Improving Performance  Clinical staff conducted group or individual video education with verbal and written material and guidebook.  Patient learns to use a non-linear approach by alternating intensity levels and lengths of time spent exercising to help burn more calories and lose more body fat. Cardiovascular exercise helps improve heart health, metabolism, hormonal balance, blood sugar control, and recovery from fatigue. Resistance training improves strength, endurance, balance, coordination, reaction time, metabolism, and muscle mass. Flexibility exercise improves circulation, posture, and balance. Seek guidance from your physician and exercise physiologist before implementing an exercise routine and learn your capabilities and proper form for all exercise.  Introduction to Yoga  Clinical staff conducted group or individual video education with verbal and written material and guidebook.  Patient learns about yoga, a discipline of the coming together of mind, breath, and body. The benefits of yoga include improved flexibility, improved range of motion, better posture and core strength, increased lung function, weight loss, and positive self-image. Yoga's heart health benefits include lowered blood pressure, healthier heart rate, decreased cholesterol and triglyceride levels, improved immune function, and reduced stress. Seek guidance from your physician and exercise physiologist before implementing an exercise routine and learn  your capabilities and proper form for all exercise.  Medical   Aging: Enhancing Your Quality of Life  Clinical staff conducted group or individual video education with verbal and written material and guidebook.  Patient learns key strategies and recommendations to stay in good physical health and enhance quality of life, such as prevention strategies, having an advocate, securing a Health Care Proxy and Power of Attorney, and keeping a list of medications and system for tracking them. It also discusses how to avoid risk for bone loss.  Biology of Weight Control  Clinical staff conducted group or individual video education with verbal and written material and guidebook.  Patient learns that weight gain occurs because we consume more calories than we burn (eating more, moving less). Even if your body weight is normal, you may have higher ratios of fat compared to muscle mass. Too much body fat puts you at increased risk for cardiovascular disease, heart attack, stroke, type 2 diabetes, and obesity-related cancers. In addition to exercise, following the Pritikin Eating Plan can help reduce your risk.  Decoding Lab Results  Clinical staff conducted group or individual video education with verbal and written material and guidebook.  Patient learns that lab test reflects one measurement whose values change over time and are influenced by many factors, including medication, stress, sleep, exercise, food, hydration, pre-existing medical conditions, and more. It is recommended to use the knowledge from this video to become more involved with your lab results and evaluate your numbers to speak with your doctor.   Diseases of Our Time - Overview  Clinical staff conducted group or individual video education with verbal and written material and guidebook.  Patient learns that according to the CDC, 50% to 70% of chronic diseases (such as obesity, type 2 diabetes, elevated lipids, hypertension, and heart disease)  are avoidable through lifestyle improvements including healthier food choices, listening to satiety cues, and increased physical activity.  Sleep Disorders Clinical staff conducted group or individual video education with verbal and written material and guidebook.  Patient learns how good quality and duration of sleep are important to overall  health and well-being. Patient also learns about sleep disorders and how they impact health along with recommendations to address them, including discussing with a physician.  Nutrition  Dining Out - Part 2 Clinical staff conducted group or individual video education with verbal and written material and guidebook.  Patient learns how to plan ahead and communicate in order to maximize their dining experience in a healthy and nutritious manner. Included are recommended food choices based on the type of restaurant the patient is visiting.   Fueling a Banker conducted group or individual video education with verbal and written material and guidebook.  There is a strong connection between our food choices and our health. Diseases like obesity and type 2 diabetes are very prevalent and are in large-part due to lifestyle choices. The Pritikin Eating Plan provides plenty of food and hunger-curbing satisfaction. It is easy to follow, affordable, and helps reduce health risks.  Menu Workshop  Clinical staff conducted group or individual video education with verbal and written material and guidebook.  Patient learns that restaurant meals can sabotage health goals because they are often packed with calories, fat, sodium, and sugar. Recommendations include strategies to plan ahead and to communicate with the manager, chef, or server to help order a healthier meal.  Planning Your Eating Strategy  Clinical staff conducted group or individual video education with verbal and written material and guidebook.  Patient learns about the Pritikin Eating Plan  and its benefit of reducing the risk of disease. The Pritikin Eating Plan does not focus on calories. Instead, it emphasizes high-quality, nutrient-rich foods. By knowing the characteristics of the foods, we choose, we can determine their calorie density and make informed decisions.  Targeting Your Nutrition Priorities  Clinical staff conducted group or individual video education with verbal and written material and guidebook.  Patient learns that lifestyle habits have a tremendous impact on disease risk and progression. This video provides eating and physical activity recommendations based on your personal health goals, such as reducing LDL cholesterol, losing weight, preventing or controlling type 2 diabetes, and reducing high blood pressure.  Vitamins and Minerals  Clinical staff conducted group or individual video education with verbal and written material and guidebook.  Patient learns different ways to obtain key vitamins and minerals, including through a recommended healthy diet. It is important to discuss all supplements you take with your doctor.   Healthy Mind-Set    Smoking Cessation  Clinical staff conducted group or individual video education with verbal and written material and guidebook.  Patient learns that cigarette smoking and tobacco addiction pose a serious health risk which affects millions of people. Stopping smoking will significantly reduce the risk of heart disease, lung disease, and many forms of cancer. Recommended strategies for quitting are covered, including working with your doctor to develop a successful plan.  Culinary   Becoming a Set Designer conducted group or individual video education with verbal and written material and guidebook.  Patient learns that cooking at home can be healthy, cost-effective, quick, and puts them in control. Keys to cooking healthy recipes will include looking at your recipe, assessing your equipment needs, planning  ahead, making it simple, choosing cost-effective seasonal ingredients, and limiting the use of added fats, salts, and sugars.  Cooking - Breakfast and Snacks  Clinical staff conducted group or individual video education with verbal and written material and guidebook.  Patient learns how important breakfast is to satiety and nutrition through the entire  day. Recommendations include key foods to eat during breakfast to help stabilize blood sugar levels and to prevent overeating at meals later in the day. Planning ahead is also a key component.  Cooking - Educational Psychologist conducted group or individual video education with verbal and written material and guidebook.  Patient learns eating strategies to improve overall health, including an approach to cook more at home. Recommendations include thinking of animal protein as a side on your plate rather than center stage and focusing instead on lower calorie dense options like vegetables, fruits, whole grains, and plant-based proteins, such as beans. Making sauces in large quantities to freeze for later and leaving the skin on your vegetables are also recommended to maximize your experience.  Cooking - Healthy Salads and Dressing Clinical staff conducted group or individual video education with verbal and written material and guidebook.  Patient learns that vegetables, fruits, whole grains, and legumes are the foundations of the Pritikin Eating Plan. Recommendations include how to incorporate each of these in flavorful and healthy salads, and how to create homemade salad dressings. Proper handling of ingredients is also covered. Cooking - Soups and [employer] - Soups and Desserts Clinical staff conducted group or individual video education with verbal and written material and guidebook.  Patient learns that Pritikin soups and desserts make for easy, nutritious, and delicious snacks and meal components that are low in sodium, fat, sugar,  and calorie density, while high in vitamins, minerals, and filling fiber. Recommendations include simple and healthy ideas for soups and desserts.   Overview     The Pritikin Solution Program Overview Clinical staff conducted group or individual video education with verbal and written material and guidebook.  Patient learns that the results of the Pritikin Program have been documented in more than 100 articles published in peer-reviewed journals, and the benefits include reducing risk factors for (and, in some cases, even reversing) high cholesterol, high blood pressure, type 2 diabetes, obesity, and more! An overview of the three key pillars of the Pritikin Program will be covered: eating well, doing regular exercise, and having a healthy mind-set.  WORKSHOPS  Exercise: Exercise Basics: Building Your Action Plan Clinical staff led group instruction and group discussion with PowerPoint presentation and patient guidebook. To enhance the learning environment the use of posters, models and videos may be added. At the conclusion of this workshop, patients will comprehend the difference between physical activity and exercise, as well as the benefits of incorporating both, into their routine. Patients will understand the FITT (Frequency, Intensity, Time, and Type) principle and how to use it to build an exercise action plan. In addition, safety concerns and other considerations for exercise and cardiac rehab will be addressed by the presenter. The purpose of this lesson is to promote a comprehensive and effective weekly exercise routine in order to improve patients' overall level of fitness.   Managing Heart Disease: Your Path to a Healthier Heart Clinical staff led group instruction and group discussion with PowerPoint presentation and patient guidebook. To enhance the learning environment the use of posters, models and videos may be added.At the conclusion of this workshop, patients will understand  the anatomy and physiology of the heart. Additionally, they will understand how Pritikin's three pillars impact the risk factors, the progression, and the management of heart disease.  The purpose of this lesson is to provide a high-level overview of the heart, heart disease, and how the Pritikin lifestyle positively impacts risk factors.  Exercise Biomechanics Clinical staff led group instruction and group discussion with PowerPoint presentation and patient guidebook. To enhance the learning environment the use of posters, models and videos may be added. Patients will learn how the structural parts of their bodies function and how these functions impact their daily activities, movement, and exercise. Patients will learn how to promote a neutral spine, learn how to manage pain, and identify ways to improve their physical movement in order to promote healthy living. The purpose of this lesson is to expose patients to common physical limitations that impact physical activity. Participants will learn practical ways to adapt and manage aches and pains, and to minimize their effect on regular exercise. Patients will learn how to maintain good posture while sitting, walking, and lifting.  Balance Training and Fall Prevention  Clinical staff led group instruction and group discussion with PowerPoint presentation and patient guidebook. To enhance the learning environment the use of posters, models and videos may be added. At the conclusion of this workshop, patients will understand the importance of their sensorimotor skills (vision, proprioception, and the vestibular system) in maintaining their ability to balance as they age. Patients will apply a variety of balancing exercises that are appropriate for their current level of function. Patients will understand the common causes for poor balance, possible solutions to these problems, and ways to modify their physical environment in order to minimize  their fall risk. The purpose of this lesson is to teach patients about the importance of maintaining balance as they age and ways to minimize their risk of falling.  WORKSHOPS   Nutrition:  Fueling a Ship Broker led group instruction and group discussion with PowerPoint presentation and patient guidebook. To enhance the learning environment the use of posters, models and videos may be added. Patients will review the foundational principles of the Pritikin Eating Plan and understand what constitutes a serving size in each of the food groups. Patients will also learn Pritikin-friendly foods that are better choices when away from home and review make-ahead meal and snack options. Calorie density will be reviewed and applied to three nutrition priorities: weight maintenance, weight loss, and weight gain. The purpose of this lesson is to reinforce (in a group setting) the key concepts around what patients are recommended to eat and how to apply these guidelines when away from home by planning and selecting Pritikin-friendly options. Patients will understand how calorie density may be adjusted for different weight management goals.  Mindful Eating  Clinical staff led group instruction and group discussion with PowerPoint presentation and patient guidebook. To enhance the learning environment the use of posters, models and videos may be added. Patients will briefly review the concepts of the Pritikin Eating Plan and the importance of low-calorie dense foods. The concept of mindful eating will be introduced as well as the importance of paying attention to internal hunger signals. Triggers for non-hunger eating and techniques for dealing with triggers will be explored. The purpose of this lesson is to provide patients with the opportunity to review the basic principles of the Pritikin Eating Plan, discuss the value of eating mindfully and how to measure internal cues of hunger and fullness using the  Hunger Scale. Patients will also discuss reasons for non-hunger eating and learn strategies to use for controlling emotional eating.  Targeting Your Nutrition Priorities Clinical staff led group instruction and group discussion with PowerPoint presentation and patient guidebook. To enhance the learning environment the use of posters, models and videos may be  added. Patients will learn how to determine their genetic susceptibility to disease by reviewing their family history. Patients will gain insight into the importance of diet as part of an overall healthy lifestyle in mitigating the impact of genetics and other environmental insults. The purpose of this lesson is to provide patients with the opportunity to assess their personal nutrition priorities by looking at their family history, their own health history and current risk factors. Patients will also be able to discuss ways of prioritizing and modifying the Pritikin Eating Plan for their highest risk areas  Menu  Clinical staff led group instruction and group discussion with PowerPoint presentation and patient guidebook. To enhance the learning environment the use of posters, models and videos may be added. Using menus brought in from [employer], or printed from [employer], patients will apply the Pritikin dining out guidelines that were presented in the [employer] video. Patients will also be able to practice these guidelines in a variety of provided scenarios. The purpose of this lesson is to provide patients with the opportunity to practice hands-on learning of the Pritikin Dining Out guidelines with actual menus and practice scenarios.  Label Reading Clinical staff led group instruction and group discussion with PowerPoint presentation and patient guidebook. To enhance the learning environment the use of posters, models and videos may be added. Patients will review and discuss the Pritikin label reading guidelines  presented in Pritikin's Label Reading Educational series video. Using fool labels brought in from local grocery stores and markets, patients will apply the label reading guidelines and determine if the packaged food meet the Pritikin guidelines. The purpose of this lesson is to provide patients with the opportunity to review, discuss, and practice hands-on learning of the Pritikin Label Reading guidelines with actual packaged food labels. Cooking School  Pritikin's [employer] are designed to teach patients ways to prepare quick, simple, and affordable recipes at home. The importance of nutrition's role in chronic disease risk reduction is reflected in its emphasis in the overall Pritikin program. By learning how to prepare essential core Pritikin Eating Plan recipes, patients will increase control over what they eat; be able to customize the flavor of foods without the use of added salt, sugar, or fat; and improve the quality of the food they consume. By learning a set of core recipes which are easily assembled, quickly prepared, and affordable, patients are more likely to prepare more healthy foods at home. These workshops focus on convenient breakfasts, simple entres, side dishes, and desserts which can be prepared with minimal effort and are consistent with nutrition recommendations for cardiovascular risk reduction. Cooking [employer] are taught by a armed forces logistics/support/administrative officer (RD) who has been trained by the [employer]. The chef or RD has a clear understanding of the importance of minimizing - if not completely eliminating - added fat, sugar, and sodium in recipes. Throughout the series of Cooking School Workshop sessions, patients will learn about healthy ingredients and efficient methods of cooking to build confidence in their capability to prepare    Cooking School weekly topics:  Adding Flavor- Sodium-Free  Fast and Healthy Breakfasts  Powerhouse Plant-Based  Proteins  Satisfying Salads and Dressings  Simple Sides and Sauces  International Cuisine-Spotlight on the [employer] Zones  Delicious Desserts  Savory Soups  [employer] - Meals in a Astronomer Appetizers and Snacks  Comforting Weekend Breakfasts  One-Pot Wonders   Fast Evening Meals  Easy Entertaining  Personalizing Your Pritikin Plate  WORKSHOPS   Healthy Mindset (Psychosocial):  Focused Goals, Sustainable Changes Clinical staff led group instruction and group discussion with PowerPoint presentation and patient guidebook. To enhance the learning environment the use of posters, models and videos may be added. Patients will be able to apply effective goal setting strategies to establish at least one personal goal, and then take consistent, meaningful action toward that goal. They will learn to identify common barriers to achieving personal goals and develop strategies to overcome them. Patients will also gain an understanding of how our mind-set can impact our ability to achieve goals and the importance of cultivating a positive and growth-oriented mind-set. The purpose of this lesson is to provide patients with a deeper understanding of how to set and achieve personal goals, as well as the tools and strategies needed to overcome common obstacles which may arise along the way.  From Head to Heart: The Power of a Healthy Outlook  Clinical staff led group instruction and group discussion with PowerPoint presentation and patient guidebook. To enhance the learning environment the use of posters, models and videos may be added. Patients will be able to recognize and describe the impact of emotions and mood on physical health. They will discover the importance of self-care and explore self-care practices which may work for them. Patients will also learn how to utilize the 4 C's to cultivate a healthier outlook and better manage stress and challenges. The purpose of this lesson is to demonstrate  to patients how a healthy outlook is an essential part of maintaining good health, especially as they continue their cardiac rehab journey.  Healthy Sleep for a Healthy Heart Clinical staff led group instruction and group discussion with PowerPoint presentation and patient guidebook. To enhance the learning environment the use of posters, models and videos may be added. At the conclusion of this workshop, patients will be able to demonstrate knowledge of the importance of sleep to overall health, well-being, and quality of life. They will understand the symptoms of, and treatments for, common sleep disorders. Patients will also be able to identify daytime and nighttime behaviors which impact sleep, and they will be able to apply these tools to help manage sleep-related challenges. The purpose of this lesson is to provide patients with a general overview of sleep and outline the importance of quality sleep. Patients will learn about a few of the most common sleep disorders. Patients will also be introduced to the concept of "sleep hygiene," and discover ways to self-manage certain sleeping problems through simple daily behavior changes. Finally, the workshop will motivate patients by clarifying the links between quality sleep and their goals of heart-healthy living.   Recognizing and Reducing Stress Clinical staff led group instruction and group discussion with PowerPoint presentation and patient guidebook. To enhance the learning environment the use of posters, models and videos may be added. At the conclusion of this workshop, patients will be able to understand the types of stress reactions, differentiate between acute and chronic stress, and recognize the impact that chronic stress has on their health. They will also be able to apply different coping mechanisms, such as reframing negative self-talk. Patients will have the opportunity to practice a variety of stress management techniques, such as deep  abdominal breathing, progressive muscle relaxation, and/or guided imagery.  The purpose of this lesson is to educate patients on the role of stress in their lives and to provide healthy techniques for coping with it.  Learning Barriers/Preferences:  Learning  Barriers/Preferences - 08/14/24 0953       Learning Barriers/Preferences   Learning Barriers Exercise Concerns   Samer has a herniated disk and has chronic lower back pain and right arm pain.   Learning Preferences Skilled Demonstration;Audio;Computer/Internet;Group Instruction;Video;Pictoral;Individual Instruction;Verbal Instruction          Education Topics:  Knowledge Questionnaire Score:  Knowledge Questionnaire Score - 08/14/24 1025       Knowledge Questionnaire Score   Pre Score 23/28          Core Components/Risk Factors/Patient Goals at Admission:  Personal Goals and Risk Factors at Admission - 08/14/24 0805       Core Components/Risk Factors/Patient Goals on Admission    Weight Management Yes;Obesity;Weight Loss    Intervention Weight Management/Obesity: Establish reasonable short term and long term weight goals.;Obesity: Provide education and appropriate resources to help participant work on and attain dietary goals.    Admit Weight 358 lb 1.6 oz (162.4 kg)    Goal Weight: Short Term 345 lb (156.5 kg)    Goal Weight: Long Term 275 lb (124.7 kg)    Expected Outcomes Short Term: Continue to assess and modify interventions until short term weight is achieved;Long Term: Adherence to nutrition and physical activity/exercise program aimed toward attainment of established weight goal;Weight Loss: Understanding of general recommendations for a balanced deficit meal plan, which promotes 1-2 lb weight loss per week and includes a negative energy balance of [phone] kcal/d    Tobacco Cessation Yes    Number of packs per day 0.1 ppd    Intervention Assist the participant in steps to quit. Provide individualized education  and counseling about committing to Tobacco Cessation, relapse prevention, and pharmacological support that can be provided by physician.;Education officer, environmental, assist with locating and accessing local/national Quit Smoking programs, and support quit date choice.    Expected Outcomes Short Term: Will demonstrate readiness to quit, by selecting a quit date.;Long Term: Complete abstinence from all tobacco products for at least 12 months from quit date.;Short Term: Will quit all tobacco product use, adhering to prevention of relapse plan.    Hypertension Yes    Intervention Provide education on lifestyle modifcations including regular physical activity/exercise, weight management, moderate sodium restriction and increased consumption of fresh fruit, vegetables, and low fat dairy, alcohol moderation, and smoking cessation.;Monitor prescription use compliance.    Expected Outcomes Short Term: Continued assessment and intervention until BP is < 140/2mm HG in hypertensive participants. < 130/61mm HG in hypertensive participants with diabetes, heart failure or chronic kidney disease.;Long Term: Maintenance of blood pressure at goal levels.    Stress Yes    Intervention Offer individual and/or small group education and counseling on adjustment to heart disease, stress management and health-related lifestyle change. Teach and support self-help strategies.;Refer participants experiencing significant psychosocial distress to appropriate mental health specialists for further evaluation and treatment. When possible, include family members and significant others in education/counseling sessions.    Expected Outcomes Short Term: Participant demonstrates changes in health-related behavior, relaxation and other stress management skills, ability to obtain effective social support, and compliance with psychotropic medications if prescribed.;Long Term: Emotional wellbeing is indicated by absence of clinically significant  psychosocial distress or social isolation.          Core Components/Risk Factors/Patient Goals Review:    Core Components/Risk Factors/Patient Goals at Discharge (Final Review):    ITP Comments:  ITP Comments     Row Name 08/14/24 [phone]  ITP Comments Medical Director- Dr. Wilbert Bihari, MD. Introduction to the [facility]/ Intensive Cardiac Rehab. Reviewed initial orientation folder with Jerona.          Comments: Jibreel attended orientation for the cardiac rehabilitation program on  08/14/2024  to perform initial intake and exercise walk test. He was introduced to the [employer] education and orientation packet was reviewed. Completed 6-minute walk test, measurements, initial ITP, and exercise prescription. Vital signs stable. Telemetry-normal sinus rhythm, mild shortness of breath during walk test. Resolved with rest.   Service time was from 757 to 938. Arnoldo CHRISTELLA Gal, MS, ACSM CEP 08/14/2024 1028

## 2024-08-14 NOTE — Telephone Encounter (Signed)
 Received call from patient - reports tolerates Zepbound  5 mg dose well ready to move on to 7.5 mg dose. Updated prescription sent to pharmacy. Lost around 39 lbs and exercising 3 times per week  3

## 2024-08-15 ENCOUNTER — Other Ambulatory Visit (HOSPITAL_BASED_OUTPATIENT_CLINIC_OR_DEPARTMENT_OTHER): Payer: Self-pay

## 2024-08-15 MED ORDER — ZEPBOUND 5 MG/0.5ML ~~LOC~~ SOAJ
5.0000 mg | SUBCUTANEOUS | 0 refills | Status: DC
  Filled 2024-08-15: qty 2, 28d supply, fill #0

## 2024-08-16 ENCOUNTER — Telehealth: Payer: Self-pay | Admitting: Cardiology

## 2024-08-16 ENCOUNTER — Other Ambulatory Visit (HOSPITAL_BASED_OUTPATIENT_CLINIC_OR_DEPARTMENT_OTHER): Payer: Self-pay

## 2024-08-16 ENCOUNTER — Other Ambulatory Visit: Payer: Self-pay

## 2024-08-16 ENCOUNTER — Other Ambulatory Visit: Payer: Self-pay | Admitting: Cardiology

## 2024-08-16 NOTE — Telephone Encounter (Signed)
 1. Which medications need to be refilled? (please list name of each medication and dose if known) cloNIDine  (CATAPRES ) 0.1 MG tablet      2. Would you like to learn more about the convenience, safety, & potential cost savings by using the [facility]? no     3. Are you open to using the [facility] (Type [facility]. no     4. Which pharmacy/location (including street and city if local pharmacy) is medication to be sent to?  [facility]       5. Do they need a 30 day or 90 day supply? 30 day

## 2024-08-17 ENCOUNTER — Other Ambulatory Visit (HOSPITAL_COMMUNITY): Payer: Self-pay

## 2024-08-17 MED ORDER — CLONIDINE HCL 0.1 MG PO TABS
0.1000 mg | ORAL_TABLET | Freq: Three times a day (TID) | ORAL | 0 refills | Status: DC
  Filled 2024-08-17 – 2024-08-21 (×2): qty 90, 30d supply, fill #0

## 2024-08-17 NOTE — Telephone Encounter (Signed)
 Refill sent.

## 2024-08-20 ENCOUNTER — Encounter (HOSPITAL_COMMUNITY)
Admission: RE | Admit: 2024-08-20 | Discharge: 2024-08-20 | Disposition: A | Discharge status: Home / Self Care | Source: Ambulatory Visit | Attending: Cardiology | Admitting: Cardiology

## 2024-08-20 DIAGNOSIS — I2102 ST elevation (STEMI) myocardial infarction involving left anterior descending coronary artery: Secondary | ICD-10-CM | POA: Diagnosis not present

## 2024-08-20 DIAGNOSIS — I2542 Coronary artery dissection: Secondary | ICD-10-CM

## 2024-08-20 NOTE — Progress Notes (Signed)
 Daily Session Note  Patient Details  Name: Calvin Bailey MRN: [id number] Date of Birth: [birth year] Referring Provider:   Flowsheet Row INTENSIVE CARDIAC REHAB ORIENT from 08/14/2024 in [facility]  Referring Provider Calvin Pugh, DO    Encounter Date: 08/20/2024  Check In:  Session Check In - 08/20/24 0850       Check-In   Supervising physician immediately available to respond to emergencies [facility] MD immediately available    Physician(s) Calvin Mose, NP    Location [facility]    Staff Present Hadassah Quan, RN, Avonne Gal, MS, ACSM-CEP, Exercise Physiologist;Jetta Vannie BS, ACSM-CEP, Exercise Physiologist;Joseph Lennon, RN, Mallory Parkins, MS, ACSM-CEP, CCRP, Exercise Physiologist    Virtual Visit No    Medication changes reported     No    Fall or balance concerns reported    No    Tobacco Cessation No Change    Warm-up and Cool-down Performed as group-led instruction    Resistance Training Performed Yes    VAD Patient? No    PAD/SET Patient? No      Pain Assessment   Currently in Pain? No/denies    Pain Score 0-No pain    Multiple Pain Sites No          Capillary Blood Glucose: No results found for this or any previous visit (from the past 24 hours).   Exercise Prescription Changes - 08/20/24 1400       Response to Exercise   Blood Pressure (Admit) 106/72    Blood Pressure (Exercise) 142/70    Blood Pressure (Exit) 110/70    Heart Rate (Admit) 73 bpm    Heart Rate (Exercise) 100 bpm    Heart Rate (Exit) 85 bpm    Rating of Perceived Exertion (Exercise) 10.5    Symptoms None    Comments Pt's first day in the [facility] program.    Duration Continue with 30 min of aerobic exercise without signs/symptoms of physical distress.    Intensity THRR unchanged      Progression   Progression Continue to progress workloads to maintain intensity without signs/symptoms of physical  distress.    Average METs 2.3      Resistance Training   Training Prescription Yes    Weight 4 lbs    Reps 10-15    Time 5 Minutes      Interval Training   Interval Training No      Recumbant Bike   Level 2    RPM 59    Watts 15    Minutes 15    METs 1.9      NuStep   Level 2    SPM 86    Minutes 15    METs 2.3          Social History   Tobacco Use  Smoking Status Every Day   Current packs/day: 0.25   Average packs/day: 0.1 packs/day for 18.2 years (1.8 ttl pk-yrs)   Types: Cigarettes   Start date: 06/08/2006   Passive exposure: Current  Smokeless Tobacco Never  Tobacco Comments   02/01/24 patient smokes 3-6 cigarettes daily    Goals Met:  Exercise tolerated well No report of concerns or symptoms today Strength training completed today  Goals Unmet:  Not Applicable  Comments: Pt started cardiac rehab today.  Pt tolerated light exercise without difficulty. VSS, telemetry-Sinus Rhythm, asymptomatic.  Medication list reconciled. Pt denies barriers to medicaiton compliance.  PSYCHOSOCIAL  ASSESSMENT:  PHQ-5. Pt exhibits positive coping skills, hopeful outlook with supportive family. No psychosocial needs identified at this time, no psychosocial interventions necessary. Calvin Bailey says he has difficulty sleeping at night discussed sleep hygiene. Calvin Bailey says he has experienced low energy since his MI in September.   Pt enjoys real estate.   Pt oriented to exercise equipment and routine.    Understanding verbalized.[name] RN BSN    Dr. Wilbert Bihari is Medical Director for Cardiac Rehab at [facility].

## 2024-08-21 ENCOUNTER — Other Ambulatory Visit (HOSPITAL_COMMUNITY): Payer: Self-pay

## 2024-08-21 ENCOUNTER — Other Ambulatory Visit (HOSPITAL_BASED_OUTPATIENT_CLINIC_OR_DEPARTMENT_OTHER): Payer: Self-pay

## 2024-08-22 ENCOUNTER — Encounter (HOSPITAL_COMMUNITY)
Admission: RE | Admit: 2024-08-22 | Discharge: 2024-08-22 | Disposition: A | Discharge status: Home / Self Care | Source: Ambulatory Visit | Attending: Cardiology | Admitting: Cardiology

## 2024-08-22 DIAGNOSIS — I2102 ST elevation (STEMI) myocardial infarction involving left anterior descending coronary artery: Secondary | ICD-10-CM

## 2024-08-22 DIAGNOSIS — I2542 Coronary artery dissection: Secondary | ICD-10-CM

## 2024-08-24 ENCOUNTER — Encounter (HOSPITAL_COMMUNITY)

## 2024-08-27 ENCOUNTER — Encounter (HOSPITAL_COMMUNITY)
Admission: RE | Admit: 2024-08-27 | Discharge: 2024-08-27 | Disposition: A | Source: Ambulatory Visit | Attending: Cardiology

## 2024-08-27 DIAGNOSIS — I2102 ST elevation (STEMI) myocardial infarction involving left anterior descending coronary artery: Secondary | ICD-10-CM | POA: Diagnosis present

## 2024-08-27 DIAGNOSIS — I2542 Coronary artery dissection: Secondary | ICD-10-CM | POA: Insufficient documentation

## 2024-08-27 NOTE — Progress Notes (Signed)
 QUALITY OF LIFE SCORE REVIEW  Pt completed Quality of Life survey as a participant in Cardiac Rehab.  Scores 21.0 or below are considered low.  Pt score very low in several areas Overall 17.73, Health and Function 15.82, socioeconomic 18.57, physiological and spiritual 17.07, family 22.8 . Patient quality of life slightly altered by physical constraints which limits ability to perform as prior to recent cardiac illness.  Calvin Bailey says he has changed his cardiology follow up to [facility] in [place] this better meets his needs. [name] denies being depressed. [name] has not had any chest pain. [name] says that participating in cardiac rehab has been helpful for him.  [name] says he feels better since he has lost weight. Offered emotional support and reassurance.  Will continue to monitor and intervene as necessary. [name] RN BSN

## 2024-08-27 NOTE — Progress Notes (Signed)
 Cardiac Individual Treatment Plan  Patient Details  Name: Calvin Bailey MRN: [id number] Date of Birth: [birth year] Referring Provider:   Flowsheet Row INTENSIVE CARDIAC REHAB ORIENT from 08/14/2024 in [facility]  Referring Provider Sheena Pugh, DO    Initial Encounter Date:  Flowsheet Row INTENSIVE CARDIAC REHAB ORIENT from 08/14/2024 in [facility]  Date 08/14/24    Visit Diagnosis: 06/11/24 STEMI  06/11/24 SCAD involving mid to distal LAD  Patient's Home Medications on Admission:  Current Outpatient Medications:    acetaminophen  (TYLENOL ) 500 MG tablet, Take 1 tablet (500 mg total) by mouth every 6 (six) hours as needed., Disp: 30 tablet, Rfl: 0   albuterol  (VENTOLIN  HFA) 108 (90 Base) MCG/ACT inhaler, Inhale 2 puffs into the lungs every 6 (six) hours as needed for wheezing or shortness of breath., Disp: 8 g, Rfl: 10   amLODipine  (NORVASC ) 10 MG tablet, Take 1 tablet (10 mg total) by mouth daily., Disp: 90 tablet, Rfl: 3   chlorthalidone  (HYGROTON ) 25 MG tablet, Take 12.5 mg by mouth daily. (Patient not taking: Reported on 08/14/2024), Disp: , Rfl:    cloNIDine  (CATAPRES ) 0.1 MG tablet, Take 1 tablet (0.1 mg total) by mouth 3 (three) times daily., Disp: 90 tablet, Rfl: 0   empagliflozin  (JARDIANCE ) 10 MG TABS tablet, Take 1 tablet (10 mg total) by mouth daily before breakfast., Disp: 30 tablet, Rfl: 11   furosemide  (LASIX ) 20 MG tablet, Take 1 tablet (20 mg total) by mouth daily as needed for fluid or edema., Disp: , Rfl:    isosorbide -hydrALAZINE  (BIDIL ) 20-37.5 MG tablet, Take 2 tablets by mouth 3 (three) times daily., Disp: 180 tablet, Rfl: 2   labetalol  (NORMODYNE ) 300 MG tablet, Take 1 tablet (300 mg total) by mouth 2 (two) times daily., Disp: 180 tablet, Rfl: 1   nicotine  (NICODERM CQ  - DOSED IN MG/24 HOURS) 14 mg/24hr patch, Place 1 patch (14 mg total) onto  the skin daily., Disp: 28 patch, Rfl: 0   nitroGLYCERIN  (NITROSTAT ) 0.4 MG SL tablet, Place 1 tablet (0.4 mg total) under the tongue every 5 (five) minutes as needed for chest pain., Disp: 25 tablet, Rfl: PRN   spironolactone  (ALDACTONE ) 25 MG tablet, Take 1 tablet (25 mg total) by mouth once for 1 dose. (Patient taking differently: Take 25 mg by mouth daily.), Disp: 90 tablet, Rfl: 0   tirzepatide  (ZEPBOUND ) 5 MG/0.5ML Pen, Inject 5 mg into the skin once a week., Disp: 2 mL, Rfl: 0   tirzepatide  (ZEPBOUND ) 7.5 MG/0.5ML Pen, Inject 7.5 mg into the skin once a week., Disp: 2 mL, Rfl: 1   tiZANidine (ZANAFLEX) 4 MG tablet, Take 4 mg by mouth every 8 (eight) hours as needed., Disp: , Rfl:    valsartan  (DIOVAN ) 160 MG tablet, Take 1 tablet (160 mg total) by mouth 2 (two) times daily., Disp: 180 tablet, Rfl: 3  Past Medical History: Past Medical History:  Diagnosis Date   Arthritis    Cancer (HCC)    right kidney   CHF (congestive heart failure) (HCC)    pt. 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    Tobacco Use: Social History   Tobacco Use  Smoking Status Every Day   Current packs/day: 0.25   Average packs/day: 0.1 packs/day for 18.2 years (1.9 ttl pk-yrs)   Types: Cigarettes   Start date: 06/08/2006   Passive exposure: Current  Smokeless Tobacco Never  Tobacco Comments   02/01/24 patient smokes 3-6 cigarettes daily    Labs: Review Flowsheet  More data exists      Latest Ref Rng & Units 10/26/2021 02/27/2022 03/10/2023 05/18/2024 06/11/2024  Labs for ITP Cardiac and Pulmonary Rehab  Cholestrol 0 - 200 mg/dL - 863  - - 866   LDL (calc) 0 - 99 mg/dL - 93  - - 43   HDL-C >59 mg/dL -  28  - - 31   Trlycerides <150 mg/dL - 74  - - 705   Hemoglobin A1c 4.8 - 5.6 % 5.4  5.4  5.5  - 5.3   PH, Arterial 7.35 - 7.45 - - - 7.320  -  PCO2 arterial 32 - 48 mmHg - - - 39.9  -  Bicarbonate 20.0 - 28.0 mmol/L - - - 20.6  23.0  -  TCO2 22 - 32 mmol/L - - - 22  24  -  Acid-base deficit 0.0 - 2.0 mmol/L - - - 5.0  4.0  -  O2 Saturation % - - - 95  70  -    Details       Multiple values from one day are sorted in reverse-chronological order         Capillary Blood Glucose: Lab Results  Component Value Date   GLUCAP 102 (H) 06/12/2024   GLUCAP 121 (H) 02/16/2023   GLUCAP 101 (H) 02/26/2022   GLUCAP 81 05/03/2013     Exercise Target Goals: Exercise Program Goal: Individual exercise prescription set using results from initial 6 min walk test and THRR while considering  patient's activity barriers and safety.   Exercise Prescription Goal: Initial exercise prescription builds to 30-45 minutes a day of aerobic activity, 2-3 days per week.  Home exercise guidelines will be given to patient during program as part of exercise prescription that the participant will acknowledge.  Activity Barriers & Risk Stratification:  Activity Barriers & Cardiac Risk Stratification - 08/14/24 0912       Activity Barriers & Cardiac Risk Stratification   Activity Barriers Arthritis;Back Problems;Other (comment)    Comments Herniated disc since May 2025 C6-C7, associated with chronic pain. Getting steroid injections.    Cardiac Risk Stratification High          6 Minute Walk:  6 Minute Walk     Row Name 08/14/24 0925         6 Minute Walk   Phase Initial     Distance 1224 feet     Walk Time 6 minutes     # of Rest Breaks 0     MPH 2.32     METS 2.76     RPE 12     Perceived Dyspnea  1     VO2 Peak 9.66     Symptoms Yes (comment)     Comments Mild shortness of breath. Back pain, 3-4/ 10 on pain scale. Chronic due to herniated disc C6-C7.     Resting HR 72 bpm     Resting BP  122/78     Resting Oxygen Saturation  98 %     Exercise Oxygen Saturation  during  6 min walk 97 %     Max Ex. HR 91 bpm     Max Ex. BP 122/60     2 Minute Post BP 106/60        Oxygen Initial Assessment:   Oxygen Re-Evaluation:   Oxygen Discharge (Final Oxygen Re-Evaluation):   Initial Exercise Prescription:  Initial Exercise Prescription - 08/14/24 1000       Date of Initial Exercise RX and Referring Provider   Date 08/14/24    Referring Provider Tobb, Kardie, DO    Expected Discharge Date 11/07/24      Recumbant Bike   Level 2    RPM 25    Watts 15    Minutes 15    METs 2      NuStep   Level 2    SPM 95    Minutes 15    METs 2.3      Prescription Details   Frequency (times per week) 3    Duration Progress to 30 minutes of continuous aerobic without signs/symptoms of physical distress      Intensity   THRR 40-80% of Max Heartrate 70-140    Ratings of Perceived Exertion 11-13    Perceived Dyspnea 0-4      Progression   Progression Continue to progress workloads to maintain intensity without signs/symptoms of physical distress.      Resistance Training   Training Prescription Yes    Weight 4 lbs    Reps 10-15          Perform Capillary Blood Glucose checks as needed.  Exercise Prescription Changes:   Exercise Prescription Changes     Row Name 08/20/24 1400             Response to Exercise   Blood Pressure (Admit) 106/72       Blood Pressure (Exercise) 142/70       Blood Pressure (Exit) 110/70       Heart Rate (Admit) 73 bpm       Heart Rate (Exercise) 100 bpm       Heart Rate (Exit) 85 bpm       Rating of Perceived Exertion (Exercise) 10.5       Symptoms None       Comments Pt's first day in the CRP2 program.       Duration Continue with 30 min of aerobic exercise without signs/symptoms of physical distress.       Intensity THRR unchanged         Progression   Progression Continue to progress workloads to maintain intensity without  signs/symptoms of physical distress.       Average METs 2.3         Resistance Training   Training Prescription Yes       Weight 4 lbs       Reps 10-15       Time 5 Minutes         Interval Training   Interval Training No         Recumbant Bike   Level 2       RPM 59       Watts 15       Minutes 15       METs 1.9         NuStep   Level 2       SPM 86       Minutes 15       METs 2.3  Exercise Comments:   Exercise Comments     Row Name 08/20/24 1435           Exercise Comments Pt's first day in the CRP2 program. Pt exercised without complaints.          Exercise Goals and Review:   Exercise Goals     Row Name 08/14/24 0803             Exercise Goals   Increase Physical Activity Yes       Intervention Provide advice, education, support and counseling about physical activity/exercise needs.;Develop an individualized exercise prescription for aerobic and resistive training based on initial evaluation findings, risk stratification, comorbidities and participant's personal goals.       Expected Outcomes Short Term: Attend rehab on a regular basis to increase amount of physical activity.;Long Term: Add in home exercise to make exercise part of routine and to increase amount of physical activity.;Long Term: Exercising regularly at least 3-5 days a week.       Increase Strength and Stamina Yes       Intervention Provide advice, education, support and counseling about physical activity/exercise needs.;Develop an individualized exercise prescription for aerobic and resistive training based on initial evaluation findings, risk stratification, comorbidities and participant's personal goals.       Expected Outcomes Short Term: Increase workloads from initial exercise prescription for resistance, speed, and METs.;Short Term: Perform resistance training exercises routinely during rehab and add in resistance training at home;Long Term: Improve cardiorespiratory fitness,  muscular endurance and strength as measured by increased METs and functional capacity ( )       Able to understand and use rate of perceived exertion (RPE) scale Yes       Intervention Provide education and explanation on how to use RPE scale       Expected Outcomes Short Term: Able to use RPE daily in rehab to express subjective intensity level;Long Term:  Able to use RPE to guide intensity level when exercising independently       Knowledge and understanding of Target Heart Rate Range (THRR) Yes       Intervention Provide education and explanation of THRR including how the numbers were predicted and where they are located for reference       Expected Outcomes Short Term: Able to state/look up THRR;Long Term: Able to use THRR to govern intensity when exercising independently;Short Term: Able to use daily as guideline for intensity in rehab       Able to check pulse independently Yes       Intervention Provide education and demonstration on how to check pulse in carotid and radial arteries.;Review the importance of being able to check your own pulse for safety during independent exercise       Expected Outcomes Short Term: Able to explain why pulse checking is important during independent exercise;Long Term: Able to check pulse independently and accurately       Understanding of Exercise Prescription Yes       Intervention Provide education, explanation, and written materials on patient's individual exercise prescription       Expected Outcomes Short Term: Able to explain program exercise prescription;Long Term: Able to explain home exercise prescription to exercise independently          Exercise Goals Re-Evaluation :  Exercise Goals Re-Evaluation     Row Name 08/20/24 1432             Exercise Goal Re-Evaluation   Exercise Goals Review Increase Physical  Activity;Able to understand and use Dyspnea scale;Increase Strength and Stamina;Knowledge and understanding of Target Heart Rate Range  (THRR);Able to understand and use rate of perceived exertion (RPE) scale       Comments Pt's first day in the CRP2 program. Pt understnads the exercise Rx, RPE scale and THRR.       Expected Outcomes Will continue to monitor the patient and progress exercise workloads as tolerated.          Discharge Exercise Prescription (Final Exercise Prescription Changes):  Exercise Prescription Changes - 08/20/24 1400       Response to Exercise   Blood Pressure (Admit) 106/72    Blood Pressure (Exercise) 142/70    Blood Pressure (Exit) 110/70    Heart Rate (Admit) 73 bpm    Heart Rate (Exercise) 100 bpm    Heart Rate (Exit) 85 bpm    Rating of Perceived Exertion (Exercise) 10.5    Symptoms None    Comments Pt's first day in the CRP2 program.    Duration Continue with 30 min of aerobic exercise without signs/symptoms of physical distress.    Intensity THRR unchanged      Progression   Progression Continue to progress workloads to maintain intensity without signs/symptoms of physical distress.    Average METs 2.3      Resistance Training   Training Prescription Yes    Weight 4 lbs    Reps 10-15    Time 5 Minutes      Interval Training   Interval Training No      Recumbant Bike   Level 2    RPM 59    Watts 15    Minutes 15    METs 1.9      NuStep   Level 2    SPM 86    Minutes 15    METs 2.3          Nutrition:  Target Goals: Understanding of nutrition guidelines, daily intake of sodium 1500mg , cholesterol 200mg , calories 30% from fat and 7% or less from saturated fats, daily to have 5 or more servings of fruits and vegetables.  Biometrics:  Pre Biometrics - 08/14/24 0757       Pre Biometrics   Waist Circumference 56.5 inches    Hip Circumference 60 inches    Waist to Hip Ratio 0.94 %    Triceps Skinfold 35 mm    % Body Fat 44.3 %    Grip Strength 14 kg    Flexibility --   Not performed. Herniated disc in back.   Single Leg Stand 17.12 seconds            Nutrition Therapy Plan and Nutrition Goals:  Nutrition Therapy & Goals - 08/20/24 0946       Nutrition Therapy   Diet Heart Healthy      Personal Nutrition Goals   Nutrition Goal Patient to identify strategies for reducing cardiovascular risk by attending the [employer] education and nutrition series weekly.    Personal Goal #2 Patient to improve diet quality by using the plate method as a guide for meal planning to include lean protein/plant protein, fruits, vegetables, whole grains, nonfat dairy as part of a well-balanced diet.    Personal Goal #3 Patient to identify strategies for weight loss with goal of 0.5-2 # per week of weight loss.    Comments Patient with medical history significant for hypertension, hyperlipidemia, OSA, CKD, morbid obesity, tobacco abuse, chronic HFpEF, chronic chest pain, sleep apnea, coronary  artery disease (acute inferior STEMI/ischemic 06/11/2024). Nutrition-pertinent labs include Cr 1.85, eGFR 45 on 07/25/2024. Patient reports motivation for healthy weight loss; recently started GLP-1. Voices need for healthier food choices; however reports lack of understanding for how to make heart healthy meals at home. RD provided suggestions for easy-to-prepare meals at home. Discussed importance of making small, attainable goals. Patient will benefit from participation in intensive cardiac rehab for nutrition education, exercise, and lifestyle modification.      Intervention Plan   Intervention Prescribe, educate and counsel regarding individualized specific dietary modifications aiming towards targeted core components such as weight, hypertension, lipid management, diabetes, heart failure and other comorbidities.;Nutrition handout(s) given to patient.   Handout: [employer]ikin Eating for a Healthy Heart   Expected Outcomes Short Term Goal: Understand basic principles of dietary content, such as calories, fat, sodium, cholesterol and nutrients.;Long Term Goal: Adherence to  prescribed nutrition plan.          Nutrition Assessments:  MEDIFICTS Score Key: >=70 Need to make dietary changes  40-70 Heart Healthy Diet <= 40 Therapeutic Level Cholesterol Diet    Picture Your Plate Scores: <59 Unhealthy dietary pattern with much room for improvement. 41-50 Dietary pattern unlikely to meet recommendations for good health and room for improvement. 51-60 More healthful dietary pattern, with some room for improvement.  >60 Healthy dietary pattern, although there may be some specific behaviors that could be improved.    Nutrition Goals Re-Evaluation:   Nutrition Goals Re-Evaluation:   Nutrition Goals Discharge (Final Nutrition Goals Re-Evaluation):   Psychosocial: Target Goals: Acknowledge presence or absence of significant depression and/or stress, maximize coping skills, provide positive support system. Participant is able to verbalize types and ability to use techniques and skills needed for reducing stress and depression.  Initial Review & Psychosocial Screening:  Initial Psych Review & Screening - 08/14/24 0947       Initial Review   Current issues with Current Sleep Concerns;Current Anxiety/Panic;Current Stress Concerns    Source of Stress Concerns Chronic Illness;Occupation;Unable to participate in former interests or hobbies;Unable to perform yard/household activities    Comments Calvin Bailey says he has had some anxiety and depression regarding his recent MI, Calvin Bailey says he has had diffculty sleeping at night since his hospitalization. Discussed sleep hygiene. Enourage to discuss with his PCP Dr Penne if his symptoms persist.      Family Dynamics   Good Support System? Yes   Calvin Bailey has his brothers for support. Calvin Bailey has 4 children aged 9-22.     Barriers   Psychosocial barriers to participate in program The patient should benefit from training in stress management and relaxation.      Screening Interventions   Interventions Encouraged to  exercise;Provide feedback about the scores to participant;To provide support and resources with identified psychosocial needs    Expected Outcomes Long Term Goal: Stressors or current issues are controlled or eliminated.;Short Term goal: Identification and review with participant of any Quality of Life or Depression concerns found by scoring the questionnaire.;Long Term goal: The participant improves quality of Life and PHQ9 Scores as seen by post scores and/or verbalization of changes          Quality of Life Scores:  Quality of Life - 08/14/24 1025       Quality of Life   Select Quality of Life      Quality of Life Scores   Health/Function Pre 15.82 %    Socioeconomic Pre 18.57 %    Psych/Spiritual Pre 17.07 %  Family Pre 22.8 %    GLOBAL Pre 17.73 %         Scores of 19 and below usually indicate a poorer quality of life in these areas.  A difference of  2-3 points is a clinically meaningful difference.  A difference of 2-3 points in the total score of the Quality of Life Index has been associated with significant improvement in overall quality of life, self-image, physical symptoms, and general health in studies assessing change in quality of life.  PHQ-9: Review Flowsheet  More data exists      08/14/2024 07/30/2024 02/24/2024 02/02/2024 03/10/2023  Depression screen PHQ 2/9  Decreased Interest 0 0 0 0 1  Down, Depressed, Hopeless 0 0 0 0 0  PHQ - 2 Score 0 0 0 0 1  Altered sleeping 3 0 0 0 1  Tired, decreased energy 1 0 0 0 1  Change in appetite 0 0 0 0 1  Feeling bad or failure about yourself  0 0 0 0 0  Trouble concentrating 1 0 0 0 0  Moving slowly or fidgety/restless 0 0 0 0 0  Suicidal thoughts 0 0 0 0 0  PHQ-9 Score 5 0  0  0  4   Difficult doing work/chores Somewhat difficult Not difficult at all Not difficult at all Not difficult at all Somewhat difficult    Details       Data saved with a previous flowsheet row definition        Interpretation of Total  Score  Total Score Depression Severity:  1-4 = Minimal depression, 5-9 = Mild depression, 10-14 = Moderate depression, 15-19 = Moderately severe depression, 20-27 = Severe depression   Psychosocial Evaluation and Intervention:   Psychosocial Re-Evaluation:  Psychosocial Re-Evaluation     Row Name 08/20/24 1442 08/27/24 1720           Psychosocial Re-Evaluation   Current issues with Current Anxiety/Panic;Current Sleep Concerns;Current Stress Concerns Current Anxiety/Panic;Current Sleep Concerns;Current Stress Concerns      Comments Calvin Bailey says he has difficulty sleeping at night discussed sleep hygiene. Calvin Bailey says he has experienced low energy since his MI in September.  Calvin Bailey did not voice any increased concerns or stressors on his first day of exercise. Reviewed PHQ9.Calvin Bailey says he has changed his cardiology follow up to [facility] in [place] this better meets his needs. Hudson denies being depressed. Calvin Bailey has not had any chest pain. Calvin Bailey says that participating in cardiac rehab has been helpful for him.  Calvin Bailey says he feels better since he has lost weight.      Expected Outcomes Calvin Bailey will have contolled or decreased anxiety/ stressors upon completion of cardiac rehab. Calvin Bailey will have contolled or decreased anxiety/ stressors upon completion of cardiac rehab.      Interventions Stress management education;Relaxation education;Encouraged to attend Cardiac Rehabilitation for the exercise Stress management education;Relaxation education;Encouraged to attend Cardiac Rehabilitation for the exercise      Continue Psychosocial Services  Follow up required by staff Follow up required by staff        Initial Review   Source of Stress Concerns Chronic Illness;Financial;Retirement/disability Chronic Illness;Financial;Retirement/disability      Comments Will continue to monitor and offer support as needed. Will continue to monitor and offer support as needed.         Psychosocial Discharge  (Final Psychosocial Re-Evaluation):  Psychosocial Re-Evaluation - 08/27/24 1720       Psychosocial Re-Evaluation   Current issues with Current Anxiety/Panic;Current Sleep  Concerns;Current Stress Concerns    Comments Reviewed PHQ9.Calvin Bailey says he has changed his cardiology follow up to [facility] in [place] this better meets his needs. Calvin Bailey denies being depressed. Calvin Bailey has not had any chest pain. Calvin Bailey says that participating in cardiac rehab has been helpful for him.  Calvin Bailey says he feels better since he has lost weight.    Expected Outcomes Calvin Bailey will have contolled or decreased anxiety/ stressors upon completion of cardiac rehab.    Interventions Stress management education;Relaxation education;Encouraged to attend Cardiac Rehabilitation for the exercise    Continue Psychosocial Services  Follow up required by staff      Initial Review   Source of Stress Concerns Chronic Illness;Financial;Retirement/disability    Comments Will continue to monitor and offer support as needed.          Vocational Rehabilitation: Provide vocational rehab assistance to qualifying candidates.   Vocational Rehab Evaluation & Intervention:  Vocational Rehab - 08/14/24 0952       Initial Vocational Rehab Evaluation & Intervention   Assessment shows need for Vocational Rehabilitation No   Calvin Bailey has been out of work due to [employer] since may. Calvin Bailey does not need vocational rehab for job retraining at this time.         Education: Education Goals: Education classes will be provided on a weekly basis, covering required topics. Participant will state understanding/return demonstration of topics presented.    Education     Row Name 08/20/24 0800     Education   Cardiac Education Topics [facility]in   Glass Blower/designer Nutrition   Nutrition Workshop Targeting Your Nutrition Priorities   Instruction Review Code 1- Verbalizes Understanding   Class  Start Time 0815   Class Stop Time 0901   Class Time Calculation (min) 46 min    Row Name 08/22/24 0800     Education   Cardiac Education Topics [facility]   Secondary School Teacher School   Educator Dietitian   Weekly Topic One-Pot Wonders   Instruction Review Code 1- Verbalizes Understanding   Class Start Time [id number]   Class Stop Time 0858   Class Time Calculation (min) 44 min    Row Name 08/27/24 0800     Education   Cardiac Education Topics [facility]in   Select Workshops     Workshops   Educator Exercise Physiologist   Select Psychosocial   Psychosocial Workshop Focused Goals, Sustainable Changes   Instruction Review Code 1- Verbalizes Understanding   Class Start Time 0815   Class Stop Time 0850   Class Time Calculation (min) 35 min      Core Videos: Exercise    Move It!  Clinical staff conducted group or individual video education with verbal and written material and guidebook.  Patient learns the recommended Pritikin exercise program. Exercise with the goal of living a long, healthy life. Some of the health benefits of exercise include controlled diabetes, healthier blood pressure levels, improved cholesterol levels, improved heart and lung capacity, improved sleep, and better body composition. Everyone should speak with their doctor before starting or changing an exercise routine.  Biomechanical Limitations Clinical staff conducted group or individual video education with verbal and written material and guidebook.  Patient learns how biomechanical limitations can impact exercise and how we can mitigate and possibly overcome limitations to have an impactful and balanced exercise routine.  Body Composition Clinical staff conducted group or individual video  education with verbal and written material and guidebook.  Patient learns that body composition (ratio of muscle mass to fat mass) is a key component to assessing overall fitness, rather than body weight alone.  Increased fat mass, especially visceral belly fat, can put us  at increased risk for metabolic syndrome, type 2 diabetes, heart disease, and even death. It is recommended to combine diet and exercise (cardiovascular and resistance training) to improve your body composition. Seek guidance from your physician and exercise physiologist before implementing an exercise routine.  Exercise Action Plan Clinical staff conducted group or individual video education with verbal and written material and guidebook.  Patient learns the recommended strategies to achieve and enjoy long-term exercise adherence, including variety, self-motivation, self-efficacy, and positive decision making. Benefits of exercise include fitness, good health, weight management, more energy, better sleep, less stress, and overall well-being.  Medical   Heart Disease Risk Reduction Clinical staff conducted group or individual video education with verbal and written material and guidebook.  Patient learns our heart is our most vital organ as it circulates oxygen, nutrients, white blood cells, and hormones throughout the entire body, and carries waste away. Data supports a plant-based eating plan like the Pritikin Program for its effectiveness in slowing progression of and reversing heart disease. The video provides a number of recommendations to address heart disease.   Metabolic Syndrome and Belly Fat  Clinical staff conducted group or individual video education with verbal and written material and guidebook.  Patient learns what metabolic syndrome is, how it leads to heart disease, and how one can reverse it and keep it from coming back. You have metabolic syndrome if you have 3 of the following 5 criteria: abdominal obesity, high blood pressure, high triglycerides, low HDL cholesterol, and high blood sugar.  Hypertension and Heart Disease Clinical staff conducted group or individual video education with verbal and written material and  guidebook.  Patient learns that high blood pressure, or hypertension, is very common in the [place] . Hypertension is largely due to excessive salt intake, but other important risk factors include being overweight, physical inactivity, drinking too much alcohol, smoking, and not eating enough potassium from fruits and vegetables. High blood pressure is a leading risk factor for heart attack, stroke, congestive heart failure, dementia, kidney failure, and premature death. Long-term effects of excessive salt intake include stiffening of the arteries and thickening of heart muscle and organ damage. Recommendations include ways to reduce hypertension and the risk of heart disease.  Diseases of Our Time - Focusing on Diabetes Clinical staff conducted group or individual video education with verbal and written material and guidebook.  Patient learns why the best way to stop diseases of our time is prevention, through food and other lifestyle changes. Medicine (such as prescription pills and surgeries) is often only a Band-Aid on the problem, not a long-term solution. Most common diseases of our time include obesity, type 2 diabetes, hypertension, heart disease, and cancer. The Pritikin Program is recommended and has been proven to help reduce, reverse, and/or prevent the damaging effects of metabolic syndrome.  Nutrition   Overview of the Pritikin Eating Plan  Clinical staff conducted group or individual video education with verbal and written material and guidebook.  Patient learns about the Pritikin Eating Plan for disease risk reduction. The Pritikin Eating Plan emphasizes a wide variety of unrefined, minimally-processed carbohydrates, like fruits, vegetables, whole grains, and legumes. Go, Caution, and Stop food choices are explained. Plant-based and lean animal proteins are emphasized. Rationale  provided for low sodium intake for blood pressure control, low added sugars for blood sugar stabilization,  and low added fats and oils for coronary artery disease risk reduction and weight management.  Calorie Density  Clinical staff conducted group or individual video education with verbal and written material and guidebook.  Patient learns about calorie density and how it impacts the Pritikin Eating Plan. Knowing the characteristics of the food you choose will help you decide whether those foods will lead to weight gain or weight loss, and whether you want to consume more or less of them. Weight loss is usually a side effect of the Pritikin Eating Plan because of its focus on low calorie-dense foods.  Label Reading  Clinical staff conducted group or individual video education with verbal and written material and guidebook.  Patient learns about the Pritikin recommended label reading guidelines and corresponding recommendations regarding calorie density, added sugars, sodium content, and whole grains.  Dining Out - Part 1  Clinical staff conducted group or individual video education with verbal and written material and guidebook.  Patient learns that restaurant meals can be sabotaging because they can be so high in calories, fat, sodium, and/or sugar. Patient learns recommended strategies on how to positively address this and avoid unhealthy pitfalls.  Facts on Fats  Clinical staff conducted group or individual video education with verbal and written material and guidebook.  Patient learns that lifestyle modifications can be just as effective, if not more so, as many medications for lowering your risk of heart disease. A Pritikin lifestyle can help to reduce your risk of inflammation and atherosclerosis (cholesterol build-up, or plaque, in the artery walls). Lifestyle interventions such as dietary choices and physical activity address the cause of atherosclerosis. A review of the types of fats and their impact on blood cholesterol levels, along with dietary recommendations to reduce fat intake is also  included.  Nutrition Action Plan  Clinical staff conducted group or individual video education with verbal and written material and guidebook.  Patient learns how to incorporate Pritikin recommendations into their lifestyle. Recommendations include planning and keeping personal health goals in mind as an important part of their success.  Healthy Mind-Set    Healthy Minds, Bodies, Hearts  Clinical staff conducted group or individual video education with verbal and written material and guidebook.  Patient learns how to identify when they are stressed. Video will discuss the impact of that stress, as well as the many benefits of stress management. Patient will also be introduced to stress management techniques. The way we think, act, and feel has an impact on our hearts.  How Our Thoughts Can Heal Our Hearts  Clinical staff conducted group or individual video education with verbal and written material and guidebook.  Patient learns that negative thoughts can cause depression and anxiety. This can result in negative lifestyle behavior and serious health problems. Cognitive behavioral therapy is an effective method to help control our thoughts in order to change and improve our emotional outlook.  Additional Videos:  Exercise    Improving Performance  Clinical staff conducted group or individual video education with verbal and written material and guidebook.  Patient learns to use a non-linear approach by alternating intensity levels and lengths of time spent exercising to help burn more calories and lose more body fat. Cardiovascular exercise helps improve heart health, metabolism, hormonal balance, blood sugar control, and recovery from fatigue. Resistance training improves strength, endurance, balance, coordination, reaction time, metabolism, and muscle mass. Flexibility exercise improves  circulation, posture, and balance. Seek guidance from your physician and exercise physiologist before  implementing an exercise routine and learn your capabilities and proper form for all exercise.  Introduction to Yoga  Clinical staff conducted group or individual video education with verbal and written material and guidebook.  Patient learns about yoga, a discipline of the coming together of mind, breath, and body. The benefits of yoga include improved flexibility, improved range of motion, better posture and core strength, increased lung function, weight loss, and positive self-image. Yoga's heart health benefits include lowered blood pressure, healthier heart rate, decreased cholesterol and triglyceride levels, improved immune function, and reduced stress. Seek guidance from your physician and exercise physiologist before implementing an exercise routine and learn your capabilities and proper form for all exercise.  Medical   Aging: Enhancing Your Quality of Life  Clinical staff conducted group or individual video education with verbal and written material and guidebook.  Patient learns key strategies and recommendations to stay in good physical health and enhance quality of life, such as prevention strategies, having an advocate, securing a Health Care Proxy and Power of Attorney, and keeping a list of medications and system for tracking them. It also discusses how to avoid risk for bone loss.  Biology of Weight Control  Clinical staff conducted group or individual video education with verbal and written material and guidebook.  Patient learns that weight gain occurs because we consume more calories than we burn (eating more, moving less). Even if your body weight is normal, you may have higher ratios of fat compared to muscle mass. Too much body fat puts you at increased risk for cardiovascular disease, heart attack, stroke, type 2 diabetes, and obesity-related cancers. In addition to exercise, following the Pritikin Eating Plan can help reduce your risk.  Decoding Lab Results  Clinical staff  conducted group or individual video education with verbal and written material and guidebook.  Patient learns that lab test reflects one measurement whose values change over time and are influenced by many factors, including medication, stress, sleep, exercise, food, hydration, pre-existing medical conditions, and more. It is recommended to use the knowledge from this video to become more involved with your lab results and evaluate your numbers to speak with your doctor.   Diseases of Our Time - Overview  Clinical staff conducted group or individual video education with verbal and written material and guidebook.  Patient learns that according to the CDC, 50% to 70% of chronic diseases (such as obesity, type 2 diabetes, elevated lipids, hypertension, and heart disease) are avoidable through lifestyle improvements including healthier food choices, listening to satiety cues, and increased physical activity.  Sleep Disorders Clinical staff conducted group or individual video education with verbal and written material and guidebook.  Patient learns how good quality and duration of sleep are important to overall health and well-being. Patient also learns about sleep disorders and how they impact health along with recommendations to address them, including discussing with a physician.  Nutrition  Dining Out - Part 2 Clinical staff conducted group or individual video education with verbal and written material and guidebook.  Patient learns how to plan ahead and communicate in order to maximize their dining experience in a healthy and nutritious manner. Included are recommended food choices based on the type of restaurant the patient is visiting.   Fueling a Banker conducted group or individual video education with verbal and written material and guidebook.  There is a strong connection between  our food choices and our health. Diseases like obesity and type 2 diabetes are very  prevalent and are in large-part due to lifestyle choices. The Pritikin Eating Plan provides plenty of food and hunger-curbing satisfaction. It is easy to follow, affordable, and helps reduce health risks.  Menu Workshop  Clinical staff conducted group or individual video education with verbal and written material and guidebook.  Patient learns that restaurant meals can sabotage health goals because they are often packed with calories, fat, sodium, and sugar. Recommendations include strategies to plan ahead and to communicate with the manager, chef, or server to help order a healthier meal.  Planning Your Eating Strategy  Clinical staff conducted group or individual video education with verbal and written material and guidebook.  Patient learns about the Pritikin Eating Plan and its benefit of reducing the risk of disease. The Pritikin Eating Plan does not focus on calories. Instead, it emphasizes high-quality, nutrient-rich foods. By knowing the characteristics of the foods, we choose, we can determine their calorie density and make informed decisions.  Targeting Your Nutrition Priorities  Clinical staff conducted group or individual video education with verbal and written material and guidebook.  Patient learns that lifestyle habits have a tremendous impact on disease risk and progression. This video provides eating and physical activity recommendations based on your personal health goals, such as reducing LDL cholesterol, losing weight, preventing or controlling type 2 diabetes, and reducing high blood pressure.  Vitamins and Minerals  Clinical staff conducted group or individual video education with verbal and written material and guidebook.  Patient learns different ways to obtain key vitamins and minerals, including through a recommended healthy diet. It is important to discuss all supplements you take with your doctor.   Healthy Mind-Set    Smoking Cessation  Clinical staff conducted group  or individual video education with verbal and written material and guidebook.  Patient learns that cigarette smoking and tobacco addiction pose a serious health risk which affects millions of people. Stopping smoking will significantly reduce the risk of heart disease, lung disease, and many forms of cancer. Recommended strategies for quitting are covered, including working with your doctor to develop a successful plan.  Culinary   Becoming a Set Designer conducted group or individual video education with verbal and written material and guidebook.  Patient learns that cooking at home can be healthy, cost-effective, quick, and puts them in control. Keys to cooking healthy recipes will include looking at your recipe, assessing your equipment needs, planning ahead, making it simple, choosing cost-effective seasonal ingredients, and limiting the use of added fats, salts, and sugars.  Cooking - Breakfast and Snacks  Clinical staff conducted group or individual video education with verbal and written material and guidebook.  Patient learns how important breakfast is to satiety and nutrition through the entire day. Recommendations include key foods to eat during breakfast to help stabilize blood sugar levels and to prevent overeating at meals later in the day. Planning ahead is also a key component.  Cooking - Educational Psychologist conducted group or individual video education with verbal and written material and guidebook.  Patient learns eating strategies to improve overall health, including an approach to cook more at home. Recommendations include thinking of animal protein as a side on your plate rather than center stage and focusing instead on lower calorie dense options like vegetables, fruits, whole grains, and plant-based proteins, such as beans. Making sauces in large quantities to freeze for  later and leaving the skin on your vegetables are also recommended to maximize  your experience.  Cooking - Healthy Salads and Dressing Clinical staff conducted group or individual video education with verbal and written material and guidebook.  Patient learns that vegetables, fruits, whole grains, and legumes are the foundations of the Pritikin Eating Plan. Recommendations include how to incorporate each of these in flavorful and healthy salads, and how to create homemade salad dressings. Proper handling of ingredients is also covered. Cooking - Soups and [employer] - Soups and Desserts Clinical staff conducted group or individual video education with verbal and written material and guidebook.  Patient learns that Pritikin soups and desserts make for easy, nutritious, and delicious snacks and meal components that are low in sodium, fat, sugar, and calorie density, while high in vitamins, minerals, and filling fiber. Recommendations include simple and healthy ideas for soups and desserts.   Overview     The Pritikin Solution Program Overview Clinical staff conducted group or individual video education with verbal and written material and guidebook.  Patient learns that the results of the Pritikin Program have been documented in more than 100 articles published in peer-reviewed journals, and the benefits include reducing risk factors for (and, in some cases, even reversing) high cholesterol, high blood pressure, type 2 diabetes, obesity, and more! An overview of the three key pillars of the Pritikin Program will be covered: eating well, doing regular exercise, and having a healthy mind-set.  WORKSHOPS  Exercise: Exercise Basics: Building Your Action Plan Clinical staff led group instruction and group discussion with PowerPoint presentation and patient guidebook. To enhance the learning environment the use of posters, models and videos may be added. At the conclusion of this workshop, patients will comprehend the difference between physical activity and exercise, as well  as the benefits of incorporating both, into their routine. Patients will understand the FITT (Frequency, Intensity, Time, and Type) principle and how to use it to build an exercise action plan. In addition, safety concerns and other considerations for exercise and cardiac rehab will be addressed by the presenter. The purpose of this lesson is to promote a comprehensive and effective weekly exercise routine in order to improve patients' overall level of fitness.   Managing Heart Disease: Your Path to a Healthier Heart Clinical staff led group instruction and group discussion with PowerPoint presentation and patient guidebook. To enhance the learning environment the use of posters, models and videos may be added.At the conclusion of this workshop, patients will understand the anatomy and physiology of the heart. Additionally, they will understand how Pritikin's three pillars impact the risk factors, the progression, and the management of heart disease.  The purpose of this lesson is to provide a high-level overview of the heart, heart disease, and how the Pritikin lifestyle positively impacts risk factors.  Exercise Biomechanics Clinical staff led group instruction and group discussion with PowerPoint presentation and patient guidebook. To enhance the learning environment the use of posters, models and videos may be added. Patients will learn how the structural parts of their bodies function and how these functions impact their daily activities, movement, and exercise. Patients will learn how to promote a neutral spine, learn how to manage pain, and identify ways to improve their physical movement in order to promote healthy living. The purpose of this lesson is to expose patients to common physical limitations that impact physical activity. Participants will learn practical ways to adapt and manage aches and pains, and to minimize their  effect on regular exercise. Patients will learn how to  maintain good posture while sitting, walking, and lifting.  Balance Training and Fall Prevention  Clinical staff led group instruction and group discussion with PowerPoint presentation and patient guidebook. To enhance the learning environment the use of posters, models and videos may be added. At the conclusion of this workshop, patients will understand the importance of their sensorimotor skills (vision, proprioception, and the vestibular system) in maintaining their ability to balance as they age. Patients will apply a variety of balancing exercises that are appropriate for their current level of function. Patients will understand the common causes for poor balance, possible solutions to these problems, and ways to modify their physical environment in order to minimize their fall risk. The purpose of this lesson is to teach patients about the importance of maintaining balance as they age and ways to minimize their risk of falling.  WORKSHOPS   Nutrition:  Fueling a Ship Broker led group instruction and group discussion with PowerPoint presentation and patient guidebook. To enhance the learning environment the use of posters, models and videos may be added. Patients will review the foundational principles of the Pritikin Eating Plan and understand what constitutes a serving size in each of the food groups. Patients will also learn Pritikin-friendly foods that are better choices when away from home and review make-ahead meal and snack options. Calorie density will be reviewed and applied to three nutrition priorities: weight maintenance, weight loss, and weight gain. The purpose of this lesson is to reinforce (in a group setting) the key concepts around what patients are recommended to eat and how to apply these guidelines when away from home by planning and selecting Pritikin-friendly options. Patients will understand how calorie density may be adjusted for different weight management  goals.  Mindful Eating  Clinical staff led group instruction and group discussion with PowerPoint presentation and patient guidebook. To enhance the learning environment the use of posters, models and videos may be added. Patients will briefly review the concepts of the Pritikin Eating Plan and the importance of low-calorie dense foods. The concept of mindful eating will be introduced as well as the importance of paying attention to internal hunger signals. Triggers for non-hunger eating and techniques for dealing with triggers will be explored. The purpose of this lesson is to provide patients with the opportunity to review the basic principles of the Pritikin Eating Plan, discuss the value of eating mindfully and how to measure internal cues of hunger and fullness using the Hunger Scale. Patients will also discuss reasons for non-hunger eating and learn strategies to use for controlling emotional eating.  Targeting Your Nutrition Priorities Clinical staff led group instruction and group discussion with PowerPoint presentation and patient guidebook. To enhance the learning environment the use of posters, models and videos may be added. Patients will learn how to determine their genetic susceptibility to disease by reviewing their family history. Patients will gain insight into the importance of diet as part of an overall healthy lifestyle in mitigating the impact of genetics and other environmental insults. The purpose of this lesson is to provide patients with the opportunity to assess their personal nutrition priorities by looking at their family history, their own health history and current risk factors. Patients will also be able to discuss ways of prioritizing and modifying the Pritikin Eating Plan for their highest risk areas  Menu  Clinical staff led group instruction and group discussion with PowerPoint presentation and patient guidebook. To  enhance the learning environment the use of posters,  models and videos may be added. Using menus brought in from [employer], or printed from [employer], patients will apply the Pritikin dining out guidelines that were presented in the [employer] video. Patients will also be able to practice these guidelines in a variety of provided scenarios. The purpose of this lesson is to provide patients with the opportunity to practice hands-on learning of the [facility]in Dining Out guidelines with actual menus and practice scenarios.  Label Reading Clinical staff led group instruction and group discussion with PowerPoint presentation and patient guidebook. To enhance the learning environment the use of posters, models and videos may be added. Patients will review and discuss the Pritikin label reading guidelines presented in [employer]'s Label Reading Educational series video. Using fool labels brought in from local grocery stores and markets, patients will apply the label reading guidelines and determine if the packaged food meet the Pritikin guidelines. The purpose of this lesson is to provide patients with the opportunity to review, discuss, and practice hands-on learning of the Pritikin Label Reading guidelines with actual packaged food labels. Cooking School  [employer]'s [employer] are designed to teach patients ways to prepare quick, simple, and affordable recipes at home. The importance of nutrition's role in chronic disease risk reduction is reflected in its emphasis in the overall [facility]in program. By learning how to prepare essential core Pritikin Eating Plan recipes, patients will increase control over what they eat; be able to customize the flavor of foods without the use of added salt, sugar, or fat; and improve the quality of the food they consume. By learning a set of core recipes which are easily assembled, quickly prepared, and affordable, patients are more likely to prepare more healthy foods at home. These workshops  focus on convenient breakfasts, simple entres, side dishes, and desserts which can be prepared with minimal effort and are consistent with nutrition recommendations for cardiovascular risk reduction. Cooking [employer] are taught by a armed forces logistics/support/administrative officer (RD) who has been trained by the [employer]. The chef or RD has a clear understanding of the importance of minimizing - if not completely eliminating - added fat, sugar, and sodium in recipes. Throughout the series of Cooking School Workshop sessions, patients will learn about healthy ingredients and efficient methods of cooking to build confidence in their capability to prepare    Cooking School weekly topics:  Adding Flavor- Sodium-Free  Fast and Healthy Breakfasts  Powerhouse Plant-Based Proteins  Satisfying Salads and Dressings  Simple Sides and Sauces  International Cuisine-Spotlight on the [employer] Zones  Delicious Desserts  Savory Soups  [employer] - Meals in a Astronomer Appetizers and Snacks  Comforting Weekend Breakfasts  One-Pot Wonders   Fast Evening Meals  Landscape Architect Your Pritikin Plate  WORKSHOPS   Healthy Mindset (Psychosocial):  Focused Goals, Sustainable Changes Clinical staff led group instruction and group discussion with PowerPoint presentation and patient guidebook. To enhance the learning environment the use of posters, models and videos may be added. Patients will be able to apply effective goal setting strategies to establish at least one personal goal, and then take consistent, meaningful action toward that goal. They will learn to identify common barriers to achieving personal goals and develop strategies to overcome them. Patients will also gain an understanding of how our mind-set can impact our ability to achieve goals and the importance of cultivating a positive and growth-oriented  mind-set. The purpose of this lesson is to provide patients with a deeper  understanding of how to set and achieve personal goals, as well as the tools and strategies needed to overcome common obstacles which may arise along the way.  From Head to Heart: The Power of a Healthy Outlook  Clinical staff led group instruction and group discussion with PowerPoint presentation and patient guidebook. To enhance the learning environment the use of posters, models and videos may be added. Patients will be able to recognize and describe the impact of emotions and mood on physical health. They will discover the importance of self-care and explore self-care practices which may work for them. Patients will also learn how to utilize the 4 C's to cultivate a healthier outlook and better manage stress and challenges. The purpose of this lesson is to demonstrate to patients how a healthy outlook is an essential part of maintaining good health, especially as they continue their cardiac rehab journey.  Healthy Sleep for a Healthy Heart Clinical staff led group instruction and group discussion with PowerPoint presentation and patient guidebook. To enhance the learning environment the use of posters, models and videos may be added. At the conclusion of this workshop, patients will be able to demonstrate knowledge of the importance of sleep to overall health, well-being, and quality of life. They will understand the symptoms of, and treatments for, common sleep disorders. Patients will also be able to identify daytime and nighttime behaviors which impact sleep, and they will be able to apply these tools to help manage sleep-related challenges. The purpose of this lesson is to provide patients with a general overview of sleep and outline the importance of quality sleep. Patients will learn about a few of the most common sleep disorders. Patients will also be introduced to the concept of "sleep hygiene," and discover ways to self-manage certain sleeping problems through simple daily behavior changes.  Finally, the workshop will motivate patients by clarifying the links between quality sleep and their goals of heart-healthy living.   Recognizing and Reducing Stress Clinical staff led group instruction and group discussion with PowerPoint presentation and patient guidebook. To enhance the learning environment the use of posters, models and videos may be added. At the conclusion of this workshop, patients will be able to understand the types of stress reactions, differentiate between acute and chronic stress, and recognize the impact that chronic stress has on their health. They will also be able to apply different coping mechanisms, such as reframing negative self-talk. Patients will have the opportunity to practice a variety of stress management techniques, such as deep abdominal breathing, progressive muscle relaxation, and/or guided imagery.  The purpose of this lesson is to educate patients on the role of stress in their lives and to provide healthy techniques for coping with it.  Learning Barriers/Preferences:  Learning Barriers/Preferences - 08/14/24 0953       Learning Barriers/Preferences   Learning Barriers Exercise Concerns   Calvin Bailey has a herniated disk and has chronic lower back pain and right arm pain.   Learning Preferences Skilled Demonstration;Audio;Computer/Internet;Group Instruction;Video;Pictoral;Individual Instruction;Verbal Instruction          Education Topics:  Knowledge Questionnaire Score:  Knowledge Questionnaire Score - 08/14/24 1025       Knowledge Questionnaire Score   Pre Score 23/28          Core Components/Risk Factors/Patient Goals at Admission:  Personal Goals and Risk Factors at Admission - 08/14/24 0805       Core Components/Risk  Factors/Patient Goals on Admission    Weight Management Yes;Obesity;Weight Loss    Intervention Weight Management/Obesity: Establish reasonable short term and long term weight goals.;Obesity: Provide education and  appropriate resources to help participant work on and attain dietary goals.    Admit Weight 358 lb 1.6 oz (162.4 kg)    Goal Weight: Short Term 345 lb (156.5 kg)    Goal Weight: Long Term 275 lb (124.7 kg)    Expected Outcomes Short Term: Continue to assess and modify interventions until short term weight is achieved;Long Term: Adherence to nutrition and physical activity/exercise program aimed toward attainment of established weight goal;Weight Loss: Understanding of general recommendations for a balanced deficit meal plan, which promotes 1-2 lb weight loss per week and includes a negative energy balance of [phone] kcal/d    Tobacco Cessation Yes    Number of packs per day 0.1 ppd    Intervention Assist the participant in steps to quit. Provide individualized education and counseling about committing to Tobacco Cessation, relapse prevention, and pharmacological support that can be provided by physician.;Education officer, environmental, assist with locating and accessing local/national Quit Smoking programs, and support quit date choice.    Expected Outcomes Short Term: Will demonstrate readiness to quit, by selecting a quit date.;Long Term: Complete abstinence from all tobacco products for at least 12 months from quit date.;Short Term: Will quit all tobacco product use, adhering to prevention of relapse plan.    Hypertension Yes    Intervention Provide education on lifestyle modifcations including regular physical activity/exercise, weight management, moderate sodium restriction and increased consumption of fresh fruit, vegetables, and low fat dairy, alcohol moderation, and smoking cessation.;Monitor prescription use compliance.    Expected Outcomes Short Term: Continued assessment and intervention until BP is < 140/67mm HG in hypertensive participants. < 130/97mm HG in hypertensive participants with diabetes, heart failure or chronic kidney disease.;Long Term: Maintenance of blood pressure at goal  levels.    Stress Yes    Intervention Offer individual and/or small group education and counseling on adjustment to heart disease, stress management and health-related lifestyle change. Teach and support self-help strategies.;Refer participants experiencing significant psychosocial distress to appropriate mental health specialists for further evaluation and treatment. When possible, include family members and significant others in education/counseling sessions.    Expected Outcomes Short Term: Participant demonstrates changes in health-related behavior, relaxation and other stress management skills, ability to obtain effective social support, and compliance with psychotropic medications if prescribed.;Long Term: Emotional wellbeing is indicated by absence of clinically significant psychosocial distress or social isolation.          Core Components/Risk Factors/Patient Goals Review:   Goals and Risk Factor Review     Row Name 08/20/24 1448 08/27/24 1721           Core Components/Risk Factors/Patient Goals Review   Personal Goals Review Weight Management/Obesity;Lipids;Hypertension;Tobacco Cessation Weight Management/Obesity;Lipids;Hypertension;Tobacco Cessation      Review Calvin Bailey started cardiac rehab on 08/20/24. Calvin Bailey did well with exercise. Vital signs were stable. Calvin Bailey started cardiac rehab on 08/20/24. Calvin Bailey is off to a good start with exercise. Vital signs have been stable. Calvin Bailey has lost 4.2 kg since starting cardiac rehab.      Expected Outcomes Calvin Bailey will continue to participate in cardiac rehab for exercise, nutrition and lifestyle modifications Finnian will continue to participate in cardiac rehab for exercise, nutrition and lifestyle modifications         Core Components/Risk Factors/Patient Goals at Discharge (Final Review):   Goals and Risk Factor  Review - 08/27/24 1721       Core Components/Risk Factors/Patient Goals Review   Personal Goals Review Weight  Management/Obesity;Lipids;Hypertension;Tobacco Cessation    Review Tauno started cardiac rehab on 08/20/24. Aleksey is off to a good start with exercise. Vital signs have been stable. Justice has lost 4.2 kg since starting cardiac rehab.    Expected Outcomes Dequann will continue to participate in cardiac rehab for exercise, nutrition and lifestyle modifications          ITP Comments:  ITP Comments     Row Name 08/14/24 0757 08/20/24 1440 08/27/24 1714       ITP Comments Medical Director- Dr. Wilbert Bihari, MD. Introduction to the [facility] Education Program/ Intensive Cardiac Rehab. Reviewed initial orientation folder with Jerona. 30 Day ITP Review. Sylvanus started cardiac rehab on 08/20/24. Naszir did well with exercise. 30 Day ITP Review. Jamesrobert started cardiac rehab on 08/20/24. Gaven is off to a good start to exercise.        Comments: See ITP comments.[name] RN BSN

## 2024-08-29 ENCOUNTER — Encounter (HOSPITAL_COMMUNITY)

## 2024-08-30 ENCOUNTER — Encounter: Payer: Self-pay | Admitting: Cardiology

## 2024-08-30 ENCOUNTER — Ambulatory Visit: Attending: Cardiology | Admitting: Cardiology

## 2024-08-30 ENCOUNTER — Other Ambulatory Visit: Payer: Self-pay

## 2024-08-30 ENCOUNTER — Other Ambulatory Visit (HOSPITAL_BASED_OUTPATIENT_CLINIC_OR_DEPARTMENT_OTHER): Payer: Self-pay

## 2024-08-30 VITALS — BP 124/72 | HR 84 | Ht 73.0 in | Wt 346.4 lb

## 2024-08-30 DIAGNOSIS — I2510 Atherosclerotic heart disease of native coronary artery without angina pectoris: Secondary | ICD-10-CM | POA: Diagnosis not present

## 2024-08-30 DIAGNOSIS — Z01810 Encounter for preprocedural cardiovascular examination: Secondary | ICD-10-CM

## 2024-08-30 DIAGNOSIS — I2542 Coronary artery dissection: Secondary | ICD-10-CM

## 2024-08-30 DIAGNOSIS — R002 Palpitations: Secondary | ICD-10-CM

## 2024-08-30 DIAGNOSIS — I5032 Chronic diastolic (congestive) heart failure: Secondary | ICD-10-CM

## 2024-08-30 DIAGNOSIS — I517 Cardiomegaly: Secondary | ICD-10-CM

## 2024-08-30 DIAGNOSIS — G4733 Obstructive sleep apnea (adult) (pediatric): Secondary | ICD-10-CM

## 2024-08-30 DIAGNOSIS — N1832 Chronic kidney disease, stage 3b: Secondary | ICD-10-CM

## 2024-08-30 DIAGNOSIS — I10 Essential (primary) hypertension: Secondary | ICD-10-CM

## 2024-08-30 DIAGNOSIS — F17200 Nicotine dependence, unspecified, uncomplicated: Secondary | ICD-10-CM

## 2024-08-30 MED ORDER — METOPROLOL SUCCINATE ER 25 MG PO TB24
25.0000 mg | ORAL_TABLET | Freq: Every day | ORAL | 3 refills | Status: DC
  Filled 2024-08-30: qty 30, 30d supply, fill #0
  Filled 2024-10-05: qty 30, 30d supply, fill #1

## 2024-08-30 NOTE — Patient Instructions (Signed)
 Medication Instructions:  Your physician recommends the following medication changes.  STOP TAKING: Labetalol   START TAKING: Toprol XL 25 mg daily  *If you need a refill on your cardiac medications before your next appointment, please call your pharmacy*  Lab Work: CBC If you have labs (blood work) drawn today and your tests are completely normal, you will receive your results only by: MyChart Message (if you have MyChart) OR A paper copy in the mail If you have any lab test that is abnormal or we need to change your treatment, we will call you to review the results.  Testing/Procedures:   You are scheduled for Cardiac MRI at the location below.   ?  [facility] [address] [place], [place] [address] Please take advantage of the free valet parking available at the [facility] and [employer] (Entrance C).  Proceed to the [facility] Radiology Department (First Floor) for check-in.   OR   [facility] [address] [place], [place] [address] Please go to the [facility] and check-in with the desk attendant.   Magnetic resonance imaging (MRI) is a painless test that produces images of the inside of the body without using Xrays.  During an MRI, strong magnets and radio waves work together in a data processing manager to form detailed images.   MRI images may provide more details about a medical condition than X-rays, CT scans, and ultrasounds can provide.  You may be given earphones to listen for instructions.  You may eat a light breakfast and take medications as ordered with the exception of furosemide , hydrochlorothiazide , chlorthalidone  or spironolactone  (or any other fluid pill). If you are undergoing a stress MRI, please avoid stimulants for 12 hr prior to test. (I.e. Caffeine, nicotine , chocolate, or antihistamine medications)  If your provider has ordered anti-anxiety medications for this test, then you will need a driver.  An  IV will be inserted into one of your veins. Contrast material will be injected into your IV. It will leave your body through your urine within a day. You may be told to drink plenty of fluids to help flush the contrast material out of your system.  You will be asked to remove all metal, including: Watch, jewelry, and other metal objects including hearing aids, hair pieces and dentures. Also wearable glucose monitoring systems (ie. Freestyle Libre and Omnipods) (Braces and fillings normally are not a problem.)   TEST WILL TAKE APPROXIMATELY 1 HOUR  PLEASE NOTIFY SCHEDULING AT LEAST 24 HOURS IN ADVANCE IF YOU ARE UNABLE TO KEEP YOUR APPOINTMENT. [phone]  For more information and frequently asked questions, please visit our website : [email or web address]  Please call the Cardiac Imaging Nurse Navigators with any questions/concerns. [phone] Office    Follow-Up: At [facility], you and your health needs are our priority.  As part of our continuing mission to provide you with exceptional heart care, our providers are all part of one team.  This team includes your primary Cardiologist (physician) and Advanced Practice Providers or APPs (Physician Assistants and Nurse Practitioners) who all work together to provide you with the care you need, when you need it.  Your next appointment:   3 month(s)  Provider:   Tylene Lunch, NP

## 2024-08-30 NOTE — Progress Notes (Unsigned)
 Cardiology Office Note   Date:  08/31/2024  ID:  KILE KABLER, DOB [birth year], MRN [id number] PCP: [place], Quintin PARAS, MD  [facility] Providers Cardiologist:  Dub Huntsman, DO Cardiology APP:  Gerard Frederick, NP     History of Present Illness Calvin Bailey is a 43 y.o. male with a past medical history of hypertension, hyperlipidemia, OSA, CKD, superobesity, tobacco abuse, chronic HFpEF, chronic chest pain, sleep apnea, coronary artery disease (acute inferior STEMI 06/11/2024 SCAD), who presented today for follow-up.   Diagnosed with hypertension in his 44s.  Has been difficult to control previous difficulties with compliance.  01/2022 CT angio of the head and neck with no significant carotid or vertebral stenosis.  Previously been followed with hypertensive clinic as well as seen the Pharm.D. team.  Presented to [facility] in February 2024 with complaints of chest pain and hypertensive urgency.  Was feeling persistent chest pain in the right side of his chest while he was at work.  Chest pain had been related to hypertensive urgency.  Had chest pain 11/13/2022 with no PE on CT of the chest.  Blood pressure was 172/29.  Serum creatinine 1.61 with BUN of 29 and GFR 54, high-sensitivity troponin 12, EKG without ischemic changes.  Started on Imdur  and echocardiogram was ordered.  And revealed an LVEF of 60-65%, normal LV function, no RWMA, mild concentric LVH, normal RV systolic function, mild aortic valve regurgitation, no significant change from prior study.  It was recommended he have a coronary CTA as an outpatient.  Evaluated in urgent care 01/25/23 with complaints of a headache for 2 days.  Concerns if it was in the right temple area was swollen.  He had surgery upcoming soon for renal mass and is worried about a blood clot or cancer that had spread.  Blood pressure was much improved to 147/83.  He was treated with Tylenol  for his headache.  He was subsequently discharged.  He  was scheduled for Lexiscan  Myoview  to complete his ischemic workup from previous hospitalization.  Coronary CTA was deferred due to elevated serum creatinine.  Lexiscan  Myoview  was considered low risk, negative for ischemia or infarction, EF 55 to 65%.  He was last seen in clinic 12/20/2023 at that time he was planning on participating in a digital smoking cessation program  He was evaluated in the ED on 12/19/2023 in the setting of shortness of breath.  He was noted to have mild bilateral lower extremity edema.  BNP was normal.  EKG was without acute changes, troponin was negative, D-dimer was negative.  He was treated with nebulizer and Solu-Medrol  with improvement in his symptoms and was discharged home in stable condition.  There were no medication changes that were made or further testing that was ordered at that time.  Multiple emergency department visits for varying complaints of chest and abdominal pain. Work-ups were unrevealing and he was discharged home.  He was seen in clinic 04/04/2024 with continued shortness of breath, weight gain, dyspnea exertion, peripheral edema.  He had follow-up with pulmonary and nephrology.  He had recent been evaluated emergency department the day before with complaints of chest pain with negative troponins.  He was scheduled for today Lexiscan  to rule out ischemic causes.  He was evaluated in the emergency department at [facility] on 04/28/2019 with complaints of shortness of breath abdominal pain chest pain.  He complained of shortness of breath and chest tightness for approximately month.  He was in the ED with  no obvious distress and normal breathing pattern.  Workup was unrevealing and he was discharged home with furosemide  20 mg daily.  He was seen in clinic 05/09/2024 continued to have shortness of breath and atypical chest discomfort.  Recently undergone Lexiscan  Myoview  concerning for ischemia was scheduled for right and left heart catheterization.  Amlodipine  was  increased to 5 mg twice daily for suboptimally controlled blood pressure.  He was started on SGLT2 inhibitor therapy was scheduled for an updated echocardiogram.  He was last seen in clinic 06/01/2024 send he was doing well from a cardiac perspective.  Denied any recurrent chest pain.  Continue to complain of shortness of breath.  Stated he followed back with pulmonary and was advised it was likely of an issue with his lungs.  He continued to stay compliant with his medications.  He had several questions today related to weight loss and was curious if he qualified for Ozempic  or Zepbound  therapy.  He was hospitalized at [facility] from 9/15 - 06/13/2024 for ST elevation myocardial infarction involving the left anterior descending. He was taken emergently to the Cath Lab on 05/11/2554 and was found to have diffuse tapering of the mid and distal LAD with angiographic appearance of diffuse intramural hematoma and apical dissection. Widely patent RCA, left main, proximal LAD, and left circumflex/intermittent branches with no significant stenosis, normal LVEDP. After review of his films in comparison to his old study the patient may have both coronary vasospasm and currently with spontaneous coronary artery dissection. Today stenting the apical LAD was involved and since the LAD wraps the apex and supplies the distal inferior wall if the etiology of his inferior ST elevation was medical therapy. Echocardiogram revealed an LVEF of 60 to 65% left ventricle was normal function. He was aspirin  was stopped due to hematoma and dissection. He was continued on labetalol  300 mg twice daily, spironolactone  25 mg daily, valsartan  60 mg daily, amlodipine  10 mg daily, BiDil  2 tablets 3 times daily, clonidine  0.1 mg 3 times daily started on nicotine  patches and was considered stable for discharge.   He was seen in clinic 06/23/2024 at that time was doing okay and denied any further chest pain.  Had started on Zepbound ,  reported compliance with CPAP, and was recommended to hold off on cardiac rehab until his blood pressure was better controlled.  Valsartan  was increased to 320 mg daily and he was sent for updated labs to ensure stable kidney function.  He was last seen in clinic 07/12/2024 for and at that time was overall doing well from a cardiac perspective.  He denied any further chest pain but continued to have some exertional shortness of breath.  Stated he had been compliant with his current medication regimen.  He had complaints of palpitations and was placed on a ZIO XT monitor to rule out any arrhythmia.   He returns to clinic today stating over lately he has been doing fairly well.  Since starting on GLP-1's he has lost almost 50 pounds.  His breathing has improved.  He denies any recurrent chest discomfort.  He does have questions today about his blood pressure being on the lower side.  He states that they has also discussed his blood pressure being slightly lower with the cardiac rehab staff.  He has enjoyed cardiac rehab and is doing well with it.  He unfortunately has been held off his labetalol  for the last 3 days.  The remainder of his medication he has been compliant with  without any undue side effects.  He denies any recent hospitalizations or visits to the emergency department.  ROS: 10 point review of systems has been reviewed and considered negative the exception was been listed in the HPI  Studies Reviewed     2d echo 06/12/2024  1. Left ventricular ejection fraction, by estimation, is 60 to 65%. The  left ventricle has normal function. The left ventricle has no regional  wall motion abnormalities. There is mild left ventricular hypertrophy.  Left ventricular diastolic parameters  were normal.   2. Right ventricular systolic function is normal. The right ventricular  size is normal.   3. The mitral valve is normal in structure. No evidence of mitral valve  regurgitation. No evidence of mitral  stenosis.   4. The aortic valve is normal in structure. Aortic valve regurgitation is  not visualized. No aortic stenosis is present.   5. The inferior vena cava is normal in size with greater than 50%  respiratory variability, suggesting right atrial pressure of 3 mmHg.   LHC 06/11/2024 1.  Diffuse tapering of the mid and distal LAD with angiographic appearance of diffuse intramural hematoma and apical dissection 2.  Widely patent RCA, left main, proximal LAD, and left circumflex/intermediate branches with no significant stenoses 3.  Normal LVEDP   After review of his films in comparison to his old study, the patient may have both coronary vasospasm and currently with spontaneous coronary artery dissection.  On today's study, the apical LAD is involved and since the LAD wraps the apex and supplies the distal inferior wall I think this is the etiology of his inferior ST elevation. Plan medical therapy.   2D echo 05/24/2024 1. Left ventricular ejection fraction, by estimation, is 60 to 65%. The  left ventricle has normal function. Left ventricular endocardial border  not optimally defined to evaluate regional wall motion. The left  ventricular internal cavity size was mildly  dilated. Left ventricular diastolic parameters are consistent with Grade  II diastolic dysfunction (pseudonormalization). The average left  ventricular global longitudinal strain is -18.3 %. The global longitudinal  strain is normal.   2. Right ventricular systolic function is normal. The right ventricular  size is moderately enlarged. Tricuspid regurgitation signal is inadequate  for assessing PA pressure.   3. Left atrial size was moderately dilated.   4. The mitral valve is normal in structure. No evidence of mitral valve  regurgitation. No evidence of mitral stenosis.   5. The aortic valve is tricuspid. Aortic valve regurgitation is trivial.  Aortic valve sclerosis/calcification is present, without any evidence of   aortic stenosis. Aortic valve area, by VTI measures 4.06 cm. Aortic valve  mean gradient measures 9.8 mmHg.   Aortic valve Vmax measures 2.12 m/s.   6. The inferior vena cava is normal in size with greater than 50%  respiratory variability, suggesting right atrial pressure of 3 mmHg.    R/LHC 05/18/2024 Conclusions: 70% ostial/proximal RCA stenosis that improves to 30% with intracoronary nitroglycerin  suggesting a significant component of vasospasm.  Otherwise, no angiographically significant coronary artery disease. Upper normal to mildly elevated left heart filling pressures (PCWP 17, LVEDP 18 mmHg). Moderately elevated right heart filling pressures (mean RA 13, RV 43/14 mmHg). Mild pulmonary hypertension (PA 40/19, mean 26 mmHg). Normal Fick cardiac output/index (CO 8.2 L/min, CI 2.8 L/min/m). Moderate aortic valve/LVOT peak to peak gradient (24 mmHg).   Recommendations: Continue medical therapy and risk factor modification to prevent progression of CAD.  Continue isosorbide  mononitrate  and amlodipine  for treatment of possible component of coronary vasospasm. Proceed with echocardiogram for further assessment of possible LVOT/aortic valve gradient.  If echo is inconclusive, cardiac MRI may be helpful. Maintain net even to slightly negative fluid balance. Avoid nephrotoxic agents, including NSAIDs.   Lexiscan  MPI 05/04/2024 Pharmacological myocardial perfusion imaging study with small region of ischemia in the basal to mid inferolateral wall and mid to distal inferior and inferoapical wall Unable to definitively exclude diaphragmatic attenuation artifact as attenuation correction images not available Normal wall motion, EF estimated at 56% No EKG changes concerning for ischemia at peak stress or in recovery. Moderate risk scan   Lexiscan  MPI 01/17/2023   The study is normal. The study is low risk.   No ST deviation was noted.   LV perfusion is normal. There is no evidence of  ischemia. There is no evidence of infarction.   Left ventricular function is normal. The left ventricular ejection fraction is normal (55-65%). End diastolic cavity size is normal.   This was supposed to be a 2-day study.  However, stress images are normal and does not need for rest imaging   TTE 11/20/22 1. Left ventricular ejection fraction, by estimation, is 60 to 65%. The  left ventricle has normal function. The left ventricle has no regional  wall motion abnormalities. There is mild concentric left ventricular  hypertrophy. Left ventricular diastolic  parameters were normal.   2. Right ventricular systolic function is normal. The right ventricular  size is normal. There is normal pulmonary artery systolic pressure.   3. The mitral valve is normal in structure. Trivial mitral valve  regurgitation. No evidence of mitral stenosis.   4. The aortic valve is tricuspid. Aortic valve regurgitation is mild. No  aortic stenosis is present.   5. The inferior vena cava is normal in size with greater than 50%  respiratory variability, suggesting right atrial pressure of 3 mmHg.   Risk Assessment/Calculations           Physical Exam VS:  BP 124/72 (BP Location: Left Arm, Patient Position: Sitting, Cuff Size: Large)   Pulse 84   Ht 6' 1 (1.854 m)   Wt (!) 346 lb 6.4 oz (157.1 kg)   SpO2 96%   BMI 45.70 kg/m        Wt Readings from Last 3 Encounters:  08/30/24 (!) 346 lb 6.4 oz (157.1 kg)  08/14/24 (!) 358 lb 0.4 oz (162.4 kg)  07/30/24 (!) 368 lb (166.9 kg)    GEN: Well nourished, well developed in no acute distress NECK: No JVD; No carotid bruits CARDIAC: RRR, no murmurs, rubs, gallops RESPIRATORY:  Clear to auscultation without rales, wheezing or rhonchi  ABDOMEN: Soft, non-tender, non-distended EXTREMITIES:  No edema; No deformity   ASSESSMENT AND PLAN Coronary artery disease with recent STEMI/scad 06/11/2024, cardiac catheterization with diffuse tapering of the mid/distal LAD  with appearance of vertebral hematoma and dissection.  Recommendation was continued medical therapy aspirin  was stopped.  Today he continues to deny any chest discomfort.  States that he has been doing well at cardiac rehab to increase his stamina and getting more active.   Hypertension with a blood pressure today 120/72.  Blood pressure has been well-controlled since increasing his activity and with substantial weight loss.  He has been continued on amlodipine  10 mg daily, clonidine  0.2 mg 3 times daily, furosemide  20 mg daily as needed for edema, BiDil  20/37.5 mg 2 tablets 3 times daily spironolactone  25 mg daily, valsartan  60  mg twice daily and recently he has been out of his labetalol  for several days and started on Toprol -XL 25 mg daily.  He has been encouraged to continue to monitor his pressures 1 to 2 hours postmedication administration as well.  Chronic HFpEF with chronic shortness of breath that is improving.  It is difficult to evaluate his fluid status due to body habitus.  Recent echocardiogram completed 06/12/2024 indicated an LVEF of 60 to 65%, no RWMA, mild LVH, RV systolic function and size were normal with no evidence of valvular abnormalities.  Previously he was to be scheduled for cardiac MRI due to mild concentric left ventricular hypertrophy with dilated internal cavity size and a G2 DD.  After starting to feel better and his recent hospitalization he is scheduled for his cardiac MRI.  He will undergo an H&H prior to his procedure.  He is continued on GDMT of Toprol -XL 25 mg daily, spironolactone  25 mg daily, and Jardiance  10 mg daily.   Palpitations where he was previously placed on a ZIO XT monitor that he has just returned.  We are waiting to determine if he is having any arrhythmias.  CKD stage III with last serum creatinine of 1.85 which is a slight increase from his baseline.  His baseline is around 1.6.  He is encouraged to maintain adequate hydration within his fluid restriction  and to avoid NSAIDs.  He continues to follow with nephrology.  Tobacco abuse.  Has started smoking again.  Total cessation continues to be recommended.  OSA on CPAP where he reports adherence.  Morbid obesity with a BMI 45.78.  He is continued on GLP-1 for weight loss and has been congratulated on his weight loss thus far as he has lost he says approximately almost 50 pounds.  He continues to have fairly good progress.  Preprocedure cardiovascular examination    Mr. Kemmerling perioperative risk of a major cardiac event is 0.9% according to the Revised Cardiac Risk Index (RCRI).  Therefore, he is at low risk for perioperative complications.   His functional capacity is fair at 6.27 METs according to the Duke Activity Status Index (DASI). Recommendations: According to ACC/AHA guidelines, no further cardiovascular testing needed.  The patient may proceed to surgery at acceptable risk.  Patient recently had STEMI with scad in the hospital back in September.  He was advised this he would have to wait a minimum of 3 months after his event prior to have a surgical procedure.        Dispo: Patient return to clinic to see MD/APP in 3 months or sooner if needed.  He was advised that if his cardiac MRI results is abnormal his follow-up appointment will be moved up and he will be seen sooner.  Signed, Sherhonda Gaspar, NP

## 2024-08-31 ENCOUNTER — Encounter: Payer: Self-pay | Admitting: Cardiology

## 2024-08-31 ENCOUNTER — Ambulatory Visit: Payer: Self-pay | Admitting: Cardiology

## 2024-08-31 ENCOUNTER — Ambulatory Visit: Admitting: Cardiology

## 2024-08-31 LAB — HEMOGLOBIN AND HEMATOCRIT, BLOOD
Hematocrit: 43 % (ref 37.5–51.0)
Hemoglobin: 13.6 g/dL (ref 13.0–17.7)

## 2024-08-31 NOTE — Progress Notes (Signed)
 Hemoglobin and hematocrit remain stable

## 2024-08-31 NOTE — Progress Notes (Signed)
 Efaxed office note to surgeon  Surgeon:  DR. ALM MOLT Surgeon's Group or Practice Name:  [facility] Phone number:  [phone] Fax number:  [phone] EXT [id number] [facility]

## 2024-09-03 ENCOUNTER — Encounter (HOSPITAL_COMMUNITY)

## 2024-09-04 ENCOUNTER — Ambulatory Visit: Admitting: Internal Medicine

## 2024-09-04 ENCOUNTER — Encounter: Payer: Self-pay | Admitting: Internal Medicine

## 2024-09-04 VITALS — BP 140/80 | HR 81 | Temp 98.4°F | Ht 73.0 in | Wt 346.0 lb

## 2024-09-04 DIAGNOSIS — R0981 Nasal congestion: Secondary | ICD-10-CM

## 2024-09-04 DIAGNOSIS — G4733 Obstructive sleep apnea (adult) (pediatric): Secondary | ICD-10-CM

## 2024-09-04 DIAGNOSIS — R911 Solitary pulmonary nodule: Secondary | ICD-10-CM

## 2024-09-04 NOTE — Progress Notes (Unsigned)
 [facility] [place] [facility] Consultation      Date: 09/04/2024,   MRN# [id number] Calvin Bailey [birth year]    SYNOPSIS Follow-up assessment for shortness of breath January 2025 diagnosed with influenza A Patient had urgent care visit was given prednisone  and antibiotics  February 2025 admitted for shortness of breath has CKD stage III found to have hyperkalemia-electrolytes managed properly patient symptoms resolved  May 2025-admitted for shortness of breath in the ER patient self medicated himself with Lasix  and symptoms improved  Patient also has a diagnosis of kidney cancer which is being taken care of by interventional radiology undergoing cryotherapy  Patient continues to smoke Patient being evaluated for bariatric surgery currently weighs 348 pounds Previous 392  July 2025 pulmonary function testing Pulmonary function testing postbronchodilator FEV1 FVC ratio 75% predicted FEV1 is 59% predicted FVC is 56% predicted TLC 82% predicted RV 86% predicted DLCO 105% predicted No significant bronchodilator response Flow-volume loops suggestive of restrictive and obstructive pattern Findings suggest underlying obstructive lung disease and restrictive lung disease most likely from morbid obesity   CHIEF COMPLAINT:   Follow-up assessment for tobacco abuse Follow-up assessment for COPD Follow-up assessment for abnormal CT chest   HISTORY OF PRESENT ILLNESS   Follow-up assessment for shortness of breath Symptoms are stable at this time no significant respiratory issues No significant infections at this time He says breathing is okay Does not use inhalers No exacerbation at this time No evidence of heart failure at this time No evidence or signs of infection at this time No respiratory distress No fevers, chills, nausea, vomiting, diarrhea No evidence of lower extremity edema No evidence hemoptysis    CT chest  OCT 2025 Left lower lobe lung nodule  subcentimeter in nature 2 to 3 mm    Recommend repeat CT chest July 2026 this is when he gets his assessment of his kidney cancer by radiologist   CPAP download reviewed in detail AHI reduced to 5 Previous AHI of 90 Excellent compliance report   PAST MEDICAL HISTORY   Past Medical History:  Diagnosis Date   Arthritis    Cancer (HCC)    right kidney   CHF (congestive heart failure) (HCC)    pt. 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 INVASIVE CV LAB;  Service: Cardiovascular;  Laterality: N/A;   RADIOLOGY WITH ANESTHESIA Right 02/16/2023   Procedure: RIGHT RENAL MASS CRYO ABLATION;  Surgeon: Philip Cornet, MD;  Location: [facility] ORS;  Service: Anesthesiology;  Laterality: Right;   RIGHT/LEFT HEART CATH AND CORONARY ANGIOGRAPHY Bilateral 05/18/2024   Procedure: RIGHT/LEFT HEART CATH AND CORONARY ANGIOGRAPHY;  Surgeon: Mady Bruckner, MD;  Location: [facility]  INVASIVE CV LAB;  Service: Cardiovascular;  Laterality: Bilateral;   two knee surgeries Left 09/27/1993     FAMILY HISTORY   Family History  Problem Relation Age of Onset   Hypertension Mother    Multiple  sclerosis Mother    Hypertension Father    Hypertension Brother    Hyperlipidemia Paternal Grandfather    Heart failure Paternal Grandfather    Diabetes Maternal Grandmother    Heart failure Maternal Grandfather    CVA Maternal Uncle      SOCIAL HISTORY   Social History   Tobacco Use   Smoking status: Every Day    Current packs/day: 0.25    Average packs/day: 0.1 packs/day for 18.2 years (1.9 ttl pk-yrs)    Types: Cigarettes    Start date: 06/08/2006    Passive exposure: Current   Smokeless tobacco: Never   Tobacco comments:    02/01/24 patient smokes 3-6 cigarettes daily  Vaping Use   Vaping status: Never Used  Substance Use Topics   Alcohol use: Not Currently    Comment: sparingly    Drug use: No     MEDICATIONS    Home Medication:  Current Outpatient Rx   Order #: [id number] Class: Print   Order #: [id number] Class: Normal   Order #: [id number] Class: Normal   Order #: [id number] Class: Historical Med   Order #: [id number] Class: Normal   Order #: [id number] Class: Normal   Order #: [id number] Class: No Print   Order #: [id number] Class: Normal   Order #: [id number] Class: Normal   Order #: [id number] Class: Normal   Order #: [id number] Class: Normal   Order #: [id number] Class: Normal   Order #: [id number] Class: Normal   Order #: [id number] Class: Normal   Order #: [id number] Class: Historical Med   Order #: [id number] Class: Normal    Current Medication:  Current Outpatient Medications:    acetaminophen  (TYLENOL ) 500 MG tablet, Take 1 tablet (500 mg total) by mouth every 6 (six) hours as needed., Disp: 30 tablet, Rfl: 0   albuterol  (VENTOLIN  HFA) 108 (90 Base) MCG/ACT inhaler, Inhale 2 puffs into the lungs every 6 (six) hours as needed for wheezing or shortness of breath., Disp: 8 g, Rfl: 10   amLODipine  (NORVASC ) 10 MG tablet, Take 1 tablet (10 mg total) by mouth daily., Disp: 90 tablet, Rfl: 3   chlorthalidone  (HYGROTON ) 25 MG tablet, Take 12.5 mg by mouth daily., Disp: , Rfl:     cloNIDine  (CATAPRES ) 0.1 MG tablet, Take 1 tablet (0.1 mg total) by mouth 3 (three) times daily., Disp: 90 tablet, Rfl: 0   empagliflozin  (JARDIANCE ) 10 MG TABS tablet, Take 1 tablet (10 mg total) by mouth daily before breakfast., Disp: 30 tablet, Rfl: 11   furosemide  (LASIX ) 20 MG tablet, Take 1 tablet (20 mg total) by mouth daily as needed for fluid or edema., Disp: , Rfl:    isosorbide -hydrALAZINE  (BIDIL ) 20-37.5 MG tablet, Take 2 tablets by mouth 3 (three) times daily., Disp: 180 tablet, Rfl: 2   metoprolol  succinate (TOPROL  XL) 25 MG 24 hr tablet, Take 1 tablet (25 mg total) by mouth daily., Disp: 90 tablet, Rfl: 3   nicotine  (NICODERM CQ  - DOSED IN MG/24 HOURS) 14 mg/24hr patch, Place 1 patch (14 mg total) onto the skin daily., Disp: 28 patch, Rfl: 0   nitroGLYCERIN  (NITROSTAT ) 0.4 MG SL tablet, Place 1 tablet (0.4 mg total) under the tongue every 5 (five) minutes as needed for chest pain., Disp: 25 tablet, Rfl: PRN   spironolactone  (ALDACTONE ) 25 MG tablet, Take 1 tablet (  25 mg total) by mouth once for 1 dose. (Patient taking differently: Take 25 mg by mouth daily.), Disp: 90 tablet, Rfl: 0   tirzepatide  (ZEPBOUND ) 5 MG/0.5ML Pen, Inject 5 mg into the skin once a week., Disp: 2 mL, Rfl: 0   tirzepatide  (ZEPBOUND ) 7.5 MG/0.5ML Pen, Inject 7.5 mg into the skin once a week., Disp: 2 mL, Rfl: 1   tiZANidine (ZANAFLEX) 4 MG tablet, Take 4 mg by mouth every 8 (eight) hours as needed., Disp: , Rfl:    valsartan  (DIOVAN ) 160 MG tablet, Take 1 tablet (160 mg total) by mouth 2 (two) times daily., Disp: 180 tablet, Rfl: 3    ALLERGIES   Hydrochlorothiazide  and Lisinopril   BP (!) 140/80   Pulse 81   Temp 98.4 F (36.9 C)   Ht 6' 1 (1.854 m)   Wt (!) 346 lb (156.9 kg)   SpO2 96%   BMI 45.65 kg/m    Physical Examination:  General Appearance: No distress  EYES EOM intact.   NECK Supple, No JVD Pulmonary: normal breath sounds, No wheezing.  CardiovascularNormal S1,S2.  No m/r/g.   Ext  pulses intact, cap refill intact  ALL OTHER ROS ARE NEGATIVE      ASSESSMENT/PLAN   43 year old pleasant African-American male seen today for ongoing shortness of breath likely related to underlying reactive airways disease and COPD due to extensive smoking history in the setting of morbid obesity and deconditioned state with underlying signs and symptoms of chronic kidney disease stage III with diastolic dysfunction in the setting of diagnosis of kidney carcinoma, underlying diagnosis of OSA  Chronic obstructive pulmonary disease, unspecified COPD type (HCC) FEV1 59% predicted Continue to use albuterol  as needed No maintenance therapy at this time  Assessment of OSA Previous AHI 90 per patient Continue CPAP as prescribed  Excellent compliance report Reviewed compliance report in detail with patient Patient definitely benefits the use of CPAP therapy as prescribed Using CPAP nightly and with naps Pressure setting is comfortable and is sleeping well. AHI reduced to 5.2  No evidence of acute heart failure at this time No respiratory distress No fevers, chills, nausea, vomiting, diarrhea No evidence hemoptysis  Patient Instructions Continue to use CPAP every night, minimum of 4-6 hours a night.  Change equipment every 30 days or as directed by DME.  Wash your tubing with warm soap and water  daily, hang to dry.  Wash humidifier portion weekly. Use bottled, distilled water  and change daily  Risk of untreated sleep apnea including cardiac arrhthymias, stroke, DM, pulm HTN.    Tobacco abuse Smoking Assessment and Cessation Counseling   Morbid obesity (HCC) Obesity -recommend significant weight loss -recommend changing diet Continue Zepbound  currently through 348 pounds previous weight was 392  Deconditioned state -Recommend increased daily activity and exercise  Lung nodule Recommend follow up CT chest July 2026   Cardiac dysfunction Follow-up cardiology Lasix  as  prescribed  Kidney carcinoma Follow-up interventional radiology cryosurgery ablation therapy performed Follow-up CT scans per radiology  Follow-up bariatric surgery Follow-up evaluation   MEDICATION ADJUSTMENTS/LABS AND TESTS ORDERED: Recommend obtaining CT of the chest in July 2026 Can use albuterol  as needed Follow-up with cardiology Follow-up with nephrology Follow-up with Dr. Philip interventional radiology for kidney cancer Continue CPAP Follow-up with bariatric surgery evaluation   Patient  satisfied with Plan of action and management. All questions answered   Follow up 1 year   MEDICATION ADJUSTMENTS/LABS AND TESTS ORDERED:    CURRENT MEDICATIONS REVIEWED AT LENGTH WITH PATIENT TODAY  Patient  satisfied with Plan of action and management. All questions answered   Follow up 1 year   I spent a total of 42 minutes dedicated to the care of this patient on the date of this encounter to include pre-visit review of records, face-to-face time with the patient discussing conditions above, post visit ordering of testing, clinical documentation with the electronic health record, making appropriate referrals as documented, and communicating necessary information to the patient's healthcare team.    The Patient requires high complexity decision making for assessment and support, frequent evaluation and titration of therapies, application of advanced monitoring technologies and extensive interpretation of multiple databases.  Patient satisfied with Plan of action and management. All questions answered    Nickolas Alm Cellar, M.D.  [facility]monary [facility] Critical [facility] Medicine  Medical Director [facility]

## 2024-09-04 NOTE — Patient Instructions (Signed)
 Plan for repeat CT chest in July 2026  Excellent Job A+ GOLD STAR!!  Continue CPAP as prescribed  Patient Instructions Continue to use CPAP every night, minimum of 4-6 hours a night.  Change equipment every 30 days or as directed by DME.  Wash your tubing with warm soap and water  daily, hang to dry. Wash humidifier portion weekly. Use bottled, distilled water  and change daily   Be aware of reduced alertness and do not drive or operate heavy machinery if experiencing this or drowsiness.  Exercise encouraged, as tolerated. Encouraged proper weight management.  Important to get eight or more hours of sleep  Limiting the use of the computer and television before bedtime.  Decrease naps during the day, so night time sleep will become enhanced.  Limit caffeine, and sleep deprivation.    Avoid Allergens and Irritants Avoid secondhand smoke Avoid SICK contacts Recommend  Masking  when appropriate Recommend Keep up-to-date with vaccinations  Recommend ENT referral for sinus disease and congestion

## 2024-09-05 ENCOUNTER — Other Ambulatory Visit: Payer: Self-pay | Admitting: Neurological Surgery

## 2024-09-05 ENCOUNTER — Encounter (INDEPENDENT_AMBULATORY_CARE_PROVIDER_SITE_OTHER): Payer: Self-pay

## 2024-09-05 ENCOUNTER — Encounter (HOSPITAL_COMMUNITY)
Admission: RE | Admit: 2024-09-05 | Discharge: 2024-09-05 | Disposition: A | Discharge status: Home / Self Care | Source: Ambulatory Visit | Attending: Cardiology | Admitting: Cardiology

## 2024-09-05 DIAGNOSIS — I2102 ST elevation (STEMI) myocardial infarction involving left anterior descending coronary artery: Secondary | ICD-10-CM

## 2024-09-05 DIAGNOSIS — I2542 Coronary artery dissection: Secondary | ICD-10-CM

## 2024-09-07 ENCOUNTER — Encounter (HOSPITAL_COMMUNITY)

## 2024-09-10 ENCOUNTER — Encounter (HOSPITAL_COMMUNITY): Admission: RE | Admit: 2024-09-10

## 2024-09-12 ENCOUNTER — Encounter (HOSPITAL_COMMUNITY)

## 2024-09-12 ENCOUNTER — Telehealth (HOSPITAL_COMMUNITY): Payer: Self-pay

## 2024-09-12 NOTE — Telephone Encounter (Signed)
 Attempted to call patient regardng 3x no call, no shows in a row for 8:15 CR class- no answer, left message asking patient to call us  back.

## 2024-09-14 ENCOUNTER — Encounter (HOSPITAL_COMMUNITY): Admission: RE | Admit: 2024-09-14 | Source: Ambulatory Visit

## 2024-09-14 ENCOUNTER — Telehealth (HOSPITAL_COMMUNITY): Payer: Self-pay | Admitting: *Deleted

## 2024-09-14 NOTE — Telephone Encounter (Signed)
 Left message from cardiac rehab. Will discharge from the program if the patient does not return to exercise on Monday 09/17/24.[name] RN BSN

## 2024-09-17 ENCOUNTER — Other Ambulatory Visit: Payer: Self-pay | Admitting: Cardiology

## 2024-09-17 ENCOUNTER — Other Ambulatory Visit (HOSPITAL_BASED_OUTPATIENT_CLINIC_OR_DEPARTMENT_OTHER): Payer: Self-pay

## 2024-09-17 ENCOUNTER — Encounter (HOSPITAL_COMMUNITY)

## 2024-09-17 NOTE — Progress Notes (Signed)
 Discharge Progress Report  Patient Details  Name: Calvin Bailey MRN: [id number] Date of Birth: April 07, 1981 Referring Provider:   Flowsheet Row INTENSIVE CARDIAC REHAB ORIENT from 08/14/2024 in [facility]  Referring Provider Sheena Pugh, DO     Number of Visits: 10  Reason for Discharge:  Early Exit:  Back to work and non-attendance  Smoking History:  Tobacco Use History[1]  Diagnosis:  06/11/24 STEMI  06/11/24 SCAD involving mid to distal LAD  ADL [facility]:   Initial Exercise Prescription:  Initial Exercise Prescription - 08/14/24 1000       Date of Initial Exercise RX and Referring Provider   Date 08/14/24    Referring Provider Tobb, Kardie, DO    Expected Discharge Date 11/07/24      Recumbant Bike   Level 2    RPM 25    Watts 15    Minutes 15    METs 2      NuStep   Level 2    SPM 95    Minutes 15    METs 2.3      Prescription Details   Frequency (times per week) 3    Duration Progress to 30 minutes of continuous aerobic without signs/symptoms of physical distress      Intensity   THRR 40-80% of Max Heartrate 70-140    Ratings of Perceived Exertion 11-13    Perceived Dyspnea 0-4      Progression   Progression Continue to progress workloads to maintain intensity without signs/symptoms of physical distress.      Resistance Training   Training Prescription Yes    Weight 4 lbs    Reps 10-15          Discharge Exercise Prescription (Final Exercise Prescription Changes):  Exercise Prescription Changes - 09/05/24 1138       Response to Exercise   Blood Pressure (Admit) 108/74    Blood Pressure (Exercise) 114/60    Blood Pressure (Exit) 104/72    Heart Rate (Admit) 77 bpm    Heart Rate (Exercise) 105 bpm    Heart Rate (Exit) 82 bpm    Rating of Perceived Exertion (Exercise) 9    Symptoms None    Comments Reviewed METs    Duration Progress to 30 minutes of  aerobic without  signs/symptoms of physical distress    Intensity THRR unchanged      Progression   Progression Continue to progress workloads to maintain intensity without signs/symptoms of physical distress.    Average METs 2.35      Resistance Training   Weight No wts on Wednesdays      Interval Training   Interval Training No      Recumbant Bike   Level 3    RPM 58    Watts 22    Minutes 15    METs 2.2      NuStep   Level 5    SPM 95    Minutes 15    METs 2.5          Functional Capacity:  6 Minute Walk     Row Name 08/14/24 0925         6 Minute Walk   Phase Initial     Distance 1224 feet     Walk Time 6 minutes     # of Rest Breaks 0     MPH 2.32     METS 2.76  RPE 12     Perceived Dyspnea  1     VO2 Peak 9.66     Symptoms Yes (comment)     Comments Mild shortness of breath. Back pain, 3-4/ 10 on pain scale. Chronic due to herniated disc C6-C7.     Resting HR 72 bpm     Resting BP 122/78     Resting Oxygen Saturation  98 %     Exercise Oxygen Saturation  during 6 min walk 97 %     Max Ex. HR 91 bpm     Max Ex. BP 122/60     2 Minute Post BP 106/60        Psychological, QOL, Others - Outcomes: PHQ 2/9:    08/14/2024   10:12 AM 07/30/2024   10:00 AM 02/24/2024    8:12 AM 02/02/2024    2:08 PM 03/10/2023    1:44 PM  Depression screen PHQ 2/9  Decreased Interest 0 0 0 0 1  Down, Depressed, Hopeless 0 0 0 0 0  PHQ - 2 Score 0 0 0 0 1  Altered sleeping 3 0 0 0 1  Tired, decreased energy 1 0 0 0 1  Change in appetite 0 0 0 0 1  Feeling bad or failure about yourself  0 0 0 0 0  Trouble concentrating 1 0 0 0 0  Moving slowly or fidgety/restless 0 0 0 0 0  Suicidal thoughts 0 0 0 0 0  PHQ-9 Score 5 0  0  0  4   Difficult doing work/chores Somewhat difficult Not difficult at all Not difficult at all Not difficult at all Somewhat difficult     Data saved with a previous flowsheet row definition    Quality of Life:  Quality of Life - 08/14/24 1025        Quality of Life   Select Quality of Life      Quality of Life Scores   Health/Function Pre 15.82 %    Socioeconomic Pre 18.57 %    Psych/Spiritual Pre 17.07 %    Family Pre 22.8 %    GLOBAL Pre 17.73 %          Personal Goals: Goals established at orientation with interventions provided to work toward goal.  Personal Goals and Risk Factors at Admission - 08/14/24 0805       Core Components/Risk Factors/Patient Goals on Admission    Weight Management Yes;Obesity;Weight Loss    Intervention Weight Management/Obesity: Establish reasonable short term and long term weight goals.;Obesity: Provide education and appropriate resources to help participant work on and attain dietary goals.    Admit Weight 358 lb 1.6 oz (162.4 kg)    Goal Weight: Short Term 345 lb (156.5 kg)    Goal Weight: Long Term 275 lb (124.7 kg)    Expected Outcomes Short Term: Continue to assess and modify interventions until short term weight is achieved;Long Term: Adherence to nutrition and physical activity/exercise program aimed toward attainment of established weight goal;Weight Loss: Understanding of general recommendations for a balanced deficit meal plan, which promotes 1-2 lb weight loss per week and includes a negative energy balance of [phone] kcal/d    Tobacco Cessation Yes    Number of packs per day 0.1 ppd    Intervention Assist the participant in steps to quit. Provide individualized education and counseling about committing to Tobacco Cessation, relapse prevention, and pharmacological support that can be provided by physician.;Education officer, environmental, assist with locating and accessing  local/national Quit Smoking programs, and support quit date choice.    Expected Outcomes Short Term: Will demonstrate readiness to quit, by selecting a quit date.;Long Term: Complete abstinence from all tobacco products for at least 12 months from quit date.;Short Term: Will quit all tobacco product use, adhering to  prevention of relapse plan.    Hypertension Yes    Intervention Provide education on lifestyle modifcations including regular physical activity/exercise, weight management, moderate sodium restriction and increased consumption of fresh fruit, vegetables, and low fat dairy, alcohol moderation, and smoking cessation.;Monitor prescription use compliance.    Expected Outcomes Short Term: Continued assessment and intervention until BP is < 140/75mm HG in hypertensive participants. < 130/17mm HG in hypertensive participants with diabetes, heart failure or chronic kidney disease.;Long Term: Maintenance of blood pressure at goal levels.    Stress Yes    Intervention Offer individual and/or small group education and counseling on adjustment to heart disease, stress management and health-related lifestyle change. Teach and support self-help strategies.;Refer participants experiencing significant psychosocial distress to appropriate mental health specialists for further evaluation and treatment. When possible, include family members and significant others in education/counseling sessions.    Expected Outcomes Short Term: Participant demonstrates changes in health-related behavior, relaxation and other stress management skills, ability to obtain effective social support, and compliance with psychotropic medications if prescribed.;Long Term: Emotional wellbeing is indicated by absence of clinically significant psychosocial distress or social isolation.           Personal Goals Discharge:  Goals and Risk Factor Review     Row Name 08/20/24 1448 08/27/24 1721           Core Components/Risk Factors/Patient Goals Review   Personal Goals Review Weight Management/Obesity;Lipids;Hypertension;Tobacco Cessation Weight Management/Obesity;Lipids;Hypertension;Tobacco Cessation      Review Trusten started cardiac rehab on 08/20/24. Kentrail did well with exercise. Vital signs were stable. Kaiel started cardiac rehab on  08/20/24. Anthon is off to a good start with exercise. Vital signs have been stable. Beni has lost 4.2 kg since starting cardiac rehab.      Expected Outcomes Osie will continue to participate in cardiac rehab for exercise, nutrition and lifestyle modifications Aidden will continue to participate in cardiac rehab for exercise, nutrition and lifestyle modifications         Exercise Goals and Review:  Exercise Goals     Row Name 08/14/24 0803             Exercise Goals   Increase Physical Activity Yes       Intervention Provide advice, education, support and counseling about physical activity/exercise needs.;Develop an individualized exercise prescription for aerobic and resistive training based on initial evaluation findings, risk stratification, comorbidities and participant's personal goals.       Expected Outcomes Short Term: Attend rehab on a regular basis to increase amount of physical activity.;Long Term: Add in home exercise to make exercise part of routine and to increase amount of physical activity.;Long Term: Exercising regularly at least 3-5 days a week.       Increase Strength and Stamina Yes       Intervention Provide advice, education, support and counseling about physical activity/exercise needs.;Develop an individualized exercise prescription for aerobic and resistive training based on initial evaluation findings, risk stratification, comorbidities and participant's personal goals.       Expected Outcomes Short Term: Increase workloads from initial exercise prescription for resistance, speed, and METs.;Short Term: Perform resistance training exercises routinely during rehab and add in resistance training at  home;Long Term: Improve cardiorespiratory fitness, muscular endurance and strength as measured by increased METs and functional capacity ( )       Able to understand and use rate of perceived exertion (RPE) scale Yes       Intervention Provide education and explanation  on how to use RPE scale       Expected Outcomes Short Term: Able to use RPE daily in rehab to express subjective intensity level;Long Term:  Able to use RPE to guide intensity level when exercising independently       Knowledge and understanding of Target Heart Rate Range (THRR) Yes       Intervention Provide education and explanation of THRR including how the numbers were predicted and where they are located for reference       Expected Outcomes Short Term: Able to state/look up THRR;Long Term: Able to use THRR to govern intensity when exercising independently;Short Term: Able to use daily as guideline for intensity in rehab       Able to check pulse independently Yes       Intervention Provide education and demonstration on how to check pulse in carotid and radial arteries.;Review the importance of being able to check your own pulse for safety during independent exercise       Expected Outcomes Short Term: Able to explain why pulse checking is important during independent exercise;Long Term: Able to check pulse independently and accurately       Understanding of Exercise Prescription Yes       Intervention Provide education, explanation, and written materials on patient's individual exercise prescription       Expected Outcomes Short Term: Able to explain program exercise prescription;Long Term: Able to explain home exercise prescription to exercise independently          Exercise Goals Re-Evaluation:  Exercise Goals Re-Evaluation     Row Name 08/20/24 1432             Exercise Goal Re-Evaluation   Exercise Goals Review Increase Physical Activity;Able to understand and use Dyspnea scale;Increase Strength and Stamina;Knowledge and understanding of Target Heart Rate Range (THRR);Able to understand and use rate of perceived exertion (RPE) scale       Comments Pt's first day in the [facility] program. Pt understnads the exercise Rx, RPE scale and THRR.       Expected Outcomes Will continue to  monitor the patient and progress exercise workloads as tolerated.          Nutrition & Weight - Outcomes:  Pre Biometrics - 08/14/24 0757       Pre Biometrics   Waist Circumference 56.5 inches    Hip Circumference 60 inches    Waist to Hip Ratio 0.94 %    Triceps Skinfold 35 mm    % Body Fat 44.3 %    Grip Strength 14 kg    Flexibility --   Not performed. Herniated disc in back.   Single Leg Stand 17.12 seconds           Nutrition:  Nutrition Therapy & Goals - 08/20/24 0946       Nutrition Therapy   Diet Heart Healthy      Personal Nutrition Goals   Nutrition Goal Patient to identify strategies for reducing cardiovascular risk by attending the [employer] education and nutrition series weekly.    Personal Goal #2 Patient to improve diet quality by using the plate method as a guide for meal planning to include lean protein/plant protein, fruits, vegetables,  whole grains, nonfat dairy as part of a well-balanced diet.    Personal Goal #3 Patient to identify strategies for weight loss with goal of 0.5-2 # per week of weight loss.    Comments Patient with medical history significant for hypertension, hyperlipidemia, OSA, CKD, morbid obesity, tobacco abuse, chronic HFpEF, chronic chest pain, sleep apnea, coronary artery disease (acute inferior STEMI/ischemic 06/11/2024). Nutrition-pertinent labs include Cr 1.85, eGFR 45 on 07/25/2024. Patient reports motivation for healthy weight loss; recently started GLP-1. Voices need for healthier food choices; however reports lack of understanding for how to make heart healthy meals at home. RD provided suggestions for easy-to-prepare meals at home. Discussed importance of making small, attainable goals. Patient will benefit from participation in intensive cardiac rehab for nutrition education, exercise, and lifestyle modification.      Intervention Plan   Intervention Prescribe, educate and counsel regarding individualized specific dietary  modifications aiming towards targeted core components such as weight, hypertension, lipid management, diabetes, heart failure and other comorbidities.;Nutrition handout(s) given to patient.   Handout: [employer] Eating for a Healthy Heart   Expected Outcomes Short Term Goal: Understand basic principles of dietary content, such as calories, fat, sodium, cholesterol and nutrients.;Long Term Goal: Adherence to prescribed nutrition plan.          Nutrition Discharge:   Education Questionnaire Score:  Knowledge Questionnaire Score - 08/14/24 1025       Knowledge Questionnaire Score   Pre Score 23/28         Manuelito attended  10 exercise and education sessions between 08/14/24-09/05/24 . Pt  progressed nicely during his participation in rehab as evidenced by increased MET level. Amanuel was discharged due to nonattendance. Zacharie had said he was going back to work until he has his surgery.[name] RN BSN      [1]  Social History Tobacco Use  Smoking Status Every Day   Current packs/day: 0.25   Average packs/day: 0.1 packs/day for 18.3 years (1.9 ttl pk-yrs)   Types: Cigarettes   Start date: 06/08/2006   Passive exposure: Current  Smokeless Tobacco Never  Tobacco Comments   02/01/24 patient smokes 3-6 cigarettes daily   09/04/24 now smoke 2-3 cigs a day

## 2024-09-18 ENCOUNTER — Other Ambulatory Visit: Payer: Self-pay

## 2024-09-18 ENCOUNTER — Other Ambulatory Visit (HOSPITAL_BASED_OUTPATIENT_CLINIC_OR_DEPARTMENT_OTHER): Payer: Self-pay

## 2024-09-18 ENCOUNTER — Other Ambulatory Visit (HOSPITAL_COMMUNITY): Payer: Self-pay

## 2024-09-18 MED ORDER — CLONIDINE HCL 0.1 MG PO TABS
0.1000 mg | ORAL_TABLET | Freq: Three times a day (TID) | ORAL | 3 refills | Status: AC
  Filled 2024-09-18: qty 90, 30d supply, fill #0
  Filled 2024-10-19: qty 90, 30d supply, fill #1

## 2024-09-18 MED ORDER — ZEPBOUND 7.5 MG/0.5ML ~~LOC~~ SOAJ
7.5000 mg | SUBCUTANEOUS | 0 refills | Status: DC
  Filled 2024-09-18: qty 2, 28d supply, fill #0

## 2024-09-19 ENCOUNTER — Encounter (HOSPITAL_COMMUNITY)

## 2024-09-21 ENCOUNTER — Encounter (HOSPITAL_COMMUNITY)

## 2024-09-21 NOTE — Progress Notes (Addendum)
 Surgical Instructions   Your procedure is scheduled on January 5. Report to [facility] Main Entrance A at 5:30 A.M., then check in with the Admitting office. Any questions or running late day of surgery: call [phone]  Questions prior to your surgery date: call [phone], Monday-Friday, 8am-4pm. If you experience any cold or flu symptoms such as cough, fever, chills, shortness of breath, etc. between now and your scheduled surgery, please notify us  at the above number.     Remember:  Do not eat or drink anything after midnight the night before your surgery   Take these medicines the morning of surgery with A SIP OF WATER   amLODipine  (NORVASC )  cloNIDine  (CATAPRES )  metoprolol  succinate (TOPROL  XL)   May take these medicines IF NEEDED: acetaminophen  (TYLENOL )  albuterol  (VENTOLIN  HFA) -please bring inhaler to the hospital. tiZANidine (ZANAFLEX)  nitroGLYCERIN  (NITROSTAT ) if you have to take this medicine at anytime prior to surgery,call [phone] to notify surgical staff.   One week prior to surgery, STOP taking any Aspirin  (unless otherwise instructed by your surgeon) Aleve , Naproxen , Ibuprofen , Motrin , Advil , Goody's, BC's, all herbal medications, fish oil, and non-prescription vitamins.   HOLD  tirzepatide  (ZEPBOUND ) 7 DAYS PRIOR TO SURGERY.    tAKE YOUR LASE DOSE ON OR BEFORE 09/23/24.            Do NOT Smoke (Tobacco/Vaping) for 24 hours prior to your procedure.  If you use a CPAP at night, you may bring your mask/headgear for your overnight stay.   You will be asked to remove any contacts, glasses, piercing's, hearing aid's, dentures/partials prior to surgery. Please bring cases for these items if needed.    Patients discharged the day of surgery will not be allowed to drive home, and someone needs to stay with them for 24 hours.  SURGICAL WAITING ROOM VISITATION Patients may have no more than 2 support people in the waiting area - these visitors may rotate.    Pre-op nurse will coordinate an appropriate time for 1 ADULT support person, who may not rotate, to accompany patient in pre-op.  Children under the age of 91 must have an adult with them who is not the patient and must remain in the main waiting area with an adult.  If the patient needs to stay at the hospital during part of their recovery, the visitor guidelines for inpatient rooms apply.  Please refer to the [facility] website for the visitor guidelines for any additional information.   If you received a COVID test during your pre-op visit  it is requested that you wear a mask when out in public, stay away from anyone that may not be feeling well and notify your surgeon if you develop symptoms. If you have been in contact with anyone that has tested positive in the last 10 days please notify you surgeon.      Pre-operative 4 CHG Bathing Instructions   You can play a key role in reducing the risk of infection after surgery. Your skin needs to be as free of germs as possible. You can reduce the number of germs on your skin by washing with CHG (chlorhexidine  gluconate) soap before surgery. CHG is an antiseptic soap that kills germs and continues to kill germs even after washing.   DO NOT use if you have an allergy to chlorhexidine /CHG or antibacterial soaps. If your skin becomes reddened or irritated, stop using the CHG and notify one of our RNs at [phone].   Please shower with the  CHG soap starting 4 days before surgery using the following schedule:     Please keep in mind the following:  DO NOT shave, including legs and underarms, starting the day of your first shower.   You may shave your face at any point before/day of surgery.  Place clean sheets on your bed the day you start using CHG soap. Use a clean washcloth (not used since being washed) for each shower. DO NOT sleep with pets once you start using the CHG.   CHG Shower Instructions:  Wash your face and private area  with normal soap. If you choose to wash your hair, wash first with your normal shampoo.  After you use shampoo/soap, rinse your hair and body thoroughly to remove shampoo/soap residue.  Turn the water  OFF and apply  bottle of CHG soap to a CLEAN washcloth.  Apply CHG soap ONLY FROM YOUR NECK DOWN TO YOUR TOES (washing for 3-5 minutes)  DO NOT use CHG soap on face, private areas, open wounds, or sores.  Pay special attention to the area where your surgery is being performed.  If you are having back surgery, having someone wash your back for you may be helpful. Wait 2 minutes after CHG soap is applied, then you may rinse off the CHG soap.  Pat dry with a clean towel  Put on clean clothes/pajamas   If you choose to wear lotion, please use ONLY the CHG-compatible lotions that are listed below.  Additional instructions for the day of surgery:  If you choose, you may shower the morning of surgery with an antibacterial soap.  DO NOT APPLY any lotions, deodorants, cologne, or perfumes.   Do not bring valuables to the hospital. [facility] is not responsible for any belongings/valuables. Do not wear nail polish, gel polish, artificial nails, or any other type of covering on natural nails (fingers and toes) Do not wear jewelry or makeup Put on clean/comfortable clothes.  Please brush your teeth.  Ask your nurse before applying any prescription medications to the skin.     CHG Compatible Lotions   Aveeno Moisturizing lotion  Cetaphil Moisturizing Cream  Cetaphil Moisturizing Lotion  Clairol Herbal Essence Moisturizing Lotion, Dry Skin  Clairol Herbal Essence Moisturizing Lotion, Extra Dry Skin  Clairol Herbal Essence Moisturizing Lotion, Normal Skin  Curel Age Defying Therapeutic Moisturizing Lotion with Alpha Hydroxy  Curel Extreme Care Body Lotion  Curel Soothing Hands Moisturizing Hand Lotion  Curel Therapeutic Moisturizing Cream, Fragrance-Free  Curel Therapeutic Moisturizing Lotion,  Fragrance-Free  Curel Therapeutic Moisturizing Lotion, Original Formula  Eucerin Daily Replenishing Lotion  Eucerin Dry Skin Therapy Plus Alpha Hydroxy Crme  Eucerin Dry Skin Therapy Plus Alpha Hydroxy Lotion  Eucerin Original Crme  Eucerin Original Lotion  Eucerin Plus Crme Eucerin Plus Lotion  Eucerin TriLipid Replenishing Lotion  Keri Anti-Bacterial Hand Lotion  Keri Deep Conditioning Original Lotion Dry Skin Formula Softly Scented  Keri Deep Conditioning Original Lotion, Fragrance Free Sensitive Skin Formula  Keri Lotion Fast Absorbing Fragrance Free Sensitive Skin Formula  Keri Lotion Fast Absorbing Softly Scented Dry Skin Formula  Keri Original Lotion  Keri Skin Renewal Lotion Keri Silky Smooth Lotion  Keri Silky Smooth Sensitive Skin Lotion  Nivea Body Creamy Conditioning Oil  Nivea Body Extra Enriched Teacher, Adult Education Moisturizing Lotion Nivea Crme  Nivea Skin Firming Lotion  NutraDerm 30 Skin Lotion  NutraDerm Skin Lotion  NutraDerm Therapeutic Skin Cream  NutraDerm Therapeutic Skin Lotion  ProShield Protective  Hand Cream  Provon moisturizing lotion  Please read over the following fact sheets that you were given.

## 2024-09-24 ENCOUNTER — Other Ambulatory Visit: Payer: Self-pay | Admitting: Cardiology

## 2024-09-24 ENCOUNTER — Other Ambulatory Visit: Payer: Self-pay

## 2024-09-24 ENCOUNTER — Telehealth: Payer: Self-pay | Admitting: Cardiology

## 2024-09-24 ENCOUNTER — Encounter (HOSPITAL_COMMUNITY)

## 2024-09-24 ENCOUNTER — Encounter (HOSPITAL_COMMUNITY)
Admission: RE | Admit: 2024-09-24 | Discharge: 2024-09-24 | Disposition: A | Discharge status: Home / Self Care | Source: Ambulatory Visit | Attending: Neurological Surgery | Admitting: Neurological Surgery

## 2024-09-24 ENCOUNTER — Encounter (HOSPITAL_COMMUNITY): Payer: Self-pay

## 2024-09-24 ENCOUNTER — Other Ambulatory Visit (HOSPITAL_BASED_OUTPATIENT_CLINIC_OR_DEPARTMENT_OTHER): Payer: Self-pay

## 2024-09-24 VITALS — BP 133/69 | HR 74 | Temp 97.8°F | Resp 19 | Ht 73.0 in | Wt 343.0 lb

## 2024-09-24 DIAGNOSIS — Z6841 Body Mass Index (BMI) 40.0 and over, adult: Secondary | ICD-10-CM | POA: Insufficient documentation

## 2024-09-24 DIAGNOSIS — M5412 Radiculopathy, cervical region: Secondary | ICD-10-CM | POA: Insufficient documentation

## 2024-09-24 DIAGNOSIS — Z01812 Encounter for preprocedural laboratory examination: Secondary | ICD-10-CM | POA: Diagnosis not present

## 2024-09-24 DIAGNOSIS — Z85528 Personal history of other malignant neoplasm of kidney: Secondary | ICD-10-CM | POA: Insufficient documentation

## 2024-09-24 DIAGNOSIS — Z01818 Encounter for other preprocedural examination: Secondary | ICD-10-CM | POA: Diagnosis present

## 2024-09-24 DIAGNOSIS — I252 Old myocardial infarction: Secondary | ICD-10-CM | POA: Insufficient documentation

## 2024-09-24 DIAGNOSIS — F17210 Nicotine dependence, cigarettes, uncomplicated: Secondary | ICD-10-CM | POA: Diagnosis not present

## 2024-09-24 DIAGNOSIS — E662 Morbid (severe) obesity with alveolar hypoventilation: Secondary | ICD-10-CM | POA: Insufficient documentation

## 2024-09-24 DIAGNOSIS — N183 Chronic kidney disease, stage 3 unspecified: Secondary | ICD-10-CM | POA: Diagnosis not present

## 2024-09-24 DIAGNOSIS — I130 Hypertensive heart and chronic kidney disease with heart failure and stage 1 through stage 4 chronic kidney disease, or unspecified chronic kidney disease: Secondary | ICD-10-CM | POA: Diagnosis not present

## 2024-09-24 DIAGNOSIS — I5032 Chronic diastolic (congestive) heart failure: Secondary | ICD-10-CM | POA: Insufficient documentation

## 2024-09-24 LAB — BASIC METABOLIC PANEL WITH GFR
Anion gap: 9 (ref 5–15)
BUN: 25 mg/dL — ABNORMAL HIGH (ref 6–20)
CO2: 24 mmol/L (ref 22–32)
Calcium: 9.2 mg/dL (ref 8.9–10.3)
Chloride: 105 mmol/L (ref 98–111)
Creatinine, Ser: 1.82 mg/dL — ABNORMAL HIGH (ref 0.61–1.24)
GFR, Estimated: 46 mL/min — ABNORMAL LOW
Glucose, Bld: 95 mg/dL (ref 70–99)
Potassium: 4.1 mmol/L (ref 3.5–5.1)
Sodium: 138 mmol/L (ref 135–145)

## 2024-09-24 LAB — CBC
HCT: 43.5 % (ref 39.0–52.0)
Hemoglobin: 14.1 g/dL (ref 13.0–17.0)
MCH: 27.3 pg (ref 26.0–34.0)
MCHC: 32.4 g/dL (ref 30.0–36.0)
MCV: 84.1 fL (ref 80.0–100.0)
Platelets: 305 K/uL (ref 150–400)
RBC: 5.17 MIL/uL (ref 4.22–5.81)
RDW: 14 % (ref 11.5–15.5)
WBC: 8.9 K/uL (ref 4.0–10.5)
nRBC: 0 % (ref 0.0–0.2)

## 2024-09-24 LAB — SURGICAL PCR SCREEN
MRSA, PCR: NEGATIVE
Staphylococcus aureus: POSITIVE — AB

## 2024-09-24 NOTE — Progress Notes (Signed)
 Surgical Instructions   Your procedure is scheduled on January 5. Report to [facility] Main Entrance A at 5:30 A.M., then check in with the Admitting office. Any questions or running late day of surgery: call [phone]  Questions prior to your surgery date: call [phone], Monday-Friday, 8am-4pm. If you experience any cold or flu symptoms such as cough, fever, chills, shortness of breath, etc. between now and your scheduled surgery, please notify us  at the above number.     Remember:  Do not eat or drink anything after midnight the night before your surgery   Take these medicines the morning of surgery with A SIP OF WATER   amLODipine  (NORVASC )  Amoxicillin  cloNIDine  (CATAPRES )  empagliflozin  (JARDIANCE )  isosorbide -hydrALAZINE  (BIDIL )  metoprolol  succinate (TOPROL  XL)   May take these medicines IF NEEDED: acetaminophen  (TYLENOL )  albuterol  (VENTOLIN  HFA) -please bring inhaler to the hospital. tiZANidine (ZANAFLEX)  nitroGLYCERIN  (NITROSTAT ) if you have to take this medicine at anytime prior to surgery,call [phone] to notify surgical staff.   One week prior to surgery, STOP taking any Aspirin  (unless otherwise instructed by your surgeon) Aleve , Naproxen , Ibuprofen , Motrin , Advil , Goody's, BC's, all herbal medications, fish oil, and non-prescription vitamins.   HOLD  tirzepatide  (ZEPBOUND ) 7 DAYS PRIOR TO SURGERY.    tAKE YOUR LASE DOSE ON OR BEFORE 09/23/24.            Do NOT Smoke (Tobacco/Vaping) for 24 hours prior to your procedure.  If you use a CPAP at night, you may bring your mask/headgear for your overnight stay.   You will be asked to remove any contacts, glasses, piercing's, hearing aid's, dentures/partials prior to surgery. Please bring cases for these items if needed.    Patients discharged the day of surgery will not be allowed to drive home, and someone needs to stay with them for 24 hours.  SURGICAL WAITING ROOM VISITATION Patients may have no more  than 2 support people in the waiting area - these visitors may rotate.   Pre-op nurse will coordinate an appropriate time for 1 ADULT support person, who may not rotate, to accompany patient in pre-op.  Children under the age of 34 must have an adult with them who is not the patient and must remain in the main waiting area with an adult.  If the patient needs to stay at the hospital during part of their recovery, the visitor guidelines for inpatient rooms apply.  Please refer to the [facility] website for the visitor guidelines for any additional information.   If you received a COVID test during your pre-op visit  it is requested that you wear a mask when out in public, stay away from anyone that may not be feeling well and notify your surgeon if you develop symptoms. If you have been in contact with anyone that has tested positive in the last 10 days please notify you surgeon.      Pre-operative 4 CHG Bathing Instructions   You can play a key role in reducing the risk of infection after surgery. Your skin needs to be as free of germs as possible. You can reduce the number of germs on your skin by washing with CHG (chlorhexidine  gluconate) soap before surgery. CHG is an antiseptic soap that kills germs and continues to kill germs even after washing.   DO NOT use if you have an allergy to chlorhexidine /CHG or antibacterial soaps. If your skin becomes reddened or irritated, stop using the CHG and notify one of our RNs at  [phone].   Please shower with the CHG soap starting 4 days before surgery using the following schedule:     Please keep in mind the following:  DO NOT shave, including legs and underarms, starting the day of your first shower.   You may shave your face at any point before/day of surgery.  Place clean sheets on your bed the day you start using CHG soap. Use a clean washcloth (not used since being washed) for each shower. DO NOT sleep with pets once you start using  the CHG.   CHG Shower Instructions:  Wash your face and private area with normal soap. If you choose to wash your hair, wash first with your normal shampoo.  After you use shampoo/soap, rinse your hair and body thoroughly to remove shampoo/soap residue.  Turn the water  OFF and apply  bottle of CHG soap to a CLEAN washcloth.  Apply CHG soap ONLY FROM YOUR NECK DOWN TO YOUR TOES (washing for 3-5 minutes)  DO NOT use CHG soap on face, private areas, open wounds, or sores.  Pay special attention to the area where your surgery is being performed.  If you are having back surgery, having someone wash your back for you may be helpful. Wait 2 minutes after CHG soap is applied, then you may rinse off the CHG soap.  Pat dry with a clean towel  Put on clean clothes/pajamas   If you choose to wear lotion, please use ONLY the CHG-compatible lotions that are listed below.  Additional instructions for the day of surgery:  If you choose, you may shower the morning of surgery with an antibacterial soap.  DO NOT APPLY any lotions, deodorants, cologne, or perfumes.   Do not bring valuables to the hospital. [facility] is not responsible for any belongings/valuables. Do not wear nail polish, gel polish, artificial nails, or any other type of covering on natural nails (fingers and toes) Do not wear jewelry or makeup Put on clean/comfortable clothes.  Please brush your teeth.  Ask your nurse before applying any prescription medications to the skin.     CHG Compatible Lotions   Aveeno Moisturizing lotion  Cetaphil Moisturizing Cream  Cetaphil Moisturizing Lotion  Clairol Herbal Essence Moisturizing Lotion, Dry Skin  Clairol Herbal Essence Moisturizing Lotion, Extra Dry Skin  Clairol Herbal Essence Moisturizing Lotion, Normal Skin  Curel Age Defying Therapeutic Moisturizing Lotion with Alpha Hydroxy  Curel Extreme Care Body Lotion  Curel Soothing Hands Moisturizing Hand Lotion  Curel Therapeutic  Moisturizing Cream, Fragrance-Free  Curel Therapeutic Moisturizing Lotion, Fragrance-Free  Curel Therapeutic Moisturizing Lotion, Original Formula  Eucerin Daily Replenishing Lotion  Eucerin Dry Skin Therapy Plus Alpha Hydroxy Crme  Eucerin Dry Skin Therapy Plus Alpha Hydroxy Lotion  Eucerin Original Crme  Eucerin Original Lotion  Eucerin Plus Crme Eucerin Plus Lotion  Eucerin TriLipid Replenishing Lotion  Keri Anti-Bacterial Hand Lotion  Keri Deep Conditioning Original Lotion Dry Skin Formula Softly Scented  Keri Deep Conditioning Original Lotion, Fragrance Free Sensitive Skin Formula  Keri Lotion Fast Absorbing Fragrance Free Sensitive Skin Formula  Keri Lotion Fast Absorbing Softly Scented Dry Skin Formula  Keri Original Lotion  Keri Skin Renewal Lotion Keri Silky Smooth Lotion  Keri Silky Smooth Sensitive Skin Lotion  Nivea Body Creamy Conditioning Oil  Nivea Body Extra Enriched Teacher, Adult Education Moisturizing Lotion Nivea Crme  Nivea Skin Firming Lotion  NutraDerm 30 Skin Lotion  NutraDerm Skin Lotion  NutraDerm Therapeutic Skin Cream  NutraDerm Therapeutic Skin Lotion  ProShield Protective Hand Cream  Provon moisturizing lotion  Please read over the following fact sheets that you were given.

## 2024-09-24 NOTE — Telephone Encounter (Signed)
" °*  STAT* If patient is at the pharmacy, call can be transferred to refill team.   1. Which medications need to be refilled? (please list name of each medication and dose if known) sosorbide-hydrALAZINE  (BIDIL ) 20-37.5 MG tablet    2. Would you like to learn more about the convenience, safety, & potential cost savings by using the [facility]? NO    3. Are you open to using the [facility] (Type [facility]. No    4. Which pharmacy/location (including street and city if local pharmacy) is medication to be sent to?[facility]    5. Do they need a 30 day or 90 day supply? 30 day   "

## 2024-09-24 NOTE — Progress Notes (Signed)
 PCP - Dr. Quintin sheerer [place] Cardiologist - Dr. Dub Huntsman,  LOV 08/30/2024 with NP  PPM/ICD - denies Device Orders - na Rep Notified - na  Chest x-ray - 07/25/2024 EKG - 04/29/2024 Stress Test - 05/04/2024 ECHO - 06/12/2024 Cardiac Cath - 06/11/2024  Sleep Study - OSA with nightly CPAP CPAP -   Non-diabetic  Last dose of GLP1 agonist-  Zepbound  GLP1 instructions: hold 7 days, states last dose 09/17/2024  Blood Thinner Instructions: denies Aspirin  Instructions:denies  ERAS Protcol -NPO  Anesthesia review: Yes. Cardiac clearance, CAD, HTN, CHF, STEMI 05/2024  Patient denies shortness of breath, fever, cough and chest pain at PAT appointment   All instructions explained to the patient, with a verbal understanding of the material. Patient agrees to go over the instructions while at home for a better understanding. Patient also instructed to self quarantine after being tested for COVID-19. The opportunity to ask questions was provided.

## 2024-09-25 ENCOUNTER — Other Ambulatory Visit (HOSPITAL_BASED_OUTPATIENT_CLINIC_OR_DEPARTMENT_OTHER): Payer: Self-pay

## 2024-09-25 MED ORDER — ISOSORB DINITRATE-HYDRALAZINE 20-37.5 MG PO TABS
2.0000 | ORAL_TABLET | Freq: Three times a day (TID) | ORAL | 3 refills | Status: DC
  Filled 2024-09-25: qty 180, 30d supply, fill #0

## 2024-09-25 MED ORDER — ISOSORB DINITRATE-HYDRALAZINE 20-37.5 MG PO TABS: 2.0000 | ORAL_TABLET | Freq: Three times a day (TID) | ORAL | 3 refills | Status: AC

## 2024-09-25 NOTE — Anesthesia Preprocedure Evaluation (Addendum)
 "                                  Anesthesia Evaluation  Patient identified by MRN, date of birth, ID band Patient awake    Reviewed: Allergy & Precautions, NPO status , Patient's Chart, lab work & pertinent test results, reviewed documented beta blocker date and time   Airway Mallampati: III  TM Distance: >3 FB Neck ROM: Limited    Dental  (+) Missing, Dental Advisory Given   Pulmonary shortness of breath, with exertion, at rest and lying, sleep apnea and Continuous Positive Airway Pressure Ventilation , pneumonia, resolved, Current Smoker and Patient abstained from smoking.   breath sounds clear to auscultation + decreased breath sounds      Cardiovascular hypertension, Pt. on medications and Pt. 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 Left ventricular ejection fraction, by estimation, is 60 to 65%. The  left ventricle has normal function. The left ventricle has no regional  wall motion abnormalities. There is mild left ventricular hypertrophy.  Left ventricular diastolic parameters  were normal.   2. Right ventricular systolic function is normal. The right ventricular  size is normal.   3. The mitral valve is normal in structure. No evidence of mitral valve  regurgitation. No evidence of mitral stenosis.   4. The aortic valve is normal in structure. Aortic valve regurgitation is  not visualized. No aortic stenosis is present.   5. The inferior vena cava is normal in size with greater than 50%  respiratory variability, suggesting right atrial pressure of 3 mmHg.   Cardiac Cath 06/11/24 1.  Diffuse tapering of the mid and distal LAD with angiographic appearance of diffuse intramural hematoma and apical dissection 2.  Widely patent RCA, left main, proximal LAD, and left circumflex/intermediate branches  with no significant stenoses 3.  Normal LVEDP   After review of his films in comparison to his old study, the patient may have both coronary vasospasm and currently with spontaneous coronary artery dissection.  On today's study, the apical LAD is involved and since the LAD wraps the apex and supplies the distal inferior wall I think this is the etiology of his inferior ST elevation. Plan medical therapy.      Neuro/Psych  Headaches PSYCHIATRIC DISORDERS       Neuromuscular disease    GI/Hepatic Neg liver ROS,GERD  Medicated,,Lab Results      Component                Value               Date                      ALT                      21                  06/11/2024                AST                      22  06/11/2024                ALKPHOS                  116                 06/11/2024                BILITOT                  0.4                 06/11/2024              Endo/Other    Class 3 obesityGLP-1 RA therapy- last dose 09/17/24 Gout  Renal/GU Renal InsufficiencyRenal diseaseHx/o right kidney Ca S/P cryoablation  Lab Results      Component                Value               Date                      NA                       138                 09/24/2024                CL                       105                 09/24/2024                K                        4.1                 09/24/2024                CO2                      24                  09/24/2024                BUN                      25 (H)              09/24/2024                CREATININE               1.82 (H)            09/24/2024                GFRNONAA                 46 (L)              09/24/2024                CALCIUM                   9.2  09/24/2024                PHOS                     3.2                 04/11/2020                ALBUMIN                  4.6                 06/11/2024                GLUCOSE                  95                  09/24/2024              negative genitourinary   Musculoskeletal  (+) Arthritis , Osteoarthritis,  Cervical radiculopathy   Abdominal  (+) + obese  Peds  Hematology Lab Results      Component                Value               Date                      WBC                      8.9                 09/24/2024                HGB                      14.1                09/24/2024                HCT                      43.5                09/24/2024                MCV                      84.1                09/24/2024                PLT                      305                 09/24/2024              Anesthesia Other Findings   Reproductive/Obstetrics                              Anesthesia Physical Anesthesia Plan  ASA: 3  Anesthesia Plan: General   Post-op Pain Management: Dilaudid  IV, Precedex and Ofirmev  IV (intra-op)*   Induction: Intravenous  PONV Risk Score and Plan: 2 and Treatment may vary due to  age or medical condition  Airway Management Planned:   Additional Equipment: Arterial line and None  Intra-op Plan:   Post-operative Plan: Extubation in OR  Informed Consent: I have reviewed the patients History and Physical, chart, labs and discussed the procedure including the risks, benefits and alternatives for the proposed anesthesia with the patient or authorized representative who has indicated his/her understanding and acceptance.     Dental advisory given  Plan Discussed with: Anesthesiologist and CRNA  Anesthesia Plan Comments: (See PAT note written 09/25/2024 by Allison Zelenak, PA-C.  )         Anesthesia Quick Evaluation  "

## 2024-09-25 NOTE — Progress Notes (Signed)
 Anesthesia Chart Review:  Case: [id number] Date/Time: 10/01/24 0715   Procedure: ANTERIOR CERVICAL DECOMPRESSION/DISCECTOMY FUSION 1 LEVEL - ACDF - C6-C7   Anesthesia type: General   Diagnosis: Radiculopathy, cervical region [M54.12]   Pre-op diagnosis: Radiculopathy cervical region   [facility] ROOM [facility] / [facility]   Surgeons: Joshua Alm Hamilton, MD       DISCUSSION: Patient is a 43 year old male scheduled for the above procedure.  History includes smoking, HTN, inferior STEMI related to vasospasm & SCAD of apical LAD 06/11/2024), PFO (6/32023), chronic diastolic CHF, right renal cancer (s/p right renal mass cryoablation 02/16/2023), CKD (stage III), OSA/OHS (uses CPAP). BMI is consistent with morbid obesity.  He had cardiology visit with Gerard Frederick, NP on 08/30/2024 for routine follow-up and preoperative cardiology evaluation.  He was initially established with Dr. Sheena in 2020 for uncontrolled HTN. TTE with bubble study 02/27/2022 revealed a  PFO (~0.65 cm) with left to right shunt. In 2024 he had admission for chest pain in setting of hypertensive emergency. He had low risk stress test and normal LVEF. Recurrent chest pain in July 2025 with moderate risk nuclear stress test on 05/04/2024 for small region of ischemia in the basal to mid inferolateral wall and mid to distal inferior and inferoapical wall. He underwent RHC/LHC on 05/18/2024 that showed 70% ostial/proximal RCA vasospasm improved to 30% with intracoronary nitroglycerin , mild pulmonary hypertension with PA 40/19 with mean 26 mmHg, and moderate AV/LVOT peak to peak gradient 24 mmHg. Normal LVEF, no AS by 05/24/2024 TTE. On 06/11/2024 ED visit with transfer to [facility] for code STEMI with emergent LHC which revealed SCAD involving the apical LAD with intramural hematoma otherwise no significant CAD. Most recent TTE on 06/12/2024 showed LVEF 60-65%, no RWMA, mild LVH, normal LV diastolic parameters, normal RV systolic function, normal RV size, no  significant valvular disease.   At 08/30/2024 cardiology follow-up, he was participating in cardiac rehab. He had lost almost 50 lbs with improvement in breathing and BP control. He denied chest pain. He had recently returned Zio XT monitor (ordered for palpitations), so results were pending. Plan for cMRI in the future given echo with mild LVH and mildly dilated internal cavity, grade II diastolic dysfunction (improved on 05/2024 TTE).  Compliance with CPAP, but had started smoking again, so smoking cessation encouraged. In regards to planned ACDF, she wrote, Mr. Byers perioperative risk of a major cardiac event is 0.9% according to the Revised Cardiac Risk Index (RCRI).  Therefore, he is at low risk for perioperative complications.   His functional capacity is fair at 6.27 METs according to the Duke Activity Status Index (DASI). Recommendations: According to ACC/AHA guidelines, no further cardiovascular testing needed.  The patient may proceed to surgery at acceptable risk.  Patient recently had STEMI with scad in the hospital back in September.  He was advised this he would have to wait a minimum of 3 months after his event prior to have a surgical procedure. 3 month follow-up planned.   Last pulmonary follow-up with Dr. Isaiah was on 09/04/2024. He had excellent CPAP compliance. Still smoking. Dyspnea felt stable and likely related to underlying reactive airways disease and COPD due to extensive smoking history in setting of morbid obesity and deconditioning, as well as CKD with chronic diastolic CHF.  On Zepbound  for weight loss.  1 year pulmonary follow-up with chest CT recommended for LLL lung nodule surveillance.  Preoperative labs noted. Cr 1.82, which appears consistent with recent results. Has known history  of CKD and is followed at [facility].  Last Zepbound  as of 09/17/2024 and advised at PAT to hold for 7 days prior to surgery. He is on Jardiance  for CHF and not DM. A1c 5.3%  on 06/11/2024.  Anesthesia team to evaluate on the day of surgery.    VS: BP 133/69   Pulse 74   Temp 36.6 C   Resp 19   Ht 6' 1 (1.854 m)   Wt (!) 155.6 kg   SpO2 98%   BMI 45.25 kg/m   PROVIDERS: [place], Quintin PARAS, MD is PCP  Sheena Pugh, DO is cardiologist  Isaiah Scrivener, MD is pulmonologist Macel Savant, MD is nephrologist. 08/07/2024 office note is scanned under Media tab. Philip Cornet, MD is IR (for renal cryoablation)   LABS: Preoperative labs noted. See DISCUSSION. (all labs ordered are listed, but only abnormal results are displayed)  Labs Reviewed  SURGICAL PCR SCREEN - Abnormal; Notable for the following components:      Result Value   Staphylococcus aureus POSITIVE (*)    All other components within normal limits  BASIC METABOLIC PANEL WITH GFR - Abnormal; Notable for the following components:   BUN 25 (*)    Creatinine, Ser 1.82 (*)    GFR, Estimated 46 (*)    All other components within normal limits  CBC    PFTs 05/02/2024:  FVC 4.20 (73%), post 3.38 (59%). FEV1 3.13 (69%), post 2.52 (56%). FEV1 FVC ratio 75% predicted TLC 82% predicted RV 86% predicted DLCO unc 34.95 (105%) No significant bronchodilator response Flow-volume loops suggestive of restrictive and obstructive pattern Findings suggest underlying obstructive lung disease and restrictive lung disease most likely from morbid obesity.   IMAGES: CT Chest 07/27/2024: IMPRESSION: 1. Stable 2 to 3 mm left lower lobe pulmonary nodule with no new pulmonary nodules; continued surveillance on imaging follow-up.   CT C-spine 02/18/2024: IMPRESSION: Scattered degenerative spurring without acute finding.  MRI C-spine 02/22/2024: Images in Canopy/PACS.  MRV Head 02/27/2022: IMPRESSION: 1. Negative for dural venous sinus thrombosis. 2. Mildly effaced appearance of both transverse-sigmoid venous sinus junctions, a finding which can be seen with idiopathic intracranial hypertension (pseudotumor  cerebri).   EKG: 07/30/2024: Normal sinus rhythm   CV: Long Term Monitor 07/12/2024: Pending.   TTE 06/12/2024: IMPRESSIONS   1. Left ventricular ejection fraction, by estimation, is 60 to 65%. The  left ventricle has normal function. The left ventricle has no regional  wall motion abnormalities. There is mild left ventricular hypertrophy.  Left ventricular diastolic parameters  were normal.   2. Right ventricular systolic function is normal. The right ventricular  size is normal.   3. The mitral valve is normal in structure. No evidence of mitral valve  regurgitation. No evidence of mitral stenosis.   4. The aortic valve is normal in structure. Aortic valve regurgitation is  not visualized. No aortic stenosis is present.   5. The inferior vena cava is normal in size with greater than 50%  respiratory variability, suggesting right atrial pressure of 3 mmHg.   6.  No atrial level shunt detected by color-flow Doppler. - Comparison 05/24/2024: LVEF 60-65%, mildly dilated LV cavity, grade II DD, normal RV systolic function, moderate RVH, moderately dilated LA, trivial AR, AV sclerosis/calcification without AS, no atrial level shunt detected by color-flow Doppler.   Cardiac cath 06/11/2024: LM: Vessel is large.  Angiographically normal. LAD: Vessel is large.  There is mild diffuse disease throughout the vessel.  Distal LAD with #  1 75% lesion and #2 95% lesion. D1: Vessel is moderate in size Ramus intermedius: Vessel is large large.  Minimal luminal irregularities. LCx: Vessel is large.  Minimal luminal irregularities. OM1: Vessel is small. OM 2: Vessel is large. OM 3: Vessel is moderate in size. OM 4.  Vessel is moderate in size. RCA: Vessel is large.  Widely patent vessel with no stenosis.  The ostium is widely patent without any evidence of ostial lesion. RPDA: Vessel is moderate in size. RPAV: Vessel is small. RPL 1: Vessel is small.  Conclusions: 1.  Diffuse tapering of the  mid and distal LAD with angiographic appearance of diffuse intramural hematoma and apical dissection 2.  Widely patent RCA, left main, proximal LAD, and left circumflex/intermediate branches with no significant stenoses 3.  Normal LVEDP   After review of his films in comparison to his old study, the patient may have both coronary vasospasm and currently with spontaneous coronary artery dissection.  On today's study, the apical LAD is involved and since the LAD wraps the apex and supplies the distal inferior wall I think this is the etiology of his inferior ST elevation. Plan medical therapy.    RHC/LHC 05/18/2024 (following moderate risk stress test 05/04/2024):  Conclusions: 70% ostial/proximal RCA stenosis that improves to 30% with intracoronary nitroglycerin  suggesting a significant component of vasospasm.  Otherwise, no angiographically significant coronary artery disease. Upper normal to mildly elevated left heart filling pressures (PCWP 17, LVEDP 18 mmHg). Moderately elevated right heart filling pressures (mean RA 13, RV 43/14 mmHg). Mild pulmonary hypertension (PA 40/19, mean 26 mmHg). Normal Fick cardiac output/index (CO 8.2 L/min, CI 2.8 L/min/m). Moderate aortic valve/LVOT peak to peak gradient (24 mmHg).   Recommendations: Continue medical therapy and risk factor modification to prevent progression of CAD.  Continue isosorbide  mononitrate and amlodipine  for treatment of possible component of coronary vasospasm. Proceed with echocardiogram for further assessment of possible LVOT/aortic valve gradient.  If echo is inconclusive, cardiac MRI may be helpful. Maintain net even to slightly negative fluid balance. Avoid nephrotoxic agents, including NSAIDs.    TTE with bubble study 02/27/2022 revealed a  PFO (~0.65 cm) identified with color doppler showing left to right shunt. No IAS/Shunts had been identified on previously 10/23/2020 and 10/19/20218 echocardiograms.     Past Medical History:   Diagnosis Date   Arthritis    Cancer (HCC)    right kidney   CHF (congestive heart failure) (HCC)    pt. 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    Past Surgical History:  Procedure Laterality Date   CARDIAC CATHETERIZATION     CORONARY/GRAFT ACUTE MI REVASCULARIZATION N/A 06/11/2024   Procedure: Coronary/Graft Acute MI Revascularization;  Surgeon: Wonda Sharper, MD;  Location: [facility] INVASIVE CV LAB;  Service: Cardiovascular;  Laterality: N/A;   IR RADIOLOGIST EVAL & MGMT  03/07/2024   LEFT HEART CATH AND CORONARY ANGIOGRAPHY N/A 06/11/2024   Procedure: LEFT HEART CATH AND CORONARY ANGIOGRAPHY;  Surgeon: Wonda Sharper, MD;  Location: [facility] INVASIVE CV LAB;  Service: Cardiovascular;  Laterality: N/A;   RADIOFREQUENCY ABLATION KIDNEY Right    RADIOLOGY WITH ANESTHESIA Right 02/16/2023   Procedure: RIGHT RENAL  MASS CRYO ABLATION;  Surgeon: Philip Cornet, MD;  Location: [facility] ORS;  Service: Anesthesiology;  Laterality: Right;   RIGHT/LEFT HEART CATH AND CORONARY ANGIOGRAPHY Bilateral 05/18/2024   Procedure: RIGHT/LEFT HEART CATH AND CORONARY ANGIOGRAPHY;  Surgeon: Mady Bruckner, MD;  Location: [facility] INVASIVE CV LAB;  Service: Cardiovascular;  Laterality: Bilateral;   two knee surgeries Left 09/27/1993    MEDICATIONS:  acetaminophen  (TYLENOL ) 500 MG tablet   albuterol  (VENTOLIN  HFA) 108 (90 Base) MCG/ACT inhaler   amLODipine  (NORVASC ) 10 MG tablet   amoxicillin  (AMOXIL ) 500 MG capsule   chlorthalidone  (HYGROTON ) 25 MG tablet   cloNIDine  (CATAPRES ) 0.1 MG tablet   empagliflozin  (JARDIANCE ) 10 MG TABS tablet   furosemide  (LASIX ) 20 MG tablet   ibuprofen  (ADVIL ) 600 MG tablet    isosorbide -hydrALAZINE  (BIDIL ) 20-37.5 MG tablet   metoprolol  succinate (TOPROL  XL) 25 MG 24 hr tablet   nicotine  (NICODERM CQ  - DOSED IN MG/24 HOURS) 14 mg/24hr patch   nitroGLYCERIN  (NITROSTAT ) 0.4 MG SL tablet   spironolactone  (ALDACTONE ) 25 MG tablet   tirzepatide  (ZEPBOUND ) 5 MG/0.5ML injection vial   tirzepatide  (ZEPBOUND ) 7.5 MG/0.5ML Pen   tiZANidine (ZANAFLEX) 4 MG tablet   valsartan  (DIOVAN ) 160 MG tablet   No current facility-administered medications for this encounter.   Augmentin is on his list from 09/18/2024, reported prescribed following tooth pulling by dentist.  Isaiah Ruder, PA-C Surgical Short Stay/Anesthesiology [facility] Phone [phone] [facility] Phone [phone] 09/25/2024 12:22 PM

## 2024-09-25 NOTE — Telephone Encounter (Signed)
 Refill complete

## 2024-09-26 ENCOUNTER — Encounter (HOSPITAL_COMMUNITY)

## 2024-09-26 ENCOUNTER — Other Ambulatory Visit: Payer: Self-pay | Admitting: Diagnostic Radiology

## 2024-09-26 DIAGNOSIS — N2889 Other specified disorders of kidney and ureter: Secondary | ICD-10-CM

## 2024-09-28 ENCOUNTER — Encounter (HOSPITAL_COMMUNITY)

## 2024-09-28 ENCOUNTER — Encounter (HOSPITAL_BASED_OUTPATIENT_CLINIC_OR_DEPARTMENT_OTHER): Payer: Self-pay | Admitting: Family Medicine

## 2024-09-30 ENCOUNTER — Encounter (HOSPITAL_COMMUNITY): Payer: Self-pay | Admitting: Neurological Surgery

## 2024-10-01 ENCOUNTER — Other Ambulatory Visit: Payer: Self-pay

## 2024-10-01 ENCOUNTER — Encounter (HOSPITAL_COMMUNITY): Payer: Self-pay | Admitting: Neurological Surgery

## 2024-10-01 ENCOUNTER — Ambulatory Visit (HOSPITAL_COMMUNITY)

## 2024-10-01 ENCOUNTER — Encounter (HOSPITAL_COMMUNITY)

## 2024-10-01 ENCOUNTER — Observation Stay (HOSPITAL_COMMUNITY)
Admission: RE | Admit: 2024-10-01 | Discharge: 2024-10-02 | Disposition: A | Discharge status: Home / Self Care | Attending: Neurological Surgery | Admitting: Neurological Surgery

## 2024-10-01 ENCOUNTER — Encounter (HOSPITAL_COMMUNITY): Payer: Self-pay | Admitting: Vascular Surgery

## 2024-10-01 ENCOUNTER — Ambulatory Visit (HOSPITAL_BASED_OUTPATIENT_CLINIC_OR_DEPARTMENT_OTHER): Payer: Self-pay | Admitting: Anesthesiology

## 2024-10-01 ENCOUNTER — Encounter (HOSPITAL_COMMUNITY)
Admission: RE | Disposition: A | Discharge status: Home / Self Care | Payer: Self-pay | Source: Home / Self Care | Attending: Neurological Surgery

## 2024-10-01 DIAGNOSIS — M4722 Other spondylosis with radiculopathy, cervical region: Secondary | ICD-10-CM | POA: Diagnosis not present

## 2024-10-01 DIAGNOSIS — M4802 Spinal stenosis, cervical region: Secondary | ICD-10-CM

## 2024-10-01 DIAGNOSIS — Z955 Presence of coronary angioplasty implant and graft: Secondary | ICD-10-CM | POA: Insufficient documentation

## 2024-10-01 DIAGNOSIS — Z85528 Personal history of other malignant neoplasm of kidney: Secondary | ICD-10-CM | POA: Diagnosis not present

## 2024-10-01 DIAGNOSIS — I2510 Atherosclerotic heart disease of native coronary artery without angina pectoris: Secondary | ICD-10-CM

## 2024-10-01 DIAGNOSIS — I509 Heart failure, unspecified: Secondary | ICD-10-CM | POA: Diagnosis not present

## 2024-10-01 DIAGNOSIS — I130 Hypertensive heart and chronic kidney disease with heart failure and stage 1 through stage 4 chronic kidney disease, or unspecified chronic kidney disease: Secondary | ICD-10-CM | POA: Insufficient documentation

## 2024-10-01 DIAGNOSIS — M542 Cervicalgia: Secondary | ICD-10-CM | POA: Diagnosis present

## 2024-10-01 DIAGNOSIS — N189 Chronic kidney disease, unspecified: Secondary | ICD-10-CM | POA: Diagnosis not present

## 2024-10-01 DIAGNOSIS — F17210 Nicotine dependence, cigarettes, uncomplicated: Secondary | ICD-10-CM

## 2024-10-01 DIAGNOSIS — M50123 Cervical disc disorder at C6-C7 level with radiculopathy: Secondary | ICD-10-CM | POA: Insufficient documentation

## 2024-10-01 DIAGNOSIS — Z79899 Other long term (current) drug therapy: Secondary | ICD-10-CM | POA: Diagnosis not present

## 2024-10-01 DIAGNOSIS — I10 Essential (primary) hypertension: Secondary | ICD-10-CM

## 2024-10-01 DIAGNOSIS — Z981 Arthrodesis status: Principal | ICD-10-CM

## 2024-10-01 HISTORY — PX: ANTERIOR CERVICAL DECOMP/DISCECTOMY FUSION: SHX1161

## 2024-10-01 SURGERY — ANTERIOR CERVICAL DECOMPRESSION/DISCECTOMY FUSION 1 LEVEL
Anesthesia: General

## 2024-10-01 MED ORDER — ACETAMINOPHEN 650 MG RE SUPP: 650.0000 mg | RECTAL | Status: DC | PRN

## 2024-10-01 MED ORDER — TIZANIDINE HCL 4 MG PO TABS: 4.0000 mg | ORAL_TABLET | Freq: Three times a day (TID) | ORAL | Status: DC | PRN

## 2024-10-01 MED ORDER — GLYCOPYRROLATE 0.2 MG/ML IJ SOLN
INTRAMUSCULAR | Status: DC | PRN
  Administered 2024-10-01: .2 mg via INTRAVENOUS

## 2024-10-01 MED ORDER — CHLORHEXIDINE GLUCONATE 0.12 % MT SOLN
15.0000 mL | Freq: Once | OROMUCOSAL | Status: AC
  Administered 2024-10-01: 15 mL via OROMUCOSAL
  Filled 2024-10-01: qty 15

## 2024-10-01 MED ORDER — CHLORHEXIDINE GLUCONATE CLOTH 2 % EX PADS: 6.0000 | MEDICATED_PAD | Freq: Once | CUTANEOUS | Status: DC

## 2024-10-01 MED ORDER — ALBUTEROL SULFATE HFA 108 (90 BASE) MCG/ACT IN AERS: 2.0000 | INHALATION_SPRAY | Freq: Four times a day (QID) | RESPIRATORY_TRACT | Status: DC | PRN

## 2024-10-01 MED ORDER — OXYCODONE HCL 5 MG PO TABS: 5.0000 mg | ORAL_TABLET | Freq: Once | ORAL | Status: DC | PRN

## 2024-10-01 MED ORDER — DEXAMETHASONE 4 MG PO TABS
4.0000 mg | ORAL_TABLET | Freq: Four times a day (QID) | ORAL | Status: DC
  Administered 2024-10-01 – 2024-10-02 (×2): 4 mg via ORAL
  Filled 2024-10-01 (×2): qty 1

## 2024-10-01 MED ORDER — CLONIDINE HCL 0.1 MG PO TABS
0.1000 mg | ORAL_TABLET | Freq: Three times a day (TID) | ORAL | Status: DC
  Administered 2024-10-01 (×2): 0.1 mg via ORAL
  Filled 2024-10-01 (×3): qty 1

## 2024-10-01 MED ORDER — POTASSIUM CHLORIDE IN NACL 20-0.9 MEQ/L-% IV SOLN
INTRAVENOUS | Status: DC
  Filled 2024-10-01 (×2): qty 1000

## 2024-10-01 MED ORDER — SPIRONOLACTONE 25 MG PO TABS
25.0000 mg | ORAL_TABLET | Freq: Every day | ORAL | Status: DC
  Administered 2024-10-01: 25 mg via ORAL
  Filled 2024-10-01: qty 1

## 2024-10-01 MED ORDER — FENTANYL CITRATE (PF) 250 MCG/5ML IJ SOLN
INTRAMUSCULAR | Status: DC | PRN
  Administered 2024-10-01: 150 ug via INTRAVENOUS

## 2024-10-01 MED ORDER — THROMBIN 5000 UNITS EX KIT
PACK | CUTANEOUS | Status: AC
  Filled 2024-10-01: qty 1

## 2024-10-01 MED ORDER — ROCURONIUM BROMIDE 10 MG/ML (PF) SYRINGE
PREFILLED_SYRINGE | INTRAVENOUS | Status: AC
  Filled 2024-10-01: qty 10

## 2024-10-01 MED ORDER — ROCURONIUM BROMIDE 10 MG/ML (PF) SYRINGE
PREFILLED_SYRINGE | INTRAVENOUS | Status: DC | PRN
  Administered 2024-10-01: 20 mg via INTRAVENOUS
  Administered 2024-10-01: 10 mg via INTRAVENOUS
  Administered 2024-10-01: 90 mg via INTRAVENOUS

## 2024-10-01 MED ORDER — MIDAZOLAM HCL (PF) 2 MG/2ML IJ SOLN
INTRAMUSCULAR | Status: DC | PRN
  Administered 2024-10-01: 2 mg via INTRAVENOUS

## 2024-10-01 MED ORDER — GLYCOPYRROLATE PF 0.2 MG/ML IJ SOSY
PREFILLED_SYRINGE | INTRAMUSCULAR | Status: AC
  Filled 2024-10-01: qty 1

## 2024-10-01 MED ORDER — MORPHINE SULFATE (PF) 2 MG/ML IV SOLN: 2.0000 mg | INTRAVENOUS | Status: DC | PRN

## 2024-10-01 MED ORDER — HYDROCODONE-ACETAMINOPHEN 5-325 MG PO TABS
1.0000 | ORAL_TABLET | ORAL | Status: DC | PRN
  Administered 2024-10-01 – 2024-10-02 (×4): 1 via ORAL
  Filled 2024-10-01 (×4): qty 1

## 2024-10-01 MED ORDER — IRBESARTAN 75 MG PO TABS
37.5000 mg | ORAL_TABLET | Freq: Every day | ORAL | Status: DC
  Administered 2024-10-01: 37.5 mg via ORAL
  Filled 2024-10-01: qty 1

## 2024-10-01 MED ORDER — METOPROLOL SUCCINATE ER 25 MG PO TB24
ORAL_TABLET | ORAL | Status: AC
  Filled 2024-10-01: qty 1

## 2024-10-01 MED ORDER — ALBUTEROL SULFATE (2.5 MG/3ML) 0.083% IN NEBU: 2.5000 mg | INHALATION_SOLUTION | RESPIRATORY_TRACT | Status: DC | PRN

## 2024-10-01 MED ORDER — CEFAZOLIN SODIUM-DEXTROSE 3-4 GM/150ML-% IV SOLN
3.0000 g | INTRAVENOUS | Status: AC
  Administered 2024-10-01: 3 g via INTRAVENOUS
  Filled 2024-10-01: qty 150

## 2024-10-01 MED ORDER — ONDANSETRON HCL 4 MG/2ML IJ SOLN
INTRAMUSCULAR | Status: DC | PRN
  Administered 2024-10-01: 4 mg via INTRAVENOUS

## 2024-10-01 MED ORDER — PHENYLEPHRINE HCL-NACL 20-0.9 MG/250ML-% IV SOLN
INTRAVENOUS | Status: DC | PRN
  Administered 2024-10-01: 20 ug/min via INTRAVENOUS

## 2024-10-01 MED ORDER — LACTATED RINGERS IV SOLN: INTRAVENOUS | Status: DC

## 2024-10-01 MED ORDER — BUPIVACAINE HCL (PF) 0.25 % IJ SOLN
INTRAMUSCULAR | Status: DC | PRN
  Administered 2024-10-01: 7 mL

## 2024-10-01 MED ORDER — MIDAZOLAM HCL 2 MG/2ML IJ SOLN
INTRAMUSCULAR | Status: AC
  Filled 2024-10-01: qty 2

## 2024-10-01 MED ORDER — SODIUM CHLORIDE 0.9% FLUSH: 3.0000 mL | INTRAVENOUS | Status: DC | PRN

## 2024-10-01 MED ORDER — METOPROLOL SUCCINATE ER 25 MG PO TB24: 25.0000 mg | ORAL_TABLET | Freq: Every day | ORAL | Status: DC

## 2024-10-01 MED ORDER — EPHEDRINE 5 MG/ML INJ
INTRAVENOUS | Status: AC
  Filled 2024-10-01: qty 5

## 2024-10-01 MED ORDER — EPHEDRINE SULFATE-NACL 50-0.9 MG/10ML-% IV SOSY
PREFILLED_SYRINGE | INTRAVENOUS | Status: DC | PRN
  Administered 2024-10-01: 5 mg via INTRAVENOUS

## 2024-10-01 MED ORDER — DROPERIDOL 2.5 MG/ML IJ SOLN: 0.6250 mg | Freq: Once | INTRAMUSCULAR | Status: DC | PRN

## 2024-10-01 MED ORDER — CEFAZOLIN SODIUM-DEXTROSE 2-4 GM/100ML-% IV SOLN
2.0000 g | Freq: Three times a day (TID) | INTRAVENOUS | Status: AC
  Administered 2024-10-01 (×2): 2 g via INTRAVENOUS
  Filled 2024-10-01 (×2): qty 100

## 2024-10-01 MED ORDER — ISOSORB DINITRATE-HYDRALAZINE 20-37.5 MG PO TABS
2.0000 | ORAL_TABLET | Freq: Three times a day (TID) | ORAL | Status: DC
  Administered 2024-10-01 (×2): 2 via ORAL
  Filled 2024-10-01 (×3): qty 2

## 2024-10-01 MED ORDER — FENTANYL CITRATE (PF) 250 MCG/5ML IJ SOLN
INTRAMUSCULAR | Status: AC
  Filled 2024-10-01: qty 5

## 2024-10-01 MED ORDER — HEMOSTATIC AGENTS (NO CHARGE) OPTIME
TOPICAL | Status: DC | PRN
  Administered 2024-10-01: 1 via TOPICAL

## 2024-10-01 MED ORDER — DEXAMETHASONE SOD PHOSPHATE PF 10 MG/ML IJ SOLN
INTRAMUSCULAR | Status: DC | PRN
  Administered 2024-10-01: 10 mg via INTRAVENOUS

## 2024-10-01 MED ORDER — PROPOFOL 10 MG/ML IV BOLUS
INTRAVENOUS | Status: AC
  Filled 2024-10-01: qty 20

## 2024-10-01 MED ORDER — PROPOFOL 10 MG/ML IV BOLUS
INTRAVENOUS | Status: DC | PRN
  Administered 2024-10-01: 200 mg via INTRAVENOUS

## 2024-10-01 MED ORDER — DEXAMETHASONE SODIUM PHOSPHATE 4 MG/ML IJ SOLN
4.0000 mg | Freq: Four times a day (QID) | INTRAMUSCULAR | Status: DC
  Administered 2024-10-01: 4 mg via INTRAVENOUS
  Filled 2024-10-01: qty 1

## 2024-10-01 MED ORDER — METOPROLOL SUCCINATE ER 25 MG PO TB24
25.0000 mg | ORAL_TABLET | Freq: Once | ORAL | Status: AC
  Administered 2024-10-01: 25 mg via ORAL

## 2024-10-01 MED ORDER — SODIUM CHLORIDE 0.9 % IV SOLN: 250.0000 mL | INTRAVENOUS | Status: DC

## 2024-10-01 MED ORDER — ACETAMINOPHEN 500 MG PO TABS
1000.0000 mg | ORAL_TABLET | ORAL | Status: AC
  Administered 2024-10-01: 1000 mg via ORAL
  Filled 2024-10-01: qty 2

## 2024-10-01 MED ORDER — AMLODIPINE BESYLATE 10 MG PO TABS: 10.0000 mg | ORAL_TABLET | Freq: Every day | ORAL | Status: DC

## 2024-10-01 MED ORDER — HYDROMORPHONE HCL 1 MG/ML IJ SOLN
INTRAMUSCULAR | Status: AC
  Filled 2024-10-01: qty 1

## 2024-10-01 MED ORDER — ACETAMINOPHEN 10 MG/ML IV SOLN
INTRAVENOUS | Status: AC
  Filled 2024-10-01: qty 100

## 2024-10-01 MED ORDER — SENNA 8.6 MG PO TABS
1.0000 | ORAL_TABLET | Freq: Two times a day (BID) | ORAL | Status: DC
  Administered 2024-10-01 (×2): 8.6 mg via ORAL
  Filled 2024-10-01 (×2): qty 1

## 2024-10-01 MED ORDER — ORAL CARE MOUTH RINSE: 15.0000 mL | Freq: Once | OROMUCOSAL | Status: AC

## 2024-10-01 MED ORDER — BUPIVACAINE HCL (PF) 0.25 % IJ SOLN
INTRAMUSCULAR | Status: AC
  Filled 2024-10-01: qty 30

## 2024-10-01 MED ORDER — OXYCODONE HCL 5 MG/5ML PO SOLN: 5.0000 mg | Freq: Once | ORAL | Status: DC | PRN

## 2024-10-01 MED ORDER — SUGAMMADEX SODIUM 200 MG/2ML IV SOLN
INTRAVENOUS | Status: DC | PRN
  Administered 2024-10-01 (×2): 200 mg via INTRAVENOUS

## 2024-10-01 MED ORDER — ONDANSETRON HCL 4 MG/2ML IJ SOLN: 4.0000 mg | Freq: Four times a day (QID) | INTRAMUSCULAR | Status: DC | PRN

## 2024-10-01 MED ORDER — ONDANSETRON HCL 4 MG/2ML IJ SOLN: 4.0000 mg | Freq: Once | INTRAMUSCULAR | Status: DC | PRN

## 2024-10-01 MED ORDER — GABAPENTIN 300 MG PO CAPS
300.0000 mg | ORAL_CAPSULE | ORAL | Status: AC
  Administered 2024-10-01: 300 mg via ORAL
  Filled 2024-10-01: qty 1

## 2024-10-01 MED ORDER — PHENOL 1.4 % MT LIQD: 1.0000 | OROMUCOSAL | Status: DC | PRN

## 2024-10-01 MED ORDER — LIDOCAINE 2% (20 MG/ML) 5 ML SYRINGE
INTRAMUSCULAR | Status: AC
  Filled 2024-10-01: qty 5

## 2024-10-01 MED ORDER — 0.9 % SODIUM CHLORIDE (POUR BTL) OPTIME
TOPICAL | Status: DC | PRN
  Administered 2024-10-01: 1000 mL

## 2024-10-01 MED ORDER — HYDROMORPHONE HCL 1 MG/ML IJ SOLN
0.2500 mg | INTRAMUSCULAR | Status: DC | PRN
  Administered 2024-10-01: 0.5 mg via INTRAVENOUS

## 2024-10-01 MED ORDER — SODIUM CHLORIDE 0.9% FLUSH
3.0000 mL | Freq: Two times a day (BID) | INTRAVENOUS | Status: DC
  Administered 2024-10-01 (×2): 3 mL via INTRAVENOUS

## 2024-10-01 MED ORDER — THROMBIN 5000 UNITS EX SOLR
OROMUCOSAL | Status: DC | PRN
  Administered 2024-10-01: 5 mL via TOPICAL

## 2024-10-01 MED ORDER — ACETAMINOPHEN 325 MG PO TABS: 650.0000 mg | ORAL_TABLET | ORAL | Status: DC | PRN

## 2024-10-01 MED ORDER — ONDANSETRON HCL 4 MG/2ML IJ SOLN
INTRAMUSCULAR | Status: AC
  Filled 2024-10-01: qty 2

## 2024-10-01 MED ORDER — LIDOCAINE 2% (20 MG/ML) 5 ML SYRINGE
INTRAMUSCULAR | Status: DC | PRN
  Administered 2024-10-01: 80 mg via INTRAVENOUS

## 2024-10-01 MED ORDER — MENTHOL 3 MG MT LOZG
1.0000 | LOZENGE | OROMUCOSAL | Status: DC | PRN
  Administered 2024-10-02: 3 mg via ORAL
  Filled 2024-10-01: qty 9

## 2024-10-01 MED ORDER — ONDANSETRON HCL 4 MG PO TABS: 4.0000 mg | ORAL_TABLET | Freq: Four times a day (QID) | ORAL | Status: DC | PRN

## 2024-10-01 SURGICAL SUPPLY — 43 items
ALLOGRAFT BONE FIBER KORE 1CC (Bone Implant) IMPLANT
BAG COUNTER SPONGE SURGICOUNT (BAG) ×1 IMPLANT
BAND RUBBER 3X1/6 STRL (MISCELLANEOUS) ×2 IMPLANT
BASKET BONE COLLECTION (BASKET) ×1 IMPLANT
BENZOIN TINCTURE PRP APPL 2/3 (GAUZE/BANDAGES/DRESSINGS) ×1 IMPLANT
BIT DRILL SPS 12 (BIT) IMPLANT
BUR CARBIDE MATCH 3.0 (BURR) ×1 IMPLANT
CANISTER SUCTION 3000ML PPV (SUCTIONS) ×1 IMPLANT
CLSR STERI-STRIP ANTIMIC 1/2X4 (GAUZE/BANDAGES/DRESSINGS) IMPLANT
DRAPE C-ARM 42X72 X-RAY (DRAPES) ×2 IMPLANT
DRAPE LAPAROTOMY 100X72 PEDS (DRAPES) ×1 IMPLANT
DRAPE MICROSCOPE LEICA (MISCELLANEOUS) IMPLANT
DRSG OPSITE POSTOP 4X6 (GAUZE/BANDAGES/DRESSINGS) IMPLANT
DURAPREP 6ML APPLICATOR 50/CS (WOUND CARE) ×1 IMPLANT
ELECT COATED BLADE 2.86 ST (ELECTRODE) ×1 IMPLANT
ELECTRODE REM PT RTRN 9FT ADLT (ELECTROSURGICAL) ×1 IMPLANT
GAUZE 4X4 16PLY ~~LOC~~+RFID DBL (SPONGE) IMPLANT
GLOVE BIO SURGEON STRL SZ7 (GLOVE) ×1 IMPLANT
GLOVE BIO SURGEON STRL SZ8 (GLOVE) ×1 IMPLANT
GLOVE BIOGEL PI IND STRL 7.0 (GLOVE) ×1 IMPLANT
GOWN STRL REUS W/ TWL LRG LVL3 (GOWN DISPOSABLE) ×1 IMPLANT
GOWN STRL REUS W/ TWL XL LVL3 (GOWN DISPOSABLE) ×1 IMPLANT
GOWN STRL REUS W/TWL 2XL LVL3 (GOWN DISPOSABLE) IMPLANT
HEMOSTAT POWDER KIT SURGIFOAM (HEMOSTASIS) ×1 IMPLANT
KIT BASIN OR (CUSTOM PROCEDURE TRAY) ×1 IMPLANT
KIT TURNOVER KIT B (KITS) ×1 IMPLANT
NEEDLE HYPO 25X1 1.5 SAFETY (NEEDLE) ×1 IMPLANT
NEEDLE SPNL 20GX3.5 QUINCKE YW (NEEDLE) ×1 IMPLANT
PACK LAMINECTOMY NEURO (CUSTOM PROCEDURE TRAY) ×1 IMPLANT
PAD ARMBOARD POSITIONER FOAM (MISCELLANEOUS) ×1 IMPLANT
PIN DISTRACTION 14MM (PIN) ×2 IMPLANT
PLATE ACP CONT 16 4H (Plate) IMPLANT
SCREW SINGLE LEAD 3.5X16 ST (Screw) IMPLANT
SOLN 0.9% NACL POUR BTL 1000ML (IV SOLUTION) ×1 IMPLANT
SOLN STERILE WATER BTL 1000 ML (IV SOLUTION) ×1 IMPLANT
SPACER IDENTITI 8X18X16 7D (Spacer) IMPLANT
SPONGE INTESTINAL PEANUT (DISPOSABLE) ×1 IMPLANT
SPONGE SURGIFOAM ABS GEL SZ50 (HEMOSTASIS) IMPLANT
STRIP CLOSURE SKIN 1/2X4 (GAUZE/BANDAGES/DRESSINGS) ×1 IMPLANT
SUT VIC AB 3-0 SH 8-18 (SUTURE) ×1 IMPLANT
SUT VIC AB 4-0 PS2 18 (SUTURE) IMPLANT
TOWEL GREEN STERILE (TOWEL DISPOSABLE) ×1 IMPLANT
TOWEL GREEN STERILE FF (TOWEL DISPOSABLE) ×1 IMPLANT

## 2024-10-01 NOTE — Anesthesia Procedure Notes (Signed)
 Arterial Line Insertion Start/End1/01/2025 7:10 AM Performed by: Jerrye Sharper, MD, Alen Bernardino BIRCH, CRNA, CRNA  Patient location: Pre-op. Preanesthetic checklist: patient identified, IV checked, site marked, risks and benefits discussed, surgical consent, monitors and equipment checked, pre-op evaluation, timeout performed and anesthesia consent Lidocaine  1% used for infiltration Left, radial was placed Catheter size: 20 G Hand hygiene performed  and maximum sterile barriers used   Attempts: 2 Following insertion, dressing applied. Post procedure assessment: normal and unchanged  Patient tolerated the procedure well with no immediate complications.

## 2024-10-01 NOTE — Anesthesia Postprocedure Evaluation (Signed)
"   Anesthesia Post Note  Patient: Calvin Bailey  Procedure(s) Performed: ANTERIOR CERVICAL DECOMPRESSION/DISCECTOMY FUSION CERVICAL SIX-SEVEN     Patient location during evaluation: PACU Anesthesia Type: General Level of consciousness: awake and alert and oriented Pain management: pain level controlled Vital Signs Assessment: post-procedure vital signs reviewed and stable Respiratory status: spontaneous breathing, nonlabored ventilation and respiratory function stable Cardiovascular status: blood pressure returned to baseline and stable Postop Assessment: no apparent nausea or vomiting Anesthetic complications: no   There were no known notable events for this encounter.  Last Vitals:  Vitals:   10/01/24 1045 10/01/24 1100  BP: (!) 148/77 (!) 148/86  Pulse: 64 65  Resp: 16 16  Temp:    SpO2: 94% 92%    Last Pain:  Vitals:   10/01/24 1054  TempSrc:   PainSc: 7                  [name] A.      "

## 2024-10-01 NOTE — Transfer of Care (Signed)
 Immediate Anesthesia Transfer of Care Note  Patient: Calvin Bailey  Procedure(s) Performed: ANTERIOR CERVICAL DECOMPRESSION/DISCECTOMY FUSION CERVICAL SIX-SEVEN  Patient Location: PACU  Anesthesia Type:General  Level of Consciousness: awake, alert , and oriented  Airway & Oxygen Therapy: Patient Spontanous Breathing and Patient connected to face mask oxygen  Post-op Assessment: Report given to RN and Post -op Vital signs reviewed and stable  Post vital signs: Reviewed and stable  Last Vitals:  Vitals Value Taken Time  BP 133/73 10/01/24 10:15  Temp 36.8 C 10/01/24 10:15  Pulse 68 10/01/24 10:18  Resp 17 10/01/24 10:18  SpO2 99 % 10/01/24 10:18  Vitals shown include unfiled device data.  Last Pain:  Vitals:   10/01/24 1015  TempSrc:   PainSc: Asleep         Complications: There were no known notable events for this encounter.

## 2024-10-01 NOTE — Anesthesia Procedure Notes (Signed)
 Procedure Name: Intubation Date/Time: 10/01/2024 8:11 AM  Performed by: Alen Motto D, CRNAPre-anesthesia Checklist: Patient identified, Emergency Drugs available, Suction available and Patient being monitored Patient Re-evaluated:Patient Re-evaluated prior to induction Oxygen Delivery Method: Circle System Utilized Preoxygenation: Pre-oxygenation with 100% oxygen Induction Type: IV induction Ventilation: Mask ventilation without difficulty Laryngoscope Size: Glidescope and 4 Grade View: Grade I Tube type: Oral Tube size: 7.5 mm Number of attempts: 1 Airway Equipment and Method: Stylet and Oral airway Placement Confirmation: ETT inserted through vocal cords under direct vision, positive ETCO2 and breath sounds checked- equal and bilateral Secured at: 23 cm Tube secured with: Tape Dental Injury: Teeth and Oropharynx as per pre-operative assessment

## 2024-10-01 NOTE — H&P (Signed)
 Subjective:   Patient is a 44 y.o. male admitted for neck and R arm pain. The patient first presented to me with complaints of neck pain, shooting pains in the arm(s), numbness of the arm(s), and loss of strength of the arm(s). Onset of symptoms was several months ago. The pain is described as aching and stabbing and occurs all day. The pain is rated severe, and is located in the neck and radiates to the RUE. The symptoms have been progressive. Symptoms are exacerbated by extending head backwards, and are relieved by none.  Previous work up includes MRI of cervical spine, results: spinal stenosis.  Past Medical History:  Diagnosis Date   Arthritis    Cancer (HCC)    right kidney   CHF (congestive heart failure) (HCC)    pt. 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    Past Surgical History:  Procedure Laterality Date   CARDIAC CATHETERIZATION     CORONARY/GRAFT ACUTE MI REVASCULARIZATION N/A 06/11/2024   Procedure: Coronary/Graft Acute MI Revascularization;  Surgeon: Wonda Sharper, MD;  Location: [facility] INVASIVE CV LAB;  Service: Cardiovascular;  Laterality: N/A;   IR RADIOLOGIST EVAL & MGMT  03/07/2024   LEFT HEART CATH AND CORONARY ANGIOGRAPHY N/A 06/11/2024   Procedure: LEFT HEART CATH AND CORONARY ANGIOGRAPHY;  Surgeon: Wonda Sharper, MD;  Location: [facility] INVASIVE CV LAB;  Service: Cardiovascular;  Laterality: N/A;   RADIOFREQUENCY ABLATION KIDNEY Right    RADIOLOGY WITH ANESTHESIA Right 02/16/2023   Procedure: RIGHT RENAL MASS CRYO ABLATION;  Surgeon: Philip Cornet, MD;  Location: [facility] ORS;  Service: Anesthesiology;  Laterality: Right;   RIGHT/LEFT HEART CATH AND CORONARY ANGIOGRAPHY  Bilateral 05/18/2024   Procedure: RIGHT/LEFT HEART CATH AND CORONARY ANGIOGRAPHY;  Surgeon: Mady Bruckner, MD;  Location: [facility] INVASIVE CV LAB;  Service: Cardiovascular;  Laterality: Bilateral;   two knee surgeries Left 09/27/1993    Allergies[1]  Social History   Tobacco Use   Smoking status: Every Day    Current packs/day: 0.25    Average packs/day: 0.1 packs/day for 18.3 years (1.9 ttl pk-yrs)    Types: Cigarettes    Start date: 06/08/2006    Passive exposure: Current   Smokeless tobacco: Never   Tobacco comments:    02/01/24 patient smokes 3-6 cigarettes daily    09/04/24 now smoke 2-3 cigs a day  Substance Use Topics   Alcohol use: Yes    Comment: sparingly     Family History  Problem Relation Age of Onset   Hypertension Mother    Multiple sclerosis Mother    Hypertension Father    Hypertension Brother    Hyperlipidemia Paternal Grandfather    Heart failure Paternal Grandfather    Diabetes Maternal Grandmother    Heart failure Maternal Grandfather    CVA Maternal Uncle    Prior to Admission medications  Medication Sig Start Date End Date Taking? Authorizing Provider  acetaminophen  (TYLENOL ) 500 MG tablet Take 1 tablet (500 mg total) by mouth every 6 (six) hours as needed. Patient taking differently: Take 500 mg by mouth every 6 (six) hours as needed for mild pain (pain score 1-3), moderate pain (pain score 4-6), fever or  headache. 05/20/23  Yes Horton, Charmaine FALCON, MD  amLODipine  (NORVASC ) 10 MG tablet Take 1 tablet (10 mg total) by mouth daily. 06/14/24  Yes Ladona Heinz, MD  amoxicillin  (AMOXIL ) 500 MG capsule Take 500 mg by mouth 2 (two) times daily. 09/18/24  Yes [provider]  cloNIDine  (CATAPRES ) 0.1 MG tablet Take 1 tablet (0.1 mg total) by mouth 3 (three) times daily. 09/18/24  Yes Hammock, Tylene, NP  empagliflozin  (JARDIANCE ) 10 MG TABS tablet Take 1 tablet (10 mg total) by mouth daily before breakfast. 04/05/24  Yes Hammock, Sheri, NP   isosorbide -hydrALAZINE  (BIDIL ) 20-37.5 MG tablet Take 2 tablets by mouth 3 (three) times daily. 09/25/24 09/25/25 Yes Tobb, Kardie, DO  metoprolol  succinate (TOPROL  XL) 25 MG 24 hr tablet Take 1 tablet (25 mg total) by mouth daily. 08/30/24 08/30/25 Yes Hammock, Sheri, NP  nicotine  (NICODERM CQ  - DOSED IN MG/24 HOURS) 14 mg/24hr patch Place 1 patch (14 mg total) onto the skin daily. 06/14/24  Yes Ladona Heinz, MD  nitroGLYCERIN  (NITROSTAT ) 0.4 MG SL tablet Place 1 tablet (0.4 mg total) under the tongue every 5 (five) minutes as needed for chest pain. 05/18/24  Yes End, Lonni, MD  spironolactone  (ALDACTONE ) 25 MG tablet Take 1 tablet (25 mg total) by mouth once for 1 dose. Patient taking differently: Take 25 mg by mouth daily. 07/30/24 10/01/24 Yes de [place], [name], MD  tirzepatide  (ZEPBOUND ) 5 MG/0.5ML injection vial Inject 5 mg into the skin once a week.   Yes [provider]  tiZANidine  (ZANAFLEX ) 4 MG tablet Take 4 mg by mouth every 8 (eight) hours as needed for muscle spasms. 07/01/24  Yes [provider]  valsartan  (DIOVAN ) 160 MG tablet Take 1 tablet (160 mg total) by mouth 2 (two) times daily. 07/17/24  Yes Tobb, Kardie, DO  albuterol  (VENTOLIN  HFA) 108 (90 Base) MCG/ACT inhaler Inhale 2 puffs into the lungs every 6 (six) hours as needed for wheezing or shortness of breath. 05/29/24   Kasa, Kurian, MD  chlorthalidone  (HYGROTON ) 25 MG tablet Take 12.5 mg by mouth daily. Patient not taking: Reported on 09/24/2024 06/29/24   [provider]  furosemide  (LASIX ) 20 MG tablet Take 1 tablet (20 mg total) by mouth daily as needed for fluid or edema. Patient not taking: Reported on 09/24/2024 05/18/24   End, Lonni, MD  ibuprofen  (ADVIL ) 600 MG tablet Take 600 mg by mouth 3 (three) times daily as needed for mild pain (pain score 1-3), moderate pain (pain score 4-6), headache or fever. 09/18/24   [provider]  tirzepatide  (ZEPBOUND ) 7.5 MG/0.5ML Pen Inject 7.5 mg  into the skin once a week. Patient not taking: Reported on 09/24/2024 09/18/24   Tobb, Kardie, DO  LISINOPRIL -HYDROCHLOROTHIAZIDE  PO Take by mouth.  07/06/19  [provider]     Review of Systems  Positive ROS: neg  All other systems have been reviewed and were otherwise negative with the exception of those mentioned in the HPI and as above.  Objective: Vital signs in last 24 hours: Temp:  [98.4 F (36.9 C)] 98.4 F (36.9 C) (01/05 0549) Pulse Rate:  [67] 67 (01/05 0549) Resp:  [20] 20 (01/05 0549) BP: (125)/(67) 125/67 (01/05 0549) SpO2:  [93 %] 93 % (01/05 0549) Weight:  [155.6 kg-158.8 kg] 155.6 kg (01/05 0607)  General Appearance: Alert, cooperative, no distress, appears stated age Head: Normocephalic, without obvious abnormality, atraumatic Eyes: PERRL, conjunctiva/corneas clear, EOM's intact      Neck: Supple, symmetrical, trachea midline, Back:  Symmetric, no curvature, ROM normal, no CVA tenderness Lungs:  respirations unlabored Heart: Regular rate and rhythm Abdomen: Soft, non-tender Extremities: Extremities normal, atraumatic, no cyanosis or edema Pulses: 2+ and symmetric all extremities Skin: Skin color, texture, turgor normal, no rashes or lesions  NEUROLOGIC:  Mental status: Alert and oriented x4, no aphasia, good attention span, fund of knowledge and memory  Motor Exam - grossly normal except R tricep 4-/5, grip 4/5 Sensory Exam - grossly normal Reflexes: 1+ Coordination - grossly normal Gait - grossly normal Balance - grossly normal Cranial Nerves: I: smell Not tested  II: visual acuity  OS: nl    OD: nl  II: visual fields Full to confrontation  II: pupils Equal, round, reactive to light  III,VII: ptosis None  III,IV,VI: extraocular muscles  Full ROM  V: mastication Normal  V: facial light touch sensation  Normal  V,VII: corneal reflex  Present  VII: facial muscle function - upper  Normal  VII: facial muscle function - lower Normal  VIII:  hearing Not tested  IX: soft palate elevation  Normal  IX,X: gag reflex Present  XI: trapezius strength  5/5  XI: sternocleidomastoid strength 5/5  XI: neck flexion strength  5/5  XII: tongue strength  Normal    Data Review Lab Results  Component Value Date   WBC 8.9 09/24/2024   HGB 14.1 09/24/2024   HCT 43.5 09/24/2024   MCV 84.1 09/24/2024   PLT 305 09/24/2024   Lab Results  Component Value Date   NA 138 09/24/2024   K 4.1 09/24/2024   CL 105 09/24/2024   CO2 24 09/24/2024   BUN 25 (H) 09/24/2024   CREATININE 1.82 (H) 09/24/2024   GLUCOSE 95 09/24/2024   Lab Results  Component Value Date   INR 1.0 06/11/2024    Assessment:   Cervical neck pain with herniated nucleus pulposus/ spondylosis/ stenosis at C6-7. Estimated body mass index is 45.25 kg/m as calculated from the following:   Height as of this encounter: 6' 1 (1.854 m).   Weight as of this encounter: 155.6 kg.  Patient has failed conservative therapy. Planned surgery : ACDF C6-7  Plan:   I explained the condition and procedure to the patient and answered any questions.  Patient wishes to proceed with procedure as planned. Understands risks/ benefits/ and expected or typical outcomes.  Calvin Bailey 10/01/2024 7:50 AM      [1]  Allergies Allergen Reactions   Hydrochlorothiazide  Other (See Comments)    Erectile dysfunction    Lisinopril  Cough    Coughing up blood

## 2024-10-01 NOTE — Op Note (Signed)
 10/01/2024  10:09 AM  PATIENT:  Calvin Bailey  44 y.o. male  PRE-OPERATIVE DIAGNOSIS: Cervical spondylosis with cervical disc herniation and cervical spinal stenosis C6-7 with right C7 radiculopathy with arm weakness  POST-OPERATIVE DIAGNOSIS:  same  PROCEDURE:  1. Decompressive anterior cervical discectomy C6-7, 2. Anterior cervical arthrodesis C6-7 utilizing a force titanium interbody cage packed with locally harvested morcellized autologous bone graft and DBM fibers, 3. Anterior cervical plating C6-7 utilizing a ATEC plate  SURGEON:  Alm Molt, MD  ASSISTANTS: Meyran FNP  ANESTHESIA:   General  EBL: 25 ml  No intake/output data recorded.  BLOOD ADMINISTERED: none  DRAINS: none  SPECIMEN:  none  INDICATION FOR PROCEDURE: This patient presented with neck pain and right arm pain with numbness and weakness in the tricep. Imaging showed cervical stenosis with disc herniation C6-7. The patient tried conservative measures without relief. Pain was debilitating. Recommended ACDF with plating. Patient understood the risks, benefits, and alternatives and potential outcomes and wished to proceed.  PROCEDURE DETAILS: Patient was brought to the operating room placed under general endotracheal anesthesia. Patient was placed in the supine position on the operating room table. The neck was prepped with Duraprep and draped in a sterile fashion.   Three cc of local anesthesia was injected and a transverse incision was made on the right side of the neck.  Dissection was carried down thru the subcutaneous tissue and the platysma was  elevated, opened, and undermined with Metzenbaum scissors.  Dissection was then carried out thru an avascular plane leaving the sternocleidomastoid carotid artery and jugular vein laterally and the trachea and esophagus medially with the assistance of my nurse practitioner. The ventral aspect of the vertebral column was identified and a localizing x-ray was taken. The  C5-6 level was identified and all in the room agreed with the level.  Because of his body habitus we could not really C67 on lateral fluoroscopy but then we counted down 1 level from the disc base with the a bent spinal needle and localized C6-7.  The longus colli muscles were then elevated and the retractor was placed with the assistance of my nurse practitioner. The annulus was incised and the disc space entered. Discectomy was performed with micro-curettes and pituitary rongeurs. I then used the high-speed drill to drill the endplates down to the level of the posterior longitudinal ligament. The drill shavings were saved in a mucous trap for later arthrodesis. The operating microscope was draped and brought into the field provided additional magnification, illumination and visualization. Discectomy was continued posteriorly thru the disc space. Posterior longitudinal ligament was opened with a nerve hook, and then removed along with disc herniation and osteophytes, decompressing the spinal canal and thecal sac. We then continued to remove osteophytic overgrowth and disc material decompressing the neural foramina and exiting nerve roots bilaterally. The scope was angled up and down to help decompress and undercut the vertebral bodies. Once the decompression was completed we could pass a nerve hook circumferentially to assure adequate decompression in the midline and in the neural foramina. So by both visualization and palpation we felt we had an adequate decompression of the neural elements. We then measured the height of the intravertebral disc space and selected a 8 millimeter porous titanium interbody cage packed with autograft and DBM fibers. It was then gently positioned in the intravertebral disc space(s) and countersunk. I then used a 15 mm plate and placed 14 mm variable angle screws into the vertebral bodies of each level  and locked them into position. The wound was irrigated with bacitracin solution,  checked for hemostasis which was established and confirmed. Once meticulous hemostasis was achieved, we then proceeded with closure with the assistance of my nurse practitioner. The platysma was closed with interrupted 3-0 undyed Vicryl suture, the subcuticular layer was closed with interrupted 3-0 undyed Vicryl suture. The skin edges were approximated with steristrips. The drapes were removed. A sterile dressing was applied. The patient was then awakened from general anesthesia and transferred to the recovery room in stable condition. At the end of the procedure all sponge, needle and instrument counts were correct.   PLAN OF CARE: Admit for overnight observation  PATIENT DISPOSITION:  PACU - hemodynamically stable.   Delay start of Pharmacological VTE agent (>24hrs) due to surgical blood loss or risk of bleeding:  yes

## 2024-10-02 ENCOUNTER — Other Ambulatory Visit (HOSPITAL_COMMUNITY): Payer: Self-pay

## 2024-10-02 DIAGNOSIS — M4802 Spinal stenosis, cervical region: Secondary | ICD-10-CM | POA: Diagnosis not present

## 2024-10-02 MED ORDER — MUPIROCIN 2 % EX OINT: TOPICAL_OINTMENT | Freq: Two times a day (BID) | CUTANEOUS | Status: DC

## 2024-10-02 MED ORDER — HYDROCODONE-ACETAMINOPHEN 5-325 MG PO TABS
1.0000 | ORAL_TABLET | ORAL | 0 refills | Status: AC | PRN
  Filled 2024-10-02: qty 30, 5d supply, fill #0

## 2024-10-02 NOTE — Plan of Care (Signed)
Pt doing well. Pt given D/C instructions with verbal understanding. Rx was sent to the pharmacy by MD. Pt's incision is clean and dry with no sign of infection. Pt's IV was removed prior to D/C. Pt D/C'd home via wheelchair per MD order. Pt is stable @ D/C and has no other needs at this time. Berton Butrick, RN  

## 2024-10-02 NOTE — Discharge Summary (Signed)
 " Physician Discharge Summary  Patient ID: Calvin Bailey MRN: [id number] DOB/AGE: Mar 11, 1981 44 y.o.  Admit date: 10/01/2024 Discharge date: 10/02/2024  Admission Diagnoses: cervical stenosis with radiculopathy    Discharge Diagnoses: same   Discharged Condition: good  Hospital Course: The patient was admitted on 10/01/2024 and taken to the operating room where the patient underwent ACDF C6-7. The patient tolerated the procedure well and was taken to the recovery room and then to the floor in stable condition. The hospital course was routine. There were no complications. The wound remained clean dry and intact. Pt had appropriate neck soreness. No complaints of arm pain or new N/T/W. The patient remained afebrile with stable vital signs, and tolerated a regular diet. The patient continued to increase activities, and pain was well controlled with oral pain medications.   Consults: None  Significant Diagnostic Studies:  Results for orders placed or performed during the hospital encounter of 09/24/24  Surgical pcr screen   Collection Time: 09/24/24 11:12 AM   Specimen: Nasal Mucosa; Nasal Swab  Result Value Ref Range   MRSA, PCR NEGATIVE NEGATIVE   Staphylococcus aureus POSITIVE (A) NEGATIVE  Basic metabolic panel per protocol   Collection Time: 09/24/24 11:12 AM  Result Value Ref Range   Sodium 138 135 - 145 mmol/L   Potassium 4.1 3.5 - 5.1 mmol/L   Chloride 105 98 - 111 mmol/L   CO2 24 22 - 32 mmol/L   Glucose, Bld 95 70 - 99 mg/dL   BUN 25 (H) 6 - 20 mg/dL   Creatinine, Ser 8.17 (H) 0.61 - 1.24 mg/dL   Calcium  9.2 8.9 - 10.3 mg/dL   GFR, Estimated 46 (L) >60 mL/min   Anion gap 9 5 - 15  CBC per protocol   Collection Time: 09/24/24 11:12 AM  Result Value Ref Range   WBC 8.9 4.0 - 10.5 K/uL   RBC 5.17 4.22 - 5.81 MIL/uL   Hemoglobin 14.1 13.0 - 17.0 g/dL   HCT 56.4 60.9 - 47.9 %   MCV 84.1 80.0 - 100.0 fL   MCH 27.3 26.0 - 34.0 pg   MCHC 32.4 30.0 - 36.0 g/dL   RDW 85.9  88.4 - 84.4 %   Platelets 305 150 - 400 K/uL   nRBC 0.0 0.0 - 0.2 %    DG Cervical Spine 1 View Result Date: 10/01/2024 CLINICAL DATA:  Anterior cervical discectomy and fusion at the C6-7 level. EXAM: DG CERVICAL SPINE - 1 VIEW COMPARISON:  Radiographs dated 02/24/2024 and cervical spine CT dated 02/18/2024. FINDINGS: Three lateral C-arm images of the cervical spine there are submitted for interpretation. May hypopharyngeal temperature probe is noted. There is interbody and anterior screw and plate fusion at a single level on the last image, obscured by the patient's shoulders. The level can not be determined on these images. IMPRESSION: Anterior cervical discectomy and fusion at a single level on the last image, obscured by the patient's shoulders. The level can not be determined on these images. Electronically Signed   By: Elspeth Bathe M.D.   On: 10/01/2024 18:17   DG C-Arm 1-60 Min-No Report Result Date: 10/01/2024 Fluoroscopy was utilized by the requesting physician.  No radiographic interpretation.   DG C-Arm 1-60 Min-No Report Result Date: 10/01/2024 Fluoroscopy was utilized by the requesting physician.  No radiographic interpretation.    Antibiotics:  Anti-infectives (From admission, onward)    Start     Dose/Rate Route Frequency Ordered Stop   10/01/24 1600  ceFAZolin  (  ANCEF ) IVPB 2g/100 mL premix        2 g 200 mL/hr over 30 Minutes Intravenous Every 8 hours 10/01/24 1144 10/01/24 2319   10/01/24 0600  ceFAZolin  (ANCEF ) IVPB 3g/150 mL premix        3 g 300 mL/hr over 30 Minutes Intravenous On call to O.R. 10/01/24 0540 10/01/24 0826       Discharge Exam: Blood pressure (!) 151/98, pulse 61, temperature 98.7 F (37.1 C), temperature source Oral, resp. rate 18, height 6' 1 (1.854 m), weight (!) 155.6 kg, SpO2 98%. Neurologic: Grossly normal Dressing dry  Discharge Medications:   Allergies as of 10/02/2024       Reactions   Hydrochlorothiazide  Other (See Comments)   Erectile  dysfunction    Lisinopril  Cough   Coughing up blood        Medication List     TAKE these medications    acetaminophen  500 MG tablet Commonly known as: TYLENOL  Take 1 tablet (500 mg total) by mouth every 6 (six) hours as needed. What changed: reasons to take this   albuterol  108 (90 Base) MCG/ACT inhaler Commonly known as: VENTOLIN  HFA Inhale 2 puffs into the lungs every 6 (six) hours as needed for wheezing or shortness of breath.   amLODipine  10 MG tablet Commonly known as: NORVASC  Take 1 tablet (10 mg total) by mouth daily.   amoxicillin  500 MG capsule Commonly known as: AMOXIL  Take 500 mg by mouth 2 (two) times daily.   chlorthalidone  25 MG tablet Commonly known as: HYGROTON  Take 12.5 mg by mouth daily.   cloNIDine  0.1 MG tablet Commonly known as: CATAPRES  Take 1 tablet (0.1 mg total) by mouth 3 (three) times daily.   furosemide  20 MG tablet Commonly known as: LASIX  Take 1 tablet (20 mg total) by mouth daily as needed for fluid or edema.   HYDROcodone -acetaminophen  5-325 MG tablet Commonly known as: NORCO/VICODIN Take 1 tablet by mouth every 4 (four) hours as needed for moderate pain (pain score 4-6).   ibuprofen  600 MG tablet Commonly known as: ADVIL  Take 600 mg by mouth 3 (three) times daily as needed for mild pain (pain score 1-3), moderate pain (pain score 4-6), headache or fever.   isosorbide -hydrALAZINE  20-37.5 MG tablet Commonly known as: BIDIL  Take 2 tablets by mouth 3 (three) times daily.   Jardiance  10 MG Tabs tablet Generic drug: empagliflozin  Take 1 tablet (10 mg total) by mouth daily before breakfast.   metoprolol  succinate 25 MG 24 hr tablet Commonly known as: Toprol  XL Take 1 tablet (25 mg total) by mouth daily.   nicotine  14 mg/24hr patch Commonly known as: NICODERM CQ  - dosed in mg/24 hours Place 1 patch (14 mg total) onto the skin daily.   nitroGLYCERIN  0.4 MG SL tablet Commonly known as: Nitrostat  Place 1 tablet (0.4 mg total)  under the tongue every 5 (five) minutes as needed for chest pain.   spironolactone  25 MG tablet Commonly known as: ALDACTONE  Take 1 tablet (25 mg total) by mouth once for 1 dose. What changed: when to take this   tiZANidine  4 MG tablet Commonly known as: ZANAFLEX  Take 4 mg by mouth every 8 (eight) hours as needed for muscle spasms.   valsartan  160 MG tablet Commonly known as: DIOVAN  Take 1 tablet (160 mg total) by mouth 2 (two) times daily.   Zepbound  5 MG/0.5ML injection vial Generic drug: tirzepatide  Inject 5 mg into the skin once a week.   Zepbound  7.5 MG/0.5ML Pen Generic drug:  tirzepatide  Inject 7.5 mg into the skin once a week.        Disposition: home   Final Dx: ACDF C6-7  Discharge Instructions     Call MD for:  difficulty breathing, headache or visual disturbances   Complete by: As directed    Call MD for:  persistant nausea and vomiting   Complete by: As directed    Call MD for:  redness, tenderness, or signs of infection (pain, swelling, redness, odor or green/yellow discharge around incision site)   Complete by: As directed    Call MD for:  severe uncontrolled pain   Complete by: As directed    Call MD for:  temperature >100.4   Complete by: As directed    Increase activity slowly   Complete by: As directed    Remove dressing in 48 hours   Complete by: As directed           Signed: Alm GORMAN Molt 10/02/2024, 8:17 AM   "

## 2024-10-02 NOTE — Evaluation (Signed)
 Occupational Therapy Evaluation Patient Details Name: Calvin Bailey MRN: [id number] DOB: 05/10/1981 Today's Date: 10/02/2024   History of Present Illness   Pt is a 44 y.o. male s/p ACDF C6-7. PMH: arthritis, R kidney CA, CHF, CKD, GERD, HTN, obesity hypoventilation syndrome, OSA, patent foramen ovale, PNA, spontaneous dissection of coronary artery (2025)     Clinical Impressions PTA, pt lived alone and was independent. Upon eval, pt mod I for BADL. Pt educated and demonstrating compensatory techniques for bed mobility, UB ADL, LB ADL,, toileting, shower transfers, stair training, and grooming within precautions. Reviewed recommendations for mobility to reduce inflammation and optimize healing. All education provided and questions answered. Recommending discharge home with No OT follow up at this time. OT to sign off. Please re-consult if change in status.       If plan is discharge home, recommend the following:   Other (comment) (on pt request)     Functional Status Assessment   Patient has had a recent decline in their functional status and demonstrates the ability to make significant improvements in function in a reasonable and predictable amount of time.     Equipment Recommendations   None recommended by OT     Recommendations for Other Services         Precautions/Restrictions   Precautions Precautions: Cervical Precaution Booklet Issued: Yes (comment) Recall of Precautions/Restrictions: Intact Precaution/Restrictions Comments: all precautiuons reviewed within the context of ADL Required Braces or Orthoses:  (no brace needed orders) Restrictions Weight Bearing Restrictions Per Provider Order: No     Mobility Bed Mobility Overal bed mobility: Independent                  Transfers Overall transfer level: Independent                        Balance Overall balance assessment: Modified Independent                                          ADL either performed or assessed with clinical judgement   ADL Overall ADL's : Independent                                             Vision Baseline Vision/History: 0 No visual deficits Patient Visual Report: No change from baseline       Perception         Praxis         Pertinent Vitals/Pain Pain Assessment Pain Assessment: Faces Faces Pain Scale: Hurts a little bit Pain Location: surgical site Pain Descriptors / Indicators: Discomfort Pain Intervention(s): Monitored during session, Limited activity within patient's tolerance     Extremity/Trunk Assessment Upper Extremity Assessment Upper Extremity Assessment: Left hand dominant;RUE deficits/detail RUE Deficits / Details: generally weaker than L, not formally assessed via MMT, however uses functionally   Lower Extremity Assessment Lower Extremity Assessment: Overall WFL for tasks assessed   Cervical / Trunk Assessment Cervical / Trunk Assessment: Neck Surgery   Communication Communication Communication: No apparent difficulties   Cognition Arousal: Alert Behavior During Therapy: WFL for tasks assessed/performed Cognition: No apparent impairments  Following commands: Intact       Cueing  General Comments          Exercises     Shoulder Instructions      Home Living Family/patient expects to be discharged to:: Private residence Living Arrangements: Alone Available Help at Discharge: Family;Available PRN/intermittently Type of Home: Other(Comment) (town home) Home Access: Level entry     Home Layout: Two level;Bed/bath upstairs     Bathroom Shower/Tub: Chief Strategy Officer: Standard     Home Equipment: Shower seat;Hand held shower head          Prior Functioning/Environment Prior Level of Function : Independent/Modified Independent             Mobility Comments: no AD ADLs  Comments: independent in ADL and IADL    OT Problem List: Decreased strength;Decreased knowledge of precautions   OT Treatment/Interventions:        OT Goals(Current goals can be found in the care plan section)   Acute Rehab OT Goals Patient Stated Goal: go home now OT Goal Formulation: With patient   OT Frequency:       Co-evaluation              AM-PAC OT 6 Clicks Daily Activity     Outcome Measure Help from another person eating meals?: None Help from another person taking care of personal grooming?: None Help from another person toileting, which includes using toliet, bedpan, or urinal?: None Help from another person bathing (including washing, rinsing, drying)?: None Help from another person to put on and taking off regular upper body clothing?: None Help from another person to put on and taking off regular lower body clothing?: None 6 Click Score: 24   End of Session Nurse Communication: Mobility status  Activity Tolerance: Patient tolerated treatment well Patient left: in bed;with call bell/phone within reach  OT Visit Diagnosis: Muscle weakness (generalized) (M62.81);Pain Pain - part of body:  (neck)                Time: 9154-9096 OT Time Calculation (min): 18 min Charges:  OT General Charges $OT Visit: 1 Visit OT Evaluation $OT Eval Low Complexity: 1 Low  Elma JONETTA Lebron FREDERICK, OTR/L [facility] Acute Rehabilitation Office: [phone]   Elma JONETTA Lebron 10/02/2024, 9:18 AM

## 2024-10-03 ENCOUNTER — Encounter (HOSPITAL_COMMUNITY)

## 2024-10-05 ENCOUNTER — Encounter (HOSPITAL_COMMUNITY)

## 2024-10-05 ENCOUNTER — Other Ambulatory Visit (HOSPITAL_BASED_OUTPATIENT_CLINIC_OR_DEPARTMENT_OTHER): Payer: Self-pay

## 2024-10-05 ENCOUNTER — Encounter (HOSPITAL_COMMUNITY): Payer: Self-pay | Admitting: Neurological Surgery

## 2024-10-08 ENCOUNTER — Encounter (HOSPITAL_COMMUNITY)

## 2024-10-09 ENCOUNTER — Institutional Professional Consult (permissible substitution) (INDEPENDENT_AMBULATORY_CARE_PROVIDER_SITE_OTHER)

## 2024-10-10 ENCOUNTER — Encounter (HOSPITAL_COMMUNITY)

## 2024-10-12 ENCOUNTER — Encounter (HOSPITAL_COMMUNITY)

## 2024-10-13 ENCOUNTER — Other Ambulatory Visit (HOSPITAL_BASED_OUTPATIENT_CLINIC_OR_DEPARTMENT_OTHER): Payer: Self-pay

## 2024-10-15 ENCOUNTER — Encounter (HOSPITAL_COMMUNITY)

## 2024-10-17 ENCOUNTER — Encounter (HOSPITAL_COMMUNITY)

## 2024-10-18 ENCOUNTER — Encounter (HOSPITAL_COMMUNITY): Payer: Self-pay

## 2024-10-19 ENCOUNTER — Ambulatory Visit (HOSPITAL_COMMUNITY): Admission: RE | Admit: 2024-10-19 | Source: Ambulatory Visit

## 2024-10-19 ENCOUNTER — Encounter (HOSPITAL_COMMUNITY)

## 2024-10-19 ENCOUNTER — Other Ambulatory Visit (HOSPITAL_BASED_OUTPATIENT_CLINIC_OR_DEPARTMENT_OTHER): Payer: Self-pay

## 2024-10-22 ENCOUNTER — Encounter (HOSPITAL_COMMUNITY)

## 2024-10-23 ENCOUNTER — Other Ambulatory Visit (HOSPITAL_BASED_OUTPATIENT_CLINIC_OR_DEPARTMENT_OTHER): Payer: Self-pay

## 2024-10-23 ENCOUNTER — Telehealth: Payer: Self-pay | Admitting: Cardiology

## 2024-10-23 ENCOUNTER — Other Ambulatory Visit: Payer: Self-pay | Admitting: Cardiology

## 2024-10-23 ENCOUNTER — Other Ambulatory Visit (HOSPITAL_COMMUNITY): Payer: Self-pay

## 2024-10-23 MED ORDER — ZEPBOUND 7.5 MG/0.5ML ~~LOC~~ SOAJ
7.5000 mg | SUBCUTANEOUS | 0 refills | Status: DC
  Filled 2024-10-23: qty 2, 28d supply, fill #0

## 2024-10-23 MED ORDER — BISOPROLOL FUMARATE 2.5 MG PO TABS
1.0000 | ORAL_TABLET | Freq: Every day | ORAL | 1 refills | Status: DC
  Filled 2024-10-23 – 2024-10-24 (×2): qty 30, 30d supply, fill #0

## 2024-10-23 NOTE — Telephone Encounter (Signed)
 Pt c/o medication issue:  1. Name of Medication:   metoprolol  succinate (TOPROL  XL) 25 MG 24 hr tablet    2. How are you currently taking this medication (dosage and times per day)? As written.   3. Are you having a reaction (difficulty breathing--STAT)? no  4. What is your medication issue? Medication is causing ED. Please advise.

## 2024-10-23 NOTE — Addendum Note (Signed)
 Addended by: JOSHUA ANDREZ PARAS on: 10/23/2024 09:09 AM   Modules accepted: Orders

## 2024-10-23 NOTE — Telephone Encounter (Signed)
 Called patient - he would like to try bisprolol for short term RX to see if this elevates side effects of ED

## 2024-10-23 NOTE — Telephone Encounter (Signed)
 Zepbound  is managed by pharmD team. Refill that was sent over was cancelled, called pharmacy and left a message to ensure it was cancelled. Will Forward to pharmD team for refills.

## 2024-10-23 NOTE — Addendum Note (Signed)
 Addended by: JOSHUA ANDREZ PARAS on: 10/23/2024 09:18 AM   Modules accepted: Orders

## 2024-10-23 NOTE — Telephone Encounter (Signed)
 None since the last time he was seen in clinic

## 2024-10-24 ENCOUNTER — Other Ambulatory Visit (HOSPITAL_COMMUNITY): Payer: Self-pay

## 2024-10-24 ENCOUNTER — Encounter (HOSPITAL_COMMUNITY)

## 2024-10-24 ENCOUNTER — Other Ambulatory Visit (HOSPITAL_BASED_OUTPATIENT_CLINIC_OR_DEPARTMENT_OTHER): Payer: Self-pay

## 2024-10-24 ENCOUNTER — Ambulatory Visit (HOSPITAL_BASED_OUTPATIENT_CLINIC_OR_DEPARTMENT_OTHER): Admitting: Family Medicine

## 2024-10-24 NOTE — Telephone Encounter (Signed)
 Patient has an appointment scheduled that is upcoming will discuss and determine additional medication changes at that time.

## 2024-10-25 ENCOUNTER — Other Ambulatory Visit: Payer: Self-pay

## 2024-10-25 ENCOUNTER — Other Ambulatory Visit (HOSPITAL_BASED_OUTPATIENT_CLINIC_OR_DEPARTMENT_OTHER): Payer: Self-pay

## 2024-10-25 MED ORDER — TESTOSTERONE 20.25 MG/ACT (1.62%) TD GEL
2.0000 | Freq: Every day | TRANSDERMAL | 5 refills | Status: AC
  Filled 2024-10-25: qty 150, 30d supply, fill #0

## 2024-10-26 ENCOUNTER — Encounter: Payer: Self-pay | Admitting: Cardiology

## 2024-10-26 ENCOUNTER — Ambulatory Visit: Attending: Cardiology | Admitting: Cardiology

## 2024-10-26 ENCOUNTER — Encounter (HOSPITAL_COMMUNITY)

## 2024-10-26 ENCOUNTER — Other Ambulatory Visit (HOSPITAL_BASED_OUTPATIENT_CLINIC_OR_DEPARTMENT_OTHER): Payer: Self-pay

## 2024-10-26 VITALS — BP 148/90 | HR 88 | Ht 73.0 in | Wt 330.0 lb

## 2024-10-26 DIAGNOSIS — R002 Palpitations: Secondary | ICD-10-CM | POA: Diagnosis not present

## 2024-10-26 DIAGNOSIS — F17200 Nicotine dependence, unspecified, uncomplicated: Secondary | ICD-10-CM | POA: Diagnosis not present

## 2024-10-26 DIAGNOSIS — N529 Male erectile dysfunction, unspecified: Secondary | ICD-10-CM

## 2024-10-26 DIAGNOSIS — I5032 Chronic diastolic (congestive) heart failure: Secondary | ICD-10-CM | POA: Diagnosis not present

## 2024-10-26 DIAGNOSIS — N1832 Chronic kidney disease, stage 3b: Secondary | ICD-10-CM

## 2024-10-26 DIAGNOSIS — I2542 Coronary artery dissection: Secondary | ICD-10-CM | POA: Diagnosis not present

## 2024-10-26 DIAGNOSIS — I10 Essential (primary) hypertension: Secondary | ICD-10-CM

## 2024-10-26 DIAGNOSIS — G4733 Obstructive sleep apnea (adult) (pediatric): Secondary | ICD-10-CM | POA: Diagnosis not present

## 2024-10-26 DIAGNOSIS — I2510 Atherosclerotic heart disease of native coronary artery without angina pectoris: Secondary | ICD-10-CM

## 2024-10-26 MED ORDER — LABETALOL HCL 100 MG PO TABS
100.0000 mg | ORAL_TABLET | Freq: Two times a day (BID) | ORAL | 3 refills | Status: AC
  Filled 2024-10-26: qty 60, 30d supply, fill #0

## 2024-10-26 NOTE — Patient Instructions (Signed)
 Medication Instructions:  Your physician recommends the following medication changes.  STOP TAKING: Bisoprolol  Fumarate 2.5 mg  START TAKING: Lebetalol 100 mg twice daily  Continue all other medications as prescribed.  *If you need a refill on your cardiac medications before your next appointment, please call your pharmacy*  Lab Work: No labs ordered today  If you have labs (blood work) drawn today and your tests are completely normal, you will receive your results only by: MyChart Message (if you have MyChart) OR A paper copy in the mail If you have any lab test that is abnormal or we need to change your treatment, we will call you to review the results.  Testing/Procedures: No test ordered today   Follow-Up: At [facility], you and your health needs are our priority.  As part of our continuing mission to provide you with exceptional heart care, our providers are all part of one team.  This team includes your primary Cardiologist (physician) and Advanced Practice Providers or APPs (Physician Assistants and Nurse Practitioners) who all work together to provide you with the care you need, when you need it.  Your next appointment:   3 month(s)  Provider:   You may see Kardie Tobb, DO or one of the following Advanced Practice Providers on your designated Care Team:    Tylene Lunch, NP   We recommend signing up for the patient portal called MyChart.  Sign up information is provided on this After Visit Summary.  MyChart is used to connect with patients for Virtual Visits (Telemedicine).  Patients are able to view lab/test results, encounter notes, upcoming appointments, etc.  Non-urgent messages can be sent to your provider as well.   To learn more about what you can do with MyChart, go to [email or web address].

## 2024-10-29 ENCOUNTER — Encounter (HOSPITAL_COMMUNITY)

## 2024-10-30 ENCOUNTER — Other Ambulatory Visit: Payer: Self-pay

## 2024-10-30 ENCOUNTER — Other Ambulatory Visit (HOSPITAL_BASED_OUTPATIENT_CLINIC_OR_DEPARTMENT_OTHER): Payer: Self-pay

## 2024-10-30 ENCOUNTER — Other Ambulatory Visit: Payer: Self-pay | Admitting: *Deleted

## 2024-10-30 MED ORDER — HYDRALAZINE HCL 100 MG PO TABS
100.0000 mg | ORAL_TABLET | Freq: Three times a day (TID) | ORAL | 11 refills | Status: AC
  Filled 2024-10-30: qty 90, 30d supply, fill #0

## 2024-10-30 NOTE — Telephone Encounter (Signed)
 Discontinue BiDil . Start hydralazine  100 mg 3 times daily.  Monitor blood pressures 1 to 2 hours postmedication administration to ensure no significant elevation as blood pressures have been difficult to control.

## 2024-10-31 ENCOUNTER — Encounter (HOSPITAL_COMMUNITY)

## 2024-11-02 ENCOUNTER — Encounter (HOSPITAL_COMMUNITY)

## 2024-11-05 ENCOUNTER — Encounter (HOSPITAL_COMMUNITY)

## 2024-11-07 ENCOUNTER — Encounter (HOSPITAL_COMMUNITY)

## 2024-11-12 ENCOUNTER — Institutional Professional Consult (permissible substitution) (INDEPENDENT_AMBULATORY_CARE_PROVIDER_SITE_OTHER)

## 2024-11-28 ENCOUNTER — Ambulatory Visit (HOSPITAL_BASED_OUTPATIENT_CLINIC_OR_DEPARTMENT_OTHER): Admitting: Family Medicine

## 2024-12-18 ENCOUNTER — Ambulatory Visit: Admitting: Cardiology

## 2025-01-24 ENCOUNTER — Ambulatory Visit: Admitting: Cardiology
